# Patient Record
Sex: Female | Born: 1950
Health system: Southern US, Community
[De-identification: ages and names within clinical notes are randomized; demographics above are authoritative.]

## PROBLEM LIST (undated history)

## (undated) DIAGNOSIS — IMO0001 Reserved for inherently not codable concepts without codable children: Secondary | ICD-10-CM

## (undated) DIAGNOSIS — K5792 Diverticulitis of intestine, part unspecified, without perforation or abscess without bleeding: Secondary | ICD-10-CM

## (undated) DIAGNOSIS — M545 Low back pain, unspecified: Secondary | ICD-10-CM

## (undated) DIAGNOSIS — C9590 Leukemia, unspecified not having achieved remission: Secondary | ICD-10-CM

## (undated) DIAGNOSIS — H3530 Unspecified macular degeneration: Secondary | ICD-10-CM

## (undated) HISTORY — PX: BUNIONECTOMY: SHX129

## (undated) HISTORY — DX: Diverticulitis of intestine, part unspecified, without perforation or abscess without bleeding: K57.92

## (undated) HISTORY — DX: Unspecified macular degeneration: H35.30

## (undated) HISTORY — PX: TONSILLECTOMY: SUR1361

## (undated) HISTORY — PX: OTHER SURGICAL HISTORY: SHX169

## (undated) HISTORY — DX: Reserved for inherently not codable concepts without codable children: IMO0001

---

## 1998-02-24 DIAGNOSIS — IMO0001 Reserved for inherently not codable concepts without codable children: Secondary | ICD-10-CM

## 1998-02-24 HISTORY — DX: Reserved for inherently not codable concepts without codable children: IMO0001

## 1998-06-26 ENCOUNTER — Ambulatory Visit (HOSPITAL_COMMUNITY): Admission: RE | Admit: 1998-06-26 | Discharge: 1998-06-26 | Payer: Self-pay | Admitting: Gynecology

## 1999-03-27 ENCOUNTER — Encounter: Payer: Self-pay | Admitting: Family Medicine

## 1999-03-27 ENCOUNTER — Encounter: Admission: RE | Admit: 1999-03-27 | Discharge: 1999-03-27 | Payer: Self-pay | Admitting: Family Medicine

## 1999-04-04 ENCOUNTER — Other Ambulatory Visit: Admission: RE | Admit: 1999-04-04 | Discharge: 1999-04-04 | Payer: Self-pay | Admitting: Gynecology

## 2000-02-26 ENCOUNTER — Other Ambulatory Visit: Admission: RE | Admit: 2000-02-26 | Discharge: 2000-02-26 | Payer: Self-pay | Admitting: *Deleted

## 2000-03-30 ENCOUNTER — Encounter: Payer: Self-pay | Admitting: Gynecology

## 2000-03-30 ENCOUNTER — Encounter: Admission: RE | Admit: 2000-03-30 | Discharge: 2000-03-30 | Payer: Self-pay | Admitting: Gynecology

## 2000-04-08 ENCOUNTER — Other Ambulatory Visit: Admission: RE | Admit: 2000-04-08 | Discharge: 2000-04-08 | Payer: Self-pay | Admitting: Gynecology

## 2001-01-14 ENCOUNTER — Encounter: Payer: Self-pay | Admitting: Gynecology

## 2001-01-14 ENCOUNTER — Encounter: Admission: RE | Admit: 2001-01-14 | Discharge: 2001-01-14 | Payer: Self-pay | Admitting: Gynecology

## 2001-02-09 ENCOUNTER — Encounter: Payer: Self-pay | Admitting: Gastroenterology

## 2001-02-09 ENCOUNTER — Encounter: Admission: RE | Admit: 2001-02-09 | Discharge: 2001-02-09 | Payer: Self-pay | Admitting: Gastroenterology

## 2001-03-03 ENCOUNTER — Ambulatory Visit (HOSPITAL_COMMUNITY): Admission: RE | Admit: 2001-03-03 | Discharge: 2001-03-03 | Payer: Self-pay | Admitting: Gastroenterology

## 2001-03-03 ENCOUNTER — Encounter (INDEPENDENT_AMBULATORY_CARE_PROVIDER_SITE_OTHER): Payer: Self-pay | Admitting: Specialist

## 2001-06-01 ENCOUNTER — Encounter: Payer: Self-pay | Admitting: Family Medicine

## 2001-06-01 ENCOUNTER — Encounter: Admission: RE | Admit: 2001-06-01 | Discharge: 2001-06-01 | Payer: Self-pay | Admitting: Family Medicine

## 2002-04-18 ENCOUNTER — Other Ambulatory Visit: Admission: RE | Admit: 2002-04-18 | Discharge: 2002-04-18 | Payer: Self-pay | Admitting: Gynecology

## 2002-07-15 ENCOUNTER — Encounter: Payer: Self-pay | Admitting: Emergency Medicine

## 2002-07-15 ENCOUNTER — Encounter (INDEPENDENT_AMBULATORY_CARE_PROVIDER_SITE_OTHER): Payer: Self-pay | Admitting: Specialist

## 2002-07-15 ENCOUNTER — Inpatient Hospital Stay (HOSPITAL_COMMUNITY): Admission: EM | Admit: 2002-07-15 | Discharge: 2002-07-17 | Payer: Self-pay | Admitting: Emergency Medicine

## 2002-07-15 ENCOUNTER — Encounter (INDEPENDENT_AMBULATORY_CARE_PROVIDER_SITE_OTHER): Payer: Self-pay | Admitting: Emergency Medicine

## 2002-07-16 ENCOUNTER — Encounter: Payer: Self-pay | Admitting: Oncology

## 2003-01-12 ENCOUNTER — Encounter: Admission: RE | Admit: 2003-01-12 | Discharge: 2003-01-12 | Payer: Self-pay | Admitting: Gynecology

## 2003-07-06 ENCOUNTER — Other Ambulatory Visit: Admission: RE | Admit: 2003-07-06 | Discharge: 2003-07-06 | Payer: Self-pay | Admitting: Gynecology

## 2004-03-11 ENCOUNTER — Ambulatory Visit: Payer: Self-pay | Admitting: Oncology

## 2004-05-07 ENCOUNTER — Encounter: Admission: RE | Admit: 2004-05-07 | Discharge: 2004-05-07 | Payer: Self-pay | Admitting: Gynecology

## 2004-06-11 ENCOUNTER — Ambulatory Visit: Payer: Self-pay | Admitting: Oncology

## 2004-07-15 ENCOUNTER — Other Ambulatory Visit: Admission: RE | Admit: 2004-07-15 | Discharge: 2004-07-15 | Payer: Self-pay | Admitting: Gynecology

## 2004-09-06 ENCOUNTER — Ambulatory Visit: Payer: Self-pay | Admitting: Oncology

## 2005-04-18 ENCOUNTER — Ambulatory Visit: Payer: Self-pay | Admitting: Oncology

## 2005-05-16 ENCOUNTER — Encounter: Admission: RE | Admit: 2005-05-16 | Discharge: 2005-05-16 | Payer: Self-pay | Admitting: Oncology

## 2005-07-11 ENCOUNTER — Ambulatory Visit: Payer: Self-pay | Admitting: Oncology

## 2005-07-15 LAB — CBC WITH DIFFERENTIAL/PLATELET
Eosinophils Absolute: 0 10*3/uL (ref 0.0–0.5)
HCT: 40.2 % (ref 34.8–46.6)
LYMPH%: 87.3 % — ABNORMAL HIGH (ref 14.0–48.0)
MCV: 88.3 fL (ref 81.0–101.0)
MONO%: 3.3 % (ref 0.0–13.0)
NEUT#: 3.3 10*3/uL (ref 1.5–6.5)
NEUT%: 4.6 % — ABNORMAL LOW (ref 39.6–76.8)
Platelets: 206 10*3/uL (ref 145–400)
RBC: 4.56 10*6/uL (ref 3.70–5.32)

## 2005-07-17 ENCOUNTER — Other Ambulatory Visit: Admission: RE | Admit: 2005-07-17 | Discharge: 2005-07-17 | Payer: Self-pay | Admitting: Gynecology

## 2005-07-17 LAB — COMPREHENSIVE METABOLIC PANEL
ALT: 17 U/L (ref 0–40)
BUN: 17 mg/dL (ref 6–23)
CO2: 25 mEq/L (ref 19–32)
Calcium: 9.8 mg/dL (ref 8.4–10.5)
Chloride: 103 mEq/L (ref 96–112)
Creatinine, Ser: 0.9 mg/dL (ref 0.4–1.2)
Glucose, Bld: 136 mg/dL — ABNORMAL HIGH (ref 70–99)
Total Bilirubin: 0.4 mg/dL (ref 0.3–1.2)

## 2005-07-17 LAB — IMMUNOFIXATION ELECTROPHORESIS
IgA: 69 mg/dL (ref 68–378)
IgG (Immunoglobin G), Serum: 826 mg/dL (ref 694–1618)
IgM, Serum: 64 mg/dL (ref 60–263)
Total Protein, Serum Electrophoresis: 7.1 g/dL (ref 6.0–8.3)

## 2005-07-17 LAB — BETA 2 MICROGLOBULIN, SERUM: Beta-2 Microglobulin: 2.54 mg/L — ABNORMAL HIGH (ref 1.01–1.73)

## 2006-01-09 ENCOUNTER — Ambulatory Visit: Payer: Self-pay | Admitting: Oncology

## 2006-01-13 LAB — CBC WITH DIFFERENTIAL/PLATELET
Basophils Absolute: 0.6 10*3/uL — ABNORMAL HIGH (ref 0.0–0.1)
Eosinophils Absolute: 0.4 10*3/uL (ref 0.0–0.5)
HCT: 40 % (ref 34.8–46.6)
HGB: 12.9 g/dL (ref 11.6–15.9)
LYMPH%: 88.1 % — ABNORMAL HIGH (ref 14.0–48.0)
MCHC: 32.3 g/dL (ref 32.0–36.0)
MONO#: 4.1 10*3/uL — ABNORMAL HIGH (ref 0.1–0.9)
NEUT%: 6.8 % — ABNORMAL LOW (ref 39.6–76.8)
Platelets: 220 10*3/uL (ref 145–400)
WBC: 100.5 10*3/uL (ref 3.9–10.0)

## 2006-01-15 LAB — COMPREHENSIVE METABOLIC PANEL
Albumin: 4.7 g/dL (ref 3.5–5.2)
BUN: 14 mg/dL (ref 6–23)
CO2: 29 mEq/L (ref 19–32)
Calcium: 10.1 mg/dL (ref 8.4–10.5)
Chloride: 100 mEq/L (ref 96–112)
Creatinine, Ser: 0.74 mg/dL (ref 0.40–1.20)
Glucose, Bld: 89 mg/dL (ref 70–99)

## 2006-01-15 LAB — BETA 2 MICROGLOBULIN, SERUM: Beta-2 Microglobulin: 2.14 mg/L — ABNORMAL HIGH (ref 1.01–1.73)

## 2006-01-15 LAB — IMMUNOFIXATION ELECTROPHORESIS
IgA: 61 mg/dL — ABNORMAL LOW (ref 68–378)
IgG (Immunoglobin G), Serum: 887 mg/dL (ref 694–1618)

## 2006-04-16 ENCOUNTER — Ambulatory Visit: Payer: Self-pay | Admitting: Oncology

## 2006-04-21 LAB — MANUAL DIFFERENTIAL
Band Neutrophils: 0 % (ref 0–10)
Basophil: 0 % (ref 0–2)
EOS: 0 % (ref 0–7)
LYMPH: 95 % — ABNORMAL HIGH (ref 14–49)
MONO: 2 % (ref 0–14)
PLT EST: ADEQUATE
RBC Comments: NORMAL
nRBC: 0 % (ref 0–0)

## 2006-04-21 LAB — CHCC SMEAR

## 2006-04-21 LAB — CBC WITH DIFFERENTIAL/PLATELET
HCT: 37.4 % (ref 34.8–46.6)
MCH: 29.1 pg (ref 26.0–34.0)
MCHC: 32.9 g/dL (ref 32.0–36.0)
Platelets: 178 10*3/uL (ref 145–400)

## 2006-06-02 ENCOUNTER — Encounter: Admission: RE | Admit: 2006-06-02 | Discharge: 2006-06-02 | Payer: Self-pay | Admitting: Gynecology

## 2006-07-06 ENCOUNTER — Ambulatory Visit: Payer: Self-pay | Admitting: Oncology

## 2006-07-06 LAB — CBC WITH DIFFERENTIAL/PLATELET
BASO%: 0.8 % (ref 0.0–2.0)
EOS%: 0 % (ref 0.0–7.0)
MCH: 28.8 pg (ref 26.0–34.0)
MCHC: 32.8 g/dL (ref 32.0–36.0)
MONO%: 2.2 % (ref 0.0–13.0)
RDW: 12.1 % (ref 11.3–14.5)
lymph#: 97.1 10*3/uL — ABNORMAL HIGH (ref 0.9–3.3)

## 2006-07-06 LAB — MORPHOLOGY

## 2006-07-07 LAB — COMPREHENSIVE METABOLIC PANEL
ALT: 22 U/L (ref 0–35)
Alkaline Phosphatase: 89 U/L (ref 39–117)
Sodium: 139 mEq/L (ref 135–145)
Total Bilirubin: 0.3 mg/dL (ref 0.3–1.2)
Total Protein: 6.4 g/dL (ref 6.0–8.3)

## 2006-09-14 ENCOUNTER — Ambulatory Visit: Payer: Self-pay | Admitting: Oncology

## 2006-09-14 LAB — CBC WITH DIFFERENTIAL/PLATELET
Basophils Absolute: 4.7 10*3/uL — ABNORMAL HIGH (ref 0.0–0.1)
EOS%: 0.2 % (ref 0.0–7.0)
Eosinophils Absolute: 0.3 10*3/uL (ref 0.0–0.5)
HCT: 36.1 % (ref 34.8–46.6)
HGB: 12 g/dL (ref 11.6–15.9)
MCH: 28 pg (ref 26.0–34.0)
MCV: 84.5 fL (ref 81.0–101.0)
NEUT#: 9.1 10*3/uL — ABNORMAL HIGH (ref 1.5–6.5)
NEUT%: 6.2 % — ABNORMAL LOW (ref 39.6–76.8)
lymph#: 132 10*3/uL — ABNORMAL HIGH (ref 0.9–3.3)

## 2006-10-22 LAB — MANUAL DIFFERENTIAL
ALC: 140.4 10*3/uL — ABNORMAL HIGH (ref 0.9–3.3)
ANC (CHCC manual diff): 7.9 10*3/uL — ABNORMAL HIGH (ref 1.5–6.5)
Basophil: 0 % (ref 0–2)
Blasts: 0 % (ref 0–0)
LYMPH: 89 % — ABNORMAL HIGH (ref 14–49)
Metamyelocytes: 0 % (ref 0–0)
Myelocytes: 0 % (ref 0–0)
PROMYELO: 0 % (ref 0–0)
Variant Lymph: 0 % (ref 0–0)

## 2006-10-22 LAB — CBC WITH DIFFERENTIAL/PLATELET
HCT: 37.7 % (ref 34.8–46.6)
MCH: 28.6 pg (ref 26.0–34.0)
MCHC: 32.2 g/dL (ref 32.0–36.0)
MCV: 88.7 fL (ref 81.0–101.0)

## 2006-10-27 LAB — COMPREHENSIVE METABOLIC PANEL
Albumin: 4.6 g/dL (ref 3.5–5.2)
Alkaline Phosphatase: 95 U/L (ref 39–117)
BUN: 12 mg/dL (ref 6–23)
CO2: 26 mEq/L (ref 19–32)
Calcium: 9.2 mg/dL (ref 8.4–10.5)
Chloride: 105 mEq/L (ref 96–112)
Glucose, Bld: 77 mg/dL (ref 70–99)
Potassium: 4.2 mEq/L (ref 3.5–5.3)
Sodium: 141 mEq/L (ref 135–145)
Total Protein: 6.8 g/dL (ref 6.0–8.3)

## 2006-10-27 LAB — IMMUNOFIXATION ELECTROPHORESIS
IgA: 73 mg/dL (ref 68–378)
IgG (Immunoglobin G), Serum: 793 mg/dL (ref 694–1618)
Total Protein, Serum Electrophoresis: 6.8 g/dL (ref 6.0–8.3)

## 2006-10-27 LAB — BETA 2 MICROGLOBULIN, SERUM: Beta-2 Microglobulin: 1.8 mg/L — ABNORMAL HIGH (ref 1.01–1.73)

## 2006-10-28 ENCOUNTER — Other Ambulatory Visit: Admission: RE | Admit: 2006-10-28 | Discharge: 2006-10-28 | Payer: Self-pay | Admitting: Gynecology

## 2006-11-02 ENCOUNTER — Ambulatory Visit: Payer: Self-pay | Admitting: Oncology

## 2006-11-02 LAB — MANUAL DIFFERENTIAL
ALC: 141.1 10*3/uL — ABNORMAL HIGH (ref 0.9–3.3)
EOS: 0 % (ref 0–7)
LYMPH: 96 % — ABNORMAL HIGH (ref 14–49)
MONO: 2 % (ref 0–14)
Metamyelocytes: 0 % (ref 0–0)
Other Cell: 0 % (ref 0–0)
PLT EST: ADEQUATE
Variant Lymph: 0 % (ref 0–0)

## 2006-11-02 LAB — CBC WITH DIFFERENTIAL/PLATELET
HCT: 37.4 % (ref 34.8–46.6)
MCHC: 32.7 g/dL (ref 32.0–36.0)
MCV: 83.3 fL (ref 81.0–101.0)
Platelets: 193 10*3/uL (ref 145–400)

## 2006-11-06 LAB — MANUAL DIFFERENTIAL
ALC: 58.7 10*3/uL — ABNORMAL HIGH (ref 0.9–3.3)
ANC (CHCC manual diff): 3.9 10*3/uL (ref 1.5–6.5)
Band Neutrophils: 0 % (ref 0–10)
Blasts: 0 % (ref 0–0)
LYMPH: 90 % — ABNORMAL HIGH (ref 14–49)
Metamyelocytes: 0 % (ref 0–0)
PROMYELO: 0 % (ref 0–0)
RBC Comments: NORMAL
Variant Lymph: 0 % (ref 0–0)

## 2006-11-06 LAB — CBC WITH DIFFERENTIAL/PLATELET
HCT: 38.6 % (ref 34.8–46.6)
HGB: 13.1 g/dL (ref 11.6–15.9)
MCHC: 33.8 g/dL (ref 32.0–36.0)

## 2006-11-16 LAB — CBC WITH DIFFERENTIAL/PLATELET
Basophils Absolute: 0.4 10*3/uL — ABNORMAL HIGH (ref 0.0–0.1)
Eosinophils Absolute: 0.2 10*3/uL (ref 0.0–0.5)
HCT: 38.2 % (ref 34.8–46.6)
LYMPH%: 74 % — ABNORMAL HIGH (ref 14.0–48.0)
MCV: 84.1 fL (ref 81.0–101.0)
MONO%: 2.8 % (ref 0.0–13.0)
NEUT#: 5 10*3/uL (ref 1.5–6.5)
NEUT%: 20.6 % — ABNORMAL LOW (ref 39.6–76.8)
Platelets: 198 10*3/uL (ref 145–400)
RBC: 4.54 10*6/uL (ref 3.70–5.32)

## 2006-11-23 LAB — CBC WITH DIFFERENTIAL/PLATELET
BASO%: 2.3 % — ABNORMAL HIGH (ref 0.0–2.0)
EOS%: 1.6 % (ref 0.0–7.0)
LYMPH%: 69.2 % — ABNORMAL HIGH (ref 14.0–48.0)
MCH: 29.5 pg (ref 26.0–34.0)
MCHC: 34.4 g/dL (ref 32.0–36.0)
MCV: 85.7 fL (ref 81.0–101.0)
MONO%: 3.7 % (ref 0.0–13.0)
NEUT%: 23.2 % — ABNORMAL LOW (ref 39.6–76.8)
Platelets: 174 10*3/uL (ref 145–400)
RBC: 4.23 10*6/uL (ref 3.70–5.32)
WBC: 11.7 10*3/uL — ABNORMAL HIGH (ref 3.9–10.0)

## 2006-11-23 LAB — TECHNOLOGIST REVIEW

## 2006-11-30 LAB — CBC WITH DIFFERENTIAL/PLATELET
BASO%: 1.7 % (ref 0.0–2.0)
LYMPH%: 63.4 % — ABNORMAL HIGH (ref 14.0–48.0)
MCHC: 34.2 g/dL (ref 32.0–36.0)
MONO#: 0.6 10*3/uL (ref 0.1–0.9)
Platelets: 171 10*3/uL (ref 145–400)
RBC: 4.64 10*6/uL (ref 3.70–5.32)
WBC: 11.6 10*3/uL — ABNORMAL HIGH (ref 3.9–10.0)
lymph#: 7.4 10*3/uL — ABNORMAL HIGH (ref 0.9–3.3)

## 2006-12-07 LAB — CBC WITH DIFFERENTIAL/PLATELET
BASO%: 2.1 % — ABNORMAL HIGH (ref 0.0–2.0)
HCT: 40.8 % (ref 34.8–46.6)
MCHC: 34.8 g/dL (ref 32.0–36.0)
MONO#: 0.8 10*3/uL (ref 0.1–0.9)
NEUT%: 39.2 % — ABNORMAL LOW (ref 39.6–76.8)
WBC: 8.6 10*3/uL (ref 3.9–10.0)
lymph#: 4.1 10*3/uL — ABNORMAL HIGH (ref 0.9–3.3)

## 2007-03-29 ENCOUNTER — Ambulatory Visit: Payer: Self-pay | Admitting: Oncology

## 2007-03-31 LAB — CBC WITH DIFFERENTIAL/PLATELET
BASO%: 0.6 % (ref 0.0–2.0)
Eosinophils Absolute: 0.1 10*3/uL (ref 0.0–0.5)
MONO#: 0.5 10*3/uL (ref 0.1–0.9)
NEUT#: 4.8 10*3/uL (ref 1.5–6.5)
RBC: 4.92 10*6/uL (ref 3.70–5.32)
RDW: 11.9 % (ref 11.3–14.5)
WBC: 9.9 10*3/uL (ref 3.9–10.0)
lymph#: 4.3 10*3/uL — ABNORMAL HIGH (ref 0.9–3.3)

## 2007-04-01 LAB — COMPREHENSIVE METABOLIC PANEL
Alkaline Phosphatase: 66 U/L (ref 39–117)
Glucose, Bld: 92 mg/dL (ref 70–99)
Sodium: 140 mEq/L (ref 135–145)
Total Bilirubin: 0.5 mg/dL (ref 0.3–1.2)
Total Protein: 6.6 g/dL (ref 6.0–8.3)

## 2007-07-01 ENCOUNTER — Ambulatory Visit: Payer: Self-pay | Admitting: Oncology

## 2007-07-05 LAB — CBC WITH DIFFERENTIAL/PLATELET
Basophils Absolute: 0 10*3/uL (ref 0.0–0.1)
EOS%: 1.8 % (ref 0.0–7.0)
HCT: 38.9 % (ref 34.8–46.6)
HGB: 13.3 g/dL (ref 11.6–15.9)
LYMPH%: 50 % — ABNORMAL HIGH (ref 14.0–48.0)
MCH: 28.2 pg (ref 26.0–34.0)
NEUT%: 47.1 % (ref 39.6–76.8)
Platelets: 216 10*3/uL (ref 145–400)
lymph#: 5 10*3/uL — ABNORMAL HIGH (ref 0.9–3.3)

## 2007-07-05 LAB — MORPHOLOGY: PLT EST: ADEQUATE

## 2007-07-06 LAB — LACTATE DEHYDROGENASE: LDH: 134 U/L (ref 94–250)

## 2007-07-06 LAB — COMPREHENSIVE METABOLIC PANEL
AST: 17 U/L (ref 0–37)
BUN: 11 mg/dL (ref 6–23)
CO2: 26 mEq/L (ref 19–32)
Calcium: 9 mg/dL (ref 8.4–10.5)
Chloride: 104 mEq/L (ref 96–112)
Creatinine, Ser: 0.89 mg/dL (ref 0.40–1.20)
Glucose, Bld: 67 mg/dL — ABNORMAL LOW (ref 70–99)

## 2007-07-06 LAB — BETA 2 MICROGLOBULIN, SERUM: Beta-2 Microglobulin: 1.56 mg/L (ref 1.01–1.73)

## 2007-07-07 ENCOUNTER — Encounter: Admission: RE | Admit: 2007-07-07 | Discharge: 2007-07-07 | Payer: Self-pay | Admitting: Gynecology

## 2007-08-19 ENCOUNTER — Ambulatory Visit: Payer: Self-pay | Admitting: Oncology

## 2007-08-23 LAB — CBC WITH DIFFERENTIAL/PLATELET
BASO%: 0.6 % (ref 0.0–2.0)
Basophils Absolute: 0.1 10*3/uL (ref 0.0–0.1)
Eosinophils Absolute: 0.1 10*3/uL (ref 0.0–0.5)
HCT: 37.4 % (ref 34.8–46.6)
HGB: 13 g/dL (ref 11.6–15.9)
LYMPH%: 46.5 % (ref 14.0–48.0)
MONO#: 0.4 10*3/uL (ref 0.1–0.9)
NEUT#: 4.7 10*3/uL (ref 1.5–6.5)
NEUT%: 47.6 % (ref 39.6–76.8)
Platelets: 223 10*3/uL (ref 145–400)
WBC: 9.9 10*3/uL (ref 3.9–10.0)
lymph#: 4.6 10*3/uL — ABNORMAL HIGH (ref 0.9–3.3)

## 2007-09-28 LAB — CBC WITH DIFFERENTIAL/PLATELET
BASO%: 1 % (ref 0.0–2.0)
EOS%: 0.5 % (ref 0.0–7.0)
HCT: 39 % (ref 34.8–46.6)
LYMPH%: 42.8 % (ref 14.0–48.0)
MCH: 28.4 pg (ref 26.0–34.0)
MCHC: 34.1 g/dL (ref 32.0–36.0)
NEUT%: 52.4 % (ref 39.6–76.8)
lymph#: 4.8 10*3/uL — ABNORMAL HIGH (ref 0.9–3.3)

## 2007-09-29 LAB — COMPREHENSIVE METABOLIC PANEL
ALT: 19 U/L (ref 0–35)
AST: 20 U/L (ref 0–37)
Creatinine, Ser: 0.77 mg/dL (ref 0.40–1.20)
Total Bilirubin: 0.6 mg/dL (ref 0.3–1.2)

## 2007-09-29 LAB — URIC ACID: Uric Acid, Serum: 5.3 mg/dL (ref 2.4–7.0)

## 2007-10-05 ENCOUNTER — Ambulatory Visit: Payer: Self-pay | Admitting: Oncology

## 2008-01-03 ENCOUNTER — Ambulatory Visit: Payer: Self-pay | Admitting: Oncology

## 2008-01-05 LAB — CBC WITH DIFFERENTIAL/PLATELET
BASO%: 0.5 % (ref 0.0–2.0)
HCT: 41.1 % (ref 34.8–46.6)
MCHC: 33.4 g/dL (ref 32.0–36.0)
MONO#: 0.8 10*3/uL (ref 0.1–0.9)
NEUT%: 30 % — ABNORMAL LOW (ref 39.6–76.8)
RBC: 4.91 10*6/uL (ref 3.70–5.32)
WBC: 16.5 10*3/uL — ABNORMAL HIGH (ref 3.9–10.0)
lymph#: 10.6 10*3/uL — ABNORMAL HIGH (ref 0.9–3.3)

## 2008-01-05 LAB — MORPHOLOGY: PLT EST: ADEQUATE

## 2008-01-06 LAB — COMPREHENSIVE METABOLIC PANEL
Albumin: 4.5 g/dL (ref 3.5–5.2)
Alkaline Phosphatase: 68 U/L (ref 39–117)
CO2: 26 mEq/L (ref 19–32)
Chloride: 103 mEq/L (ref 96–112)
Glucose, Bld: 86 mg/dL (ref 70–99)
Potassium: 4.3 mEq/L (ref 3.5–5.3)
Sodium: 139 mEq/L (ref 135–145)
Total Protein: 6.7 g/dL (ref 6.0–8.3)

## 2008-01-06 LAB — BETA 2 MICROGLOBULIN, SERUM: Beta-2 Microglobulin: 1.59 mg/L (ref 1.01–1.73)

## 2008-03-28 ENCOUNTER — Ambulatory Visit: Payer: Self-pay | Admitting: Oncology

## 2008-03-30 LAB — CBC WITH DIFFERENTIAL/PLATELET
Basophils Absolute: 0.2 10*3/uL — ABNORMAL HIGH (ref 0.0–0.1)
EOS%: 1.2 % (ref 0.0–7.0)
Eosinophils Absolute: 0.3 10*3/uL (ref 0.0–0.5)
HGB: 13.2 g/dL (ref 11.6–15.9)
LYMPH%: 73.6 % — ABNORMAL HIGH (ref 14.0–48.0)
MCH: 28.7 pg (ref 26.0–34.0)
MCV: 84.3 fL (ref 81.0–101.0)
MONO%: 3.5 % (ref 0.0–13.0)
NEUT#: 4.4 10*3/uL (ref 1.5–6.5)
Platelets: 187 10*3/uL (ref 145–400)
RDW: 12.2 % (ref 11.3–14.5)

## 2008-03-30 LAB — COMPREHENSIVE METABOLIC PANEL
Alkaline Phosphatase: 71 U/L (ref 39–117)
BUN: 13 mg/dL (ref 6–23)
CO2: 29 mEq/L (ref 19–32)
Creatinine, Ser: 0.77 mg/dL (ref 0.40–1.20)
Glucose, Bld: 69 mg/dL — ABNORMAL LOW (ref 70–99)
Total Bilirubin: 0.4 mg/dL (ref 0.3–1.2)

## 2008-03-30 LAB — LACTATE DEHYDROGENASE: LDH: 135 U/L (ref 94–250)

## 2008-09-08 ENCOUNTER — Ambulatory Visit: Payer: Self-pay | Admitting: Oncology

## 2008-09-12 LAB — CBC WITH DIFFERENTIAL/PLATELET
Basophils Absolute: 0.3 10*3/uL — ABNORMAL HIGH (ref 0.0–0.1)
Eosinophils Absolute: 0.2 10*3/uL (ref 0.0–0.5)
HCT: 41.8 % (ref 34.8–46.6)
LYMPH%: 82.7 % — ABNORMAL HIGH (ref 14.0–49.7)
MCHC: 33.7 g/dL (ref 31.5–36.0)
MONO#: 0.9 10*3/uL (ref 0.1–0.9)
NEUT#: 5 10*3/uL (ref 1.5–6.5)
NEUT%: 13.8 % — ABNORMAL LOW (ref 38.4–76.8)
Platelets: 227 10*3/uL (ref 145–400)
WBC: 36.7 10*3/uL — ABNORMAL HIGH (ref 3.9–10.3)

## 2008-09-12 LAB — COMPREHENSIVE METABOLIC PANEL
BUN: 8 mg/dL (ref 6–23)
CO2: 30 mEq/L (ref 19–32)
Creatinine, Ser: 0.89 mg/dL (ref 0.40–1.20)
Glucose, Bld: 99 mg/dL (ref 70–99)
Total Bilirubin: 0.7 mg/dL (ref 0.3–1.2)

## 2008-09-12 LAB — LACTATE DEHYDROGENASE: LDH: 165 U/L (ref 94–250)

## 2008-11-13 ENCOUNTER — Emergency Department (HOSPITAL_COMMUNITY): Admission: EM | Admit: 2008-11-13 | Discharge: 2008-11-14 | Payer: Self-pay | Admitting: Emergency Medicine

## 2008-12-18 ENCOUNTER — Ambulatory Visit: Payer: Self-pay | Admitting: Oncology

## 2008-12-20 LAB — CBC WITH DIFFERENTIAL/PLATELET
Basophils Absolute: 0.2 10*3/uL — ABNORMAL HIGH (ref 0.0–0.1)
EOS%: 0.4 % (ref 0.0–7.0)
HGB: 13.3 g/dL (ref 11.6–15.9)
MCH: 28.9 pg (ref 25.1–34.0)
MCV: 88.7 fL (ref 79.5–101.0)
MONO%: 3.2 % (ref 0.0–14.0)
RBC: 4.59 10*6/uL (ref 3.70–5.45)
RDW: 12.5 % (ref 11.2–14.5)

## 2008-12-20 LAB — MORPHOLOGY

## 2009-03-22 ENCOUNTER — Ambulatory Visit: Payer: Self-pay | Admitting: Oncology

## 2009-03-22 LAB — CBC WITH DIFFERENTIAL/PLATELET
BASO%: 2 % (ref 0.0–2.0)
EOS%: 0.5 % (ref 0.0–7.0)
HGB: 13 g/dL (ref 11.6–15.9)
LYMPH%: 91 % — ABNORMAL HIGH (ref 14.0–49.7)
MCH: 29.5 pg (ref 25.1–34.0)
MCV: 89.4 fL (ref 79.5–101.0)
MONO#: 0.2 10*3/uL (ref 0.1–0.9)
NEUT#: 4.3 10*3/uL (ref 1.5–6.5)
WBC: 67.9 10*3/uL (ref 3.9–10.3)

## 2009-03-22 LAB — MORPHOLOGY
PLT EST: ADEQUATE
RBC Comments: NORMAL

## 2009-03-23 LAB — COMPREHENSIVE METABOLIC PANEL
ALT: 20 U/L (ref 0–35)
AST: 24 U/L (ref 0–37)
Albumin: 4.4 g/dL (ref 3.5–5.2)
Alkaline Phosphatase: 89 U/L (ref 39–117)
CO2: 28 mEq/L (ref 19–32)
Chloride: 103 mEq/L (ref 96–112)
Total Bilirubin: 0.3 mg/dL (ref 0.3–1.2)
Total Protein: 6.5 g/dL (ref 6.0–8.3)

## 2009-03-23 LAB — BETA 2 MICROGLOBULIN, SERUM: Beta-2 Microglobulin: 2.81 mg/L — ABNORMAL HIGH (ref 1.01–1.73)

## 2009-06-25 ENCOUNTER — Ambulatory Visit: Payer: Self-pay | Admitting: Oncology

## 2009-06-26 LAB — MANUAL DIFFERENTIAL
ANC (CHCC manual diff): 11.7 10*3/uL — ABNORMAL HIGH (ref 1.5–6.5)
EOS: 2 % (ref 0–7)
MONO: 2 % (ref 0–14)
PLT EST: ADEQUATE
PROMYELO: 0 % (ref 0–0)
Variant Lymph: 0 % (ref 0–0)
nRBC: 0 % (ref 0–0)

## 2009-06-26 LAB — CHCC SMEAR

## 2009-06-26 LAB — CBC WITH DIFFERENTIAL/PLATELET
HCT: 38.4 % (ref 34.8–46.6)
HGB: 12.5 g/dL (ref 11.6–15.9)
MCH: 28.8 pg (ref 25.1–34.0)
MCHC: 32.5 g/dL (ref 31.5–36.0)
MCV: 88.8 fL (ref 79.5–101.0)
RBC: 4.33 10*6/uL (ref 3.70–5.45)

## 2009-09-28 ENCOUNTER — Ambulatory Visit: Payer: Self-pay | Admitting: Oncology

## 2009-10-02 LAB — CBC WITH DIFFERENTIAL/PLATELET
EOS%: 0.4 % (ref 0.0–7.0)
Eosinophils Absolute: 0.4 10*3/uL (ref 0.0–0.5)
MCH: 28.9 pg (ref 25.1–34.0)
MCV: 89.6 fL (ref 79.5–101.0)
MONO#: 2.2 10*3/uL — ABNORMAL HIGH (ref 0.1–0.9)
NEUT#: 8.7 10*3/uL — ABNORMAL HIGH (ref 1.5–6.5)
NEUT%: 7 % — ABNORMAL LOW (ref 38.4–76.8)
Platelets: 254 10*3/uL (ref 145–400)
RDW: 12.8 % (ref 11.2–14.5)
lymph#: 111.9 10*3/uL — ABNORMAL HIGH (ref 0.9–3.3)

## 2009-10-02 LAB — MORPHOLOGY

## 2009-10-04 LAB — COMPREHENSIVE METABOLIC PANEL
AST: 20 U/L (ref 0–37)
Alkaline Phosphatase: 99 U/L (ref 39–117)
BUN: 18 mg/dL (ref 6–23)
CO2: 19 mEq/L (ref 19–32)
Calcium: 10.1 mg/dL (ref 8.4–10.5)
Creatinine, Ser: 0.91 mg/dL (ref 0.40–1.20)
Glucose, Bld: 89 mg/dL (ref 70–99)

## 2009-10-04 LAB — LACTATE DEHYDROGENASE: LDH: 217 U/L (ref 94–250)

## 2009-10-04 LAB — IMMUNOFIXATION ELECTROPHORESIS: IgA: 69 mg/dL (ref 68–378)

## 2009-11-20 ENCOUNTER — Ambulatory Visit: Payer: Self-pay | Admitting: Oncology

## 2009-11-20 LAB — CBC WITH DIFFERENTIAL/PLATELET
Eosinophils Absolute: 0.5 10*3/uL (ref 0.0–0.5)
HCT: 39.2 % (ref 34.8–46.6)
MONO#: 2.1 10*3/uL — ABNORMAL HIGH (ref 0.1–0.9)
MONO%: 1.8 % (ref 0.0–14.0)
NEUT#: 6.5 10*3/uL (ref 1.5–6.5)
Platelets: 197 10*3/uL (ref 145–400)
RBC: 4.4 10*6/uL (ref 3.70–5.45)
RDW: 12.3 % (ref 11.2–14.5)
lymph#: 105.9 10*3/uL — ABNORMAL HIGH (ref 0.9–3.3)

## 2009-11-20 LAB — MORPHOLOGY

## 2009-11-20 LAB — CHCC SMEAR

## 2010-01-04 ENCOUNTER — Ambulatory Visit: Payer: Self-pay | Admitting: Oncology

## 2010-01-08 LAB — CBC WITH DIFFERENTIAL/PLATELET
HCT: 37.8 % (ref 34.8–46.6)
LYMPH%: 87.6 % — ABNORMAL HIGH (ref 14.0–49.7)
MCV: 89 fL (ref 79.5–101.0)
MONO#: 0.3 10*3/uL (ref 0.1–0.9)
MONO%: 0.3 % (ref 0.0–14.0)
NEUT#: 13.1 10*3/uL — ABNORMAL HIGH (ref 1.5–6.5)
NEUT%: 11.2 % — ABNORMAL LOW (ref 38.4–76.8)
RBC: 4.24 10*6/uL (ref 3.70–5.45)
RDW: 12.4 % (ref 11.2–14.5)

## 2010-01-08 LAB — TECHNOLOGIST REVIEW

## 2010-02-11 ENCOUNTER — Ambulatory Visit: Payer: Self-pay | Admitting: Oncology

## 2010-02-12 LAB — CBC WITH DIFFERENTIAL/PLATELET
BASO%: 0.4 % (ref 0.0–2.0)
Basophils Absolute: 0.6 10*3/uL — ABNORMAL HIGH (ref 0.0–0.1)
EOS%: 0 % (ref 0.0–7.0)
Eosinophils Absolute: 0 10*3/uL (ref 0.0–0.5)
LYMPH%: 89.5 % — ABNORMAL HIGH (ref 14.0–49.7)
MCHC: 32.5 g/dL (ref 31.5–36.0)
MCV: 90.2 fL (ref 79.5–101.0)
MONO#: 5.2 10*3/uL — ABNORMAL HIGH (ref 0.1–0.9)
NEUT#: 8 10*3/uL — ABNORMAL HIGH (ref 1.5–6.5)
lymph#: 116.3 10*3/uL — ABNORMAL HIGH (ref 0.9–3.3)

## 2010-02-12 LAB — TECHNOLOGIST REVIEW

## 2010-02-26 ENCOUNTER — Ambulatory Visit (HOSPITAL_BASED_OUTPATIENT_CLINIC_OR_DEPARTMENT_OTHER): Admitting: Oncology

## 2010-02-26 LAB — CHCC SMEAR

## 2010-02-26 LAB — MANUAL DIFFERENTIAL
ALC: 120.2 10*3/uL — ABNORMAL HIGH (ref 0.9–3.3)
SEG: 6 % — ABNORMAL LOW (ref 38–77)

## 2010-02-26 LAB — CBC WITH DIFFERENTIAL/PLATELET
HCT: 38.4 % (ref 34.8–46.6)
Platelets: 201 10*3/uL (ref 145–400)

## 2010-02-27 LAB — COMPREHENSIVE METABOLIC PANEL
ALT: 13 U/L (ref 0–35)
AST: 21 U/L (ref 0–37)
Albumin: 4.3 g/dL (ref 3.5–5.2)
Alkaline Phosphatase: 85 U/L (ref 39–117)
BUN: 13 mg/dL (ref 6–23)
CO2: 24 mEq/L (ref 19–32)
Calcium: 10 mg/dL (ref 8.4–10.5)
Chloride: 104 mEq/L (ref 96–112)
Creatinine, Ser: 0.91 mg/dL (ref 0.40–1.20)
Glucose, Bld: 97 mg/dL (ref 70–99)
Potassium: 3.9 mEq/L (ref 3.5–5.3)
Sodium: 140 mEq/L (ref 135–145)
Total Bilirubin: 0.4 mg/dL (ref 0.3–1.2)
Total Protein: 6.4 g/dL (ref 6.0–8.3)

## 2010-02-27 LAB — BETA 2 MICROGLOBULIN, SERUM: Beta-2 Microglobulin: 2.87 mg/L — ABNORMAL HIGH (ref 1.01–1.73)

## 2010-02-27 LAB — LACTATE DEHYDROGENASE: LDH: 164 U/L (ref 94–250)

## 2010-03-06 LAB — MANUAL DIFFERENTIAL
ALC: 37.8 10*3/uL — ABNORMAL HIGH (ref 0.9–3.3)
ANC (CHCC manual diff): 4.4 10*3/uL (ref 1.5–6.5)
Band Neutrophils: 0 % (ref 0–10)
Basophil: 1 % (ref 0–2)
Blasts: 0 % (ref 0–0)
EOS: 2 % (ref 0–7)
LYMPH: 87 % — ABNORMAL HIGH (ref 14–49)
MONO: 0 % (ref 0–14)
Metamyelocytes: 0 % (ref 0–0)
Myelocytes: 0 % (ref 0–0)
Other Cell: 0 % (ref 0–0)
PLT EST: ADEQUATE
PROMYELO: 0 % (ref 0–0)
SEG: 10 % — ABNORMAL LOW (ref 38–77)
Variant Lymph: 0 % (ref 0–0)
nRBC: 0 % (ref 0–0)

## 2010-03-06 LAB — CBC WITH DIFFERENTIAL/PLATELET
HCT: 37.8 % (ref 34.8–46.6)
HGB: 12.5 g/dL (ref 11.6–15.9)
MCH: 28.6 pg (ref 25.1–34.0)
MCHC: 33 g/dL (ref 31.5–36.0)
MCV: 86.5 fL (ref 79.5–101.0)
Platelets: 156 10*3/uL (ref 145–400)
RBC: 4.38 10*6/uL (ref 3.70–5.45)
RDW: 12.9 % (ref 11.2–14.5)
WBC: 43.5 10*3/uL — ABNORMAL HIGH (ref 3.9–10.3)

## 2010-03-13 LAB — CBC WITH DIFFERENTIAL/PLATELET
BASO%: 0.6 % (ref 0.0–2.0)
Basophils Absolute: 0.1 10*3/uL (ref 0.0–0.1)
EOS%: 1.4 % (ref 0.0–7.0)
Eosinophils Absolute: 0.3 10*3/uL (ref 0.0–0.5)
HCT: 38.2 % (ref 34.8–46.6)
HGB: 12.8 g/dL (ref 11.6–15.9)
LYMPH%: 72.7 % — ABNORMAL HIGH (ref 14.0–49.7)
MCH: 29 pg (ref 25.1–34.0)
MCHC: 33.5 g/dL (ref 31.5–36.0)
MCV: 86.7 fL (ref 79.5–101.0)
MONO#: 0.6 10*3/uL (ref 0.1–0.9)
MONO%: 2.9 % (ref 0.0–14.0)
NEUT#: 4.6 10*3/uL (ref 1.5–6.5)
NEUT%: 22.4 % — ABNORMAL LOW (ref 38.4–76.8)
Platelets: 178 10*3/uL (ref 145–400)
RBC: 4.41 10*6/uL (ref 3.70–5.45)
RDW: 12.9 % (ref 11.2–14.5)
WBC: 20.7 10*3/uL — ABNORMAL HIGH (ref 3.9–10.3)
lymph#: 15 10*3/uL — ABNORMAL HIGH (ref 0.9–3.3)

## 2010-03-13 LAB — TECHNOLOGIST REVIEW

## 2010-03-17 ENCOUNTER — Encounter: Payer: Self-pay | Admitting: Oncology

## 2010-03-20 LAB — CBC WITH DIFFERENTIAL/PLATELET
BASO%: 0.4 % (ref 0.0–2.0)
Eosinophils Absolute: 0.2 10*3/uL (ref 0.0–0.5)
HCT: 39.4 % (ref 34.8–46.6)
HGB: 12.8 g/dL (ref 11.6–15.9)
LYMPH%: 60.9 % — ABNORMAL HIGH (ref 14.0–49.7)
MCH: 28.4 pg (ref 25.1–34.0)
MCHC: 32.5 g/dL (ref 31.5–36.0)
MONO#: 0.9 10*3/uL (ref 0.1–0.9)
MONO%: 8.3 % (ref 0.0–14.0)
NEUT#: 3 10*3/uL (ref 1.5–6.5)
NEUT%: 28.8 % — ABNORMAL LOW (ref 38.4–76.8)
Platelets: 164 10*3/uL (ref 145–400)
WBC: 10.3 10*3/uL (ref 3.9–10.3)
lymph#: 6.3 10*3/uL — ABNORMAL HIGH (ref 0.9–3.3)
nRBC: 0 % (ref 0–0)

## 2010-03-27 ENCOUNTER — Ambulatory Visit (HOSPITAL_BASED_OUTPATIENT_CLINIC_OR_DEPARTMENT_OTHER): Admitting: Oncology

## 2010-03-27 DIAGNOSIS — Z5112 Encounter for antineoplastic immunotherapy: Secondary | ICD-10-CM

## 2010-03-27 DIAGNOSIS — C9110 Chronic lymphocytic leukemia of B-cell type not having achieved remission: Secondary | ICD-10-CM

## 2010-03-27 DIAGNOSIS — C8599 Non-Hodgkin lymphoma, unspecified, extranodal and solid organ sites: Secondary | ICD-10-CM

## 2010-03-27 LAB — CBC WITH DIFFERENTIAL/PLATELET
Eosinophils Absolute: 0.1 10*3/uL (ref 0.0–0.5)
LYMPH%: 44.1 % (ref 14.0–49.7)
MCV: 86.6 fL (ref 79.5–101.0)
Platelets: 157 10*3/uL (ref 145–400)
RBC: 4.47 10*6/uL (ref 3.70–5.45)
RDW: 13 % (ref 11.2–14.5)
WBC: 7.3 10*3/uL (ref 3.9–10.3)
nRBC: 0 % (ref 0–0)

## 2010-04-03 ENCOUNTER — Encounter (HOSPITAL_BASED_OUTPATIENT_CLINIC_OR_DEPARTMENT_OTHER): Admitting: Oncology

## 2010-04-03 DIAGNOSIS — C9110 Chronic lymphocytic leukemia of B-cell type not having achieved remission: Secondary | ICD-10-CM

## 2010-04-03 DIAGNOSIS — C8599 Non-Hodgkin lymphoma, unspecified, extranodal and solid organ sites: Secondary | ICD-10-CM

## 2010-04-03 DIAGNOSIS — Z5112 Encounter for antineoplastic immunotherapy: Secondary | ICD-10-CM

## 2010-04-10 ENCOUNTER — Other Ambulatory Visit: Payer: Self-pay | Admitting: Oncology

## 2010-04-10 ENCOUNTER — Encounter (HOSPITAL_BASED_OUTPATIENT_CLINIC_OR_DEPARTMENT_OTHER): Admitting: Oncology

## 2010-04-10 DIAGNOSIS — C9110 Chronic lymphocytic leukemia of B-cell type not having achieved remission: Secondary | ICD-10-CM

## 2010-04-10 DIAGNOSIS — C8599 Non-Hodgkin lymphoma, unspecified, extranodal and solid organ sites: Secondary | ICD-10-CM

## 2010-04-10 DIAGNOSIS — Z5112 Encounter for antineoplastic immunotherapy: Secondary | ICD-10-CM

## 2010-04-10 LAB — CBC WITH DIFFERENTIAL/PLATELET
BASO%: 0.5 % (ref 0.0–2.0)
EOS%: 1.1 % (ref 0.0–7.0)
Eosinophils Absolute: 0.1 10*3/uL (ref 0.0–0.5)
HCT: 38.5 % (ref 34.8–46.6)
MCH: 28.8 pg (ref 25.1–34.0)
MCHC: 33.4 g/dL (ref 31.5–36.0)
MCV: 86.2 fL (ref 79.5–101.0)
MONO#: 0.6 10*3/uL (ref 0.1–0.9)
NEUT#: 4.2 10*3/uL (ref 1.5–6.5)
NEUT%: 47.9 % (ref 38.4–76.8)
WBC: 8.8 10*3/uL (ref 3.9–10.3)

## 2010-04-17 ENCOUNTER — Encounter (HOSPITAL_BASED_OUTPATIENT_CLINIC_OR_DEPARTMENT_OTHER): Admitting: Oncology

## 2010-04-17 ENCOUNTER — Other Ambulatory Visit: Payer: Self-pay | Admitting: Oncology

## 2010-04-17 DIAGNOSIS — C9110 Chronic lymphocytic leukemia of B-cell type not having achieved remission: Secondary | ICD-10-CM

## 2010-04-17 DIAGNOSIS — Z5112 Encounter for antineoplastic immunotherapy: Secondary | ICD-10-CM

## 2010-04-17 DIAGNOSIS — C8599 Non-Hodgkin lymphoma, unspecified, extranodal and solid organ sites: Secondary | ICD-10-CM

## 2010-04-17 LAB — CBC WITH DIFFERENTIAL/PLATELET
BASO%: 1 % (ref 0.0–2.0)
Basophils Absolute: 0.1 10*3/uL (ref 0.0–0.1)
Eosinophils Absolute: 0.1 10*3/uL (ref 0.0–0.5)
HGB: 13.1 g/dL (ref 11.6–15.9)
LYMPH%: 50 % — ABNORMAL HIGH (ref 14.0–49.7)
MONO%: 9.4 % (ref 0.0–14.0)
RDW: 12.7 % (ref 11.2–14.5)
nRBC: 0 % (ref 0–0)

## 2010-05-31 LAB — DIFFERENTIAL
Basophils Absolute: 0 10*3/uL (ref 0.0–0.1)
Basophils Relative: 0 % (ref 0–1)
Lymphocytes Relative: 82 % — ABNORMAL HIGH (ref 12–46)
Lymphs Abs: 34.6 10*3/uL — ABNORMAL HIGH (ref 0.7–4.0)
Monocytes Absolute: 1.7 10*3/uL — ABNORMAL HIGH (ref 0.1–1.0)
Neutro Abs: 5.5 10*3/uL (ref 1.7–7.7)

## 2010-05-31 LAB — BASIC METABOLIC PANEL
BUN: 9 mg/dL (ref 6–23)
CO2: 29 mEq/L (ref 19–32)
Chloride: 104 mEq/L (ref 96–112)
GFR calc Af Amer: >60 mL/min (ref >60)
GFR calc non Af Amer: >60 mL/min (ref >60)
Glucose, Bld: 83 mg/dL (ref 70–99)
Potassium: 3.8 mEq/L (ref 3.5–5.1)
Sodium: 139 mEq/L (ref 135–145)

## 2010-05-31 LAB — CBC
HCT: 40.9 % (ref 36.0–46.0)
Hemoglobin: 13.4 g/dL (ref 12.0–15.0)
WBC: 42.2 10*3/uL — ABNORMAL HIGH (ref 4.0–10.5)

## 2010-05-31 LAB — RAPID URINE DRUG SCREEN, HOSP PERFORMED
Amphetamines: NOT DETECTED
Barbiturates: NOT DETECTED
Cocaine: NOT DETECTED
Tetrahydrocannabinol: NOT DETECTED

## 2010-05-31 LAB — ETHANOL: Alcohol, Ethyl (B): 43 mg/dL — ABNORMAL HIGH (ref 0–10)

## 2010-06-17 ENCOUNTER — Encounter (HOSPITAL_BASED_OUTPATIENT_CLINIC_OR_DEPARTMENT_OTHER): Admitting: Oncology

## 2010-06-17 ENCOUNTER — Other Ambulatory Visit: Payer: Self-pay | Admitting: Oncology

## 2010-06-17 DIAGNOSIS — Z5112 Encounter for antineoplastic immunotherapy: Secondary | ICD-10-CM

## 2010-06-17 DIAGNOSIS — C9110 Chronic lymphocytic leukemia of B-cell type not having achieved remission: Secondary | ICD-10-CM

## 2010-06-17 DIAGNOSIS — C8599 Non-Hodgkin lymphoma, unspecified, extranodal and solid organ sites: Secondary | ICD-10-CM

## 2010-06-17 LAB — MORPHOLOGY
PLT EST: ADEQUATE
RBC Comments: NORMAL

## 2010-06-17 LAB — CBC WITH DIFFERENTIAL/PLATELET
Basophils Absolute: 0.1 10*3/uL (ref 0.0–0.1)
Eosinophils Absolute: 0.1 10*3/uL (ref 0.0–0.5)
HGB: 14.1 g/dL (ref 11.6–15.9)
LYMPH%: 50.1 % — ABNORMAL HIGH (ref 14.0–49.7)
MCV: 85.5 fL (ref 79.5–101.0)
MONO#: 0.5 10*3/uL (ref 0.1–0.9)
NEUT#: 4.3 10*3/uL (ref 1.5–6.5)
Platelets: 205 10*3/uL (ref 145–400)
RBC: 4.96 10*6/uL (ref 3.70–5.45)
WBC: 9.9 10*3/uL (ref 3.9–10.3)

## 2010-06-17 LAB — CHCC SMEAR

## 2010-07-12 NOTE — H&P (Signed)
NAMECHANEKA, TREFZ                       ACCOUNT NO.:  [id number]   MEDICAL RECORD NO.:  [id number]                   PATIENT TYPE:  EMS   LOCATION:  ED                                   FACILITY:  [facility]   PHYSICIAN:  Leighton Roach. Truett Perna, M.D.              DATE OF BIRTH:  May 14, 1950   DATE OF ADMISSION:  07/15/2002  DATE OF DISCHARGE:                                HISTORY & PHYSICAL   PATIENT IDENTIFICATION:  Ms. 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 She is now admitted with a headache, skin lesions, and  a CSF leukocytosis.   HISTORY OF PRESENT ILLNESS:  Ms. Mayfield states that beginning on May 19  she noted the onset of chills, bone/muscle aches, and a bite over the  upper right buttock. She also developed a consistent headache.   The symptoms persisted today and she saw Dr. Darnelle Catalan in the office. He felt  that the lesion at the buttock was likely related to a spider bite. She  was referred to the emergency room for a lumbar puncture.   She states that she feels better today than she did yesterday. In addition  to the headache, she noted neck pain yesterday. She denies fever.   She is not aware of a specific insect exposure. She has been exposed to a  grandchild who was recently diagnosed with roseola.   PAST MEDICAL HISTORY:  1. Degenerative arthritis of the low spine.  2. G3, P3.   PAST SURGICAL HISTORY:  Status post uterine ablation procedure several years  ago.   ALLERGIES:  PENICILLIN.   CURRENT MEDICATIONS:  1. Vitamin E daily.  2. Calcium daily.  3. Estrogen patch.  4. Centrum daily.  5. Allegra D.   FAMILY HISTORY:  Noncontributory.   SOCIAL HISTORY:  She lives with her husband in [place]. She is a  homemaker and keeps her grandchildren.   REVIEW OF SYMPTOMS:  CONSTITUTIONAL:  No fever or night sweats.  RESPIRATORY:  She has developed a cough over the past few days. She denies  dyspnea. GU:  Negative.  GI:  Negative.   SKIN:  As per HPI.  She has noted  several bites over the face today. NEUROLOGIC:  Negative aside from the  headache.   PHYSICAL EXAMINATION:  VITAL SIGNS:  Temperature 98.9, pressure 102/48,  pulse 72, respirations 20. Oxygen saturation is 98% on room air.  HEENT:  Sclera anicteric. Oropharynx without thrush or ulcerations.  NECK:  Without palpable masses. The neck is supple.  LUNGS:  Clear.  CARDIAC:  Regular rate and rhythm. No gallops.  ABDOMEN:  Soft. Nontender. I could not feel the liver or spleen.  LYMPH NODES:  There is a 2 cm right posterior cervical node, less than 1 cm  left posterior cervical nodes, and a 1-2 cm right inguinal node.  EXTREMITIES:  No edema.  SKIN:  There is an erythematous indurated area  measuring approximately 2-3  cm in transverse dimension at the right upper buttock with central 1/2 cm  purpuric/black eschar. There are 2-3 raised erythematous 2 cm lesions over  the face.   LABORATORY DATA:  Hemoglobin 12.8, platelets 169,000, white count 84.7, ANC  7.6, potassium 3.8, BUN 7, 0.8, total protein 6.7, albumin 4, bilirubin 0.6,  CSF tube #1, 20 red cells, 1500 white cells, 96% lymphocytes. Tube #4, 2 red  cells, 1150 white cells, 99% lymphocytes.   IMPRESSION/PLAN:  Ms. Masters is a 60 year old with chronic lymphocytic  leukemia.   She presents today with a headache, a right upper buttock skin lesion, and  there is a CSF lymphocytosis.   She is immunocompromised secondary to the CLL. It is not clear whether the  skin lesions and CSF lymphocytosis are related.   The differential diagnosis includes skin lesions secondary to an insect  bite, or an infectious process.   She will be admitted for broad spectrum intravenous antibiotics with  ceftriaxone. We will obtain a CSF culture for bacteria, virus, and we will  obtain a CSF cryptococcal antigen. We will send the CSF for cytology.   We will consider an infectious disease consult and skin biopsy  pending her  status over the next few days.                                               Leighton Roach Truett Perna, M.D.    GBS/MEDQ  D:  07/15/2002  T:  07/15/2002  Job:  [id number]   cc:   Valentino Hue. Magrinat, M.D.  [address] [facility]  [place]  [place] [address]  Fax: [phone]   Brett Canales A. Cleta Alberts, M.D.  [address]  [place]  [place] [address]  Fax: [phone]

## 2010-07-12 NOTE — Procedures (Signed)
[facility]  Patient:    Rachel [name], Rachel [name] Visit Number: [id number] MRN: [id number]          Service Type: END Location: ENDO Attending Physician:  Rich Brave Dictated by:   Florencia Reasons, M.D. Proc. Date: 03/03/01 Admit Date:  03/03/2001   CC:         Valentino Hue. Magrinat, M.D.  Chales Salmon. Abigail Miyamoto, M.D.   Procedure Report  PROCEDURE:  Upper endoscopy with biopsies.  INDICATIONS:  60 year old female with intermittent diarrhea and periodic epigastric pain.  FINDINGS:  Normal exam.  PROCEDURE:  The nature, purpose, and risks of the procedure had been discussed with the patient and provided written consent.  SEDATION:  Fentanyl 37.5 mcg and Versed 5 mg IV without arrhythmias or desaturation.  PROCEDURE:  The Olympus video endoscope was passed under direct vision.  The vocal cords looked normal.  The esophagus was readily entered and had entirely normal mucosa without evidence of reflux esophagitis, Barretts esophagus, varices, infection, or neoplasia.  No free reflux, hiatal hernia, ring, or stricture were appreciated.  The stomach was entered.  It contained no significant residual and had unremarkable mucosa with perhaps just a little bit of gastric mucosal atrophy in the fundus of the stomach.  No gastritis, erosions, ulcers, polyps, masses, or masses were observed.  Retroflexed viewing showed a normal cardia without evidence of hiatal hernia.  The pylorus, duodenal bulb, and second duodenum looked normal.  Duodenal biopsies were obtained prior to removal of the scope because of the history of intermittent loose stools.  The patient tolerated the procedure well, and there were no apparent complications.  IMPRESSION:  Normal upper endoscopy.  PLAN:  Await pathology on biopsies.  Proceed to colonoscopic evaluation. Dictated by:   Florencia Reasons, M.D. Attending Physician:  Rich Brave DD:  03/03/01 TD:   03/03/01 Job: [id number]

## 2010-07-12 NOTE — Op Note (Signed)
[facility]  Patient:    Rachel [name], Rachel [name] Visit Number: [id number] MRN: [id number]          Service Type: END Location: ENDO Attending Physician:  Rich Brave Dictated by:   Florencia Reasons, M.D. Proc. Date: 03/03/01 Admit Date:  03/03/2001   CC:         Valentino Hue. Magrinat, M.D.  Chales Salmon. Abigail Miyamoto, M.D.   Operative Report  PROCEDURE:  Colonoscopy with biopsies.  INDICATION:  This is a 59 year old for colon cancer screening who also has a history of intermittent loose stools. She has a history of CLL.  FINDINGS:  Normal exam except some right side diverticulosis.  DESCRIPTION OF PROCEDURE:  The nature, purpose, and risks of the procedure have been discussed with the patient who provided written consent. Sedation for this procedure and the upper endoscopy which preceded it, a total of Fentanyl 50 mcg and Versed 6 mg IV without arrhythmias or desaturation. The Olympus Pediatric adjustable tension video colonoscope was advanced to the terminal ileum without significant difficulty and pullback was then performed. The terminal ileum had been entered for about a distance of perhaps 15 cm. Several biopsies were obtained prior of it prior to pullback.  The quality of the prep in the colon was very good and it is felt that essentially all areas were well seen.  There was a little bit of right-sided colonic diverticulosis but this was otherwise a normal examination, without evidence of polyps, cancer, colitis, or vascular malformations. Retroflexion in the rectum was normal except for perhaps some small hypertrophied anal papillae.  Random mucosal biopsies of the colon were obtained during pullback.  The patient tolerated the procedure well and there were no apparent complications.  IMPRESSION:  Normal colonoscopy except some right side diverticulosis.  PLAN:  Await pathology on biopsies. Dictated by:   Florencia Reasons,  M.D. Attending Physician:  Rich Brave DD:  03/03/01 TD:  03/03/01 Job: [id number]

## 2010-07-12 NOTE — Discharge Summary (Signed)
Rachel [name], Rachel [name]                       ACCOUNT NO.:  [id number]   MEDICAL RECORD NO.:  [id number]                   PATIENT TYPE:  INP   LOCATION:  0284                                 FACILITY:  [facility]   PHYSICIAN:  Valentino Hue. Magrinat, M.D.            DATE OF BIRTH:  1950/11/13   DATE OF ADMISSION:  07/15/2002  DATE OF DISCHARGE:  07/17/2002                                 DISCHARGE SUMMARY   DISCHARGE DIAGNOSES:  1. Meningitis (?).  2. Chronic lymphoid leukemia.  3. Spider bite.   PROCEDURE:  1. Lumbar punctures.  2. MRI of the brain.  3. CT scan of the brain.   HOSPITAL COURSE:  The patient initially saw Dr. Earl Lites who felt she  might be having shingles in the setting of her CLL and sent her to our  office for further evaluation. We noted, what most likely is a spider bite  in the sacral area, but in addition the patient was having mild headaches  and mild neck stiffness, which could be consistent with meningitis.  Accordingly, she was sent to the emergency room where a lumbar puncture was  performed. This had a glucose of 40 with the lower normal being 43 and a  protein of 200 with the upper normal being 45. There were 20 red cells and  1500 white cells, 96% of which were lymphocytes, 4% were monocytes. There  were no organisms seen and culture remains negative at the time of  discharge.   The patient was admitted and started on Ceftriaxone, Diflucan and Acyclovir.  A CT of the brain was obtained which was unremarkable. MRI of the brain  showed subtle changes, possibly consistent with meningitis. The patient  never had a fever and her vital signs remained in the normal range. By the  time of discharge, she is ambulatory. Her headache is well controlled on  Tylenol. There is minimal photophobia, minimal to no neck stiffness and the  neurologic examination is nonfocal.   LABORATORY DATA:  Other lab work this admission includes a white cell count  of 84.7,  hemoglobin 12.8 and platelets 169,000, this being essentially  stable in this patient with known CLL. Her CMET was completely normal  including a creatinine of 0.7, normal liver function tests and normal  electrolytes.   A literature search found that lymphomatous meningitis secondary to CLL is  exceedingly rare. In patient's with early CLL, as in Rachel [name]'s case, I  could only find one case report. In addition, the differential between  infectious meningitis and CLL meningitis by cytology, is complex and at  least one article suggests that the only two cases suggestive of CLL  involvement were false positives.   At this point the, it is difficult for [place] to know whether what we are seeing  in terms of the lymphocytosis and the CSF is:  a.  Contamination at the time of the lumbar puncture from  a small amount of  blood.  b.  Pleocytosis secondary to viral meningitis, which is what I think it is.  c.  Pleocytosis secondary to her spider bite, which is quite close to her  spine at the midline sacral area.  d.  A true lymphomatous meningitis, which as noted above would be  exceedingly rare and indeed, a reportable case.   DISPOSITION:  With the patient improved, the plan is to send her home with  an additional five days of antibiotics and then to try to resolve this  issue, we will bring her peripheral count down with Rituxan and then repeat  a lumbar puncture. The patient is very agreeable to this but she is planning  to go to [place] between June 4th and June 11th as so, we will start her  treatment on June 11th.  Meanwhile, cytology remains pending at this point,  from the lumbar puncture.   DISCHARGE MEDICATIONS:  She will be on Diflucan 100 mg daily for five days  and Cipro 500 mg twice a day for five days and then any other medicines she  was on (multivitamins, etc) at her discretion. For pain, she will take  Tylenol or Motrin.   ACTIVITY:  Not restricted.   DIET:  Not  restricted.   WOUND CARE:  Not applicable.   SPECIAL INSTRUCTIONS:  She will call the office for fever, worsening  headache, or any other problems.   FOLLOW UP:  She will call the office for an appointment on August 05, 2002 and  she will take Rituxan the same day.                                               Valentino Hue. Magrinat, M.D.    Ronna Polio  D:  07/17/2002  T:  07/17/2002  Job:  [id number]   cc:   Brett Canales A. Cleta Alberts, M.D.  [address]  [place]  [place] [address]  Fax: [phone]

## 2010-09-02 ENCOUNTER — Encounter: Admitting: Oncology

## 2010-10-09 ENCOUNTER — Encounter (HOSPITAL_BASED_OUTPATIENT_CLINIC_OR_DEPARTMENT_OTHER): Admitting: Oncology

## 2010-10-09 ENCOUNTER — Other Ambulatory Visit: Payer: Self-pay | Admitting: Oncology

## 2010-10-09 DIAGNOSIS — C8599 Non-Hodgkin lymphoma, unspecified, extranodal and solid organ sites: Secondary | ICD-10-CM

## 2010-10-09 DIAGNOSIS — C9110 Chronic lymphocytic leukemia of B-cell type not having achieved remission: Secondary | ICD-10-CM

## 2010-10-09 DIAGNOSIS — Z5112 Encounter for antineoplastic immunotherapy: Secondary | ICD-10-CM

## 2010-10-09 LAB — CBC WITH DIFFERENTIAL/PLATELET
BASO%: 0.6 % (ref 0.0–2.0)
EOS%: 1.4 % (ref 0.0–7.0)
MCH: 29.9 pg (ref 25.1–34.0)
MCHC: 34.9 g/dL (ref 31.5–36.0)
MONO#: 0.3 10*3/uL (ref 0.1–0.9)
RDW: 12.4 % (ref 11.2–14.5)
WBC: 13.8 10*3/uL — ABNORMAL HIGH (ref 3.9–10.3)
lymph#: 8.7 10*3/uL — ABNORMAL HIGH (ref 0.9–3.3)

## 2010-10-09 LAB — MORPHOLOGY: RBC Comments: NORMAL

## 2010-10-09 LAB — CHCC SMEAR

## 2010-12-17 ENCOUNTER — Other Ambulatory Visit: Payer: Self-pay | Admitting: Oncology

## 2010-12-17 ENCOUNTER — Encounter (HOSPITAL_BASED_OUTPATIENT_CLINIC_OR_DEPARTMENT_OTHER): Admitting: Oncology

## 2010-12-17 DIAGNOSIS — C9110 Chronic lymphocytic leukemia of B-cell type not having achieved remission: Secondary | ICD-10-CM

## 2010-12-17 DIAGNOSIS — C8599 Non-Hodgkin lymphoma, unspecified, extranodal and solid organ sites: Secondary | ICD-10-CM

## 2010-12-17 DIAGNOSIS — Z5112 Encounter for antineoplastic immunotherapy: Secondary | ICD-10-CM

## 2010-12-17 LAB — CBC WITH DIFFERENTIAL/PLATELET
Basophils Absolute: 0.1 10*3/uL (ref 0.0–0.1)
Eosinophils Absolute: 0.2 10*3/uL (ref 0.0–0.5)
HCT: 40.6 % (ref 34.8–46.6)
HGB: 13.7 g/dL (ref 11.6–15.9)
MONO#: 1.2 10*3/uL — ABNORMAL HIGH (ref 0.1–0.9)
NEUT#: 4.9 10*3/uL (ref 1.5–6.5)
NEUT%: 27.3 % — ABNORMAL LOW (ref 38.4–76.8)
WBC: 17.8 10*3/uL — ABNORMAL HIGH (ref 3.9–10.3)
lymph#: 11.5 10*3/uL — ABNORMAL HIGH (ref 0.9–3.3)

## 2010-12-17 LAB — COMPREHENSIVE METABOLIC PANEL WITH GFR
ALT: 16 U/L (ref 0–35)
AST: 18 U/L (ref 0–37)
Albumin: 4.5 g/dL (ref 3.5–5.2)
Alkaline Phosphatase: 69 U/L (ref 39–117)
BUN: 14 mg/dL (ref 6–23)
CO2: 26 meq/L (ref 19–32)
Calcium: 9.3 mg/dL (ref 8.4–10.5)
Chloride: 103 meq/L (ref 96–112)
Creatinine, Ser: 0.94 mg/dL (ref 0.50–1.10)
Glucose, Bld: 100 mg/dL — ABNORMAL HIGH (ref 70–99)
Potassium: 4.1 meq/L (ref 3.5–5.3)
Sodium: 139 meq/L (ref 135–145)
Total Bilirubin: 0.4 mg/dL (ref 0.3–1.2)
Total Protein: 6.3 g/dL (ref 6.0–8.3)

## 2010-12-17 LAB — URIC ACID: Uric Acid, Serum: 5.1 mg/dL (ref 2.4–7.0)

## 2010-12-17 LAB — MORPHOLOGY
PLT EST: ADEQUATE
RBC Comments: NORMAL

## 2010-12-17 LAB — CHCC SMEAR

## 2011-02-06 ENCOUNTER — Other Ambulatory Visit: Payer: Self-pay

## 2011-02-06 ENCOUNTER — Telehealth: Payer: Self-pay

## 2011-02-06 DIAGNOSIS — C9110 Chronic lymphocytic leukemia of B-cell type not having achieved remission: Secondary | ICD-10-CM

## 2011-02-06 NOTE — Telephone Encounter (Signed)
Received call from pt requesting lab appt in January.   Per Mosaiq, pt should be scheduled for q54month labs.   Note sent to scheduling.  dph

## 2011-02-06 NOTE — Telephone Encounter (Signed)
Note to scheduling. dph

## 2011-02-10 ENCOUNTER — Telehealth: Payer: Self-pay | Admitting: Oncology

## 2011-02-10 NOTE — Telephone Encounter (Signed)
Talked to pt gave her appt for 03/10/11

## 2011-02-11 ENCOUNTER — Other Ambulatory Visit: Payer: Self-pay | Admitting: *Deleted

## 2011-02-11 DIAGNOSIS — C9110 Chronic lymphocytic leukemia of B-cell type not having achieved remission: Secondary | ICD-10-CM

## 2011-02-23 ENCOUNTER — Telehealth: Payer: Self-pay | Admitting: Oncology

## 2011-02-23 NOTE — Telephone Encounter (Signed)
S/w the pt and she is aware of her April 2013 appts

## 2011-03-10 ENCOUNTER — Other Ambulatory Visit (HOSPITAL_BASED_OUTPATIENT_CLINIC_OR_DEPARTMENT_OTHER): Admitting: Lab

## 2011-03-10 DIAGNOSIS — C9110 Chronic lymphocytic leukemia of B-cell type not having achieved remission: Secondary | ICD-10-CM

## 2011-03-10 LAB — CBC WITH DIFFERENTIAL/PLATELET
Basophils Absolute: 0.1 10*3/uL (ref 0.0–0.1)
EOS%: 0.9 % (ref 0.0–7.0)
HGB: 13.1 g/dL (ref 11.6–15.9)
MCH: 29.3 pg (ref 25.1–34.0)
MCV: 86.9 fL (ref 79.5–101.0)
MONO%: 1.1 % (ref 0.0–14.0)
RDW: 11.8 % (ref 11.2–14.5)

## 2011-03-10 LAB — MORPHOLOGY: PLT EST: ADEQUATE

## 2011-04-25 ENCOUNTER — Telehealth: Payer: Self-pay | Admitting: *Deleted

## 2011-04-25 NOTE — Telephone Encounter (Signed)
PT. IS IN [place] FOR A MONTH. WHAT SHOULD SHE DO? NOTIFIED DR.MAGRINAT'S NURSE, VAL DODD,RN. SHE WILL CALL PT.

## 2011-06-16 ENCOUNTER — Telehealth: Payer: Self-pay | Admitting: Oncology

## 2011-06-16 NOTE — Telephone Encounter (Signed)
Pt called in requesting to cancel her April appts due to dr Darnelle Catalan is not in the network. Per pt she has spoke with val regarding a referral. Per pt she will cb to r/s

## 2011-06-17 ENCOUNTER — Other Ambulatory Visit: Payer: Self-pay | Admitting: Lab

## 2011-06-17 ENCOUNTER — Ambulatory Visit: Payer: Self-pay | Admitting: Oncology

## 2011-07-03 ENCOUNTER — Ambulatory Visit: Admitting: Oncology

## 2011-07-03 ENCOUNTER — Other Ambulatory Visit: Admitting: Lab

## 2011-07-17 ENCOUNTER — Other Ambulatory Visit (HOSPITAL_BASED_OUTPATIENT_CLINIC_OR_DEPARTMENT_OTHER): Admitting: Lab

## 2011-07-17 ENCOUNTER — Ambulatory Visit (HOSPITAL_BASED_OUTPATIENT_CLINIC_OR_DEPARTMENT_OTHER): Admitting: Oncology

## 2011-07-17 ENCOUNTER — Telehealth: Payer: Self-pay | Admitting: Oncology

## 2011-07-17 VITALS — BP 106/70 | HR 80 | Temp 98.2°F | Ht 58.5 in | Wt 122.5 lb

## 2011-07-17 DIAGNOSIS — C9110 Chronic lymphocytic leukemia of B-cell type not having achieved remission: Secondary | ICD-10-CM

## 2011-07-17 DIAGNOSIS — C8599 Non-Hodgkin lymphoma, unspecified, extranodal and solid organ sites: Secondary | ICD-10-CM

## 2011-07-17 LAB — CBC WITH DIFFERENTIAL/PLATELET
BASO%: 0.8 % (ref 0.0–2.0)
EOS%: 0.6 % (ref 0.0–7.0)
HCT: 40 % (ref 34.8–46.6)
LYMPH%: 87.4 % — ABNORMAL HIGH (ref 14.0–49.7)
MCH: 28.9 pg (ref 25.1–34.0)
MCHC: 32.5 g/dL (ref 31.5–36.0)
MONO%: 0.8 % (ref 0.0–14.0)
NEUT%: 10.4 % — ABNORMAL LOW (ref 38.4–76.8)
Platelets: 217 10*3/uL (ref 145–400)
lymph#: 51.4 10*3/uL — ABNORMAL HIGH (ref 0.9–3.3)

## 2011-07-17 LAB — CHCC SMEAR

## 2011-07-17 NOTE — Telephone Encounter (Signed)
gve the pt her July 2013 appt calendar °

## 2011-07-17 NOTE — Progress Notes (Signed)
ID: Rachel [name]   DOB: July 27, 1950  MR#: [id number]  QIO#:[id number]  HISTORY OF PRESENT ILLNESS:  INTERVAL HISTORY: Rachel [name] returns today for followup of her chronic lymphoid leukemia. The family issues have come to a head and she and her husband are continuing to live in the same house but more as friends then as a married couple. She is very much a piece with this decision. She is still going to the gym regularly  REVIEW OF SYSTEMS: She is having problems with hot flashes him a little bit of a running nose, and once every month or so she has a day when she has a few loose bowel movements. She doesn't take anything to treat that, it resolves on its own. She does have some urine leakage symptoms which are not new, some anxiety and depression but, which is actually improved, and some easy bruising. Otherwise a detailed review of systems today was negative and in particular she gives me know "B." symptoms.  PAST MEDICAL HISTORY: No past medical history on file.  PAST SURGICAL HISTORY: No past surgical history on file.  FAMILY HISTORY No family history on file.  GYNECOLOGIC HISTORY:  SOCIAL HISTORY: Her husband Rachel [name] is retired from [employer]. Her son Rachel [name] lives in [place] and has 3 sons he works for a Education officer, environmental. Son 53 well lives in [place] has one child and another one on the way. Son Rachel [name] lives in [place] and has a daughter, as well as 2 sons by marriage.   ADVANCED DIRECTIVES:  HEALTH MAINTENANCE: History  Substance Use Topics  . Smoking status: Not on file  . Smokeless tobacco: Not on file  . Alcohol Use: Not on file     Colonoscopy: due  PAP: December 2012/ Rachel [name]  Bone density: Due  Lipid panel: per Dr Rachel [name]  Allergies not on file  No current outpatient prescriptions on file.    OBJECTIVE: Middle-aged white woman in no acute distress Filed Vitals:   07/17/11 1457  BP: 106/70  Pulse: 80  Temp: 98.2 F (36.8 C)     Body mass index is 25.17  kg/(m^2).    ECOG FS: 0  Sclerae unicteric Oropharynx clear She has minimal right cervical and left axillary adenopathy. Lungs no rales or rhonchi Heart regular rate and rhythm Abd benign, no organomegaly MSK no focal spinal tenderness, no peripheral edema Neuro: nonfocal   LAB RESULTS: Lab Results  Component Value Date   WBC 58.8* 07/17/2011   NEUTROABS 6.1 07/17/2011   HGB 13.0 07/17/2011   HCT 40.0 07/17/2011   MCV 89.0 07/17/2011   PLT 217 07/17/2011      Chemistry      Component Value Date/Time   NA 139 12/17/2010 1359   K 4.1 12/17/2010 1359   CL 103 12/17/2010 1359   CO2 26 12/17/2010 1359   BUN 14 12/17/2010 1359   CREATININE 0.94 12/17/2010 1359      Component Value Date/Time   CALCIUM 9.3 12/17/2010 1359   ALKPHOS 69 12/17/2010 1359   AST 18 12/17/2010 1359   ALT 16 12/17/2010 1359   BILITOT 0.4 12/17/2010 1359       No results found for this basename: LABCA2    No components found with this basename: LABCA125    No results found for this basename: INR:1;PROTIME:1 in the last 168 hours  Urinalysis No results found for this basename: colorurine, appearanceur, labspec, phurine, glucoseu, hgbur, bilirubinur, ketonesur, proteinur, urobilinogen, nitrite, leukocytesur    STUDIES:  Mammography 06/30/2011 showed some microcalcifications which are probably benign. They will be repeating a mammogram in 6 months.  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   PLAN: Her lymphocyte count has bumped up significantly in the last few months. We are going to follow in a little bit more closely, namely we will repeat lab work in 6 weeks and then she will see me again in 12 weeks. She has no anemia from the cytopenia or other symptoms that  might push [place] towards treatment at present, but I suspect she will may well require therapy before the end of the year.   Shaneese Tait C    07/17/2011

## 2011-07-29 ENCOUNTER — Telehealth: Payer: Self-pay | Admitting: *Deleted

## 2011-09-09 ENCOUNTER — Other Ambulatory Visit (HOSPITAL_BASED_OUTPATIENT_CLINIC_OR_DEPARTMENT_OTHER): Admitting: Lab

## 2011-09-09 ENCOUNTER — Ambulatory Visit (HOSPITAL_BASED_OUTPATIENT_CLINIC_OR_DEPARTMENT_OTHER): Admitting: Oncology

## 2011-09-09 VITALS — BP 98/63 | HR 59 | Temp 98.4°F | Ht 58.5 in | Wt 121.1 lb

## 2011-09-09 DIAGNOSIS — R51 Headache: Secondary | ICD-10-CM

## 2011-09-09 DIAGNOSIS — R599 Enlarged lymph nodes, unspecified: Secondary | ICD-10-CM

## 2011-09-09 DIAGNOSIS — C8599 Non-Hodgkin lymphoma, unspecified, extranodal and solid organ sites: Secondary | ICD-10-CM

## 2011-09-09 DIAGNOSIS — C9110 Chronic lymphocytic leukemia of B-cell type not having achieved remission: Secondary | ICD-10-CM

## 2011-09-09 LAB — CBC WITH DIFFERENTIAL/PLATELET
Basophils Absolute: 0.5 10*3/uL — ABNORMAL HIGH (ref 0.0–0.1)
EOS%: 0.5 % (ref 0.0–7.0)
Eosinophils Absolute: 0.3 10*3/uL (ref 0.0–0.5)
HGB: 12.7 g/dL (ref 11.6–15.9)
MCH: 28.2 pg (ref 25.1–34.0)
NEUT#: 6 10*3/uL (ref 1.5–6.5)
RBC: 4.5 10*6/uL (ref 3.70–5.45)
RDW: 12.5 % (ref 11.2–14.5)
lymph#: 67.8 10*3/uL — ABNORMAL HIGH (ref 0.9–3.3)

## 2011-09-09 MED ORDER — SUMATRIPTAN SUCCINATE 50 MG PO TABS: 50.0000 mg | ORAL_TABLET | ORAL | Status: DC | PRN

## 2011-09-09 NOTE — Progress Notes (Signed)
ID: Rachel [name]   DOB: [birth year]  MR#: [id number]  GMW#:[id number]  HISTORY OF PRESENT ILLNESS:  INTERVAL HISTORY: Rachel [name] returns today with her husband Rachel [name] for followup of her chronic lymphoid leukemia.   REVIEW OF SYSTEMS: She has been having fairly constant headaches. This started in the back of her neck and can go to the crown of her head. They're not accompanied by any visual changes, nausea or vomiting. Separately she is having more problems with hot flashes. She keeps an upset stomach. There has been no vomiting however. She describes herself as fatigue, but is able to get to the gym perhaps 4 times a week. She keeps a little bit of a runny nose, but she takes antihistamines daily of, which helps a little. She has diarrhea once or twice a week. She has some arthritic pain here and there but she describes herself is anxious and depressed. There have been no fevers, rash, or bleeding. A detailed review of systems was otherwise noncontributory.  PAST MEDICAL HISTORY: No past medical history on file.  PAST SURGICAL HISTORY: No past surgical history on file.  FAMILY HISTORY No family history on file.  GYNECOLOGIC HISTORY:  SOCIAL HISTORY: Her husband Rachel [name] is retired from [employer]. Her son Rachel [name] lives in [place] and has 3 sons; he works for a Education officer, environmental. A second son lives in [place] has two children. Son Rachel [name] lives in [place] and has a daughter, as well as 2 sons by marriage.   ADVANCED DIRECTIVES:  HEALTH MAINTENANCE: History  Substance Use Topics  . Smoking status: Not on file  . Smokeless tobacco: Not on file  . Alcohol Use: Not on file     Colonoscopy: due  PAP: December 2012/ [name]  Bone density: Due  Lipid panel: per Dr Zachery [name]  No Known Allergies  No current outpatient prescriptions on file.    OBJECTIVE: Middle-aged white woman who complains of a severe headache Filed Vitals:   09/09/11 1511  BP: 98/63  Pulse: 59  Temp: 98.4 F  (36.9 [name])     Body mass index is 24.88 kg/(m^2).    ECOG FS: 0  Sclerae unicteric Oropharynx clear She has mild right cervical and left axillary adenopathy. Lungs no rales or rhonchi Heart regular rate and rhythm Abd benign, no organomegaly MSK no focal spinal tenderness, no peripheral edema Neuro: nonfocal   LAB RESULTS: Lab Results  Component Value Date   WBC 75.2* 09/09/2011   NEUTROABS 6.0 09/09/2011   HGB 12.7 09/09/2011   HCT 40.0 09/09/2011   MCV 88.9 09/09/2011   PLT 192 09/09/2011      Chemistry      Component Value Date/Time   NA 139 12/17/2010 1359   K 4.1 12/17/2010 1359   CL 103 12/17/2010 1359   CO2 26 12/17/2010 1359   BUN 14 12/17/2010 1359   CREATININE 0.94 12/17/2010 1359      Component Value Date/Time   CALCIUM 9.3 12/17/2010 1359   ALKPHOS 69 12/17/2010 1359   AST 18 12/17/2010 1359   ALT 16 12/17/2010 1359   BILITOT 0.4 12/17/2010 1359       No results found for this basename: LABCA2    No components found with this basename: LABCA125    No results found for this basename: INR:1;PROTIME:1 in the last 168 hours  Urinalysis No results found for this basename: colorurine,  appearanceur,  labspec,  phurine,  glucoseu,  hgbur,  bilirubinur,  ketonesur,  proteinur,  urobilinogen,  nitrite,  leukocytesur    STUDIES: Mammography 06/30/2011 showed some microcalcifications which are probably benign. They will be repeating a mammogram in 6 months.  ASSESSMENT: 61 y.o.  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   PLAN: Her lymphocyte count continues to rise, but there is still no anemia or thrombocytopenia. Her adenopathy is mild to moderate. She has some fatigue but it is difficult to know whether this is due to her incredible amount of stress  of secondary to her marital issues or out to the lymphoma itself. I certainly think the headache is going to be related to the stress and not do anything else.  Rachel [name] wanted to know what to do. I suggested that they really needed to decide if they wanted to stay together as a couple or not. If they do, they have a lot of work to do and they had previously met with a psychiatrist in [place], Rachel [name], and perhaps they could start by going back there. Rachel [name] would prefer a woman counselor. Perhaps Rachel [name] works with a women counselors that she could see as well.  I did write a prescription for Imitrex for Rachel [name] and asked her to try it next time she has a headache. I also suggested she try yoga and other relatives station and stretching type exercises. If those strategies don't work and she can't make had way on the stress at home, then of I will be glad to refer her to the headache clinic.  We will recheck her labwork in 6 weeks and she will see me again in 12 my expectation is that sometime in the next few months we will have to start the anti-CD20 antibodies.   Rachel [name]    09/09/2011

## 2011-09-10 NOTE — Telephone Encounter (Signed)
No inquiry noted 

## 2011-09-11 LAB — PROTEIN ELECTROPHORESIS, SERUM
Albumin ELP: 68 % — ABNORMAL HIGH (ref 55.8–66.1)
Beta 2: 3.4 % (ref 3.2–6.5)
Beta Globulin: 6 % (ref 4.7–7.2)
Gamma Globulin: 9.5 % — ABNORMAL LOW (ref 11.1–18.8)

## 2011-09-11 LAB — COMPREHENSIVE METABOLIC PANEL
AST: 22 U/L (ref 0–37)
Albumin: 4.5 g/dL (ref 3.5–5.2)
BUN: 14 mg/dL (ref 6–23)
Calcium: 9.1 mg/dL (ref 8.4–10.5)
Chloride: 105 mEq/L (ref 96–112)
Potassium: 3.7 mEq/L (ref 3.5–5.3)
Sodium: 139 mEq/L (ref 135–145)
Total Protein: 6.3 g/dL (ref 6.0–8.3)

## 2011-10-09 ENCOUNTER — Other Ambulatory Visit (HOSPITAL_BASED_OUTPATIENT_CLINIC_OR_DEPARTMENT_OTHER): Admitting: Lab

## 2011-10-09 DIAGNOSIS — C9110 Chronic lymphocytic leukemia of B-cell type not having achieved remission: Secondary | ICD-10-CM

## 2011-10-09 LAB — CBC WITH DIFFERENTIAL/PLATELET
BASO%: 0.8 % (ref 0.0–2.0)
EOS%: 0.4 % (ref 0.0–7.0)
LYMPH%: 90.9 % — ABNORMAL HIGH (ref 14.0–49.7)
MCH: 28.6 pg (ref 25.1–34.0)
MCHC: 31.8 g/dL (ref 31.5–36.0)
MCV: 90.2 fL (ref 79.5–101.0)
MONO%: 0.5 % (ref 0.0–14.0)
NEUT#: 6.1 10*3/uL (ref 1.5–6.5)
Platelets: 203 10*3/uL (ref 145–400)
RBC: 4.36 10*6/uL (ref 3.70–5.45)
RDW: 12.4 % (ref 11.2–14.5)

## 2011-10-09 LAB — TECHNOLOGIST REVIEW

## 2011-10-22 ENCOUNTER — Telehealth: Payer: Self-pay | Admitting: *Deleted

## 2011-10-22 ENCOUNTER — Other Ambulatory Visit: Payer: Self-pay | Admitting: *Deleted

## 2011-10-22 NOTE — Telephone Encounter (Signed)
Per called to state she would like to proceed to have an appointment at headache clinic as discussed by MD at last visit.  This RN placed [facility] at [phone].  Onc Tx Schedule sent to scheduling.

## 2011-10-23 ENCOUNTER — Telehealth: Payer: Self-pay | Admitting: Oncology

## 2011-10-23 NOTE — Telephone Encounter (Signed)
S/w tina from the headache center and she has faxed over a referral form for [place] to fill out and once she receives the documents from medical records they will send [place] the information over with the pt's appt and they will call the pt with an appt. Called [phone].

## 2011-10-25 ENCOUNTER — Telehealth: Payer: Self-pay | Admitting: Oncology

## 2011-10-25 NOTE — Telephone Encounter (Signed)
gve the referral form to the desk nurse to fill out

## 2011-12-02 ENCOUNTER — Other Ambulatory Visit: Payer: Self-pay | Admitting: *Deleted

## 2011-12-02 DIAGNOSIS — C9110 Chronic lymphocytic leukemia of B-cell type not having achieved remission: Secondary | ICD-10-CM

## 2011-12-03 ENCOUNTER — Other Ambulatory Visit (HOSPITAL_BASED_OUTPATIENT_CLINIC_OR_DEPARTMENT_OTHER): Admitting: Lab

## 2011-12-03 DIAGNOSIS — C9110 Chronic lymphocytic leukemia of B-cell type not having achieved remission: Secondary | ICD-10-CM

## 2011-12-03 LAB — MORPHOLOGY: PLT EST: ADEQUATE

## 2011-12-03 LAB — CBC WITH DIFFERENTIAL/PLATELET
Eosinophils Absolute: 0.3 10*3/uL (ref 0.0–0.5)
MONO#: 0.8 10*3/uL (ref 0.1–0.9)
NEUT#: 6 10*3/uL (ref 1.5–6.5)
RBC: 4.17 10*6/uL (ref 3.70–5.45)
RDW: 12.9 % (ref 11.2–14.5)
WBC: 123 10*3/uL (ref 3.9–10.3)

## 2011-12-04 ENCOUNTER — Other Ambulatory Visit: Payer: Self-pay | Admitting: *Deleted

## 2011-12-04 NOTE — Progress Notes (Signed)
This RN received call from pt stating " I would like to go ahead and start treatment asap due to my white count going up so high ".  Per discussion pt stated she would prefer to be treated on Tuesdays but will receive 1st treatment on Wed 10/16 post MD visit.  Discussed above with MD who requested appt for treatment for Rituxin.  Onc Tx schedule sent to scheduling.

## 2011-12-05 ENCOUNTER — Telehealth: Payer: Self-pay | Admitting: Oncology

## 2011-12-05 ENCOUNTER — Telehealth: Payer: Self-pay | Admitting: *Deleted

## 2011-12-05 NOTE — Telephone Encounter (Signed)
Per staff message and POF I have scheduled appts.  JMW  

## 2011-12-05 NOTE — Telephone Encounter (Signed)
S/w the pt's husband and he is aware of the tx appt that has been added to her appt with dr Darnelle Catalan in oct.

## 2011-12-06 ENCOUNTER — Other Ambulatory Visit: Payer: Self-pay | Admitting: Oncology

## 2011-12-06 DIAGNOSIS — C9110 Chronic lymphocytic leukemia of B-cell type not having achieved remission: Secondary | ICD-10-CM

## 2011-12-10 ENCOUNTER — Ambulatory Visit (HOSPITAL_BASED_OUTPATIENT_CLINIC_OR_DEPARTMENT_OTHER): Admitting: Oncology

## 2011-12-10 ENCOUNTER — Telehealth: Payer: Self-pay | Admitting: Oncology

## 2011-12-10 ENCOUNTER — Telehealth: Payer: Self-pay | Admitting: *Deleted

## 2011-12-10 ENCOUNTER — Ambulatory Visit (HOSPITAL_BASED_OUTPATIENT_CLINIC_OR_DEPARTMENT_OTHER)

## 2011-12-10 ENCOUNTER — Other Ambulatory Visit: Admitting: Lab

## 2011-12-10 VITALS — BP 90/55 | HR 74 | Temp 98.0°F | Resp 20 | Ht 58.5 in | Wt 122.6 lb

## 2011-12-10 VITALS — BP 97/53 | HR 63 | Temp 98.5°F | Resp 24

## 2011-12-10 DIAGNOSIS — Z5112 Encounter for antineoplastic immunotherapy: Secondary | ICD-10-CM

## 2011-12-10 DIAGNOSIS — L299 Pruritus, unspecified: Secondary | ICD-10-CM

## 2011-12-10 DIAGNOSIS — C9110 Chronic lymphocytic leukemia of B-cell type not having achieved remission: Secondary | ICD-10-CM

## 2011-12-10 DIAGNOSIS — F341 Dysthymic disorder: Secondary | ICD-10-CM

## 2011-12-10 MED ORDER — SODIUM CHLORIDE 0.9 % IV SOLN
Freq: Once | INTRAVENOUS | Status: AC
  Administered 2011-12-10: 10:00:00 via INTRAVENOUS

## 2011-12-10 MED ORDER — ACYCLOVIR 400 MG PO TABS: 400.0000 mg | ORAL_TABLET | Freq: Two times a day (BID) | ORAL | Status: DC

## 2011-12-10 MED ORDER — ALLOPURINOL 300 MG PO TABS: 300.0000 mg | ORAL_TABLET | Freq: Every day | ORAL | Status: DC

## 2011-12-10 MED ORDER — LORAZEPAM 1 MG PO TABS
0.5000 mg | ORAL_TABLET | Freq: Once | ORAL | Status: AC
  Administered 2011-12-10: 0.5 mg via ORAL

## 2011-12-10 MED ORDER — DIPHENHYDRAMINE HCL 50 MG/ML IJ SOLN
25.0000 mg | Freq: Once | INTRAMUSCULAR | Status: AC | PRN
  Administered 2011-12-10: 25 mg via INTRAVENOUS

## 2011-12-10 MED ORDER — SODIUM CHLORIDE 0.45 % IV SOLN
INTRAVENOUS | Status: AC
  Administered 2011-12-10: 10:00:00 via INTRAVENOUS
  Filled 2011-12-10: qty 1000

## 2011-12-10 MED ORDER — DIPHENHYDRAMINE HCL 25 MG PO CAPS
25.0000 mg | ORAL_CAPSULE | Freq: Once | ORAL | Status: AC
  Administered 2011-12-10: 25 mg via ORAL

## 2011-12-10 MED ORDER — SODIUM CHLORIDE 0.9 % IV SOLN
300.0000 mg | Freq: Once | INTRAVENOUS | Status: AC
  Administered 2011-12-10: 300 mg via INTRAVENOUS
  Filled 2011-12-10: qty 15

## 2011-12-10 MED ORDER — ACETAMINOPHEN 500 MG PO TABS
1000.0000 mg | ORAL_TABLET | Freq: Once | ORAL | Status: AC
  Administered 2011-12-10: 1000 mg via ORAL

## 2011-12-10 MED ORDER — SULFAMETHOXAZOLE-TRIMETHOPRIM 800-160 MG PO TABS: 1.0000 | ORAL_TABLET | ORAL | Status: DC

## 2011-12-10 MED ORDER — METHYLPREDNISOLONE SODIUM SUCC 125 MG IJ SOLR
125.0000 mg | Freq: Once | INTRAMUSCULAR | Status: AC
  Administered 2011-12-10: 125 mg via INTRAVENOUS

## 2011-12-10 NOTE — Telephone Encounter (Signed)
Per patient request, I have moved all appt to Tuesday. JWM

## 2011-12-10 NOTE — Progress Notes (Signed)
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 She was alarmed by the bump in her total white cells, and of course there has also been a drop in her hemoglobin. She is here today to start her ofatumumab treatment.  REVIEW OF SYSTEMS: Family problems continue as before. As a result she is somewhat depressed, sleeps poorly, and feels more tired. Just a little bit of blurred vision, a sore throat, some sinus problems which she frequently gets this time of year, heartburn, occasional problems with loose bowel movements, easy bruising, rare headaches, and some forgetfulness and anxiety. Hot flashes are mild. She has not had drenching sweats, fevers, or rash. There has not been significant weight loss. A detailed review of systems is otherwise stable.  PAST MEDICAL HISTORY: No past medical history on file.  PAST SURGICAL HISTORY: No past surgical history on file.  FAMILY HISTORY No family history on file.  GYNECOLOGIC HISTORY:  SOCIAL HISTORY: Her husband [name] is retired from [employer]. Her son [name] lives in [place] and has 3 sons; he works for a Education officer, environmental. A second son lives in [place] has two children. Son [name] lives in [place] and has a daughter, as well as 2 sons by marriage.   ADVANCED DIRECTIVES:  HEALTH MAINTENANCE: History  Substance Use Topics  . Smoking status: Not on file  . Smokeless tobacco: Not on file  . Alcohol Use: Not on file     Colonoscopy: due  PAP: December 2012/ [name]  Bone density: Due  Lipid panel: per Dr Zachery Dauer  Allergies  Allergen Reactions  . Penicillins     "drops my heartbeat" "I just pass out"    Current Outpatient Prescriptions  Medication Sig Dispense Refill  . aspirin 81 MG tablet Take 81 mg by mouth daily.      . Calcium Citrate (CITRACAL PO) Take 4 tablets by mouth.      .  Cholecalciferol (VITAMIN D-3 PO) Take 4,000 Int'l Units by mouth.      . Estradiol (ELESTRIN) 0.52 MG/0.87 GM (0.06%) GEL Apply topically at bedtime.      . Progesterone Micronized (PROMETRIUM PO) Take 200 mg by mouth. 1st - 12th day of every other month      . traZODone (DESYREL) 50 MG tablet Take 50 mg by mouth at bedtime.      Marland Kitchen acyclovir (ZOVIRAX) 400 MG tablet Take 1 tablet (400 mg total) by mouth 2 (two) times daily.  60 tablet  12  . allopurinol (ZYLOPRIM) 300 MG tablet Take 1 tablet (300 mg total) by mouth daily.  30 tablet  4  . sulfamethoxazole-trimethoprim (BACTRIM DS,SEPTRA DS) 800-160 MG per tablet Take 1 tablet by mouth 3 (three) times a week.  30 tablet  4  . SUMAtriptan (IMITREX) 50 MG tablet Take 1 tablet (50 mg total) by mouth every 3 (three) hours as needed for migraine.  5 tablet  0    OBJECTIVE: Middle-aged white woman in no acute distress Filed Vitals:   12/10/11 0857  BP: 90/55  Pulse: 74  Temp: 98 F (36.7 C)  Resp: 20     Body mass index is 25.19 kg/(m^2).    ECOG FS: 0  Sclerae unicteric Oropharynx clear She has mild right cervical and left axillary adenopathy. Lungs no rales or rhonchi Heart regular rate and rhythm Abd benign, no organomegaly MSK  no focal spinal tenderness, no peripheral edema Neuro: nonfocal   LAB RESULTS: Lab Results  Component Value Date   WBC 123.0* 12/03/2011   NEUTROABS 6.0 12/03/2011   HGB 11.8 12/03/2011   HCT 38.8 12/03/2011   MCV 93.1 12/03/2011   PLT 176 12/03/2011      Chemistry      Component Value Date/Time   NA 139 09/09/2011 1404   K 3.7 09/09/2011 1404   CL 105 09/09/2011 1404   CO2 29 09/09/2011 1404   BUN 14 09/09/2011 1404   CREATININE 0.79 09/09/2011 1404      Component Value Date/Time   CALCIUM 9.1 09/09/2011 1404   ALKPHOS 72 09/09/2011 1404   AST 22 09/09/2011 1404   ALT 11 09/09/2011 1404   BILITOT 0.3 09/09/2011 1404       No results found for this basename: LABCA2    No components found with this  basename: LABCA125    No results found for this basename: INR:1;PROTIME:1 in the last 168 hours  Urinalysis No results found for this basename: colorurine,  appearanceur,  labspec,  phurine,  glucoseu,  hgbur,  bilirubinur,  ketonesur,  proteinur,  urobilinogen,  nitrite,  leukocytesur    STUDIES: Mammography 06/30/2011 showed some microcalcifications which are probably benign. They will be repeating a mammogram in 6 months.  ASSESSMENT: 61 y.o.  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   PLAN: We are ready to start treatment with an CD 20 antibody and she is well aware of the possible toxicities, side effects, and complications. I have written her a prescription for allopurinol to take daily, acyclovir to take twice daily, and Septra DS to take Mondays, Wednesdays, and Fridays. The plan is for 8 doses of ofatumumab. We will follow her CBC on a weekly basis. She knows to call for any problems that may develop before the next visit.  Rebekka Lobello C    12/10/2011

## 2011-12-10 NOTE — Progress Notes (Signed)
Arzerra restarted at 1750.  VS BP 97/53, HR 63, temp 98.5.  TKF  Per Dr. Darnelle Catalan, ok to stop infusion at 730 pm if no other patients in infusion at that time.  Patient agreed.    Pt discharged to home with husband.  Instructions given to patient and husband: mild reaction at home - take Benadryl according to mfg instructions.  If that does not clear the rx OR reaction is critical in nature (eg - shortness of breath) go to ED immediately.  Pt and husband verbalized understanding.

## 2011-12-10 NOTE — Telephone Encounter (Signed)
x

## 2011-12-10 NOTE — Patient Instructions (Addendum)
[facility] Discharge Instructions for Patients Receiving Chemotherapy  Today you received the following chemotherapy agents ofatumumab  To help prevent nausea and vomiting after your treatment, we encourage you to take your nausea medication if needed Begin taking it at 6pm and take it as often as prescribed for the next 48 hours if needed.   If you develop nausea and vomiting that is not controlled by your nausea medication, call the clinic. If it is after clinic hours your family physician or the after hours number for the clinic or go to the Emergency Department.   BELOW ARE SYMPTOMS THAT SHOULD BE REPORTED IMMEDIATELY:  *FEVER GREATER THAN 100.5 F  *CHILLS WITH OR WITHOUT FEVER  NAUSEA AND VOMITING THAT IS NOT CONTROLLED WITH YOUR NAUSEA MEDICATION  *UNUSUAL SHORTNESS OF BREATH  *UNUSUAL BRUISING OR BLEEDING  TENDERNESS IN MOUTH AND THROAT WITH OR WITHOUT PRESENCE OF ULCERS  *URINARY PROBLEMS  *BOWEL PROBLEMS  UNUSUAL RASH Items with * indicate a potential emergency and should be followed up as soon as possible.  One of the nurses will contact you 24 hours after your treatment. Please let the nurse know about any problems that you may have experienced. Feel free to call the clinic you have any questions or concerns. The clinic phone number is [phone].   I have been informed and understand all the instructions given to me. I know to contact the clinic, my physician, or go to the Emergency Department if any problems should occur. I do not have any questions at this time, but understand that I may call the clinic during office hours   should I have any questions or need assistance in obtaining follow up care.    __________________________________________  _____________  __________ Signature of Patient or Authorized Representative            Date                   Time    __________________________________________ Nurse's Signature

## 2011-12-10 NOTE — Progress Notes (Signed)
1350 c/o nausea, temp 99.1 stopped arezerra and spoke with amy berry. Gave ativan 0.5 sl. Restarted arzerra at 1420. 1530 c/o itching around L bra area, rash noted. Stopped arzerra and loosened bra. Rash disipated and arzerra restarted at 1545 1650 c/o itching in front of R ear, macular rash present. arzerra stopped and NS hung. Dr Darnelle Catalan informed. 25 Mg benadryl given ivp.

## 2011-12-10 NOTE — Progress Notes (Signed)
At around 17:10hr, pt. developed hives to rt. side of face and itching scalp.  Pt. denied any respiratory distress.  Azerra stopped.  Dr. Darnelle Catalan notified and okay to give another dose of Benadryl 25mg  IV and wait until symptoms improved then rechallenged.  Goal is to complete infusion today.  Per Dr. Darnelle Catalan, we may discontinue early if patient becomes the last patient left in infusion.  Will discuss scheduling with Dr. Darnelle Catalan for next tmt.

## 2011-12-11 ENCOUNTER — Telehealth: Payer: Self-pay | Admitting: *Deleted

## 2011-12-11 NOTE — Telephone Encounter (Signed)
Called patient for chemo follow up after reaction to first time Azerra. Patient states she has done fine since leaving yesterday, only awakened with a headache, did not take any meds and this has resolved. Noted patient appts, did check with Dr Darnelle Catalan, keep the rest of scheduled appts as planned, no need to see before treatment.  Informed patient we would call her back if this changed. Instructed patient to call for any problems.

## 2011-12-11 NOTE — Telephone Encounter (Signed)
Patient called and left a message to call her back regarding her appts. Per patient she neds to be in treatment room by 745. I have called her back and spoke with her husband. Patietn will have to have lab done day prior to treatment. Husband will have her call me back.  JMW

## 2011-12-12 ENCOUNTER — Telehealth: Payer: Self-pay | Admitting: *Deleted

## 2011-12-12 NOTE — Telephone Encounter (Signed)
Message left my pt stating she is concerned due to " been having problems that must be related to the chemo I had ". Marjory stated occurrences of waking up at least 5 times during the night with noted visual changes -"I was seeing like a light , and then like a circle and when I got up to go to the bathroom it was like lines that a child draws ".  This RN returned call to pt and was informed pt not at home and was presently at the gym in her yoga class. Pt will return home at 1pm.

## 2011-12-15 ENCOUNTER — Telehealth: Payer: Self-pay | Admitting: *Deleted

## 2011-12-15 ENCOUNTER — Other Ambulatory Visit: Payer: Self-pay | Admitting: Oncology

## 2011-12-15 ENCOUNTER — Other Ambulatory Visit (HOSPITAL_BASED_OUTPATIENT_CLINIC_OR_DEPARTMENT_OTHER): Admitting: Lab

## 2011-12-15 DIAGNOSIS — C9110 Chronic lymphocytic leukemia of B-cell type not having achieved remission: Secondary | ICD-10-CM

## 2011-12-15 LAB — COMPREHENSIVE METABOLIC PANEL (CC13)
ALT: 21 U/L (ref 0–55)
Alkaline Phosphatase: 104 U/L (ref 40–150)
CO2: 26 mEq/L (ref 22–29)
Creatinine: 1.1 mg/dL (ref 0.6–1.1)
Glucose: 83 mg/dl (ref 70–99)
Sodium: 138 mEq/L (ref 136–145)
Total Bilirubin: 0.3 mg/dL (ref 0.20–1.20)
Total Protein: 6.5 g/dL (ref 6.4–8.3)

## 2011-12-15 LAB — CBC WITH DIFFERENTIAL/PLATELET
BASO%: 0.6 % (ref 0.0–2.0)
HCT: 39 % (ref 34.8–46.6)
LYMPH%: 93.9 % — ABNORMAL HIGH (ref 14.0–49.7)
MCHC: 32.1 g/dL (ref 31.5–36.0)
MCV: 91.5 fL (ref 79.5–101.0)
MONO%: 0.4 % (ref 0.0–14.0)
NEUT%: 4.7 % — ABNORMAL LOW (ref 38.4–76.8)
Platelets: 184 10*3/uL (ref 145–400)
RBC: 4.26 10*6/uL (ref 3.70–5.45)

## 2011-12-15 LAB — IGG, IGA, IGM
IgA: 69 mg/dL (ref 69–380)
IgG (Immunoglobin G), Serum: 586 mg/dL — ABNORMAL LOW (ref 690–1700)

## 2011-12-15 LAB — TECHNOLOGIST REVIEW

## 2011-12-15 NOTE — Telephone Encounter (Signed)
Per patient voicemail I have moved her appt from 730 to 745 tomorrow. JMW

## 2011-12-16 ENCOUNTER — Ambulatory Visit (HOSPITAL_BASED_OUTPATIENT_CLINIC_OR_DEPARTMENT_OTHER)

## 2011-12-16 ENCOUNTER — Other Ambulatory Visit: Admitting: Lab

## 2011-12-16 ENCOUNTER — Ambulatory Visit

## 2011-12-16 VITALS — BP 88/56 | HR 61 | Temp 97.8°F | Resp 18

## 2011-12-16 DIAGNOSIS — C9110 Chronic lymphocytic leukemia of B-cell type not having achieved remission: Secondary | ICD-10-CM

## 2011-12-16 DIAGNOSIS — Z5112 Encounter for antineoplastic immunotherapy: Secondary | ICD-10-CM

## 2011-12-16 MED ORDER — DIPHENHYDRAMINE HCL 25 MG PO CAPS
25.0000 mg | ORAL_CAPSULE | Freq: Once | ORAL | Status: AC
  Administered 2011-12-16: 25 mg via ORAL

## 2011-12-16 MED ORDER — SODIUM CHLORIDE 0.45 % IV SOLN
INTRAVENOUS | Status: AC
  Administered 2011-12-16: 08:00:00 via INTRAVENOUS
  Filled 2011-12-16: qty 1000

## 2011-12-16 MED ORDER — METHYLPREDNISOLONE SODIUM SUCC 125 MG IJ SOLR
125.0000 mg | Freq: Once | INTRAMUSCULAR | Status: AC
Start: 2011-12-16 — End: 2011-12-16
  Administered 2011-12-16: 125 mg via INTRAVENOUS

## 2011-12-16 MED ORDER — ACETAMINOPHEN 500 MG PO TABS
1000.0000 mg | ORAL_TABLET | Freq: Once | ORAL | Status: AC
  Administered 2011-12-16: 1000 mg via ORAL

## 2011-12-16 MED ORDER — SODIUM CHLORIDE 0.9 % IV SOLN
2000.0000 mg | Freq: Once | INTRAVENOUS | Status: AC
  Administered 2011-12-16: 2000 mg via INTRAVENOUS
  Filled 2011-12-16: qty 100

## 2011-12-16 MED ORDER — FAMOTIDINE IN NACL 20-0.9 MG/50ML-% IV SOLN
20.0000 mg | Freq: Two times a day (BID) | INTRAVENOUS | Status: DC
  Administered 2011-12-16: 20 mg via INTRAVENOUS

## 2011-12-16 NOTE — Progress Notes (Signed)
Azerra started @ 0850 @ 12 mL/hr X 6 mL. Rate increased @ 0920 25 mL/hr X 12.5 mL.  Vital signs stable, no c/o from patient.  Son at bedside. Rate increased @ 0950: 50 mL/hr X 25 mL.  Vital signs stable, no c/o.   Rate increased @ 1020: 100 mL/hr X 50 mL.  Vital signs stable, no c/o.  Son remains at bedside.

## 2011-12-17 ENCOUNTER — Ambulatory Visit: Admitting: Oncology

## 2011-12-17 ENCOUNTER — Encounter (HOSPITAL_COMMUNITY): Payer: Self-pay

## 2011-12-17 ENCOUNTER — Ambulatory Visit

## 2011-12-17 ENCOUNTER — Other Ambulatory Visit: Payer: Self-pay

## 2011-12-17 ENCOUNTER — Emergency Department (HOSPITAL_COMMUNITY)
Admission: EM | Admit: 2011-12-17 | Discharge: 2011-12-17 | Disposition: A | Discharge status: Home / Self Care | Attending: Emergency Medicine | Admitting: Emergency Medicine

## 2011-12-17 ENCOUNTER — Telehealth: Payer: Self-pay | Admitting: *Deleted

## 2011-12-17 DIAGNOSIS — R209 Unspecified disturbances of skin sensation: Secondary | ICD-10-CM | POA: Insufficient documentation

## 2011-12-17 DIAGNOSIS — R42 Dizziness and giddiness: Secondary | ICD-10-CM | POA: Insufficient documentation

## 2011-12-17 DIAGNOSIS — C9110 Chronic lymphocytic leukemia of B-cell type not having achieved remission: Secondary | ICD-10-CM | POA: Insufficient documentation

## 2011-12-17 DIAGNOSIS — M79602 Pain in left arm: Secondary | ICD-10-CM

## 2011-12-17 DIAGNOSIS — Z79899 Other long term (current) drug therapy: Secondary | ICD-10-CM | POA: Insufficient documentation

## 2011-12-17 DIAGNOSIS — R5381 Other malaise: Secondary | ICD-10-CM | POA: Insufficient documentation

## 2011-12-17 DIAGNOSIS — Z7982 Long term (current) use of aspirin: Secondary | ICD-10-CM | POA: Insufficient documentation

## 2011-12-17 DIAGNOSIS — M79609 Pain in unspecified limb: Secondary | ICD-10-CM | POA: Insufficient documentation

## 2011-12-17 HISTORY — DX: Leukemia, unspecified not having achieved remission: C95.90

## 2011-12-17 LAB — CBC WITH DIFFERENTIAL/PLATELET
Basophils Relative: 0 % (ref 0–1)
Eosinophils Relative: 1 % (ref 0–5)
HCT: 33.9 % — ABNORMAL LOW (ref 36.0–46.0)
Hemoglobin: 10.8 g/dL — ABNORMAL LOW (ref 12.0–15.0)
Lymphs Abs: 64.7 10*3/uL — ABNORMAL HIGH (ref 0.7–4.0)
MCH: 28.6 pg (ref 26.0–34.0)
MCV: 89.9 fL (ref 78.0–100.0)
Monocytes Relative: 2 % — ABNORMAL LOW (ref 3–12)
Neutro Abs: 12.8 10*3/uL — ABNORMAL HIGH (ref 1.7–7.7)
RBC: 3.77 MIL/uL — ABNORMAL LOW (ref 3.87–5.11)
WBC: 79.9 10*3/uL (ref 4.0–10.5)

## 2011-12-17 LAB — COMPREHENSIVE METABOLIC PANEL
AST: 72 U/L — ABNORMAL HIGH (ref 0–37)
CO2: 22 mEq/L (ref 19–32)
Calcium: 8.5 mg/dL (ref 8.4–10.5)
Chloride: 106 mEq/L (ref 96–112)
Creatinine, Ser: 0.75 mg/dL (ref 0.50–1.10)
GFR calc Af Amer: >90 mL/min (ref >90)
GFR calc non Af Amer: 89 mL/min — ABNORMAL LOW (ref >90)
Glucose, Bld: 94 mg/dL (ref 70–99)
Total Bilirubin: 0.2 mg/dL — ABNORMAL LOW (ref 0.3–1.2)

## 2011-12-17 MED ORDER — SODIUM CHLORIDE 0.9 % IV BOLUS (SEPSIS): 1000.0000 mL | Freq: Once | INTRAVENOUS | Status: DC

## 2011-12-17 NOTE — ED Notes (Signed)
Pt reports chemo tx yesterday, had an IV access in her L lateral wrist.  Pt reports today her L arm began to bother her, also reports numbness and pain.  No swelling or redness noted where the IV site was.  Pt denies CP but reports having to take a deep breath at times.  Pt in NAD.

## 2011-12-17 NOTE — ED Notes (Signed)
Pt presents with no acute distress.  Pt reports rec'd chemo tx yesterday- left arm pain began yesterday evening- Pt denies chest pain and shortness of breath.  Pt c/o of fatigue, nausea and dizziness since treatment.  Pt reports she felt well after chemo treatment.

## 2011-12-17 NOTE — ED Notes (Signed)
Pt complains of nausea but no vomiting, headache, side neck pain. Pt states that her arm began to feel numb while she was driving today and the pain radiates up and down arm.

## 2011-12-17 NOTE — ED Provider Notes (Signed)
History     CSN: [id number]  Arrival date & time 12/17/11  1338   First MD Initiated Contact with Patient 12/17/11 1528      Chief Complaint  Patient presents with  . Arm Pain  . Fatigue  . Chemotherapy    yesterday at [facility]  . Dizziness    (Consider location/radiation/quality/duration/timing/severity/associated sxs/prior treatment) The history is provided by the patient.  Rachel [name] is a 62 y.o. female hx of CLL currently on chemo here with L arm paresthesias. She started chemo last week and had a treatment yesterday. She felt well after the treatment. This AM, she had L arm numbness around the chemo site but denies swelling. The numbness lasted around 20 minutes and extend from her wrist to her deltoids but do not involve the shoulder or hand. Denies SOB or CP. She also feels generalized fatigue and nausea and dizziness. Denies abdominal pain, dysuria, vomiting.    Past Medical History  Diagnosis Date  . Cancer   . Leukemia     Past Surgical History  Procedure Date  . Tonsillectomy   . Other surgical history     removal of uterine lining    No family history on file.  History  Substance Use Topics  . Smoking status: Never Smoker   . Smokeless tobacco: Not on file  . Alcohol Use: Yes    OB History    Grav Para Term Preterm Abortions TAB SAB Ect Mult Living                  Review of Systems  Gastrointestinal: Positive for nausea.  Neurological: Positive for weakness and numbness.  All other systems reviewed and are negative.    Allergies  Penicillins  Home Medications   Current Outpatient Rx  Name Route Sig Dispense Refill  . ACETAMINOPHEN 500 MG PO TABS Oral Take 1,000 mg by mouth every 6 (six) hours as needed. Headache    . ACYCLOVIR 400 MG PO TABS Oral Take 400 mg by mouth 2 (two) times daily.    . ALLOPURINOL 300 MG PO TABS Oral Take 1 tablet (300 mg total) by mouth daily. 30 tablet 4  . ASPIRIN 81 MG PO TABS Oral Take 81 mg by  mouth daily.    Marland Kitchen CITRACAL PO Oral Take 4 tablets by mouth daily.     Marland Kitchen ESTRADIOL 0.52 MG/0.87 GM (0.06%) TD GEL Topical Apply 1 application topically at bedtime.     Marland Kitchen PROMETRIUM PO Oral Take 200 mg by mouth. 1st - 12th day of every other month    . SULFAMETHOXAZOLE-TRIMETHOPRIM 800-160 MG PO TABS Oral Take 1 tablet by mouth 3 (three) times a week. Take Monday, Wednesday and Friday    . TRAZODONE HCL 50 MG PO TABS Oral Take 50 mg by mouth at bedtime.    Marland Kitchen VITAMIN D-3 PO Oral Take 4,000 Int'l Units by mouth.      BP 103/63  Pulse 52  Temp 97.5 F (36.4 C) (Oral)  Resp 16  SpO2 98%  Physical Exam  Nursing note and vitals reviewed. Constitutional: She is oriented to person, place, and time. She appears well-developed and well-nourished.       Tired, slightly anxious   HENT:  Head: Normocephalic.  Mouth/Throat: Oropharynx is clear and moist.  Eyes: Conjunctivae normal and EOM are normal. Pupils are equal, round, and reactive to light.  Neck: Normal range of motion. Neck supple.  Cardiovascular: Normal rate, regular rhythm and normal  heart sounds.   Pulmonary/Chest: Effort normal and breath sounds normal. No respiratory distress. She has no wheezes. She has no rales.  Abdominal: Soft. Bowel sounds are normal. She exhibits no distension. There is no tenderness. There is no rebound.  Musculoskeletal: Normal range of motion.       The catheter site around L wrist is not swollen and no evidence of cellulitis.   Neurological: She is alert and oriented to person, place, and time.       Sensation intact in L arm, nl motor. 2+ pulses. No swollen lymph nodes.   Skin: Skin is warm and dry.  Psychiatric: She has a normal mood and affect. Her behavior is normal. Judgment and thought content normal.    ED Course  Procedures (including critical care time)  Labs Reviewed  CBC WITH DIFFERENTIAL - Abnormal; Notable for the following:    WBC 79.9 (*)     RBC 3.77 (*)     Hemoglobin 10.8 (*)      HCT 33.9 (*)     Neutrophils Relative 16 (*)     Lymphocytes Relative 81 (*)     Monocytes Relative 2 (*)     Neutro Abs 12.8 (*)     Lymphs Abs 64.7 (*)     Monocytes Absolute 1.6 (*)     Eosinophils Absolute 0.8 (*)     All other components within normal limits  COMPREHENSIVE METABOLIC PANEL - Abnormal; Notable for the following:    Total Protein 5.9 (*)     AST 72 (*)     ALT 86 (*)     Total Bilirubin 0.2 (*)     GFR calc non Af Amer 89 (*)     All other components within normal limits  PATHOLOGIST SMEAR REVIEW   No results found.   No diagnosis found.   Date: 12/17/2011  Rate: 54  Rhythm: normal sinus rhythm  QRS Axis: normal  Intervals: normal  ST/T Wave abnormalities: normal  Conduction Disutrbances:none  Narrative Interpretation:   Old EKG Reviewed: none available    MDM  Rachel [name] is a 61 y.o. female hx CLL s/p chemo yesterday here with L arm paresthesias and weakness. Will get repeat CBC, CMP to look for anemia or electrolyte imbalance. Will give IVF. Unlikely to be acs given atypical symptoms and no cardiac risk factors and nl EKG.   5:45 PM WBC decreased to 80 from 100 several days ago. I discussed with Dr. Willadean Carol, who is covering her oncologist. She said that this is to be expected after chemotherapy. No concern for tumor lysis syndrome. Paresthesias resolved. Return precautions given.          Richardean Canal, MD 12/17/11 [phone]

## 2011-12-17 NOTE — Telephone Encounter (Signed)
Patient reports she had some numbness in her left arm yesterday that resolved. Today the numbness is extending up her elbow into her shoulder. Also tells nurse she is nauseated, feels a little sweaty. Tried to work out at gym today, but was not able to. Asking if this is a side effect of the chemo. Told her this is not an expected side effect and she needs to go to closest emergency room to have a cardiac event ruled out. Her husband will take her now. Told her to call us after all this is over and let us know what they determined was wrong.

## 2011-12-18 ENCOUNTER — Telehealth: Payer: Self-pay | Admitting: *Deleted

## 2011-12-18 LAB — PATHOLOGIST SMEAR REVIEW

## 2011-12-18 NOTE — Telephone Encounter (Signed)
Checking with desk RN regarding appts. JMW

## 2011-12-18 NOTE — Telephone Encounter (Signed)
Per patient phone call I have given her the appts for next week.  Plus moved all chemo from 730 to 745.  JMW

## 2011-12-22 ENCOUNTER — Other Ambulatory Visit (HOSPITAL_BASED_OUTPATIENT_CLINIC_OR_DEPARTMENT_OTHER): Admitting: Lab

## 2011-12-22 DIAGNOSIS — C9110 Chronic lymphocytic leukemia of B-cell type not having achieved remission: Secondary | ICD-10-CM

## 2011-12-22 LAB — MANUAL DIFFERENTIAL
ALC: 74.6 10*3/uL — ABNORMAL HIGH (ref 0.9–3.3)
ANC (CHCC manual diff): 6.6 10*3/uL — ABNORMAL HIGH (ref 1.5–6.5)
Band Neutrophils: 0 % (ref 0–10)
Basophil: 0 % (ref 0–2)
Blasts: 0 % (ref 0–0)
PLT EST: ADEQUATE
PROMYELO: 0 % (ref 0–0)
SEG: 8 % — ABNORMAL LOW (ref 38–77)
Variant Lymph: 0 % (ref 0–0)
nRBC: 0 % (ref 0–0)

## 2011-12-22 LAB — CBC WITH DIFFERENTIAL/PLATELET
HGB: 12.2 g/dL (ref 11.6–15.9)
MCH: 28.8 pg (ref 25.1–34.0)
RDW: 13.7 % (ref 11.2–14.5)
WBC: 82.9 10*3/uL (ref 3.9–10.3)

## 2011-12-23 ENCOUNTER — Other Ambulatory Visit: Admitting: Lab

## 2011-12-23 ENCOUNTER — Ambulatory Visit (HOSPITAL_BASED_OUTPATIENT_CLINIC_OR_DEPARTMENT_OTHER)

## 2011-12-23 VITALS — BP 101/64 | HR 70 | Temp 97.9°F | Resp 20

## 2011-12-23 DIAGNOSIS — Z5112 Encounter for antineoplastic immunotherapy: Secondary | ICD-10-CM

## 2011-12-23 DIAGNOSIS — C9110 Chronic lymphocytic leukemia of B-cell type not having achieved remission: Secondary | ICD-10-CM

## 2011-12-23 MED ORDER — SODIUM CHLORIDE 0.45 % IV SOLN
INTRAVENOUS | Status: AC
  Administered 2011-12-23: 11:00:00 via INTRAVENOUS
  Filled 2011-12-23: qty 1000

## 2011-12-23 MED ORDER — METHYLPREDNISOLONE SODIUM SUCC 125 MG IJ SOLR
125.0000 mg | Freq: Once | INTRAMUSCULAR | Status: AC
  Administered 2011-12-23: 125 mg via INTRAVENOUS

## 2011-12-23 MED ORDER — OFATUMUMAB CHEMO INJECTION 100 MG/5ML
2000.0000 mg | Freq: Once | INTRAVENOUS | Status: AC
  Administered 2011-12-23: 2000 mg via INTRAVENOUS
  Filled 2011-12-23: qty 100

## 2011-12-23 MED ORDER — ACETAMINOPHEN 500 MG PO TABS
1000.0000 mg | ORAL_TABLET | Freq: Once | ORAL | Status: AC
  Administered 2011-12-23: 1000 mg via ORAL

## 2011-12-23 MED ORDER — DIPHENHYDRAMINE HCL 25 MG PO CAPS
25.0000 mg | ORAL_CAPSULE | Freq: Once | ORAL | Status: AC
  Administered 2011-12-23: 25 mg via ORAL

## 2011-12-23 MED ORDER — FAMOTIDINE IN NACL 20-0.9 MG/50ML-% IV SOLN
20.0000 mg | Freq: Once | INTRAVENOUS | Status: AC
  Administered 2011-12-23: 20 mg via INTRAVENOUS

## 2011-12-23 NOTE — Patient Instructions (Addendum)
[facility] Discharge Instructions for Patients Receiving Chemotherapy  Today you received the following chemotherapy agents Azerra.  To help prevent any fever or side effects, start taking Tylenol 650mg  and Bendaryl 25mg  every 6 hours x 24 hours. We will call and check your status tomorrow.   If you develop nausea and vomiting that is not controlled by your nausea medication, call the clinic. If it is after clinic hours your family physician or the after hours number for the clinic or go to the Emergency Department.   BELOW ARE SYMPTOMS THAT SHOULD BE REPORTED IMMEDIATELY:  *FEVER GREATER THAN 100.5 F  *CHILLS WITH OR WITHOUT FEVER  NAUSEA AND VOMITING THAT IS NOT CONTROLLED WITH YOUR NAUSEA MEDICATION  *UNUSUAL SHORTNESS OF BREATH  *UNUSUAL BRUISING OR BLEEDING  TENDERNESS IN MOUTH AND THROAT WITH OR WITHOUT PRESENCE OF ULCERS  *URINARY PROBLEMS  *BOWEL PROBLEMS  UNUSUAL RASH Items with * indicate a potential emergency and should be followed up as soon as possible.  One of the nurses will contact you 24 hours after your treatment. Please let the nurse know about any problems that you may have experienced. Feel free to call the clinic you have any questions or concerns. The clinic phone number is [phone].   I have been informed and understand all the instructions given to me. I know to contact the clinic, my physician, or go to the Emergency Department if any problems should occur. I do not have any questions at this time, but understand that I may call the clinic during office hours   should I have any questions or need assistance in obtaining follow up care.    __________________________________________  _____________  __________ Signature of Patient or Authorized Representative            Date                   Time    __________________________________________ Nurse's Signature

## 2011-12-24 ENCOUNTER — Ambulatory Visit

## 2011-12-24 ENCOUNTER — Telehealth: Payer: Self-pay | Admitting: *Deleted

## 2011-12-24 NOTE — Telephone Encounter (Signed)
Called patient at home after 3rd dose of Azerra yesterday. Patient has had side effects from first 2 cycles. Yesterday instructions were to continue Tylenol/Benadryl x 24hours after treatment. She states she has done this, she takes a nap afterwards and wakes up feeling fine and fully functional. Encouraged patient to continue through dose at 1800 and then monitor. Continue to eat and hydrate well and call us if any problems. Patient verbalized understanding.

## 2011-12-29 ENCOUNTER — Other Ambulatory Visit (HOSPITAL_BASED_OUTPATIENT_CLINIC_OR_DEPARTMENT_OTHER): Admitting: Lab

## 2011-12-29 DIAGNOSIS — C9110 Chronic lymphocytic leukemia of B-cell type not having achieved remission: Secondary | ICD-10-CM

## 2011-12-29 LAB — MANUAL DIFFERENTIAL
ALC: 61.5 10*3/uL — ABNORMAL HIGH (ref 0.9–3.3)
LYMPH: 92 % — ABNORMAL HIGH (ref 14–49)
MONO: 0 % (ref 0–14)
Other Cell: 0 % (ref 0–0)
SEG: 8 % — ABNORMAL LOW (ref 38–77)
Variant Lymph: 0 % (ref 0–0)

## 2011-12-29 LAB — CBC WITH DIFFERENTIAL/PLATELET
HCT: 39.4 % (ref 34.8–46.6)
MCHC: 32 g/dL (ref 31.5–36.0)
MCV: 90.6 fL (ref 79.5–101.0)
Platelets: 176 10*3/uL (ref 145–400)

## 2011-12-30 ENCOUNTER — Ambulatory Visit (HOSPITAL_BASED_OUTPATIENT_CLINIC_OR_DEPARTMENT_OTHER)

## 2011-12-30 ENCOUNTER — Other Ambulatory Visit: Admitting: Lab

## 2011-12-30 VITALS — BP 96/60 | HR 77 | Temp 98.6°F | Resp 18

## 2011-12-30 DIAGNOSIS — C9110 Chronic lymphocytic leukemia of B-cell type not having achieved remission: Secondary | ICD-10-CM

## 2011-12-30 DIAGNOSIS — Z5112 Encounter for antineoplastic immunotherapy: Secondary | ICD-10-CM

## 2011-12-30 MED ORDER — METHYLPREDNISOLONE SODIUM SUCC 125 MG IJ SOLR
125.0000 mg | Freq: Once | INTRAMUSCULAR | Status: AC
  Administered 2011-12-30: 125 mg via INTRAVENOUS

## 2011-12-30 MED ORDER — ACETAMINOPHEN 500 MG PO TABS
1000.0000 mg | ORAL_TABLET | Freq: Once | ORAL | Status: AC
  Administered 2011-12-30: 1000 mg via ORAL

## 2011-12-30 MED ORDER — SODIUM CHLORIDE 0.9 % IV SOLN
2000.0000 mg | Freq: Once | INTRAVENOUS | Status: AC
  Administered 2011-12-30: 2000 mg via INTRAVENOUS
  Filled 2011-12-30: qty 100

## 2011-12-30 MED ORDER — SODIUM CHLORIDE 0.45 % IV SOLN
INTRAVENOUS | Status: AC
  Administered 2011-12-30: 250 mL via INTRAVENOUS
  Filled 2011-12-30: qty 1000

## 2011-12-30 MED ORDER — FAMOTIDINE IN NACL 20-0.9 MG/50ML-% IV SOLN: 20.0000 mg | Freq: Two times a day (BID) | INTRAVENOUS | Status: DC

## 2011-12-30 MED ORDER — DIPHENHYDRAMINE HCL 25 MG PO CAPS
25.0000 mg | ORAL_CAPSULE | Freq: Once | ORAL | Status: AC
  Administered 2011-12-30: 25 mg via ORAL

## 2011-12-30 MED ORDER — SODIUM CHLORIDE 0.9 % IV SOLN
Freq: Once | INTRAVENOUS | Status: AC
  Administered 2011-12-30: 10:00:00 via INTRAVENOUS

## 2011-12-30 NOTE — Patient Instructions (Addendum)
[facility] Discharge Instructions for Patients Receiving Chemotherapy  Today you received the following chemotherapy agents Azerra  To help prevent nausea and vomiting after your treatment, we encourage you to take your nausea medication as prescribed.   If you develop nausea and vomiting that is not controlled by your nausea medication, call the clinic. If it is after clinic hours your family physician or the after hours number for the clinic or go to the Emergency Department.   BELOW ARE SYMPTOMS THAT SHOULD BE REPORTED IMMEDIATELY:  *FEVER GREATER THAN 100.5 F  *CHILLS WITH OR WITHOUT FEVER  NAUSEA AND VOMITING THAT IS NOT CONTROLLED WITH YOUR NAUSEA MEDICATION  *UNUSUAL SHORTNESS OF BREATH  *UNUSUAL BRUISING OR BLEEDING  TENDERNESS IN MOUTH AND THROAT WITH OR WITHOUT PRESENCE OF ULCERS  *URINARY PROBLEMS  *BOWEL PROBLEMS  UNUSUAL RASH Items with * indicate a potential emergency and should be followed up as soon as possible.   Feel free to call the clinic you have any questions or concerns. The clinic phone number is [phone].   I have been informed and understand all the instructions given to me. I know to contact the clinic, my physician, or go to the Emergency Department if any problems should occur. I do not have any questions at this time, but understand that I may call the clinic during office hours   should I have any questions or need assistance in obtaining follow up care.

## 2011-12-30 NOTE — Progress Notes (Signed)
Rate increased to 400 ml's/hr for 200 ml's. Pt stable

## 2011-12-30 NOTE — Progress Notes (Signed)
Rate increased to 100 ml's/hr for 50 ml's. Pt stable

## 2011-12-30 NOTE — Progress Notes (Signed)
Arzerra started at a rate of 25 ml/hr for 12.5 mls.

## 2011-12-31 ENCOUNTER — Ambulatory Visit

## 2012-01-05 ENCOUNTER — Other Ambulatory Visit (HOSPITAL_BASED_OUTPATIENT_CLINIC_OR_DEPARTMENT_OTHER): Admitting: Lab

## 2012-01-05 DIAGNOSIS — C9110 Chronic lymphocytic leukemia of B-cell type not having achieved remission: Secondary | ICD-10-CM

## 2012-01-05 LAB — MANUAL DIFFERENTIAL
ANC (CHCC manual diff): 5.9 10*3/uL (ref 1.5–6.5)
Basophil: 0 % (ref 0–2)
Blasts: 0 % (ref 0–0)
Metamyelocytes: 0 % (ref 0–0)
PLT EST: ADEQUATE
PROMYELO: 0 % (ref 0–0)
RBC Comments: NORMAL

## 2012-01-05 LAB — CBC WITH DIFFERENTIAL/PLATELET
MCH: 29.4 pg (ref 25.1–34.0)
MCHC: 32.9 g/dL (ref 31.5–36.0)
RBC: 4.42 10*6/uL (ref 3.70–5.45)
RDW: 14 % (ref 11.2–14.5)

## 2012-01-06 ENCOUNTER — Other Ambulatory Visit: Admitting: Lab

## 2012-01-06 ENCOUNTER — Ambulatory Visit (HOSPITAL_BASED_OUTPATIENT_CLINIC_OR_DEPARTMENT_OTHER)

## 2012-01-06 VITALS — BP 105/60 | HR 90 | Temp 98.6°F | Resp 20

## 2012-01-06 DIAGNOSIS — Z5112 Encounter for antineoplastic immunotherapy: Secondary | ICD-10-CM

## 2012-01-06 DIAGNOSIS — C9110 Chronic lymphocytic leukemia of B-cell type not having achieved remission: Secondary | ICD-10-CM

## 2012-01-06 MED ORDER — SODIUM CHLORIDE 0.45 % IV SOLN
INTRAVENOUS | Status: AC
  Administered 2012-01-06: 08:00:00 via INTRAVENOUS
  Filled 2012-01-06: qty 1000

## 2012-01-06 MED ORDER — METHYLPREDNISOLONE SODIUM SUCC 125 MG IJ SOLR
125.0000 mg | Freq: Once | INTRAMUSCULAR | Status: AC
Start: 2012-01-06 — End: 2012-01-06
  Administered 2012-01-06: 125 mg via INTRAVENOUS

## 2012-01-06 MED ORDER — SODIUM CHLORIDE 0.9 % IV SOLN
Freq: Once | INTRAVENOUS | Status: AC
  Administered 2012-01-06: 08:00:00 via INTRAVENOUS

## 2012-01-06 MED ORDER — ACETAMINOPHEN 500 MG PO TABS
1000.0000 mg | ORAL_TABLET | Freq: Once | ORAL | Status: AC
  Administered 2012-01-06: 1000 mg via ORAL

## 2012-01-06 MED ORDER — SODIUM CHLORIDE 0.9 % IV SOLN
2000.0000 mg | Freq: Once | INTRAVENOUS | Status: AC
  Administered 2012-01-06: 2000 mg via INTRAVENOUS
  Filled 2012-01-06: qty 100

## 2012-01-06 MED ORDER — DIPHENHYDRAMINE HCL 25 MG PO CAPS
25.0000 mg | ORAL_CAPSULE | Freq: Once | ORAL | Status: AC
  Administered 2012-01-06: 25 mg via ORAL

## 2012-01-06 NOTE — Progress Notes (Signed)
Rate increased to 100 ml's/hr for 50 ml's. Pt stable 

## 2012-01-06 NOTE — Progress Notes (Signed)
arzerra rate increased to 50 ml's/hr for 25 ml's. Pt stable.

## 2012-01-06 NOTE — Progress Notes (Signed)
Rate increased to 400 ml's/hr for 200 ml's. Pt stable 

## 2012-01-06 NOTE — Patient Instructions (Addendum)
[facility] Discharge Instructions for Patients Receiving Chemotherapy  Today you received the following chemotherapy agents Azerra  To help prevent nausea and vomiting after your treatment, we encourage you to take your nausea medication as prescribed.   If you develop nausea and vomiting that is not controlled by your nausea medication, call the clinic. If it is after clinic hours your family physician or the after hours number for the clinic or go to the Emergency Department.   BELOW ARE SYMPTOMS THAT SHOULD BE REPORTED IMMEDIATELY:  *FEVER GREATER THAN 100.5 F  *CHILLS WITH OR WITHOUT FEVER  NAUSEA AND VOMITING THAT IS NOT CONTROLLED WITH YOUR NAUSEA MEDICATION  *UNUSUAL SHORTNESS OF BREATH  *UNUSUAL BRUISING OR BLEEDING  TENDERNESS IN MOUTH AND THROAT WITH OR WITHOUT PRESENCE OF ULCERS  *URINARY PROBLEMS  *BOWEL PROBLEMS  UNUSUAL RASH Items with * indicate a potential emergency and should be followed up as soon as possible.   Feel free to call the clinic you have any questions or concerns. The clinic phone number is [phone].   I have been informed and understand all the instructions given to me. I know to contact the clinic, my physician, or go to the Emergency Department if any problems should occur. I do not have any questions at this time, but understand that I may call the clinic during office hours   should I have any questions or need assistance in obtaining follow up care.    

## 2012-01-07 ENCOUNTER — Ambulatory Visit

## 2012-01-09 ENCOUNTER — Telehealth: Payer: Self-pay | Admitting: *Deleted

## 2012-01-13 ENCOUNTER — Other Ambulatory Visit: Admitting: Lab

## 2012-01-14 ENCOUNTER — Telehealth: Payer: Self-pay | Admitting: Oncology

## 2012-01-14 ENCOUNTER — Encounter (HOSPITAL_COMMUNITY): Payer: Self-pay | Admitting: *Deleted

## 2012-01-14 ENCOUNTER — Emergency Department (HOSPITAL_COMMUNITY)

## 2012-01-14 ENCOUNTER — Ambulatory Visit (HOSPITAL_BASED_OUTPATIENT_CLINIC_OR_DEPARTMENT_OTHER)

## 2012-01-14 ENCOUNTER — Other Ambulatory Visit: Payer: Self-pay

## 2012-01-14 ENCOUNTER — Telehealth: Payer: Self-pay | Admitting: *Deleted

## 2012-01-14 ENCOUNTER — Emergency Department (HOSPITAL_COMMUNITY)
Admission: EM | Admit: 2012-01-14 | Discharge: 2012-01-15 | Disposition: A | Discharge status: Home / Self Care | Attending: Emergency Medicine | Admitting: Emergency Medicine

## 2012-01-14 ENCOUNTER — Other Ambulatory Visit (HOSPITAL_BASED_OUTPATIENT_CLINIC_OR_DEPARTMENT_OTHER): Admitting: Lab

## 2012-01-14 ENCOUNTER — Other Ambulatory Visit: Payer: Self-pay | Admitting: Emergency Medicine

## 2012-01-14 ENCOUNTER — Ambulatory Visit (HOSPITAL_BASED_OUTPATIENT_CLINIC_OR_DEPARTMENT_OTHER): Admitting: Oncology

## 2012-01-14 VITALS — BP 99/64 | HR 65 | Temp 97.5°F | Resp 20 | Ht 58.5 in | Wt 123.5 lb

## 2012-01-14 VITALS — BP 89/54 | HR 67 | Temp 97.3°F | Resp 18

## 2012-01-14 DIAGNOSIS — Z5112 Encounter for antineoplastic immunotherapy: Secondary | ICD-10-CM

## 2012-01-14 DIAGNOSIS — R079 Chest pain, unspecified: Secondary | ICD-10-CM | POA: Insufficient documentation

## 2012-01-14 DIAGNOSIS — C9110 Chronic lymphocytic leukemia of B-cell type not having achieved remission: Secondary | ICD-10-CM | POA: Insufficient documentation

## 2012-01-14 DIAGNOSIS — F411 Generalized anxiety disorder: Secondary | ICD-10-CM

## 2012-01-14 DIAGNOSIS — Z859 Personal history of malignant neoplasm, unspecified: Secondary | ICD-10-CM | POA: Insufficient documentation

## 2012-01-14 DIAGNOSIS — Z79899 Other long term (current) drug therapy: Secondary | ICD-10-CM | POA: Insufficient documentation

## 2012-01-14 HISTORY — DX: Low back pain, unspecified: M54.50

## 2012-01-14 HISTORY — DX: Low back pain: M54.5

## 2012-01-14 LAB — MANUAL DIFFERENTIAL
ALC: 19.3 10*3/uL — ABNORMAL HIGH (ref 0.9–3.3)
Blasts: 0 % (ref 0–0)
MONO: 3 % (ref 0–14)
Myelocytes: 0 % (ref 0–0)
Other Cell: 0 % (ref 0–0)
SEG: 18 % — ABNORMAL LOW (ref 38–77)
Variant Lymph: 0 % (ref 0–0)

## 2012-01-14 LAB — CBC
MCHC: 32.4 g/dL (ref 30.0–36.0)
Platelets: 197 10*3/uL (ref 150–400)
RDW: 14.1 % (ref 11.5–15.5)
WBC: 28 10*3/uL — ABNORMAL HIGH (ref 4.0–10.5)

## 2012-01-14 LAB — CBC WITH DIFFERENTIAL/PLATELET
HCT: 37.9 % (ref 34.8–46.6)
Platelets: 160 10*3/uL (ref 145–400)
RBC: 4.21 10*6/uL (ref 3.70–5.45)
WBC: 24.8 10*3/uL — ABNORMAL HIGH (ref 3.9–10.3)

## 2012-01-14 LAB — COMPREHENSIVE METABOLIC PANEL
AST: 54 U/L — ABNORMAL HIGH (ref 0–37)
Albumin: 3.6 g/dL (ref 3.5–5.2)
Alkaline Phosphatase: 78 U/L (ref 39–117)
BUN: 14 mg/dL (ref 6–23)
Chloride: 104 mEq/L (ref 96–112)
Creatinine, Ser: 0.8 mg/dL (ref 0.50–1.10)
Potassium: 4 mEq/L (ref 3.5–5.1)
Total Bilirubin: 0.2 mg/dL — ABNORMAL LOW (ref 0.3–1.2)
Total Protein: 6.3 g/dL (ref 6.0–8.3)

## 2012-01-14 LAB — LIPASE, BLOOD: Lipase: 47 U/L (ref 11–59)

## 2012-01-14 MED ORDER — METHYLPREDNISOLONE SODIUM SUCC 125 MG IJ SOLR
125.0000 mg | Freq: Once | INTRAMUSCULAR | Status: AC
  Administered 2012-01-14: 125 mg via INTRAVENOUS

## 2012-01-14 MED ORDER — SODIUM CHLORIDE 0.9 % IV SOLN
Freq: Once | INTRAVENOUS | Status: AC
  Administered 2012-01-14: 10:00:00 via INTRAVENOUS

## 2012-01-14 MED ORDER — IOHEXOL 350 MG/ML SOLN
100.0000 mL | Freq: Once | INTRAVENOUS | Status: AC | PRN
  Administered 2012-01-14: 100 mL via INTRAVENOUS

## 2012-01-14 MED ORDER — ACETAMINOPHEN 500 MG PO TABS
1000.0000 mg | ORAL_TABLET | Freq: Once | ORAL | Status: AC
  Administered 2012-01-14: 1000 mg via ORAL

## 2012-01-14 MED ORDER — SODIUM CHLORIDE 0.45 % IV SOLN
INTRAVENOUS | Status: AC
  Administered 2012-01-14: 250 mL via INTRAVENOUS
  Filled 2012-01-14: qty 1000

## 2012-01-14 MED ORDER — ACYCLOVIR 400 MG PO TABS: 400.0000 mg | ORAL_TABLET | Freq: Two times a day (BID) | ORAL | Status: DC

## 2012-01-14 MED ORDER — ALLOPURINOL 300 MG PO TABS: 300.0000 mg | ORAL_TABLET | Freq: Every day | ORAL | Status: DC

## 2012-01-14 MED ORDER — DIPHENHYDRAMINE HCL 25 MG PO CAPS
25.0000 mg | ORAL_CAPSULE | Freq: Once | ORAL | Status: AC
  Administered 2012-01-14: 25 mg via ORAL

## 2012-01-14 MED ORDER — SODIUM CHLORIDE 0.9 % IV SOLN
2000.0000 mg | Freq: Once | INTRAVENOUS | Status: AC
  Administered 2012-01-14: 2000 mg via INTRAVENOUS
  Filled 2012-01-14: qty 100

## 2012-01-14 NOTE — Telephone Encounter (Signed)
azterra 11/26, 12/2 and 12/9; lab the day BEFORE each treatment; see me mid Feb, PM appt, labs day before   [facility] email to set up patient appointment

## 2012-01-14 NOTE — Progress Notes (Signed)
arzerra increased to 50 ml's/hr for 25 ml's.  Pt stable.

## 2012-01-14 NOTE — ED Notes (Signed)
Pt reports having a stiff neck.  Pt reports not feeling "normal" today.  Pt describes tightness all over her body but especially over her chest.

## 2012-01-14 NOTE — Progress Notes (Signed)
Arzerra rate increased to 100 ml's/hr for 50 ml's.  Pt stable.

## 2012-01-14 NOTE — Telephone Encounter (Signed)
On call: patient called now due to sticking, sharp pain beneath right breast going to shoulder, continuous since last pm and more uncomfortable now. 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 Recommended she go to ED for evaluation. Ila Mcgill, MD

## 2012-01-14 NOTE — Telephone Encounter (Signed)
On call:  Patient called due to sharp, stabbing pain beneath right breast continuous since last pm and increasing. She did not mention this when she was at [facility] for ofatumumab treatment for CLL today. I recommended that she go to ED for evaluation. Ila Mcgill, MD

## 2012-01-14 NOTE — Patient Instructions (Addendum)
[facility] Discharge Instructions for Patients Receiving Chemotherapy  Today you received the following chemotherapy agents Azerra  To help prevent nausea and vomiting after your treatment, we encourage you to take your nausea medication as perscribed Begin taking it as directed and take it as often as prescribed for the next 72 hours.   If you develop nausea and vomiting that is not controlled by your nausea medication, call the clinic. If it is after clinic hours your family physician or the after hours number for the clinic or go to the Emergency Department.   BELOW ARE SYMPTOMS THAT SHOULD BE REPORTED IMMEDIATELY:  *FEVER GREATER THAN 100.5 F  *CHILLS WITH OR WITHOUT FEVER  NAUSEA AND VOMITING THAT IS NOT CONTROLLED WITH YOUR NAUSEA MEDICATION  *UNUSUAL SHORTNESS OF BREATH  *UNUSUAL BRUISING OR BLEEDING  TENDERNESS IN MOUTH AND THROAT WITH OR WITHOUT PRESENCE OF ULCERS  *URINARY PROBLEMS  *BOWEL PROBLEMS  UNUSUAL RASH Items with * indicate a potential emergency and should be followed up as soon as possible.  One of the nurses will contact you 24 hours after your treatment. Please let the nurse know about any problems that you may have experienced. Feel free to call the clinic you have any questions or concerns. The clinic phone number is [phone].   I have been informed and understand all the instructions given to me. I know to contact the clinic, my physician, or go to the Emergency Department if any problems should occur. I do not have any questions at this time, but understand that I may call the clinic during office hours   should I have any questions or need assistance in obtaining follow up care.    __________________________________________  _____________  __________ Signature of Patient or Authorized Representative            Date                   Time    __________________________________________ Nurse's Signature

## 2012-01-14 NOTE — Progress Notes (Signed)
Pre arzerra.  Rate started at 18ml's/hr for 12.5 ml's.

## 2012-01-14 NOTE — Progress Notes (Signed)
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 She has had 4 full dose cycles of ofatumumab and has tolerated the last 2 cycles without difficulty. She has gone back to the gym, and is doing resumed or water aerobics about 4 times per week.  REVIEW OF SYSTEMS: She continues to be anxious and certainly there are many family stresses as previously discussed. She sleeps poorly. She is mildly fatigued as a result. She has some floaters in her left thigh which bother her. Occasionally she has loose bowel movements which she associates with food intolerances. She has chronic mild urinary leakage symptoms. She bruises easily. She has mild to moderate chronic back pain which is not disabling. She is having some hot flashes but more chills and hot flashes. Otherwise a detailed review of systems today was noncontributory.  PAST MEDICAL HISTORY: Past Medical History  Diagnosis Date  . Cancer   . Leukemia     PAST SURGICAL HISTORY: Past Surgical History  Procedure Date  . Tonsillectomy   . Other surgical history     removal of uterine lining    FAMILY HISTORY No family history on file.  GYNECOLOGIC HISTORY:  SOCIAL HISTORY: Her husband [name] is retired from [employer]. Her son [name] lives in [place] and has 3 sons; he works for a Education officer, environmental. A second son lives in [place] has two children. Son [name] lives in [place] and has a daughter, as well as 2 sons by marriage.   ADVANCED DIRECTIVES:  HEALTH MAINTENANCE: History  Substance Use Topics  . Smoking status: Never Smoker   . Smokeless tobacco: Not on file  . Alcohol Use: Yes     Colonoscopy: due  PAP: December 2012/ [name]  Bone density: Due  Lipid panel: per Dr Zachery Dauer  Allergies  Allergen Reactions  . Penicillins     "drops my heartbeat" "I just pass  out"    Current Outpatient Prescriptions  Medication Sig Dispense Refill  . acetaminophen (TYLENOL) 500 MG tablet Take 1,000 mg by mouth every 6 (six) hours as needed. Headache      . aspirin 81 MG tablet Take 81 mg by mouth daily.      . Calcium Citrate (CITRACAL PO) Take 4 tablets by mouth daily.       . Cholecalciferol (VITAMIN D-3 PO) Take 4,000 Int'l Units by mouth.      . Estradiol (ELESTRIN) 0.52 MG/0.87 GM (0.06%) GEL Apply 1 application topically at bedtime.       . Progesterone Micronized (PROMETRIUM PO) Take 200 mg by mouth. 1st - 12th day of every other month      . sulfamethoxazole-trimethoprim (BACTRIM DS,SEPTRA DS) 800-160 MG per tablet Take 1 tablet by mouth 3 (three) times a week. Take Monday, Wednesday and Friday      . traZODone (DESYREL) 50 MG tablet Take 50 mg by mouth at bedtime.      Marland Kitchen acyclovir (ZOVIRAX) 400 MG tablet Take 400 mg by mouth 2 (two) times daily.      Marland Kitchen allopurinol (ZYLOPRIM) 300 MG tablet Take 1 tablet (300 mg total) by mouth daily.  30 tablet  4    OBJECTIVE: Middle-aged white woman in no acute distress Filed Vitals:   01/14/12 0841  BP: 99/64  Pulse: 65  Temp: 97.5 F (36.4 C)  Resp: 20  Body mass index is 25.37 kg/(m^2).    ECOG FS: 0  Sclerae unicteric Oropharynx clear No cervical or axillary adenopathy palpated Lungs no rales or rhonchi Heart regular rate and rhythm Abd benign, no organomegaly MSK no focal spinal tenderness, no peripheral edema Neuro: nonfocal   LAB RESULTS: Lab Results  Component Value Date   WBC 24.8* 01/14/2012   NEUTROABS 12.8* 12/17/2011   HGB 12.7 01/14/2012   HCT 37.9 01/14/2012   MCV 90.0 01/14/2012   PLT 160 01/14/2012      Chemistry      Component Value Date/Time   NA 136 12/17/2011 1605   NA 138 12/15/2011 1322   K 5.0 12/17/2011 1605   K 3.7 12/15/2011 1322   CL 106 12/17/2011 1605   CL 101 12/15/2011 1322   CO2 22 12/17/2011 1605   CO2 26 12/15/2011 1322   BUN 9 12/17/2011 1605    BUN 14.0 12/15/2011 1322   CREATININE 0.75 12/17/2011 1605   CREATININE 1.1 12/15/2011 1322      Component Value Date/Time   CALCIUM 8.5 12/17/2011 1605   CALCIUM 9.9 12/15/2011 1322   ALKPHOS 89 12/17/2011 1605   ALKPHOS 104 12/15/2011 1322   AST 72* 12/17/2011 1605   AST 22 12/15/2011 1322   ALT 86* 12/17/2011 1605   ALT 21 12/15/2011 1322   BILITOT 0.2* 12/17/2011 1605   BILITOT 0.30 12/15/2011 1322       No results found for this basename: LABCA2    No components found with this basename: LABCA125    No results found for this basename: INR:1;PROTIME:1 in the last 168 hours  Urinalysis No results found for this basename: colorurine,  appearanceur,  labspec,  phurine,  glucoseu,  hgbur,  bilirubinur,  ketonesur,  proteinur,  urobilinogen,  nitrite,  leukocytesur    STUDIES: Mammography 06/30/2011 showed some microcalcifications which are probably benign. They will be repeating a mammogram in 6 months.  ASSESSMENT: 61 y.o.   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  PLAN: Her counts are improving steadily, and I think after 2 more cycles I went WILL be in the normal range. We will then do another 2 cycles for consolidation and stop, with the last treatment given December 10. She will then return to see me in February. We will do physical exam a lab work before that visit. Of course the question is how long her remission will last. If it is brief we will have to add likely fludarabine and Cytoxan to her next  of treatment plan.  MAGRINAT,GUSTAV C    01/14/2012

## 2012-01-14 NOTE — Telephone Encounter (Signed)
Per staff message and POF I have scheduled appts.  JMW  

## 2012-01-14 NOTE — Progress Notes (Signed)
1223-vss azerra increased to 400 ml/hr at 1225

## 2012-01-14 NOTE — ED Notes (Signed)
Pt c/o chest tightness, left neck stiffness and HA. States "I had a mild form of chemo today" reports pain started prior to treatment and had 2 episodes while getting chemo. Reports "Now I've had the pain 4 times in a row in 2 hours."

## 2012-01-14 NOTE — ED Provider Notes (Signed)
History     CSN: [id number]  Arrival date & time 01/14/12  2044   First MD Initiated Contact with Patient 01/14/12 2125      Chief Complaint  Patient presents with  . Chest Pain    (Consider location/radiation/quality/duration/timing/severity/associated sxs/prior treatment) HPI Pt with hx of CLL currently undergoing chemotherapy presenting with c/o stabbing pains underneath right breast.  Pt states symptoms began this morning approx 3am.  Pains are sharp and intermittent.  No dificulty breathing,  Pain is not related to palpation or exertion/movement.  No fever or cough.  No leg swelling.  She had chemo this morning and tolerated well.  Per chart review her WBC is improving from 100s down to 20s.  There are no other associated systemic symptoms, there are no other alleviating or modifying factors.   Past Medical History  Diagnosis Date  . Cancer   . Leukemia   . Lower back pain     Past Surgical History  Procedure Date  . Tonsillectomy   . Other surgical history     removal of uterine lining    History reviewed. No pertinent family history.  History  Substance Use Topics  . Smoking status: Never Smoker   . Smokeless tobacco: Not on file  . Alcohol Use: Yes    OB History    Grav Para Term Preterm Abortions TAB SAB Ect Mult Living                  Review of Systems ROS reviewed and all otherwise negative except for mentioned in HPI  Allergies  Penicillins  Home Medications   Current Outpatient Rx  Name  Route  Sig  Dispense  Refill  . ACYCLOVIR 400 MG PO TABS   Oral   Take 400 mg by mouth 2 (two) times daily.         . ALLOPURINOL 300 MG PO TABS   Oral   Take 300 mg by mouth daily.         . ASPIRIN 81 MG PO TABS   Oral   Take 81 mg by mouth daily.         Marland Kitchen CITRACAL PO   Oral   Take 4 tablets by mouth daily.          Marland Kitchen VITAMIN D-3 PO   Oral   Take 4,000 Int'l Units by mouth.         . ESTRADIOL 0.52 MG/0.87 GM (0.06%) TD GEL  Topical   Apply 1 application topically at bedtime.          Marland Kitchen PROMETRIUM PO   Oral   Take 200 mg by mouth. 1st - 12th day of every other month         . SULFAMETHOXAZOLE-TRIMETHOPRIM 800-160 MG PO TABS   Oral   Take 1 tablet by mouth 3 (three) times a week. Take Monday, Wednesday and Friday         . TRAZODONE HCL 50 MG PO TABS   Oral   Take 50 mg by mouth at bedtime.           BP 109/67  Pulse 56  Temp 98 F (36.7 C) (Oral)  Resp 20  SpO2 98% Vitals reviewed Physical Exam Physical Examination: General appearance - alert, well appearing, and in no distress Mental status - alert, oriented to person, place, and time Eyes - no scleral icterus, no conjunctival injection Mouth - mucous membranes moist, pharynx normal without lesions Chest - clear  to auscultation, no wheezes, rales or rhonchi, symmetric air entry, no tenderness to palpation Heart - normal rate, regular rhythm, normal S1, S2, no murmurs, rubs, clicks or gallops Abdomen - soft, nontender, nondistended, no masses or organomegaly, nabs Extremities - peripheral pulses normal, no pedal edema, no clubbing or cyanosis Skin - normal coloration and turgor, no rashes  ED Course  Procedures (including critical care time)   Date: 01/14/2012  Rate: 71  Rhythm: normal sinus rhythm  QRS Axis: normal  Intervals: normal  ST/T Wave abnormalities: normal  Conduction Disutrbances: none  Narrative Interpretation: unremarkable, no significant changes compared to prior ekg of 12/17/11     Labs Reviewed  CBC - Abnormal; Notable for the following:    WBC 28.0 (*)     Hemoglobin 11.6 (*)     HCT 35.8 (*)     All other components within normal limits  COMPREHENSIVE METABOLIC PANEL - Abnormal; Notable for the following:    Glucose, Bld 173 (*)     AST 54 (*)     ALT 43 (*)     Total Bilirubin 0.2 (*)     GFR calc non Af Amer 77 (*)     GFR calc Af Amer 89 (*)     All other components within normal limits    LIPASE, BLOOD  LAB REPORT - SCANNED   Dg Chest 2 View  01/14/2012  *RADIOLOGY REPORT*  Clinical Data: Chest pain  CHEST - 2 VIEW  Comparison: None  Findings: Upper-normal size of cardiac silhouette. Mediastinal contours and pulmonary vascularity normal. Lungs hyperinflated but clear. No pleural effusion or pneumothorax. No acute osseous findings.  IMPRESSION: Hyperinflated lungs without acute infiltrate.   Original Report Authenticated By: Ulyses Southward, M.D.    Ct Angio Chest Pe W/cm &/or Wo Cm  01/14/2012  *RADIOLOGY REPORT*  Clinical Data: Chest pain, CLL.  CT ANGIOGRAPHY CHEST  Technique:  Multidetector CT imaging of the chest using the standard protocol during bolus administration of intravenous contrast. Multiplanar reconstructed images including MIPs were obtained and reviewed to evaluate the vascular anatomy.  Contrast: OMNIPAQUE IOHEXOL 350 MG/ML SOLN  Comparison: 01/14/2012 chest radiograph  Findings: The contrast bolus is suboptimal however felt to be of diagnostic quality. No pulmonary arterial filling defect is identified.  A small focus of gas within the main pulmonary artery is likely iatrogenic.  Normal caliber aorta and branch vessels.  Heart size upper normal.  Coronary artery calcification not excluded due to motion.  No enlarged mediastinal or hilar lymph nodes.  There is an enlarged right axillary lymph node, measuring 1.3 cm short axis.  Limited images through the upper abdomen show no acute finding.  Central airways are patent.  Mild dependent subsegmental atelectasis.  Lungs otherwise clear.  No pneumothorax.  No acute osseous finding.  IMPRESSION: No pulmonary embolism or acute intrathoracic process identified.  Nonspecific enlarged right axillary lymph node. May relate to underlying diagnosis of CLL.   Original Report Authenticated By: Jearld Lesch, M.D.      1. Chest pain       MDM  Pt presents with c/o intermittent sharp pains under right breast.  She has CLL  and is currently on chemotherapy.  EKG reassuring and I have very low suspicion for ACS in this patient.  CXR reassuring- no signs of pneumonia or acute infection.  CT angio ordered to r/o d-dimer in this oncology patient.  She has declined pain medications in the ED.  Awaiting CT angio  to determine disposition.          Ethelda Chick, MD 01/15/12 [phone]

## 2012-01-15 ENCOUNTER — Telehealth: Payer: Self-pay | Admitting: *Deleted

## 2012-01-17 ENCOUNTER — Other Ambulatory Visit: Payer: Self-pay | Admitting: Oncology

## 2012-01-19 ENCOUNTER — Other Ambulatory Visit: Admitting: Lab

## 2012-01-19 ENCOUNTER — Other Ambulatory Visit

## 2012-01-20 ENCOUNTER — Ambulatory Visit (HOSPITAL_BASED_OUTPATIENT_CLINIC_OR_DEPARTMENT_OTHER)

## 2012-01-20 ENCOUNTER — Other Ambulatory Visit: Admitting: Lab

## 2012-01-20 ENCOUNTER — Other Ambulatory Visit (HOSPITAL_BASED_OUTPATIENT_CLINIC_OR_DEPARTMENT_OTHER): Admitting: Lab

## 2012-01-20 VITALS — BP 80/49 | HR 70 | Temp 97.8°F

## 2012-01-20 DIAGNOSIS — Z5112 Encounter for antineoplastic immunotherapy: Secondary | ICD-10-CM

## 2012-01-20 DIAGNOSIS — C9110 Chronic lymphocytic leukemia of B-cell type not having achieved remission: Secondary | ICD-10-CM

## 2012-01-20 LAB — MANUAL DIFFERENTIAL
ALC: 13.5 10*3/uL — ABNORMAL HIGH (ref 0.9–3.3)
Band Neutrophils: 0 % (ref 0–10)
EOS: 1 % (ref 0–7)
LYMPH: 75 % — ABNORMAL HIGH (ref 14–49)
MONO: 2 % (ref 0–14)
Myelocytes: 0 % (ref 0–0)
Other Cell: 0 % (ref 0–0)
PLT EST: ADEQUATE
SEG: 22 % — ABNORMAL LOW (ref 38–77)
Variant Lymph: 0 % (ref 0–0)
nRBC: 0 % (ref 0–0)

## 2012-01-20 LAB — CBC WITH DIFFERENTIAL/PLATELET
HCT: 39 % (ref 34.8–46.6)
MCV: 90.9 fL (ref 79.5–101.0)
Platelets: 171 10*3/uL (ref 145–400)
RBC: 4.29 10*6/uL (ref 3.70–5.45)
WBC: 18.1 10*3/uL — ABNORMAL HIGH (ref 3.9–10.3)

## 2012-01-20 MED ORDER — DIPHENHYDRAMINE HCL 25 MG PO CAPS
25.0000 mg | ORAL_CAPSULE | Freq: Once | ORAL | Status: AC
  Administered 2012-01-20: 25 mg via ORAL

## 2012-01-20 MED ORDER — SODIUM CHLORIDE 0.9 % IV SOLN
2000.0000 mg | Freq: Once | INTRAVENOUS | Status: AC
  Administered 2012-01-20: 2000 mg via INTRAVENOUS
  Filled 2012-01-20: qty 100

## 2012-01-20 MED ORDER — METHYLPREDNISOLONE SODIUM SUCC 125 MG IJ SOLR
125.0000 mg | Freq: Once | INTRAMUSCULAR | Status: AC
  Administered 2012-01-20: 125 mg via INTRAVENOUS

## 2012-01-20 MED ORDER — ACETAMINOPHEN 500 MG PO TABS
1000.0000 mg | ORAL_TABLET | Freq: Once | ORAL | Status: AC
  Administered 2012-01-20: 1000 mg via ORAL

## 2012-01-20 MED ORDER — SODIUM CHLORIDE 0.9 % IV SOLN
Freq: Once | INTRAVENOUS | Status: AC
  Administered 2012-01-20: 09:00:00 via INTRAVENOUS

## 2012-01-20 MED ORDER — SODIUM CHLORIDE 0.45 % IV SOLN
INTRAVENOUS | Status: AC
  Administered 2012-01-20: 09:00:00 via INTRAVENOUS
  Filled 2012-01-20: qty 1000

## 2012-01-20 NOTE — Progress Notes (Signed)
Pt refused famotidine.  TKF

## 2012-01-20 NOTE — Patient Instructions (Addendum)
[facility] Discharge Instructions for Patients Receiving Chemotherapy  Today you received the following chemotherapy agents: azerra   To help prevent nausea and vomiting after your treatment, we encourage you to take your nausea medication.  Take it as often as prescribed.     If you develop nausea and vomiting that is not controlled by your nausea medication, call the clinic. If it is after clinic hours your family physician or the after hours number for the clinic or go to the Emergency Department.   BELOW ARE SYMPTOMS THAT SHOULD BE REPORTED IMMEDIATELY:  *FEVER GREATER THAN 100.5 F  *CHILLS WITH OR WITHOUT FEVER  NAUSEA AND VOMITING THAT IS NOT CONTROLLED WITH YOUR NAUSEA MEDICATION  *UNUSUAL SHORTNESS OF BREATH  *UNUSUAL BRUISING OR BLEEDING  TENDERNESS IN MOUTH AND THROAT WITH OR WITHOUT PRESENCE OF ULCERS  *URINARY PROBLEMS  *BOWEL PROBLEMS  UNUSUAL RASH Items with * indicate a potential emergency and should be followed up as soon as possible.  Feel free to call the clinic you have any questions or concerns. The clinic phone number is [phone].   I have been informed and understand all the instructions given to me. I know to contact the clinic, my physician, or go to the Emergency Department if any problems should occur. I do not have any questions at this time, but understand that I may call the clinic during office hours   should I have any questions or need assistance in obtaining follow up care.    __________________________________________  _____________  __________ Signature of Patient or Authorized Representative            Date                   Time    __________________________________________ Nurse's Signature

## 2012-01-26 ENCOUNTER — Encounter: Payer: Self-pay | Admitting: Pharmacist

## 2012-01-26 ENCOUNTER — Other Ambulatory Visit

## 2012-01-26 ENCOUNTER — Other Ambulatory Visit: Admitting: Lab

## 2012-01-27 ENCOUNTER — Ambulatory Visit (HOSPITAL_BASED_OUTPATIENT_CLINIC_OR_DEPARTMENT_OTHER)

## 2012-01-27 ENCOUNTER — Other Ambulatory Visit (HOSPITAL_BASED_OUTPATIENT_CLINIC_OR_DEPARTMENT_OTHER): Admitting: Lab

## 2012-01-27 ENCOUNTER — Other Ambulatory Visit: Admitting: Lab

## 2012-01-27 VITALS — BP 109/62 | HR 81 | Temp 97.9°F | Resp 18

## 2012-01-27 DIAGNOSIS — Z5112 Encounter for antineoplastic immunotherapy: Secondary | ICD-10-CM

## 2012-01-27 DIAGNOSIS — C9110 Chronic lymphocytic leukemia of B-cell type not having achieved remission: Secondary | ICD-10-CM

## 2012-01-27 LAB — CBC WITH DIFFERENTIAL/PLATELET
BASO%: 0.4 % (ref 0.0–2.0)
Basophils Absolute: 0.1 10*3/uL (ref 0.0–0.1)
EOS%: 1 % (ref 0.0–7.0)
Eosinophils Absolute: 0.2 10*3/uL (ref 0.0–0.5)
HCT: 38.6 % (ref 34.8–46.6)
HGB: 12.5 g/dL (ref 11.6–15.9)
LYMPH%: 69.5 % — ABNORMAL HIGH (ref 14.0–49.7)
MCH: 29.7 pg (ref 25.1–34.0)
MCHC: 32.4 g/dL (ref 31.5–36.0)
MCV: 91.7 fL (ref 79.5–101.0)
MONO#: 1.8 10*3/uL — ABNORMAL HIGH (ref 0.1–0.9)
MONO%: 10.2 % (ref 0.0–14.0)
NEUT#: 3.4 10*3/uL (ref 1.5–6.5)
NEUT%: 18.9 % — ABNORMAL LOW (ref 38.4–76.8)
Platelets: 162 10*3/uL (ref 145–400)
RBC: 4.21 10*6/uL (ref 3.70–5.45)
RDW: 14.2 % (ref 11.2–14.5)
WBC: 17.7 10*3/uL — ABNORMAL HIGH (ref 3.9–10.3)
lymph#: 12.3 10*3/uL — ABNORMAL HIGH (ref 0.9–3.3)
nRBC: 0 % (ref 0–0)

## 2012-01-27 MED ORDER — FAMOTIDINE IN NACL 20-0.9 MG/50ML-% IV SOLN: 20.0000 mg | Freq: Two times a day (BID) | INTRAVENOUS | Status: DC

## 2012-01-27 MED ORDER — DIPHENHYDRAMINE HCL 25 MG PO CAPS
25.0000 mg | ORAL_CAPSULE | Freq: Once | ORAL | Status: AC
  Administered 2012-01-27: 25 mg via ORAL

## 2012-01-27 MED ORDER — ACETAMINOPHEN 500 MG PO TABS
1000.0000 mg | ORAL_TABLET | Freq: Once | ORAL | Status: AC
  Administered 2012-01-27: 1000 mg via ORAL

## 2012-01-27 MED ORDER — SODIUM CHLORIDE 0.45 % IV SOLN
INTRAVENOUS | Status: AC
  Administered 2012-01-27: 10:00:00 via INTRAVENOUS
  Filled 2012-01-27: qty 1000

## 2012-01-27 MED ORDER — SODIUM CHLORIDE 0.9 % IV SOLN: Freq: Once | INTRAVENOUS | Status: DC

## 2012-01-27 MED ORDER — METHYLPREDNISOLONE SODIUM SUCC 125 MG IJ SOLR
125.0000 mg | Freq: Once | INTRAMUSCULAR | Status: AC
Start: 2012-01-27 — End: 2012-01-27
  Administered 2012-01-27: 125 mg via INTRAVENOUS

## 2012-01-27 MED ORDER — SODIUM CHLORIDE 0.9 % IV SOLN
2000.0000 mg | Freq: Once | INTRAVENOUS | Status: AC
  Administered 2012-01-27: 2000 mg via INTRAVENOUS
  Filled 2012-01-27: qty 100

## 2012-01-27 NOTE — Patient Instructions (Signed)
[facility] Discharge Instructions for Patients Receiving Chemotherapy  Today you received the following chemotherapy agents Azerra To help prevent nausea and vomiting after your treatment, we encourage you to take your nausea medication as prescribed.  If you develop nausea and vomiting that is not controlled by your nausea medication, call the clinic. If it is after clinic hours your family physician or the after hours number for the clinic or go to the Emergency Department.   BELOW ARE SYMPTOMS THAT SHOULD BE REPORTED IMMEDIATELY:  *FEVER GREATER THAN 100.5 F  *CHILLS WITH OR WITHOUT FEVER  NAUSEA AND VOMITING THAT IS NOT CONTROLLED WITH YOUR NAUSEA MEDICATION  *UNUSUAL SHORTNESS OF BREATH  *UNUSUAL BRUISING OR BLEEDING  TENDERNESS IN MOUTH AND THROAT WITH OR WITHOUT PRESENCE OF ULCERS  *URINARY PROBLEMS  *BOWEL PROBLEMS  UNUSUAL RASH Items with * indicate a potential emergency and should be followed up as soon as possible.  One of the nurses will contact you 24 hours after your treatment. Please let the nurse know about any problems that you may have experienced. Feel free to call the clinic you have any questions or concerns. The clinic phone number is [phone].   I have been informed and understand all the instructions given to me. I know to contact the clinic, my physician, or go to the Emergency Department if any problems should occur. I do not have any questions at this time, but understand that I may call the clinic during office hours   should I have any questions or need assistance in obtaining follow up care.    __________________________________________  _____________  __________ Signature of Patient or Authorized Representative            Date                   Time    __________________________________________ Nurse's Signature

## 2012-01-27 NOTE — Progress Notes (Signed)
1345 Patient states her legs and left arm felt heavy. Vital signs stable, patient able to move her arms, fingers, toes, and feet upon command. Patient able to smile symmetrically.   1350 After repositioning the patient, patient states she feels better, the heaviness in her arms in legs has improved, patient is crying stating she feels "overloaded". Patient offered to speak with the Chaplain and denies. Patient states she will be ok.

## 2012-02-02 ENCOUNTER — Other Ambulatory Visit: Payer: Self-pay | Admitting: Oncology

## 2012-02-02 ENCOUNTER — Other Ambulatory Visit: Admitting: Lab

## 2012-02-03 ENCOUNTER — Other Ambulatory Visit (HOSPITAL_BASED_OUTPATIENT_CLINIC_OR_DEPARTMENT_OTHER): Admitting: Lab

## 2012-02-03 ENCOUNTER — Ambulatory Visit

## 2012-02-03 ENCOUNTER — Telehealth: Payer: Self-pay | Admitting: *Deleted

## 2012-02-03 ENCOUNTER — Other Ambulatory Visit: Payer: Self-pay | Admitting: Emergency Medicine

## 2012-02-03 ENCOUNTER — Other Ambulatory Visit: Payer: Self-pay | Admitting: Oncology

## 2012-02-03 DIAGNOSIS — C9590 Leukemia, unspecified not having achieved remission: Secondary | ICD-10-CM

## 2012-02-03 DIAGNOSIS — C9110 Chronic lymphocytic leukemia of B-cell type not having achieved remission: Secondary | ICD-10-CM

## 2012-02-03 LAB — CBC WITH DIFFERENTIAL/PLATELET
BASO%: 0.4 % (ref 0.0–2.0)
LYMPH%: 67.2 % — ABNORMAL HIGH (ref 14.0–49.7)
MCHC: 32.9 g/dL (ref 31.5–36.0)
MONO#: 1.9 10*3/uL — ABNORMAL HIGH (ref 0.1–0.9)
MONO%: 10.5 % (ref 0.0–14.0)
NEUT#: 3.7 10*3/uL (ref 1.5–6.5)
Platelets: 163 10*3/uL (ref 145–400)
RBC: 4.23 10*6/uL (ref 3.70–5.45)
RDW: 14.1 % (ref 11.2–14.5)
WBC: 17.6 10*3/uL — ABNORMAL HIGH (ref 3.9–10.3)
nRBC: 0 % (ref 0–0)

## 2012-02-03 LAB — TECHNOLOGIST REVIEW

## 2012-02-03 NOTE — Progress Notes (Signed)
Treatment cancelled.

## 2012-02-03 NOTE — Progress Notes (Signed)
Rachel [name] was seen today briefly at to clarify her treatment plan. As she is still not in complete remission despite receiving the initial treatment with ofatumumab. That is moving to monthly, but we are going to start her on weekly cladribine of as soon as practicable.  There are access problems and therefore she will have a Port-A-Cath placed December 16. The question as to when to start her treatment depends on the fact that her daughter is about to give birth and she really doesn't want to make any commitment until that takes place. She'll get back to [place] on that. Tentatively she has an appointment with me December 17 and we could likely start her treatments late December or early January.

## 2012-02-03 NOTE — Telephone Encounter (Signed)
Per staff message and POF I have scheduled appts.  JMW  

## 2012-02-04 ENCOUNTER — Telehealth: Payer: Self-pay | Admitting: Emergency Medicine

## 2012-02-04 NOTE — Telephone Encounter (Signed)
Patient calling with many concerns about proceeding with another 8 weeks of weekly chemo.  This nurse informed patient that Dr Darnelle Catalan recommends this therapy and feels that by only giving Azerra monthly it will only maintain her blood counts.  Patient states she would like to try some natural remedies instead and is unsure if she is willing to go through 8 more weeks of treatment.  These concerns have been relayed to Dr Darnelle Catalan and patient informed that we will get back to her for further information.

## 2012-02-06 ENCOUNTER — Telehealth: Payer: Self-pay | Admitting: *Deleted

## 2012-02-06 NOTE — Telephone Encounter (Signed)
This RN spoke with pt per concerns with proceeding with further chemotherapy.  Per discussion pt was able to understand goal of therapy was to get abnormal cells as normal as possible before proceeding to maintanance therapy.including use of weekly chemo.  Pt's questions answered including need for IV access with pt stating " I do want a port ".  Note per discussion pt would prefer to wait until post delivery of grandchild due any day with induction scheduled for 12/17.  Per review with MD treatment is not compromised to start post holidays. Pt informed and plan with her decision on dates is for port to be placed 12/30 with chemo to begin on the following Tuesday of 03/02/2012.

## 2012-02-09 ENCOUNTER — Other Ambulatory Visit (HOSPITAL_COMMUNITY)

## 2012-02-10 ENCOUNTER — Ambulatory Visit

## 2012-02-16 ENCOUNTER — Other Ambulatory Visit: Payer: Self-pay | Admitting: Oncology

## 2012-02-16 DIAGNOSIS — C8590 Non-Hodgkin lymphoma, unspecified, unspecified site: Secondary | ICD-10-CM

## 2012-02-16 MED ORDER — METHYLPREDNISOLONE 4 MG PO KIT: PACK | ORAL | Status: DC

## 2012-02-16 NOTE — Progress Notes (Signed)
Rachel [name] came today for an unscheduled visit. For the last 3 days she has had swelling of her upper lip. She thought this was going to be due to a virus, but I really think it is an allergic reaction to something she ate or possibly to make up. I am writing her a Medrol Dosepak, which I think will take care of the problem. She will call back if it does not.

## 2012-02-20 ENCOUNTER — Other Ambulatory Visit: Payer: Self-pay | Admitting: *Deleted

## 2012-02-20 ENCOUNTER — Other Ambulatory Visit: Payer: Self-pay | Admitting: Physician Assistant

## 2012-02-20 ENCOUNTER — Other Ambulatory Visit: Payer: Self-pay | Admitting: Radiology

## 2012-02-23 ENCOUNTER — Encounter (HOSPITAL_COMMUNITY): Payer: Self-pay

## 2012-02-23 ENCOUNTER — Other Ambulatory Visit: Payer: Self-pay | Admitting: Oncology

## 2012-02-23 ENCOUNTER — Ambulatory Visit (HOSPITAL_COMMUNITY)
Admission: RE | Admit: 2012-02-23 | Discharge: 2012-02-23 | Disposition: A | Discharge status: Home / Self Care | Source: Ambulatory Visit | Attending: Oncology | Admitting: Oncology

## 2012-02-23 DIAGNOSIS — C9590 Leukemia, unspecified not having achieved remission: Secondary | ICD-10-CM

## 2012-02-23 LAB — BASIC METABOLIC PANEL
BUN: 18 mg/dL (ref 6–23)
Calcium: 8.9 mg/dL (ref 8.4–10.5)
Creatinine, Ser: 0.71 mg/dL (ref 0.50–1.10)
GFR calc non Af Amer: 90 mL/min — ABNORMAL LOW (ref >90)
Glucose, Bld: 90 mg/dL (ref 70–99)

## 2012-02-23 LAB — PROTIME-INR: Prothrombin Time: 12.9 seconds (ref 11.6–15.2)

## 2012-02-23 LAB — CBC
MCH: 30.1 pg (ref 26.0–34.0)
MCHC: 33.2 g/dL (ref 30.0–36.0)
Platelets: 206 10*3/uL (ref 150–400)
RDW: 12.9 % (ref 11.5–15.5)

## 2012-02-23 MED ORDER — HEPARIN SOD (PORK) LOCK FLUSH 100 UNIT/ML IV SOLN
INTRAVENOUS | Status: AC | PRN
  Administered 2012-02-23: 500 [IU] via INTRAVENOUS

## 2012-02-23 MED ORDER — VANCOMYCIN HCL IN DEXTROSE 1-5 GM/200ML-% IV SOLN
1000.0000 mg | Freq: Once | INTRAVENOUS | Status: AC
  Administered 2012-02-23: 1000 mg via INTRAVENOUS
  Filled 2012-02-23: qty 200

## 2012-02-23 MED ORDER — LIDOCAINE HCL 1 % IJ SOLN
INTRAMUSCULAR | Status: AC
  Filled 2012-02-23: qty 20

## 2012-02-23 MED ORDER — MIDAZOLAM HCL 2 MG/2ML IJ SOLN
INTRAMUSCULAR | Status: AC
  Filled 2012-02-23: qty 6

## 2012-02-23 MED ORDER — FENTANYL CITRATE 0.05 MG/ML IJ SOLN
INTRAMUSCULAR | Status: AC
  Filled 2012-02-23: qty 6

## 2012-02-23 MED ORDER — SODIUM CHLORIDE 0.9 % IV SOLN
Freq: Once | INTRAVENOUS | Status: AC
  Administered 2012-02-23: 14:00:00 via INTRAVENOUS

## 2012-02-23 MED ORDER — FENTANYL CITRATE 0.05 MG/ML IJ SOLN
INTRAMUSCULAR | Status: AC | PRN
  Administered 2012-02-23 (×2): 25 ug via INTRAVENOUS
  Administered 2012-02-23: 50 ug via INTRAVENOUS

## 2012-02-23 MED ORDER — MIDAZOLAM HCL 2 MG/2ML IJ SOLN
INTRAMUSCULAR | Status: AC | PRN
  Administered 2012-02-23: 0.5 mg via INTRAVENOUS
  Administered 2012-02-23: 1 mg via INTRAVENOUS
  Administered 2012-02-23: 0.5 mg via INTRAVENOUS

## 2012-02-23 NOTE — H&P (Signed)
Chief Complaint: "I'm here for a port" Referring Physician:Magrinat HPI: Rachel [name] is an 61 y.o. female with leukemia that is not in complete remission and she will need more chemotherapy. She has had trouble with IV access and is therefore referred for port placement. PMHx and meds reviewed. She states she did not take any recent Medrol dose pak as noted on her med list. No recent fevers, illness.  Past Medical History:  Past Medical History  Diagnosis Date  . Cancer   . Leukemia   . Lower back pain     Past Surgical History:  Past Surgical History  Procedure Date  . Tonsillectomy   . Other surgical history     removal of uterine lining    Family History: No family history on file.  Social History:  reports that she has never smoked. She does not have any smokeless tobacco history on file. She reports that she drinks alcohol. She reports that she does not use illicit drugs.  Allergies:  Allergies  Allergen Reactions  . Penicillins     "drops my heartbeat" "I just pass out"    Medications: Zovirax, Zyloprim, Citrical, Vit D, Estradiol, Progesterone, Desyrel  Please HPI for pertinent positives, otherwise complete 10 system ROS negative.  Physical Exam: BP 105/52  Pulse 54  Temp 97.9 F (36.6 C)  Resp 18  SpO2 98%   General Appearance:  Alert, cooperative, no distress, appears stated age  Head:  Normocephalic, without obvious abnormality, atraumatic  ENT: Unremarkable  Neck: Supple, symmetrical, trachea midline, no adenopathy, thyroid: not enlarged, symmetric, no tenderness/mass/nodules  Lungs:   Clear to auscultation bilaterally, no w/r/r, respirations unlabored without use of accessory muscles.  Chest Wall:  No tenderness or deformity  Heart:  Regular rate and rhythm, S1, S2 normal, no murmur, rub or gallop. Carotids 2+ without bruit.  Neurologic: Normal affect, no gross deficits.   Results for orders placed during the hospital encounter of 02/23/12  (from the past 48 hour(s))  CBC     Status: Abnormal   Collection Time   02/23/12 12:49 PM      Component Value Range Comment   WBC 24.9 (*) 4.0 - 10.5 K/uL    RBC 4.28  3.87 - 5.11 MIL/uL    Hemoglobin 12.9  12.0 - 15.0 g/dL    HCT 16.1  09.6 - 04.5 %    MCV 90.7  78.0 - 100.0 fL    MCH 30.1  26.0 - 34.0 pg    MCHC 33.2  30.0 - 36.0 g/dL    RDW 40.9  81.1 - 91.4 %    Platelets 206  150 - 400 K/uL    No results found.  Assessment/Plan Leukemia requiring further chemotherapy For port placement. Discussed procedure and risks with pt. Labs pending Consent signed in chart  Brayton El PA-C 02/23/2012, 1:10 PM

## 2012-02-23 NOTE — H&P (Signed)
Agree 

## 2012-02-23 NOTE — Procedures (Signed)
Procedure:  Right IJ porta-cath Findings:  Single lumen port via right IJ vein.  Tip at cavoatrial junction.  No PTX.  OK to use.

## 2012-02-26 ENCOUNTER — Other Ambulatory Visit: Payer: Self-pay | Admitting: *Deleted

## 2012-02-26 ENCOUNTER — Telehealth: Payer: Self-pay | Admitting: *Deleted

## 2012-02-26 MED ORDER — LIDOCAINE-PRILOCAINE 2.5-2.5 % EX CREA: TOPICAL_CREAM | CUTANEOUS | Status: DC | PRN

## 2012-02-26 NOTE — Telephone Encounter (Signed)
NEEDS MD APPT PRE TREATMENT- SCHEDULE ON 1/13 AT 330PM WITH GM- AND ON 1/28 AT 9AM AND PUSH TREATMENT TIME BACK- LAB BEFORE EACH APPT- THANKS  Sent michelle email to adjust treatment on 03-23-2012

## 2012-02-26 NOTE — Telephone Encounter (Signed)
Per staff message and POF I have scheduled appts.  JMW  

## 2012-03-02 ENCOUNTER — Ambulatory Visit (HOSPITAL_BASED_OUTPATIENT_CLINIC_OR_DEPARTMENT_OTHER)

## 2012-03-02 ENCOUNTER — Other Ambulatory Visit: Payer: Self-pay | Admitting: Oncology

## 2012-03-02 VITALS — BP 92/54 | HR 76 | Temp 98.9°F

## 2012-03-02 DIAGNOSIS — Z5112 Encounter for antineoplastic immunotherapy: Secondary | ICD-10-CM

## 2012-03-02 DIAGNOSIS — C9110 Chronic lymphocytic leukemia of B-cell type not having achieved remission: Secondary | ICD-10-CM

## 2012-03-02 MED ORDER — SODIUM CHLORIDE 0.9 % IJ SOLN
10.0000 mL | INTRAMUSCULAR | Status: DC | PRN
  Administered 2012-03-02: 10 mL
  Filled 2012-03-02: qty 10

## 2012-03-02 MED ORDER — SODIUM CHLORIDE 0.9 % IV SOLN
Freq: Once | INTRAVENOUS | Status: AC
  Administered 2012-03-02: 09:00:00 via INTRAVENOUS

## 2012-03-02 MED ORDER — HEPARIN SOD (PORK) LOCK FLUSH 100 UNIT/ML IV SOLN
500.0000 [IU] | Freq: Once | INTRAVENOUS | Status: AC | PRN
  Administered 2012-03-02: 500 [IU]
  Filled 2012-03-02: qty 5

## 2012-03-02 MED ORDER — PROCHLORPERAZINE EDISYLATE 5 MG/ML IJ SOLN
10.0000 mg | Freq: Once | INTRAMUSCULAR | Status: AC
  Administered 2012-03-02: 10 mg via INTRAVENOUS

## 2012-03-02 MED ORDER — SODIUM CHLORIDE 0.9 % IV SOLN
0.1500 mg/kg | Freq: Once | INTRAVENOUS | Status: AC
  Administered 2012-03-02: 8 mg via INTRAVENOUS
  Filled 2012-03-02: qty 8

## 2012-03-02 NOTE — Patient Instructions (Addendum)
[facility] Discharge Instructions for Patients Receiving Chemotherapy  Today you received the following chemotherapy agents Cladribine.  To help prevent nausea and vomiting after your treatment, we encourage you to take your nausea medication as prescribed.   If you develop nausea and vomiting that is not controlled by your nausea medication, call the clinic. If it is after clinic hours your family physician or the after hours number for the clinic or go to the Emergency Department.   BELOW ARE SYMPTOMS THAT SHOULD BE REPORTED IMMEDIATELY:  *FEVER GREATER THAN 100.5 F  *CHILLS WITH OR WITHOUT FEVER  NAUSEA AND VOMITING THAT IS NOT CONTROLLED WITH YOUR NAUSEA MEDICATION  *UNUSUAL SHORTNESS OF BREATH  *UNUSUAL BRUISING OR BLEEDING  TENDERNESS IN MOUTH AND THROAT WITH OR WITHOUT PRESENCE OF ULCERS  *URINARY PROBLEMS  *BOWEL PROBLEMS  UNUSUAL RASH Items with * indicate a potential emergency and should be followed up as soon as possible.  One of the nurses will contact you 24 hours after your treatment. Please let the nurse know about any problems that you may have experienced. Feel free to call the clinic you have any questions or concerns. The clinic phone number is [phone].   I have been informed and understand all the instructions given to me. I know to contact the clinic, my physician, or go to the Emergency Department if any problems should occur. I do not have any questions at this time, but understand that I may call the clinic during office hours   should I have any questions or need assistance in obtaining follow up care.    __________________________________________  _____________  __________ Signature of Patient or Authorized Representative            Date                   Time    __________________________________________ Nurse's Signature

## 2012-03-03 ENCOUNTER — Telehealth: Payer: Self-pay | Admitting: *Deleted

## 2012-03-03 NOTE — Telephone Encounter (Signed)
Left message with husband for patient to call if any problems.  vebalized understanding.

## 2012-03-08 ENCOUNTER — Other Ambulatory Visit (HOSPITAL_BASED_OUTPATIENT_CLINIC_OR_DEPARTMENT_OTHER)

## 2012-03-08 ENCOUNTER — Ambulatory Visit (HOSPITAL_BASED_OUTPATIENT_CLINIC_OR_DEPARTMENT_OTHER): Admitting: Oncology

## 2012-03-08 VITALS — BP 104/69 | HR 73 | Temp 97.5°F | Resp 20 | Ht 58.5 in | Wt 121.4 lb

## 2012-03-08 DIAGNOSIS — C9110 Chronic lymphocytic leukemia of B-cell type not having achieved remission: Secondary | ICD-10-CM

## 2012-03-08 LAB — CBC WITH DIFFERENTIAL/PLATELET
Basophils Absolute: 0.1 10*3/uL (ref 0.0–0.1)
EOS%: 2 % (ref 0.0–7.0)
HGB: 12.7 g/dL (ref 11.6–15.9)
MCH: 30.2 pg (ref 25.1–34.0)
MCV: 89.5 fL (ref 79.5–101.0)
MONO%: 9.7 % (ref 0.0–14.0)
RBC: 4.2 10*6/uL (ref 3.70–5.45)
RDW: 12.7 % (ref 11.2–14.5)

## 2012-03-08 LAB — TECHNOLOGIST REVIEW

## 2012-03-08 NOTE — Progress Notes (Signed)
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 Since the last visit here she had her first dose of cladribine. She tolerated it without any side effects that she was aware of. More importantly, her granddaughter Tora Perches was born. She is now about a month old. Rachel [name] spends as much time with her as she can.  REVIEW OF SYSTEMS: When she receives steroids for the ofatumumab she feels agitated into the next morning. She has problems sleeping anyway, she says. She tolerated the port placement well, but still "protects it" some. Her right neck sometimes is a little uncomfortable. She has floaters in the eyes, sinus problems, heartburn, occasionally loose stools (perhaps once a week), urinary incontinence, easy bruising, forgetfulness, and some depression related to the complicated marital situation. She has some hot flashes. Otherwise a detailed review of systems today was noncontributory.  PAST MEDICAL HISTORY: Past Medical History  Diagnosis Date  . Cancer   . Leukemia   . Lower back pain     PAST SURGICAL HISTORY: Past Surgical History  Procedure Date  . Tonsillectomy   . Other surgical history     removal of uterine lining    FAMILY HISTORY No family history on file.  GYNECOLOGIC HISTORY:  SOCIAL HISTORY: Her husband [name] is retired from [employer]. Her son [name] lives in [place] and has 3 sons; he works for a Education officer, environmental. A second son lives in [place] and has two children. Son [name] lives in [place] and has a daughter, as well as 2 sons by marriage.   ADVANCED DIRECTIVES:  HEALTH MAINTENANCE: History  Substance Use Topics  . Smoking status: Never Smoker   . Smokeless tobacco: Not on file  . Alcohol Use: Yes     Colonoscopy: due  PAP: December 2012/ [name]  Bone density: Due  Lipid panel: per Dr Zachery Dauer  Allergies    Allergen Reactions  . Penicillins     "drops my heartbeat" "I just pass out"    Current Outpatient Prescriptions  Medication Sig Dispense Refill  . acyclovir (ZOVIRAX) 400 MG tablet Take 400 mg by mouth 2 (two) times daily.      Marland Kitchen allopurinol (ZYLOPRIM) 300 MG tablet Take 300 mg by mouth daily.      Marland Kitchen aspirin 81 MG tablet Take 81 mg by mouth daily.      . Calcium Citrate (CITRACAL PO) Take 4 tablets by mouth daily.       . Cholecalciferol (VITAMIN D-3 PO) Take 4,000 Int'l Units by mouth.      . Estradiol (ELESTRIN) 0.52 MG/0.87 GM (0.06%) GEL Apply 1 application topically at bedtime.       . lidocaine-prilocaine (EMLA) cream Apply topically as needed. APPLY NICKEL SIZE AMOUNT TO SKIN ON TOP OF PORT- COVER WITH SARAN WRAP AT LEAST 1 HOUR BEFORE ACCESS  30 g  1  . methylPREDNISolone (MEDROL DOSEPAK) 4 MG tablet follow package directions  21 tablet  0  . Progesterone Micronized (PROMETRIUM PO) Take 200 mg by mouth. 1st - 12th day of every other month      . sulfamethoxazole-trimethoprim (BACTRIM DS,SEPTRA DS) 800-160 MG per tablet Take 1 tablet by mouth 3 (three) times a week. Take Monday, Wednesday and Friday      . traZODone (DESYREL) 50 MG tablet Take 50 mg by mouth at bedtime.  OBJECTIVE: Middle-aged white woman in no acute distress Filed Vitals:   03/08/12 1547  BP: 104/69  Pulse: 73  Temp: 97.5 F (36.4 C)  Resp: 20     Body mass index is 24.94 kg/(m^2).    ECOG FS: 1  Sclerae unicteric Oropharynx clear No cervical or supraclavicular adenopathy; there is bilateral axillary adenopathy deep in the axillae, measuring approximately 2 cm  Lungs no rales or rhonchi Heart regular rate and rhythm Abd benign, no splenomegaly, no inguinal adenopathy MSK no focal spinal tenderness, no peripheral edema Neuro: nonfocal; well oriented; depressed affect   LAB RESULTS: Lab Results  Component Value Date   WBC 11.1* 03/08/2012   NEUTROABS 3.6 03/08/2012   HGB 12.7 03/08/2012    HCT 37.6 03/08/2012   MCV 89.5 03/08/2012   PLT 173 03/08/2012      Chemistry      Component Value Date/Time   NA 139 02/23/2012 1249   NA 138 12/15/2011 1322   K 4.2 02/23/2012 1249   K 3.7 12/15/2011 1322   CL 104 02/23/2012 1249   CL 101 12/15/2011 1322   CO2 27 02/23/2012 1249   CO2 26 12/15/2011 1322   BUN 18 02/23/2012 1249   BUN 14.0 12/15/2011 1322   CREATININE 0.71 02/23/2012 1249   CREATININE 1.1 12/15/2011 1322      Component Value Date/Time   CALCIUM 8.9 02/23/2012 1249   CALCIUM 9.9 12/15/2011 1322   ALKPHOS 78 01/14/2012 2153   ALKPHOS 104 12/15/2011 1322   AST 54* 01/14/2012 2153   AST 22 12/15/2011 1322   ALT 43* 01/14/2012 2153   ALT 21 12/15/2011 1322   BILITOT 0.2* 01/14/2012 2153   BILITOT 0.30 12/15/2011 1322       No results found for this basename: LABCA2    No components found with this basename: LABCA125    No results found for this basename: INR:1;PROTIME:1 in the last 168 hours  Urinalysis No results found for this basename: colorurine,  appearanceur,  labspec,  phurine,  glucoseu,  hgbur,  bilirubinur,  ketonesur,  proteinur,  urobilinogen,  nitrite,  leukocytesur    STUDIES: Ir Fluoro Guide Cv Line Right  02/23/2012  *RADIOLOGY REPORT*  Clinical Data: History of leukemia.  Port-A-Cath required for further chemotherapy needs.  IMPLANTED PORT A CATH PLACEMENT WITH ULTRASOUND AND FLUOROSCOPIC GUIDANCE  Sedation:  2.0 mg IV Versed; 100 mcg IV Fentanyl.  Total Moderate Sedation Time:  40 minutes.  Additional Medications:  1 gram IV vancomycin.  Vancomycin was given within two hours of incision.  Vancomycin was given due to an antibiotic allergy.  Fluoroscopy Time:  0.3 minutes.  Procedure:  The procedure, risks, benefits, and alternatives were explained to the patient.  Questions regarding the procedure were encouraged and answered.  The patient understands and consents to the procedure.  The right neck and chest were prepped with chlorhexidine  in a sterile fashion, and a sterile drape was applied covering the operative field.  Maximum barrier sterile technique with sterile gowns and gloves were used for the procedure.  Local anesthesia was provided with 1% lidocaine and lidocaine with epinephrine.  After creating a small venotomy incision, a 21 gauge needle was advanced into the right internal jugular vein under direct, real- time ultrasound guidance.  Ultrasound image documentation was performed.  After securing guidewire access, an 8 Fr dilator was placed.  A J-wire was kinked to measure appropriate catheter length.  A subcutaneous port pocket was then created along the  upper chest wall utilizing sharp and blunt dissection.  Portable cautery was utilized.  The pocket was irrigated with sterile saline.  A single lumen power injectable port was chosen for placement.  The 8 Fr catheter was tunneled from the port pocket site to the venotomy incision.  The port was placed in the pocket and secured with two Ethilon tacking sutures.  External catheter was trimmed to appropriate length based on guidewire measurement.  At the venotomy, an 8 Fr peel-away sheath was placed over a guidewire.  The catheter was then placed through the sheath and the sheath removed.  Final catheter positioning was confirmed and documented with a fluoroscopic spot image.  The port was accessed with a needle and aspirated and flushed with heparinized saline. The needle was removed.  The venotomy and port pocket incisions were closed with subcutaneous 3-0 Monocryl and subcuticular 4-0 Vicryl.  Dermabond was applied to both incisions.  Complications: None.  No pneumothorax.  Findings:  After catheter placement, the tip lies at the cavoatrial junction.  The catheter aspirates normally and is ready for immediate use.  IMPRESSION:  Placement of single lumen port a cath via right internal jugular vein.  The catheter tip lies at the cavoatrial junction.  A power injectable port a cath was  placed and is ready for immediate use.   Original Report Authenticated By: Irish Lack, M.D.    Ir US Guide Vasc Access Right  02/23/2012  *RADIOLOGY REPORT*  Clinical Data: History of leukemia.  Port-A-Cath required for further chemotherapy needs.  IMPLANTED PORT A CATH PLACEMENT WITH ULTRASOUND AND FLUOROSCOPIC GUIDANCE  Sedation:  2.0 mg IV Versed; 100 mcg IV Fentanyl.  Total Moderate Sedation Time:  40 minutes.  Additional Medications:  1 gram IV vancomycin.  Vancomycin was given within two hours of incision.  Vancomycin was given due to an antibiotic allergy.  Fluoroscopy Time:  0.3 minutes.  Procedure:  The procedure, risks, benefits, and alternatives were explained to the patient.  Questions regarding the procedure were encouraged and answered.  The patient understands and consents to the procedure.  The right neck and chest were prepped with chlorhexidine in a sterile fashion, and a sterile drape was applied covering the operative field.  Maximum barrier sterile technique with sterile gowns and gloves were used for the procedure.  Local anesthesia was provided with 1% lidocaine and lidocaine with epinephrine.  After creating a small venotomy incision, a 21 gauge needle was advanced into the right internal jugular vein under direct, real- time ultrasound guidance.  Ultrasound image documentation was performed.  After securing guidewire access, an 8 Fr dilator was placed.  A J-wire was kinked to measure appropriate catheter length.  A subcutaneous port pocket was then created along the upper chest wall utilizing sharp and blunt dissection.  Portable cautery was utilized.  The pocket was irrigated with sterile saline.  A single lumen power injectable port was chosen for placement.  The 8 Fr catheter was tunneled from the port pocket site to the venotomy incision.  The port was placed in the pocket and secured with two Ethilon tacking sutures.  External catheter was trimmed to appropriate length based on  guidewire measurement.  At the venotomy, an 8 Fr peel-away sheath was placed over a guidewire.  The catheter was then placed through the sheath and the sheath removed.  Final catheter positioning was confirmed and documented with a fluoroscopic spot image.  The port was accessed with a needle and aspirated and flushed with  heparinized saline. The needle was removed.  The venotomy and port pocket incisions were closed with subcutaneous 3-0 Monocryl and subcuticular 4-0 Vicryl.  Dermabond was applied to both incisions.  Complications: None.  No pneumothorax.  Findings:  After catheter placement, the tip lies at the cavoatrial junction.  The catheter aspirates normally and is ready for immediate use.  IMPRESSION:  Placement of single lumen port a cath via right internal jugular vein.  The catheter tip lies at the cavoatrial junction.  A power injectable port a cath was placed and is ready for immediate use.   Original Report Authenticated By: Irish Lack, M.D.     ASSESSMENT: 62 y.o.  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 This is very promising. The plan is to continue the cladribine to a total of 6 weekly treatments, and the offered to him him up for 4 monthly treatments.  There does not seem to be a resolution to the marital issues. On the plus side, she is really enjoying spending time with her granddaughter. She knows to call for any problems that may develop before the next visit. Isha Seefeld C    03/08/2012

## 2012-03-09 ENCOUNTER — Other Ambulatory Visit: Payer: Self-pay | Admitting: *Deleted

## 2012-03-09 ENCOUNTER — Ambulatory Visit (HOSPITAL_BASED_OUTPATIENT_CLINIC_OR_DEPARTMENT_OTHER)

## 2012-03-09 ENCOUNTER — Telehealth: Payer: Self-pay | Admitting: *Deleted

## 2012-03-09 VITALS — BP 98/62 | HR 83 | Temp 97.8°F | Resp 18

## 2012-03-09 DIAGNOSIS — Z5112 Encounter for antineoplastic immunotherapy: Secondary | ICD-10-CM

## 2012-03-09 DIAGNOSIS — C9110 Chronic lymphocytic leukemia of B-cell type not having achieved remission: Secondary | ICD-10-CM

## 2012-03-09 MED ORDER — SODIUM CHLORIDE 0.9 % IJ SOLN
10.0000 mL | INTRAMUSCULAR | Status: DC | PRN
  Administered 2012-03-09: 10 mL
  Filled 2012-03-09: qty 10

## 2012-03-09 MED ORDER — SODIUM CHLORIDE 0.9 % IV SOLN
0.1500 mg/kg | Freq: Once | INTRAVENOUS | Status: AC
  Administered 2012-03-09: 8 mg via INTRAVENOUS
  Filled 2012-03-09: qty 8

## 2012-03-09 MED ORDER — HEPARIN SOD (PORK) LOCK FLUSH 100 UNIT/ML IV SOLN
500.0000 [IU] | Freq: Once | INTRAVENOUS | Status: AC | PRN
  Administered 2012-03-09: 500 [IU]
  Filled 2012-03-09: qty 5

## 2012-03-09 MED ORDER — PROCHLORPERAZINE EDISYLATE 5 MG/ML IJ SOLN
10.0000 mg | Freq: Once | INTRAMUSCULAR | Status: AC
  Administered 2012-03-09: 10 mg via INTRAVENOUS

## 2012-03-09 MED ORDER — DIPHENHYDRAMINE HCL 25 MG PO CAPS
50.0000 mg | ORAL_CAPSULE | Freq: Once | ORAL | Status: AC
  Administered 2012-03-09: 50 mg via ORAL

## 2012-03-09 MED ORDER — SODIUM CHLORIDE 0.9 % IV SOLN
Freq: Once | INTRAVENOUS | Status: AC
  Administered 2012-03-09: 09:00:00 via INTRAVENOUS

## 2012-03-09 MED ORDER — SODIUM CHLORIDE 0.9 % IV SOLN
2000.0000 mg | Freq: Once | INTRAVENOUS | Status: AC
  Administered 2012-03-09: 2000 mg via INTRAVENOUS
  Filled 2012-03-09: qty 100

## 2012-03-09 MED ORDER — ONDANSETRON HCL 8 MG PO TABS: 8.0000 mg | ORAL_TABLET | Freq: Two times a day (BID) | ORAL | Status: DC | PRN

## 2012-03-09 MED ORDER — METHYLPREDNISOLONE SODIUM SUCC 125 MG IJ SOLR
125.0000 mg | Freq: Once | INTRAMUSCULAR | Status: AC
  Administered 2012-03-09: 125 mg via INTRAVENOUS

## 2012-03-09 MED ORDER — ACETAMINOPHEN 500 MG PO TABS
1000.0000 mg | ORAL_TABLET | Freq: Once | ORAL | Status: AC
  Administered 2012-03-09: 1000 mg via ORAL

## 2012-03-09 NOTE — Patient Instructions (Signed)
[facility] Discharge Instructions for Patients Receiving Chemotherapy  Today you received the following chemotherapy agents: Arzerra and Cladribine.  To help prevent nausea and vomiting after your treatment, we encourage you to take your nausea medication as needed.   If you develop nausea and vomiting that is not controlled by your nausea medication, call the clinic. If it is after clinic hours your family physician or the after hours number for the clinic or go to the Emergency Department.   BELOW ARE SYMPTOMS THAT SHOULD BE REPORTED IMMEDIATELY:  *FEVER GREATER THAN 100.5 F  *CHILLS WITH OR WITHOUT FEVER  NAUSEA AND VOMITING THAT IS NOT CONTROLLED WITH YOUR NAUSEA MEDICATION  *UNUSUAL SHORTNESS OF BREATH  *UNUSUAL BRUISING OR BLEEDING  TENDERNESS IN MOUTH AND THROAT WITH OR WITHOUT PRESENCE OF ULCERS  *URINARY PROBLEMS  *BOWEL PROBLEMS  UNUSUAL RASH Items with * indicate a potential emergency and should be followed up as soon as possible.  Feel free to call the clinic you have any questions or concerns. The clinic phone number is [phone].   I have been informed and understand all the instructions given to me. I know to contact the clinic, my physician, or go to the Emergency Department if any problems should occur. I do not have any questions at this time, but understand that I may call the clinic during office hours   should I have any questions or need assistance in obtaining follow up care.

## 2012-03-09 NOTE — Progress Notes (Signed)
Pt hypotensive prior to administration of Arzerra. Reviewed with Misty Stanley, PharmD and Dr. Darnelle Catalan. OK to run IV fluids simultaneously for BP support. Normal saline infusing at 100 mL/ hr.

## 2012-03-09 NOTE — Telephone Encounter (Signed)
Per patient request I have moved lab appt to later. JMW

## 2012-03-10 ENCOUNTER — Telehealth: Payer: Self-pay | Admitting: *Deleted

## 2012-03-10 NOTE — Telephone Encounter (Signed)
Patient called and left message regarding her appts for 1/28. Patient not happy with her schedule, and request change. Patient wants less time in between appts, or lab/MD appt moved to day before treatment. I have called the patient back and spoke with her. Dr. Darnelle Catalan has no appt on 1/27. Patient asked if can be seen on 1/24, MD off that day. Patient than requested to have MD visit on 1/29 day after treatment, I have explained to the patient that she needs to be seen before treatment. Patient stated that "I don't want to wait more than 30 minutes." I explained that her lab is at 830, then will see Dr. Darnelle Catalan at 900 then treatment at 10. Patient will keep appts. Desk RN notified. JMW

## 2012-03-16 ENCOUNTER — Other Ambulatory Visit (HOSPITAL_BASED_OUTPATIENT_CLINIC_OR_DEPARTMENT_OTHER): Admitting: Lab

## 2012-03-16 ENCOUNTER — Ambulatory Visit (HOSPITAL_BASED_OUTPATIENT_CLINIC_OR_DEPARTMENT_OTHER)

## 2012-03-16 VITALS — BP 93/59 | HR 76 | Temp 98.3°F | Resp 20

## 2012-03-16 DIAGNOSIS — F419 Anxiety disorder, unspecified: Secondary | ICD-10-CM

## 2012-03-16 DIAGNOSIS — C9110 Chronic lymphocytic leukemia of B-cell type not having achieved remission: Secondary | ICD-10-CM

## 2012-03-16 DIAGNOSIS — Z5111 Encounter for antineoplastic chemotherapy: Secondary | ICD-10-CM

## 2012-03-16 LAB — CBC WITH DIFFERENTIAL/PLATELET
Basophils Absolute: 0.1 10*3/uL (ref 0.0–0.1)
EOS%: 2.3 % (ref 0.0–7.0)
HCT: 41.1 % (ref 34.8–46.6)
HGB: 13.6 g/dL (ref 11.6–15.9)
MONO#: 0.9 10*3/uL (ref 0.1–0.9)
MONO%: 9.3 % (ref 0.0–14.0)
NEUT#: 1.6 10*3/uL (ref 1.5–6.5)
Platelets: 189 10*3/uL (ref 145–400)
WBC: 9.9 10*3/uL (ref 3.9–10.3)
lymph#: 7.1 10*3/uL — ABNORMAL HIGH (ref 0.9–3.3)
nRBC: 0 % (ref 0–0)

## 2012-03-16 LAB — TECHNOLOGIST REVIEW

## 2012-03-16 MED ORDER — SODIUM CHLORIDE 0.9 % IJ SOLN
10.0000 mL | INTRAMUSCULAR | Status: DC | PRN
  Administered 2012-03-16: 10 mL
  Filled 2012-03-16: qty 10

## 2012-03-16 MED ORDER — PROCHLORPERAZINE EDISYLATE 5 MG/ML IJ SOLN
10.0000 mg | Freq: Once | INTRAMUSCULAR | Status: AC
  Administered 2012-03-16: 10 mg via INTRAVENOUS

## 2012-03-16 MED ORDER — HEPARIN SOD (PORK) LOCK FLUSH 100 UNIT/ML IV SOLN
500.0000 [IU] | Freq: Once | INTRAVENOUS | Status: AC | PRN
  Administered 2012-03-16: 500 [IU]
  Filled 2012-03-16: qty 5

## 2012-03-16 MED ORDER — LORAZEPAM 2 MG/ML IJ SOLN: 0.5000 mg | Freq: Once | INTRAMUSCULAR | Status: DC

## 2012-03-16 MED ORDER — SODIUM CHLORIDE 0.9 % IV SOLN
0.1500 mg/kg | Freq: Once | INTRAVENOUS | Status: AC
  Administered 2012-03-16: 8 mg via INTRAVENOUS
  Filled 2012-03-16: qty 8

## 2012-03-16 MED ORDER — SODIUM CHLORIDE 0.9 % IV SOLN
Freq: Once | INTRAVENOUS | Status: AC
  Administered 2012-03-16: 09:00:00 via INTRAVENOUS

## 2012-03-16 NOTE — Patient Instructions (Addendum)
[facility] Discharge Instructions for Patients Receiving Chemotherapy  Today you received the following chemotherapy agents leustatin  To help prevent nausea and vomiting after your treatment, we encourage you to take your nausea medication  and take it as often as prescribed   If you develop nausea and vomiting that is not controlled by your nausea medication, call the clinic. If it is after clinic hours your family physician or the after hours number for the clinic or go to the Emergency Department.   BELOW ARE SYMPTOMS THAT SHOULD BE REPORTED IMMEDIATELY:  *FEVER GREATER THAN 100.5 F  *CHILLS WITH OR WITHOUT FEVER  NAUSEA AND VOMITING THAT IS NOT CONTROLLED WITH YOUR NAUSEA MEDICATION  *UNUSUAL SHORTNESS OF BREATH  *UNUSUAL BRUISING OR BLEEDING  TENDERNESS IN MOUTH AND THROAT WITH OR WITHOUT PRESENCE OF ULCERS  *URINARY PROBLEMS  *BOWEL PROBLEMS  UNUSUAL RASH Items with * indicate a potential emergency and should be followed up as soon as possible.  One of the nurses will contact you 24 hours after your treatment. Please let the nurse know about any problems that you may have experienced. Feel free to call the clinic you have any questions or concerns. The clinic phone number is [phone].   I have been informed and understand all the instructions given to me. I know to contact the clinic, my physician, or go to the Emergency Department if any problems should occur. I do not have any questions at this time, but understand that I may call the clinic during office hours   should I have any questions or need assistance in obtaining follow up care.    __________________________________________  _____________  __________ Signature of Patient or Authorized Representative            Date                   Time    __________________________________________ Nurse's Signature

## 2012-03-16 NOTE — Progress Notes (Signed)
0900 pt states she is feeling very anxious, and request medication.  Dr Darnelle Catalan paged.ordered ativan 0.5mg .  Pt then refused medication.  dmr

## 2012-03-18 ENCOUNTER — Other Ambulatory Visit: Payer: Self-pay | Admitting: *Deleted

## 2012-03-19 ENCOUNTER — Telehealth: Payer: Self-pay | Admitting: *Deleted

## 2012-03-19 ENCOUNTER — Telehealth: Payer: Self-pay | Admitting: Oncology

## 2012-03-19 NOTE — Telephone Encounter (Signed)
Pt will get new schedule at nxt visit....emailed michelle to add tx

## 2012-03-19 NOTE — Telephone Encounter (Signed)
Per staff message and POF I have scheduled appt for 2/11. Patient also to have appt for 2/4, but with both leustatin and azerra appt will be to long. I have emailed the Network engineer. Waiting back to hear.  JMW

## 2012-03-22 ENCOUNTER — Telehealth: Payer: Self-pay | Admitting: Oncology

## 2012-03-22 ENCOUNTER — Telehealth: Payer: Self-pay | Admitting: *Deleted

## 2012-03-22 NOTE — Telephone Encounter (Signed)
I have schedueld appts. JMW

## 2012-03-22 NOTE — Telephone Encounter (Signed)
lvm for pt regarding to 1.28.14 appt...Marland KitchenMarland Kitchen

## 2012-03-23 ENCOUNTER — Ambulatory Visit (HOSPITAL_BASED_OUTPATIENT_CLINIC_OR_DEPARTMENT_OTHER): Admitting: Oncology

## 2012-03-23 ENCOUNTER — Other Ambulatory Visit (HOSPITAL_BASED_OUTPATIENT_CLINIC_OR_DEPARTMENT_OTHER): Admitting: Lab

## 2012-03-23 ENCOUNTER — Ambulatory Visit (HOSPITAL_BASED_OUTPATIENT_CLINIC_OR_DEPARTMENT_OTHER)

## 2012-03-23 VITALS — BP 97/65 | HR 74 | Temp 97.8°F | Resp 20 | Ht 58.5 in | Wt 121.0 lb

## 2012-03-23 DIAGNOSIS — C9110 Chronic lymphocytic leukemia of B-cell type not having achieved remission: Secondary | ICD-10-CM

## 2012-03-23 DIAGNOSIS — Z5111 Encounter for antineoplastic chemotherapy: Secondary | ICD-10-CM

## 2012-03-23 LAB — CBC WITH DIFFERENTIAL/PLATELET
Basophils Absolute: 0.1 10*3/uL (ref 0.0–0.1)
Eosinophils Absolute: 0.2 10*3/uL (ref 0.0–0.5)
HGB: 13.3 g/dL (ref 11.6–15.9)
LYMPH%: 61.5 % — ABNORMAL HIGH (ref 14.0–49.7)
MCV: 90.5 fL (ref 79.5–101.0)
MONO#: 1 10*3/uL — ABNORMAL HIGH (ref 0.1–0.9)
MONO%: 10.7 % (ref 0.0–14.0)
NEUT#: 2.2 10*3/uL (ref 1.5–6.5)
Platelets: 207 10*3/uL (ref 145–400)
RBC: 4.41 10*6/uL (ref 3.70–5.45)
RDW: 12.8 % (ref 11.2–14.5)
WBC: 8.9 10*3/uL (ref 3.9–10.3)
nRBC: 0 % (ref 0–0)

## 2012-03-23 LAB — TECHNOLOGIST REVIEW

## 2012-03-23 MED ORDER — SODIUM CHLORIDE 0.9 % IV SOLN
0.1500 mg/kg | Freq: Once | INTRAVENOUS | Status: AC
  Administered 2012-03-23: 8 mg via INTRAVENOUS
  Filled 2012-03-23: qty 8

## 2012-03-23 MED ORDER — SODIUM CHLORIDE 0.9 % IV SOLN
Freq: Once | INTRAVENOUS | Status: AC
  Administered 2012-03-23: 10:00:00 via INTRAVENOUS

## 2012-03-23 MED ORDER — PROCHLORPERAZINE EDISYLATE 5 MG/ML IJ SOLN
10.0000 mg | Freq: Once | INTRAMUSCULAR | Status: AC
  Administered 2012-03-23: 10 mg via INTRAVENOUS

## 2012-03-23 MED ORDER — HEPARIN SOD (PORK) LOCK FLUSH 100 UNIT/ML IV SOLN
500.0000 [IU] | Freq: Once | INTRAVENOUS | Status: AC | PRN
  Administered 2012-03-23: 500 [IU]
  Filled 2012-03-23: qty 5

## 2012-03-23 MED ORDER — SODIUM CHLORIDE 0.9 % IJ SOLN
10.0000 mL | INTRAMUSCULAR | Status: DC | PRN
  Administered 2012-03-23: 10 mL
  Filled 2012-03-23: qty 10

## 2012-03-23 NOTE — Progress Notes (Signed)
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 She is tolerating the cladribine well. She still has some tenderness over her port site and particularly notices that when she does water aerobics. She is planning a trip to [place] in August. At this point she is considering keeping the port long-term.  REVIEW OF SYSTEMS: She is not unusually fatigued, is not having fevers, rash, bleeding, or other systemic symptoms. She has not lost her hair. In fact her functional status is entirely normal at this point. A detailed review of systems was significant for anxiety regarding persistent adenopathy in the right neck and left axilla.  PAST MEDICAL HISTORY: Past Medical History  Diagnosis Date  . Cancer   . Leukemia   . Lower back pain     PAST SURGICAL HISTORY: Past Surgical History  Procedure Date  . Tonsillectomy   . Other surgical history     removal of uterine lining    FAMILY HISTORY No family history on file.  GYNECOLOGIC HISTORY:  SOCIAL HISTORY: Her husband [name] is retired from [employer]. Her son [name] lives in [place] and has 3 sons; he works for a [employer], environmental. A second son lives in [place] and has two children. Son [name] lives in [place] and has a daughter, as well as 2 sons by marriage.   ADVANCED DIRECTIVES:  HEALTH MAINTENANCE: History  Substance Use Topics  . Smoking status: Never Smoker   . Smokeless tobacco: Not on file  . Alcohol Use: Yes     Colonoscopy: due  PAP: December 2012/ [name]  Bone density: Due  Lipid panel: per Dr Zachery Dauer  Allergies  Allergen Reactions  . Penicillins     "drops my heartbeat" "I just pass out"    Current Outpatient Prescriptions  Medication Sig Dispense Refill  . acyclovir (ZOVIRAX) 400 MG tablet Take 400 mg by mouth 2 (two) times daily.      Marland Kitchen  allopurinol (ZYLOPRIM) 300 MG tablet Take 300 mg by mouth daily.      Marland Kitchen aspirin 81 MG tablet Take 81 mg by mouth daily.      . Calcium Citrate (CITRACAL PO) Take 4 tablets by mouth daily.       . Cholecalciferol (VITAMIN D-3 PO) Take 4,000 Int'l Units by mouth.      . Estradiol (ELESTRIN) 0.52 MG/0.87 GM (0.06%) GEL Apply 1 application topically at bedtime.       . lidocaine-prilocaine (EMLA) cream Apply topically as needed. APPLY NICKEL SIZE AMOUNT TO SKIN ON TOP OF PORT- COVER WITH SARAN WRAP AT LEAST 1 HOUR BEFORE ACCESS  30 g  1  . methylPREDNISolone (MEDROL DOSEPAK) 4 MG tablet follow package directions  21 tablet  0  . ondansetron (ZOFRAN) 8 MG tablet Take 1 tablet (8 mg total) by mouth every 12 (twelve) hours as needed for nausea.  20 tablet  1  . Progesterone Micronized (PROMETRIUM PO) Take 200 mg by mouth. 1st - 12th day of every other month      . sulfamethoxazole-trimethoprim (BACTRIM DS,SEPTRA DS) 800-160 MG per tablet Take 1 tablet by mouth 3 (three) times a week. Take Monday, Wednesday and Friday      . traZODone (DESYREL) 50 MG tablet Take 50 mg by mouth at bedtime.        OBJECTIVE: Middle-aged white woman in no  acute distress Filed Vitals:   03/23/12 0858  BP: 97/65  Pulse: 74  Temp: 97.8 F (36.6 C)  Resp: 20     Body mass index is 24.86 kg/(m^2).    ECOG FS: 1  Sclerae unicteric Oropharynx clear There is a 1 cm flat movable lymph node in the right supraclavicular area. I do not palpate any other significant cervical adenopathy. In the left axilla there is palpable adenopathy measuring maximally 2 cm. It is movable and nontender. Lungs no rales or rhonchi Heart regular rate and rhythm Abd benign, no splenomegaly, no inguinal adenopathy MSK no focal spinal tenderness, no peripheral edema Neuro: nonfocal; well oriented; positive affect Breast exam: deferred   LAB RESULTS: Lab Results  Component Value Date   WBC 8.9 03/23/2012   NEUTROABS 2.2 03/23/2012   HGB  13.3 03/23/2012   HCT 39.9 03/23/2012   MCV 90.5 03/23/2012   PLT 207 03/23/2012      Chemistry      Component Value Date/Time   NA 139 02/23/2012 1249   NA 138 12/15/2011 1322   K 4.2 02/23/2012 1249   K 3.7 12/15/2011 1322   CL 104 02/23/2012 1249   CL 101 12/15/2011 1322   CO2 27 02/23/2012 1249   CO2 26 12/15/2011 1322   BUN 18 02/23/2012 1249   BUN 14.0 12/15/2011 1322   CREATININE 0.71 02/23/2012 1249   CREATININE 1.1 12/15/2011 1322      Component Value Date/Time   CALCIUM 8.9 02/23/2012 1249   CALCIUM 9.9 12/15/2011 1322   ALKPHOS 78 01/14/2012 2153   ALKPHOS 104 12/15/2011 1322   AST 54* 01/14/2012 2153   AST 22 12/15/2011 1322   ALT 43* 01/14/2012 2153   ALT 21 12/15/2011 1322   BILITOT 0.2* 01/14/2012 2153   BILITOT 0.30 12/15/2011 1322       No results found for this basename: LABCA2    No components found with this basename: LABCA125    No results found for this basename: INR:1;PROTIME:1 in the last 168 hours  Urinalysis No results found for this basename: colorurine,  appearanceur,  labspec,  phurine,  glucoseu,  hgbur,  bilirubinur,  ketonesur,  proteinur,  urobilinogen,  nitrite,  leukocytesur    STUDIES: Ir Fluoro Guide Cv Line Right  02/23/2012  *RADIOLOGY REPORT*  Clinical Data: History of leukemia.  Port-A-Cath required for further chemotherapy needs.  IMPLANTED PORT A CATH PLACEMENT WITH ULTRASOUND AND FLUOROSCOPIC GUIDANCE  Sedation:  2.0 mg IV Versed; 100 mcg IV Fentanyl.  Total Moderate Sedation Time:  40 minutes.  Additional Medications:  1 gram IV vancomycin.  Vancomycin was given within two hours of incision.  Vancomycin was given due to an antibiotic allergy.  Fluoroscopy Time:  0.3 minutes.  Procedure:  The procedure, risks, benefits, and alternatives were explained to the patient.  Questions regarding the procedure were encouraged and answered.  The patient understands and consents to the procedure.  The right neck and chest were prepped  with chlorhexidine in a sterile fashion, and a sterile drape was applied covering the operative field.  Maximum barrier sterile technique with sterile gowns and gloves were used for the procedure.  Local anesthesia was provided with 1% lidocaine and lidocaine with epinephrine.  After creating a small venotomy incision, a 21 gauge needle was advanced into the right internal jugular vein under direct, real- time ultrasound guidance.  Ultrasound image documentation was performed.  After securing guidewire access, an 8 Fr dilator was placed.  A  J-wire was kinked to measure appropriate catheter length.  A subcutaneous port pocket was then created along the upper chest wall utilizing sharp and blunt dissection.  Portable cautery was utilized.  The pocket was irrigated with sterile saline.  A single lumen power injectable port was chosen for placement.  The 8 Fr catheter was tunneled from the port pocket site to the venotomy incision.  The port was placed in the pocket and secured with two Ethilon tacking sutures.  External catheter was trimmed to appropriate length based on guidewire measurement.  At the venotomy, an 8 Fr peel-away sheath was placed over a guidewire.  The catheter was then placed through the sheath and the sheath removed.  Final catheter positioning was confirmed and documented with a fluoroscopic spot image.  The port was accessed with a needle and aspirated and flushed with heparinized saline. The needle was removed.  The venotomy and port pocket incisions were closed with subcutaneous 3-0 Monocryl and subcuticular 4-0 Vicryl.  Dermabond was applied to both incisions.  Complications: None.  No pneumothorax.  Findings:  After catheter placement, the tip lies at the cavoatrial junction.  The catheter aspirates normally and is ready for immediate use.  IMPRESSION:  Placement of single lumen port a cath via right internal jugular vein.  The catheter tip lies at the cavoatrial junction.  A power injectable  port a cath was placed and is ready for immediate use.   Original Report Authenticated By: Irish Lack, M.D.    Ir US Guide Vasc Access Right  02/23/2012  *RADIOLOGY REPORT*  Clinical Data: History of leukemia.  Port-A-Cath required for further chemotherapy needs.  IMPLANTED PORT A CATH PLACEMENT WITH ULTRASOUND AND FLUOROSCOPIC GUIDANCE  Sedation:  2.0 mg IV Versed; 100 mcg IV Fentanyl.  Total Moderate Sedation Time:  40 minutes.  Additional Medications:  1 gram IV vancomycin.  Vancomycin was given within two hours of incision.  Vancomycin was given due to an antibiotic allergy.  Fluoroscopy Time:  0.3 minutes.  Procedure:  The procedure, risks, benefits, and alternatives were explained to the patient.  Questions regarding the procedure were encouraged and answered.  The patient understands and consents to the procedure.  The right neck and chest were prepped with chlorhexidine in a sterile fashion, and a sterile drape was applied covering the operative field.  Maximum barrier sterile technique with sterile gowns and gloves were used for the procedure.  Local anesthesia was provided with 1% lidocaine and lidocaine with epinephrine.  After creating a small venotomy incision, a 21 gauge needle was advanced into the right internal jugular vein under direct, real- time ultrasound guidance.  Ultrasound image documentation was performed.  After securing guidewire access, an 8 Fr dilator was placed.  A J-wire was kinked to measure appropriate catheter length.  A subcutaneous port pocket was then created along the upper chest wall utilizing sharp and blunt dissection.  Portable cautery was utilized.  The pocket was irrigated with sterile saline.  A single lumen power injectable port was chosen for placement.  The 8 Fr catheter was tunneled from the port pocket site to the venotomy incision.  The port was placed in the pocket and secured with two Ethilon tacking sutures.  External catheter was trimmed to appropriate  length based on guidewire measurement.  At the venotomy, an 8 Fr peel-away sheath was placed over a guidewire.  The catheter was then placed through the sheath and the sheath removed.  Final catheter positioning was confirmed and documented  with a fluoroscopic spot image.  The port was accessed with a needle and aspirated and flushed with heparinized saline. The needle was removed.  The venotomy and port pocket incisions were closed with subcutaneous 3-0 Monocryl and subcuticular 4-0 Vicryl.  Dermabond was applied to both incisions.  Complications: None.  No pneumothorax.  Findings:  After catheter placement, the tip lies at the cavoatrial junction.  The catheter aspirates normally and is ready for immediate use.  IMPRESSION:  Placement of single lumen port a cath via right internal jugular vein.  The catheter tip lies at the cavoatrial junction.  A power injectable port a cath was placed and is ready for immediate use.   Original Report Authenticated By: Irish Lack, M.D.     ASSESSMENT: 62 y.o.  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 She will continue the ofatumumab monthly. I am pleased with her response to date, although we are not in complete remission. I do not think we should intensify therapy in the absence of systemic symptoms, with a normal white cell count, hemoglobin, and platelet count. She knows to call for any problems that may develop before the next visit. MAGRINAT,GUSTAV C    03/23/2012

## 2012-03-23 NOTE — Patient Instructions (Addendum)
[facility] Discharge Instructions for Patients Receiving Chemotherapy  Today you received the following chemotherapy agents Cladribine  To help prevent nausea and vomiting after your treatment, we encourage you to take your nausea medication as directed.   If you develop nausea and vomiting that is not controlled by your nausea medication, call the clinic. If it is after clinic hours your family physician or the after hours number for the clinic or go to the Emergency Department.   BELOW ARE SYMPTOMS THAT SHOULD BE REPORTED IMMEDIATELY:  *FEVER GREATER THAN 100.5 F  *CHILLS WITH OR WITHOUT FEVER  NAUSEA AND VOMITING THAT IS NOT CONTROLLED WITH YOUR NAUSEA MEDICATION  *UNUSUAL SHORTNESS OF BREATH  *UNUSUAL BRUISING OR BLEEDING  TENDERNESS IN MOUTH AND THROAT WITH OR WITHOUT PRESENCE OF ULCERS  *URINARY PROBLEMS  *BOWEL PROBLEMS  UNUSUAL RASH Items with * indicate a potential emergency and should be followed up as soon as possible.  One of the nurses will contact you 24 hours after your treatment. Please let the nurse know about any problems that you may have experienced. Feel free to call the clinic you have any questions or concerns. The clinic phone number is [phone].   I have been informed and understand all the instructions given to me. I know to contact the clinic, my physician, or go to the Emergency Department if any problems should occur. I do not have any questions at this time, but understand that I may call the clinic during office hours   should I have any questions or need assistance in obtaining follow up care.    __________________________________________  _____________  __________ Signature of Patient or Authorized Representative            Date                   Time    __________________________________________ Nurse's Signature    

## 2012-03-25 ENCOUNTER — Other Ambulatory Visit: Payer: Self-pay | Admitting: *Deleted

## 2012-03-29 ENCOUNTER — Telehealth: Payer: Self-pay | Admitting: *Deleted

## 2012-03-29 NOTE — Telephone Encounter (Signed)
Per patient request I have combined her treatments from 2/11 and 2/12 to the same day.   JMW

## 2012-03-30 ENCOUNTER — Other Ambulatory Visit (HOSPITAL_BASED_OUTPATIENT_CLINIC_OR_DEPARTMENT_OTHER): Admitting: Lab

## 2012-03-30 ENCOUNTER — Ambulatory Visit (HOSPITAL_BASED_OUTPATIENT_CLINIC_OR_DEPARTMENT_OTHER)

## 2012-03-30 VITALS — BP 95/50 | HR 73 | Temp 97.7°F | Resp 20

## 2012-03-30 DIAGNOSIS — C9110 Chronic lymphocytic leukemia of B-cell type not having achieved remission: Secondary | ICD-10-CM

## 2012-03-30 DIAGNOSIS — Z5111 Encounter for antineoplastic chemotherapy: Secondary | ICD-10-CM

## 2012-03-30 LAB — CBC WITH DIFFERENTIAL/PLATELET
Basophils Absolute: 0.1 10*3/uL (ref 0.0–0.1)
Eosinophils Absolute: 0.2 10*3/uL (ref 0.0–0.5)
HCT: 42.5 % (ref 34.8–46.6)
HGB: 14 g/dL (ref 11.6–15.9)
MCH: 30.3 pg (ref 25.1–34.0)
NEUT#: 1.7 10*3/uL (ref 1.5–6.5)
NEUT%: 18.8 % — ABNORMAL LOW (ref 38.4–76.8)
RDW: 12.7 % (ref 11.2–14.5)
lymph#: 6.2 10*3/uL — ABNORMAL HIGH (ref 0.9–3.3)

## 2012-03-30 MED ORDER — SODIUM CHLORIDE 0.9 % IV SOLN
0.1500 mg/kg | Freq: Once | INTRAVENOUS | Status: AC
  Administered 2012-03-30: 8 mg via INTRAVENOUS
  Filled 2012-03-30: qty 8

## 2012-03-30 MED ORDER — SODIUM CHLORIDE 0.9 % IJ SOLN
10.0000 mL | INTRAMUSCULAR | Status: DC | PRN
  Administered 2012-03-30: 10 mL
  Filled 2012-03-30: qty 10

## 2012-03-30 MED ORDER — LORAZEPAM 2 MG/ML IJ SOLN
0.5000 mg | Freq: Once | INTRAMUSCULAR | Status: AC
  Administered 2012-03-30: 0.5 mg via INTRAVENOUS

## 2012-03-30 MED ORDER — SODIUM CHLORIDE 0.9 % IV SOLN
Freq: Once | INTRAVENOUS | Status: AC
  Administered 2012-03-30: 09:00:00 via INTRAVENOUS

## 2012-03-30 MED ORDER — PROCHLORPERAZINE EDISYLATE 5 MG/ML IJ SOLN
10.0000 mg | Freq: Once | INTRAMUSCULAR | Status: AC
  Administered 2012-03-30: 10 mg via INTRAVENOUS

## 2012-03-30 MED ORDER — HEPARIN SOD (PORK) LOCK FLUSH 100 UNIT/ML IV SOLN
500.0000 [IU] | Freq: Once | INTRAVENOUS | Status: AC | PRN
  Administered 2012-03-30: 500 [IU]
  Filled 2012-03-30: qty 5

## 2012-03-30 NOTE — Patient Instructions (Addendum)
[facility] Discharge Instructions for Patients Receiving Chemotherapy  Today you received the following chemotherapy agents: Cladribine. To help prevent nausea and vomiting after your treatment, we encourage you to take your nausea medication.    If you develop nausea and vomiting that is not controlled by your nausea medication, call the clinic. If it is after clinic hours your family physician or the after hours number for the clinic or go to the Emergency Department.   BELOW ARE SYMPTOMS THAT SHOULD BE REPORTED IMMEDIATELY:  *FEVER GREATER THAN 100.5 F  *CHILLS WITH OR WITHOUT FEVER  NAUSEA AND VOMITING THAT IS NOT CONTROLLED WITH YOUR NAUSEA MEDICATION  *UNUSUAL SHORTNESS OF BREATH  *UNUSUAL BRUISING OR BLEEDING  TENDERNESS IN MOUTH AND THROAT WITH OR WITHOUT PRESENCE OF ULCERS  *URINARY PROBLEMS  *BOWEL PROBLEMS  UNUSUAL RASH Items with * indicate a potential emergency and should be followed up as soon as possible.  Anxiety and Panic Attacks Anxiety is your body's way of reacting to real danger or something you think is a danger. It may be fear or worry over a situation like losing your job. Sometimes the cause is not known. A panic attack is made up of physical signs like sweating, shaking, or chest pain. Anxiety and panic attacks may start suddenly. They may be strong. They may come at any time of day, even while sleeping. They may come at any time of life. Panic attacks are scary, but they do not harm you physically.  HOME CARE  Avoid any known causes of your anxiety.  Try to relax. Yoga may help. Tell yourself everything will be okay.  Exercise often.  Get expert advice and help (therapy) to stop anxiety or attacks from happening.  Avoid caffeine, alcohol, and drugs.  Only take medicine as told by your doctor. GET HELP RIGHT AWAY IF:  Your attacks seem different than normal attacks.  Your problems are getting worse or concern you. MAKE SURE  YOU:  Understand these instructions.  Will watch your condition.  Will get help right away if you are not doing well or get worse. Document Released: 03/15/2010 Document Revised: 05/05/2011 Document Reviewed: 03/15/2010 [facility] Patient Information 2013 [place], [place].

## 2012-03-30 NOTE — Progress Notes (Signed)
Pt requests medication to help her relax.  Dr. Darnelle Catalan notified. Orders received.

## 2012-04-06 ENCOUNTER — Other Ambulatory Visit: Payer: Self-pay | Admitting: *Deleted

## 2012-04-06 ENCOUNTER — Other Ambulatory Visit: Admitting: Lab

## 2012-04-06 ENCOUNTER — Ambulatory Visit (HOSPITAL_BASED_OUTPATIENT_CLINIC_OR_DEPARTMENT_OTHER)

## 2012-04-06 ENCOUNTER — Other Ambulatory Visit (HOSPITAL_BASED_OUTPATIENT_CLINIC_OR_DEPARTMENT_OTHER): Admitting: Lab

## 2012-04-06 VITALS — BP 105/52 | HR 73 | Temp 98.5°F

## 2012-04-06 DIAGNOSIS — Z5112 Encounter for antineoplastic immunotherapy: Secondary | ICD-10-CM

## 2012-04-06 DIAGNOSIS — C9110 Chronic lymphocytic leukemia of B-cell type not having achieved remission: Secondary | ICD-10-CM

## 2012-04-06 LAB — CBC WITH DIFFERENTIAL/PLATELET
Basophils Absolute: 0.1 10*3/uL (ref 0.0–0.1)
Eosinophils Absolute: 0.1 10*3/uL (ref 0.0–0.5)
HGB: 13.9 g/dL (ref 11.6–15.9)
MCV: 90.9 fL (ref 79.5–101.0)
MONO#: 0.8 10*3/uL (ref 0.1–0.9)
MONO%: 10.7 % (ref 0.0–14.0)
NEUT#: 1.8 10*3/uL (ref 1.5–6.5)
RDW: 12.7 % (ref 11.2–14.5)

## 2012-04-06 LAB — TECHNOLOGIST REVIEW

## 2012-04-06 MED ORDER — SODIUM CHLORIDE 0.9 % IV SOLN
2000.0000 mg | Freq: Once | INTRAVENOUS | Status: AC
  Administered 2012-04-06: 2000 mg via INTRAVENOUS
  Filled 2012-04-06: qty 100

## 2012-04-06 MED ORDER — SODIUM CHLORIDE 0.9 % IV SOLN: Freq: Once | INTRAVENOUS | Status: DC

## 2012-04-06 MED ORDER — SODIUM CHLORIDE 0.9 % IV SOLN
0.1500 mg/kg | Freq: Once | INTRAVENOUS | Status: AC
  Administered 2012-04-06: 8 mg via INTRAVENOUS
  Filled 2012-04-06: qty 8

## 2012-04-06 MED ORDER — METHYLPREDNISOLONE SODIUM SUCC 125 MG IJ SOLR
125.0000 mg | Freq: Once | INTRAMUSCULAR | Status: AC
  Administered 2012-04-06: 125 mg via INTRAVENOUS

## 2012-04-06 MED ORDER — PROCHLORPERAZINE EDISYLATE 5 MG/ML IJ SOLN
10.0000 mg | Freq: Once | INTRAMUSCULAR | Status: AC
  Administered 2012-04-06: 10 mg via INTRAVENOUS

## 2012-04-06 MED ORDER — SODIUM CHLORIDE 0.9 % IV SOLN
Freq: Once | INTRAVENOUS | Status: AC
  Administered 2012-04-06: 09:00:00 via INTRAVENOUS

## 2012-04-06 MED ORDER — DIPHENHYDRAMINE HCL 25 MG PO CAPS
50.0000 mg | ORAL_CAPSULE | Freq: Once | ORAL | Status: AC
  Administered 2012-04-06: 50 mg via ORAL

## 2012-04-06 MED ORDER — HEPARIN SOD (PORK) LOCK FLUSH 100 UNIT/ML IV SOLN
500.0000 [IU] | Freq: Once | INTRAVENOUS | Status: AC | PRN
  Filled 2012-04-06: qty 5

## 2012-04-06 MED ORDER — ACETAMINOPHEN 500 MG PO TABS
1000.0000 mg | ORAL_TABLET | Freq: Once | ORAL | Status: AC
  Administered 2012-04-06: 1000 mg via ORAL

## 2012-04-06 MED ORDER — SODIUM CHLORIDE 0.9 % IJ SOLN
10.0000 mL | INTRAMUSCULAR | Status: DC | PRN
  Filled 2012-04-06: qty 10

## 2012-04-06 NOTE — Progress Notes (Signed)
Arzerra rate remains at 400 ml's/hr for 400 ml's.  Pt without complaints.

## 2012-04-06 NOTE — Progress Notes (Signed)
Arzerra started at 25 ml/hr for 12.5 ml's.

## 2012-04-06 NOTE — Progress Notes (Signed)
Arzerra increased to 400 ml's/hr for 200 ml's.  Pt without complaints

## 2012-04-06 NOTE — Progress Notes (Signed)
Arzerra rate increased to 277ml's/hr for 100 ml's/  Pt without complaints.

## 2012-04-06 NOTE — Progress Notes (Signed)
Arzerra rate increased to 100 ml's/hr for 50 ml's.  Pt without complaints.

## 2012-04-06 NOTE — Patient Instructions (Addendum)
[facility] Discharge Instructions for Patients Receiving Chemotherapy  Today you received the following chemotherapy agents Arzerra and Leustatin To help prevent nausea and vomiting after your treatment, we encourage you to take your nausea medication as prescribed  Begin taking it as directed and take it as often as prescribed for the next 48-72 hours.   If you develop nausea and vomiting that is not controlled by your nausea medication, call the clinic. If it is after clinic hours your family physician or the after hours number for the clinic or go to the Emergency Department.   BELOW ARE SYMPTOMS THAT SHOULD BE REPORTED IMMEDIATELY:  *FEVER GREATER THAN 100.5 F  *CHILLS WITH OR WITHOUT FEVER  NAUSEA AND VOMITING THAT IS NOT CONTROLLED WITH YOUR NAUSEA MEDICATION  *UNUSUAL SHORTNESS OF BREATH  *UNUSUAL BRUISING OR BLEEDING  TENDERNESS IN MOUTH AND THROAT WITH OR WITHOUT PRESENCE OF ULCERS  *URINARY PROBLEMS  *BOWEL PROBLEMS  UNUSUAL RASH Items with * indicate a potential emergency and should be followed up as soon as possible.  One of the nurses will contact you 24 hours after your treatment. Please let the nurse know about any problems that you may have experienced. Feel free to call the clinic you have any questions or concerns. The clinic phone number is [phone].   I have been informed and understand all the instructions given to me. I know to contact the clinic, my physician, or go to the Emergency Department if any problems should occur. I do not have any questions at this time, but understand that I may call the clinic during office hours   should I have any questions or need assistance in obtaining follow up care.    __________________________________________  _____________  __________ Signature of Patient or Authorized Representative            Date                   Time    __________________________________________ Nurse's  Signature

## 2012-04-06 NOTE — Progress Notes (Signed)
Arzerra rate increased to 50 ml/hr for 25 ml's.  Pt without complaints.

## 2012-04-07 ENCOUNTER — Ambulatory Visit

## 2012-04-07 ENCOUNTER — Other Ambulatory Visit: Payer: Self-pay | Admitting: Certified Registered Nurse Anesthetist

## 2012-04-12 ENCOUNTER — Other Ambulatory Visit

## 2012-04-13 ENCOUNTER — Ambulatory Visit (HOSPITAL_BASED_OUTPATIENT_CLINIC_OR_DEPARTMENT_OTHER): Admitting: Oncology

## 2012-04-13 ENCOUNTER — Telehealth: Payer: Self-pay | Admitting: Oncology

## 2012-04-13 ENCOUNTER — Other Ambulatory Visit (HOSPITAL_BASED_OUTPATIENT_CLINIC_OR_DEPARTMENT_OTHER): Admitting: Lab

## 2012-04-13 VITALS — BP 114/77 | HR 81 | Temp 98.3°F | Resp 20 | Ht 58.5 in | Wt 117.9 lb

## 2012-04-13 DIAGNOSIS — F341 Dysthymic disorder: Secondary | ICD-10-CM

## 2012-04-13 DIAGNOSIS — C9110 Chronic lymphocytic leukemia of B-cell type not having achieved remission: Secondary | ICD-10-CM

## 2012-04-13 LAB — CBC WITH DIFFERENTIAL/PLATELET
BASO%: 0.4 % (ref 0.0–2.0)
Basophils Absolute: 0 10*3/uL (ref 0.0–0.1)
EOS%: 1.7 % (ref 0.0–7.0)
HCT: 42.4 % (ref 34.8–46.6)
HGB: 14.2 g/dL (ref 11.6–15.9)
MCH: 30.2 pg (ref 25.1–34.0)
MCHC: 33.5 g/dL (ref 31.5–36.0)
MONO#: 0.7 10*3/uL (ref 0.1–0.9)
NEUT%: 41.8 % (ref 38.4–76.8)
RDW: 12.4 % (ref 11.2–14.5)
WBC: 9 10*3/uL (ref 3.9–10.3)
lymph#: 4.4 10*3/uL — ABNORMAL HIGH (ref 0.9–3.3)

## 2012-04-13 NOTE — Progress Notes (Signed)
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 She has completed 6 cycles of cladribine and of course continues on monthly ofatumumab. The family situation is as before.  REVIEW OF SYSTEMS: She has lost a little more weight but she is not exercising regularly. She's not sleeping well. She has some problems with blurred vision and sinus issues, and today her head feels stuffy. She has started herself on Claritin. She feels anxious and depressed. She is tolerating the chemotherapy well however with no unusual side effects. A detailed review of systems today was otherwise noncontributory  PAST MEDICAL HISTORY: Past Medical History  Diagnosis Date  . Cancer   . Leukemia   . Lower back pain     PAST SURGICAL HISTORY: Past Surgical History  Procedure Laterality Date  . Tonsillectomy    . Other surgical history      removal of uterine lining    FAMILY HISTORY No family history on file.  GYNECOLOGIC HISTORY:  SOCIAL HISTORY: Her husband [name] is retired from [employer]. Her son [name] lives in [place] and has 3 sons; he works for a [employer], environmental. A second son lives in [place] and has two children. Son [name] lives in [place] and has a daughter, as well as 2 sons by marriage.   ADVANCED DIRECTIVES:  HEALTH MAINTENANCE: History  Substance Use Topics  . Smoking status: Never Smoker   . Smokeless tobacco: Not on file  . Alcohol Use: Yes     Colonoscopy: due  PAP: December 2012/ [name]  Bone density: Due  Lipid panel: per Dr Zachery Dauer  Allergies  Allergen Reactions  . Penicillins     "drops my heartbeat" "I just pass out"    Current Outpatient Prescriptions  Medication Sig Dispense Refill  . acyclovir (ZOVIRAX) 400 MG tablet Take 400 mg by mouth 2 (two) times daily.      Marland Kitchen allopurinol (ZYLOPRIM) 300 MG tablet Take 300 mg by  mouth daily.      Marland Kitchen aspirin 81 MG tablet Take 81 mg by mouth daily.      . Calcium Citrate (CITRACAL PO) Take 4 tablets by mouth daily.       . Cholecalciferol (VITAMIN D-3 PO) Take 4,000 Int'l Units by mouth.      . Estradiol (ELESTRIN) 0.52 MG/0.87 GM (0.06%) GEL Apply 1 application topically at bedtime.       . lidocaine-prilocaine (EMLA) cream Apply topically as needed. APPLY NICKEL SIZE AMOUNT TO SKIN ON TOP OF PORT- COVER WITH SARAN WRAP AT LEAST 1 HOUR BEFORE ACCESS  30 g  1  . methylPREDNISolone (MEDROL DOSEPAK) 4 MG tablet follow package directions  21 tablet  0  . ondansetron (ZOFRAN) 8 MG tablet Take 1 tablet (8 mg total) by mouth every 12 (twelve) hours as needed for nausea.  20 tablet  1  . Progesterone Micronized (PROMETRIUM PO) Take 200 mg by mouth. 1st - 12th day of every other month      . sulfamethoxazole-trimethoprim (BACTRIM DS,SEPTRA DS) 800-160 MG per tablet Take 1 tablet by mouth 3 (three) times a week. Take Monday, Wednesday and Friday      . traZODone (DESYREL) 50 MG tablet Take 50 mg by mouth at bedtime.       No current facility-administered medications for this visit.   Facility-Administered Medications Ordered in Other Visits  Medication  Dose Route Frequency Provider Last Rate Last Dose  . 0.9 %  sodium chloride infusion   Intravenous Once Lowella Dell, MD      . sodium chloride 0.9 % injection 10 mL  10 mL Intracatheter PRN Lowella Dell, MD   10 mL at 03/23/12 1234  . sodium chloride 0.9 % injection 10 mL  10 mL Intracatheter PRN Lowella Dell, MD        OBJECTIVE: Middle-aged white woman in no acute distress Filed Vitals:   04/13/12 1537  BP: 114/77  Pulse: 81  Temp: 98.3 F (36.8 C)  Resp: 20     Body mass index is 24.22 kg/(m^2).    ECOG FS: 1  Sclerae unicteric Oropharynx clear Lungs no rales or rhonchi Heart regular rate and rhythm Abd benign, no splenomegaly MSK no focal spinal tenderness, no peripheral edema Neuro: nonfocal;  well oriented; positive affect Breast exam: deferred   LAB RESULTS: Lab Results  Component Value Date   WBC 9.0 04/13/2012   NEUTROABS 3.8 04/13/2012   HGB 14.2 04/13/2012   HCT 42.4 04/13/2012   MCV 90.2 04/13/2012   PLT 140* 04/13/2012      Chemistry      Component Value Date/Time   NA 139 02/23/2012 1249   NA 138 12/15/2011 1322   K 4.2 02/23/2012 1249   K 3.7 12/15/2011 1322   CL 104 02/23/2012 1249   CL 101 12/15/2011 1322   CO2 27 02/23/2012 1249   CO2 26 12/15/2011 1322   BUN 18 02/23/2012 1249   BUN 14.0 12/15/2011 1322   CREATININE 0.71 02/23/2012 1249   CREATININE 1.1 12/15/2011 1322      Component Value Date/Time   CALCIUM 8.9 02/23/2012 1249   CALCIUM 9.9 12/15/2011 1322   ALKPHOS 78 01/14/2012 2153   ALKPHOS 104 12/15/2011 1322   AST 54* 01/14/2012 2153   AST 22 12/15/2011 1322   ALT 43* 01/14/2012 2153   ALT 21 12/15/2011 1322   BILITOT 0.2* 01/14/2012 2153   BILITOT 0.30 12/15/2011 1322       No results found for this basename: LABCA2    No components found with this basename: LABCA125    No results found for this basename: INR,  in the last 168 hours  Urinalysis No results found for this basename: colorurine,  appearanceur,  labspec,  phurine,  glucoseu,  hgbur,  bilirubinur,  ketonesur,  proteinur,  urobilinogen,  nitrite,  leukocytesur    STUDIES: Ir Fluoro Guide Cv Line Right  02/23/2012  *RADIOLOGY REPORT*  Clinical Data: History of leukemia.  Port-A-Cath required for further chemotherapy needs.  IMPLANTED PORT A CATH PLACEMENT WITH ULTRASOUND AND FLUOROSCOPIC GUIDANCE  Sedation:  2.0 mg IV Versed; 100 mcg IV Fentanyl.  Total Moderate Sedation Time:  40 minutes.  Additional Medications:  1 gram IV vancomycin.  Vancomycin was given within two hours of incision.  Vancomycin was given due to an antibiotic allergy.  Fluoroscopy Time:  0.3 minutes.  Procedure:  The procedure, risks, benefits, and alternatives were explained to the patient.   Questions regarding the procedure were encouraged and answered.  The patient understands and consents to the procedure.  The right neck and chest were prepped with chlorhexidine in a sterile fashion, and a sterile drape was applied covering the operative field.  Maximum barrier sterile technique with sterile gowns and gloves were used for the procedure.  Local anesthesia was provided with 1% lidocaine and lidocaine with epinephrine.  After  creating a small venotomy incision, a 21 gauge needle was advanced into the right internal jugular vein under direct, real- time ultrasound guidance.  Ultrasound image documentation was performed.  After securing guidewire access, an 8 Fr dilator was placed.  A J-wire was kinked to measure appropriate catheter length.  A subcutaneous port pocket was then created along the upper chest wall utilizing sharp and blunt dissection.  Portable cautery was utilized.  The pocket was irrigated with sterile saline.  A single lumen power injectable port was chosen for placement.  The 8 Fr catheter was tunneled from the port pocket site to the venotomy incision.  The port was placed in the pocket and secured with two Ethilon tacking sutures.  External catheter was trimmed to appropriate length based on guidewire measurement.  At the venotomy, an 8 Fr peel-away sheath was placed over a guidewire.  The catheter was then placed through the sheath and the sheath removed.  Final catheter positioning was confirmed and documented with a fluoroscopic spot image.  The port was accessed with a needle and aspirated and flushed with heparinized saline. The needle was removed.  The venotomy and port pocket incisions were closed with subcutaneous 3-0 Monocryl and subcuticular 4-0 Vicryl.  Dermabond was applied to both incisions.  Complications: None.  No pneumothorax.  Findings:  After catheter placement, the tip lies at the cavoatrial junction.  The catheter aspirates normally and is ready for immediate  use.  IMPRESSION:  Placement of single lumen port a cath via right internal jugular vein.  The catheter tip lies at the cavoatrial junction.  A power injectable port a cath was placed and is ready for immediate use.   Original Report Authenticated By: Irish Lack, M.D.    Ir US Guide Vasc Access Right  02/23/2012  *RADIOLOGY REPORT*  Clinical Data: History of leukemia.  Port-A-Cath required for further chemotherapy needs.  IMPLANTED PORT A CATH PLACEMENT WITH ULTRASOUND AND FLUOROSCOPIC GUIDANCE  Sedation:  2.0 mg IV Versed; 100 mcg IV Fentanyl.  Total Moderate Sedation Time:  40 minutes.  Additional Medications:  1 gram IV vancomycin.  Vancomycin was given within two hours of incision.  Vancomycin was given due to an antibiotic allergy.  Fluoroscopy Time:  0.3 minutes.  Procedure:  The procedure, risks, benefits, and alternatives were explained to the patient.  Questions regarding the procedure were encouraged and answered.  The patient understands and consents to the procedure.  The right neck and chest were prepped with chlorhexidine in a sterile fashion, and a sterile drape was applied covering the operative field.  Maximum barrier sterile technique with sterile gowns and gloves were used for the procedure.  Local anesthesia was provided with 1% lidocaine and lidocaine with epinephrine.  After creating a small venotomy incision, a 21 gauge needle was advanced into the right internal jugular vein under direct, real- time ultrasound guidance.  Ultrasound image documentation was performed.  After securing guidewire access, an 8 Fr dilator was placed.  A J-wire was kinked to measure appropriate catheter length.  A subcutaneous port pocket was then created along the upper chest wall utilizing sharp and blunt dissection.  Portable cautery was utilized.  The pocket was irrigated with sterile saline.  A single lumen power injectable port was chosen for placement.  The 8 Fr catheter was tunneled from the port  pocket site to the venotomy incision.  The port was placed in the pocket and secured with two Ethilon tacking sutures.  External catheter was trimmed  to appropriate length based on guidewire measurement.  At the venotomy, an 8 Fr peel-away sheath was placed over a guidewire.  The catheter was then placed through the sheath and the sheath removed.  Final catheter positioning was confirmed and documented with a fluoroscopic spot image.  The port was accessed with a needle and aspirated and flushed with heparinized saline. The needle was removed.  The venotomy and port pocket incisions were closed with subcutaneous 3-0 Monocryl and subcuticular 4-0 Vicryl.  Dermabond was applied to both incisions.  Complications: None.  No pneumothorax.  Findings:  After catheter placement, the tip lies at the cavoatrial junction.  The catheter aspirates normally and is ready for immediate use.  IMPRESSION:  Placement of single lumen port a cath via right internal jugular vein.  The catheter tip lies at the cavoatrial junction.  A power injectable port a cath was placed and is ready for immediate use.   Original Report Authenticated By: Irish Lack, M.D.     ASSESSMENT: 62 y.o.  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 This 1 Rose Morse deeply and did not decrease down to normal levels as before. At this point though we are stopping the cladribine. She is going to continue the ofatumumab for an additional 3 cycles. She's got to see me again in may, with the final  cycle, and at that point we will likely initiate followup. She will need to have her port flushed every 6 weeks as she intends to keep it. Note that she is planning to travel the early March and so her next treatment we'll not be until March 18. She knows to call for any problems that may develop before that visit.  :Lowella Dell    04/13/2012

## 2012-04-13 NOTE — Telephone Encounter (Signed)
gv pt appt schedule for March thru May. °

## 2012-04-20 ENCOUNTER — Encounter: Payer: Self-pay | Admitting: *Deleted

## 2012-05-10 ENCOUNTER — Other Ambulatory Visit: Payer: Self-pay | Admitting: *Deleted

## 2012-05-11 ENCOUNTER — Other Ambulatory Visit (HOSPITAL_BASED_OUTPATIENT_CLINIC_OR_DEPARTMENT_OTHER): Admitting: Lab

## 2012-05-11 ENCOUNTER — Ambulatory Visit (HOSPITAL_BASED_OUTPATIENT_CLINIC_OR_DEPARTMENT_OTHER)

## 2012-05-11 VITALS — BP 107/68 | HR 90 | Temp 97.0°F | Resp 18

## 2012-05-11 DIAGNOSIS — Z5112 Encounter for antineoplastic immunotherapy: Secondary | ICD-10-CM

## 2012-05-11 DIAGNOSIS — C9110 Chronic lymphocytic leukemia of B-cell type not having achieved remission: Secondary | ICD-10-CM

## 2012-05-11 LAB — CBC WITH DIFFERENTIAL/PLATELET
Eosinophils Absolute: 0.3 10*3/uL (ref 0.0–0.5)
MCV: 89.5 fL (ref 79.5–101.0)
MONO#: 0.6 10*3/uL (ref 0.1–0.9)
MONO%: 7.4 % (ref 0.0–14.0)
NEUT#: 2.9 10*3/uL (ref 1.5–6.5)
RBC: 4.68 10*6/uL (ref 3.70–5.45)
RDW: 12.1 % (ref 11.2–14.5)
WBC: 7.7 10*3/uL (ref 3.9–10.3)
nRBC: 0 % (ref 0–0)

## 2012-05-11 MED ORDER — SODIUM CHLORIDE 0.9 % IJ SOLN
10.0000 mL | INTRAMUSCULAR | Status: DC | PRN
  Administered 2012-05-11: 10 mL
  Filled 2012-05-11: qty 10

## 2012-05-11 MED ORDER — METHYLPREDNISOLONE SODIUM SUCC 125 MG IJ SOLR
125.0000 mg | Freq: Once | INTRAMUSCULAR | Status: AC
  Administered 2012-05-11: 125 mg via INTRAVENOUS

## 2012-05-11 MED ORDER — ACETAMINOPHEN 500 MG PO TABS
1000.0000 mg | ORAL_TABLET | Freq: Once | ORAL | Status: AC
  Administered 2012-05-11: 1000 mg via ORAL

## 2012-05-11 MED ORDER — DIPHENHYDRAMINE HCL 25 MG PO CAPS
50.0000 mg | ORAL_CAPSULE | Freq: Once | ORAL | Status: AC
  Administered 2012-05-11: 50 mg via ORAL

## 2012-05-11 MED ORDER — SODIUM CHLORIDE 0.9 % IV SOLN
Freq: Once | INTRAVENOUS | Status: AC
  Administered 2012-05-11: 09:00:00 via INTRAVENOUS

## 2012-05-11 MED ORDER — SODIUM CHLORIDE 0.9 % IV SOLN
2000.0000 mg | Freq: Once | INTRAVENOUS | Status: AC
  Administered 2012-05-11: 2000 mg via INTRAVENOUS
  Filled 2012-05-11: qty 100

## 2012-05-11 MED ORDER — HEPARIN SOD (PORK) LOCK FLUSH 100 UNIT/ML IV SOLN
500.0000 [IU] | Freq: Once | INTRAVENOUS | Status: AC | PRN
  Administered 2012-05-11: 500 [IU]
  Filled 2012-05-11: qty 5

## 2012-05-11 NOTE — Progress Notes (Signed)
Patient states her left eye is hurting. Left eyelid is noted to have swelling and slight redness. Patient states she talked to Mena Pauls, RN yesterday in regards to this issue as it has been bothering her since Saturday. Per the patient Mena Pauls, RN instructed her to use warm compresses and follow up with an opthamologist. Patient states she has an appointment with Dr. Lavona Mound on Thursday, but wanted to know if Dr. Darrall Dears RN could get her a sooner appointment. Spoke to [employer], RN stated she would try to get the patient seen sooner. Patient has now told this RN her left eye pain is no longer at the outer canthus but below the eye. Swelling is noted below the left eye. Per Mena Pauls, RN if the patient is unable to see her opthamologist today then Dr. Darnelle Catalan will look at it. Patient has appointment with Dr. Lavona Mound today when she leaves here. Mena Pauls, RN notified of patient's appointment with Dr. Lavona Mound.  Patient states her husband is picking her up today.

## 2012-05-11 NOTE — Patient Instructions (Signed)
[facility] Discharge Instructions for Patients Receiving Chemotherapy  Today you received the following chemotherapy agents Azerra To help prevent nausea and vomiting after your treatment, we encourage you to take your nausea medication as prescribed.  If you develop nausea and vomiting that is not controlled by your nausea medication, call the clinic. If it is after clinic hours your family physician or the after hours number for the clinic or go to the Emergency Department.   BELOW ARE SYMPTOMS THAT SHOULD BE REPORTED IMMEDIATELY:  *FEVER GREATER THAN 100.5 F  *CHILLS WITH OR WITHOUT FEVER  NAUSEA AND VOMITING THAT IS NOT CONTROLLED WITH YOUR NAUSEA MEDICATION  *UNUSUAL SHORTNESS OF BREATH  *UNUSUAL BRUISING OR BLEEDING  TENDERNESS IN MOUTH AND THROAT WITH OR WITHOUT PRESENCE OF ULCERS  *URINARY PROBLEMS  *BOWEL PROBLEMS  UNUSUAL RASH Items with * indicate a potential emergency and should be followed up as soon as possible.  One of the nurses will contact you 24 hours after your treatment. Please let the nurse know about any problems that you may have experienced. Feel free to call the clinic you have any questions or concerns. The clinic phone number is [phone].   I have been informed and understand all the instructions given to me. I know to contact the clinic, my physician, or go to the Emergency Department if any problems should occur. I do not have any questions at this time, but understand that I may call the clinic during office hours   should I have any questions or need assistance in obtaining follow up care.    __________________________________________  _____________  __________ Signature of Patient or Authorized Representative            Date                   Time    __________________________________________ Nurse's Signature    

## 2012-05-13 ENCOUNTER — Other Ambulatory Visit: Payer: Self-pay | Admitting: *Deleted

## 2012-05-13 DIAGNOSIS — C9110 Chronic lymphocytic leukemia of B-cell type not having achieved remission: Secondary | ICD-10-CM

## 2012-05-13 MED ORDER — PROCHLORPERAZINE MALEATE 10 MG PO TABS: 10.0000 mg | ORAL_TABLET | Freq: Four times a day (QID) | ORAL | Status: DC | PRN

## 2012-05-13 NOTE — Telephone Encounter (Signed)
Received call from patient stating she has had one episode of vomiting today and is feeling fatigued.  Patient states she feels fine now but would like to have something in case this happens again.  Per Zollie Scale, PA call in Compazine for her.  Patient aware.  Informed patient to call if any more problems. She verbalized understanding.

## 2012-06-08 ENCOUNTER — Other Ambulatory Visit (HOSPITAL_BASED_OUTPATIENT_CLINIC_OR_DEPARTMENT_OTHER): Admitting: Lab

## 2012-06-08 ENCOUNTER — Ambulatory Visit (HOSPITAL_BASED_OUTPATIENT_CLINIC_OR_DEPARTMENT_OTHER)

## 2012-06-08 VITALS — BP 92/70 | HR 68 | Temp 97.0°F | Resp 18

## 2012-06-08 DIAGNOSIS — C9110 Chronic lymphocytic leukemia of B-cell type not having achieved remission: Secondary | ICD-10-CM

## 2012-06-08 DIAGNOSIS — Z5112 Encounter for antineoplastic immunotherapy: Secondary | ICD-10-CM

## 2012-06-08 LAB — CBC WITH DIFFERENTIAL/PLATELET
BASO%: 1.4 % (ref 0.0–2.0)
HCT: 40.9 % (ref 34.8–46.6)
MCHC: 33.5 g/dL (ref 31.5–36.0)
MONO#: 0.7 10*3/uL (ref 0.1–0.9)
NEUT%: 34.6 % — ABNORMAL LOW (ref 38.4–76.8)
RDW: 12.3 % (ref 11.2–14.5)
WBC: 6.3 10*3/uL (ref 3.9–10.3)
lymph#: 3.2 10*3/uL (ref 0.9–3.3)
nRBC: 0 % (ref 0–0)

## 2012-06-08 MED ORDER — SODIUM CHLORIDE 0.9 % IV SOLN
Freq: Once | INTRAVENOUS | Status: AC
  Administered 2012-06-08: 09:00:00 via INTRAVENOUS

## 2012-06-08 MED ORDER — SODIUM CHLORIDE 0.9 % IJ SOLN
10.0000 mL | INTRAMUSCULAR | Status: DC | PRN
  Administered 2012-06-08: 10 mL
  Filled 2012-06-08: qty 10

## 2012-06-08 MED ORDER — SODIUM CHLORIDE 0.9 % IV SOLN
2000.0000 mg | Freq: Once | INTRAVENOUS | Status: AC
  Administered 2012-06-08: 2000 mg via INTRAVENOUS
  Filled 2012-06-08: qty 100

## 2012-06-08 MED ORDER — HEPARIN SOD (PORK) LOCK FLUSH 100 UNIT/ML IV SOLN
500.0000 [IU] | Freq: Once | INTRAVENOUS | Status: AC | PRN
  Administered 2012-06-08: 500 [IU]
  Filled 2012-06-08: qty 5

## 2012-06-08 MED ORDER — METHYLPREDNISOLONE SODIUM SUCC 125 MG IJ SOLR
125.0000 mg | Freq: Once | INTRAMUSCULAR | Status: AC
  Administered 2012-06-08: 125 mg via INTRAVENOUS

## 2012-06-08 MED ORDER — ACETAMINOPHEN 500 MG PO TABS
1000.0000 mg | ORAL_TABLET | Freq: Once | ORAL | Status: AC
  Administered 2012-06-08: 1000 mg via ORAL

## 2012-06-08 MED ORDER — DIPHENHYDRAMINE HCL 25 MG PO CAPS
50.0000 mg | ORAL_CAPSULE | Freq: Once | ORAL | Status: AC
  Administered 2012-06-08: 50 mg via ORAL

## 2012-06-08 NOTE — Patient Instructions (Signed)
[facility] Discharge Instructions for Patients Receiving Chemotherapy  Today you received the following chemotherapy agents Azerra To help prevent nausea and vomiting after your treatment, we encourage you to take your nausea medication as prescribed.  If you develop nausea and vomiting that is not controlled by your nausea medication, call the clinic. If it is after clinic hours your family physician or the after hours number for the clinic or go to the Emergency Department.   BELOW ARE SYMPTOMS THAT SHOULD BE REPORTED IMMEDIATELY:  *FEVER GREATER THAN 100.5 F  *CHILLS WITH OR WITHOUT FEVER  NAUSEA AND VOMITING THAT IS NOT CONTROLLED WITH YOUR NAUSEA MEDICATION  *UNUSUAL SHORTNESS OF BREATH  *UNUSUAL BRUISING OR BLEEDING  TENDERNESS IN MOUTH AND THROAT WITH OR WITHOUT PRESENCE OF ULCERS  *URINARY PROBLEMS  *BOWEL PROBLEMS  UNUSUAL RASH Items with * indicate a potential emergency and should be followed up as soon as possible.  One of the nurses will contact you 24 hours after your treatment. Please let the nurse know about any problems that you may have experienced. Feel free to call the clinic you have any questions or concerns. The clinic phone number is [phone].   I have been informed and understand all the instructions given to me. I know to contact the clinic, my physician, or go to the Emergency Department if any problems should occur. I do not have any questions at this time, but understand that I may call the clinic during office hours   should I have any questions or need assistance in obtaining follow up care.    __________________________________________  _____________  __________ Signature of Patient or Authorized Representative            Date                   Time    __________________________________________ Nurse's Signature

## 2012-06-18 NOTE — Telephone Encounter (Signed)
No entry 

## 2012-06-18 NOTE — Telephone Encounter (Signed)
duplicate

## 2012-06-23 ENCOUNTER — Telehealth: Payer: Self-pay | Admitting: Emergency Medicine

## 2012-06-23 NOTE — Telephone Encounter (Signed)
Patient called inquiring if she could move her treatment to 5/8 or 5/9 or 6/2 or 6/3. Patient states she is planning a trip to [place] to leave 5/10 and return 6/1.  Will review with Dr Darnelle Catalan and notify patient of further instructions.

## 2012-06-24 ENCOUNTER — Other Ambulatory Visit: Payer: Self-pay | Admitting: *Deleted

## 2012-06-24 NOTE — Progress Notes (Signed)
This RN sent a e-pof to scheduling per pt's request to reschedule appt 5/13 due to "planning a trip to [place]".

## 2012-07-02 ENCOUNTER — Other Ambulatory Visit: Payer: Self-pay | Admitting: *Deleted

## 2012-07-05 ENCOUNTER — Telehealth: Payer: Self-pay | Admitting: *Deleted

## 2012-07-05 NOTE — Telephone Encounter (Signed)
Lm informing the pt that the appts scheduled for 5/13 has been moved to 07/26/12. i also made the pt aware that i will mail a letter/cal as well...td

## 2012-07-06 ENCOUNTER — Ambulatory Visit

## 2012-07-06 ENCOUNTER — Ambulatory Visit: Admitting: Physician Assistant

## 2012-07-06 ENCOUNTER — Other Ambulatory Visit: Admitting: Lab

## 2012-07-14 ENCOUNTER — Other Ambulatory Visit: Payer: Self-pay | Admitting: *Deleted

## 2012-07-14 DIAGNOSIS — C9110 Chronic lymphocytic leukemia of B-cell type not having achieved remission: Secondary | ICD-10-CM

## 2012-07-14 MED ORDER — ACYCLOVIR 400 MG PO TABS: 400.0000 mg | ORAL_TABLET | Freq: Two times a day (BID) | ORAL | Status: DC

## 2012-07-26 ENCOUNTER — Telehealth: Payer: Self-pay | Admitting: Oncology

## 2012-07-26 ENCOUNTER — Other Ambulatory Visit (HOSPITAL_BASED_OUTPATIENT_CLINIC_OR_DEPARTMENT_OTHER): Admitting: Lab

## 2012-07-26 ENCOUNTER — Encounter: Payer: Self-pay | Admitting: Physician Assistant

## 2012-07-26 ENCOUNTER — Ambulatory Visit (HOSPITAL_BASED_OUTPATIENT_CLINIC_OR_DEPARTMENT_OTHER): Admitting: Physician Assistant

## 2012-07-26 VITALS — BP 105/66 | HR 71 | Temp 98.4°F | Resp 20 | Ht 58.5 in | Wt 120.0 lb

## 2012-07-26 DIAGNOSIS — F411 Generalized anxiety disorder: Secondary | ICD-10-CM

## 2012-07-26 DIAGNOSIS — C9110 Chronic lymphocytic leukemia of B-cell type not having achieved remission: Secondary | ICD-10-CM

## 2012-07-26 LAB — CBC WITH DIFFERENTIAL/PLATELET
BASO%: 1.9 % (ref 0.0–2.0)
HCT: 41.1 % (ref 34.8–46.6)
HGB: 13.7 g/dL (ref 11.6–15.9)
MCHC: 33.3 g/dL (ref 31.5–36.0)
MONO#: 0.5 10*3/uL (ref 0.1–0.9)
NEUT#: 2.1 10*3/uL (ref 1.5–6.5)
NEUT%: 28.7 % — ABNORMAL LOW (ref 38.4–76.8)
WBC: 7.4 10*3/uL (ref 3.9–10.3)
lymph#: 4.6 10*3/uL — ABNORMAL HIGH (ref 0.9–3.3)

## 2012-07-26 NOTE — Progress Notes (Signed)
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 She has been treated with Rituxan, ofatumumab, and cladribine, with details noted below.   INTERVAL HISTORY: Rachel [name] returns today for followup of her chronic lymphoid leukemia. She completed 6 cycles of cladribine and has been receiving monthly ofatumumab. She is due for her last monthly dose today.   She just returned from a trip to [place] which she enjoyed, but she's had a lot of stress in her life recently, and has some difficult family situations she is dealing with. She feels tired and fatigued. She does continue to exercise on a regular basis but just doesn't feel "motivated" to do anything else. She admits to being depressed, but denies any suicidal ideation. We discussed the possibility of antidepressants which she is not interested in.  REVIEW OF SYSTEMS: Rachel [name] has had no fevers or chills. She does have some hot flashes and night sweats which are not new. She takes trazodone to sleep at night.  She denies any skin changes and has had no abnormal bruising or bleeding. She's eating fairly well and denies any nausea or change in bowel habits. She denies any cough, increased shortness of breath, or chest pain. She's had no dizziness or change in vision, but has had some "stress headaches". She denies any pain elsewhere, specifically no unusual myalgias, arthralgias, or bony pain. She's had no peripheral swelling.   A detailed review of systems is otherwise stable and noncontributory.   PAST MEDICAL HISTORY: Past Medical History  Diagnosis Date  . Cancer   . Leukemia   . Lower back pain     PAST SURGICAL HISTORY: Past Surgical History  Procedure Laterality Date  . Tonsillectomy    . Other surgical history      removal of uterine lining    FAMILY  HISTORY No family history on file.  GYNECOLOGIC HISTORY:  SOCIAL HISTORY: Her husband Rachel [name] is retired from [employer]. Her son Rachel [name] lives in [place] and has 3 sons; he works for a Education officer, environmental. A second son lives in [place] and has two children. Son Rachel [name] lives in [place] and has a daughter, as well as 2 sons by marriage.   ADVANCED DIRECTIVES:  HEALTH MAINTENANCE: History  Substance Use Topics  . Smoking status: Never Smoker   . Smokeless tobacco: Never Used  . Alcohol Use: Yes     Colonoscopy: due  PAP: December 2012/ [name]  Bone density: Due  Lipid panel: per Dr Rachel [name]  Allergies  Allergen Reactions  . Penicillins     "drops my heartbeat" "I just pass out"    Current Outpatient Prescriptions  Medication Sig Dispense Refill  . acyclovir (ZOVIRAX) 400 MG tablet Take 1 tablet (400 mg total) by mouth 2 (two) times daily.  60 tablet  3  . aspirin 81 MG tablet Take 81 mg by mouth daily.      . Calcium Citrate (CITRACAL PO) Take 4 tablets by mouth daily.       . Cholecalciferol (VITAMIN D-3 PO) Take 4,000 Int'l Units by mouth.      . Estradiol (ELESTRIN) 0.52 MG/0.87 GM (0.06%) GEL Apply 1 application topically at bedtime.       . lidocaine-prilocaine (EMLA) cream Apply topically as needed. APPLY NICKEL SIZE AMOUNT TO SKIN ON TOP OF PORT- COVER WITH SARAN WRAP AT LEAST 1 HOUR BEFORE  ACCESS  30 g  1  . ondansetron (ZOFRAN) 8 MG tablet Take 1 tablet (8 mg total) by mouth every 12 (twelve) hours as needed for nausea.  20 tablet  1  . prochlorperazine (COMPAZINE) 10 MG tablet Take 1 tablet (10 mg total) by mouth every 6 (six) hours as needed.  30 tablet  0  . Progesterone Micronized (PROMETRIUM PO) Take 200 mg by mouth. 1st - 12th day of every other month      . sulfamethoxazole-trimethoprim (BACTRIM DS,SEPTRA DS) 800-160 MG per tablet Take 1 tablet by mouth 3 (three) times a week. Take Monday, Wednesday and Friday      . traZODone (DESYREL) 50 MG tablet Take 50 mg  by mouth at bedtime.      Marland Kitchen allopurinol (ZYLOPRIM) 300 MG tablet Take 300 mg by mouth daily.      . methylPREDNISolone (MEDROL DOSEPAK) 4 MG tablet follow package directions  21 tablet  0   No current facility-administered medications for this visit.   Facility-Administered Medications Ordered in Other Visits  Medication Dose Route Frequency Provider Last Rate Last Dose  . 0.9 %  sodium chloride infusion   Intravenous Once Rachel Dell, MD      . sodium chloride 0.9 % injection 10 mL  10 mL Intracatheter PRN Rachel Dell, MD   10 mL at 03/23/12 1234  . sodium chloride 0.9 % injection 10 mL  10 mL Intracatheter PRN Rachel Dell, MD        OBJECTIVE: Middle-aged white woman in no acute distress Filed Vitals:   07/26/12 1034  BP: 105/66  Pulse: 71  Temp: 98.4 F (36.9 C)  Resp: 20   Filed Weights   07/26/12 1034  Weight: 120 lb (54.432 kg)   Body mass index is 24.65 kg/(m^2).    ECOG FS: 1  Sclerae unicteric Oropharynx clear, no ulcerations or thrush No cervical, supraclavicular, or axillary adenopathy palpated. Lungs clear to auscultation, no rales or rhonchi Heart regular rate and rhythm, no murmur Abd benign, no splenomegaly, soft, nontender to palpation, positive bowel sounds MSK no focal spinal tenderness, no peripheral edema Neuro: nonfocal; well oriented; positive affect Breast exam: deferred Port is intact in the right upper chest wall with no edema, erythema, or evidence of infection.    LAB RESULTS: Lab Results  Component Value Date   WBC 7.4 07/26/2012   NEUTROABS 2.1 07/26/2012   HGB 13.7 07/26/2012   HCT 41.1 07/26/2012   MCV 87.8 07/26/2012   PLT 147 07/26/2012      Chemistry      Component Value Date/Time   NA 139 02/23/2012 1249   NA 138 12/15/2011 1322   K 4.2 02/23/2012 1249   K 3.7 12/15/2011 1322   CL 104 02/23/2012 1249   CL 101 12/15/2011 1322   CO2 27 02/23/2012 1249   CO2 26 12/15/2011 1322   BUN 18 02/23/2012 1249   BUN 14.0  12/15/2011 1322   CREATININE 0.71 02/23/2012 1249   CREATININE 1.1 12/15/2011 1322      Component Value Date/Time   CALCIUM 8.9 02/23/2012 1249   CALCIUM 9.9 12/15/2011 1322   ALKPHOS 78 01/14/2012 2153   ALKPHOS 104 12/15/2011 1322   AST 54* 01/14/2012 2153   AST 22 12/15/2011 1322   ALT 43* 01/14/2012 2153   ALT 21 12/15/2011 1322   BILITOT 0.2* 01/14/2012 2153   BILITOT 0.30 12/15/2011 1322       STUDIES:  No results  found.      ASSESSMENT: 62 y.o.  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  She will need to have labs drawn and her port flushed every 6 weeks as she intends to keep it. She does have some long trips coming up, and some of these may need to be done every 8 weeks rather than every 6 weeks which should not be a problem.  She will return to see Dr. Darnelle Catalan in 3 months, but knows to call us prior to that time with any changes or problems.  Of note we did discuss the possibility of starting an antidepressant and she "doesn't want any more pills". She was also made aware of the availability of counseling through our office. At this time she declines.   Zollie Scale    07/26/2012

## 2012-07-27 ENCOUNTER — Ambulatory Visit

## 2012-07-27 ENCOUNTER — Other Ambulatory Visit (HOSPITAL_COMMUNITY): Payer: Self-pay | Admitting: Oncology

## 2012-07-28 ENCOUNTER — Ambulatory Visit (HOSPITAL_BASED_OUTPATIENT_CLINIC_OR_DEPARTMENT_OTHER)

## 2012-07-28 VITALS — BP 104/73 | HR 89 | Temp 97.2°F | Resp 20

## 2012-07-28 DIAGNOSIS — C9110 Chronic lymphocytic leukemia of B-cell type not having achieved remission: Secondary | ICD-10-CM

## 2012-07-28 DIAGNOSIS — Z452 Encounter for adjustment and management of vascular access device: Secondary | ICD-10-CM

## 2012-07-28 MED ORDER — SODIUM CHLORIDE 0.9 % IJ SOLN
10.0000 mL | INTRAMUSCULAR | Status: DC | PRN
  Administered 2012-07-28: 10 mL via INTRAVENOUS
  Filled 2012-07-28: qty 10

## 2012-07-28 MED ORDER — HEPARIN SOD (PORK) LOCK FLUSH 100 UNIT/ML IV SOLN
500.0000 [IU] | Freq: Once | INTRAVENOUS | Status: AC
  Administered 2012-07-28: 500 [IU] via INTRAVENOUS
  Filled 2012-07-28: qty 5

## 2012-07-28 NOTE — Patient Instructions (Signed)
Call MD for problems or concerns 

## 2012-08-07 ENCOUNTER — Other Ambulatory Visit: Payer: Self-pay | Admitting: Obstetrics & Gynecology

## 2012-08-25 ENCOUNTER — Telehealth: Payer: Self-pay | Admitting: *Deleted

## 2012-08-25 NOTE — Telephone Encounter (Signed)
Patient calling to ask if ok with Dr Darnelle Catalan if she uses Imiquimod 5% cream on a viral plantar wart, to be prescribed by dermatologist. OK per Dr Darnelle Catalan to use as directed by MD. Jeanene Erb and left message on voicemail.

## 2012-08-30 ENCOUNTER — Other Ambulatory Visit: Payer: Self-pay | Admitting: Obstetrics & Gynecology

## 2012-08-31 ENCOUNTER — Other Ambulatory Visit: Payer: Self-pay | Admitting: *Deleted

## 2012-08-31 NOTE — Progress Notes (Signed)
Message left by pt stating she will be traveling to [place] 9/2/201 to 12/09/2012 and will need to reschedule appointments for 10/26/2012.  Rachel [name] is requesting lab with flush before she leaves and then lab and MD appointment upon her return.  This RN sent Onc TX request per above to scheduling.

## 2012-09-01 ENCOUNTER — Telehealth: Payer: Self-pay | Admitting: Oncology

## 2012-09-01 NOTE — Telephone Encounter (Signed)
, °

## 2012-09-13 ENCOUNTER — Other Ambulatory Visit (HOSPITAL_BASED_OUTPATIENT_CLINIC_OR_DEPARTMENT_OTHER): Admitting: Lab

## 2012-09-13 ENCOUNTER — Ambulatory Visit (HOSPITAL_BASED_OUTPATIENT_CLINIC_OR_DEPARTMENT_OTHER)

## 2012-09-13 VITALS — BP 104/57 | HR 64 | Temp 97.8°F | Resp 18

## 2012-09-13 DIAGNOSIS — C9110 Chronic lymphocytic leukemia of B-cell type not having achieved remission: Secondary | ICD-10-CM

## 2012-09-13 LAB — CBC WITH DIFFERENTIAL/PLATELET
BASO%: 0.9 % (ref 0.0–2.0)
EOS%: 1.2 % (ref 0.0–7.0)
HGB: 13.6 g/dL (ref 11.6–15.9)
LYMPH%: 61.7 % — ABNORMAL HIGH (ref 14.0–49.7)
MCV: 86 fL (ref 79.5–101.0)
MONO%: 6.3 % (ref 0.0–14.0)
NEUT#: 4.2 10*3/uL (ref 1.5–6.5)
NEUT%: 29.9 % — ABNORMAL LOW (ref 38.4–76.8)
RDW: 11.5 % (ref 11.2–14.5)
lymph#: 8.7 10*3/uL — ABNORMAL HIGH (ref 0.9–3.3)

## 2012-09-13 LAB — TECHNOLOGIST REVIEW

## 2012-09-13 MED ORDER — SODIUM CHLORIDE 0.9 % IJ SOLN
10.0000 mL | INTRAMUSCULAR | Status: DC | PRN
  Administered 2012-09-13: 10 mL via INTRAVENOUS
  Filled 2012-09-13: qty 10

## 2012-09-13 MED ORDER — HEPARIN SOD (PORK) LOCK FLUSH 100 UNIT/ML IV SOLN
500.0000 [IU] | Freq: Once | INTRAVENOUS | Status: AC
  Administered 2012-09-13: 500 [IU] via INTRAVENOUS
  Filled 2012-09-13: qty 5

## 2012-10-04 ENCOUNTER — Other Ambulatory Visit: Payer: Self-pay | Admitting: *Deleted

## 2012-10-04 DIAGNOSIS — N951 Menopausal and female climacteric states: Secondary | ICD-10-CM

## 2012-10-05 ENCOUNTER — Other Ambulatory Visit

## 2012-10-05 DIAGNOSIS — N951 Menopausal and female climacteric states: Secondary | ICD-10-CM

## 2012-10-11 ENCOUNTER — Telehealth: Payer: Self-pay | Admitting: Oncology

## 2012-10-11 NOTE — Telephone Encounter (Signed)
Faxed pt medical records to [facility]. ° °

## 2012-10-18 ENCOUNTER — Other Ambulatory Visit (HOSPITAL_BASED_OUTPATIENT_CLINIC_OR_DEPARTMENT_OTHER): Admitting: Lab

## 2012-10-18 ENCOUNTER — Ambulatory Visit (HOSPITAL_BASED_OUTPATIENT_CLINIC_OR_DEPARTMENT_OTHER)

## 2012-10-18 VITALS — BP 116/49 | HR 79 | Temp 98.3°F

## 2012-10-18 DIAGNOSIS — Z452 Encounter for adjustment and management of vascular access device: Secondary | ICD-10-CM

## 2012-10-18 DIAGNOSIS — C9110 Chronic lymphocytic leukemia of B-cell type not having achieved remission: Secondary | ICD-10-CM

## 2012-10-18 LAB — CBC WITH DIFFERENTIAL/PLATELET
BASO%: 1.3 % (ref 0.0–2.0)
EOS%: 1.4 % (ref 0.0–7.0)
LYMPH%: 68.9 % — ABNORMAL HIGH (ref 14.0–49.7)
MCHC: 33.5 g/dL (ref 31.5–36.0)
MCV: 86.5 fL (ref 79.5–101.0)
MONO#: 1.1 10*3/uL — ABNORMAL HIGH (ref 0.1–0.9)
MONO%: 5.8 % (ref 0.0–14.0)
Platelets: 171 10*3/uL (ref 145–400)
RBC: 4.62 10*6/uL (ref 3.70–5.45)
WBC: 19.1 10*3/uL — ABNORMAL HIGH (ref 3.9–10.3)

## 2012-10-18 LAB — TECHNOLOGIST REVIEW

## 2012-10-18 MED ORDER — SODIUM CHLORIDE 0.9 % IJ SOLN
10.0000 mL | INTRAMUSCULAR | Status: DC | PRN
  Administered 2012-10-18: 10 mL via INTRAVENOUS
  Filled 2012-10-18: qty 10

## 2012-10-18 MED ORDER — HEPARIN SOD (PORK) LOCK FLUSH 100 UNIT/ML IV SOLN
500.0000 [IU] | Freq: Once | INTRAVENOUS | Status: AC
  Administered 2012-10-18: 500 [IU] via INTRAVENOUS
  Filled 2012-10-18: qty 5

## 2012-10-18 NOTE — Patient Instructions (Addendum)
Implanted Port Instructions  An implanted port is a central line that has a round shape and is placed under the skin. It is used for long-term IV (intravenous) access for:  · Medicine.  · Fluids.  · Liquid nutrition, such as TPN (total parenteral nutrition).  · Blood samples.  Ports can be placed:  · In the chest area just below the collarbone (this is the most common place.)  · In the arms.  · In the belly (abdomen) area.  · In the legs.  PARTS OF THE PORT  A port has 2 main parts:  · The reservoir. The reservoir is round, disc-shaped, and will be a small, raised area under your skin.  · The reservoir is the part where a needle is inserted (accessed) to either give medicines or to draw blood.  · The catheter. The catheter is a long, slender tube that extends from the reservoir. The catheter is placed into a large vein.  · Medicine that is inserted into the reservoir goes into the catheter and then into the vein.  INSERTION OF THE PORT  · The port is surgically placed in either an operating room or in a procedural area (interventional radiology).  · Medicine may be given to help you relax during the procedure.  · The skin where the port will be inserted is numbed (local anesthetic).  · 1 or 2 small cuts (incisions) will be made in the skin to insert the port.  · The port can be used after it has been inserted.  INCISION SITE CARE  · The incision site may have small adhesive strips on it. This helps keep the incision site closed. Sometimes, no adhesive strips are placed. Instead of adhesive strips, a special kind of surgical glue is used to keep the incision closed.  · If adhesive strips were placed on the incision sites, do not take them off. They will fall off on their own.  · The incision site may be sore for 1 to 2 days. Pain medicine can help.  · Do not get the incision site wet. Bathe or shower as directed by your caregiver.  · The incision site should heal in 5 to 7 days. A small scar may form after the  incision has healed.  ACCESSING THE PORT  Special steps must be taken to access the port:  · Before the port is accessed, a numbing cream can be placed on the skin. This helps numb the skin over the port site.  · A sterile technique is used to access the port.  · The port is accessed with a needle. Only "non-coring" port needles should be used to access the port. Once the port is accessed, a blood return should be checked. This helps ensure the port is in the vein and is not clogged (clotted).  · If your caregiver believes your port should remain accessed, a clear (transparent) bandage will be placed over the needle site. The bandage and needle will need to be changed every week or as directed by your caregiver.  · Keep the bandage covering the needle clean and dry. Do not get it wet. Follow your caregiver's instructions on how to take a shower or bath when the port is accessed.  · If your port does not need to stay accessed, no bandage is needed over the port.  FLUSHING THE PORT  Flushing the port keeps it from getting clogged. How often the port is flushed depends on:  · If a   constant infusion is running. If a constant infusion is running, the port may not need to be flushed.  · If intermittent medicines are given.  · If the port is not being used.  For intermittent medicines:  · The port will need to be flushed:  · After medicines have been given.  · After blood has been drawn.  · As part of routine maintenance.  · A port is normally flushed with:  · Normal saline.  · Heparin.  · Follow your caregiver's advice on how often, how much, and the type of flush to use on your port.  IMPORTANT PORT INFORMATION  · Tell your caregiver if you are allergic to heparin.  · After your port is placed, you will get a manufacturer's information card. The card has information about your port. Keep this card with you at all times.  · There are many types of ports available. Know what kind of port you have.  · In case of an  emergency, it may be helpful to wear a medical alert bracelet. This can help alert health care workers that you have a port.  · The port can stay in for as long as your caregiver believes it is necessary.  · When it is time for the port to come out, surgery will be done to remove it. The surgery will be similar to how the port was put in.  · If you are in the hospital or clinic:  · Your port will be taken care of and flushed by a nurse.  · If you are at home:  · A home health care nurse may give medicines and take care of the port.  · You or a family member can get special training and directions for giving medicine and taking care of the port at home.  SEEK IMMEDIATE MEDICAL CARE IF:   · Your port does not flush or you are unable to get a blood return.  · New drainage or pus is coming from the incision.  · A bad smell is coming from the incision site.  · You develop swelling or increased redness at the incision site.  · You develop increased swelling or pain at the port site.  · You develop swelling or pain in the surrounding skin near the port.  · You have an oral temperature above 102° F (38.9° C), not controlled by medicine.  MAKE SURE YOU:   · Understand these instructions.  · Will watch your condition.  · Will get help right away if you are not doing well or get worse.  Document Released: 02/10/2005 Document Revised: 05/05/2011 Document Reviewed: 05/04/2008  Exit[employer]® Patient Information ©2014 [employer].

## 2012-10-26 ENCOUNTER — Ambulatory Visit: Admitting: Oncology

## 2012-10-26 ENCOUNTER — Other Ambulatory Visit: Admitting: Lab

## 2012-10-26 ENCOUNTER — Encounter

## 2012-12-12 ENCOUNTER — Other Ambulatory Visit: Payer: Self-pay | Admitting: Obstetrics & Gynecology

## 2012-12-20 ENCOUNTER — Other Ambulatory Visit: Payer: Self-pay | Admitting: Physician Assistant

## 2012-12-20 DIAGNOSIS — C9110 Chronic lymphocytic leukemia of B-cell type not having achieved remission: Secondary | ICD-10-CM

## 2012-12-21 ENCOUNTER — Ambulatory Visit (HOSPITAL_BASED_OUTPATIENT_CLINIC_OR_DEPARTMENT_OTHER): Admitting: Oncology

## 2012-12-21 ENCOUNTER — Telehealth: Payer: Self-pay | Admitting: Oncology

## 2012-12-21 ENCOUNTER — Ambulatory Visit

## 2012-12-21 ENCOUNTER — Other Ambulatory Visit (HOSPITAL_BASED_OUTPATIENT_CLINIC_OR_DEPARTMENT_OTHER): Admitting: Lab

## 2012-12-21 VITALS — BP 106/70 | HR 80 | Temp 98.2°F | Resp 18 | Ht 58.5 in | Wt 121.4 lb

## 2012-12-21 DIAGNOSIS — C9110 Chronic lymphocytic leukemia of B-cell type not having achieved remission: Secondary | ICD-10-CM

## 2012-12-21 DIAGNOSIS — M549 Dorsalgia, unspecified: Secondary | ICD-10-CM

## 2012-12-21 LAB — CBC WITH DIFFERENTIAL/PLATELET
BASO%: 1 % (ref 0.0–2.0)
EOS%: 0.7 % (ref 0.0–7.0)
MCH: 29 pg (ref 25.1–34.0)
MCHC: 33.2 g/dL (ref 31.5–36.0)
RBC: 4.48 10*6/uL (ref 3.70–5.45)
RDW: 12.2 % (ref 11.2–14.5)
lymph#: 22.1 10*3/uL — ABNORMAL HIGH (ref 0.9–3.3)

## 2012-12-21 LAB — MORPHOLOGY: PLT EST: ADEQUATE

## 2012-12-21 MED ORDER — SODIUM CHLORIDE 0.9 % IJ SOLN
10.0000 mL | INTRAMUSCULAR | Status: DC | PRN
  Administered 2012-12-21: 10 mL via INTRAVENOUS
  Filled 2012-12-21: qty 10

## 2012-12-21 MED ORDER — HEPARIN SOD (PORK) LOCK FLUSH 100 UNIT/ML IV SOLN
500.0000 [IU] | Freq: Once | INTRAVENOUS | Status: AC
  Administered 2012-12-21: 500 [IU] via INTRAVENOUS
  Filled 2012-12-21: qty 5

## 2012-12-21 NOTE — Progress Notes (Signed)
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 She has been treated with Rituxan, ofatumumab, and cladribine, with details noted below.   INTERVAL HISTORY: Rachel [name] returns today for followup of her chronic lymphoid leukemia. Since her last visit here she ahd a long trip to [place] which she greatly enjoyed. As soon as she returned the old problems at home returned and she started having stress headaches.   REVIEW OF SYSTEMS: Aside from the stress issue, she had some sinus problems which were treated with antibiotics and decongestants. She is having significant hot flashes, as before. She sleeps poorly. She has some heaertburn and low back pain, but neither of these problems is more intense or persistent than prior. A detailed review of systems is otherwise negative and in particular she does not give me any "B" symptoms  PAST MEDICAL HISTORY: Past Medical History  Diagnosis Date  . Cancer   . Leukemia   . Lower back pain     PAST SURGICAL HISTORY: Past Surgical History  Procedure Laterality Date  . Tonsillectomy    . Other surgical history      removal of uterine lining    FAMILY HISTORY No family history on file.  GYNECOLOGIC HISTORY:  SOCIAL HISTORY: Her husband Rachel [name] is retired from [employer]. Her son Rachel [name] lives in [place] and has 3 sons; he works for a Education officer, environmental. A second son lives in [place] and has two children. Son Rachel [name] lives in [place] and has a daughter, as well as 2 sons by marriage.   ADVANCED DIRECTIVES:  HEALTH MAINTENANCE: History  Substance Use Topics  . Smoking status: Never Smoker   . Smokeless tobacco: Never Used  . Alcohol Use: Yes     Colonoscopy: due  PAP: December 2012/ Rachel [name]  Bone density:  Due  Lipid panel: per Dr Rachel [name]  Allergies  Allergen Reactions  . Penicillins     "drops my heartbeat" "I just pass out"    Current Outpatient Prescriptions  Medication Sig Dispense Refill  . acyclovir (ZOVIRAX) 400 MG tablet Take 1 tablet (400 mg total) by mouth 2 (two) times daily.  60 tablet  3  . allopurinol (ZYLOPRIM) 300 MG tablet Take 300 mg by mouth daily.      Marland Kitchen aspirin 81 MG tablet Take 81 mg by mouth daily.      . Calcium Citrate (CITRACAL PO) Take 4 tablets by mouth daily.       . Cholecalciferol (VITAMIN D-3 PO) Take 4,000 Int'l Units by mouth.      . ELESTRIN 0.52 MG/0.87 GM (0.06%) GEL APPLY 2 PUMPS THINLY TO UPPER ARM DAILY AT BEDTIME  4 g  2  . lidocaine-prilocaine (EMLA) cream Apply topically as needed. APPLY NICKEL SIZE AMOUNT TO SKIN ON TOP OF PORT- COVER WITH SARAN WRAP AT LEAST 1 HOUR BEFORE ACCESS  30 g  1  . methylPREDNISolone (MEDROL DOSEPAK) 4 MG tablet follow package directions  21 tablet  0  . ondansetron (ZOFRAN) 8 MG tablet Take 1 tablet (8 mg total) by mouth every 12 (twelve) hours as needed for nausea.  20 tablet  1  . prochlorperazine (COMPAZINE) 10 MG tablet Take 1 tablet (10 mg total) by mouth every 6 (six) hours as needed.  30 tablet  0  .  progesterone (PROMETRIUM) 200 MG capsule TAKE 1 CAPSULE DAYS 1 THROUGH 12 EVERY OTHER MONTH  24 capsule  0  . Progesterone Micronized (PROMETRIUM PO) Take 200 mg by mouth. 1st - 12th day of every other month      . sulfamethoxazole-trimethoprim (BACTRIM DS,SEPTRA DS) 800-160 MG per tablet Take 1 tablet by mouth 3 (three) times a week. Take Monday, Wednesday and Friday      . traZODone (DESYREL) 50 MG tablet Take 50 mg by mouth at bedtime.       No current facility-administered medications for this visit.   Facility-Administered Medications Ordered in Other Visits  Medication Dose Route Frequency Provider Last Rate Last Dose  . 0.9 %  sodium chloride infusion   Intravenous Once Rachel Dell, MD      . sodium  chloride 0.9 % injection 10 mL  10 mL Intracatheter PRN Rachel Dell, MD   10 mL at 03/23/12 1234  . sodium chloride 0.9 % injection 10 mL  10 mL Intracatheter PRN Rachel Dell, MD        OBJECTIVE: Middle-aged white woman who appears stated age  62 Vitals:   12/21/12 1400  BP: 106/70  Pulse: 80  Temp: 98.2 F (36.8 C)  Resp: 18   Filed Weights   12/21/12 1400  Weight: 121 lb 6.4 oz (55.067 kg)   Body mass index is 24.94 kg/(m^2).    ECOG FS: 1  Sclerae unicteric Oropharynx clear No cervical, supraclavicular, or axillary adenopathy palpated. Lungs clear to auscultation, no rales or rhonchi Heart regular rate and rhythm, no murmur Abd benign, no splenomegaly, soft, nontender, , positive bowel sounds MSK no focal spinal tenderness, no peripheral edema Neuro: nonfocal; well oriented; stressed affect Breast exam: deferred Port is intact in the right upper chest wall   LAB RESULTS:  Results for Rachel, [name] (MRN [id number]) as of 12/21/2012 14:02  Ref. Range 06/08/2012 08:23 07/26/2012 10:24 09/13/2012 10:18 10/18/2012 11:36 12/21/2012 13:26  lymph# Latest Range: 0.9-3.3 10e3/uL 3.2 4.6 (H) 8.7 (H) 13.2 (H) 22.1 (H)    Lab Results  Component Value Date   WBC 27.8* 12/21/2012   NEUTROABS 4.0 12/21/2012   HGB 13.0 12/21/2012   HCT 39.1 12/21/2012   MCV 87.3 12/21/2012   PLT 160 12/21/2012      Chemistry      Component Value Date/Time   NA 139 02/23/2012 1249   NA 138 12/15/2011 1322   K 4.2 02/23/2012 1249   K 3.7 12/15/2011 1322   CL 104 02/23/2012 1249   CL 101 12/15/2011 1322   CO2 27 02/23/2012 1249   CO2 26 12/15/2011 1322   BUN 18 02/23/2012 1249   BUN 14.0 12/15/2011 1322   CREATININE 0.71 02/23/2012 1249   CREATININE 1.1 12/15/2011 1322      Component Value Date/Time   CALCIUM 8.9 02/23/2012 1249   CALCIUM 9.9 12/15/2011 1322   ALKPHOS 78 01/14/2012 2153   ALKPHOS 104 12/15/2011 1322   AST 54* 01/14/2012 2153   AST 22 12/15/2011 1322    ALT 43* 01/14/2012 2153   ALT 21 12/15/2011 1322   BILITOT 0.2* 01/14/2012 2153   BILITOT 0.30 12/15/2011 1322       STUDIES: No results found.  ASSESSMENT: 62 y.o.  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 She continues to ex[perience significant family stress. She is learning to deal with that. From a treatment point of view there is no indication at present for intervention s far as the CLL is concerned. She will have a port flush every 6 weeks, with labs every time, and she will return to see me in three months. She knows tocall for any problems that may develop before then  :Orvie Caradine C    12/21/2012

## 2012-12-21 NOTE — Patient Instructions (Signed)
Implanted Port Instructions  An implanted port is a central line that has a round shape and is placed under the skin. It is used for long-term IV (intravenous) access for:  · Medicine.  · Fluids.  · Liquid nutrition, such as TPN (total parenteral nutrition).  · Blood samples.  Ports can be placed:  · In the chest area just below the collarbone (this is the most common place.)  · In the arms.  · In the belly (abdomen) area.  · In the legs.  PARTS OF THE PORT  A port has 2 main parts:  · The reservoir. The reservoir is round, disc-shaped, and will be a small, raised area under your skin.  · The reservoir is the part where a needle is inserted (accessed) to either give medicines or to draw blood.  · The catheter. The catheter is a long, slender tube that extends from the reservoir. The catheter is placed into a large vein.  · Medicine that is inserted into the reservoir goes into the catheter and then into the vein.  INSERTION OF THE PORT  · The port is surgically placed in either an operating room or in a procedural area (interventional radiology).  · Medicine may be given to help you relax during the procedure.  · The skin where the port will be inserted is numbed (local anesthetic).  · 1 or 2 small cuts (incisions) will be made in the skin to insert the port.  · The port can be used after it has been inserted.  INCISION SITE CARE  · The incision site may have small adhesive strips on it. This helps keep the incision site closed. Sometimes, no adhesive strips are placed. Instead of adhesive strips, a special kind of surgical glue is used to keep the incision closed.  · If adhesive strips were placed on the incision sites, do not take them off. They will fall off on their own.  · The incision site may be sore for 1 to 2 days. Pain medicine can help.  · Do not get the incision site wet. Bathe or shower as directed by your caregiver.  · The incision site should heal in 5 to 7 days. A small scar may form after the  incision has healed.  ACCESSING THE PORT  Special steps must be taken to access the port:  · Before the port is accessed, a numbing cream can be placed on the skin. This helps numb the skin over the port site.  · A sterile technique is used to access the port.  · The port is accessed with a needle. Only "non-coring" port needles should be used to access the port. Once the port is accessed, a blood return should be checked. This helps ensure the port is in the vein and is not clogged (clotted).  · If your caregiver believes your port should remain accessed, a clear (transparent) bandage will be placed over the needle site. The bandage and needle will need to be changed every week or as directed by your caregiver.  · Keep the bandage covering the needle clean and dry. Do not get it wet. Follow your caregiver's instructions on how to take a shower or bath when the port is accessed.  · If your port does not need to stay accessed, no bandage is needed over the port.  FLUSHING THE PORT  Flushing the port keeps it from getting clogged. How often the port is flushed depends on:  · If a   constant infusion is running. If a constant infusion is running, the port may not need to be flushed.  · If intermittent medicines are given.  · If the port is not being used.  For intermittent medicines:  · The port will need to be flushed:  · After medicines have been given.  · After blood has been drawn.  · As part of routine maintenance.  · A port is normally flushed with:  · Normal saline.  · Heparin.  · Follow your caregiver's advice on how often, how much, and the type of flush to use on your port.  IMPORTANT PORT INFORMATION  · Tell your caregiver if you are allergic to heparin.  · After your port is placed, you will get a manufacturer's information card. The card has information about your port. Keep this card with you at all times.  · There are many types of ports available. Know what kind of port you have.  · In case of an  emergency, it may be helpful to wear a medical alert bracelet. This can help alert health care workers that you have a port.  · The port can stay in for as long as your caregiver believes it is necessary.  · When it is time for the port to come out, surgery will be done to remove it. The surgery will be similar to how the port was put in.  · If you are in the hospital or clinic:  · Your port will be taken care of and flushed by a nurse.  · If you are at home:  · A home health care nurse may give medicines and take care of the port.  · You or a family member can get special training and directions for giving medicine and taking care of the port at home.  SEEK IMMEDIATE MEDICAL CARE IF:   · Your port does not flush or you are unable to get a blood return.  · New drainage or pus is coming from the incision.  · A bad smell is coming from the incision site.  · You develop swelling or increased redness at the incision site.  · You develop increased swelling or pain at the port site.  · You develop swelling or pain in the surrounding skin near the port.  · You have an oral temperature above 102° F (38.9° C), not controlled by medicine.  MAKE SURE YOU:   · Understand these instructions.  · Will watch your condition.  · Will get help right away if you are not doing well or get worse.  Document Released: 02/10/2005 Document Revised: 05/05/2011 Document Reviewed: 05/04/2008  ExitCare® Patient Information ©2014 [employer].

## 2012-12-21 NOTE — Telephone Encounter (Signed)
gv and printed appt sched and avs for pt for Jan...per pt she is going to be out of town starting the end of NOV till the middle of Jan...Dr. Darnelle Catalan aware

## 2013-01-13 ENCOUNTER — Other Ambulatory Visit: Payer: Self-pay | Admitting: Radiology

## 2013-01-18 ENCOUNTER — Encounter: Payer: Self-pay | Admitting: Obstetrics

## 2013-02-24 HISTORY — PX: BREAST SURGERY: SHX581

## 2013-02-27 ENCOUNTER — Other Ambulatory Visit: Payer: Self-pay | Admitting: Obstetrics & Gynecology

## 2013-03-15 ENCOUNTER — Telehealth: Payer: Self-pay | Admitting: Oncology

## 2013-03-15 ENCOUNTER — Other Ambulatory Visit (HOSPITAL_BASED_OUTPATIENT_CLINIC_OR_DEPARTMENT_OTHER)

## 2013-03-15 ENCOUNTER — Other Ambulatory Visit: Payer: Self-pay | Admitting: *Deleted

## 2013-03-15 ENCOUNTER — Ambulatory Visit (HOSPITAL_BASED_OUTPATIENT_CLINIC_OR_DEPARTMENT_OTHER): Admitting: Oncology

## 2013-03-15 ENCOUNTER — Ambulatory Visit

## 2013-03-15 VITALS — BP 112/48 | HR 68 | Temp 96.9°F | Resp 17

## 2013-03-15 VITALS — BP 106/71 | HR 64 | Temp 97.6°F | Resp 18 | Ht 58.5 in | Wt 120.8 lb

## 2013-03-15 DIAGNOSIS — Z95828 Presence of other vascular implants and grafts: Secondary | ICD-10-CM

## 2013-03-15 DIAGNOSIS — C9110 Chronic lymphocytic leukemia of B-cell type not having achieved remission: Secondary | ICD-10-CM

## 2013-03-15 LAB — CBC WITH DIFFERENTIAL/PLATELET
BASO%: 0.5 % (ref 0.0–2.0)
Basophils Absolute: 0.3 10*3/uL — ABNORMAL HIGH (ref 0.0–0.1)
EOS%: 0.5 % (ref 0.0–7.0)
Eosinophils Absolute: 0.3 10*3/uL (ref 0.0–0.5)
HCT: 40.2 % (ref 34.8–46.6)
HGB: 12.9 g/dL (ref 11.6–15.9)
LYMPH%: 87.7 % — ABNORMAL HIGH (ref 14.0–49.7)
MCH: 29 pg (ref 25.1–34.0)
MCHC: 32.1 g/dL (ref 31.5–36.0)
MCV: 90.3 fL (ref 79.5–101.0)
MONO#: 1.3 10*3/uL — AB (ref 0.1–0.9)
MONO%: 2.1 % (ref 0.0–14.0)
NEUT%: 9.2 % — ABNORMAL LOW (ref 38.4–76.8)
NEUTROS ABS: 5.6 10*3/uL (ref 1.5–6.5)
Platelets: 152 10*3/uL (ref 145–400)
RBC: 4.45 10*6/uL (ref 3.70–5.45)
RDW: 12.4 % (ref 11.2–14.5)
WBC: 61.2 10*3/uL — AB (ref 3.9–10.3)
lymph#: 53.7 10*3/uL — ABNORMAL HIGH (ref 0.9–3.3)

## 2013-03-15 LAB — TECHNOLOGIST REVIEW

## 2013-03-15 MED ORDER — HEPARIN SOD (PORK) LOCK FLUSH 100 UNIT/ML IV SOLN
500.0000 [IU] | Freq: Once | INTRAVENOUS | Status: AC
  Administered 2013-03-15: 500 [IU] via INTRAVENOUS
  Filled 2013-03-15: qty 5

## 2013-03-15 MED ORDER — SODIUM CHLORIDE 0.9 % IJ SOLN
10.0000 mL | INTRAMUSCULAR | Status: DC | PRN
  Administered 2013-03-15: 10 mL via INTRAVENOUS
  Filled 2013-03-15: qty 10

## 2013-03-15 NOTE — Progress Notes (Signed)
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 She has been treated with Rituxan, ofatumumab, and cladribine, with details noted below.   INTERVAL HISTORY: Rachel [name] returns today for followup of her chronic lymphoid leukemia. The interval history is generally unremarkable. While there continue to be tensions with [name], they remain together, and they do a lot of traveling together as well. She is enjoying her grandchildren.   REVIEW OF SYSTEMS: She sleeps poorly. This is not a new problem. She has seasonal allergies, but no cough or shortness of breath. She has heartburn and occasional he a loose bowel movement. [place] stress urinary incontinence. She has chronic back and joint pain but this is not more persistent or intense than before. She feels she bruises easily. She has occasional headaches controlled with Tylenol by mouth chiefly. She feels forgetful and depressed at times. Sometimes she drops things particularly with her left hand. Overall the she feels well and she is going to the gym at least twice a week. A detailed review of systems today was otherwise noncontributory  PAST MEDICAL HISTORY: Past Medical History  Diagnosis Date  . Cancer   . Leukemia   . Lower back pain     PAST SURGICAL HISTORY: Past Surgical History  Procedure Laterality Date  . Tonsillectomy    . Other surgical history      removal of uterine lining    FAMILY HISTORY No family history on file.  GYNECOLOGIC HISTORY:  SOCIAL HISTORY: [name] is retired from [employer]. Her son [name] lives in [place] and has 3 sons; he works for a Pharmacist, community. A second son lives in [place] and has two children. Son [name] lives in [place] and has a daughter,  as well as 2 sons by marriage.   ADVANCED DIRECTIVES:  HEALTH MAINTENANCE: History  Substance Use Topics  . Smoking status: Never Smoker   . Smokeless tobacco: Never Used  . Alcohol Use: Yes     Colonoscopy: due  PAP: December 2012/ [name]  Bone density: Due  Lipid panel: per Dr Drema Dallas  Allergies  Allergen Reactions  . Penicillins     "drops my heartbeat" "I just pass out"    Current Outpatient Prescriptions  Medication Sig Dispense Refill  . allopurinol (ZYLOPRIM) 300 MG tablet Take 300 mg by mouth daily.      Marland Kitchen aspirin 81 MG tablet Take 81 mg by mouth daily.      . Calcium Citrate (CITRACAL PO) Take 4 tablets by mouth daily.       . Cholecalciferol (VITAMIN D-3 PO) Take 4,000 Int'l Units by mouth.      . ELESTRIN 0.52 MG/0.87 GM (0.06%) GEL APPLY 2 PUMPS THINLY TO UPPER ARM DAILY AT BEDTIME  4 g  2  . lidocaine-prilocaine (EMLA) cream Apply topically as needed. APPLY NICKEL SIZE AMOUNT TO SKIN ON TOP OF PORT- COVER WITH SARAN WRAP AT LEAST 1 HOUR BEFORE ACCESS  30 g  1  . methylPREDNISolone (MEDROL DOSEPAK) 4 MG tablet follow package directions  21 tablet  0  . progesterone (PROMETRIUM) 200 MG capsule TAKE 1 CAPSULE DAYS 1 THROUGH 12 EVERY OTHER MONTH  24 capsule  3  . Progesterone Micronized (PROMETRIUM PO) Take 200 mg by mouth. 1st - 12th day of  every other month      . traZODone (DESYREL) 50 MG tablet Take 50 mg by mouth at bedtime.       No current facility-administered medications for this visit.   Facility-Administered Medications Ordered in Other Visits  Medication Dose Route Frequency Provider Last Rate Last Dose  . sodium chloride 0.9 % injection 10 mL  10 mL Intracatheter PRN Chauncey Cruel, MD   10 mL at 03/23/12 1234  . sodium chloride 0.9 % injection 10 mL  10 mL Intracatheter PRN Chauncey Cruel, MD        OBJECTIVE: Middle-aged white woman in no acute distress Filed Vitals:   03/15/13 1354  BP: 106/71  Pulse: 64  Temp: 97.6 F (36.4 C)  Resp: 18    Filed Weights   03/15/13 1354  Weight: 120 lb 12.8 oz (54.795 kg)   Body mass index is 24.81 kg/(m^2).    ECOG FS: 1  Sclerae unicteric, pupils equal and round Oropharynx no thrush or other lesions No cervical, supraclavicular, or axillary adenopathy  Lungs clear to auscultation, no rales or rhonchi Heart regular rate and rhythm, no murmur appreciated Abd benign, no splenomegaly, soft, nontender, , positive bowel sounds MSK no focal spinal tenderness, no peripheral edema Neuro: nonfocal; well oriented; pleasant affect Breast exam: deferred Port is intact in the right upper chest wall   LAB RESULTS:  Lab Results  Component Value Date   WBC 61.2* 03/15/2013   NEUTROABS 5.6 03/15/2013   HGB 12.9 03/15/2013   HCT 40.2 03/15/2013   MCV 90.3 03/15/2013   PLT 152 03/15/2013      Chemistry      Component Value Date/Time   NA 139 02/23/2012 1249   NA 138 12/15/2011 1322   K 4.2 02/23/2012 1249   K 3.7 12/15/2011 1322   CL 104 02/23/2012 1249   CL 101 12/15/2011 1322   CO2 27 02/23/2012 1249   CO2 26 12/15/2011 1322   BUN 18 02/23/2012 1249   BUN 14.0 12/15/2011 1322   CREATININE 0.71 02/23/2012 1249   CREATININE 1.1 12/15/2011 1322      Component Value Date/Time   CALCIUM 8.9 02/23/2012 1249   CALCIUM 9.9 12/15/2011 1322   ALKPHOS 78 01/14/2012 2153   ALKPHOS 104 12/15/2011 1322   AST 54* 01/14/2012 2153   AST 22 12/15/2011 1322   ALT 43* 01/14/2012 2153   ALT 21 12/15/2011 1322   BILITOT 0.2* 01/14/2012 2153   BILITOT 0.30 12/15/2011 1322       STUDIES: No results found.   ASSESSMENT: 63 y.o.  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 We're going to do port flushes every 6 weeks, with lab work, and return visit here in 6 months. She knows what "B." symptoms are and she will call if any develop.  Chauncey Cruel    03/15/2013

## 2013-03-15 NOTE — Patient Instructions (Signed)

## 2013-03-15 NOTE — Telephone Encounter (Signed)
, °

## 2013-03-21 ENCOUNTER — Telehealth: Payer: Self-pay | Admitting: Oncology

## 2013-03-21 NOTE — Telephone Encounter (Signed)
per 1/24 pof cxd 4/14 f/u only - lb/flush for 4/14 and all other appts remain the same. lmonvm for pt w/change and each appt d/t. schedule mailed for march thru july

## 2013-04-06 ENCOUNTER — Telehealth: Payer: Self-pay | Admitting: *Deleted

## 2013-04-06 NOTE — Telephone Encounter (Signed)
This RN spoke with Rachel [name] per call stating concern due to onset of " feeling dizzy yesterday then last night just felt very cold like on the inside like my bones were cold and I just couldn't get warm and felt shivery "  Today Rachel [name] feels increased fatigue " but went to [place] anyway but it was a struggle to get through it ".  Presently Rachel [name] feels fatigue, denies fever or active chills, she does not have a cough or other congestion. Note Rachel [name] states she has been around her grandchild who is 1 who has had some upper viral symptoms.  Per discussion this RN recommended Rachel [name] to drink lots of liquids including warm liquids as well as if she feels chilled in her bones to take a warm shower and immediately get into a warm bed or under a heating blanket.  Rachel [name] understands per phone conversation to call if fever, chills, or cough occurs.  No other needs at this time.

## 2013-04-12 ENCOUNTER — Ambulatory Visit: Admitting: Obstetrics

## 2013-04-14 ENCOUNTER — Ambulatory Visit: Admitting: Obstetrics

## 2013-04-19 ENCOUNTER — Ambulatory Visit: Admitting: Obstetrics

## 2013-04-26 ENCOUNTER — Ambulatory Visit (HOSPITAL_BASED_OUTPATIENT_CLINIC_OR_DEPARTMENT_OTHER)

## 2013-04-26 ENCOUNTER — Other Ambulatory Visit (HOSPITAL_BASED_OUTPATIENT_CLINIC_OR_DEPARTMENT_OTHER)

## 2013-04-26 VITALS — BP 95/57 | HR 73 | Temp 98.4°F

## 2013-04-26 DIAGNOSIS — C9110 Chronic lymphocytic leukemia of B-cell type not having achieved remission: Secondary | ICD-10-CM

## 2013-04-26 DIAGNOSIS — Z95828 Presence of other vascular implants and grafts: Secondary | ICD-10-CM

## 2013-04-26 LAB — CBC WITH DIFFERENTIAL/PLATELET
BASO%: 0.6 % (ref 0.0–2.0)
BASOS ABS: 0.4 10*3/uL — AB (ref 0.0–0.1)
EOS ABS: 0.3 10*3/uL (ref 0.0–0.5)
EOS%: 0.5 % (ref 0.0–7.0)
HEMATOCRIT: 39.4 % (ref 34.8–46.6)
HEMOGLOBIN: 12.4 g/dL (ref 11.6–15.9)
LYMPH%: 89.5 % — AB (ref 14.0–49.7)
MCH: 28.8 pg (ref 25.1–34.0)
MCHC: 31.5 g/dL (ref 31.5–36.0)
MCV: 91.5 fL (ref 79.5–101.0)
MONO#: 2 10*3/uL — ABNORMAL HIGH (ref 0.1–0.9)
MONO%: 2.8 % (ref 0.0–14.0)
NEUT%: 6.6 % — AB (ref 38.4–76.8)
NEUTROS ABS: 4.7 10*3/uL (ref 1.5–6.5)
PLATELETS: 169 10*3/uL (ref 145–400)
RBC: 4.3 10*6/uL (ref 3.70–5.45)
RDW: 12.5 % (ref 11.2–14.5)
WBC: 71.2 10*3/uL (ref 3.9–10.3)
lymph#: 63.8 10*3/uL — ABNORMAL HIGH (ref 0.9–3.3)

## 2013-04-26 LAB — TECHNOLOGIST REVIEW

## 2013-04-26 MED ORDER — HEPARIN SOD (PORK) LOCK FLUSH 100 UNIT/ML IV SOLN
500.0000 [IU] | Freq: Once | INTRAVENOUS | Status: AC
  Administered 2013-04-26: 500 [IU] via INTRAVENOUS
  Filled 2013-04-26: qty 5

## 2013-04-26 MED ORDER — SODIUM CHLORIDE 0.9 % IJ SOLN
10.0000 mL | INTRAMUSCULAR | Status: DC | PRN
  Administered 2013-04-26: 10 mL via INTRAVENOUS
  Filled 2013-04-26: qty 10

## 2013-04-28 ENCOUNTER — Ambulatory Visit (INDEPENDENT_AMBULATORY_CARE_PROVIDER_SITE_OTHER): Admitting: Obstetrics

## 2013-04-28 ENCOUNTER — Encounter: Payer: Self-pay | Admitting: Obstetrics

## 2013-04-28 VITALS — BP 97/67 | HR 76 | Wt 119.0 lb

## 2013-04-28 DIAGNOSIS — Z124 Encounter for screening for malignant neoplasm of cervix: Secondary | ICD-10-CM

## 2013-04-28 DIAGNOSIS — Z113 Encounter for screening for infections with a predominantly sexual mode of transmission: Secondary | ICD-10-CM

## 2013-04-28 DIAGNOSIS — Z0000 Encounter for general adult medical examination without abnormal findings: Secondary | ICD-10-CM

## 2013-04-28 DIAGNOSIS — Z01419 Encounter for gynecological examination (general) (routine) without abnormal findings: Secondary | ICD-10-CM

## 2013-04-28 LAB — POCT URINALYSIS DIPSTICK
BILIRUBIN UA: NEGATIVE
Glucose, UA: NEGATIVE
Ketones, UA: NEGATIVE
Nitrite, UA: NEGATIVE
PH UA: 8
SPEC GRAV UA: 1.015
Urobilinogen, UA: NEGATIVE

## 2013-04-28 NOTE — Progress Notes (Signed)
Subjective:     Rachel [name] is a 62 y.o. female here for a routine exam.  Current complaints: pt would lab work with today's annual exam.  Pt would like to know how her hormone levels are doing along with general health labs. Pt states that she is having some urinary incontinence.  Pt states that she has a bump like area on her vaginal area.  Personal health questionnaire reviewed: yes.   Gynecologic History No LMP recorded. Patient is not currently having periods (Reason: Other). Contraception: none Last Pap: unsure. Results were: normal Last mammogram: 2014. Results were: normal  Obstetric History OB History  No data available     The following portions of the patient's history were reviewed and updated as appropriate: allergies, current medications, past family history, past medical history, past social history, past surgical history and problem list.  Review of Systems Pertinent items are noted in HPI.    Objective:    General appearance: alert and no distress Breasts: normal appearance, no masses or tenderness Abdomen: normal findings: soft, non-tender Pelvic: cervix normal in appearance, external genitalia normal, no adnexal masses or tenderness, no cervical motion tenderness, rectovaginal septum normal, uterus normal size, shape, and consistency and vagina normal without discharge    Assessment:    Healthy female exam.    Plan:    Follow up in: 1 year.

## 2013-04-29 LAB — PAP IG W/ RFLX HPV ASCU

## 2013-04-29 LAB — WET PREP BY MOLECULAR PROBE
CANDIDA SPECIES: NEGATIVE
Gardnerella vaginalis: NEGATIVE
Trichomonas vaginosis: NEGATIVE

## 2013-06-04 ENCOUNTER — Emergency Department (HOSPITAL_COMMUNITY)

## 2013-06-04 ENCOUNTER — Emergency Department (HOSPITAL_COMMUNITY)
Admission: EM | Admit: 2013-06-04 | Discharge: 2013-06-04 | Disposition: A | Discharge status: Home / Self Care | Attending: Emergency Medicine | Admitting: Emergency Medicine

## 2013-06-04 ENCOUNTER — Encounter (HOSPITAL_COMMUNITY): Payer: Self-pay | Admitting: Emergency Medicine

## 2013-06-04 DIAGNOSIS — Z7982 Long term (current) use of aspirin: Secondary | ICD-10-CM | POA: Insufficient documentation

## 2013-06-04 DIAGNOSIS — K5792 Diverticulitis of intestine, part unspecified, without perforation or abscess without bleeding: Secondary | ICD-10-CM

## 2013-06-04 DIAGNOSIS — Z79899 Other long term (current) drug therapy: Secondary | ICD-10-CM | POA: Insufficient documentation

## 2013-06-04 DIAGNOSIS — K5732 Diverticulitis of large intestine without perforation or abscess without bleeding: Secondary | ICD-10-CM | POA: Insufficient documentation

## 2013-06-04 DIAGNOSIS — Z856 Personal history of leukemia: Secondary | ICD-10-CM | POA: Insufficient documentation

## 2013-06-04 LAB — URINALYSIS, ROUTINE W REFLEX MICROSCOPIC
BILIRUBIN URINE: NEGATIVE
Glucose, UA: NEGATIVE mg/dL
Ketones, ur: 15 mg/dL — AB
LEUKOCYTES UA: NEGATIVE
NITRITE: NEGATIVE
PH: 5.5 (ref 5.0–8.0)
Protein, ur: NEGATIVE mg/dL
Urobilinogen, UA: 0.2 mg/dL (ref 0.0–1.0)

## 2013-06-04 LAB — CBC WITH DIFFERENTIAL/PLATELET
BASOS ABS: 0 10*3/uL (ref 0.0–0.1)
Basophils Relative: 0 % (ref 0–1)
EOS ABS: 0 10*3/uL (ref 0.0–0.7)
Eosinophils Relative: 0 % (ref 0–5)
HCT: 36.2 % (ref 36.0–46.0)
Hemoglobin: 11.7 g/dL — ABNORMAL LOW (ref 12.0–15.0)
LYMPHS PCT: 84 % — AB (ref 12–46)
Lymphs Abs: 52.8 10*3/uL — ABNORMAL HIGH (ref 0.7–4.0)
MCH: 29.2 pg (ref 26.0–34.0)
MCHC: 32.3 g/dL (ref 30.0–36.0)
MCV: 90.3 fL (ref 78.0–100.0)
MONOS PCT: 6 % (ref 3–12)
Monocytes Absolute: 3.8 10*3/uL — ABNORMAL HIGH (ref 0.1–1.0)
NEUTROS ABS: 6.3 10*3/uL (ref 1.7–7.7)
NEUTROS PCT: 10 % — AB (ref 43–77)
PLATELETS: 148 10*3/uL — AB (ref 150–400)
RBC: 4.01 MIL/uL (ref 3.87–5.11)
RDW: 12.4 % (ref 11.5–15.5)
WBC: 62.9 10*3/uL — AB (ref 4.0–10.5)

## 2013-06-04 LAB — COMPREHENSIVE METABOLIC PANEL
ALBUMIN: 3.5 g/dL (ref 3.5–5.2)
ALK PHOS: 112 U/L (ref 39–117)
ALT: 22 U/L (ref 0–35)
AST: 19 U/L (ref 0–37)
BUN: 9 mg/dL (ref 6–23)
CO2: 24 mEq/L (ref 19–32)
Calcium: 9 mg/dL (ref 8.4–10.5)
Chloride: 98 mEq/L (ref 96–112)
Creatinine, Ser: 0.61 mg/dL (ref 0.50–1.10)
GFR calc Af Amer: >90 mL/min (ref >90)
GFR calc non Af Amer: >90 mL/min (ref >90)
Glucose, Bld: 84 mg/dL (ref 70–99)
POTASSIUM: 4.1 meq/L (ref 3.7–5.3)
Sodium: 135 mEq/L — ABNORMAL LOW (ref 137–147)
Total Bilirubin: 0.5 mg/dL (ref 0.3–1.2)
Total Protein: 6.6 g/dL (ref 6.0–8.3)

## 2013-06-04 LAB — URINE MICROSCOPIC-ADD ON

## 2013-06-04 LAB — LIPASE, BLOOD: Lipase: 21 U/L (ref 11–59)

## 2013-06-04 MED ORDER — CIPROFLOXACIN HCL 500 MG PO TABS: 500.0000 mg | ORAL_TABLET | Freq: Two times a day (BID) | ORAL | Status: DC

## 2013-06-04 MED ORDER — OXYCODONE-ACETAMINOPHEN 5-325 MG PO TABS: 1.0000 | ORAL_TABLET | ORAL | Status: DC | PRN

## 2013-06-04 MED ORDER — MORPHINE SULFATE 4 MG/ML IJ SOLN
4.0000 mg | Freq: Once | INTRAMUSCULAR | Status: AC
  Administered 2013-06-04: 4 mg via INTRAVENOUS
  Filled 2013-06-04: qty 1

## 2013-06-04 MED ORDER — IOHEXOL 300 MG/ML  SOLN
50.0000 mL | Freq: Once | INTRAMUSCULAR | Status: AC | PRN
  Administered 2013-06-04: 50 mL via ORAL

## 2013-06-04 MED ORDER — ONDANSETRON HCL 4 MG/2ML IJ SOLN
4.0000 mg | Freq: Once | INTRAMUSCULAR | Status: AC
  Administered 2013-06-04: 4 mg via INTRAVENOUS
  Filled 2013-06-04: qty 2

## 2013-06-04 MED ORDER — ONDANSETRON HCL 4 MG PO TABS: 4.0000 mg | ORAL_TABLET | Freq: Four times a day (QID) | ORAL | Status: DC

## 2013-06-04 MED ORDER — SODIUM CHLORIDE 0.9 % IV BOLUS (SEPSIS)
1000.0000 mL | Freq: Once | INTRAVENOUS | Status: AC
  Administered 2013-06-04: 1000 mL via INTRAVENOUS

## 2013-06-04 MED ORDER — IOHEXOL 300 MG/ML  SOLN
100.0000 mL | Freq: Once | INTRAMUSCULAR | Status: AC | PRN
  Administered 2013-06-04: 100 mL via INTRAVENOUS

## 2013-06-04 MED ORDER — CIPROFLOXACIN HCL 500 MG PO TABS
500.0000 mg | ORAL_TABLET | Freq: Once | ORAL | Status: AC
  Administered 2013-06-04: 500 mg via ORAL
  Filled 2013-06-04: qty 1

## 2013-06-04 MED ORDER — HEPARIN SOD (PORK) LOCK FLUSH 100 UNIT/ML IV SOLN
500.0000 [IU] | Freq: Once | INTRAVENOUS | Status: AC
  Administered 2013-06-04: 500 [IU]
  Filled 2013-06-04: qty 5

## 2013-06-04 MED ORDER — METRONIDAZOLE 500 MG PO TABS: 500.0000 mg | ORAL_TABLET | Freq: Two times a day (BID) | ORAL | Status: DC

## 2013-06-04 MED ORDER — METRONIDAZOLE 500 MG PO TABS
500.0000 mg | ORAL_TABLET | Freq: Once | ORAL | Status: AC
  Administered 2013-06-04: 500 mg via ORAL
  Filled 2013-06-04: qty 1

## 2013-06-04 NOTE — ED Notes (Signed)
Off floor for testing 

## 2013-06-04 NOTE — Discharge Instructions (Signed)
Diverticulitis °A diverticulum is a small pouch or sac on the colon. Diverticulosis is the presence of these diverticula on the colon. Diverticulitis is the irritation (inflammation) or infection of diverticula. °CAUSES  °The colon and its diverticula contain bacteria. If food particles block the tiny opening to a diverticulum, the bacteria inside can grow and cause an increase in pressure. This leads to infection and inflammation and is called diverticulitis. °SYMPTOMS  °· Abdominal pain and tenderness. Usually, the pain is located on the left side of your abdomen. However, it could be located elsewhere. °· Fever. °· Bloating. °· Feeling sick to your stomach (nausea). °· Throwing up (vomiting). °· Abnormal stools. °DIAGNOSIS  °Your caregiver will take a history and perform a physical exam. Since many things can cause abdominal pain, other tests may be necessary. Tests may include: °· Blood tests. °· Urine tests. °· X-ray of the abdomen. °· CT scan of the abdomen. °Sometimes, surgery is needed to determine if diverticulitis or other conditions are causing your symptoms. °TREATMENT  °Most of the time, you can be treated without surgery. Treatment includes: °· Resting the bowels by only having liquids for a few days. As you improve, you will need to eat a low-fiber diet. °· Intravenous (IV) fluids if you are losing body fluids (dehydrated). °· Antibiotic medicines that treat infections may be given. °· Pain and nausea medicine, if needed. °· Surgery if the inflamed diverticulum has burst. °HOME CARE INSTRUCTIONS  °· Try a clear liquid diet (broth, tea, or water for as long as directed by your caregiver). You may then gradually begin a low-fiber diet as tolerated.  °A low-fiber diet is a diet with less than 10 grams of fiber. Choose the foods below to reduce fiber in the diet: °· White breads, cereals, rice, and pasta. °· Cooked fruits and vegetables or soft fresh fruits and vegetables without the skin. °· Ground or  well-cooked tender beef, ham, veal, lamb, pork, or poultry. °· Eggs and seafood. °· After your diverticulitis symptoms have improved, your caregiver may put you on a high-fiber diet. A high-fiber diet includes 14 grams of fiber for every 1000 calories consumed. For a standard 2000 calorie diet, you would need 28 grams of fiber. Follow these diet guidelines to help you increase the fiber in your diet. It is important to slowly increase the amount fiber in your diet to avoid gas, constipation, and bloating. °· Choose whole-grain breads, cereals, pasta, and brown rice. °· Choose fresh fruits and vegetables with the skin on. Do not overcook vegetables because the more vegetables are cooked, the more fiber is lost. °· Choose more nuts, seeds, legumes, dried peas, beans, and lentils. °· Look for food products that have greater than 3 grams of fiber per serving on the Nutrition Facts label. °· Take all medicine as directed by your caregiver. °· If your caregiver has given you a follow-up appointment, it is very important that you go. Not going could result in lasting (chronic) or permanent injury, pain, and disability. If there is any problem keeping the appointment, call to reschedule. °SEEK MEDICAL CARE IF:  °· Your pain does not improve. °· You have a hard time advancing your diet beyond clear liquids. °· Your bowel movements do not return to normal. °SEEK IMMEDIATE MEDICAL CARE IF:  °· Your pain becomes worse. °· You have an oral temperature above 102° F (38.9° C), not controlled by medicine. °· You have repeated vomiting. °· You have bloody or black, tarry stools. °·   Symptoms that brought you to your caregiver become worse or are not getting better. °MAKE SURE YOU:  °· Understand these instructions. °· Will watch your condition. °· Will get help right away if you are not doing well or get worse. °Document Released: 11/20/2004 Document Revised: 05/05/2011 Document Reviewed: 03/18/2010 °ExitCare® Patient Information  ©2014 [employer]. ° °

## 2013-06-04 NOTE — ED Provider Notes (Signed)
TIME SEEN: 10:22 AM  CHIEF COMPLAINT: Nausea, vomiting, diarrhea, lower abdominal pain  HPI: Patient is a 63 y.o. F with history of CLL currently not on chemotherapy who presents emergency department with 2 days of nausea, vomiting and diarrhea and lower abdominal cramping. She states she's had chills and sweats. No known fever. No recent sick contacts, travel, and oblique use or hospitalization. She states she did similar symptoms a month ago and her symptoms lasted approximately one week but she was never seen by a physician. She denies bloody stools or melena. She has had some dysuria but no hematuria, frequency or urgency. No vaginal bleeding or discharge. Her last colonoscopy was in 2011 she states it was normal. She denies a history of diverticulosis or diverticulitis but states that her brother and sister have had diverticulitis before.  ROS: See HPI Constitutional: no fever  Eyes: no drainage  ENT: no runny nose   Cardiovascular:  no chest pain  Resp: no SOB  GI: vomiting GU:  dysuria Integumentary: no rash  Allergy: no hives  Musculoskeletal: no leg swelling  Neurological: no slurred speech ROS otherwise negative  PAST MEDICAL HISTORY/PAST SURGICAL HISTORY:  Past Medical History  Diagnosis Date  . Cancer   . Leukemia   . Lower back pain     MEDICATIONS:  Prior to Admission medications   Medication Sig Start Date End Date Taking? Authorizing Provider  aspirin 81 MG tablet Take 81 mg by mouth daily.    Historical Provider, MD  Calcium Citrate (CITRACAL PO) Take 4 tablets by mouth daily.     Historical Provider, MD  Cholecalciferol (VITAMIN D-3 PO) Take 4,000 Int'l Units by mouth.    Historical Provider, MD  lidocaine-prilocaine (EMLA) cream Apply topically as needed. APPLY NICKEL SIZE AMOUNT TO SKIN ON TOP OF PORT- COVER WITH SARAN WRAP AT LEAST 1 HOUR BEFORE ACCESS 02/26/12   Chauncey Cruel, MD  Progesterone Micronized (PROMETRIUM PO) Take 200 mg by mouth. 1st - 12th day  of every other month    Historical Provider, MD  traZODone (DESYREL) 50 MG tablet Take 50 mg by mouth at bedtime.    Historical Provider, MD    ALLERGIES:  Allergies  Allergen Reactions  . Penicillins     "drops my heartbeat" "I just pass out"    SOCIAL HISTORY:  History  Substance Use Topics  . Smoking status: Never Smoker   . Smokeless tobacco: Never Used  . Alcohol Use: No    FAMILY HISTORY: Family History  Problem Relation Age of Onset  . Depression Son     EXAM: BP 102/86  Pulse 75  Temp(Src) 98.1 F (36.7 C) (Oral)  Resp 16  SpO2 100% CONSTITUTIONAL: Alert and oriented and responds appropriately to questions. Well-appearing; well-nourished HEAD: Normocephalic EYES: Conjunctivae clear, PERRL ENT: normal nose; no rhinorrhea; moist mucous membranes; pharynx without lesions noted NECK: Supple, no meningismus, no LAD  CARD: RRR; S1 and S2 appreciated; no murmurs, no clicks, no rubs, no gallops RESP: Normal chest excursion without splinting or tachypnea; breath sounds clear and equal bilaterally; no wheezes, no rhonchi, no rales,  ABD/GI: Normal bowel sounds; non-distended; soft, diffusely tender to palpation across the lower abdomen but is mostly tender in the left lower quadrant with mild voluntary guarding, no rebound, no peritoneal signs, no tenderness at McBurney's point, negative Murphy sign, no peritoneal signs BACK:  The back appears normal and is non-tender to palpation, there is no CVA tenderness EXT: Normal ROM in all joints;  non-tender to palpation; no edema; normal capillary refill; no cyanosis    SKIN: Normal color for age and race; warm NEURO: Moves all extremities equally PSYCH: The patient's mood and manner are appropriate. Grooming and personal hygiene are appropriate.  MEDICAL DECISION MAKING: Patient here with possible diverticulitis versus colitis versus UTI versus gastroenteritis. Will obtain abdominal labs, urine, CT of her abdomen and pelvis.  We'll give IV fluids, Zofran and morphine.  ED PROGRESS: Patient's CT scan shows diverticulitis without signs of perforation or abscess. She also has adenopathy which may be suggestive of recurrence/reactivation of her CLL. Her white blood cell count is 62,000 which is near her baseline. Have discussed with patient admission versus discharge and she states she would like to be discharged home. She is tolerating by mouth without difficulty. Not having bloody stools or melena. Her abdominal exam is nonsurgical. We'll discharge with Percocet, Zofran, Cipro and Flagyl. Discussed return precautions. Patient will need to follow up with her PCP. Patient verbalizes understanding is comfortable with this plan.     [place], DO 06/04/13 1434

## 2013-06-04 NOTE — ED Notes (Signed)
Per pt, states she started having severe abdominal pain since Thursday-states she vomited once on Thurs and a small amount of diarrhea-states chills and sweats at night

## 2013-06-07 ENCOUNTER — Ambulatory Visit (HOSPITAL_BASED_OUTPATIENT_CLINIC_OR_DEPARTMENT_OTHER)

## 2013-06-07 ENCOUNTER — Other Ambulatory Visit (HOSPITAL_BASED_OUTPATIENT_CLINIC_OR_DEPARTMENT_OTHER)

## 2013-06-07 ENCOUNTER — Ambulatory Visit: Admitting: Oncology

## 2013-06-07 VITALS — BP 105/42 | HR 74 | Resp 14

## 2013-06-07 DIAGNOSIS — C9110 Chronic lymphocytic leukemia of B-cell type not having achieved remission: Secondary | ICD-10-CM

## 2013-06-07 DIAGNOSIS — Z95828 Presence of other vascular implants and grafts: Secondary | ICD-10-CM

## 2013-06-07 LAB — CBC WITH DIFFERENTIAL/PLATELET
BASO%: 0.3 % (ref 0.0–2.0)
Basophils Absolute: 0.2 10*3/uL — ABNORMAL HIGH (ref 0.0–0.1)
EOS%: 0.4 % (ref 0.0–7.0)
Eosinophils Absolute: 0.3 10*3/uL (ref 0.0–0.5)
HCT: 36.8 % (ref 34.8–46.6)
HGB: 11.7 g/dL (ref 11.6–15.9)
LYMPH#: 65.5 10*3/uL — AB (ref 0.9–3.3)
LYMPH%: 92 % — ABNORMAL HIGH (ref 14.0–49.7)
MCH: 28.8 pg (ref 25.1–34.0)
MCHC: 31.6 g/dL (ref 31.5–36.0)
MCV: 91 fL (ref 79.5–101.0)
MONO#: 0.2 10*3/uL (ref 0.1–0.9)
MONO%: 0.3 % (ref 0.0–14.0)
NEUT#: 5 10*3/uL (ref 1.5–6.5)
NEUT%: 7 % — ABNORMAL LOW (ref 38.4–76.8)
Platelets: 194 10*3/uL (ref 145–400)
RBC: 4.05 10*6/uL (ref 3.70–5.45)
RDW: 12.1 % (ref 11.2–14.5)
WBC: 71.2 10*3/uL — AB (ref 3.9–10.3)

## 2013-06-07 LAB — TECHNOLOGIST REVIEW

## 2013-06-07 MED ORDER — HEPARIN SOD (PORK) LOCK FLUSH 100 UNIT/ML IV SOLN
500.0000 [IU] | Freq: Once | INTRAVENOUS | Status: AC
  Administered 2013-06-07: 500 [IU] via INTRAVENOUS
  Filled 2013-06-07: qty 5

## 2013-06-07 MED ORDER — SODIUM CHLORIDE 0.9 % IJ SOLN
10.0000 mL | INTRAMUSCULAR | Status: DC | PRN
  Administered 2013-06-07: 10 mL via INTRAVENOUS
  Filled 2013-06-07: qty 10

## 2013-06-07 NOTE — Patient Instructions (Signed)

## 2013-06-15 ENCOUNTER — Encounter: Admitting: Obstetrics

## 2013-06-15 ENCOUNTER — Encounter: Payer: Self-pay | Admitting: Obstetrics

## 2013-06-27 ENCOUNTER — Encounter: Payer: Self-pay | Admitting: Obstetrics

## 2013-06-27 ENCOUNTER — Encounter: Admitting: Obstetrics

## 2013-06-28 ENCOUNTER — Encounter: Admitting: Obstetrics

## 2013-07-19 ENCOUNTER — Ambulatory Visit (HOSPITAL_BASED_OUTPATIENT_CLINIC_OR_DEPARTMENT_OTHER)

## 2013-07-19 ENCOUNTER — Other Ambulatory Visit (HOSPITAL_BASED_OUTPATIENT_CLINIC_OR_DEPARTMENT_OTHER)

## 2013-07-19 VITALS — BP 112/52 | HR 80 | Temp 98.1°F | Resp 16

## 2013-07-19 DIAGNOSIS — Z452 Encounter for adjustment and management of vascular access device: Secondary | ICD-10-CM

## 2013-07-19 DIAGNOSIS — Z95828 Presence of other vascular implants and grafts: Secondary | ICD-10-CM

## 2013-07-19 DIAGNOSIS — C9110 Chronic lymphocytic leukemia of B-cell type not having achieved remission: Secondary | ICD-10-CM

## 2013-07-19 LAB — CBC WITH DIFFERENTIAL/PLATELET
BASO%: 0 % (ref 0.0–2.0)
Basophils Absolute: 0 10*3/uL (ref 0.0–0.1)
EOS ABS: 0.2 10*3/uL (ref 0.0–0.5)
EOS%: 0.1 % (ref 0.0–7.0)
HCT: 39.4 % (ref 34.8–46.6)
HGB: 12 g/dL (ref 11.6–15.9)
LYMPH#: 116.4 10*3/uL — AB (ref 0.9–3.3)
LYMPH%: 92.8 % — ABNORMAL HIGH (ref 14.0–49.7)
MCH: 28.2 pg (ref 25.1–34.0)
MCHC: 30.5 g/dL — ABNORMAL LOW (ref 31.5–36.0)
MCV: 92.5 fL (ref 79.5–101.0)
MONO#: 1.1 10*3/uL — ABNORMAL HIGH (ref 0.1–0.9)
MONO%: 0.9 % (ref 0.0–14.0)
NEUT%: 6.2 % — ABNORMAL LOW (ref 38.4–76.8)
NEUTROS ABS: 7.8 10*3/uL — AB (ref 1.5–6.5)
Platelets: 166 10*3/uL (ref 145–400)
RBC: 4.26 10*6/uL (ref 3.70–5.45)
RDW: 12.7 % (ref 11.2–14.5)
WBC: 125.4 10*3/uL — AB (ref 3.9–10.3)

## 2013-07-19 MED ORDER — SODIUM CHLORIDE 0.9 % IJ SOLN
10.0000 mL | INTRAMUSCULAR | Status: DC | PRN
  Administered 2013-07-19: 10 mL via INTRAVENOUS
  Filled 2013-07-19: qty 10

## 2013-07-19 MED ORDER — HEPARIN SOD (PORK) LOCK FLUSH 100 UNIT/ML IV SOLN
500.0000 [IU] | Freq: Once | INTRAVENOUS | Status: AC
  Administered 2013-07-19: 500 [IU] via INTRAVENOUS
  Filled 2013-07-19: qty 5

## 2013-07-19 NOTE — Patient Instructions (Signed)

## 2013-07-24 ENCOUNTER — Other Ambulatory Visit: Payer: Self-pay | Admitting: Oncology

## 2013-07-24 NOTE — Progress Notes (Unsigned)
Jo's lymphocyte ct is now >100K, however there is no anemia or thrombocytopenia; se sees me 7/7=-- we will discuss cladribine/ ofatumumab vs bendamustine rituximab vs ibrutinib at that visit

## 2013-07-26 ENCOUNTER — Other Ambulatory Visit: Payer: Self-pay | Admitting: *Deleted

## 2013-07-26 MED ORDER — MAGIC MOUTHWASH: 5.0000 mL | Freq: Four times a day (QID) | ORAL | Status: DC | PRN

## 2013-07-26 NOTE — Telephone Encounter (Signed)
Rachel [name] called to this RN to state she was seen at the dentist office last week for routine dental care and now has developed mouth sores.  " I showed the dentist and he said they were ulcers and recommended for me to use listerine twice a day and prescribed me flucinozide ointment "  Per inquiry by this RN- Rachel [name] stated ulcers were not cold sores nor thrush.  " the listerine burns terribly "  This RN informed pt to use MMW per MD - and to continue with ointment.  Of note - Rachel [name] states she had mammogram " and they saw an enlarged lymphnode and I now also have one sticking out of the side of my neck "  " I also want Rachel [name] to know I have been having headaches that start in the back of my neck and go up into my head and they hurt severely that I have to take a pain pill".  This note will be given to MD for review due to recent labs and next scheduled office visit is July 7.

## 2013-08-30 ENCOUNTER — Ambulatory Visit (HOSPITAL_BASED_OUTPATIENT_CLINIC_OR_DEPARTMENT_OTHER)

## 2013-08-30 ENCOUNTER — Ambulatory Visit (HOSPITAL_BASED_OUTPATIENT_CLINIC_OR_DEPARTMENT_OTHER): Admitting: Oncology

## 2013-08-30 ENCOUNTER — Telehealth: Payer: Self-pay | Admitting: Oncology

## 2013-08-30 ENCOUNTER — Other Ambulatory Visit (HOSPITAL_BASED_OUTPATIENT_CLINIC_OR_DEPARTMENT_OTHER)

## 2013-08-30 VITALS — BP 95/66 | HR 73 | Temp 97.6°F | Resp 18 | Ht 58.5 in | Wt 117.2 lb

## 2013-08-30 DIAGNOSIS — C9110 Chronic lymphocytic leukemia of B-cell type not having achieved remission: Secondary | ICD-10-CM

## 2013-08-30 DIAGNOSIS — Z95828 Presence of other vascular implants and grafts: Secondary | ICD-10-CM

## 2013-08-30 DIAGNOSIS — D649 Anemia, unspecified: Secondary | ICD-10-CM

## 2013-08-30 LAB — CBC WITH DIFFERENTIAL/PLATELET
BASO%: 0.3 % (ref 0.0–2.0)
BASOS ABS: 0.5 10*3/uL — AB (ref 0.0–0.1)
EOS ABS: 0.5 10*3/uL (ref 0.0–0.5)
EOS%: 0.3 % (ref 0.0–7.0)
HCT: 34 % — ABNORMAL LOW (ref 34.8–46.6)
HEMOGLOBIN: 11.2 g/dL — AB (ref 11.6–15.9)
LYMPH%: 93.2 % — ABNORMAL HIGH (ref 14.0–49.7)
MCH: 29.8 pg (ref 25.1–34.0)
MCHC: 32.9 g/dL (ref 31.5–36.0)
MCV: 90.5 fL (ref 79.5–101.0)
MONO#: 1.9 10*3/uL — ABNORMAL HIGH (ref 0.1–0.9)
MONO%: 1.2 % (ref 0.0–14.0)
NEUT%: 5 % — ABNORMAL LOW (ref 38.4–76.8)
NEUTROS ABS: 7.7 10*3/uL — AB (ref 1.5–6.5)
PLATELETS: 157 10*3/uL (ref 145–400)
RBC: 3.76 10*6/uL (ref 3.70–5.45)
RDW: 13.3 % (ref 11.2–14.5)
WBC: 155.8 10*3/uL (ref 3.9–10.3)
lymph#: 145.1 10*3/uL — ABNORMAL HIGH (ref 0.9–3.3)

## 2013-08-30 LAB — TECHNOLOGIST REVIEW

## 2013-08-30 MED ORDER — SODIUM CHLORIDE 0.9 % IJ SOLN
10.0000 mL | INTRAMUSCULAR | Status: DC | PRN
  Administered 2013-08-30: 10 mL via INTRAVENOUS
  Filled 2013-08-30: qty 10

## 2013-08-30 MED ORDER — HEPARIN SOD (PORK) LOCK FLUSH 100 UNIT/ML IV SOLN
500.0000 [IU] | Freq: Once | INTRAVENOUS | Status: AC
  Administered 2013-08-30: 500 [IU] via INTRAVENOUS
  Filled 2013-08-30: qty 5

## 2013-08-30 NOTE — Telephone Encounter (Signed)
per pof to sch appt-gave pt copy of sch °

## 2013-08-30 NOTE — Progress Notes (Signed)
ID: Rachel [name]   DOB: [birth year]  MR#: [id number]  IFO#:[id number]  PCP: Osie Bond Md GYN: SU:  OTHER MD: [name] M.D.   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 She has been treated with Rituxan, ofatumumab, and cladribine, with details noted below.  INTERVAL HISTORY: Rachel [name] returns today for followup of her chronic lymphoid leukemia. As far as the chronic lymphocytic leukemia, the interval history is unremarkable. Rachel [name] is having no drenching sweats, unexplained weight loss or unexplained fatigue, adenopathy, or other symptoms that disease extended activity.  REVIEW OF SYSTEMS: Her biggest problem is warts in her hands. She has been treating this one way or another for a body year, with no resolution. She has seen 2 different dermatologists again without a conclusion. Aside from the warts, she has some heartburn, rare problems with diarrhea, feels a little forgetful and anxious, but rarely depressed. Hot flashes are mild. A detailed review of systems was otherwise noncontributory  PAST MEDICAL HISTORY: Past Medical History  Diagnosis Date  . Cancer   . Leukemia   . Lower back pain     PAST SURGICAL HISTORY: Past Surgical History  Procedure Laterality Date  . Tonsillectomy    . Other surgical history      removal of uterine lining    FAMILY HISTORY Family History  Problem Relation Age of Onset  . Depression Son     GYNECOLOGIC HISTORY:  SOCIAL HISTORY: [name] is retired from [employer]. Her son [name] lives in [place] and has 3 sons; he works for a Pharmacist, community. A second son lives in [place] and has two children. Son [name] lives in [place] and has a daughter, as well as 2 sons by marriage.   ADVANCED DIRECTIVES:  HEALTH MAINTENANCE: History  Substance Use Topics  . Smoking status: Never Smoker   . Smokeless tobacco: Never  Used  . Alcohol Use: No     Colonoscopy: due  PAP: December 2012/ [name]  Bone density: Due  Lipid panel: per Dr Drema Dallas  Allergies  Allergen Reactions  . Penicillins     "drops my heartbeat" "I just pass out"    Current Outpatient Prescriptions  Medication Sig Dispense Refill  . acetaminophen (TYLENOL) 500 MG tablet Take 500 mg by mouth every 6 (six) hours as needed for headache.      . Alum & Mag Hydroxide-Simeth (MAGIC MOUTHWASH) SOLN Take 5 mLs by mouth 4 (four) times daily as needed for mouth pain (swish -spit or swallow).  240 mL  1  . aspirin 81 MG tablet Take 81 mg by mouth daily.      . Calcium Citrate (CITRACAL PO) Take 4 tablets by mouth daily.       . cholecalciferol (VITAMIN D) 1000 UNITS tablet Take 4,000 Units by mouth daily.      . ciprofloxacin (CIPRO) 500 MG tablet Take 1 tablet (500 mg total) by mouth 2 (two) times daily.  20 tablet  0  . Estradiol (ELESTRIN) 0.52 MG/0.87 GM (0.06%) GEL Apply 2 application topically See admin instructions. 2 pumps thinly to upper arm daily at bedtime      . lidocaine-prilocaine (EMLA) cream Apply 1 application topically as needed (for port). Apply nickel size amount to skin on top of port. Cover with saran  Wrap at least one hour prior to access.      . metroNIDAZOLE (FLAGYL) 500 MG tablet Take 1 tablet (  500 mg total) by mouth 2 (two) times daily. Do not drink alcohol with this medication.  20 tablet  0  . ondansetron (ZOFRAN) 4 MG tablet Take 1 tablet (4 mg total) by mouth every 6 (six) hours.  15 tablet  0  . oxyCODONE-acetaminophen (PERCOCET/ROXICET) 5-325 MG per tablet Take 1 tablet by mouth every 4 (four) hours as needed.  25 tablet  0  . progesterone (PROMETRIUM) 200 MG capsule Take 200 mg by mouth See admin instructions. on days 1-12 every other month      . traZODone (DESYREL) 50 MG tablet Take 50 mg by mouth at bedtime.       No current facility-administered medications for this visit.    OBJECTIVE: Middle-aged white woman  who appears stated age 63 Vitals:   08/30/13 1430  BP: 95/66  Pulse: 73  Temp: 97.6 F (36.4 C)  Resp: 18   Filed Weights   08/30/13 1430  Weight: 117 lb 3.2 oz (53.162 kg)   Body mass index is 24.07 kg/(m^2).    ECOG FS: 1  Sclerae unicteric, pupils equal and round Oropharynx no thrush or other lesions No cervical, supraclavicular, or axillary adenopathy; no inguinal adenopathy Lungs clear to auscultation bilaterally Heart regular rate and rhythm Abd benign, no splenomegaly, soft, nontender, positive bowel sounds MSK no focal spinal tenderness, no joint edema Neuro: nonfocal; well oriented; pleasant affect Breast exam: deferred Port is intact in the right upper chest wall   LAB RESULTS:  Lab Results  Component Value Date   WBC 155.8* 08/30/2013   NEUTROABS 7.7* 08/30/2013   HGB 11.2* 08/30/2013   HCT 34.0* 08/30/2013   MCV 90.5 08/30/2013   PLT 157 08/30/2013      Chemistry      Component Value Date/Time   NA 135* 06/04/2013 1130   NA 138 12/15/2011 1322   K 4.1 06/04/2013 1130   K 3.7 12/15/2011 1322   CL 98 06/04/2013 1130   CL 101 12/15/2011 1322   CO2 24 06/04/2013 1130   CO2 26 12/15/2011 1322   BUN 9 06/04/2013 1130   BUN 14.0 12/15/2011 1322   CREATININE 0.61 06/04/2013 1130   CREATININE 1.1 12/15/2011 1322      Component Value Date/Time   CALCIUM 9.0 06/04/2013 1130   CALCIUM 9.9 12/15/2011 1322   ALKPHOS 112 06/04/2013 1130   ALKPHOS 104 12/15/2011 1322   AST 19 06/04/2013 1130   AST 22 12/15/2011 1322   ALT 22 06/04/2013 1130   ALT 21 12/15/2011 1322   BILITOT 0.5 06/04/2013 1130   BILITOT 0.30 12/15/2011 1322     Results for ANALEI, WHINERY A (MRN [id number]) as of 08/30/2013 20:28  Ref. Range 03/15/2013 13:10 04/26/2013 13:41 06/07/2013 14:18 07/19/2013 13:55 08/30/2013 13:56  lymph# Latest Range: 0.9-3.3 10e3/uL 53.7 (H) 63.8 (H) 65.5 (H) 116.4 (H) 145.1 (H)    STUDIES: No results found.  ASSESSMENT: 63 y.o.  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 I am ready to start treatment if she is agreeable, but she is planning to be gone to  [facility] and other areas between August 13 and October 12. She is completely asymptomatic as far as her leukemia is concerned. Accordingly the plan is going to be for her to see me shortly after she returns in October and likely start treatment at that time with bendamustine and right toxin (we did talk about ibrutinib today but she tells me "I can't take a pill regularly").  We just flushed report, and I have arranged for her to have it flushed again just before she leaves on the August trip. Otherwise she knows to call for any fevers, rash, bleeding, or significant adenopathy, or other "B." symptoms that may develop.  Incidentally she returned many of her skin medications today in case we can use them. I am passing the month of pharmacy. Chauncey Cruel    08/30/2013

## 2013-08-30 NOTE — Patient Instructions (Signed)

## 2013-08-31 NOTE — Addendum Note (Signed)
Addended by: Laureen Abrahams on: 08/31/2013 04:46 PM   Modules accepted: Orders

## 2013-10-04 ENCOUNTER — Other Ambulatory Visit: Payer: Self-pay | Admitting: *Deleted

## 2013-10-04 ENCOUNTER — Encounter: Payer: Self-pay | Admitting: *Deleted

## 2013-10-04 ENCOUNTER — Encounter: Payer: Self-pay | Admitting: Oncology

## 2013-10-04 MED ORDER — SCOPOLAMINE 1 MG/3DAYS TD PT72: 1.0000 | MEDICATED_PATCH | TRANSDERMAL | Status: DC

## 2013-10-04 NOTE — Progress Notes (Signed)
RECEIVED A FAX FROM [facility] CONCERNING A PRIOR AUTHORIZATION FOR TRANSDERM-SCOP. THIS REQUEST WAS PLACED IN THE MANAGED CARE BIN.

## 2013-10-04 NOTE — Telephone Encounter (Signed)
Pt called to this RN to state she will be going [place] on [place] " and I was hoping to get something for sea sickness "  This RN discussed above with pt and prescription for scopolamine called to pharmacy as well as this RN discussed use of dramamine.

## 2013-10-04 NOTE — Progress Notes (Signed)
Faxed pa form for [facility]

## 2013-10-05 ENCOUNTER — Ambulatory Visit (HOSPITAL_BASED_OUTPATIENT_CLINIC_OR_DEPARTMENT_OTHER)

## 2013-10-05 VITALS — BP 103/40 | HR 68 | Temp 98.4°F

## 2013-10-05 DIAGNOSIS — Z95828 Presence of other vascular implants and grafts: Secondary | ICD-10-CM

## 2013-10-05 DIAGNOSIS — C9110 Chronic lymphocytic leukemia of B-cell type not having achieved remission: Secondary | ICD-10-CM

## 2013-10-05 DIAGNOSIS — Z452 Encounter for adjustment and management of vascular access device: Secondary | ICD-10-CM

## 2013-10-05 MED ORDER — SODIUM CHLORIDE 0.9 % IJ SOLN
10.0000 mL | INTRAMUSCULAR | Status: DC | PRN
  Administered 2013-10-05: 10 mL via INTRAVENOUS
  Filled 2013-10-05: qty 10

## 2013-10-05 MED ORDER — HEPARIN SOD (PORK) LOCK FLUSH 100 UNIT/ML IV SOLN
500.0000 [IU] | Freq: Once | INTRAVENOUS | Status: AC
  Administered 2013-10-05: 500 [IU] via INTRAVENOUS
  Filled 2013-10-05: qty 5

## 2013-10-05 NOTE — Patient Instructions (Signed)

## 2013-12-05 ENCOUNTER — Ambulatory Visit (HOSPITAL_BASED_OUTPATIENT_CLINIC_OR_DEPARTMENT_OTHER): Payer: Medicare Other | Admitting: Oncology

## 2013-12-05 ENCOUNTER — Ambulatory Visit (HOSPITAL_BASED_OUTPATIENT_CLINIC_OR_DEPARTMENT_OTHER): Payer: Medicare Other

## 2013-12-05 ENCOUNTER — Telehealth: Payer: Self-pay | Admitting: Oncology

## 2013-12-05 ENCOUNTER — Telehealth: Payer: Self-pay | Admitting: *Deleted

## 2013-12-05 ENCOUNTER — Other Ambulatory Visit (HOSPITAL_BASED_OUTPATIENT_CLINIC_OR_DEPARTMENT_OTHER): Payer: Medicare Other

## 2013-12-05 ENCOUNTER — Encounter

## 2013-12-05 VITALS — BP 112/50 | HR 67 | Temp 98.4°F | Resp 20 | Ht 58.5 in | Wt 118.3 lb

## 2013-12-05 DIAGNOSIS — C9110 Chronic lymphocytic leukemia of B-cell type not having achieved remission: Secondary | ICD-10-CM

## 2013-12-05 DIAGNOSIS — Z95828 Presence of other vascular implants and grafts: Secondary | ICD-10-CM

## 2013-12-05 LAB — CBC WITH DIFFERENTIAL/PLATELET
BASO%: 0.2 % (ref 0.0–2.0)
Basophils Absolute: 0.6 10*3/uL — ABNORMAL HIGH (ref 0.0–0.1)
EOS%: 0.3 % (ref 0.0–7.0)
Eosinophils Absolute: 0.8 10*3/uL — ABNORMAL HIGH (ref 0.0–0.5)
HCT: 33.8 % — ABNORMAL LOW (ref 34.8–46.6)
HGB: 10.8 g/dL — ABNORMAL LOW (ref 11.6–15.9)
LYMPH%: 95.2 % — AB (ref 14.0–49.7)
MCH: 29.1 pg (ref 25.1–34.0)
MCHC: 31.8 g/dL (ref 31.5–36.0)
MCV: 91.4 fL (ref 79.5–101.0)
MONO#: 2.7 10*3/uL — ABNORMAL HIGH (ref 0.1–0.9)
MONO%: 1.1 % (ref 0.0–14.0)
NEUT#: 8.2 10*3/uL — ABNORMAL HIGH (ref 1.5–6.5)
NEUT%: 3.2 % — ABNORMAL LOW (ref 38.4–76.8)
PLATELETS: 187 10*3/uL (ref 145–400)
RBC: 3.7 10*6/uL (ref 3.70–5.45)
RDW: 13.9 % (ref 11.2–14.5)
WBC: 256 10*3/uL (ref 3.9–10.3)
lymph#: 243.8 10*3/uL — ABNORMAL HIGH (ref 0.9–3.3)

## 2013-12-05 LAB — TECHNOLOGIST REVIEW

## 2013-12-05 MED ORDER — ONDANSETRON HCL 8 MG PO TABS
8.0000 mg | ORAL_TABLET | Freq: Two times a day (BID) | ORAL | Status: DC
Start: 2013-12-05 — End: 2013-12-11

## 2013-12-05 MED ORDER — ALLOPURINOL 300 MG PO TABS: 300.0000 mg | ORAL_TABLET | Freq: Every day | ORAL | Status: DC

## 2013-12-05 MED ORDER — ACYCLOVIR 400 MG PO TABS: 400.0000 mg | ORAL_TABLET | Freq: Every day | ORAL | Status: DC

## 2013-12-05 MED ORDER — LORAZEPAM 0.5 MG PO TABS: 0.5000 mg | ORAL_TABLET | Freq: Every evening | ORAL | Status: DC | PRN

## 2013-12-05 MED ORDER — DEXAMETHASONE 4 MG PO TABS: 8.0000 mg | ORAL_TABLET | Freq: Two times a day (BID) | ORAL | Status: DC

## 2013-12-05 MED ORDER — HEPARIN SOD (PORK) LOCK FLUSH 100 UNIT/ML IV SOLN
500.0000 [IU] | Freq: Once | INTRAVENOUS | Status: AC
  Administered 2013-12-05: 500 [IU] via INTRAVENOUS
  Filled 2013-12-05: qty 5

## 2013-12-05 MED ORDER — PROCHLORPERAZINE MALEATE 10 MG PO TABS
10.0000 mg | ORAL_TABLET | Freq: Four times a day (QID) | ORAL | Status: DC | PRN
Start: 2013-12-05 — End: 2013-12-11

## 2013-12-05 MED ORDER — SODIUM CHLORIDE 0.9 % IJ SOLN
10.0000 mL | INTRAMUSCULAR | Status: DC | PRN
  Administered 2013-12-05: 10 mL via INTRAVENOUS
  Filled 2013-12-05: qty 10

## 2013-12-05 NOTE — Telephone Encounter (Signed)
per pof to sch pt appt-sent MW email to sch trmt-pt req to mail copy of sch

## 2013-12-05 NOTE — Patient Instructions (Signed)

## 2013-12-05 NOTE — Progress Notes (Signed)
ID: Jamaia Brum Morreale   DOB: [birth year]  MR#: [id number]  RCB#:[id number]  PCP: Osie Bond Md GYN: SU:  OTHER MD: [name] M.D.   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 She has been treated with Rituxan, ofatumumab, and cladribine, with details noted below.  INTERVAL HISTORY: [name] returns today for followup of her chronic lymphoid leukemia. After her last visit here she went to [place] with family, to [place] with friends, and the [place]. She was accompanied by her husband and all these trips. Nevertheless she tells me that he has offered her a separation and that she has accepted. However nothing has been utilized as yet and they continue to live in the same house.  REVIEW OF SYSTEMS: She is having no fevers, drenching sweats, unexplained fatigue or unexplained weight loss. She has not noticed any adenopathy. There have been no unusual infections. She does have some mild sinus issues, some heartburn, perhaps once a month she has loose bowel movements which should correct with Imodium, she has stress urinary incontinence, she bruises easily, she feels forgetful and anxious as well as depressed, and she has some hot flashes especially at night. She continues to have some tingling in her fingers. None of these symptoms are new and none of them are more persistent or intense than prior  PAST MEDICAL HISTORY: Past Medical History  Diagnosis Date  . Cancer   . Leukemia   . Lower back pain     PAST SURGICAL HISTORY: Past Surgical History  Procedure Laterality Date  . Tonsillectomy    . Other surgical history      removal of uterine lining    FAMILY HISTORY Family History  Problem Relation Age of Onset  . Depression Son     GYNECOLOGIC HISTORY:  SOCIAL HISTORY: [name] is retired from [employer]. Her son [name] lives in [place] and has  3 sons; he works for a Pharmacist, community. A second son lives in [place] and has two children. Son [name] lives in [place] and has a daughter, as well as 2 sons by marriage.   ADVANCED DIRECTIVES:  HEALTH MAINTENANCE: History  Substance Use Topics  . Smoking status: Never Smoker   . Smokeless tobacco: Never Used  . Alcohol Use: No     Colonoscopy: due  PAP: December 2012/ [name]  Bone density: Due  Lipid panel: per Dr Drema Dallas  Allergies  Allergen Reactions  . Penicillins     "drops my heartbeat" "I just pass out"    Current Outpatient Prescriptions  Medication Sig Dispense Refill  . acetaminophen (TYLENOL) 500 MG tablet Take 500 mg by mouth every 6 (six) hours as needed for headache.      Marland Kitchen aspirin 81 MG tablet Take 81 mg by mouth daily.      . Calcium Citrate (CITRACAL PO) Take 4 tablets by mouth daily.       . cholecalciferol (VITAMIN D) 1000 UNITS tablet Take 4,000 Units by mouth daily.      . Estradiol (ELESTRIN) 0.52 MG/0.87 GM (0.06%) GEL Apply 2 application topically See admin instructions. 2 pumps thinly to upper arm daily at bedtime      . lidocaine-prilocaine (EMLA) cream Apply 1 application topically as needed (for port). Apply nickel size amount to skin on top of port. Cover with saran  Wrap at least one hour prior to access.      Marland Kitchen  progesterone (PROMETRIUM) 200 MG capsule Take 200 mg by mouth See admin instructions. on days 1-12 every other month      . scopolamine (TRANSDERM-SCOP) 1 MG/3DAYS Place 1 patch (1.5 mg total) onto the skin every 3 (three) days.  4 patch  0  . traZODone (DESYREL) 50 MG tablet Take 50 mg by mouth at bedtime.       No current facility-administered medications for this visit.    OBJECTIVE: Middle-aged white woman in no acute distress Filed Vitals:   12/05/13 1148  BP: 112/50  Pulse: 67  Temp: 98.4 F (36.9 C)  Resp: 20   Filed Weights   12/05/13 1148  Weight: 118 lb 4.8 oz (53.661 kg)   Body mass index is 24.3 kg/(m^2).    ECOG FS:  1  Sclerae unicteric, EOMs intact Oropharynx clear, teeth in good repair s No cervical, supraclavicular, or axillary adenopathy Lungs clear to auscultation bilaterally Heart regular rate and rhythm Abd benign, soft, nontender, positive bowel sounds MSK no focal spinal tenderness, no joint edema Neuro: nonfocal; well oriented; pleasant affect Breast exam: deferred Port is intact in the right upper chest wall   LAB RESULTS:  Lab Results  Component Value Date   WBC 256.0* 12/05/2013   NEUTROABS 8.2* 12/05/2013   HGB 10.8* 12/05/2013   HCT 33.8* 12/05/2013   MCV 91.4 12/05/2013   PLT 187 12/05/2013      Chemistry      Component Value Date/Time   NA 135* 06/04/2013 1130   NA 138 12/15/2011 1322   K 4.1 06/04/2013 1130   K 3.7 12/15/2011 1322   CL 98 06/04/2013 1130   CL 101 12/15/2011 1322   CO2 24 06/04/2013 1130   CO2 26 12/15/2011 1322   BUN 9 06/04/2013 1130   BUN 14.0 12/15/2011 1322   CREATININE 0.61 06/04/2013 1130   CREATININE 1.1 12/15/2011 1322      Component Value Date/Time   CALCIUM 9.0 06/04/2013 1130   CALCIUM 9.9 12/15/2011 1322   ALKPHOS 112 06/04/2013 1130   ALKPHOS 104 12/15/2011 1322   AST 19 06/04/2013 1130   AST 22 12/15/2011 1322   ALT 22 06/04/2013 1130   ALT 21 12/15/2011 1322   BILITOT 0.5 06/04/2013 1130   BILITOT 0.30 12/15/2011 1322       STUDIES: No results found.  ASSESSMENT: 63 y.o.  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 There is a clear indication for treatment in that she has moderate anemia due to her chronic lymphoid leukemia. The longer we weighed the more "in the hole" she is going to go. Accordingly now that she is alert trips behind her we are ready to get started.  She would prefer to get treated on a Monday and Tuesday to minimize the effect on her training, which I think is a good concern. Day 1 will be October 19. We went over the possible toxicities, side effects and complications of bendamustine, which she has not taken previously. The  rituximab of course she is more familiar with. We also reviewed its possible complications and side effects.  I wrote her the prescription for her nausea medicines and also discussed how to take them.  The plan will be to do at least 4 cycles of bendamustine/rituximab and then consider whether to go for 2 additional cycles depending on initial response.  Blaise has a good understanding of the overall plan. If she agrees with it. She knows the goal of treatment in her case is control. She will call with any problems that may develop before her next visit here. :Chauncey Cruel    12/05/2013

## 2013-12-05 NOTE — Telephone Encounter (Signed)
Per desk RN I have adjusted 10/15 appt. Desk RN to call the patient

## 2013-12-06 DIAGNOSIS — Z780 Asymptomatic menopausal state: Secondary | ICD-10-CM | POA: Diagnosis not present

## 2013-12-06 DIAGNOSIS — C9110 Chronic lymphocytic leukemia of B-cell type not having achieved remission: Secondary | ICD-10-CM | POA: Diagnosis not present

## 2013-12-06 DIAGNOSIS — Z0000 Encounter for general adult medical examination without abnormal findings: Secondary | ICD-10-CM | POA: Diagnosis not present

## 2013-12-06 DIAGNOSIS — Z136 Encounter for screening for cardiovascular disorders: Secondary | ICD-10-CM | POA: Diagnosis not present

## 2013-12-06 DIAGNOSIS — D630 Anemia in neoplastic disease: Secondary | ICD-10-CM | POA: Diagnosis not present

## 2013-12-06 DIAGNOSIS — N951 Menopausal and female climacteric states: Secondary | ICD-10-CM | POA: Diagnosis not present

## 2013-12-06 DIAGNOSIS — J309 Allergic rhinitis, unspecified: Secondary | ICD-10-CM | POA: Diagnosis not present

## 2013-12-06 DIAGNOSIS — Z1389 Encounter for screening for other disorder: Secondary | ICD-10-CM | POA: Diagnosis not present

## 2013-12-06 DIAGNOSIS — F3341 Major depressive disorder, recurrent, in partial remission: Secondary | ICD-10-CM | POA: Diagnosis not present

## 2013-12-07 NOTE — Addendum Note (Signed)
Addended by: Jaci Carrel A on: 12/07/2013 10:41 AM   Modules accepted: Orders

## 2013-12-08 ENCOUNTER — Other Ambulatory Visit: Payer: Self-pay | Admitting: *Deleted

## 2013-12-11 ENCOUNTER — Other Ambulatory Visit: Payer: Self-pay | Admitting: Oncology

## 2013-12-12 ENCOUNTER — Ambulatory Visit (HOSPITAL_BASED_OUTPATIENT_CLINIC_OR_DEPARTMENT_OTHER): Payer: Medicare Other

## 2013-12-12 VITALS — BP 90/43 | HR 81 | Temp 98.6°F | Resp 18

## 2013-12-12 DIAGNOSIS — Z5112 Encounter for antineoplastic immunotherapy: Secondary | ICD-10-CM

## 2013-12-12 DIAGNOSIS — Z5111 Encounter for antineoplastic chemotherapy: Secondary | ICD-10-CM | POA: Diagnosis not present

## 2013-12-12 DIAGNOSIS — C9110 Chronic lymphocytic leukemia of B-cell type not having achieved remission: Secondary | ICD-10-CM | POA: Diagnosis not present

## 2013-12-12 MED ORDER — SODIUM CHLORIDE 0.9 % IV SOLN
Freq: Once | INTRAVENOUS | Status: AC
  Administered 2013-12-12: 09:00:00 via INTRAVENOUS

## 2013-12-12 MED ORDER — SODIUM CHLORIDE 0.9 % IV SOLN
90.0000 mg/m2 | Freq: Once | INTRAVENOUS | Status: AC
  Administered 2013-12-12: 135 mg via INTRAVENOUS
  Filled 2013-12-12: qty 1.5

## 2013-12-12 MED ORDER — ONDANSETRON 8 MG/NS 50 ML IVPB
INTRAVENOUS | Status: AC
  Filled 2013-12-12: qty 8

## 2013-12-12 MED ORDER — SODIUM CHLORIDE 0.9 % IJ SOLN
10.0000 mL | INTRAMUSCULAR | Status: DC | PRN
  Administered 2013-12-12: 10 mL
  Filled 2013-12-12: qty 10

## 2013-12-12 MED ORDER — DEXAMETHASONE SODIUM PHOSPHATE 10 MG/ML IJ SOLN
INTRAMUSCULAR | Status: AC
  Filled 2013-12-12: qty 1

## 2013-12-12 MED ORDER — DIPHENHYDRAMINE HCL 25 MG PO CAPS
25.0000 mg | ORAL_CAPSULE | Freq: Once | ORAL | Status: AC
  Administered 2013-12-12: 25 mg via ORAL

## 2013-12-12 MED ORDER — ACETAMINOPHEN 325 MG PO TABS
ORAL_TABLET | ORAL | Status: AC
  Filled 2013-12-12: qty 2

## 2013-12-12 MED ORDER — HEPARIN SOD (PORK) LOCK FLUSH 100 UNIT/ML IV SOLN
500.0000 [IU] | Freq: Once | INTRAVENOUS | Status: AC | PRN
  Administered 2013-12-12: 500 [IU]
  Filled 2013-12-12: qty 5

## 2013-12-12 MED ORDER — DIPHENHYDRAMINE HCL 25 MG PO CAPS
ORAL_CAPSULE | ORAL | Status: AC
  Filled 2013-12-12: qty 1

## 2013-12-12 MED ORDER — DEXAMETHASONE SODIUM PHOSPHATE 10 MG/ML IJ SOLN
10.0000 mg | Freq: Once | INTRAMUSCULAR | Status: AC
  Administered 2013-12-12: 10 mg via INTRAVENOUS

## 2013-12-12 MED ORDER — ACETAMINOPHEN 325 MG PO TABS
650.0000 mg | ORAL_TABLET | Freq: Once | ORAL | Status: AC
  Administered 2013-12-12: 650 mg via ORAL

## 2013-12-12 MED ORDER — SODIUM CHLORIDE 0.9 % IV SOLN
375.0000 mg/m2 | Freq: Once | INTRAVENOUS | Status: AC
  Administered 2013-12-12: 600 mg via INTRAVENOUS
  Filled 2013-12-12: qty 60

## 2013-12-12 MED ORDER — ONDANSETRON 8 MG/50ML IVPB (CHCC)
8.0000 mg | Freq: Once | INTRAVENOUS | Status: AC
  Administered 2013-12-12: 8 mg via INTRAVENOUS

## 2013-12-12 NOTE — Progress Notes (Signed)
Per Dr. Jana Hakim okay to treat with CBC from 10/12, no need for more labs today.  0915: Pt states she has received IV fluids in the past prior to her treatments. Dr. Jana Hakim notified and order received for 500 ml of NS.   1015: Dr. Jana Hakim notified of pt's BP of 88/51. Okay to proceed with Rituxan treatment per MD.  Will continue to monitor patient closely.

## 2013-12-12 NOTE — Patient Instructions (Signed)
[facility] Discharge Instructions for Patients Receiving Chemotherapy  Today you received the following chemotherapy agents Rituxan and Treanda. To help prevent nausea and vomiting after your treatment, we encourage you to take your nausea medication as prescribed.   If you develop nausea and vomiting that is not controlled by your nausea medication, call the clinic.   BELOW ARE SYMPTOMS THAT SHOULD BE REPORTED IMMEDIATELY:  *FEVER GREATER THAN 100.5 F  *CHILLS WITH OR WITHOUT FEVER  NAUSEA AND VOMITING THAT IS NOT CONTROLLED WITH YOUR NAUSEA MEDICATION  *UNUSUAL SHORTNESS OF BREATH  *UNUSUAL BRUISING OR BLEEDING  TENDERNESS IN MOUTH AND THROAT WITH OR WITHOUT PRESENCE OF ULCERS  *URINARY PROBLEMS  *BOWEL PROBLEMS  UNUSUAL RASH Items with * indicate a potential emergency and should be followed up as soon as possible.  Feel free to call the clinic you have any questions or concerns. The clinic phone number is [phone].    

## 2013-12-13 ENCOUNTER — Ambulatory Visit (HOSPITAL_BASED_OUTPATIENT_CLINIC_OR_DEPARTMENT_OTHER): Payer: Medicare Other

## 2013-12-13 VITALS — BP 140/99 | HR 65 | Temp 98.2°F | Resp 18

## 2013-12-13 DIAGNOSIS — Z5111 Encounter for antineoplastic chemotherapy: Secondary | ICD-10-CM

## 2013-12-13 DIAGNOSIS — C9110 Chronic lymphocytic leukemia of B-cell type not having achieved remission: Secondary | ICD-10-CM | POA: Diagnosis not present

## 2013-12-13 MED ORDER — DEXAMETHASONE SODIUM PHOSPHATE 10 MG/ML IJ SOLN
INTRAMUSCULAR | Status: AC
  Filled 2013-12-13: qty 1

## 2013-12-13 MED ORDER — SODIUM CHLORIDE 0.9 % IV SOLN
90.0000 mg/m2 | Freq: Once | INTRAVENOUS | Status: AC
  Administered 2013-12-13: 135 mg via INTRAVENOUS
  Filled 2013-12-13: qty 1.5

## 2013-12-13 MED ORDER — ONDANSETRON 8 MG/50ML IVPB (CHCC)
8.0000 mg | Freq: Once | INTRAVENOUS | Status: AC
  Administered 2013-12-13: 8 mg via INTRAVENOUS

## 2013-12-13 MED ORDER — ONDANSETRON 8 MG/NS 50 ML IVPB
INTRAVENOUS | Status: AC
  Filled 2013-12-13: qty 8

## 2013-12-13 MED ORDER — HEPARIN SOD (PORK) LOCK FLUSH 100 UNIT/ML IV SOLN
500.0000 [IU] | Freq: Once | INTRAVENOUS | Status: AC | PRN
  Administered 2013-12-13: 500 [IU]
  Filled 2013-12-13: qty 5

## 2013-12-13 MED ORDER — DEXAMETHASONE SODIUM PHOSPHATE 10 MG/ML IJ SOLN
10.0000 mg | Freq: Once | INTRAMUSCULAR | Status: AC
  Administered 2013-12-13: 10 mg via INTRAVENOUS

## 2013-12-13 MED ORDER — SODIUM CHLORIDE 0.9 % IV SOLN
Freq: Once | INTRAVENOUS | Status: AC
  Administered 2013-12-13: 12:00:00 via INTRAVENOUS

## 2013-12-13 MED ORDER — SODIUM CHLORIDE 0.9 % IJ SOLN
10.0000 mL | INTRAMUSCULAR | Status: DC | PRN
  Administered 2013-12-13: 10 mL
  Filled 2013-12-13: qty 10

## 2013-12-13 NOTE — Patient Instructions (Signed)
[facility] Discharge Instructions for Patients Receiving Chemotherapy  Today you received the following chemotherapy agents: Treanda.  To help prevent nausea and vomiting after your treatment, we encourage you to take your nausea medication as prescribed.   If you develop nausea and vomiting that is not controlled by your nausea medication, call the clinic.   BELOW ARE SYMPTOMS THAT SHOULD BE REPORTED IMMEDIATELY:  *FEVER GREATER THAN 100.5 F  *CHILLS WITH OR WITHOUT FEVER  NAUSEA AND VOMITING THAT IS NOT CONTROLLED WITH YOUR NAUSEA MEDICATION  *UNUSUAL SHORTNESS OF BREATH  *UNUSUAL BRUISING OR BLEEDING  TENDERNESS IN MOUTH AND THROAT WITH OR WITHOUT PRESENCE OF ULCERS  *URINARY PROBLEMS  *BOWEL PROBLEMS  UNUSUAL RASH Items with * indicate a potential emergency and should be followed up as soon as possible.  Feel free to call the clinic you have any questions or concerns. The clinic phone number is [phone].    

## 2013-12-14 ENCOUNTER — Telehealth: Payer: Self-pay | Admitting: *Deleted

## 2013-12-14 NOTE — Telephone Encounter (Signed)
Called Rachel [name] for chemotherapy F/U.  Patient is doing fairly well.  Reports "Problem with my stomach.  I took an anti-emetic.  Denies n/v.  My bowels are not moving like they usually do.  Before starting chemotherapy I have five to six bm's per day.  One day I had twelve BM's."  My bowels moved only once today at 1000 am and I feel the urge to go.  Abdomen is slightly swollen today, both ankles with swelling.  My socks leave indentations."  Denies any other side effects or symptoms.  Bladder is functioning well.  "Eating small amounts because I am not hungry."  Denies drinking well.  Instructed to drink 64 oz minimum daily or at least the day before, of and after treatment.  Went to [facility] today and did not do as well as normal.  Everyone said my cheeks/face looks swollen.    Discussed importance of elevating feet and legs, drinking water and signs and symptoms to call to report.  Denies questions at this time and encouraged to call if needed.  Reviewed how to call after hours in the case of an emergency.  Next scheduled f/u is 12-26-2013 with APP.

## 2013-12-14 NOTE — Telephone Encounter (Signed)
Message copied by Cherylynn Ridges on Wed Dec 14, 2013  1:40 PM ------      Message from: Oliver Hum      Created: Mon Dec 12, 2013 12:02 PM      Regarding: Chemo Follow up Call       First time Rituxan/Treanda.  Had previously had Rituxan over two years ago. Dr. Jana Hakim. ------

## 2013-12-21 DIAGNOSIS — Z780 Asymptomatic menopausal state: Secondary | ICD-10-CM | POA: Diagnosis not present

## 2013-12-23 ENCOUNTER — Other Ambulatory Visit: Payer: Self-pay | Admitting: *Deleted

## 2013-12-23 DIAGNOSIS — C9110 Chronic lymphocytic leukemia of B-cell type not having achieved remission: Secondary | ICD-10-CM

## 2013-12-26 ENCOUNTER — Ambulatory Visit: Payer: Medicare Other | Admitting: Nurse Practitioner

## 2013-12-26 ENCOUNTER — Other Ambulatory Visit: Payer: Medicare Other

## 2013-12-26 ENCOUNTER — Other Ambulatory Visit (HOSPITAL_BASED_OUTPATIENT_CLINIC_OR_DEPARTMENT_OTHER): Payer: Medicare Other

## 2013-12-26 ENCOUNTER — Encounter: Payer: Self-pay | Admitting: Nurse Practitioner

## 2013-12-26 ENCOUNTER — Ambulatory Visit (HOSPITAL_BASED_OUTPATIENT_CLINIC_OR_DEPARTMENT_OTHER): Payer: Medicare Other | Admitting: Nurse Practitioner

## 2013-12-26 VITALS — BP 121/57 | HR 63 | Temp 98.1°F | Resp 18 | Ht 58.5 in | Wt 119.0 lb

## 2013-12-26 DIAGNOSIS — C9111 Chronic lymphocytic leukemia of B-cell type in remission: Secondary | ICD-10-CM

## 2013-12-26 DIAGNOSIS — C9110 Chronic lymphocytic leukemia of B-cell type not having achieved remission: Secondary | ICD-10-CM

## 2013-12-26 DIAGNOSIS — D649 Anemia, unspecified: Secondary | ICD-10-CM

## 2013-12-26 LAB — CBC WITH DIFFERENTIAL/PLATELET
BASO%: 0.5 % (ref 0.0–2.0)
Basophils Absolute: 0.8 10*3/uL — ABNORMAL HIGH (ref 0.0–0.1)
EOS%: 0.6 % (ref 0.0–7.0)
Eosinophils Absolute: 1 10*3/uL — ABNORMAL HIGH (ref 0.0–0.5)
HEMATOCRIT: 35.6 % (ref 34.8–46.6)
HGB: 11.4 g/dL — ABNORMAL LOW (ref 11.6–15.9)
LYMPH#: 145.6 10*3/uL — AB (ref 0.9–3.3)
LYMPH%: 93.7 % — AB (ref 14.0–49.7)
MCH: 29.4 pg (ref 25.1–34.0)
MCHC: 32 g/dL (ref 31.5–36.0)
MCV: 92.1 fL (ref 79.5–101.0)
MONO#: 1.8 10*3/uL — ABNORMAL HIGH (ref 0.1–0.9)
MONO%: 1.2 % (ref 0.0–14.0)
NEUT#: 6.2 10*3/uL (ref 1.5–6.5)
NEUT%: 4 % — ABNORMAL LOW (ref 38.4–76.8)
Platelets: 187 10*3/uL (ref 145–400)
RBC: 3.87 10*6/uL (ref 3.70–5.45)
RDW: 13.8 % (ref 11.2–14.5)
WBC: 155.4 10*3/uL (ref 3.9–10.3)

## 2013-12-26 LAB — COMPREHENSIVE METABOLIC PANEL (CC13)
ALT: 22 U/L (ref 0–55)
AST: 24 U/L (ref 5–34)
Albumin: 3.9 g/dL (ref 3.5–5.0)
Alkaline Phosphatase: 124 U/L (ref 40–150)
Anion Gap: 6 mEq/L (ref 3–11)
BILIRUBIN TOTAL: 0.33 mg/dL (ref 0.20–1.20)
BUN: 10.3 mg/dL (ref 7.0–26.0)
CHLORIDE: 106 meq/L (ref 98–109)
CO2: 27 mEq/L (ref 22–29)
CREATININE: 0.9 mg/dL (ref 0.6–1.1)
Calcium: 9.3 mg/dL (ref 8.4–10.4)
Glucose: 86 mg/dl (ref 70–140)
Potassium: 4.1 mEq/L (ref 3.5–5.1)
Sodium: 140 mEq/L (ref 136–145)
Total Protein: 6.4 g/dL (ref 6.4–8.3)

## 2013-12-26 LAB — TECHNOLOGIST REVIEW

## 2013-12-26 NOTE — Progress Notes (Signed)
ID: [name]   DOB: [birth year]  MR#: [id number]  WUJ#:[id number]  PCP: Osie Bond Md GYN: SU:  OTHER MD: [name] M.D.   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 She has been treated with Rituxan, ofatumumab, and cladribine, with details noted below.  INTERVAL HISTORY: Rachel [name] returns today for follow up of her chronic lymphoid leukemia. She started rituxamib and bendamustine 2 weeks ago and tolerated it well. Some of her symptoms included: constipation x 5 days which is rare for the patient to experience given her history of diarrhea, right ankle swelling and 8lb weight gain that has since resolved, and some mild nausea managed well with her PRN antiemetics.   REVIEW OF SYSTEMS: Rachel [name] denies fevers, chills, night sweats, adenopathy, or fatigue. She had some sinus issues including a post nasal drip and is treating this with allegra and mucinex. She continues to have stress urinary incontinence. She has some increased anxiety and depression, and she takes lorazepam PRN anxiety/sleep QHS. A detailed review of systems is otherwise noncontributory.   PAST MEDICAL HISTORY: Past Medical History  Diagnosis Date  . Cancer   . Leukemia   . Lower back pain     PAST SURGICAL HISTORY: Past Surgical History  Procedure Laterality Date  . Tonsillectomy    . Other surgical history      removal of uterine lining    FAMILY HISTORY Family History  Problem Relation Age of Onset  . Depression Son     GYNECOLOGIC HISTORY:  SOCIAL HISTORY: Her husband Rachel [name] is retired from [employer]. Her son Rachel [name] lives in [place] and has 3 sons; he works for a Pharmacist, community. A second son lives in [place] and has two children. Son Rachel [name] lives in [place] and has a daughter, as well as 2 sons by marriage.   ADVANCED DIRECTIVES:  HEALTH MAINTENANCE: History  Substance Use Topics   . Smoking status: Never Smoker   . Smokeless tobacco: Never Used  . Alcohol Use: No     Colonoscopy: due  PAP: December 2012/ [name]  Bone density: Due  Lipid panel: per Dr Drema Dallas  Allergies  Allergen Reactions  . Penicillins     "drops my heartbeat" "I just pass out"    Current Outpatient Prescriptions  Medication Sig Dispense Refill  . acyclovir (ZOVIRAX) 400 MG tablet Take 400 mg by mouth daily.    Marland Kitchen allopurinol (ZYLOPRIM) 300 MG tablet Take 300 mg by mouth daily.    Marland Kitchen aspirin 81 MG tablet Take 81 mg by mouth daily.    . Calcium Citrate (CITRACAL PO) Take 4 tablets by mouth daily.     . cholecalciferol (VITAMIN D) 1000 UNITS tablet Take 4,000 Units by mouth daily.    Marland Kitchen dexamethasone (DECADRON) 4 MG tablet Take 4 mg by mouth 2 (two) times daily. After chemo    . Estradiol (ELESTRIN) 0.52 MG/0.87 GM (0.06%) GEL Apply 2 application topically See admin instructions. 2 pumps thinly to upper arm daily at bedtime    . LORazepam (ATIVAN) 0.5 MG tablet Take 0.5 mg by mouth at bedtime as needed for anxiety.    . ondansetron (ZOFRAN) 4 MG tablet Take 4 mg by mouth 2 (two) times daily as needed for nausea or vomiting.    . progesterone (PROMETRIUM) 200 MG capsule Take 200 mg by mouth See admin instructions. on days 1-12 every other month    . traZODone (DESYREL) 50  MG tablet Take 50 mg by mouth at bedtime.    Marland Kitchen acetaminophen (TYLENOL) 500 MG tablet Take 500 mg by mouth every 6 (six) hours as needed for headache.    . lidocaine-prilocaine (EMLA) cream Apply 1 application topically as needed (for port). Apply nickel size amount to skin on top of port. Cover with saran  Wrap at least one hour prior to access.     No current facility-administered medications for this visit.    OBJECTIVE: Middle-aged white woman in no acute distress Filed Vitals:   12/26/13 1329  BP: 121/57  Pulse: 63  Temp: 98.1 F (36.7 C)  Resp: 18   Filed Weights   12/26/13 1329  Weight: 119 lb (53.978 kg)    Body mass index is 24.44 kg/(m^2).    ECOG FS: 1  Skin: warm, dry  HEENT: sclerae anicteric, conjunctivae pink, oropharynx clear. No thrush or mucositis.  Lymph Nodes: No cervical or supraclavicular lymphadenopathy  Lungs: clear to auscultation bilaterally, no rales, wheezes, or rhonci  Heart: regular rate and rhythm  Abdomen: round, soft, non tender, positive bowel sounds  Musculoskeletal: No focal spinal tenderness, no peripheral edema  Neuro: non focal, well oriented, positive affect  Breasts: deferred  LAB RESULTS:  Lab Results  Component Value Date   WBC 155.4* 12/26/2013   NEUTROABS 6.2 12/26/2013   HGB 11.4* 12/26/2013   HCT 35.6 12/26/2013   MCV 92.1 12/26/2013   PLT 187 12/26/2013      Chemistry      Component Value Date/Time   NA 140 12/26/2013 1313   NA 135* 06/04/2013 1130   K 4.1 12/26/2013 1313   K 4.1 06/04/2013 1130   CL 98 06/04/2013 1130   CL 101 12/15/2011 1322   CO2 27 12/26/2013 1313   CO2 24 06/04/2013 1130   BUN 10.3 12/26/2013 1313   BUN 9 06/04/2013 1130   CREATININE 0.9 12/26/2013 1313   CREATININE 0.61 06/04/2013 1130      Component Value Date/Time   CALCIUM 9.3 12/26/2013 1313   CALCIUM 9.0 06/04/2013 1130   ALKPHOS 124 12/26/2013 1313   ALKPHOS 112 06/04/2013 1130   AST 24 12/26/2013 1313   AST 19 06/04/2013 1130   ALT 22 12/26/2013 1313   ALT 22 06/04/2013 1130   BILITOT 0.33 12/26/2013 1313   BILITOT 0.5 06/04/2013 1130       STUDIES: No results found.  ASSESSMENT: 63 y.o.  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 The labs were reviewed in detail and her WBC count is trending down. Her anemia is much milder with a hbg up to 11.4 today. She has been tolerating the bendamusine/rituxan well and we will continue this regimen, next due on 11/17. She will also meet with Dr. Jana Hakim on this day. The plan is to continue at least 4 cycles then possibly an additional 2 cycles, depending on the response.  She will continue with labs draws every 2 weeks.   We discussed her constipation, and she is wary of starting stool softeners  for fear her diarrhea will return. I encouraged her to remain well hydrated regardless.   Rachel [name] understands and agrees with this plan. She knows the goal of treatment in her case is control. She has been encouraged to call with any issues that might arise before her next visit here.  Rachel [name]    12/26/2013

## 2013-12-27 ENCOUNTER — Telehealth: Payer: Self-pay | Admitting: *Deleted

## 2013-12-27 NOTE — Telephone Encounter (Signed)
Per staff message and POF I have scheduled appts. Advised scheduler of appts, advised scheduler appt on 12/10 to late for treatment. JMW

## 2013-12-28 ENCOUNTER — Telehealth: Payer: Self-pay | Admitting: *Deleted

## 2013-12-28 ENCOUNTER — Telehealth: Payer: Self-pay | Admitting: Oncology

## 2013-12-28 NOTE — Telephone Encounter (Signed)
Per staff message and POF I have scheduled appts. Advised scheduler of appts. JMW  

## 2013-12-28 NOTE — Telephone Encounter (Signed)
Lm w/ husband to for pt get a update sch at next appt d/t for Dec.

## 2013-12-30 ENCOUNTER — Ambulatory Visit (HOSPITAL_BASED_OUTPATIENT_CLINIC_OR_DEPARTMENT_OTHER): Payer: Medicare Other

## 2013-12-30 ENCOUNTER — Encounter: Payer: Self-pay | Admitting: Adult Health

## 2013-12-30 ENCOUNTER — Telehealth: Payer: Self-pay | Admitting: Adult Health

## 2013-12-30 ENCOUNTER — Other Ambulatory Visit: Payer: Self-pay | Admitting: *Deleted

## 2013-12-30 ENCOUNTER — Ambulatory Visit (HOSPITAL_BASED_OUTPATIENT_CLINIC_OR_DEPARTMENT_OTHER): Payer: Medicare Other | Admitting: Adult Health

## 2013-12-30 VITALS — BP 90/60 | HR 79 | Temp 98.4°F | Resp 18 | Ht 58.5 in | Wt 119.0 lb

## 2013-12-30 VITALS — BP 97/49 | HR 71 | Temp 98.1°F | Resp 18

## 2013-12-30 DIAGNOSIS — I959 Hypotension, unspecified: Secondary | ICD-10-CM | POA: Diagnosis not present

## 2013-12-30 DIAGNOSIS — C9110 Chronic lymphocytic leukemia of B-cell type not having achieved remission: Secondary | ICD-10-CM

## 2013-12-30 DIAGNOSIS — G4452 New daily persistent headache (NDPH): Secondary | ICD-10-CM

## 2013-12-30 DIAGNOSIS — R51 Headache: Secondary | ICD-10-CM

## 2013-12-30 DIAGNOSIS — Z95828 Presence of other vascular implants and grafts: Secondary | ICD-10-CM

## 2013-12-30 LAB — CBC WITH DIFFERENTIAL/PLATELET
BASO%: 0.8 % (ref 0.0–2.0)
Basophils Absolute: 0.5 10*3/uL — ABNORMAL HIGH (ref 0.0–0.1)
EOS%: 0.7 % (ref 0.0–7.0)
Eosinophils Absolute: 0.5 10*3/uL (ref 0.0–0.5)
HEMATOCRIT: 32.9 % — AB (ref 34.8–46.6)
HGB: 10.1 g/dL — ABNORMAL LOW (ref 11.6–15.9)
LYMPH#: 58.2 10*3/uL — AB (ref 0.9–3.3)
LYMPH%: 90.4 % — ABNORMAL HIGH (ref 14.0–49.7)
MCH: 28.5 pg (ref 25.1–34.0)
MCHC: 30.8 g/dL — ABNORMAL LOW (ref 31.5–36.0)
MCV: 92.7 fL (ref 79.5–101.0)
MONO#: 1.4 10*3/uL — AB (ref 0.1–0.9)
MONO%: 2.2 % (ref 0.0–14.0)
NEUT%: 5.9 % — AB (ref 38.4–76.8)
NEUTROS ABS: 3.8 10*3/uL (ref 1.5–6.5)
Platelets: 136 10*3/uL — ABNORMAL LOW (ref 145–400)
RBC: 3.55 10*6/uL — ABNORMAL LOW (ref 3.70–5.45)
RDW: 13.9 % (ref 11.2–14.5)
WBC: 64.4 10*3/uL — AB (ref 3.9–10.3)

## 2013-12-30 LAB — COMPREHENSIVE METABOLIC PANEL (CC13)
ALT: 19 U/L (ref 0–55)
ANION GAP: 9 meq/L (ref 3–11)
AST: 20 U/L (ref 5–34)
Albumin: 3.5 g/dL (ref 3.5–5.0)
Alkaline Phosphatase: 104 U/L (ref 40–150)
BUN: 9.7 mg/dL (ref 7.0–26.0)
CALCIUM: 8.4 mg/dL (ref 8.4–10.4)
CHLORIDE: 105 meq/L (ref 98–109)
CO2: 25 meq/L (ref 22–29)
CREATININE: 0.8 mg/dL (ref 0.6–1.1)
Glucose: 153 mg/dl — ABNORMAL HIGH (ref 70–140)
Potassium: 3.6 mEq/L (ref 3.5–5.1)
SODIUM: 139 meq/L (ref 136–145)
TOTAL PROTEIN: 5.7 g/dL — AB (ref 6.4–8.3)
Total Bilirubin: 0.32 mg/dL (ref 0.20–1.20)

## 2013-12-30 LAB — TECHNOLOGIST REVIEW

## 2013-12-30 MED ORDER — PROCHLORPERAZINE EDISYLATE 5 MG/ML IJ SOLN
5.0000 mg | Freq: Once | INTRAMUSCULAR | Status: AC
  Administered 2013-12-30: 5 mg via INTRAVENOUS

## 2013-12-30 MED ORDER — PROCHLORPERAZINE EDISYLATE 5 MG/ML IJ SOLN
INTRAMUSCULAR | Status: AC
  Filled 2013-12-30: qty 2

## 2013-12-30 MED ORDER — SODIUM CHLORIDE 0.9 % IV SOLN: INTRAVENOUS | Status: DC

## 2013-12-30 MED ORDER — HYDROCODONE-ACETAMINOPHEN 5-325 MG PO TABS: 1.0000 | ORAL_TABLET | Freq: Four times a day (QID) | ORAL | Status: DC | PRN

## 2013-12-30 MED ORDER — HEPARIN SOD (PORK) LOCK FLUSH 100 UNIT/ML IV SOLN
500.0000 [IU] | Freq: Once | INTRAVENOUS | Status: AC
  Administered 2013-12-30: 500 [IU] via INTRAVENOUS
  Filled 2013-12-30: qty 5

## 2013-12-30 MED ORDER — SODIUM CHLORIDE 0.9 % IV SOLN
1000.0000 mL | Freq: Once | INTRAVENOUS | Status: AC
  Administered 2013-12-30: 1000 mL via INTRAVENOUS

## 2013-12-30 MED ORDER — SODIUM CHLORIDE 0.9 % IJ SOLN
10.0000 mL | INTRAMUSCULAR | Status: DC | PRN
Start: 2013-12-30 — End: 2013-12-30
  Administered 2013-12-30: 10 mL via INTRAVENOUS
  Filled 2013-12-30: qty 10

## 2013-12-30 NOTE — Progress Notes (Signed)
Mendel Ryder, NP notified of pt's low BP, pt denies dizziness.

## 2013-12-30 NOTE — Patient Instructions (Signed)
Dehydration, Adult Dehydration is when you lose more fluids from the body than you take in. Vital organs like the kidneys, brain, and heart cannot function without a proper amount of fluids and salt. Any loss of fluids from the body can cause dehydration.  CAUSES   Vomiting.  Diarrhea.  Excessive sweating.  Excessive urine output.  Fever. SYMPTOMS  Mild dehydration  Thirst.  Dry lips.  Slightly dry mouth. Moderate dehydration  Very dry mouth.  Sunken eyes.  Skin does not bounce back quickly when lightly pinched and released.  Dark urine and decreased urine production.  Decreased tear production.  Headache. Severe dehydration  Very dry mouth.  Extreme thirst.  Rapid, weak pulse (more than 100 beats per minute at rest).  Cold hands and feet.  Not able to sweat in spite of heat and temperature.  Rapid breathing.  Blue lips.  Confusion and lethargy.  Difficulty being awakened.  Minimal urine production.  No tears. DIAGNOSIS  Your caregiver will diagnose dehydration based on your symptoms and your exam. Blood and urine tests will help confirm the diagnosis. The diagnostic evaluation should also identify the cause of dehydration. TREATMENT  Treatment of mild or moderate dehydration can often be done at home by increasing the amount of fluids that you drink. It is best to drink small amounts of fluid more often. Drinking too much at one time can make vomiting worse. Refer to the home care instructions below. Severe dehydration needs to be treated at the hospital where you will probably be given intravenous (IV) fluids that contain water and electrolytes. HOME CARE INSTRUCTIONS   Ask your caregiver about specific rehydration instructions.  Drink enough fluids to keep your urine clear or pale yellow.  Drink small amounts frequently if you have nausea and vomiting.  Eat as you normally do.  Avoid:  Foods or drinks high in sugar.  Carbonated  drinks.  Juice.  Extremely hot or cold fluids.  Drinks with caffeine.  Fatty, greasy foods.  Alcohol.  Tobacco.  Overeating.  Gelatin desserts.  Wash your hands well to avoid spreading bacteria and viruses.  Only take over-the-counter or prescription medicines for pain, discomfort, or fever as directed by your caregiver.  Ask your caregiver if you should continue all prescribed and over-the-counter medicines.  Keep all follow-up appointments with your caregiver. SEEK MEDICAL CARE IF:  You have abdominal pain and it increases or stays in one area (localizes).  You have a rash, stiff neck, or severe headache.  You are irritable, sleepy, or difficult to awaken.  You are weak, dizzy, or extremely thirsty. SEEK IMMEDIATE MEDICAL CARE IF:   You are unable to keep fluids down or you get worse despite treatment.  You have frequent episodes of vomiting or diarrhea.  You have blood or green matter (bile) in your vomit.  You have blood in your stool or your stool looks black and tarry.  You have not urinated in 6 to 8 hours, or you have only urinated a small amount of very dark urine.  You have a fever.  You faint. MAKE SURE YOU:   Understand these instructions.  Will watch your condition.  Will get help right away if you are not doing well or get worse. Document Released: 02/10/2005 Document Revised: 05/05/2011 Document Reviewed: 09/30/2010 ExitCare Patient Information 2015 [employer]. This information is not intended to replace advice given to you by your health care provider. Make sure you discuss any questions you have with your health care   provider.  

## 2013-12-30 NOTE — Progress Notes (Addendum)
ID: Rachel [name]   DOB: 04-Aug-1950  MR#: [id number]  HEN#:[id number]  PCP: Osie Bond Md GYN: SU:  OTHER MD: [name] M.D.   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 She has been treated with Rituxan, ofatumumab, and cladribine, with details noted below.  INTERVAL HISTORY: Rachel [name] returns today for follow up of her chronic lymphoid leukemia. She is currently receiving treatment with Rituximab-Bendamustine started on 12/12/13.  She is not feeling well today.  She is here for an urgent appointment due to a headache in her parietal area of her head bilaterally. She has taken tylenol without relief.  She is mildly hypotensive today and does report being dizzy.  She is also getting nauseated.    She denies fevers, chills, photophobia, weakness, numbness, vision changes, vomiting or any other problems.    REVIEW OF SYSTEMS: A 10 point review of systems was conducted and is otherwise negative except for what is noted above.     PAST MEDICAL HISTORY: Past Medical History  Diagnosis Date  . Cancer   . Leukemia   . Lower back pain     PAST SURGICAL HISTORY: Past Surgical History  Procedure Laterality Date  . Tonsillectomy    . Other surgical history      removal of uterine lining    FAMILY HISTORY Family History  Problem Relation Age of Onset  . Depression Son     GYNECOLOGIC HISTORY:  SOCIAL HISTORY: Her husband Rachel [name] is retired from [employer]. Her son Rachel [name] lives in [place] and has 3 sons; he works for a Pharmacist, community. A second son lives in [place] and has two children. Son Rachel [name] lives in [place] and has a daughter, as well as 2 sons by marriage.   ADVANCED DIRECTIVES:  HEALTH MAINTENANCE: History  Substance Use Topics  . Smoking status: Never Smoker   . Smokeless tobacco: Never Used  . Alcohol Use: No     Colonoscopy: due  PAP: December  2012/ [name]  Bone density: Due  Lipid panel: per Dr Drema Dallas  Allergies  Allergen Reactions  . Penicillins     "drops my heartbeat" "I just pass out"    Current Outpatient Prescriptions  Medication Sig Dispense Refill  . acyclovir (ZOVIRAX) 400 MG tablet Take 400 mg by mouth daily.    Marland Kitchen allopurinol (ZYLOPRIM) 300 MG tablet Take 300 mg by mouth daily.    Marland Kitchen aspirin 81 MG tablet Take 81 mg by mouth daily.    . Calcium Citrate (CITRACAL PO) Take 4 tablets by mouth daily.     . cholecalciferol (VITAMIN D) 1000 UNITS tablet Take 4,000 Units by mouth daily.    . Estradiol (ELESTRIN) 0.52 MG/0.87 GM (0.06%) GEL Apply 2 application topically See admin instructions. 2 pumps thinly to upper arm daily at bedtime    . LORazepam (ATIVAN) 0.5 MG tablet Take 0.5 mg by mouth at bedtime as needed for anxiety.    . progesterone (PROMETRIUM) 200 MG capsule Take 200 mg by mouth See admin instructions. on days 1-12 every other month    . traZODone (DESYREL) 50 MG tablet Take 50 mg by mouth at bedtime.    Marland Kitchen acetaminophen (TYLENOL) 500 MG tablet Take 500 mg by mouth every 6 (six) hours as needed for headache.    . dexamethasone (DECADRON) 4 MG tablet Take 4 mg by mouth 2 (two) times daily. After chemo    . lidocaine-prilocaine (EMLA)  cream Apply 1 application topically as needed (for port). Apply nickel size amount to skin on top of port. Cover with saran  Wrap at least one hour prior to access.    . ondansetron (ZOFRAN) 4 MG tablet Take 4 mg by mouth 2 (two) times daily as needed for nausea or vomiting.     Current Facility-Administered Medications  Medication Dose Route Frequency Provider Last Rate Last Dose  . 0.9 %  sodium chloride infusion  1,000 mL Intravenous Once Minette Headland, NP      . prochlorperazine (COMPAZINE) injection 5 mg  5 mg Intravenous Once Minette Headland, NP        OBJECTIVE: Middle-aged white woman in no acute distress Filed Vitals:   12/30/13 1208  BP: 90/60  Pulse: 79   Temp: 98.4 F (36.9 C)  Resp: 18   Filed Weights   12/30/13 1208  Weight: 119 lb (53.978 kg)   Body mass index is 24.44 kg/(m^2).    ECOG FS: 1  GENERAL: Patient is a well appearing female in no acute distress HEENT:  Sclerae anicteric.  Oropharynx clear and moist. No ulcerations or evidence of oropharyngeal candidiasis. Neck is supple. PERRLA NODES:  No cervical, supraclavicular, or axillary lymphadenopathy palpated.  BREAST EXAM:  Deferred. LUNGS:  Clear to auscultation bilaterally.  No wheezes or rhonchi. HEART:  Regular rate and rhythm. No murmur appreciated. ABDOMEN:  Soft, nontender.  Positive, normoactive bowel sounds. No organomegaly palpated. MSK:  No focal spinal tenderness to palpation. Full range of motion bilaterally in the upper extremities. EXTREMITIES:  No peripheral edema.  Strength 5/5 x 4 SKIN:  Clear with no obvious rashes or skin changes. No nail dyscrasia. NEURO:  Nonfocal. Well oriented.  Appropriate affect.    LAB RESULTS:  Lab Results  Component Value Date   WBC 155.4* 12/26/2013   NEUTROABS 6.2 12/26/2013   HGB 11.4* 12/26/2013   HCT 35.6 12/26/2013   MCV 92.1 12/26/2013   PLT 187 12/26/2013      Chemistry      Component Value Date/Time   NA 140 12/26/2013 1313   NA 135* 06/04/2013 1130   K 4.1 12/26/2013 1313   K 4.1 06/04/2013 1130   CL 98 06/04/2013 1130   CL 101 12/15/2011 1322   CO2 27 12/26/2013 1313   CO2 24 06/04/2013 1130   BUN 10.3 12/26/2013 1313   BUN 9 06/04/2013 1130   CREATININE 0.9 12/26/2013 1313   CREATININE 0.61 06/04/2013 1130      Component Value Date/Time   CALCIUM 9.3 12/26/2013 1313   CALCIUM 9.0 06/04/2013 1130   ALKPHOS 124 12/26/2013 1313   ALKPHOS 112 06/04/2013 1130   AST 24 12/26/2013 1313   AST 19 06/04/2013 1130   ALT 22 12/26/2013 1313   ALT 22 06/04/2013 1130   BILITOT 0.33 12/26/2013 1313   BILITOT 0.5 06/04/2013 1130       STUDIES: No results found.  ASSESSMENT: 63 y.o.  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  She  did receive Rituximab/Bendamustine on 12/12/13 and her WBC is now down to 64K.  She is hypotensive.  She is symptomatic.  She received 1L of IV fluids in the treatment room and her bp worsened to 88/55.  I reviewed the patient with Dr. Jana Hakim.  He would like for her to get another liter of IV fluids and will go and see her in the treatment room.  He did not recommend an MRI.  He did request the patient be added on for IV fluids tomorrow.  I have sent this request to the scheduler.    I spent 40 minutes counseling the patient face to face.  The total time spent in the appointment was 50 minutes.   Minette Headland, [place] [phone] 12/30/2013    ADDENDUM: Even though Rachel [name] tells me she drinks "a lot of water" every day, nonetheless I think she most of been dehydrated. She is feeling considerably better just receiving fluids. I offered to give her some more fluids tomorrow, but she does not think she will need them. We are going ahead and scheduling them just in case she becomes symptomatic again.  Otherwise I reassured her that her drop in white cells is impressive. It is not uncommon for patients to have persistent adenopathy while and this treatment, at least initially and it does not indicate that the treatment is not working.  She already has an appointment here for next week. She will  keep that appointment. She knows to call for any other issues that may develop before then.  I personally saw this patient and performed a substantive portion of this encounter with the listed APP documented above.   Chauncey Cruel, MD

## 2013-12-31 ENCOUNTER — Ambulatory Visit: Payer: Medicare Other

## 2013-12-31 ENCOUNTER — Encounter: Payer: Self-pay | Admitting: Emergency Medicine

## 2013-12-31 NOTE — Progress Notes (Signed)
Patient called to cancel IV fluids today. States she is feeling better. Instructed patient to call if symptoms worsen and to keep all scheduled appointments. Patient verbalized understanding.

## 2014-01-02 ENCOUNTER — Telehealth: Payer: Self-pay | Admitting: Emergency Medicine

## 2014-01-02 ENCOUNTER — Other Ambulatory Visit: Payer: Self-pay | Admitting: Emergency Medicine

## 2014-01-02 DIAGNOSIS — C9110 Chronic lymphocytic leukemia of B-cell type not having achieved remission: Secondary | ICD-10-CM

## 2014-01-02 NOTE — Telephone Encounter (Signed)
Patient calls with complaints of headaches; states she is drinking 60-70 ozs of water a day and is taking the hydrocodone as needed for severe pain.   Patient would like to cancel appointments on 11/13 and see Dr Jana Hakim on 11/16 as scheduled.

## 2014-01-03 ENCOUNTER — Ambulatory Visit (HOSPITAL_BASED_OUTPATIENT_CLINIC_OR_DEPARTMENT_OTHER): Payer: Medicare Other | Admitting: Nurse Practitioner

## 2014-01-03 ENCOUNTER — Telehealth: Payer: Self-pay | Admitting: Oncology

## 2014-01-03 VITALS — BP 98/64 | HR 84 | Temp 98.1°F | Resp 18 | Ht 58.5 in | Wt 120.7 lb

## 2014-01-03 DIAGNOSIS — R590 Localized enlarged lymph nodes: Secondary | ICD-10-CM | POA: Diagnosis not present

## 2014-01-03 DIAGNOSIS — C9110 Chronic lymphocytic leukemia of B-cell type not having achieved remission: Secondary | ICD-10-CM

## 2014-01-03 DIAGNOSIS — R51 Headache: Secondary | ICD-10-CM

## 2014-01-03 DIAGNOSIS — R519 Headache, unspecified: Secondary | ICD-10-CM

## 2014-01-03 NOTE — Telephone Encounter (Signed)
, °

## 2014-01-04 ENCOUNTER — Ambulatory Visit (HOSPITAL_BASED_OUTPATIENT_CLINIC_OR_DEPARTMENT_OTHER)
Admission: RE | Admit: 2014-01-04 | Discharge: 2014-01-04 | Disposition: A | Discharge status: Home / Self Care | Payer: Medicare Other | Source: Ambulatory Visit | Attending: Oncology | Admitting: Oncology

## 2014-01-04 DIAGNOSIS — I889 Nonspecific lymphadenitis, unspecified: Secondary | ICD-10-CM | POA: Insufficient documentation

## 2014-01-04 DIAGNOSIS — R51 Headache: Secondary | ICD-10-CM | POA: Diagnosis not present

## 2014-01-04 DIAGNOSIS — C9110 Chronic lymphocytic leukemia of B-cell type not having achieved remission: Secondary | ICD-10-CM | POA: Insufficient documentation

## 2014-01-04 MED ORDER — GADOBENATE DIMEGLUMINE 529 MG/ML IV SOLN: 15.0000 mL | Freq: Once | INTRAVENOUS | Status: AC | PRN

## 2014-01-05 ENCOUNTER — Encounter: Payer: Self-pay | Admitting: Nurse Practitioner

## 2014-01-05 DIAGNOSIS — R51 Headache: Secondary | ICD-10-CM

## 2014-01-05 DIAGNOSIS — R519 Headache, unspecified: Secondary | ICD-10-CM | POA: Insufficient documentation

## 2014-01-05 NOTE — Assessment & Plan Note (Addendum)
Patient is complaining of chronic headaches.  Patient appears neurologically intact.  However, patient does have multiple apparent enlarged lymph nodes to her bilateral cervical neck region; as well as to the posterior base of her neck.  These nodes are tender with palpation.  Patient has plans for a brain MRI this coming Wednesday, 11/11/ 2015.  Patient was encouraged to take her previously prescribed pain medication as directed.  She was also advised to return to the [facility] or go directly to the emergency department she develops any new or worsening symptoms whatsoever.

## 2014-01-05 NOTE — Progress Notes (Signed)
SYMPTOM MANAGEMENT CLINIC   HPI: Rachel [name] 63 y.o. female diagnosed with chronic leukocytic leukemia.  Currently undergoing rituximab/bendamustine  chemotherapy therapy regimen.   Patient called the cancer Center today requesting urgent care visit.  Patient is complaining of chronic headache.  She denies any neurological changes whatsoever.  She denies any vision changes.  She is complaining of chronic/worsening enlarged lymph nodes to her right and left cervical neck region; as well as to the base of her posterior neck.  She denies any recent fevers or chills.  HPI  CURRENT THERAPY: Upcoming Treatment Dates - NON-HODGKINS LYMPHOMA Rituximab D1 / Bendamustine D1,2 q28d Days with orders from any treatment category:  01/09/2014      SCHEDULING COMMUNICATION      ondansetron (ZOFRAN) IVPB 8 mg      dexamethasone (DECADRON) injection 10 mg      acetaminophen (TYLENOL) tablet 650 mg      diphenhydrAMINE (BENADRYL) capsule 25 mg      riTUXimab (RITUXAN) 600 mg in sodium chloride 0.9 % 250 mL (1.9355 mg/mL) chemo infusion      bendamustine (TREANDA) 135 mg in sodium chloride 0.9 % 500 mL chemo infusion      sodium chloride 0.9 % injection 10 mL      heparin lock flush 100 unit/mL      heparin lock flush 100 unit/mL      alteplase (CATHFLO ACTIVASE) injection 2 mg      sodium chloride 0.9 % injection 3 mL      diphenhydrAMINE (BENADRYL) injection 25 mg      famotidine (PEPCID) IVPB 20 mg      0.9 %  sodium chloride infusion      methylPREDNISolone sodium succinate (SOLU-MEDROL) 125 mg/2 mL injection 125 mg      EPINEPHrine (ADRENALIN) 0.1 MG/ML injection 0.25 mg      EPINEPHrine (ADRENALIN) 0.1 MG/ML injection 0.25 mg      EPINEPHrine (ADRENALIN) injection 0.5 mg      EPINEPHrine (ADRENALIN) injection 0.5 mg      diphenhydrAMINE (BENADRYL) injection 50 mg      albuterol (PROVENTIL) (2.5 MG/3ML) 0.083% nebulizer solution 2.5 mg      0.9 %  sodium chloride infusion  TREATMENT CONDITIONS 01/10/2014      SCHEDULING COMMUNICATION      ondansetron (ZOFRAN) IVPB 8 mg      dexamethasone (DECADRON) injection 10 mg      bendamustine (TREANDA) 135 mg in sodium chloride 0.9 % 500 mL chemo infusion      sodium chloride 0.9 % injection 10 mL      heparin lock flush 100 unit/mL      heparin lock flush 100 unit/mL      alteplase (CATHFLO ACTIVASE) injection 2 mg      sodium chloride 0.9 % injection 3 mL      0.9 %  sodium chloride infusion 02/06/2014      SCHEDULING COMMUNICATION      ondansetron (ZOFRAN) IVPB 8 mg      dexamethasone (DECADRON) injection 10 mg      acetaminophen (TYLENOL) tablet 650 mg      diphenhydrAMINE (BENADRYL) capsule 25 mg      riTUXimab (RITUXAN) 600 mg in sodium chloride 0.9 % 250 mL (1.9355 mg/mL) chemo infusion      bendamustine (TREANDA) 135 mg in sodium chloride 0.9 % 500 mL chemo infusion      sodium chloride 0.9 % injection 10 mL  heparin lock flush 100 unit/mL      heparin lock flush 100 unit/mL      alteplase (CATHFLO ACTIVASE) injection 2 mg      sodium chloride 0.9 % injection 3 mL      diphenhydrAMINE (BENADRYL) injection 25 mg      famotidine (PEPCID) IVPB 20 mg      0.9 %  sodium chloride infusion      methylPREDNISolone sodium succinate (SOLU-MEDROL) 125 mg/2 mL injection 125 mg      EPINEPHrine (ADRENALIN) 0.1 MG/ML injection 0.25 mg      EPINEPHrine (ADRENALIN) 0.1 MG/ML injection 0.25 mg      EPINEPHrine (ADRENALIN) injection 0.5 mg      EPINEPHrine (ADRENALIN) injection 0.5 mg      diphenhydrAMINE (BENADRYL) injection 50 mg      albuterol (PROVENTIL) (2.5 MG/3ML) 0.083% nebulizer solution 2.5 mg      0.9 %  sodium chloride infusion      TREATMENT CONDITIONS    ROS  Past Medical History  Diagnosis Date  . Cancer   . Leukemia   . Lower back pain     Past Surgical History  Procedure Laterality Date  . Tonsillectomy    . Other surgical history      removal of uterine lining    has CLL (chronic  lymphocytic leukemia) and Headache on her problem list.     is allergic to penicillins.    Medication List       This list is accurate as of: 01/03/14 11:59 PM.  Always use your most recent med list.               acetaminophen 500 MG tablet  Commonly known as:  TYLENOL  Take 500 mg by mouth every 6 (six) hours as needed for headache.     acyclovir 400 MG tablet  Commonly known as:  ZOVIRAX  Take 400 mg by mouth daily.     allopurinol 300 MG tablet  Commonly known as:  ZYLOPRIM  Take 300 mg by mouth daily.     aspirin 81 MG tablet  Take 81 mg by mouth daily.     cholecalciferol 1000 UNITS tablet  Commonly known as:  VITAMIN D  Take 4,000 Units by mouth daily.     CITRACAL PO  Take 4 tablets by mouth daily.     dexamethasone 4 MG tablet  Commonly known as:  DECADRON  Take 4 mg by mouth 2 (two) times daily. After chemo     ELESTRIN 0.52 MG/0.87 GM (0.06%) Gel  Generic drug:  Estradiol  Apply 2 application topically See admin instructions. 2 pumps thinly to upper arm daily at bedtime     HYDROcodone-acetaminophen 5-325 MG per tablet  Commonly known as:  NORCO/VICODIN  Take 1 tablet by mouth every 6 (six) hours as needed for moderate pain.     lidocaine-prilocaine cream  Commonly known as:  EMLA  Apply 1 application topically as needed (for port). Apply nickel size amount to skin on top of port. Cover with saran  Wrap at least one hour prior to access.     LORazepam 0.5 MG tablet  Commonly known as:  ATIVAN  Take 0.5 mg by mouth at bedtime as needed for anxiety.     ondansetron 4 MG tablet  Commonly known as:  ZOFRAN  Take 4 mg by mouth 2 (two) times daily as needed for nausea or vomiting.     progesterone 200 MG capsule  Commonly known as:  PROMETRIUM  Take 200 mg by mouth See admin instructions. on days 1-12 every other month     traZODone 50 MG tablet  Commonly known as:  DESYREL  Take 50 mg by mouth at bedtime.         PHYSICAL  EXAMINATION  Blood pressure 98/64, pulse 84, temperature 98.1 F (36.7 C), temperature source Oral, resp. rate 18, height 4' 10.5" (1.486 m), weight 120 lb 11.2 oz (54.749 kg).  Physical Exam  Constitutional: She is oriented to person, place, and time and well-developed, well-nourished, and in no distress.  HENT:  Head: Normocephalic and atraumatic.  Right Ear: External ear normal.  Left Ear: External ear normal.  Mouth/Throat: Oropharynx is clear and moist.  Patient with multiple enlarged bilateral cervical neck region nodes.  Also has some enlarged nodes to posterior neck as well.  All nodes do appear tender with palpation.  Eyes: Conjunctivae and EOM are normal. Pupils are equal, round, and reactive to light. Right eye exhibits no discharge. Left eye exhibits no discharge. No scleral icterus.  Neck: Normal range of motion. Neck supple. No JVD present. No tracheal deviation present. No thyromegaly present.  Cardiovascular: Normal rate, regular rhythm, normal heart sounds and intact distal pulses.   Pulmonary/Chest: Effort normal and breath sounds normal. No respiratory distress. She has no wheezes. She has no rales.  Abdominal: Soft. Bowel sounds are normal. She exhibits no distension. There is no tenderness. There is no rebound and no guarding.  Musculoskeletal: Normal range of motion. She exhibits no edema or tenderness.  Lymphadenopathy:    She has no cervical adenopathy.  Neurological: She is alert and oriented to person, place, and time. Gait normal.  Skin: Skin is warm and dry. No rash noted. No erythema.  Psychiatric: Affect normal.  Nursing note and vitals reviewed.   LABORATORY DATA:. No visits with results within 3 Day(s) from this visit. Latest known visit with results is:  Appointment on 12/30/2013  Component Date Value Ref Range Status  . WBC 12/30/2013 64.4* 3.9 - 10.3 10e3/uL Final  . NEUT# 12/30/2013 3.8  1.5 - 6.5 10e3/uL Final  . HGB 12/30/2013 10.1* 11.6 - 15.9  g/dL Final  . HCT 12/30/2013 32.9* 34.8 - 46.6 % Final  . Platelets 12/30/2013 136* 145 - 400 10e3/uL Final  . MCV 12/30/2013 92.7  79.5 - 101.0 fL Final  . MCH 12/30/2013 28.5  25.1 - 34.0 pg Final  . MCHC 12/30/2013 30.8* 31.5 - 36.0 g/dL Final  . RBC 12/30/2013 3.55* 3.70 - 5.45 10e6/uL Final  . RDW 12/30/2013 13.9  11.2 - 14.5 % Final  . lymph# 12/30/2013 58.2* 0.9 - 3.3 10e3/uL Final  . MONO# 12/30/2013 1.4* 0.1 - 0.9 10e3/uL Final  . Eosinophils Absolute 12/30/2013 0.5  0.0 - 0.5 10e3/uL Final  . Basophils Absolute 12/30/2013 0.5* 0.0 - 0.1 10e3/uL Final  . NEUT% 12/30/2013 5.9* 38.4 - 76.8 % Final  . LYMPH% 12/30/2013 90.4* 14.0 - 49.7 % Final  . MONO% 12/30/2013 2.2  0.0 - 14.0 % Final  . EOS% 12/30/2013 0.7  0.0 - 7.0 % Final  . BASO% 12/30/2013 0.8  0.0 - 2.0 % Final  . Sodium 12/30/2013 139  136 - 145 mEq/L Final  . Potassium 12/30/2013 3.6  3.5 - 5.1 mEq/L Final  . Chloride 12/30/2013 105  98 - 109 mEq/L Final  . CO2 12/30/2013 25  22 - 29 mEq/L Final  . Glucose 12/30/2013 153* 70 - 140  mg/dl Final  . BUN 12/30/2013 9.7  7.0 - 26.0 mg/dL Final  . Creatinine 12/30/2013 0.8  0.6 - 1.1 mg/dL Final  . Total Bilirubin 12/30/2013 0.32  0.20 - 1.20 mg/dL Final  . Alkaline Phosphatase 12/30/2013 104  40 - 150 U/L Final  . AST 12/30/2013 20  5 - 34 U/L Final  . ALT 12/30/2013 19  0 - 55 U/L Final  . Total Protein 12/30/2013 5.7* 6.4 - 8.3 g/dL Final  . Albumin 12/30/2013 3.5  3.5 - 5.0 g/dL Final  . Calcium 12/30/2013 8.4  8.4 - 10.4 mg/dL Final  . Anion Gap 12/30/2013 9  3 - 11 mEq/L Final  . Technologist Review 12/30/2013 Variant lymphs present, Few Smudge cells   Final     RADIOGRAPHIC STUDIES: Mr Jeri Cos Wo Contrast  01-29-14   CLINICAL DATA:  Chronic lymphocytic leukemia. Headaches focus near the vertex. Swelling in the lymph glands.  EXAM: MRI HEAD WITHOUT AND WITH CONTRAST  TECHNIQUE: Multiplanar, multiecho pulse sequences of the brain and surrounding structures were  obtained without and with intravenous contrast.  CONTRAST:  11 mL MultiHance  COMPARISON:  CT of the sinuses 04/30/2009  FINDINGS: Numerous bilateral level 2 and level 3 lymph nodes are noted bilaterally. Nodes on the right measure up to 16 mm. Left-sided nodes measure up to 20 mm. Although the nodes do not enhance, there is restricted diffusion within these nodes.  No acute intracranial abnormality is present. Scattered subcortical T2 hyperintensities are slightly greater than expected for age. There is no associated enhancement or mass lesion. No hemorrhage or acute infarct is present. The ventricles are of normal size.  Flow is present in the major intracranial arteries. The globes and orbits are intact. The paranasal sinuses and mastoid air cells are clear.  The postcontrast images demonstrate no parenchymal enhancement.  IMPRESSION: 1. Scattered subcortical T2 hyperintensities are slightly greater than expected for age. There is no associated enhancement. The finding is nonspecific but can be seen in the setting of chronic microvascular ischemia, a demyelinating process such as multiple sclerosis, vasculitis, complicated migraine headaches, or as the sequelae of a prior infectious or inflammatory process. 2. Otherwise normal MRI of the brain for age. No acute or focal mass lesion is evident to suggest leukemia within the brain. 3. Enlarged bilateral level 2 and level 3 lymph nodes are present with restricted diffusion, concerning for residual or recurrent leukemia.   Electronically Signed   By: Lawrence Santiago M.D.   On: 01-29-14 18:09    ASSESSMENT/PLAN:    Chronic lymphocytic leukemia: Patient received cycle 1, day 1 of her rituximab/bendamustine chemotherapy regimen on 12/12/2013.  She has plans to return for cycle 2 of the same regimen on 01/09/2014.   Headache: Patient is complaining of chronic headaches.  Patient appears neurologically intact.  However, patient does have multiple apparent  enlarged lymph nodes to her bilateral cervical neck region; as well as to the posterior base of her neck.  These nodes are tender with palpation.  Patient has plans for a brain MRI this coming Wednesday, 11/11/ 2015.  Patient was encouraged to take her previously prescribed pain medication as directed.  She was also advised to return to the [facility] or go directly to the emergency department she develops any new or worsening symptoms whatsoever.  Patient has plans to return 01/09/2014 to review her scan results  Patient stated understanding of all instructions; and was in agreement with this plan of care. The patient  knows to call the clinic with any problems, questions or concerns.   Review/collaboration with Dr. Jana Hakim regarding all aspects of patient's visit today.   Total time spent with patient was 25 minutes;  with greater than 75 percent of that time spent in face to face counseling regarding her symptoms,  and coordination of care and follow up.  Disclaimer: This note was dictated with voice recognition software. Similar sounding words can inadvertently be transcribed and may not be corrected upon review.   Drue Second, NP 01/05/2014

## 2014-01-05 NOTE — Assessment & Plan Note (Signed)
Patient received cycle 1, day 1 of her rituximab/bendamustine chemotherapy regimen on 12/12/2013.  She has plans to return for cycle 2 of the same regimen on 01/09/2014.

## 2014-01-06 ENCOUNTER — Other Ambulatory Visit: Payer: Medicare Other

## 2014-01-06 ENCOUNTER — Encounter: Payer: Medicare Other | Admitting: Adult Health

## 2014-01-06 NOTE — Progress Notes (Signed)
This encounter was created in error - please disregard.

## 2014-01-09 ENCOUNTER — Other Ambulatory Visit (HOSPITAL_BASED_OUTPATIENT_CLINIC_OR_DEPARTMENT_OTHER): Payer: Medicare Other

## 2014-01-09 ENCOUNTER — Ambulatory Visit (HOSPITAL_BASED_OUTPATIENT_CLINIC_OR_DEPARTMENT_OTHER): Payer: Medicare Other | Admitting: Oncology

## 2014-01-09 VITALS — BP 94/52 | HR 76 | Temp 98.4°F | Resp 18 | Ht 59.0 in | Wt 118.8 lb

## 2014-01-09 DIAGNOSIS — C9110 Chronic lymphocytic leukemia of B-cell type not having achieved remission: Secondary | ICD-10-CM | POA: Diagnosis not present

## 2014-01-09 DIAGNOSIS — R591 Generalized enlarged lymph nodes: Secondary | ICD-10-CM

## 2014-01-09 LAB — CBC WITH DIFFERENTIAL/PLATELET
BASO%: 0.1 % (ref 0.0–2.0)
Basophils Absolute: 0.1 10*3/uL (ref 0.0–0.1)
EOS%: 1.3 % (ref 0.0–7.0)
Eosinophils Absolute: 0.6 10*3/uL — ABNORMAL HIGH (ref 0.0–0.5)
HCT: 33.1 % — ABNORMAL LOW (ref 34.8–46.6)
HGB: 10.4 g/dL — ABNORMAL LOW (ref 11.6–15.9)
LYMPH#: 46.7 10*3/uL — AB (ref 0.9–3.3)
LYMPH%: 91.7 % — AB (ref 14.0–49.7)
MCH: 29.2 pg (ref 25.1–34.0)
MCHC: 31.4 g/dL — ABNORMAL LOW (ref 31.5–36.0)
MCV: 92.8 fL (ref 79.5–101.0)
MONO#: 0.3 10*3/uL (ref 0.1–0.9)
MONO%: 0.7 % (ref 0.0–14.0)
NEUT#: 3.2 10*3/uL (ref 1.5–6.5)
NEUT%: 6.2 % — ABNORMAL LOW (ref 38.4–76.8)
Platelets: 153 10*3/uL (ref 145–400)
RBC: 3.56 10*6/uL — ABNORMAL LOW (ref 3.70–5.45)
RDW: 14 % (ref 11.2–14.5)
WBC: 50.9 10*3/uL (ref 3.9–10.3)

## 2014-01-09 LAB — COMPREHENSIVE METABOLIC PANEL (CC13)
ALK PHOS: 158 U/L — AB (ref 40–150)
ALT: 149 U/L — AB (ref 0–55)
AST: 149 U/L — AB (ref 5–34)
Albumin: 3.5 g/dL (ref 3.5–5.0)
Anion Gap: 7 mEq/L (ref 3–11)
BILIRUBIN TOTAL: 0.4 mg/dL (ref 0.20–1.20)
BUN: 11.8 mg/dL (ref 7.0–26.0)
CO2: 27 mEq/L (ref 22–29)
CREATININE: 0.8 mg/dL (ref 0.6–1.1)
Calcium: 9.2 mg/dL (ref 8.4–10.4)
Chloride: 107 mEq/L (ref 98–109)
Glucose: 98 mg/dl (ref 70–140)
Potassium: 4 mEq/L (ref 3.5–5.1)
Sodium: 141 mEq/L (ref 136–145)
Total Protein: 5.9 g/dL — ABNORMAL LOW (ref 6.4–8.3)

## 2014-01-09 LAB — TECHNOLOGIST REVIEW

## 2014-01-09 NOTE — Progress Notes (Signed)
ID: Tisa Weisel Lord   DOB: 06-20-1950  MR#: [id number]  QQI#:[id number]  PCP: Osie Bond Md GYN: SU:  OTHER MD: [name] M.D.   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 She has been treated with Rituxan, ofatumumab, and cladribine, with details noted below.  INTERVAL HISTORY: [name] returns today for follow up of her chronic lymphoid leukemia accompanied by her husband and by her sister Verdis Frederickson, who will be with her for the next few days. Tomorrow is day 1 cycle 2 of 4-6 cycles of bendamustine/rituximab planned. Since the last visit here [name] had a brain MRI, which showed some changes secondary to her recent severe headaches. It also showed the easily palpable cervical and supraclavicular lymph nodes she has been concerned about. There was no evidence of meningeal carcinomatosis.  REVIEW OF SYSTEMS: She has not yet begun to lose her hair and is hoping to keep it. Her headaches resolved approximately 2 days ago. There were mostly nuchal, right greater than left. She still fatigued, and feels a little dizzy at times. She does not want to go to the gym to exercise but agrees to a walking program especially now that her sister is in town. She has had no fever. Her hot flashes are worse. Her weight is up a bit of which concerns her. The left axillary lymph nodes, which were painful, are now not painful. Her eyes are a bit dry and sometimes blurry. Her mouth is dry as well although she is drinking in Norma's amounts of water, she says. She has no shortness of breath. She has no cough other than to clear her throat. A detailed review of systems today was significant for anxiety, depression, forgetfulness, and some loose stools, but no frank diarrhea.    PAST MEDICAL HISTORY: Past Medical History  Diagnosis Date  . Cancer   . Leukemia   . Lower back pain     PAST SURGICAL HISTORY: Past  Surgical History  Procedure Laterality Date  . Tonsillectomy    . Other surgical history      removal of uterine lining    FAMILY HISTORY Family History  Problem Relation Age of Onset  . Depression Son     GYNECOLOGIC HISTORY:  SOCIAL HISTORY: [name] is retired from [employer]. Her son [name] lives in [place] and has 3 sons; he works for a Pharmacist, community. A second son lives in [place] and has two children. Son [name] lives in [place] and has a daughter, as well as 2 sons by marriage.   ADVANCED DIRECTIVES:  HEALTH MAINTENANCE: History  Substance Use Topics  . Smoking status: Never Smoker   . Smokeless tobacco: Never Used  . Alcohol Use: No     Colonoscopy: due  PAP: December 2012/ [name]  Bone density: Due  Lipid panel: per Dr Drema Dallas  Allergies  Allergen Reactions  . Penicillins     "drops my heartbeat" "I just pass out"    Current Outpatient Prescriptions  Medication Sig Dispense Refill  . acetaminophen (TYLENOL) 500 MG tablet Take 500 mg by mouth every 6 (six) hours as needed for headache.    Marland Kitchen acyclovir (ZOVIRAX) 400 MG tablet Take 400 mg by mouth daily.    Marland Kitchen allopurinol (ZYLOPRIM) 300 MG tablet Take 300 mg by mouth daily.    Marland Kitchen aspirin 81 MG tablet Take 81 mg by mouth daily.    . Calcium Citrate (CITRACAL PO)  Take 4 tablets by mouth daily.     . cholecalciferol (VITAMIN D) 1000 UNITS tablet Take 4,000 Units by mouth daily.    Marland Kitchen dexamethasone (DECADRON) 4 MG tablet Take 4 mg by mouth 2 (two) times daily. After chemo    . Estradiol (ELESTRIN) 0.52 MG/0.87 GM (0.06%) GEL Apply 2 application topically See admin instructions. 2 pumps thinly to upper arm daily at bedtime    . HYDROcodone-acetaminophen (NORCO/VICODIN) 5-325 MG per tablet Take 1 tablet by mouth every 6 (six) hours as needed for moderate pain. 60 tablet 0  . lidocaine-prilocaine (EMLA) cream Apply 1 application topically as needed (for port). Apply nickel size amount to skin on top of  port. Cover with saran  Wrap at least one hour prior to access.    Marland Kitchen LORazepam (ATIVAN) 0.5 MG tablet Take 0.5 mg by mouth at bedtime as needed for anxiety.    . ondansetron (ZOFRAN) 4 MG tablet Take 4 mg by mouth 2 (two) times daily as needed for nausea or vomiting.    . progesterone (PROMETRIUM) 200 MG capsule Take 200 mg by mouth See admin instructions. on days 1-12 every other month    . traZODone (DESYREL) 50 MG tablet Take 50 mg by mouth at bedtime.     No current facility-administered medications for this visit.    OBJECTIVE: Middle-aged white woman who appears stated age 63 Vitals:   01/09/14 0838  BP: 94/52  Pulse: 76  Temp: 98.4 F (36.9 C)  Resp: 18   Filed Weights   01/09/14 0838  Weight: 118 lb 12.8 oz (53.887 kg)   Body mass index is 23.98 kg/(m^2).    ECOG FS: 1  Sclerae unicteric, pupils round and equal Oropharynx clear and slightly dry, teeth in good repair Bilateral cervical adenopathy, right greater than left, easily palpable, measuring up to 1-1/2 cm maximally; also right submandibular adenopathy. There is bilateral axillary adenopathy, but now much less pronounced. The right axillary lymph nodes are scarcely palpable. There once on the left are considerably softer than before. Lungs no rales or rhonchi Heart regular rate and rhythm Abd soft, nontender, positive bowel sounds,  Splenic tip barely palpable MSK no focal spinal tenderness Neuro: nonfocal, well oriented, appropriate affect Breasts: deferred     LAB RESULTS:  Lab Results  Component Value Date   WBC 50.9* 01/09/2014   NEUTROABS 3.2 01/09/2014   HGB 10.4* 01/09/2014   HCT 33.1* 01/09/2014   MCV 92.8 01/09/2014   PLT 153 01/09/2014      Chemistry      Component Value Date/Time   NA 139 12/30/2013 1325   NA 135* 06/04/2013 1130   K 3.6 12/30/2013 1325   K 4.1 06/04/2013 1130   CL 98 06/04/2013 1130   CL 101 12/15/2011 1322   CO2 25 12/30/2013 1325   CO2 24 06/04/2013 1130   BUN  9.7 12/30/2013 1325   BUN 9 06/04/2013 1130   CREATININE 0.8 12/30/2013 1325   CREATININE 0.61 06/04/2013 1130      Component Value Date/Time   CALCIUM 8.4 12/30/2013 1325   CALCIUM 9.0 06/04/2013 1130   ALKPHOS 104 12/30/2013 1325   ALKPHOS 112 06/04/2013 1130   AST 20 12/30/2013 1325   AST 19 06/04/2013 1130   ALT 19 12/30/2013 1325   ALT 22 06/04/2013 1130   BILITOT 0.32 12/30/2013 1325   BILITOT 0.5 06/04/2013 1130       STUDIES: Mr Jeri Cos TD Contrast  January 12, 2014  CLINICAL DATA:  Chronic lymphocytic leukemia. Headaches focus near the vertex. Swelling in the lymph glands.  EXAM: MRI HEAD WITHOUT AND WITH CONTRAST  TECHNIQUE: Multiplanar, multiecho pulse sequences of the brain and surrounding structures were obtained without and with intravenous contrast.  CONTRAST:  11 mL MultiHance  COMPARISON:  CT of the sinuses 04/30/2009  FINDINGS: Numerous bilateral level 2 and level 3 lymph nodes are noted bilaterally. Nodes on the right measure up to 16 mm. Left-sided nodes measure up to 20 mm. Although the nodes do not enhance, there is restricted diffusion within these nodes.  No acute intracranial abnormality is present. Scattered subcortical T2 hyperintensities are slightly greater than expected for age. There is no associated enhancement or mass lesion. No hemorrhage or acute infarct is present. The ventricles are of normal size.  Flow is present in the major intracranial arteries. The globes and orbits are intact. The paranasal sinuses and mastoid air cells are clear.  The postcontrast images demonstrate no parenchymal enhancement.  IMPRESSION: 1. Scattered subcortical T2 hyperintensities are slightly greater than expected for age. There is no associated enhancement. The finding is nonspecific but can be seen in the setting of chronic microvascular ischemia, a demyelinating process such as multiple sclerosis, vasculitis, complicated migraine headaches, or as the sequelae of a prior infectious  or inflammatory process. 2. Otherwise normal MRI of the brain for age. No acute or focal mass lesion is evident to suggest leukemia within the brain. 3. Enlarged bilateral level 2 and level 3 lymph nodes are present with restricted diffusion, concerning for residual or recurrent leukemia.   Electronically Signed   By: Lawrence Santiago M.D.   On: 01/04/2014 18:09     ASSESSMENT: 63 y.o.  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 She did have severe headaches and this is reflected in her brain MRI, which is otherwise normal. Interestingly her neck and submandibular lymph nodes increased of despite the treatment, while her peripheral lymphocyte count has dropped precipitously.  I am making no changes in her treatment and her second cycle will be likely first. I am hoping that we will see a decrease in her cervical and submandibular lymph nodes by cycle 3 area did otherwise we will have to consider switching to ibrutinib after cycle 4.  We have moved her third cycle 2 a Thursday because of family schedule issues.  [name] has a good understanding of the overall plan. She agrees with it. She knows the goal of treatment in her case is control. She will call with any problems that may develop before her next visit here.    01/09/2014   Chauncey Cruel, MD

## 2014-01-10 ENCOUNTER — Telehealth: Payer: Self-pay | Admitting: Oncology

## 2014-01-10 ENCOUNTER — Other Ambulatory Visit: Payer: Self-pay | Admitting: Oncology

## 2014-01-10 ENCOUNTER — Ambulatory Visit: Payer: Medicare Other

## 2014-01-10 ENCOUNTER — Other Ambulatory Visit: Payer: Self-pay | Admitting: *Deleted

## 2014-01-10 DIAGNOSIS — C9110 Chronic lymphocytic leukemia of B-cell type not having achieved remission: Secondary | ICD-10-CM

## 2014-01-10 NOTE — Progress Notes (Signed)
Per Dr.Magrinat, hold treatment for one week due to elevated AST 149, ALT 149, Alk Phos 158. Pt/CG aware. Val to call patient with new appointment.

## 2014-01-10 NOTE — Telephone Encounter (Signed)
s.w. pt and advised on nxt week appts....pt did not want to keep them...i forwarded to val

## 2014-01-11 ENCOUNTER — Ambulatory Visit: Payer: Medicare Other

## 2014-01-11 ENCOUNTER — Telehealth: Payer: Self-pay | Admitting: Adult Health

## 2014-01-11 NOTE — Telephone Encounter (Signed)
, °

## 2014-01-12 ENCOUNTER — Telehealth: Payer: Self-pay | Admitting: Oncology

## 2014-01-12 NOTE — Telephone Encounter (Signed)
lvm for pt regarding to NOV and DEC appt.... °

## 2014-01-16 ENCOUNTER — Ambulatory Visit: Payer: Medicare Other

## 2014-01-16 ENCOUNTER — Encounter: Payer: Self-pay | Admitting: Adult Health

## 2014-01-16 ENCOUNTER — Telehealth: Payer: Self-pay | Admitting: Adult Health

## 2014-01-16 ENCOUNTER — Ambulatory Visit (HOSPITAL_BASED_OUTPATIENT_CLINIC_OR_DEPARTMENT_OTHER): Payer: Medicare Other | Admitting: Adult Health

## 2014-01-16 ENCOUNTER — Other Ambulatory Visit (HOSPITAL_BASED_OUTPATIENT_CLINIC_OR_DEPARTMENT_OTHER): Payer: Medicare Other

## 2014-01-16 VITALS — BP 96/51 | HR 77 | Temp 97.7°F | Resp 18 | Ht 59.0 in | Wt 118.6 lb

## 2014-01-16 DIAGNOSIS — C9110 Chronic lymphocytic leukemia of B-cell type not having achieved remission: Secondary | ICD-10-CM | POA: Diagnosis not present

## 2014-01-16 DIAGNOSIS — R51 Headache: Secondary | ICD-10-CM | POA: Diagnosis not present

## 2014-01-16 DIAGNOSIS — R519 Headache, unspecified: Secondary | ICD-10-CM

## 2014-01-16 LAB — CBC WITH DIFFERENTIAL/PLATELET
BASO%: 0.3 % (ref 0.0–2.0)
BASOS ABS: 0.2 10*3/uL — AB (ref 0.0–0.1)
EOS ABS: 0.9 10*3/uL — AB (ref 0.0–0.5)
EOS%: 1.4 % (ref 0.0–7.0)
HCT: 35 % (ref 34.8–46.6)
HEMOGLOBIN: 11 g/dL — AB (ref 11.6–15.9)
LYMPH%: 91.5 % — AB (ref 14.0–49.7)
MCH: 29.7 pg (ref 25.1–34.0)
MCHC: 31.4 g/dL — ABNORMAL LOW (ref 31.5–36.0)
MCV: 94.4 fL (ref 79.5–101.0)
MONO#: 0.5 10*3/uL (ref 0.1–0.9)
MONO%: 0.7 % (ref 0.0–14.0)
NEUT%: 6.1 % — ABNORMAL LOW (ref 38.4–76.8)
NEUTROS ABS: 4.1 10*3/uL (ref 1.5–6.5)
PLATELETS: 194 10*3/uL (ref 145–400)
RBC: 3.71 10*6/uL (ref 3.70–5.45)
RDW: 14.7 % — ABNORMAL HIGH (ref 11.2–14.5)
WBC: 66.8 10*3/uL (ref 3.9–10.3)
lymph#: 61.2 10*3/uL — ABNORMAL HIGH (ref 0.9–3.3)

## 2014-01-16 LAB — COMPREHENSIVE METABOLIC PANEL (CC13)
ALBUMIN: 3.6 g/dL (ref 3.5–5.0)
ALK PHOS: 143 U/L (ref 40–150)
ALT: 41 U/L (ref 0–55)
AST: 32 U/L (ref 5–34)
Anion Gap: 7 mEq/L (ref 3–11)
BUN: 12.1 mg/dL (ref 7.0–26.0)
CO2: 27 mEq/L (ref 22–29)
Calcium: 9.1 mg/dL (ref 8.4–10.4)
Chloride: 106 mEq/L (ref 98–109)
Creatinine: 0.8 mg/dL (ref 0.6–1.1)
Glucose: 91 mg/dl (ref 70–140)
POTASSIUM: 4.5 meq/L (ref 3.5–5.1)
SODIUM: 140 meq/L (ref 136–145)
TOTAL PROTEIN: 5.9 g/dL — AB (ref 6.4–8.3)
Total Bilirubin: 0.31 mg/dL (ref 0.20–1.20)

## 2014-01-16 NOTE — Telephone Encounter (Signed)
per pof to r/s pt trmt-sent MW email to r/s-will call pt with new time once reply-pt understood

## 2014-01-16 NOTE — Progress Notes (Signed)
ID: Rachel [name]   DOB: 12/09/1950  MR#: [id number]  HQP#:[id number]  PCP: Osie Bond Md GYN: SU:  OTHER MD: [name] M.D.   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 She has been treated with Rituxan, ofatumumab, and cladribine, with details noted below.  INTERVAL HISTORY: Rachel [name] returns today for follow up of her chronic lymphoid leukemia accompanied by her husband and by her sister Verdis Frederickson, who will be with her for the next few days. Tomorrow is day 1 cycle 2 of 4-6 cycles of bendamustine/rituximab planned. She did not receive treatment last week due to elevated liver enzymes.  She is doing well today.  She wants to know if she can get her teeth cleaned next week.  She does have h/o diverticulitis and is concerned that it may be acting up.  She does have some mild left lower quadrant discomfort.  She has not had any diarrhea.  She also has occasional vaginal pain.  She denies any discharge, recent sexual intercourse, dysuria or any other concerns.  She tells me that she is going to make a gynecologic appointment.  She is not having any easy bruising recently which she is happy about.  She did have some mild headaches.  She hasn't taken any hydrocodone or even tylenol.  She tells me they have been much more mild in nature and short lived.  She tells me the "bumps" (lymph nodes) in her neck and behind her head are not as inflamed and tender, but much more mild.  She denies any recent fevers, chills, nausea, vomiting, constipation, diarrhea, numbness/tingling, skin/nail change or any further concerns.   REVIEW OF SYSTEMS: A 10 point review of systems was conducted and is otherwise negative except for what is noted above.     PAST MEDICAL HISTORY: Past Medical History  Diagnosis Date  . Cancer   . Leukemia   . Lower back pain     PAST SURGICAL HISTORY: Past Surgical History   Procedure Laterality Date  . Tonsillectomy    . Other surgical history      removal of uterine lining    FAMILY HISTORY Family History  Problem Relation Age of Onset  . Depression Son     GYNECOLOGIC HISTORY:  SOCIAL HISTORY: [name] is retired from [employer]. Her son [name] lives in [place] and has 3 sons; he works for a Pharmacist, community. A second son lives in [place] and has two children. Son [name] lives in [place] and has a daughter, as well as 2 sons by marriage.   ADVANCED DIRECTIVES:  HEALTH MAINTENANCE: History  Substance Use Topics  . Smoking status: Never Smoker   . Smokeless tobacco: Never Used  . Alcohol Use: No     Colonoscopy: due  PAP: December 2012/ [name]  Bone density: Due  Lipid panel: per Dr Drema Dallas  Allergies  Allergen Reactions  . Penicillins     "drops my heartbeat" "I just pass out"    Current Outpatient Prescriptions  Medication Sig Dispense Refill  . acetaminophen (TYLENOL) 500 MG tablet Take 500 mg by mouth every 6 (six) hours as needed for headache.    Marland Kitchen acyclovir (ZOVIRAX) 400 MG tablet Take 400 mg by mouth daily.    Marland Kitchen allopurinol (ZYLOPRIM) 300 MG tablet Take 300 mg by mouth daily.    Marland Kitchen aspirin 81 MG tablet Take 81 mg by mouth daily.    . Calcium  Citrate (CITRACAL PO) Take 4 tablets by mouth daily.     . cholecalciferol (VITAMIN D) 1000 UNITS tablet Take 4,000 Units by mouth daily.    Marland Kitchen dexamethasone (DECADRON) 4 MG tablet Take 4 mg by mouth 2 (two) times daily. After chemo    . Estradiol (ELESTRIN) 0.52 MG/0.87 GM (0.06%) GEL Apply 2 application topically See admin instructions. 2 pumps thinly to upper arm daily at bedtime    . HYDROcodone-acetaminophen (NORCO/VICODIN) 5-325 MG per tablet Take 1 tablet by mouth every 6 (six) hours as needed for moderate pain. 60 tablet 0  . lidocaine-prilocaine (EMLA) cream Apply 1 application topically as needed (for port). Apply nickel size amount to skin on top of port. Cover with  saran  Wrap at least one hour prior to access.    Marland Kitchen LORazepam (ATIVAN) 0.5 MG tablet Take 0.5 mg by mouth at bedtime as needed for anxiety.    . ondansetron (ZOFRAN) 4 MG tablet Take 4 mg by mouth 2 (two) times daily as needed for nausea or vomiting.    . progesterone (PROMETRIUM) 200 MG capsule Take 200 mg by mouth See admin instructions. on days 1-12 every other month    . traZODone (DESYREL) 50 MG tablet Take 50 mg by mouth at bedtime.     No current facility-administered medications for this visit.    OBJECTIVE: Middle-aged white woman who appears stated age 2 Vitals:   01/16/14 0921  BP: 96/51  Pulse: 77  Temp: 97.7 F (36.5 C)  Resp: 18   Filed Weights   01/16/14 0921  Weight: 118 lb 9.6 oz (53.797 kg)   Body mass index is 23.94 kg/(m^2).    ECOG FS: 1  GENERAL: Patient is a well appearing female in no acute distress HEENT:  Sclerae anicteric.  Oropharynx clear and moist. No ulcerations or evidence of oropharyngeal candidiasis. Neck is supple.  NODES:  No supraclavicular, or axillary lymphadenopathy palpated. + Cervical adenopathy bilaterally, non-tender LUNGS:  Clear to auscultation bilaterally.  No wheezes or rhonchi. HEART:  Regular rate and rhythm. No murmur appreciated. ABDOMEN:  Soft, nontender.  Positive, normoactive bowel sounds. No organomegaly palpated. MSK:  No focal spinal tenderness to palpation. Full range of motion bilaterally in the upper extremities. EXTREMITIES:  No peripheral edema.   SKIN:  Clear with no obvious rashes or skin changes. No nail dyscrasia. NEURO:  Nonfocal. Well oriented.  Appropriate affect.       LAB RESULTS:  Lab Results  Component Value Date   WBC 50.9* 01/09/2014   NEUTROABS 3.2 01/09/2014   HGB 10.4* 01/09/2014   HCT 33.1* 01/09/2014   MCV 92.8 01/09/2014   PLT 153 01/09/2014      Chemistry      Component Value Date/Time   NA 141 01/09/2014 0818   NA 135* 06/04/2013 1130   K 4.0 01/09/2014 0818   K 4.1  06/04/2013 1130   CL 98 06/04/2013 1130   CL 101 12/15/2011 1322   CO2 27 01/09/2014 0818   CO2 24 06/04/2013 1130   BUN 11.8 01/09/2014 0818   BUN 9 06/04/2013 1130   CREATININE 0.8 01/09/2014 0818   CREATININE 0.61 06/04/2013 1130      Component Value Date/Time   CALCIUM 9.2 01/09/2014 0818   CALCIUM 9.0 06/04/2013 1130   ALKPHOS 158* 01/09/2014 0818   ALKPHOS 112 06/04/2013 1130   AST 149* 01/09/2014 0818   AST 19 06/04/2013 1130   ALT 149* 01/09/2014 0818   ALT 22  06/04/2013 1130   BILITOT 0.40 01/09/2014 0818   BILITOT 0.5 06/04/2013 1130       STUDIES: Mr Jeri Cos UR Contrast  Jan 13, 2014   CLINICAL DATA:  Chronic lymphocytic leukemia. Headaches focus near the vertex. Swelling in the lymph glands.  EXAM: MRI HEAD WITHOUT AND WITH CONTRAST  TECHNIQUE: Multiplanar, multiecho pulse sequences of the brain and surrounding structures were obtained without and with intravenous contrast.  CONTRAST:  11 mL MultiHance  COMPARISON:  CT of the sinuses 04/30/2009  FINDINGS: Numerous bilateral level 2 and level 3 lymph nodes are noted bilaterally. Nodes on the right measure up to 16 mm. Left-sided nodes measure up to 20 mm. Although the nodes do not enhance, there is restricted diffusion within these nodes.  No acute intracranial abnormality is present. Scattered subcortical T2 hyperintensities are slightly greater than expected for age. There is no associated enhancement or mass lesion. No hemorrhage or acute infarct is present. The ventricles are of normal size.  Flow is present in the major intracranial arteries. The globes and orbits are intact. The paranasal sinuses and mastoid air cells are clear.  The postcontrast images demonstrate no parenchymal enhancement.  IMPRESSION: 1. Scattered subcortical T2 hyperintensities are slightly greater than expected for age. There is no associated enhancement. The finding is nonspecific but can be seen in the setting of chronic microvascular ischemia, a  demyelinating process such as multiple sclerosis, vasculitis, complicated migraine headaches, or as the sequelae of a prior infectious or inflammatory process. 2. Otherwise normal MRI of the brain for age. No acute or focal mass lesion is evident to suggest leukemia within the brain. 3. Enlarged bilateral level 2 and level 3 lymph nodes are present with restricted diffusion, concerning for residual or recurrent leukemia.   Electronically Signed   By: Lawrence Santiago M.D.   On: 01/13/2014 18:09     ASSESSMENT: 63 y.o.  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  Her liver enzymes are now normal.  I think this is due to the increased amounts of tylenol and vicodin that she was taking for her headaches, since they did decrease once she stopped taking them.  She is not going to take them any longer unless she feels she absolutely needs them.    Linnet was cleared to have her teeth cleaned on 01/23/14 and I wrote a letter of clearance to her dentist.  Her [place] and platelets are normal today.   Landry will return on 01/24/14 for labs and to proceed with cycle two of R-Bendamustine.  She will then return a week later for labs and evaluation of chemo toxicities.   Per Dr. Virgie Dad last note:   "I am making no changes in her treatment and her second cycle will be likely first. I am hoping that we will see a decrease in her cervical and submandibular lymph nodes by cycle 3 area did otherwise we will  have to consider switching to ibrutinib after cycle 4."  Rachel [name] has a good understanding of the overall plan. She agrees with it. She  knows the goal of treatment in her case is control. She will call with any problems that may develop before her next visit here.  Minette Headland, [place] [phone]  01/16/2014

## 2014-01-17 ENCOUNTER — Ambulatory Visit: Payer: Medicare Other

## 2014-01-17 ENCOUNTER — Telehealth: Payer: Self-pay | Admitting: Oncology

## 2014-01-17 NOTE — Telephone Encounter (Signed)
cld pt & left message in re to changing  trmts to 12/3&12/4. Per MW those were only 2 days she has avaiable-adv pt to call if she wanted r/s dates. Per GM ok tomove to those dates. Will not proceed until pt calls back

## 2014-01-18 ENCOUNTER — Telehealth: Payer: Self-pay | Admitting: *Deleted

## 2014-01-18 ENCOUNTER — Telehealth: Payer: Self-pay | Admitting: Oncology

## 2014-01-18 NOTE — Telephone Encounter (Signed)
Per staff message and POF I have scheduled appts. Advised scheduler of appts. JMW  

## 2014-01-18 NOTE — Telephone Encounter (Signed)
cld pt & left pt a message in re to appts times & dates

## 2014-01-23 ENCOUNTER — Other Ambulatory Visit: Payer: Medicare Other

## 2014-01-24 ENCOUNTER — Other Ambulatory Visit (HOSPITAL_BASED_OUTPATIENT_CLINIC_OR_DEPARTMENT_OTHER): Payer: Medicare Other

## 2014-01-24 ENCOUNTER — Other Ambulatory Visit: Payer: Medicare Other

## 2014-01-24 DIAGNOSIS — C9110 Chronic lymphocytic leukemia of B-cell type not having achieved remission: Secondary | ICD-10-CM

## 2014-01-24 LAB — CBC WITH DIFFERENTIAL/PLATELET
BASO%: 0.8 % (ref 0.0–2.0)
Basophils Absolute: 0.6 10*3/uL — ABNORMAL HIGH (ref 0.0–0.1)
EOS ABS: 0.8 10*3/uL — AB (ref 0.0–0.5)
EOS%: 1.1 % (ref 0.0–7.0)
HCT: 35.2 % (ref 34.8–46.6)
HGB: 10.9 g/dL — ABNORMAL LOW (ref 11.6–15.9)
LYMPH%: 92.1 % — AB (ref 14.0–49.7)
MCH: 29.4 pg (ref 25.1–34.0)
MCHC: 31 g/dL — ABNORMAL LOW (ref 31.5–36.0)
MCV: 94.7 fL (ref 79.5–101.0)
MONO#: 0.7 10*3/uL (ref 0.1–0.9)
MONO%: 0.9 % (ref 0.0–14.0)
NEUT#: 3.7 10*3/uL (ref 1.5–6.5)
NEUT%: 5.1 % — AB (ref 38.4–76.8)
PLATELETS: 135 10*3/uL — AB (ref 145–400)
RBC: 3.72 10*6/uL (ref 3.70–5.45)
RDW: 14.6 % — ABNORMAL HIGH (ref 11.2–14.5)
WBC: 72.7 10*3/uL (ref 3.9–10.3)
lymph#: 67 10*3/uL — ABNORMAL HIGH (ref 0.9–3.3)

## 2014-01-24 LAB — COMPREHENSIVE METABOLIC PANEL (CC13)
ALK PHOS: 122 U/L (ref 40–150)
ALT: 18 U/L (ref 0–55)
AST: 23 U/L (ref 5–34)
Albumin: 3.8 g/dL (ref 3.5–5.0)
Anion Gap: 8 mEq/L (ref 3–11)
BILIRUBIN TOTAL: 0.35 mg/dL (ref 0.20–1.20)
BUN: 19 mg/dL (ref 7.0–26.0)
CO2: 28 mEq/L (ref 22–29)
CREATININE: 0.9 mg/dL (ref 0.6–1.1)
Calcium: 9.5 mg/dL (ref 8.4–10.4)
Chloride: 104 mEq/L (ref 98–109)
GLUCOSE: 76 mg/dL (ref 70–140)
Potassium: 4.4 mEq/L (ref 3.5–5.1)
Sodium: 140 mEq/L (ref 136–145)
Total Protein: 6.3 g/dL — ABNORMAL LOW (ref 6.4–8.3)

## 2014-01-24 LAB — TECHNOLOGIST REVIEW

## 2014-01-25 ENCOUNTER — Ambulatory Visit: Payer: Medicare Other

## 2014-01-26 ENCOUNTER — Other Ambulatory Visit: Payer: Self-pay | Admitting: Emergency Medicine

## 2014-01-26 ENCOUNTER — Ambulatory Visit: Payer: Medicare Other

## 2014-01-26 ENCOUNTER — Ambulatory Visit (HOSPITAL_BASED_OUTPATIENT_CLINIC_OR_DEPARTMENT_OTHER): Payer: Medicare Other

## 2014-01-26 ENCOUNTER — Other Ambulatory Visit: Payer: Medicare Other

## 2014-01-26 DIAGNOSIS — Z5111 Encounter for antineoplastic chemotherapy: Secondary | ICD-10-CM

## 2014-01-26 DIAGNOSIS — C9110 Chronic lymphocytic leukemia of B-cell type not having achieved remission: Secondary | ICD-10-CM | POA: Diagnosis not present

## 2014-01-26 DIAGNOSIS — Z5112 Encounter for antineoplastic immunotherapy: Secondary | ICD-10-CM | POA: Diagnosis not present

## 2014-01-26 MED ORDER — SODIUM CHLORIDE 0.9 % IJ SOLN
10.0000 mL | INTRAMUSCULAR | Status: DC | PRN
  Administered 2014-01-26: 10 mL
  Filled 2014-01-26: qty 10

## 2014-01-26 MED ORDER — DEXAMETHASONE SODIUM PHOSPHATE 10 MG/ML IJ SOLN
INTRAMUSCULAR | Status: AC
  Filled 2014-01-26: qty 1

## 2014-01-26 MED ORDER — DEXAMETHASONE SODIUM PHOSPHATE 10 MG/ML IJ SOLN
10.0000 mg | Freq: Once | INTRAMUSCULAR | Status: AC
  Administered 2014-01-26: 10 mg via INTRAVENOUS

## 2014-01-26 MED ORDER — SODIUM CHLORIDE 0.9 % IV SOLN
Freq: Once | INTRAVENOUS | Status: AC
  Administered 2014-01-26: 10:00:00 via INTRAVENOUS

## 2014-01-26 MED ORDER — SODIUM CHLORIDE 0.9 % IV SOLN
375.0000 mg/m2 | Freq: Once | INTRAVENOUS | Status: AC
  Administered 2014-01-26: 600 mg via INTRAVENOUS
  Filled 2014-01-26: qty 60

## 2014-01-26 MED ORDER — DIPHENHYDRAMINE HCL 25 MG PO CAPS
ORAL_CAPSULE | ORAL | Status: AC
  Filled 2014-01-26: qty 1

## 2014-01-26 MED ORDER — ONDANSETRON 8 MG/50ML IVPB (CHCC)
8.0000 mg | Freq: Once | INTRAVENOUS | Status: AC
  Administered 2014-01-26: 8 mg via INTRAVENOUS

## 2014-01-26 MED ORDER — DIPHENHYDRAMINE HCL 25 MG PO CAPS
25.0000 mg | ORAL_CAPSULE | Freq: Once | ORAL | Status: AC
  Administered 2014-01-26: 25 mg via ORAL

## 2014-01-26 MED ORDER — ONDANSETRON 8 MG/NS 50 ML IVPB
INTRAVENOUS | Status: AC
  Filled 2014-01-26: qty 8

## 2014-01-26 MED ORDER — ACETAMINOPHEN 325 MG PO TABS
650.0000 mg | ORAL_TABLET | Freq: Once | ORAL | Status: AC
  Administered 2014-01-26: 650 mg via ORAL

## 2014-01-26 MED ORDER — SODIUM CHLORIDE 0.9 % IV SOLN
90.0000 mg/m2 | Freq: Once | INTRAVENOUS | Status: AC
  Administered 2014-01-26: 135 mg via INTRAVENOUS
  Filled 2014-01-26: qty 1.5

## 2014-01-26 MED ORDER — ACETAMINOPHEN 325 MG PO TABS
ORAL_TABLET | ORAL | Status: AC
Start: 2014-01-26 — End: 2014-01-26
  Filled 2014-01-26: qty 2

## 2014-01-26 MED ORDER — HEPARIN SOD (PORK) LOCK FLUSH 100 UNIT/ML IV SOLN
500.0000 [IU] | Freq: Once | INTRAVENOUS | Status: AC | PRN
  Administered 2014-01-26: 500 [IU]
  Filled 2014-01-26: qty 5

## 2014-01-26 NOTE — Patient Instructions (Signed)
[facility] Discharge Instructions for Patients Receiving Chemotherapy  Today you received the following chemotherapy agents Rituxan/Treanda.   To help prevent nausea and vomiting after your treatment, we encourage you to take your nausea medication as directed.    If you develop nausea and vomiting that is not controlled by your nausea medication, call the clinic.   BELOW ARE SYMPTOMS THAT SHOULD BE REPORTED IMMEDIATELY:  *FEVER GREATER THAN 100.5 F  *CHILLS WITH OR WITHOUT FEVER  NAUSEA AND VOMITING THAT IS NOT CONTROLLED WITH YOUR NAUSEA MEDICATION  *UNUSUAL SHORTNESS OF BREATH  *UNUSUAL BRUISING OR BLEEDING  TENDERNESS IN MOUTH AND THROAT WITH OR WITHOUT PRESENCE OF ULCERS  *URINARY PROBLEMS  *BOWEL PROBLEMS  UNUSUAL RASH Items with * indicate a potential emergency and should be followed up as soon as possible.  Feel free to call the clinic you have any questions or concerns. The clinic phone number is [phone].    

## 2014-01-27 ENCOUNTER — Ambulatory Visit (HOSPITAL_BASED_OUTPATIENT_CLINIC_OR_DEPARTMENT_OTHER): Payer: Medicare Other

## 2014-01-27 ENCOUNTER — Ambulatory Visit: Payer: Medicare Other

## 2014-01-27 DIAGNOSIS — C9110 Chronic lymphocytic leukemia of B-cell type not having achieved remission: Secondary | ICD-10-CM | POA: Diagnosis not present

## 2014-01-27 DIAGNOSIS — Z5111 Encounter for antineoplastic chemotherapy: Secondary | ICD-10-CM

## 2014-01-27 MED ORDER — SODIUM CHLORIDE 0.9 % IV SOLN
90.0000 mg/m2 | Freq: Once | INTRAVENOUS | Status: AC
  Administered 2014-01-27: 135 mg via INTRAVENOUS
  Filled 2014-01-27: qty 1.5

## 2014-01-27 MED ORDER — ONDANSETRON 8 MG/50ML IVPB (CHCC)
8.0000 mg | Freq: Once | INTRAVENOUS | Status: AC
  Administered 2014-01-27: 8 mg via INTRAVENOUS

## 2014-01-27 MED ORDER — SODIUM CHLORIDE 0.9 % IV SOLN
Freq: Once | INTRAVENOUS | Status: AC
Start: 2014-01-27 — End: 2014-01-27
  Administered 2014-01-27: 14:00:00 via INTRAVENOUS

## 2014-01-27 MED ORDER — ONDANSETRON 8 MG/NS 50 ML IVPB
INTRAVENOUS | Status: AC
  Filled 2014-01-27: qty 8

## 2014-01-27 MED ORDER — HEPARIN SOD (PORK) LOCK FLUSH 100 UNIT/ML IV SOLN
500.0000 [IU] | Freq: Once | INTRAVENOUS | Status: AC | PRN
  Administered 2014-01-27: 500 [IU]
  Filled 2014-01-27: qty 5

## 2014-01-27 MED ORDER — SODIUM CHLORIDE 0.9 % IJ SOLN
10.0000 mL | INTRAMUSCULAR | Status: DC | PRN
  Administered 2014-01-27: 10 mL
  Filled 2014-01-27: qty 10

## 2014-01-27 MED ORDER — DEXAMETHASONE SODIUM PHOSPHATE 10 MG/ML IJ SOLN
INTRAMUSCULAR | Status: AC
  Filled 2014-01-27: qty 1

## 2014-01-27 MED ORDER — DEXAMETHASONE SODIUM PHOSPHATE 10 MG/ML IJ SOLN
10.0000 mg | Freq: Once | INTRAMUSCULAR | Status: AC
  Administered 2014-01-27: 10 mg via INTRAVENOUS

## 2014-01-27 NOTE — Patient Instructions (Signed)
[facility] Discharge Instructions for Patients Receiving Chemotherapy  Today you received the following chemotherapy agents: Treanda.  To help prevent nausea and vomiting after your treatment, we encourage you to take your nausea medication as prescribed.   If you develop nausea and vomiting that is not controlled by your nausea medication, call the clinic.   BELOW ARE SYMPTOMS THAT SHOULD BE REPORTED IMMEDIATELY:  *FEVER GREATER THAN 100.5 F  *CHILLS WITH OR WITHOUT FEVER  NAUSEA AND VOMITING THAT IS NOT CONTROLLED WITH YOUR NAUSEA MEDICATION  *UNUSUAL SHORTNESS OF BREATH  *UNUSUAL BRUISING OR BLEEDING  TENDERNESS IN MOUTH AND THROAT WITH OR WITHOUT PRESENCE OF ULCERS  *URINARY PROBLEMS  *BOWEL PROBLEMS  UNUSUAL RASH Items with * indicate a potential emergency and should be followed up as soon as possible.  Feel free to call the clinic you have any questions or concerns. The clinic phone number is [phone].    

## 2014-01-30 ENCOUNTER — Other Ambulatory Visit: Payer: Self-pay | Admitting: *Deleted

## 2014-01-30 DIAGNOSIS — R112 Nausea with vomiting, unspecified: Secondary | ICD-10-CM | POA: Diagnosis not present

## 2014-01-30 DIAGNOSIS — R6883 Chills (without fever): Secondary | ICD-10-CM | POA: Diagnosis not present

## 2014-01-30 DIAGNOSIS — K219 Gastro-esophageal reflux disease without esophagitis: Secondary | ICD-10-CM | POA: Diagnosis not present

## 2014-01-31 ENCOUNTER — Ambulatory Visit: Payer: Medicare Other | Admitting: Hematology and Oncology

## 2014-01-31 ENCOUNTER — Ambulatory Visit (INDEPENDENT_AMBULATORY_CARE_PROVIDER_SITE_OTHER): Payer: Medicare Other | Admitting: Obstetrics

## 2014-01-31 VITALS — BP 99/64 | HR 70 | Wt 116.0 lb

## 2014-01-31 DIAGNOSIS — N898 Other specified noninflammatory disorders of vagina: Secondary | ICD-10-CM | POA: Diagnosis not present

## 2014-01-31 DIAGNOSIS — Z124 Encounter for screening for malignant neoplasm of cervix: Secondary | ICD-10-CM | POA: Diagnosis not present

## 2014-01-31 DIAGNOSIS — R87612 Low grade squamous intraepithelial lesion on cytologic smear of cervix (LGSIL): Secondary | ICD-10-CM | POA: Diagnosis not present

## 2014-02-01 ENCOUNTER — Encounter: Payer: Self-pay | Admitting: Obstetrics

## 2014-02-01 ENCOUNTER — Encounter: Payer: Self-pay | Admitting: Nurse Practitioner

## 2014-02-01 ENCOUNTER — Ambulatory Visit (HOSPITAL_BASED_OUTPATIENT_CLINIC_OR_DEPARTMENT_OTHER): Payer: Medicare Other | Admitting: Nurse Practitioner

## 2014-02-01 ENCOUNTER — Other Ambulatory Visit (HOSPITAL_BASED_OUTPATIENT_CLINIC_OR_DEPARTMENT_OTHER): Payer: Medicare Other

## 2014-02-01 ENCOUNTER — Telehealth: Payer: Self-pay | Admitting: Nurse Practitioner

## 2014-02-01 VITALS — BP 111/68 | HR 71 | Temp 97.6°F | Resp 18 | Ht 59.0 in | Wt 116.5 lb

## 2014-02-01 DIAGNOSIS — C9110 Chronic lymphocytic leukemia of B-cell type not having achieved remission: Secondary | ICD-10-CM

## 2014-02-01 LAB — CBC WITH DIFFERENTIAL/PLATELET
BASO%: 0.2 % (ref 0.0–2.0)
Basophils Absolute: 0.1 10*3/uL (ref 0.0–0.1)
EOS%: 0.2 % (ref 0.0–7.0)
Eosinophils Absolute: 0.1 10*3/uL (ref 0.0–0.5)
HCT: 35.4 % (ref 34.8–46.6)
HGB: 11 g/dL — ABNORMAL LOW (ref 11.6–15.9)
LYMPH#: 56.3 10*3/uL — AB (ref 0.9–3.3)
LYMPH%: 96.3 % — ABNORMAL HIGH (ref 14.0–49.7)
MCH: 29.5 pg (ref 25.1–34.0)
MCHC: 31.1 g/dL — ABNORMAL LOW (ref 31.5–36.0)
MCV: 94.6 fL (ref 79.5–101.0)
MONO#: 0.6 10*3/uL (ref 0.1–0.9)
MONO%: 1 % (ref 0.0–14.0)
NEUT#: 1.3 10*3/uL — ABNORMAL LOW (ref 1.5–6.5)
NEUT%: 2.3 % — ABNORMAL LOW (ref 38.4–76.8)
Platelets: 120 10*3/uL — ABNORMAL LOW (ref 145–400)
RBC: 3.74 10*6/uL (ref 3.70–5.45)
RDW: 14.7 % — ABNORMAL HIGH (ref 11.2–14.5)
WBC: 58.5 10*3/uL — AB (ref 3.9–10.3)

## 2014-02-01 LAB — COMPREHENSIVE METABOLIC PANEL (CC13)
ALBUMIN: 3.9 g/dL (ref 3.5–5.0)
ALT: 21 U/L (ref 0–55)
ANION GAP: 12 meq/L — AB (ref 3–11)
AST: 12 U/L (ref 5–34)
Alkaline Phosphatase: 108 U/L (ref 40–150)
BUN: 14.7 mg/dL (ref 7.0–26.0)
CALCIUM: 9.5 mg/dL (ref 8.4–10.4)
CHLORIDE: 102 meq/L (ref 98–109)
CO2: 26 meq/L (ref 22–29)
CREATININE: 0.9 mg/dL (ref 0.6–1.1)
EGFR: 69 mL/min/{1.73_m2} — ABNORMAL LOW (ref >90)
Glucose: 97 mg/dl (ref 70–140)
POTASSIUM: 4.4 meq/L (ref 3.5–5.1)
Sodium: 140 mEq/L (ref 136–145)
Total Bilirubin: 0.26 mg/dL (ref 0.20–1.20)
Total Protein: 6.4 g/dL (ref 6.4–8.3)

## 2014-02-01 LAB — TECHNOLOGIST REVIEW

## 2014-02-01 NOTE — Progress Notes (Signed)
ID: Stephen Turnbaugh [place]   DOB: 1950-03-11  MR#: [id number]  FKC#:[id number]  PCP: Osie Bond Md GYN: SU:  OTHER MD: [name] M.D.   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 She has been treated with Rituxan, ofatumumab, and cladribine, with details noted below.  INTERVAL HISTORY: Rachel [name] returns today for follow up of her chronic lymphoid leukemia. Today is day 7, cycle 2 of 4-6 planned cycles of bendamustine/rituximab. She is doing better with this cycle. She denies fevers or chills. She has some constipation but she remedies this by eating fibrous fruits. She has some short lived nausea and is taking zofran. She was also started on omeprazole because some of this nausea is likely related to reflux. She is battling sinus drainage that was determined to be viral by her PCP. Every now and then it "tickles" her throat and causes her to cough. She denies shortness of breath, chest pain, palpitations, or fatigue. She has no mouth sores, rashes, or peripheral neuropathy symptoms. She continues to have mild headaches but does not need to take any tylenol or hydrocodone. The cervical lymph nodes continue to be mildly tender but are no where near as inflamed. She denies night sweats, weight changes, or dizziness.   REVIEW OF SYSTEMS: A detailed review of systems was conducted and is otherwise negative except for what is noted above.     PAST MEDICAL HISTORY: Past Medical History  Diagnosis Date  . Cancer   . Leukemia   . Lower back pain     PAST SURGICAL HISTORY: Past Surgical History  Procedure Laterality Date  . Tonsillectomy    . Other surgical history      removal of uterine lining    FAMILY HISTORY Family History  Problem Relation Age of Onset  . Depression Son     GYNECOLOGIC HISTORY:  SOCIAL HISTORY: [name] is retired from [employer]. Her son [name] lives in [place] and has 3 sons; he works for a Pharmacist, community. A second son lives in [place] and has two children. Son [name] lives in [place] and has a daughter, as well as 2 sons by marriage.   ADVANCED DIRECTIVES:  HEALTH MAINTENANCE: History  Substance Use Topics  . Smoking status: Never Smoker   . Smokeless tobacco: Never Used  . Alcohol Use: No     Colonoscopy: due  PAP: December 2012/ [name]  Bone density: Due  Lipid panel: per Dr Drema Dallas  Allergies  Allergen Reactions  . Penicillins     "drops my heartbeat" "I just pass out"    Current Outpatient Prescriptions  Medication Sig Dispense Refill  . acyclovir (ZOVIRAX) 400 MG tablet Take 400 mg by mouth daily.    Marland Kitchen allopurinol (ZYLOPRIM) 300 MG tablet Take 300 mg by mouth daily.    Marland Kitchen aspirin 81 MG tablet Take 81 mg by mouth daily.    . Calcium Citrate (CITRACAL PO) Take 4 tablets by mouth daily.     . cholecalciferol (VITAMIN D) 1000 UNITS tablet Take 4,000 Units by mouth daily.    Marland Kitchen dexamethasone (DECADRON) 4 MG tablet Take 4 mg by mouth 2 (two) times daily. After chemo    . Estradiol (ELESTRIN) 0.52 MG/0.87 GM (0.06%) GEL Apply 2 application topically See admin instructions. 2 pumps thinly to upper arm daily at bedtime    . lidocaine-prilocaine (EMLA) cream Apply 1 application topically as needed (for port). Apply nickel  size amount to skin on top of port. Cover with saran  Wrap at least one hour prior to access.    Marland Kitchen omeprazole (PRILOSEC) 20 MG capsule Take 20 mg by mouth daily.    . ondansetron (ZOFRAN) 4 MG tablet Take 4 mg by mouth 2 (two) times daily as needed for nausea or vomiting.    . progesterone (PROMETRIUM) 200 MG capsule Take 200 mg by mouth See admin instructions. on days 1-12 every other month    . traZODone (DESYREL) 50 MG tablet Take 50 mg by mouth at bedtime.    Marland Kitchen acetaminophen (TYLENOL) 500 MG tablet Take 500 mg by mouth every 6 (six) hours as needed for headache.    Marland Kitchen HYDROcodone-acetaminophen  (NORCO/VICODIN) 5-325 MG per tablet Take 1 tablet by mouth every 6 (six) hours as needed for moderate pain. (Patient not taking: Reported on 02/01/2014) 60 tablet 0  . LORazepam (ATIVAN) 0.5 MG tablet Take 0.5 mg by mouth at bedtime as needed for anxiety.     No current facility-administered medications for this visit.    OBJECTIVE: Middle-aged white woman who appears stated age 63 Vitals:   02/01/14 1404  BP: 111/68  Pulse: 71  Temp: 97.6 F (36.4 C)  Resp: 18   Filed Weights   02/01/14 1404  Weight: 116 lb 8 oz (52.844 kg)   Body mass index is 23.52 kg/(m^2).    ECOG FS: 1  Skin: warm, dry  HEENT: sclerae anicteric, conjunctivae pink, oropharynx clear. No thrush or mucositis.  Lymph Nodes: barely palpable cervical adenopathy bilaterally but tender, no supraclavicular or axillar lymphadenopathy  Lungs: clear to auscultation bilaterally, no rales, wheezes, or rhonci  Heart: regular rate and rhythm  Abdomen: round, soft, non tender, positive bowel sounds  Musculoskeletal: No focal spinal tenderness, no peripheral edema  Neuro: non focal, well oriented, positive affect   LAB RESULTS:  Lab Results  Component Value Date   WBC 58.5* 02/01/2014   NEUTROABS 1.3* 02/01/2014   HGB 11.0* 02/01/2014   HCT 35.4 02/01/2014   MCV 94.6 02/01/2014   PLT 120* 02/01/2014      Chemistry      Component Value Date/Time   NA 140 01/24/2014 0906   NA 135* 06/04/2013 1130   K 4.4 01/24/2014 0906   K 4.1 06/04/2013 1130   CL 98 06/04/2013 1130   CL 101 12/15/2011 1322   CO2 28 01/24/2014 0906   CO2 24 06/04/2013 1130   BUN 19.0 01/24/2014 0906   BUN 9 06/04/2013 1130   CREATININE 0.9 01/24/2014 0906   CREATININE 0.61 06/04/2013 1130      Component Value Date/Time   CALCIUM 9.5 01/24/2014 0906   CALCIUM 9.0 06/04/2013 1130   ALKPHOS 122 01/24/2014 0906   ALKPHOS 112 06/04/2013 1130   AST 23 01/24/2014 0906   AST 19 06/04/2013 1130   ALT 18 01/24/2014 0906   ALT 22 06/04/2013  1130   BILITOT 0.35 01/24/2014 0906   BILITOT 0.5 06/04/2013 1130       STUDIES: Mr Jeri Cos Wo Contrast  01-09-2014   CLINICAL DATA:  Chronic lymphocytic leukemia. Headaches focus near the vertex. Swelling in the lymph glands.  EXAM: MRI HEAD WITHOUT AND WITH CONTRAST  TECHNIQUE: Multiplanar, multiecho pulse sequences of the brain and surrounding structures were obtained without and with intravenous contrast.  CONTRAST:  11 mL MultiHance  COMPARISON:  CT of the sinuses 04/30/2009  FINDINGS: Numerous bilateral level 2 and level 3 lymph nodes are  noted bilaterally. Nodes on the right measure up to 16 mm. Left-sided nodes measure up to 20 mm. Although the nodes do not enhance, there is restricted diffusion within these nodes.  No acute intracranial abnormality is present. Scattered subcortical T2 hyperintensities are slightly greater than expected for age. There is no associated enhancement or mass lesion. No hemorrhage or acute infarct is present. The ventricles are of normal size.  Flow is present in the major intracranial arteries. The globes and orbits are intact. The paranasal sinuses and mastoid air cells are clear.  The postcontrast images demonstrate no parenchymal enhancement.  IMPRESSION: 1. Scattered subcortical T2 hyperintensities are slightly greater than expected for age. There is no associated enhancement. The finding is nonspecific but can be seen in the setting of chronic microvascular ischemia, a demyelinating process such as multiple sclerosis, vasculitis, complicated migraine headaches, or as the sequelae of a prior infectious or inflammatory process. 2. Otherwise normal MRI of the brain for age. No acute or focal mass lesion is evident to suggest leukemia within the brain. 3. Enlarged bilateral level 2 and level 3 lymph nodes are present with restricted diffusion, concerning for residual or recurrent leukemia.   Electronically Signed   By: Lawrence Santiago M.D.   On: 01/04/2014 18:09      ASSESSMENT: 63 y.o.  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 The labs were reviewed in detail. Her WBC count is trending down. Her hgb and platelets are relatively stable. The CMET was not yet available to review.   Rachel [name] will continue the zofran and omperazole for her dyspepsia. She is treating her sinus issues symptomatically, using cough drops as necessary.   Rachel [name] will return on 12/17 for labs, and 12/31 for a follow up visit with Dr. Jana Hakim. She prefers not to start cycle 3 of bendamustine/rituxamib until after the 1st of the year. She understands and agrees with this plan. She has been encouraged to call with any issues that might arise before her next visit here.   Marcelino Duster, [place] [phone]  02/01/2014

## 2014-02-01 NOTE — Telephone Encounter (Signed)
per pof to sch pt appt-per GM pt wanted to chge labs to 12/17-CX 14 & 28

## 2014-02-01 NOTE — Progress Notes (Signed)
Patient ID: Rachel [name], female   DOB: [birth year], 63 y.o.   MRN: [id number]  Chief Complaint  Patient presents with  . Follow-up    HPI Rachel [name] is a 63 y.o. female.  H/O LGSIL pap.  Presents for F/U.  HPI  Past Medical History  Diagnosis Date  . Cancer   . Leukemia   . Lower back pain     Past Surgical History  Procedure Laterality Date  . Tonsillectomy    . Other surgical history      removal of uterine lining    Family History  Problem Relation Age of Onset  . Depression Son     Social History History  Substance Use Topics  . Smoking status: Never Smoker   . Smokeless tobacco: Never Used  . Alcohol Use: No    Allergies  Allergen Reactions  . Penicillins     "drops my heartbeat" "I just pass out"    Current Outpatient Prescriptions  Medication Sig Dispense Refill  . acetaminophen (TYLENOL) 500 MG tablet Take 500 mg by mouth every 6 (six) hours as needed for headache.    Marland Kitchen acyclovir (ZOVIRAX) 400 MG tablet Take 400 mg by mouth daily.    Marland Kitchen allopurinol (ZYLOPRIM) 300 MG tablet Take 300 mg by mouth daily.    Marland Kitchen aspirin 81 MG tablet Take 81 mg by mouth daily.    . Calcium Citrate (CITRACAL PO) Take 4 tablets by mouth daily.     . cholecalciferol (VITAMIN D) 1000 UNITS tablet Take 4,000 Units by mouth daily.    Marland Kitchen dexamethasone (DECADRON) 4 MG tablet Take 4 mg by mouth 2 (two) times daily. After chemo    . Estradiol (ELESTRIN) 0.52 MG/0.87 GM (0.06%) GEL Apply 2 application topically See admin instructions. 2 pumps thinly to upper arm daily at bedtime    . HYDROcodone-acetaminophen (NORCO/VICODIN) 5-325 MG per tablet Take 1 tablet by mouth every 6 (six) hours as needed for moderate pain. 60 tablet 0  . lidocaine-prilocaine (EMLA) cream Apply 1 application topically as needed (for port). Apply nickel size amount to skin on top of port. Cover with saran  Wrap at least one hour prior to access.    Marland Kitchen LORazepam (ATIVAN) 0.5 MG tablet Take 0.5 mg by  mouth at bedtime as needed for anxiety.    . ondansetron (ZOFRAN) 4 MG tablet Take 4 mg by mouth 2 (two) times daily as needed for nausea or vomiting.    . progesterone (PROMETRIUM) 200 MG capsule Take 200 mg by mouth See admin instructions. on days 1-12 every other month    . traZODone (DESYREL) 50 MG tablet Take 50 mg by mouth at bedtime.     No current facility-administered medications for this visit.    Review of Systems Review of Systems Constitutional: negative for fatigue and weight loss Respiratory: negative for cough and wheezing Cardiovascular: negative for chest pain, fatigue and palpitations Gastrointestinal: negative for abdominal pain and change in bowel habits Genitourinary:negative Integument/breast: negative for nipple discharge Musculoskeletal:negative for myalgias Neurological: negative for gait problems and tremors Behavioral/Psych: negative for abusive relationship, depression Endocrine: negative for temperature intolerance     Blood pressure 99/64, pulse 70, weight 116 lb (52.617 kg).  Physical Exam Physical Exam General:   alert  Skin:   no rash or abnormalities  Lungs:   clear to auscultation bilaterally  Heart:   regular rate and rhythm, S1, S2 normal, no murmur, click, rub or gallop  Breasts:  normal without suspicious masses, skin or nipple changes or axillary nodes  Abdomen:  normal findings: no organomegaly, soft, non-tender and no hernia  Pelvis:  External genitalia: normal general appearance Urinary system: urethral meatus normal and bladder without fullness, nontender Vaginal: normal without tenderness, induration or masses Cervix: normal appearance Adnexa: normal bimanual exam Uterus: anteverted and non-tender, normal size      Data Reviewed Pap  Assessment    LGSIL.  May need Cryo if persistent.     Plan    Repeat pap 6 months. Orders Placed This Encounter  Procedures  . SureSwab, Vaginosis/Vaginitis Plus   No orders of the  defined types were placed in this encounter.      [name],CHARLES A 02/01/2014, 4:43 AM

## 2014-02-02 ENCOUNTER — Ambulatory Visit: Payer: Medicare Other | Admitting: Oncology

## 2014-02-02 ENCOUNTER — Other Ambulatory Visit: Payer: Medicare Other

## 2014-02-02 ENCOUNTER — Ambulatory Visit: Payer: Medicare Other

## 2014-02-02 ENCOUNTER — Ambulatory Visit: Payer: Medicare Other | Admitting: Adult Health

## 2014-02-02 ENCOUNTER — Telehealth: Payer: Self-pay | Admitting: Nurse Practitioner

## 2014-02-02 ENCOUNTER — Telehealth: Payer: Self-pay | Admitting: *Deleted

## 2014-02-02 DIAGNOSIS — H2513 Age-related nuclear cataract, bilateral: Secondary | ICD-10-CM | POA: Diagnosis not present

## 2014-02-02 DIAGNOSIS — H43811 Vitreous degeneration, right eye: Secondary | ICD-10-CM | POA: Diagnosis not present

## 2014-02-02 DIAGNOSIS — H04123 Dry eye syndrome of bilateral lacrimal glands: Secondary | ICD-10-CM | POA: Diagnosis not present

## 2014-02-02 LAB — PAP IG AND HPV HIGH-RISK: HPV DNA High Risk: DETECTED — AB

## 2014-02-02 NOTE — Telephone Encounter (Signed)
per pof to sch pt appt-mailed copy of sch per pt req

## 2014-02-02 NOTE — Telephone Encounter (Signed)
Per staff message and POF I have scheduled appts. Advised scheduler of appts. JMW  

## 2014-02-03 ENCOUNTER — Ambulatory Visit: Payer: Medicare Other

## 2014-02-03 LAB — SURESWAB, VAGINOSIS/VAGINITIS PLUS
Atopobium vaginae: NOT DETECTED Log (cells/mL)
C. ALBICANS, DNA: NOT DETECTED
C. GLABRATA, DNA: NOT DETECTED
C. PARAPSILOSIS, DNA: NOT DETECTED
C. trachomatis RNA, TMA: NOT DETECTED
C. tropicalis, DNA: NOT DETECTED
Gardnerella vaginalis: NOT DETECTED Log (cells/mL)
LACTOBACILLUS SPECIES: 6.9 Log (cells/mL)
MEGASPHAERA SPECIES: NOT DETECTED Log (cells/mL)
N. GONORRHOEAE RNA, TMA: NOT DETECTED
T. vaginalis RNA, QL TMA: NOT DETECTED

## 2014-02-06 ENCOUNTER — Other Ambulatory Visit: Payer: Medicare Other

## 2014-02-09 ENCOUNTER — Other Ambulatory Visit (HOSPITAL_BASED_OUTPATIENT_CLINIC_OR_DEPARTMENT_OTHER): Payer: Medicare Other

## 2014-02-09 DIAGNOSIS — C9110 Chronic lymphocytic leukemia of B-cell type not having achieved remission: Secondary | ICD-10-CM

## 2014-02-09 LAB — COMPREHENSIVE METABOLIC PANEL (CC13)
ALK PHOS: 119 U/L (ref 40–150)
ALT: 20 U/L (ref 0–55)
AST: 20 U/L (ref 5–34)
Albumin: 3.4 g/dL — ABNORMAL LOW (ref 3.5–5.0)
Anion Gap: 8 mEq/L (ref 3–11)
BUN: 13.7 mg/dL (ref 7.0–26.0)
CO2: 27 mEq/L (ref 22–29)
CREATININE: 0.8 mg/dL (ref 0.6–1.1)
Calcium: 8.9 mg/dL (ref 8.4–10.4)
Chloride: 105 mEq/L (ref 98–109)
EGFR: 76 mL/min/{1.73_m2} — ABNORMAL LOW (ref >90)
Glucose: 110 mg/dl (ref 70–140)
Potassium: 3.9 mEq/L (ref 3.5–5.1)
SODIUM: 140 meq/L (ref 136–145)
Total Protein: 5.6 g/dL — ABNORMAL LOW (ref 6.4–8.3)

## 2014-02-09 LAB — CBC WITH DIFFERENTIAL/PLATELET
BASO%: 0.4 % (ref 0.0–2.0)
Basophils Absolute: 0.2 10*3/uL — ABNORMAL HIGH (ref 0.0–0.1)
EOS%: 0.2 % (ref 0.0–7.0)
Eosinophils Absolute: 0.1 10*3/uL (ref 0.0–0.5)
HEMATOCRIT: 34.3 % — AB (ref 34.8–46.6)
HGB: 10.8 g/dL — ABNORMAL LOW (ref 11.6–15.9)
LYMPH%: 95.1 % — AB (ref 14.0–49.7)
MCH: 30.1 pg (ref 25.1–34.0)
MCHC: 31.5 g/dL (ref 31.5–36.0)
MCV: 95.4 fL (ref 79.5–101.0)
MONO#: 0.2 10*3/uL (ref 0.1–0.9)
MONO%: 0.4 % (ref 0.0–14.0)
NEUT#: 2.3 10*3/uL (ref 1.5–6.5)
NEUT%: 3.9 % — AB (ref 38.4–76.8)
Platelets: 158 10*3/uL (ref 145–400)
RBC: 3.6 10*6/uL — AB (ref 3.70–5.45)
RDW: 15.3 % — ABNORMAL HIGH (ref 11.2–14.5)
WBC: 58.3 10*3/uL — AB (ref 3.9–10.3)
lymph#: 55.4 10*3/uL — ABNORMAL HIGH (ref 0.9–3.3)

## 2014-02-09 LAB — TECHNOLOGIST REVIEW

## 2014-02-20 ENCOUNTER — Encounter: Payer: Self-pay | Admitting: *Deleted

## 2014-02-20 ENCOUNTER — Other Ambulatory Visit: Payer: Medicare Other

## 2014-02-21 DIAGNOSIS — R109 Unspecified abdominal pain: Secondary | ICD-10-CM | POA: Diagnosis not present

## 2014-02-21 DIAGNOSIS — R05 Cough: Secondary | ICD-10-CM | POA: Diagnosis not present

## 2014-02-21 DIAGNOSIS — I950 Idiopathic hypotension: Secondary | ICD-10-CM | POA: Diagnosis not present

## 2014-02-21 DIAGNOSIS — R509 Fever, unspecified: Secondary | ICD-10-CM | POA: Diagnosis not present

## 2014-02-21 DIAGNOSIS — C9110 Chronic lymphocytic leukemia of B-cell type not having achieved remission: Secondary | ICD-10-CM | POA: Diagnosis not present

## 2014-02-23 ENCOUNTER — Other Ambulatory Visit (HOSPITAL_BASED_OUTPATIENT_CLINIC_OR_DEPARTMENT_OTHER): Payer: Medicare Other

## 2014-02-23 ENCOUNTER — Ambulatory Visit (HOSPITAL_BASED_OUTPATIENT_CLINIC_OR_DEPARTMENT_OTHER): Payer: Medicare Other | Admitting: Oncology

## 2014-02-23 VITALS — BP 92/48 | HR 78 | Temp 98.2°F | Resp 18 | Ht 59.0 in | Wt 117.5 lb

## 2014-02-23 DIAGNOSIS — D649 Anemia, unspecified: Secondary | ICD-10-CM

## 2014-02-23 DIAGNOSIS — D630 Anemia in neoplastic disease: Secondary | ICD-10-CM

## 2014-02-23 DIAGNOSIS — C9110 Chronic lymphocytic leukemia of B-cell type not having achieved remission: Secondary | ICD-10-CM

## 2014-02-23 LAB — COMPREHENSIVE METABOLIC PANEL (CC13)
ALBUMIN: 3.4 g/dL — AB (ref 3.5–5.0)
ALT: 46 U/L (ref 0–55)
AST: 44 U/L — ABNORMAL HIGH (ref 5–34)
Alkaline Phosphatase: 106 U/L (ref 40–150)
Anion Gap: 8 mEq/L (ref 3–11)
BUN: 10.5 mg/dL (ref 7.0–26.0)
CALCIUM: 9.1 mg/dL (ref 8.4–10.4)
CHLORIDE: 104 meq/L (ref 98–109)
CO2: 30 mEq/L — ABNORMAL HIGH (ref 22–29)
CREATININE: 1 mg/dL (ref 0.6–1.1)
EGFR: 56 mL/min/{1.73_m2} — ABNORMAL LOW (ref >90)
Glucose: 108 mg/dl (ref 70–140)
POTASSIUM: 4.2 meq/L (ref 3.5–5.1)
Sodium: 141 mEq/L (ref 136–145)
Total Bilirubin: 0.32 mg/dL (ref 0.20–1.20)
Total Protein: 5.8 g/dL — ABNORMAL LOW (ref 6.4–8.3)

## 2014-02-23 LAB — CBC WITH DIFFERENTIAL/PLATELET
BASO%: 0.2 % (ref 0.0–2.0)
Basophils Absolute: 0.1 10*3/uL (ref 0.0–0.1)
EOS%: 2.1 % (ref 0.0–7.0)
Eosinophils Absolute: 1.1 10*3/uL — ABNORMAL HIGH (ref 0.0–0.5)
HEMATOCRIT: 34.8 % (ref 34.8–46.6)
HGB: 10.7 g/dL — ABNORMAL LOW (ref 11.6–15.9)
LYMPH%: 92.7 % — ABNORMAL HIGH (ref 14.0–49.7)
MCH: 29.7 pg (ref 25.1–34.0)
MCHC: 30.8 g/dL — ABNORMAL LOW (ref 31.5–36.0)
MCV: 96.3 fL (ref 79.5–101.0)
MONO#: 0.2 10*3/uL (ref 0.1–0.9)
MONO%: 0.4 % (ref 0.0–14.0)
NEUT#: 2.5 10*3/uL (ref 1.5–6.5)
NEUT%: 4.6 % — ABNORMAL LOW (ref 38.4–76.8)
Platelets: 126 10*3/uL — ABNORMAL LOW (ref 145–400)
RBC: 3.61 10*6/uL — ABNORMAL LOW (ref 3.70–5.45)
RDW: 15.1 % — AB (ref 11.2–14.5)
WBC: 54.5 10*3/uL — AB (ref 3.9–10.3)
lymph#: 50.6 10*3/uL — ABNORMAL HIGH (ref 0.9–3.3)

## 2014-02-23 LAB — TECHNOLOGIST REVIEW

## 2014-02-23 NOTE — Progress Notes (Signed)
ID: Rachel [name]   DOB: 07-14-1950  MR#: [id number]  GQQ#:[id number]  PCP: Rachel Bond Md GYN: SU:  OTHER MD: Rachel [name] M.D.  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 She has been treated with Rituxan, ofatumumab, and cladribine, with details summarized below.  INTERVAL HISTORY: Rachel [name] returns today for follow up of her chronic lymphoid leukemia accompanied by her husband Rachel [name].. Today is day 29, cycle 2 of 4-6 planned cycles of bendamustine/ rituximab. We are holding treatment 1 week at her request because of the holidays  REVIEW OF SYSTEMS: Rachel [name] is tolerating treatment well. She had a urinary tract infection treated by her primary care physician with antibiotics which gave her a little bit of diarrhea. All that has resolved. She has a little bit of reflux, which worsens with chemotherapy, likely because of the steroids she receives. Sometimes she has a little bit of blurred vision. She has a dry cough, and mild sinus symptoms. She has rare headaches, which are not a new problem for her. She feels forgetful, anxious, and depressed. She has moderate hot flashes. A detailed review of systems today was otherwise stable and in particular there has been no significant weight loss, hair loss, nausea, vomiting, drenching sweats, fever, rash, or bleeding.   PAST MEDICAL HISTORY: Past Medical History  Diagnosis Date  . Cancer   . Leukemia   . Lower back pain     PAST SURGICAL HISTORY: Past Surgical History  Procedure Laterality Date  . Tonsillectomy    . Other surgical history      removal of uterine lining    FAMILY HISTORY Family History  Problem Relation Age of Onset  . Depression Son     GYNECOLOGIC HISTORY:  SOCIAL HISTORY: Her husband Rachel [name] is retired from [employer]. Her son Rachel [name] lives in [place] and has 3 sons; he works for a Pharmacist, community. A second son lives in [place] and has two children. Son Rachel [name] lives in [place] and has a daughter, as well as 2 sons by marriage.   ADVANCED DIRECTIVES:  HEALTH MAINTENANCE: History  Substance Use Topics  . Smoking status: Never Smoker   . Smokeless tobacco: Never Used  . Alcohol Use: No     Colonoscopy: due  PAP: December 2012/ [name]  Bone density: Due  Lipid panel: per Dr Rachel [name]  Allergies  Allergen Reactions  . Penicillins     "drops my heartbeat" "I just pass out"    Current Outpatient Prescriptions  Medication Sig Dispense Refill  . acetaminophen (TYLENOL) 500 MG tablet Take 500 mg by mouth every 6 (six) hours as needed for headache.    Marland Kitchen acyclovir (ZOVIRAX) 400 MG tablet Take 400 mg by mouth daily.    Marland Kitchen allopurinol (ZYLOPRIM) 300 MG tablet Take 300 mg by mouth daily.    Marland Kitchen aspirin 81 MG tablet Take 81 mg by mouth daily.    . Calcium Citrate (CITRACAL PO) Take 4 tablets by mouth daily.     . cholecalciferol (VITAMIN D) 1000 UNITS tablet Take 4,000 Units by mouth daily.    Marland Kitchen dexamethasone (DECADRON) 4 MG tablet Take 4 mg by mouth 2 (two) times daily. After chemo    . Estradiol (ELESTRIN) 0.52 MG/0.87 GM (0.06%) GEL Apply 2 application topically See admin instructions. 2 pumps thinly to upper arm daily at bedtime    .  HYDROcodone-acetaminophen (NORCO/VICODIN) 5-325 MG per tablet Take 1 tablet by mouth every 6 (six) hours as needed for moderate pain. (Patient not taking: Reported on 02/01/2014) 60 tablet 0  . lidocaine-prilocaine (EMLA) cream Apply 1 application topically as needed (for port). Apply nickel size amount to skin on top of port. Cover with saran  Wrap at least one hour prior to access.    Marland Kitchen LORazepam (ATIVAN) 0.5 MG tablet Take 0.5 mg by mouth at bedtime as needed for anxiety.    Marland Kitchen omeprazole (PRILOSEC) 20 MG capsule Take 20 mg by mouth daily.    . ondansetron (ZOFRAN)  4 MG tablet Take 4 mg by mouth 2 (two) times daily as needed for nausea or vomiting.    . progesterone (PROMETRIUM) 200 MG capsule Take 200 mg by mouth See admin instructions. on days 1-12 every other month    . traZODone (DESYREL) 50 MG tablet Take 50 mg by mouth at bedtime.     No current facility-administered medications for this visit.    OBJECTIVE: Middle-aged white woman in no acute distress Filed Vitals:   02/23/14 1357  BP: 92/48  Pulse: 78  Temp: 98.2 F (36.8 C)  Resp: 18   Filed Weights   02/23/14 1357  Weight: 117 lb 8 oz (53.298 kg)   Body mass index is 23.72 kg/(m^2).    ECOG FS: 1  Sclerae unicteric, pupils equal and reactive Oropharynx clear and moist-- no thrush No cervical or supraclavicular adenopathy, no axillary or inguinal adenopathy Lungs no rales or rhonchi Heart regular rate and rhythm Abd soft, nontender, positive bowel sounds, no splenomegaly MSK no focal spinal tenderness, no joint edema Neuro: nonfocal, well oriented, appropriate affect Breasts: Deferred    LAB RESULTS:  Lab Results  Component Value Date   WBC 54.5* 02/23/2014   NEUTROABS 2.5 02/23/2014   HGB 10.7* 02/23/2014   HCT 34.8 02/23/2014   MCV 96.3 02/23/2014   PLT 126* 02/23/2014      Chemistry      Component Value Date/Time   NA 141 02/23/2014 1347   NA 135* 06/04/2013 1130   K 4.2 02/23/2014 1347   K 4.1 06/04/2013 1130   CL 98 06/04/2013 1130   CL 101 12/15/2011 1322   CO2 30* 02/23/2014 1347   CO2 24 06/04/2013 1130   BUN 10.5 02/23/2014 1347   BUN 9 06/04/2013 1130   CREATININE 1.0 02/23/2014 1347   CREATININE 0.61 06/04/2013 1130      Component Value Date/Time   CALCIUM 9.1 02/23/2014 1347   CALCIUM 9.0 06/04/2013 1130   ALKPHOS 106 02/23/2014 1347   ALKPHOS 112 06/04/2013 1130   AST 44* 02/23/2014 1347   AST 19 06/04/2013 1130   ALT 46 02/23/2014 1347   ALT 22 06/04/2013 1130   BILITOT 0.32 02/23/2014 1347   BILITOT 0.5 06/04/2013 1130     Results  for Rachel [name] (MRN [id number]) as of 02/25/2014 10:03  Ref. Range 07/19/2013 13:55 08/30/2013 13:56 12/05/2013 11:24 12/26/2013 13:13 12/30/2013 13:25 01/09/2014 08:17 01/16/2014 09:06 01/24/2014 09:05 02/01/2014 13:50 02/09/2014 14:19 02/23/2014 13:44  lymph# Latest Range: 0.9-3.3 10e3/uL 116.4 (H) 145.1 (H) 243.8 (H) 145.6 (H) 58.2 (H) 46.7 (H) 61.2 (H) 67.0 (H) 56.3 (H) 55.4 (H) 50.6 (H)    STUDIES: No results found.   ASSESSMENT: 63 y.o.  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 dating back to January of 2002 treated with Rituxan in 2004 and 2008 and January/February 2012  (1)  ofatumumab started 12/10/2011, with a complete remission not obtained after the first 8 weekly treatments; an additional 4 monthly treatments completed 06/08/2012  (2) cladribine x6 completed 04/06/2012  (3) started bendamustine/rituximab 12/12/2013, to be repeated every 28 days for 4-6 cycles  (4) anemia: This was present at the start of chemotherapy and was an indication for treatment; at this point no intervention is required   PLAN: Rachel [name] had a very strong initial response to the bendamustine but then less of a response with cycle 2. We're going to go with 2 additional cycles and then decide whether to change treatment, started observation, or go for yet another 2 cycles. She is tolerating therapy well, and I am not changing her antinausea or other medications. We are following her liver function tests closely. If this point there is no need to further postpone therapy and she is scheduled to begin cycle 3 on 02/27/2013.  She knows to call for any problems that may develop before her next visit here.  Chauncey Cruel, [place] [phone]  02/25/2014

## 2014-02-25 DIAGNOSIS — D630 Anemia in neoplastic disease: Secondary | ICD-10-CM | POA: Insufficient documentation

## 2014-02-27 ENCOUNTER — Telehealth: Payer: Self-pay | Admitting: Medical Oncology

## 2014-02-27 ENCOUNTER — Ambulatory Visit (HOSPITAL_BASED_OUTPATIENT_CLINIC_OR_DEPARTMENT_OTHER): Payer: Medicare Other | Admitting: Nutrition

## 2014-02-27 ENCOUNTER — Other Ambulatory Visit: Payer: Self-pay | Admitting: Oncology

## 2014-02-27 ENCOUNTER — Ambulatory Visit (HOSPITAL_BASED_OUTPATIENT_CLINIC_OR_DEPARTMENT_OTHER): Payer: Medicare Other

## 2014-02-27 ENCOUNTER — Encounter: Payer: Self-pay | Admitting: Nurse Practitioner

## 2014-02-27 ENCOUNTER — Other Ambulatory Visit: Payer: Self-pay | Admitting: Lab

## 2014-02-27 ENCOUNTER — Ambulatory Visit (HOSPITAL_BASED_OUTPATIENT_CLINIC_OR_DEPARTMENT_OTHER): Payer: Medicare Other | Admitting: Nurse Practitioner

## 2014-02-27 ENCOUNTER — Ambulatory Visit: Payer: Medicare Other

## 2014-02-27 ENCOUNTER — Other Ambulatory Visit: Payer: Self-pay | Admitting: *Deleted

## 2014-02-27 ENCOUNTER — Encounter (HOSPITAL_COMMUNITY): Payer: Self-pay | Admitting: *Deleted

## 2014-02-27 ENCOUNTER — Inpatient Hospital Stay (HOSPITAL_COMMUNITY)
Admission: AD | Admit: 2014-02-27 | Discharge: 2014-02-28 | DRG: 872 | Disposition: A | Discharge status: Home / Self Care | Payer: Medicare Other | Source: Ambulatory Visit | Attending: Internal Medicine | Admitting: Internal Medicine

## 2014-02-27 ENCOUNTER — Inpatient Hospital Stay (HOSPITAL_COMMUNITY): Payer: Medicare Other

## 2014-02-27 VITALS — BP 91/50 | HR 80 | Temp 98.4°F | Resp 18

## 2014-02-27 DIAGNOSIS — C9110 Chronic lymphocytic leukemia of B-cell type not having achieved remission: Secondary | ICD-10-CM

## 2014-02-27 DIAGNOSIS — A419 Sepsis, unspecified organism: Secondary | ICD-10-CM | POA: Diagnosis present

## 2014-02-27 DIAGNOSIS — I959 Hypotension, unspecified: Secondary | ICD-10-CM | POA: Diagnosis present

## 2014-02-27 DIAGNOSIS — R509 Fever, unspecified: Secondary | ICD-10-CM

## 2014-02-27 DIAGNOSIS — R87612 Low grade squamous intraepithelial lesion on cytologic smear of cervix (LGSIL): Secondary | ICD-10-CM | POA: Diagnosis present

## 2014-02-27 DIAGNOSIS — T451X5A Adverse effect of antineoplastic and immunosuppressive drugs, initial encounter: Secondary | ICD-10-CM | POA: Diagnosis present

## 2014-02-27 DIAGNOSIS — T7840XA Allergy, unspecified, initial encounter: Secondary | ICD-10-CM

## 2014-02-27 DIAGNOSIS — Z7982 Long term (current) use of aspirin: Secondary | ICD-10-CM | POA: Diagnosis not present

## 2014-02-27 DIAGNOSIS — Z880 Allergy status to penicillin: Secondary | ICD-10-CM | POA: Diagnosis not present

## 2014-02-27 DIAGNOSIS — Y9289 Other specified places as the place of occurrence of the external cause: Secondary | ICD-10-CM | POA: Diagnosis not present

## 2014-02-27 DIAGNOSIS — R5081 Fever presenting with conditions classified elsewhere: Secondary | ICD-10-CM | POA: Diagnosis not present

## 2014-02-27 DIAGNOSIS — Z5112 Encounter for antineoplastic immunotherapy: Secondary | ICD-10-CM

## 2014-02-27 DIAGNOSIS — T8090XA Unspecified complication following infusion and therapeutic injection, initial encounter: Secondary | ICD-10-CM | POA: Diagnosis present

## 2014-02-27 DIAGNOSIS — R309 Painful micturition, unspecified: Secondary | ICD-10-CM

## 2014-02-27 DIAGNOSIS — D630 Anemia in neoplastic disease: Secondary | ICD-10-CM | POA: Diagnosis not present

## 2014-02-27 DIAGNOSIS — R6889 Other general symptoms and signs: Secondary | ICD-10-CM

## 2014-02-27 DIAGNOSIS — R3911 Hesitancy of micturition: Secondary | ICD-10-CM | POA: Diagnosis present

## 2014-02-27 DIAGNOSIS — M545 Low back pain: Secondary | ICD-10-CM | POA: Diagnosis present

## 2014-02-27 DIAGNOSIS — T8090XD Unspecified complication following infusion and therapeutic injection, subsequent encounter: Secondary | ICD-10-CM | POA: Diagnosis not present

## 2014-02-27 DIAGNOSIS — R531 Weakness: Secondary | ICD-10-CM | POA: Diagnosis not present

## 2014-02-27 LAB — PHOSPHORUS: Phosphorus: 2.3 mg/dL (ref 2.3–4.6)

## 2014-02-27 LAB — CBC WITH DIFFERENTIAL/PLATELET
Basophils Absolute: 0.3 10*3/uL — ABNORMAL HIGH (ref 0.0–0.1)
Basophils Relative: 1 % (ref 0–1)
EOS PCT: 1 % (ref 0–5)
Eosinophils Absolute: 0.3 10*3/uL (ref 0.0–0.7)
HCT: 32.1 % — ABNORMAL LOW (ref 36.0–46.0)
HEMOGLOBIN: 10.2 g/dL — AB (ref 12.0–15.0)
Lymphocytes Relative: 85 % — ABNORMAL HIGH (ref 12–46)
Lymphs Abs: 26.7 10*3/uL — ABNORMAL HIGH (ref 0.7–4.0)
MCH: 31 pg (ref 26.0–34.0)
MCHC: 31.8 g/dL (ref 30.0–36.0)
MCV: 97.6 fL (ref 78.0–100.0)
MONO ABS: 0.6 10*3/uL (ref 0.1–1.0)
Monocytes Relative: 2 % — ABNORMAL LOW (ref 3–12)
Neutro Abs: 3.4 10*3/uL (ref 1.7–7.7)
Neutrophils Relative %: 11 % — ABNORMAL LOW (ref 43–77)
PLATELETS: 90 10*3/uL — AB (ref 150–400)
RBC: 3.29 MIL/uL — AB (ref 3.87–5.11)
RDW: 15 % (ref 11.5–15.5)
WBC: 31.3 10*3/uL — ABNORMAL HIGH (ref 4.0–10.5)

## 2014-02-27 LAB — COMPREHENSIVE METABOLIC PANEL
ALT: 71 U/L — ABNORMAL HIGH (ref 0–35)
ANION GAP: 9 (ref 5–15)
AST: 142 U/L — ABNORMAL HIGH (ref 0–37)
Albumin: 3.2 g/dL — ABNORMAL LOW (ref 3.5–5.2)
Alkaline Phosphatase: 121 U/L — ABNORMAL HIGH (ref 39–117)
BILIRUBIN TOTAL: 0.2 mg/dL — AB (ref 0.3–1.2)
BUN: 10 mg/dL (ref 6–23)
CO2: 24 mmol/L (ref 19–32)
CREATININE: 0.82 mg/dL (ref 0.50–1.10)
Calcium: 8.2 mg/dL — ABNORMAL LOW (ref 8.4–10.5)
Chloride: 107 mEq/L (ref 96–112)
GFR calc Af Amer: 85 mL/min — ABNORMAL LOW (ref >90)
GFR, EST NON AFRICAN AMERICAN: 73 mL/min — AB (ref >90)
GLUCOSE: 219 mg/dL — AB (ref 70–99)
POTASSIUM: 4.2 mmol/L (ref 3.5–5.1)
SODIUM: 140 mmol/L (ref 135–145)
Total Protein: 5.5 g/dL — ABNORMAL LOW (ref 6.0–8.3)

## 2014-02-27 LAB — URINALYSIS, MICROSCOPIC - CHCC
BILIRUBIN (URINE): NEGATIVE
BLOOD: NEGATIVE
Glucose: NEGATIVE mg/dL
KETONES: NEGATIVE mg/dL
LEUKOCYTE ESTERASE: NEGATIVE
Nitrite: NEGATIVE
PH: 7.5 (ref 4.6–8.0)
Protein: 30 mg/dL
SPECIFIC GRAVITY, URINE: 1.01 (ref 1.003–1.035)
Urobilinogen, UR: 0.2 mg/dL (ref 0.2–1)

## 2014-02-27 LAB — PROTIME-INR
INR: 1.1 (ref 0.00–1.49)
PROTHROMBIN TIME: 14.3 s (ref 11.6–15.2)

## 2014-02-27 LAB — URINALYSIS, ROUTINE W REFLEX MICROSCOPIC
Bilirubin Urine: NEGATIVE
Glucose, UA: 100 mg/dL — AB
Ketones, ur: NEGATIVE mg/dL
NITRITE: NEGATIVE
PROTEIN: NEGATIVE mg/dL
Specific Gravity, Urine: 1.004 — ABNORMAL LOW (ref 1.005–1.030)
Urobilinogen, UA: 0.2 mg/dL (ref 0.0–1.0)
pH: 6.5 (ref 5.0–8.0)

## 2014-02-27 LAB — LACTIC ACID, PLASMA: Lactic Acid, Venous: 2.4 mmol/L — ABNORMAL HIGH (ref 0.5–2.2)

## 2014-02-27 LAB — URINE MICROSCOPIC-ADD ON

## 2014-02-27 LAB — APTT: APTT: 25 s (ref 24–37)

## 2014-02-27 LAB — PROCALCITONIN: Procalcitonin: 3.87 ng/mL

## 2014-02-27 LAB — MAGNESIUM: MAGNESIUM: 2 mg/dL (ref 1.5–2.5)

## 2014-02-27 MED ORDER — POLYETHYLENE GLYCOL 3350 17 G PO PACK
17.0000 g | PACK | Freq: Every day | ORAL | Status: DC | PRN
  Filled 2014-02-27: qty 1

## 2014-02-27 MED ORDER — SORBITOL 70 % SOLN
30.0000 mL | Freq: Every day | Status: DC | PRN
  Filled 2014-02-27: qty 30

## 2014-02-27 MED ORDER — SODIUM CHLORIDE 0.9 % IV SOLN: Freq: Once | INTRAVENOUS | Status: DC | PRN

## 2014-02-27 MED ORDER — MEPERIDINE HCL 25 MG/ML IJ SOLN
INTRAMUSCULAR | Status: AC
Start: 2014-02-27 — End: 2014-02-27
  Filled 2014-02-27: qty 1

## 2014-02-27 MED ORDER — ACETAMINOPHEN 650 MG RE SUPP: 650.0000 mg | Freq: Four times a day (QID) | RECTAL | Status: DC | PRN

## 2014-02-27 MED ORDER — DEXAMETHASONE SODIUM PHOSPHATE 10 MG/ML IJ SOLN: 10.0000 mg | Freq: Once | INTRAMUSCULAR | Status: DC

## 2014-02-27 MED ORDER — LIDOCAINE-PRILOCAINE 2.5-2.5 % EX CREA: 1.0000 "application " | TOPICAL_CREAM | CUTANEOUS | Status: DC | PRN

## 2014-02-27 MED ORDER — VITAMIN D3 25 MCG (1000 UNIT) PO TABS
4000.0000 [IU] | ORAL_TABLET | Freq: Every day | ORAL | Status: DC
  Administered 2014-02-27 – 2014-02-28 (×2): 4000 [IU] via ORAL
  Filled 2014-02-27 (×2): qty 4

## 2014-02-27 MED ORDER — DIPHENHYDRAMINE HCL 25 MG PO CAPS
25.0000 mg | ORAL_CAPSULE | Freq: Once | ORAL | Status: AC
  Administered 2014-02-27: 25 mg via ORAL

## 2014-02-27 MED ORDER — SODIUM CHLORIDE 0.9 % IV BOLUS (SEPSIS)
1000.0000 mL | Freq: Once | INTRAVENOUS | Status: AC
  Administered 2014-02-27: 1000 mL via INTRAVENOUS

## 2014-02-27 MED ORDER — ACYCLOVIR 400 MG PO TABS
400.0000 mg | ORAL_TABLET | Freq: Every day | ORAL | Status: DC
  Administered 2014-02-27 – 2014-02-28 (×2): 400 mg via ORAL
  Filled 2014-02-27 (×2): qty 1

## 2014-02-27 MED ORDER — FAMOTIDINE IN NACL 20-0.9 MG/50ML-% IV SOLN
20.0000 mg | Freq: Two times a day (BID) | INTRAVENOUS | Status: DC
  Administered 2014-02-27 – 2014-02-28 (×2): 20 mg via INTRAVENOUS
  Filled 2014-02-27 (×3): qty 50

## 2014-02-27 MED ORDER — ENOXAPARIN SODIUM 40 MG/0.4ML ~~LOC~~ SOLN
40.0000 mg | SUBCUTANEOUS | Status: DC
  Filled 2014-02-27 (×2): qty 0.4

## 2014-02-27 MED ORDER — SODIUM CHLORIDE 0.9 % IJ SOLN
10.0000 mL | INTRAMUSCULAR | Status: DC | PRN
  Filled 2014-02-27: qty 10

## 2014-02-27 MED ORDER — HYDROCODONE-ACETAMINOPHEN 5-325 MG PO TABS: 1.0000 | ORAL_TABLET | Freq: Four times a day (QID) | ORAL | Status: DC | PRN

## 2014-02-27 MED ORDER — METHYLPREDNISOLONE SODIUM SUCC 125 MG IJ SOLR
125.0000 mg | Freq: Once | INTRAMUSCULAR | Status: AC | PRN
  Administered 2014-02-27: 125 mg via INTRAVENOUS

## 2014-02-27 MED ORDER — ACETAMINOPHEN 325 MG PO TABS: 650.0000 mg | ORAL_TABLET | Freq: Four times a day (QID) | ORAL | Status: DC | PRN

## 2014-02-27 MED ORDER — ESTRADIOL 0.52 MG/0.87 GM (0.06%) TD GEL: 2.0000 "application " | Freq: Every day | TRANSDERMAL | Status: DC

## 2014-02-27 MED ORDER — HEPARIN SOD (PORK) LOCK FLUSH 100 UNIT/ML IV SOLN
500.0000 [IU] | Freq: Once | INTRAVENOUS | Status: DC | PRN
  Filled 2014-02-27: qty 5

## 2014-02-27 MED ORDER — ASPIRIN 81 MG PO TABS: 81.0000 mg | ORAL_TABLET | Freq: Every day | ORAL | Status: DC

## 2014-02-27 MED ORDER — SODIUM CHLORIDE 0.9 % IV SOLN: INTRAVENOUS | Status: DC

## 2014-02-27 MED ORDER — ONDANSETRON 8 MG/50ML IVPB (CHCC): 8.0000 mg | Freq: Once | INTRAVENOUS | Status: DC

## 2014-02-27 MED ORDER — FAMOTIDINE IN NACL 20-0.9 MG/50ML-% IV SOLN
20.0000 mg | Freq: Once | INTRAVENOUS | Status: AC | PRN
  Administered 2014-02-27: 20 mg via INTRAVENOUS

## 2014-02-27 MED ORDER — GI COCKTAIL ~~LOC~~
30.0000 mL | Freq: Three times a day (TID) | ORAL | Status: DC | PRN
  Filled 2014-02-27: qty 30

## 2014-02-27 MED ORDER — DIPHENHYDRAMINE HCL 50 MG/ML IJ SOLN
50.0000 mg | Freq: Every day | INTRAMUSCULAR | Status: DC
  Administered 2014-02-27 – 2014-02-28 (×2): 50 mg via INTRAVENOUS
  Filled 2014-02-27 (×3): qty 1

## 2014-02-27 MED ORDER — ACETAMINOPHEN 325 MG PO TABS
ORAL_TABLET | ORAL | Status: AC
  Filled 2014-02-27: qty 2

## 2014-02-27 MED ORDER — DIPHENHYDRAMINE HCL 25 MG PO CAPS
ORAL_CAPSULE | ORAL | Status: AC
  Filled 2014-02-27: qty 1

## 2014-02-27 MED ORDER — MEPERIDINE HCL 25 MG/ML IJ SOLN
12.5000 mg | Freq: Once | INTRAMUSCULAR | Status: AC
  Administered 2014-02-27: 11:00:00 via INTRAVENOUS

## 2014-02-27 MED ORDER — SODIUM CHLORIDE 0.9 % IV SOLN
INTRAVENOUS | Status: DC
  Administered 2014-02-27 – 2014-02-28 (×2): via INTRAVENOUS

## 2014-02-27 MED ORDER — DOCUSATE SODIUM 100 MG PO CAPS
100.0000 mg | ORAL_CAPSULE | Freq: Two times a day (BID) | ORAL | Status: DC
  Administered 2014-02-27: 100 mg via ORAL
  Filled 2014-02-27 (×3): qty 1

## 2014-02-27 MED ORDER — LORAZEPAM 0.5 MG PO TABS
0.5000 mg | ORAL_TABLET | Freq: Every evening | ORAL | Status: DC | PRN
Start: 2014-02-27 — End: 2014-02-28

## 2014-02-27 MED ORDER — METHYLPREDNISOLONE SODIUM SUCC 125 MG IJ SOLR
125.0000 mg | Freq: Two times a day (BID) | INTRAMUSCULAR | Status: DC
  Administered 2014-02-27 – 2014-02-28 (×2): 125 mg via INTRAVENOUS
  Filled 2014-02-27 (×3): qty 2

## 2014-02-27 MED ORDER — LEVOFLOXACIN IN D5W 750 MG/150ML IV SOLN: 750.0000 mg | INTRAVENOUS | Status: DC

## 2014-02-27 MED ORDER — ONDANSETRON 8 MG/NS 50 ML IVPB
INTRAVENOUS | Status: AC
  Filled 2014-02-27: qty 8

## 2014-02-27 MED ORDER — TRAZODONE HCL 50 MG PO TABS
50.0000 mg | ORAL_TABLET | Freq: Every day | ORAL | Status: DC
  Administered 2014-02-27: 50 mg via ORAL
  Filled 2014-02-27 (×2): qty 1

## 2014-02-27 MED ORDER — MAGNESIUM CITRATE PO SOLN: 1.0000 | Freq: Once | ORAL | Status: AC | PRN

## 2014-02-27 MED ORDER — DIPHENHYDRAMINE HCL 50 MG/ML IJ SOLN
25.0000 mg | Freq: Once | INTRAMUSCULAR | Status: AC | PRN
  Administered 2014-02-27: 25 mg via INTRAVENOUS

## 2014-02-27 MED ORDER — ACETAMINOPHEN 500 MG PO TABS
500.0000 mg | ORAL_TABLET | Freq: Three times a day (TID) | ORAL | Status: DC
  Administered 2014-02-27: 500 mg via ORAL
  Filled 2014-02-27 (×5): qty 1

## 2014-02-27 MED ORDER — ALLOPURINOL 300 MG PO TABS
300.0000 mg | ORAL_TABLET | Freq: Every day | ORAL | Status: DC
  Administered 2014-02-27 – 2014-02-28 (×2): 300 mg via ORAL
  Filled 2014-02-27 (×2): qty 1

## 2014-02-27 MED ORDER — BENDAMUSTINE HCL CHEMO INJECTION 180 MG/2ML
90.0000 mg/m2 | Freq: Once | INTRAVENOUS | Status: DC
  Filled 2014-02-27: qty 1.5

## 2014-02-27 MED ORDER — VANCOMYCIN HCL IN DEXTROSE 1-5 GM/200ML-% IV SOLN
1000.0000 mg | Freq: Once | INTRAVENOUS | Status: AC
  Administered 2014-02-27: 1000 mg via INTRAVENOUS
  Filled 2014-02-27: qty 200

## 2014-02-27 MED ORDER — SODIUM CHLORIDE 0.9 % IJ SOLN: 3.0000 mL | Freq: Two times a day (BID) | INTRAMUSCULAR | Status: DC

## 2014-02-27 MED ORDER — DEXAMETHASONE SODIUM PHOSPHATE 10 MG/ML IJ SOLN
INTRAMUSCULAR | Status: AC
  Filled 2014-02-27: qty 1

## 2014-02-27 MED ORDER — ACETAMINOPHEN 325 MG PO TABS
650.0000 mg | ORAL_TABLET | Freq: Once | ORAL | Status: AC
  Administered 2014-02-27: 650 mg via ORAL

## 2014-02-27 MED ORDER — ONDANSETRON HCL 4 MG PO TABS: 4.0000 mg | ORAL_TABLET | Freq: Four times a day (QID) | ORAL | Status: DC | PRN

## 2014-02-27 MED ORDER — ONDANSETRON HCL 4 MG/2ML IJ SOLN: 4.0000 mg | Freq: Four times a day (QID) | INTRAMUSCULAR | Status: DC | PRN

## 2014-02-27 MED ORDER — LEVOFLOXACIN IN D5W 750 MG/150ML IV SOLN
750.0000 mg | Freq: Once | INTRAVENOUS | Status: AC
  Administered 2014-02-27: 750 mg via INTRAVENOUS
  Filled 2014-02-27: qty 150

## 2014-02-27 MED ORDER — VANCOMYCIN HCL 500 MG IV SOLR
500.0000 mg | Freq: Two times a day (BID) | INTRAVENOUS | Status: DC
  Administered 2014-02-28 (×2): 500 mg via INTRAVENOUS
  Filled 2014-02-27 (×3): qty 500

## 2014-02-27 MED ORDER — SODIUM CHLORIDE 0.9 % IV SOLN
Freq: Once | INTRAVENOUS | Status: AC
  Administered 2014-02-27: 09:00:00 via INTRAVENOUS

## 2014-02-27 MED ORDER — ALBUTEROL SULFATE (2.5 MG/3ML) 0.083% IN NEBU: 2.5000 mg | INHALATION_SOLUTION | RESPIRATORY_TRACT | Status: DC | PRN

## 2014-02-27 MED ORDER — SODIUM CHLORIDE 0.9 % IV BOLUS (SEPSIS)
500.0000 mL | Freq: Once | INTRAVENOUS | Status: AC
  Administered 2014-02-27: 500 mL via INTRAVENOUS

## 2014-02-27 MED ORDER — SODIUM CHLORIDE 0.9 % IV SOLN
375.0000 mg/m2 | Freq: Once | INTRAVENOUS | Status: AC
  Administered 2014-02-27: 600 mg via INTRAVENOUS
  Filled 2014-02-27: qty 60

## 2014-02-27 MED ORDER — ASPIRIN 81 MG PO CHEW
81.0000 mg | CHEWABLE_TABLET | Freq: Every day | ORAL | Status: DC
  Administered 2014-02-27 – 2014-02-28 (×2): 81 mg via ORAL
  Filled 2014-02-27 (×2): qty 1

## 2014-02-27 NOTE — Progress Notes (Signed)
At 1500, pt transferred via  Wheelchair  To Room 1425. Pt escorted  With RN. Report given to floor nurse Gregary Signs.

## 2014-02-27 NOTE — Progress Notes (Signed)
64 year old female diagnosed with CLL. She is a patient of Dr. Jana Hakim.  Past medical history includes lower back pain.  Medications include vitamin D, Decadron, Ativan, Prilosec, Zofran, and progesterone.  Labs include albumin 3.4 on December 17.  Height: 4 feet 11 inches. Weight: 117.5 pounds. Usual body weight: 120 pounds November 2015. BMI: 23.72.  Patient concerned she will have increased nausea and vomiting after treatment today.   She states her blood pressure is low.   Dietary recall reveals patient is consuming protein throughout the day.   Patient enjoys smoothies.   Weight within usual body weight range.  Nutrition diagnosis: Food and nutrition related knowledge deficit related to treatment for CLL as evidenced by no prior need for nutrition related information.  Intervention:  Patient educated on strategies for oral intake if she develops nausea and vomiting. Encouraged patient to take nausea medication as prescribed. Educated patient to consume smaller more frequent meals with bland, cold food choices. Questions were answered.  Teach back method used.  Monitoring, evaluation, goals: Patient will tolerate adequate calories and protein to minimize weight loss throughout treatment.  Next visit: To be scheduled as needed.

## 2014-02-27 NOTE — Progress Notes (Signed)
will   SYMPTOM MANAGEMENT CLINIC   HPI: Rachel [name] 64 y.o. female diagnosed with chronic lymphocytic leukemia.  Currently undergoing Rituxan/bendamustine chemotherapy regimen.  Patient presented to the [facility] today to receive cycle 3 of her Rituxan/bendamustine chemotherapy regimen.  She received approximately 39 ML's of the Rituxan portion of her chemotherapy; and developed tachycardia, hypertension, and significant rigors.  Patient was afebrile on initial check 1 arrived at the cancer center.  Rituxan infusion was held; and reaction symptoms managed per hypersensitivity reaction protocol.  All symptoms essentially resolved; the patient then developed a temperature to maximum 101.2.  Patient states that she has been experiencing intermittent fevers for the past 1-2 weeks.  She had some dysuria, urinary frequency, and urine dribbling the week before Christmas; and was prescribed some antibiotics.  Patient states she took the Anaprox for approximately 2 days; and was then told per primary care provider to stop taking antibiotics since urinalysis and culture revealed no significant infection.  Patient states she no longer has any dysuria; but does continue with urinary frequency and occasional dribbling of urine.  She denies any flank pain.  She does keep a chronic dry cough that she is associating with sinus drainage.  She denies any other new symptoms whatsoever.   HPI  CURRENT THERAPY: Upcoming Treatment Dates - NON-HODGKINS LYMPHOMA Rituximab D1 / Bendamustine D1,2 q28d Days with orders from any treatment category:  02/28/2014      SCHEDULING COMMUNICATION      ondansetron (ZOFRAN) IVPB 8 mg      dexamethasone (DECADRON) injection 10 mg      bendamustine (TREANDA) 135 mg in sodium chloride 0.9 % 500 mL chemo infusion      sodium chloride 0.9 % injection 10 mL      heparin lock flush 100 unit/mL      heparin lock flush 100 unit/mL      alteplase (CATHFLO ACTIVASE) injection 2  mg      sodium chloride 0.9 % injection 3 mL      0.9 %  sodium chloride infusion 03/27/2014      SCHEDULING COMMUNICATION      ondansetron (ZOFRAN) IVPB 8 mg      dexamethasone (DECADRON) injection 10 mg      acetaminophen (TYLENOL) tablet 650 mg      diphenhydrAMINE (BENADRYL) capsule 25 mg      riTUXimab (RITUXAN) 600 mg in sodium chloride 0.9 % 250 mL (1.9355 mg/mL) chemo infusion      bendamustine (TREANDA) 135 mg in sodium chloride 0.9 % 500 mL chemo infusion      sodium chloride 0.9 % injection 10 mL      heparin lock flush 100 unit/mL      heparin lock flush 100 unit/mL      alteplase (CATHFLO ACTIVASE) injection 2 mg      sodium chloride 0.9 % injection 3 mL      diphenhydrAMINE (BENADRYL) injection 25 mg      famotidine (PEPCID) IVPB 20 mg      0.9 %  sodium chloride infusion      methylPREDNISolone sodium succinate (SOLU-MEDROL) 125 mg/2 mL injection 125 mg      EPINEPHrine (ADRENALIN) 0.1 MG/ML injection 0.25 mg      EPINEPHrine (ADRENALIN) 0.1 MG/ML injection 0.25 mg      EPINEPHrine (ADRENALIN) injection 0.5 mg      EPINEPHrine (ADRENALIN) injection 0.5 mg      diphenhydrAMINE (BENADRYL) injection 50 mg  albuterol (PROVENTIL) (2.5 MG/3ML) 0.083% nebulizer solution 2.5 mg      0.9 %  sodium chloride infusion      TREATMENT CONDITIONS 03/28/2014      SCHEDULING COMMUNICATION      ondansetron (ZOFRAN) IVPB 8 mg      dexamethasone (DECADRON) injection 10 mg      bendamustine (TREANDA) 135 mg in sodium chloride 0.9 % 500 mL chemo infusion      sodium chloride 0.9 % injection 10 mL      heparin lock flush 100 unit/mL      heparin lock flush 100 unit/mL      alteplase (CATHFLO ACTIVASE) injection 2 mg      sodium chloride 0.9 % injection 3 mL      0.9 %  sodium chloride infusion    ROS  Past Medical History  Diagnosis Date  . Cancer   . Leukemia   . Lower back pain     Past Surgical History  Procedure Laterality Date  . Tonsillectomy    . Other surgical  history      removal of uterine lining    has CLL (chronic lymphocytic leukemia); Headache disorder; Vaginal discharge; Low grade squamous intraepithelial lesion (LGSIL) on Papanicolaou smear of cervix; Anemia in neoplastic disease; Pain with urination; SIRS (systemic inflammatory response syndrome); Fever; Hypersensitivity reaction; Infusion reaction; Hypotension; and Fever and chills on her problem list.     is allergic to penicillins.    Medication List       This list is accurate as of: 02/27/14  3:12 PM.  Always use your most recent med list.               acetaminophen 500 MG tablet  Commonly known as:  TYLENOL  Take 500 mg by mouth every 6 (six) hours as needed for headache.     acyclovir 400 MG tablet  Commonly known as:  ZOVIRAX  Take 400 mg by mouth daily.     allopurinol 300 MG tablet  Commonly known as:  ZYLOPRIM  Take 300 mg by mouth daily.     aspirin 81 MG tablet  Take 81 mg by mouth daily.     cholecalciferol 1000 UNITS tablet  Commonly known as:  VITAMIN D  Take 4,000 Units by mouth daily.     CITRACAL PO  Take 4 tablets by mouth daily.     dexamethasone 4 MG tablet  Commonly known as:  DECADRON  Take 4 mg by mouth 2 (two) times daily. After chemo     ELESTRIN 0.52 MG/0.87 GM (0.06%) Gel  Generic drug:  Estradiol  Apply 2 application topically See admin instructions. 2 pumps thinly to upper arm daily at bedtime     HYDROcodone-acetaminophen 5-325 MG per tablet  Commonly known as:  NORCO/VICODIN  Take 1 tablet by mouth every 6 (six) hours as needed for moderate pain.     ibuprofen 200 MG tablet  Commonly known as:  ADVIL,MOTRIN  Take 200 mg by mouth every 6 (six) hours as needed for fever or moderate pain.     lidocaine-prilocaine cream  Commonly known as:  EMLA  Apply 1 application topically as needed (for port). Apply nickel size amount to skin on top of port. Cover with saran  Wrap at least one hour prior to access.     LORazepam 0.5 MG  tablet  Commonly known as:  ATIVAN  Take 0.5 mg by mouth at bedtime as needed for anxiety.  omeprazole 20 MG capsule  Commonly known as:  PRILOSEC  Take 20 mg by mouth daily as needed (acid reflux).     ondansetron 4 MG tablet  Commonly known as:  ZOFRAN  Take 4 mg by mouth 2 (two) times daily as needed for nausea or vomiting.     PRESCRIPTION MEDICATION  Chemo [facility]     progesterone 200 MG capsule  Commonly known as:  PROMETRIUM  Take 200 mg by mouth See admin instructions. on days 1-12 every other month     traZODone 50 MG tablet  Commonly known as:  DESYREL  Take 50 mg by mouth at bedtime.         PHYSICAL EXAMINATION  Vitals: BP initially 108/58; then on recheck 140/80; and then decreased to 91/51 prior to transfer to floor as direct admit. HR 115, temp 101.2. Sat 98.  Physical Exam  Constitutional: She is oriented to person, place, and time. She appears unhealthy.  HENT:  Head: Normocephalic and atraumatic.  Mouth/Throat: Oropharynx is clear and moist.  Eyes: Conjunctivae and EOM are normal. Pupils are equal, round, and reactive to light. Right eye exhibits no discharge. Left eye exhibits no discharge. No scleral icterus.  Neck: Normal range of motion. Neck supple. No JVD present. No tracheal deviation present. No thyromegaly present.  Cardiovascular: Normal rate, regular rhythm, normal heart sounds and intact distal pulses.   Pulmonary/Chest: Effort normal and breath sounds normal. No stridor. No respiratory distress. She has no wheezes. She has no rales. She exhibits no tenderness.  Patient noted to have some occasional nonproductive cough on exam today.  Abdominal: Soft. Bowel sounds are normal. She exhibits no distension and no mass. There is no tenderness. There is no rebound and no guarding.  Musculoskeletal: Normal range of motion. She exhibits no edema or tenderness.  Lymphadenopathy:    She has no cervical adenopathy.  Neurological: She is alert and  oriented to person, place, and time. Gait normal.  Skin: Skin is warm and dry. No rash noted. No erythema. No pallor.  Psychiatric: Affect normal.  Nursing note and vitals reviewed.   LABORATORY DATA:. Orders Only on 02/27/2014  Component Date Value Ref Range Status  . Glucose 02/27/2014 Negative  Negative mg/dL Final  . Bilirubin (Urine) 02/27/2014 Negative  Negative Final  . Ketones 02/27/2014 Negative  Negative mg/dL Final  . Specific Gravity, Urine 02/27/2014 1.010  1.003 - 1.035 Final  . Blood 02/27/2014 Negative  Negative Final  . pH 02/27/2014 7.5  4.6 - 8.0 Final  . Protein 02/27/2014 30  Negative- <30 mg/dL Final  . Urobilinogen, UR 02/27/2014 0.2  0.2 - 1 mg/dL Final  . Nitrite 02/27/2014 Negative  Negative Final  . Leukocyte Esterase 02/27/2014 Negative  Negative Final  . RBC / HPF 02/27/2014 0-2  0 - 2 Final  . WBC, UA 02/27/2014 0-2  0 - 2 Final  . Bacteria, UA 02/27/2014 Trace  Negative- Trace Final  . Epithelial Cells 02/27/2014 Few  Negative- Few Final  . Amorphous, UA 02/27/2014 Moderate  Negative- Small Final     RADIOGRAPHIC STUDIES: Portable Chest 1 View  02/27/2014   CLINICAL DATA:  Fever and chills and weakness today the patient had chemotherapy for CLL today.  EXAM: PORTABLE CHEST - 1 VIEW  COMPARISON:  01/14/2012  FINDINGS: Power port in place. Heart size and pulmonary vascularity are normal. Lungs are clear. No effusions. No osseous abnormality.  IMPRESSION: No acute abnormalities.   Electronically Signed   By:  Rozetta Nunnery M.D.   On: 02/27/2014 17:05    ASSESSMENT/PLAN:    CLL (chronic lymphocytic leukemia) Patient has been undergoing Rituxan/bendamustine chemotherapy regimen.  Patient has been receiving Rituxan and bendamustine on day 1 of each cycle; and receiving bendamustine only on day 2 of each cycle.  Pt received approx 85 mls of Rituxan infusion prior to experiencing acute onset tachycardia, hypertension, and significant rigors.  Rituxan infusion  held.  Patient was given hypersensitivity protocol medications; which greatly improved all symptoms.  Patient then developed fever to maximum 101.2.  Urinalysis revealed no significant infection.  Due to questionable hypersensitivity reaction versus onset of fever of unknown origin-patient was then transported to the emergency department for further evaluation.   Patient did not complete the Rituxan infusion; and did not receive any of the bendamustine infusion today.  Will hold off further chemotherapy till next week.  Fever Patient states she has been experiencing intermittent fevers to maximum 102.1 for the past 1-2 weeks.  She states she did have a fever last night of 102.1.  She was initially afebrile when presenting to the [facility] today for her chemotherapy.  She received approximately 76 ML's of her Rituxan chemotherapy infusion when she was noted to have a questionable hypersensitivity reaction which consisted of elevated vital signs and significant rigors.  Rituxan infusion was held; and hypersensitivity reaction symptoms managed per hypersensitivity protocol medications.  Patient then developed a fever to maximum of 101.2 while at the cancer center.  Urinalysis obtained earlier this morning reveal no evidence of infection.  Urine culture results still pending.  Blood cultures 2 obtained this morning still pending as well.  Difficult to ascertain if  elevated vital signs and rigors noted this morning were actually a hypersensitivity reaction; versus the onset of fever. Pt does not appear toxic.    Patient will be direct admitted to the telemetry floor for further evaluation per the hospitalist.  Gave brief report and history to Dr. Bonnielee Haff hospitalist.  Patient was then transported via [place] to assigned telemetry floor bed.   Hypersensitivity reaction Patient had received approximately 85 ML of the Rituxan chemotherapy infusion when she developed tachycardia,  hypertension, and significant rigors.  Rituxan infusion was held; and patient was given Benadryl, Pepcid, and Solu-Medrol per hypersensitivity protocol.  Patient was also given Demerol 12.5 mg IV which resolved all rigors.    While monitoring patient closely-patient did develop a temperature to maximum of 101.2.  Patient states she has been suffering with intermittent fevers for the past 1-2 weeks.  Urinalysis revealed no obvious infection.  Awaiting both urine culture and blood cultures 2.  Difficult to determine whether patient actually experienced a hypersensitivity reaction versus onset of fever of unspecified origin.  Patient will be transported to the emergency department for further evaluation.  Patient did not receive any further Rituxan; and received none of the bendamustine.    Note: Pt appeared in no acute distress while awaiting transfer to direct admit Telemetry bed; eating sandwich while in room and walking back/forth to bathroom with no assist.   Patient stated understanding of all instructions; and was in agreement with this plan of care. The patient knows to call the clinic with any problems, questions or concerns.   Review/collaboration with Dr. Jana Hakim regarding all aspects of patient's visit today.   Total time spent with patient was 40 minutes;  with greater than 75 percent of that time spent in face to face counseling regarding her symptoms,  and coordination of care and follow up.  Disclaimer: This note was dictated with voice recognition software. Similar sounding words can inadvertently be transcribed and may not be corrected upon review.   Drue Second, NP 02/27/2014

## 2014-02-27 NOTE — Assessment & Plan Note (Addendum)
Patient has been undergoing Rituxan/bendamustine chemotherapy regimen.  Patient has been receiving Rituxan and bendamustine on day 1 of each cycle; and receiving bendamustine only on day 2 of each cycle.  Pt received approx 85 mls of Rituxan infusion prior to experiencing acute onset tachycardia, hypertension, and significant rigors.  Rituxan infusion held.  Patient was given hypersensitivity protocol medications; which greatly improved all symptoms.  Patient then developed fever to maximum 101.2.  Urinalysis revealed no significant infection.  Due to questionable hypersensitivity reaction versus onset of fever of unknown origin-patient was then transported to the emergency department for further evaluation.   Patient did not complete the Rituxan infusion; and did not receive any of the bendamustine infusion today.  Will hold off further chemotherapy till next week.

## 2014-02-27 NOTE — Progress Notes (Signed)
Pt transferred to room 1425 at [facility].  Pt left unit via [place], escorted by RN

## 2014-02-27 NOTE — Progress Notes (Signed)
Pt entered clinic today for treatments.  C/O fever all weekend with high temperature of 102.3. Dr Jana Hakim called   Permission  Given to treat patient today.   Blood cultures x 2 ordered Will collect urine culture when pt voids.

## 2014-02-27 NOTE — Patient Instructions (Signed)
Call office if you continue to run a fever. [facility] Discharge Instructions for Patients Receiving Chemotherapy  Today you received the following chemotherapy agents Rituxan/Treanda.   To help prevent nausea and vomiting after your treatment, we encourage you to take your nausea medication as directed.    If you develop nausea and vomiting that is not controlled by your nausea medication, call the clinic.   BELOW ARE SYMPTOMS THAT SHOULD BE REPORTED IMMEDIATELY:  *FEVER GREATER THAN 100.5 F  *CHILLS WITH OR WITHOUT FEVER  NAUSEA AND VOMITING THAT IS NOT CONTROLLED WITH YOUR NAUSEA MEDICATION  *UNUSUAL SHORTNESS OF BREATH  *UNUSUAL BRUISING OR BLEEDING  TENDERNESS IN MOUTH AND THROAT WITH OR WITHOUT PRESENCE OF ULCERS  *URINARY PROBLEMS  *BOWEL PROBLEMS  UNUSUAL RASH Items with * indicate a potential emergency and should be followed up as soon as possible.  Feel free to call the clinic you have any questions or concerns. The clinic phone number is [phone].

## 2014-02-27 NOTE — Assessment & Plan Note (Signed)
Patient had received approximately 31 ML of the Rituxan chemotherapy infusion when she developed tachycardia, hypertension, and significant rigors.  Rituxan infusion was held; and patient was given Benadryl, Pepcid, and Solu-Medrol per hypersensitivity protocol.  Patient was also given Demerol 12.5 mg IV which resolved all rigors.    While monitoring patient closely-patient did develop a temperature to maximum of 101.2.  Patient states she has been suffering with intermittent fevers for the past 1-2 weeks.  Urinalysis revealed no obvious infection.  Awaiting both urine culture and blood cultures 2.  Difficult to determine whether patient actually experienced a hypersensitivity reaction versus onset of fever of unspecified origin.  Patient will be transported to the emergency department for further evaluation.  Patient did not receive any further Rituxan; and received none of the bendamustine.

## 2014-02-27 NOTE — Progress Notes (Signed)
At 1049, Pt c/o chills, rigor noted..rigor noted  C/o  Pain  Back,  Rituxan stopped Pepcid 20 mg given IV. [facility]ent notified.  NS hung wide open per protocol.

## 2014-02-27 NOTE — Assessment & Plan Note (Signed)
Patient states she has been experiencing intermittent fevers to maximum 102.1 for the past 1-2 weeks.  She states she did have a fever last night of 102.1.  She was initially afebrile when presenting to the [facility] today for her chemotherapy.  She received approximately 80 ML's of her Rituxan chemotherapy infusion when she was noted to have a questionable hypersensitivity reaction which consisted of elevated vital signs and significant rigors.  Rituxan infusion was held; and hypersensitivity reaction symptoms managed per hypersensitivity protocol medications.  Patient then developed a fever to maximum of 101.2 while at the cancer center.  Urinalysis obtained earlier this morning reveal no evidence of infection.  Urine culture results still pending.  Blood cultures 2 obtained this morning still pending as well.  Difficult to ascertain if  elevated vital signs and rigors noted this morning were actually a hypersensitivity reaction; versus the onset of fever. Pt does not appear toxic.    Patient will be direct admitted to the telemetry floor for further evaluation per the hospitalist.  Gave brief report and history to Dr. Bonnielee Haff hospitalist.  Patient was then transported via [place] to assigned telemetry floor bed.

## 2014-02-27 NOTE — Telephone Encounter (Signed)
Pt assigned to room [place] room 17

## 2014-02-27 NOTE — H&P (Signed)
[facility] History and Physical  Rachel [name] KGY:[id number] DOB: 1951-01-24 DOA: 02/27/2014  Referring physician:  PCP: Wenda Low, MD   Chief Complaint: Fever, rigors, hypotension following infusion of chemotherapy.  HPI: Rachel [name] is a 64 y.o. 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 Jana Hakim who was admitted as a direct admit from the [facility] secondary to chills/rigors, fever and hypotension while undergoing infusion of Rituxan. Patient at that time was also noted to have some shortness of breath. Patient states she's been having fevers over the past 1 week with temperatures going as high as 102.3 with some associated chills nausea and did have suprapubic abdominal pain 1 week prior to admission. Prior to day of admission patient denies any chest pain, no shortness of breath, no dysuria, no diarrhea, no constipation, no weakness, no melena, no hematemesis, no hematochezia. Patient does endorse some urinary hesitancy. Patient also endorses cough with postnasal drip as well as a headache in the posterior head. Patient states she was seen at her PCPs office or one of his partners 8 days prior to admission urinalysis was done at that time and concern initially for UTI and patient was placed on antibiotics. Patient stated that 2 days later she was called to stop the antibiotic as urine cultures were negative. Blood cultures were drawn at the cancer center and urinalysis was also obtained which was unremarkable. [facility] were called to admit the patient for further evaluation and management.   Review of Systems: As per history of present illness otherwise negative. Constitutional:  No weight loss, night sweats, Fevers, chills, fatigue.  HEENT:  No headaches, Difficulty swallowing,Tooth/dental problems,Sore throat,  No sneezing,  itching, ear ache, nasal congestion, post nasal drip,  Cardio-vascular:  No chest pain, Orthopnea, PND, swelling in lower extremities, anasarca, dizziness, palpitations  GI:  No heartburn, indigestion, abdominal pain, nausea, vomiting, diarrhea, change in bowel habits, loss of appetite  Resp:  No shortness of breath with exertion or at rest. No excess mucus, no productive cough, No non-productive cough, No coughing up of blood.No change in color of mucus.No wheezing.No chest wall deformity  Skin:  no rash or lesions.  GU:  no dysuria, change in color of urine, no urgency or frequency. No flank pain.  Musculoskeletal:  No joint pain or swelling. No decreased range of motion. No back pain.  Psych:  No change in mood or affect. No depression or anxiety. No memory loss.   Past Medical History  Diagnosis Date  . Cancer   . Leukemia   . Lower back pain    Past Surgical History  Procedure Laterality Date  . Tonsillectomy    . Other surgical history      removal of uterine lining   Social History:  reports that she has never smoked. She has never used smokeless tobacco. She reports that she does not drink alcohol or use illicit drugs.  Allergies  Allergen Reactions  . Penicillins     "drops my heartbeat" "I just pass out"    Family History  Problem Relation Age of Onset  . Depression Son      Prior to Admission medications   Medication Sig Start Date End Date Taking? Authorizing Provider  acetaminophen (TYLENOL) 500 MG tablet Take 500 mg by mouth every 6 (six) hours as needed for headache.    Historical Provider, MD  acyclovir (ZOVIRAX) 400 MG tablet Take  400 mg by mouth daily.    Historical Provider, MD  allopurinol (ZYLOPRIM) 300 MG tablet Take 300 mg by mouth daily.    Historical Provider, MD  aspirin 81 MG tablet Take 81 mg by mouth daily.    Historical Provider, MD  Calcium Citrate (CITRACAL PO) Take 4 tablets by mouth daily.     Historical Provider, MD  cholecalciferol  (VITAMIN D) 1000 UNITS tablet Take 4,000 Units by mouth daily.    Historical Provider, MD  dexamethasone (DECADRON) 4 MG tablet Take 4 mg by mouth 2 (two) times daily. After chemo    Historical Provider, MD  Estradiol (ELESTRIN) 0.52 MG/0.87 GM (0.06%) GEL Apply 2 application topically See admin instructions. 2 pumps thinly to upper arm daily at bedtime    Historical Provider, MD  HYDROcodone-acetaminophen (NORCO/VICODIN) 5-325 MG per tablet Take 1 tablet by mouth every 6 (six) hours as needed for moderate pain. Patient not taking: Reported on 02/01/2014 12/30/13   Chauncey Cruel, MD  lidocaine-prilocaine (EMLA) cream Apply 1 application topically as needed (for port). Apply nickel size amount to skin on top of port. Cover with saran  Wrap at least one hour prior to access.    Historical Provider, MD  LORazepam (ATIVAN) 0.5 MG tablet Take 0.5 mg by mouth at bedtime as needed for anxiety.    Historical Provider, MD  omeprazole (PRILOSEC) 20 MG capsule Take 20 mg by mouth daily.    Historical Provider, MD  ondansetron (ZOFRAN) 4 MG tablet Take 4 mg by mouth 2 (two) times daily as needed for nausea or vomiting.    Historical Provider, MD  progesterone (PROMETRIUM) 200 MG capsule Take 200 mg by mouth See admin instructions. on days 1-12 every other month    Historical Provider, MD  traZODone (DESYREL) 50 MG tablet Take 50 mg by mouth at bedtime.    Historical Provider, MD   Physical Exam: Filed Vitals:   02/27/14 1556  BP: 84/49  Pulse: 76  Temp: 98.1 F (36.7 C)  TempSrc: Oral  Resp: 20  SpO2: 99%    Wt Readings from Last 3 Encounters:  02/23/14 53.298 kg (117 lb 8 oz)  02/01/14 52.844 kg (116 lb 8 oz)  01/31/14 52.617 kg (116 lb)    General:  Pleasant well-developed well-nourished female in no acute cardiopulmonary distress. Speaking in full sentences.  Eyes: PERRLA, EOMI, normal lids, irises & conjunctiva ENT: grossly normal hearing, lips & tongue Neck: no LAD, masses or  thyromegaly Cardiovascular: RRR, no m/r/g. No LE edema. Port-A-Cath in right upper chest wall with no erythema, no warmth, no tenderness to palpation. Respiratory: CTA bilaterally, no w/r/r. No wheezes, no crackles, no rhonchi. Normal respiratory effort. Abdomen: soft, ntnd, positive bowel sounds, no rebound, no guarding. Skin: no rash or induration seen on limited exam Musculoskeletal: grossly normal tone BUE/BLE Psychiatric: grossly normal mood and affect, speech fluent and appropriate Neurologic: Alert and oriented 3. CN 2 through 12 are grossly intact. Visual fields intact. Sensation is intact. Visual fields are intact. Gait not tested secondary to safety. grossly non-focal.          Labs on Admission:  Basic Metabolic Panel:  Recent Labs Lab 02/23/14 1347  NA 141  K 4.2  CO2 30*  GLUCOSE 108  BUN 10.5  CREATININE 1.0  CALCIUM 9.1   Liver Function Tests:  Recent Labs Lab 02/23/14 1347  AST 44*  ALT 46  ALKPHOS 106  BILITOT 0.32  PROT 5.8*  ALBUMIN 3.4*  No results for input(s): LIPASE, AMYLASE in the last 168 hours. No results for input(s): AMMONIA in the last 168 hours. CBC:  Recent Labs Lab 02/23/14 1344  WBC 54.5*  NEUTROABS 2.5  HGB 10.7*  HCT 34.8  MCV 96.3  PLT 126*   Cardiac Enzymes: No results for input(s): CKTOTAL, CKMB, CKMBINDEX, TROPONINI in the last 168 hours.  BNP (last 3 results) No results for input(s): PROBNP in the last 8760 hours. CBG: No results for input(s): GLUCAP in the last 168 hours.  Radiological Exams on Admission: No results found.  EKG: None  Assessment/Plan Principal Problem:   SIRS (systemic inflammatory response syndrome) Active Problems:   Infusion reaction   CLL (chronic lymphocytic leukemia)   Low grade squamous intraepithelial lesion (LGSIL) on Papanicolaou smear of cervix   Anemia in neoplastic disease   Hypotension   Fever and chills  #1 SIRS versus infusion reaction Patient noted to have fever,  chills, rigors and some shortness of breath, while receiving infusion of Rituxan. Patient also states has had fevers over the past week and was initially felt to have a urinary tract infection and was partially treated with antibiotics but some associated suprapubic tenderness. Admit the patient to telemetry. Patient is currently speaking in full sentences and protecting her airway. Will check comprehensive metabolic profile. Check a CBC with differential. Check a UA with cultures and sensitivities. Check a chest x-ray. Blood cultures have already been drawn in the cancer center. Check a lactic acid level. Check a pro calcitonin level. Will place patient on Pepcid 20 mg IV every 12 hours, Benadryl 50 mg IV daily, Solu-Medrol 125 mg IV every 12 hours. Tylenol 500 mg 3 times daily. Placed empirically on IV vancomycin and IV cefepime.  #2 fever and chills Questionable etiology. Patient has been having fever over the past week. Initially one week prior to admission those concern for urinary tract infection with patient was partially treated for as urine cultures came back negative and antibiotics were discontinued. Blood cultures have been obtained. Check a chest x-ray. Check a lactic acid level. Check a procalcitonin level. Placed empirically on IV vancomycin IV cefepime. Follow.  #3 hypotension Likely secondary to problem #1. Patient with no overt bleeding. Patient has been pancultured. Check a chest x-ray. Hydrate with IV fluids. Follow. Patient does run on the low side of blood pressures in the mid to high 90s. Follow.  #4 anemia of neoplastic disease Check a CBC. Follow H&H.  #5 CLL Check a CBC, check a comprehensive metabolic profile. Will inform oncology via Epic of patient's admission.  #6 prophylaxis Pepcid for GI prophylaxis. Lovenox for DVT prophylaxis.   Code Status: Full DVT Prophylaxis: Lovenox Family Communication: Updated patient and husband. Disposition Plan: Admit to  telemetry  Time spent: 62 minutes  [facility] M.D. [facility] Pager [phone]

## 2014-02-27 NOTE — Progress Notes (Signed)
ANTIBIOTIC CONSULT NOTE - INITIAL  Pharmacy Consult for vancomycin/levofloxacin Indication: rule out sepsis  Allergies  Allergen Reactions  . Penicillins     "drops my heartbeat" "I just pass out"    Patient Measurements:   Adjusted Body Weight:   Vital Signs: Temp: 98.1 F (36.7 C) (01/04 1556) Temp Source: Oral (01/04 1556) BP: 84/49 mmHg (01/04 1556) Pulse Rate: 76 (01/04 1556) Intake/Output from previous day:   Intake/Output from this shift:    Labs: No results for input(s): WBC, HGB, PLT, LABCREA, CREATININE in the last 72 hours. Estimated Creatinine Clearance: 41.8 mL/min (by C-G formula based on Cr of 1). No results for input(s): VANCOTROUGH, VANCOPEAK, VANCORANDOM, GENTTROUGH, GENTPEAK, GENTRANDOM, TOBRATROUGH, TOBRAPEAK, TOBRARND, AMIKACINPEAK, AMIKACINTROU, AMIKACIN in the last 72 hours.   Microbiology: Recent Results (from the past 720 hour(s))  SureSwab, Vaginosis/Vaginitis Plus     Status: None   Collection Time: 01/31/14  4:27 PM  Result Value Ref Range Status   C. trachomatis RNA, TMA Not Detected Not Detected Final   N. gonorrhoeae RNA, TMA Not Detected Not Detected Final    Comment: This test was performed using the APTIMA COMBO2(R) Assay (GEN-PROBE(R)). The analytical performance characteristics of this assay, when used to test SurePath(R) specimens have been determined by [employer].    BV CATEGORY REPORT  Final    Comment: BV Category                NOT SUPPORTIVE                      Reference range:  NOT SUPPORTIVE    LACTOBACILLUS SPECIES 6.9 Log (cells/mL) Final   Atopobium vaginae Not Detected Log (cells/mL) Final   MEGASPHAERA SPECIES Not Detected Log (cells/mL) Final   Gardnerella vaginalis Not Detected Log (cells/mL) Final    Comment: NOT SUPPORTIVE OF BV: The pattern of results is not supportive of a diagnosis of BV: 1)Presence of Lactobacillus spp., G. vaginalis levels less than 6.0 log cells/mL, and absence of A. vaginae and  Megasphaera spp; or 2)Absence of all targeted organisms; or 3)Absence of Lactobacillus spp.  plus G. vaginalis detected at levels less than 6.0 log cells/mL and absence of A. vaginae and Megasphaera spp. EQUIVOCAL FOR BV: The pattern of results is neither supportive nor not supportive of a diagnosis of BV. The patient may be in transition into or out of BV: Presence of Lactobacillus spp. plus G. vaginalis (greater or equal to 6.0 log cells/mL) and/or one of the other BV-associated pathogens. SUPPORTIVE OF BV: The pattern of results is supportive of a diagnosis of BV: Absence of Lactobacillus spp. and presence of G. vaginalis greater than or equal to 6.0 log cells/mL and/or one or both of the other BV-associated pathogens. Concentration for Lactobacilli (L. acidophilus/ cri spatus, L. jensenii) are collectively reported under the term "Lactobacillus spp.", as these species are among the peroxide producing Lactobacilli thought to be protective against bacterial vaginosis. Atopobium vaginae, Megasphaera spp., and Gardnerella (greater than 6.0 log cells/mL) have been associated with vaginosis when present in the absence of peroxide producing Lactobacilli. This test was developed and its analytical performance characteristics have been determined by [employer], [place], [place]. It has not been cleared or approved by the FDA. This assay has been validated pursuant to the CLIA regulations and is used for clinical purposes.    T. vaginalis RNA, QL TMA Not Detected Not Detected Final    Comment: This test was performed using  the APTIMA Trichomonas vaginalis Assay (Gen-Probe). For more information on this test, go to http://education.questdiagnostics.com/faq/ Trichomonastma    C. albicans, DNA Not Detected Not Detected Final   C. glabrata, DNA Not Detected Not Detected Final   C. tropicalis, DNA Not Detected Not Detected Final   C. parapsilosis, DNA Not  Detected Not Detected Final    Comment: This test was developed and its analytical performance characteristics have been determined by [employer], [place], [place]. It has not been cleared or approved by the FDA. This assay has been validated pursuant to the CLIA regulations and is used for clinical purposes.   TECHNOLOGIST REVIEW     Status: None   Collection Time: 02/01/14  1:50 PM  Result Value Ref Range Status   Technologist Review   Final    Variant lymphs present- some with  nucleoli, many smudge cells   TECHNOLOGIST REVIEW     Status: None   Collection Time: 02/09/14  2:19 PM  Result Value Ref Range Status   Technologist Review   Final    Variant lymphs, some with nucleoli, and many smudge cells present  TECHNOLOGIST REVIEW     Status: None   Collection Time: 02/23/14  1:44 PM  Result Value Ref Range Status   Technologist Review   Final    Variant lymphs present some with nucleoli, moderate smudge cells    Medical History: Past Medical History  Diagnosis Date  . Cancer   . Leukemia   . Lower back pain     Assessment: 54 YOF  With CLL who was directly admitted to hospital following chills/rigors, hypotension and fever undergoing Rituxan infusion.  Per patient, she has been having fevers for last week (tmax 102.3 at home) w/ chills an nausea.  Placed on cipro 12/29 ~8 days ago for ? UTI but instructed to stop as urine culture returned no growth.    Orders to start Vancomycin and Cefepime (no doses given) to levofloxacin d/t anaphylaxis d/t PCN.     1/4 >> vancomycin  >> 1/4 >> Cefepime  >>    Tmax: 101 WBCs: on 12/31 -elevated with CLL Renal: on 12/31 - SCr WNL with normalized CrCl = 22ml/min  1/4 blood: 1/4 urine:   Goal of Therapy:  Vancomycin trough level 15-20 mcg/ml  Plan:   Vancomycin 1gm IV x 1 then 500mg  IV q12h  Check steady state level  Follow renal function  Levofloxacin 750mg  q48h d/t PCN allergy  Doreene Eland,  PharmD, BCPS.   Pager: [phone]  02/27/2014,4:53 PM

## 2014-02-28 ENCOUNTER — Telehealth: Payer: Self-pay | Admitting: Nurse Practitioner

## 2014-02-28 ENCOUNTER — Other Ambulatory Visit: Payer: Self-pay | Admitting: Oncology

## 2014-02-28 ENCOUNTER — Telehealth: Payer: Self-pay | Admitting: Oncology

## 2014-02-28 ENCOUNTER — Ambulatory Visit: Payer: Medicare Other

## 2014-02-28 DIAGNOSIS — T8090XD Unspecified complication following infusion and therapeutic injection, subsequent encounter: Secondary | ICD-10-CM

## 2014-02-28 LAB — CBC
HEMATOCRIT: 26.5 % — AB (ref 36.0–46.0)
HEMOGLOBIN: 8.8 g/dL — AB (ref 12.0–15.0)
MCH: 31.7 pg (ref 26.0–34.0)
MCHC: 33.2 g/dL (ref 30.0–36.0)
MCV: 95.3 fL (ref 78.0–100.0)
Platelets: 68 10*3/uL — ABNORMAL LOW (ref 150–400)
RBC: 2.78 MIL/uL — ABNORMAL LOW (ref 3.87–5.11)
RDW: 15.1 % (ref 11.5–15.5)
WBC: 28.5 10*3/uL — ABNORMAL HIGH (ref 4.0–10.5)

## 2014-02-28 LAB — BASIC METABOLIC PANEL
ANION GAP: 1 — AB (ref 5–15)
BUN: 10 mg/dL (ref 6–23)
CO2: 22 mmol/L (ref 19–32)
Calcium: 7.2 mg/dL — ABNORMAL LOW (ref 8.4–10.5)
Chloride: 115 mEq/L — ABNORMAL HIGH (ref 96–112)
Creatinine, Ser: 0.61 mg/dL (ref 0.50–1.10)
GFR calc non Af Amer: >90 mL/min (ref >90)
Glucose, Bld: 152 mg/dL — ABNORMAL HIGH (ref 70–99)
Potassium: 3.6 mmol/L (ref 3.5–5.1)
Sodium: 139 mmol/L (ref 135–145)

## 2014-02-28 LAB — VITAMIN B12: VITAMIN B 12: 619 pg/mL (ref 211–911)

## 2014-02-28 LAB — IRON AND TIBC
IRON: 52 ug/dL (ref 42–145)
Saturation Ratios: 18 % — ABNORMAL LOW (ref 20–55)
TIBC: 284 ug/dL (ref 250–470)
UIBC: 232 ug/dL (ref 125–400)

## 2014-02-28 LAB — FOLATE: Folate: 12.9 ng/mL

## 2014-02-28 LAB — GLUCOSE, CAPILLARY: GLUCOSE-CAPILLARY: 127 mg/dL — AB (ref 70–99)

## 2014-02-28 LAB — FERRITIN: Ferritin: 48 ng/mL (ref 10–291)

## 2014-02-28 LAB — PATHOLOGIST SMEAR REVIEW

## 2014-02-28 LAB — RETICULOCYTES
RBC.: 2.75 MIL/uL — ABNORMAL LOW (ref 3.87–5.11)
RETIC COUNT ABSOLUTE: 41.3 10*3/uL (ref 19.0–186.0)
RETIC CT PCT: 1.5 % (ref 0.4–3.1)

## 2014-02-28 MED ORDER — FAMOTIDINE 20 MG PO TABS
20.0000 mg | ORAL_TABLET | Freq: Every day | ORAL | Status: DC
  Administered 2014-02-28: 20 mg via ORAL
  Filled 2014-02-28: qty 1

## 2014-02-28 MED ORDER — FAMOTIDINE 20 MG PO TABS: 20.0000 mg | ORAL_TABLET | Freq: Every day | ORAL | Status: DC

## 2014-02-28 MED ORDER — SODIUM CHLORIDE 0.45 % IV SOLN
INTRAVENOUS | Status: DC
  Administered 2014-02-28: 11:00:00 via INTRAVENOUS
  Filled 2014-02-28 (×2): qty 1000

## 2014-02-28 MED ORDER — PREDNISONE 50 MG PO TABS
60.0000 mg | ORAL_TABLET | Freq: Every day | ORAL | Status: DC
  Filled 2014-02-28: qty 1

## 2014-02-28 MED ORDER — HEPARIN SOD (PORK) LOCK FLUSH 100 UNIT/ML IV SOLN
500.0000 [IU] | INTRAVENOUS | Status: AC | PRN
  Administered 2014-02-28: 500 [IU]

## 2014-02-28 MED ORDER — LEVOFLOXACIN IN D5W 750 MG/150ML IV SOLN
750.0000 mg | INTRAVENOUS | Status: DC
  Administered 2014-02-28: 750 mg via INTRAVENOUS
  Filled 2014-02-28: qty 150

## 2014-02-28 MED ORDER — SODIUM CHLORIDE 0.9 % IJ SOLN
10.0000 mL | INTRAMUSCULAR | Status: DC | PRN
  Administered 2014-02-28: 10 mL

## 2014-02-28 MED ORDER — POTASSIUM CHLORIDE CRYS ER 20 MEQ PO TBCR
40.0000 meq | EXTENDED_RELEASE_TABLET | Freq: Once | ORAL | Status: AC
  Administered 2014-02-28: 40 meq via ORAL
  Filled 2014-02-28: qty 2

## 2014-02-28 MED ORDER — LEVOFLOXACIN 500 MG PO TABS: 500.0000 mg | ORAL_TABLET | Freq: Every day | ORAL | Status: DC

## 2014-02-28 MED ORDER — METHYLPREDNISOLONE SODIUM SUCC 125 MG IJ SOLR: 125.0000 mg | INTRAMUSCULAR | Status: DC

## 2014-02-28 MED ORDER — PREDNISONE 20 MG PO TABS: 60.0000 mg | ORAL_TABLET | Freq: Every day | ORAL | Status: DC

## 2014-02-28 NOTE — Telephone Encounter (Signed)
per GM DO NOT sch anything now. He will wait for pt to be discharged and THEN r/s appts and trmts-adv GM of pof-states no r/s at this time

## 2014-02-28 NOTE — Progress Notes (Signed)
ANTIBIOTIC CONSULT NOTE - INITIAL  Pharmacy Consult for vancomycin/levofloxacin Indication: rule out sepsis; SIRS  Allergies  Allergen Reactions  . Penicillins Anaphylaxis    "stopped breathing" "drops my heartbeat" "I just pass out"    Patient Measurements: Height: 4' 10.5" (148.6 cm) Weight: 125 lb 3.5 oz (56.8 kg) IBW/kg (Calculated) : 42.05  Vital Signs: Temp: 97.6 F (36.4 C) (01/05 0504) Temp Source: Oral (01/05 0504) BP: 96/45 mmHg (01/05 0504) Pulse Rate: 51 (01/05 0504) Intake/Output from previous day:   Intake/Output from this shift:    Labs:  Recent Labs  02/27/14 1705 02/28/14 0525  WBC 31.3* 28.5*  HGB 10.2* 8.8*  PLT 90* 68*  CREATININE 0.82 0.61   Estimated Creatinine Clearance: 53.1 mL/min (by C-G formula based on Cr of 0.61). No results for input(s): VANCOTROUGH, VANCOPEAK, VANCORANDOM, GENTTROUGH, GENTPEAK, GENTRANDOM, TOBRATROUGH, TOBRAPEAK, TOBRARND, AMIKACINPEAK, AMIKACINTROU, AMIKACIN in the last 72 hours.   Microbiology: Recent Results (from the past 720 hour(s))  SureSwab, Vaginosis/Vaginitis Plus     Status: None   Collection Time: 01/31/14  4:27 PM  Result Value Ref Range Status   C. trachomatis RNA, TMA Not Detected Not Detected Final   N. gonorrhoeae RNA, TMA Not Detected Not Detected Final    Comment: This test was performed using the APTIMA COMBO2(R) Assay (GEN-PROBE(R)). The analytical performance characteristics of this assay, when used to test SurePath(R) specimens have been determined by [employer].    BV CATEGORY REPORT  Final    Comment: BV Category                NOT SUPPORTIVE                      Reference range:  NOT SUPPORTIVE    LACTOBACILLUS SPECIES 6.9 Log (cells/mL) Final   Atopobium vaginae Not Detected Log (cells/mL) Final   MEGASPHAERA SPECIES Not Detected Log (cells/mL) Final   Gardnerella vaginalis Not Detected Log (cells/mL) Final    Comment: NOT SUPPORTIVE OF BV: The pattern of results is  not supportive of a diagnosis of BV: 1)Presence of Lactobacillus spp., G. vaginalis levels less than 6.0 log cells/mL, and absence of A. vaginae and Megasphaera spp; or 2)Absence of all targeted organisms; or 3)Absence of Lactobacillus spp.  plus G. vaginalis detected at levels less than 6.0 log cells/mL and absence of A. vaginae and Megasphaera spp. EQUIVOCAL FOR BV: The pattern of results is neither supportive nor not supportive of a diagnosis of BV. The patient may be in transition into or out of BV: Presence of Lactobacillus spp. plus G. vaginalis (greater or equal to 6.0 log cells/mL) and/or one of the other BV-associated pathogens. SUPPORTIVE OF BV: The pattern of results is supportive of a diagnosis of BV: Absence of Lactobacillus spp. and presence of G. vaginalis greater than or equal to 6.0 log cells/mL and/or one or both of the other BV-associated pathogens. Concentration for Lactobacilli (L. acidophilus/ cri spatus, L. jensenii) are collectively reported under the term "Lactobacillus spp.", as these species are among the peroxide producing Lactobacilli thought to be protective against bacterial vaginosis. Atopobium vaginae, Megasphaera spp., and Gardnerella (greater than 6.0 log cells/mL) have been associated with vaginosis when present in the absence of peroxide producing Lactobacilli. This test was developed and its analytical performance characteristics have been determined by [employer], [place], [place]. It has not been cleared or approved by the FDA. This assay has been validated pursuant to the CLIA regulations and is used  for clinical purposes.    T. vaginalis RNA, QL TMA Not Detected Not Detected Final    Comment: This test was performed using the APTIMA Trichomonas vaginalis Assay (Gen-Probe). For more information on this test, go to http://education.questdiagnostics.com/faq/ Trichomonastma    C. albicans, DNA Not Detected Not  Detected Final   C. glabrata, DNA Not Detected Not Detected Final   C. tropicalis, DNA Not Detected Not Detected Final   C. parapsilosis, DNA Not Detected Not Detected Final    Comment: This test was developed and its analytical performance characteristics have been determined by [employer], [place], [place]. It has not been cleared or approved by the FDA. This assay has been validated pursuant to the CLIA regulations and is used for clinical purposes.   TECHNOLOGIST REVIEW     Status: None   Collection Time: 02/01/14  1:50 PM  Result Value Ref Range Status   Technologist Review   Final    Variant lymphs present- some with  nucleoli, many smudge cells   TECHNOLOGIST REVIEW     Status: None   Collection Time: 02/09/14  2:19 PM  Result Value Ref Range Status   Technologist Review   Final    Variant lymphs, some with nucleoli, and many smudge cells present  TECHNOLOGIST REVIEW     Status: None   Collection Time: 02/23/14  1:44 PM  Result Value Ref Range Status   Technologist Review   Final    Variant lymphs present some with nucleoli, moderate smudge cells    Medical History: Past Medical History  Diagnosis Date  . Cancer   . Leukemia   . Lower back pain     Assessment: 64 y/o F with CLL who was directly admitted to hospital following chills/rigors, hypotension and fever undergoing Rituxan infusion.  Per patient, she has been having fevers for last week (Tmax 102.3 at home) w/chills and nausea.  Placed on cipro 12/29 ~8 days ago for ? UTI but instructed to stop as urine culture returned no growth. Orders to start Vancomycin and Levofloxacin d/t anaphylaxis with PCN.     1/4 >> Vancomycin >> 1/4 >> Levofloxacin >>    Tmax: afebrile WBCs: elevated with CLL Renal: SCr 0.61, CrCl ~ 53 mL/min CG PCT: 3.87 (on 1/4) Lactic acid: 2.4 (on 1/4)  1/4 blood: not collected 1/4 urine: sent  Goal of Therapy:  Vancomycin trough level 15-20 mcg/ml  Appropriate  antibiotic dosing for renal function and indication Eradication of infection  Plan:   Continue Vancomycin 500mg  IV q12h  Plan for Vancomycin trough level at steady state.  Change Levofloxacin to 750 mg IV q24h with CrCl > 50 mL/min.  Monitor QT interval closely, as patient also on concurrent Trazodone therapy..  Monitor renal function, cultures, clinical course.   Lindell Spar, PharmD, BCPS Pager: [phone] 02/28/2014 3:07 PM

## 2014-02-28 NOTE — Evaluation (Signed)
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 Jana Hakim who was admitted as a direct admit from the [place] secondary to chills/rigors, fever and hypotension while undergoing infusion of Rituxan. Patient at that time was also noted to have some shortness of breath.  Clinical Impression  Pt presents with no balance deficits and performed all ADL bathing and dressing at mod I level with UE support and no overt LOB.  Pt will not require any follow up services and PT to sign off at this time.      Follow Up Recommendations No PT follow up    Equipment Recommendations  None recommended by PT    Recommendations for Other Services       Precautions / Restrictions Precautions Precautions: None Restrictions Weight Bearing Restrictions: No      Mobility  Bed Mobility Overal bed mobility: Modified Independent                Transfers Overall transfer level: Modified independent                  Ambulation/Gait Ambulation/Gait assistance: Modified independent (Device/Increase time)         Gait velocity interpretation: at or above normal speed for age/gender General Gait Details: Pt with good gait speed and no LOB during gait.   Stairs            Wheelchair Mobility    Modified Rankin (Stroke Patients Only)       Balance Overall balance assessment: Modified Independent (uses UE for support)                                           Pertinent Vitals/Pain Pain Assessment: No/denies pain    Home Living Family/patient expects to be discharged to:: Private residence Living Arrangements: Spouse/significant other   Type of Home: House Home Access: Stairs to  enter Entrance Stairs-Rails: None Technical brewer of Steps: 4 Home Layout: Two level        Prior Function Level of Independence: Independent               Hand Dominance        Extremity/Trunk Assessment   Upper Extremity Assessment: Overall WFL for tasks assessed           Lower Extremity Assessment: Overall WFL for tasks assessed      Cervical / Trunk Assessment: Normal  Communication   Communication: No difficulties  Cognition Arousal/Alertness: Awake/alert Behavior During Therapy: WFL for tasks assessed/performed Overall Cognitive Status: Within Functional Limits for tasks assessed (note some memory issues)                      General Comments      Exercises        Assessment/Plan    PT Assessment Patent does not need any further PT services  PT Diagnosis Generalized weakness   PT Problem List    PT Treatment Interventions     PT Goals (Current goals can be found in the Care Plan section) Acute Rehab PT Goals PT Goal Formulation: All assessment and education complete, DC therapy    Frequency  Barriers to discharge        Co-evaluation               End of Session   Activity Tolerance: Patient tolerated treatment well Patient left: in chair;with call bell/phone within reach           Time: 1111-1139 PT Time Calculation (min) (ACUTE ONLY): 28 min   Charges:   PT Evaluation $Initial PT Evaluation Tier I: 1 Procedure PT Treatments $Gait Training: 8-22 mins $Therapeutic Activity: 8-22 mins   PT G Codes:        Denice Bors 02/28/2014, 12:01 PM

## 2014-02-28 NOTE — Telephone Encounter (Signed)
per pof to sch pt appt-cld & spoke to pt spouse [name] and gave pt time & date of appt-spouse understood

## 2014-02-28 NOTE — Progress Notes (Signed)
OT Cancellation Note  Patient Details Name: Rachel [name] MRN: [id number] DOB: Oct 07, 1950   Cancelled Treatment:    Reason Eval/Treat Not Completed: PT screened, no needs identified, will sign off.  PT called to tell me that pt completed whole ADL at mod I level  during PT's session.  No OT needs.  Will sign off.   Elcie Pelster 02/28/2014, 11:59 AM  Lesle Chris, OTR/L [phone] 02/28/2014

## 2014-02-28 NOTE — Discharge Summary (Signed)
Physician Discharge Summary  Rachel [name] PRF:[id number] DOB: 09/17/1950 DOA: 02/27/2014  PCP: Wenda Low, MD  Admit date: 02/27/2014 Discharge date: 02/28/2014  Time spent: 65 minutes  Recommendations for Outpatient Follow-up:  1. Follow-up with Dr. Jana Hakim, hematology oncology on Friday, 03/03/2014. On follow-up blood cultures which were obtained will need to be followed up upon as well as urine cultures. Patient will need a CBC with differential as well as a comprehensive metabolic profile done on follow-up.  Discharge Diagnoses:  Principal Problem:   Sepsis Active Problems:   Infusion reaction   CLL (chronic lymphocytic leukemia)   Low grade squamous intraepithelial lesion (LGSIL) on Papanicolaou smear of cervix   Anemia in neoplastic disease   Hypotension   Fever and chills   Discharge Condition: Stable and improved  Diet recommendation: Regular  Filed Weights   02/27/14 1708 02/28/14 0500  Weight: 56.1 kg (123 lb 10.9 oz) 56.8 kg (125 lb 3.5 oz)    History of present illness:  Rachel [name] is a 64 y.o. 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 Jana Hakim who was admitted as a direct admit from the [place] secondary to chills/rigors, fever and hypotension while undergoing infusion of Rituxan. Patient at that time was also noted to have some shortness of breath. Patient stated she's been having fevers over the past 1 week with temperatures going as high as 102.3 with some associated chills nausea and did have suprapubic abdominal pain 1 week prior to admission. Prior to day of admission patient denies any chest pain, no shortness of breath, no dysuria, no diarrhea, no constipation, no weakness, no melena, no hematemesis, no hematochezia. Patient endorsed some urinary hesitancy. Patient also endorsed cough with postnasal drip as well as  a headache in the posterior head. Patient stated she was seen at her PCPs office or one of his partners 8 days prior to admission urinalysis was done at that time and concern initially for UTI and patient was placed on antibiotics. Patient stated that 2 days later she was called to stop the antibiotic as urine cultures were negative. Blood cultures were drawn at the cancer center and urinalysis was also obtained which was unremarkable. [facility] were called to admit the patient for further evaluation and management.  Hospital Course:  #1 Sepsis versus infusion reaction Patient noted to have fever, chills, rigors and some shortness of breath, while receiving infusion of Rituxan. Patient also stated she had fevers over the past week and was initially felt to have a urinary tract infection and was partially treated with antibiotics but some associated suprapubic tenderness. Patient was admitted to telemetry. Patient was protecting her airway. Chest x-ray was obtained which was negative for any acute infiltrate. Blood cultures were already drawn at the cancer center and were pending at the time of discharge. Repeat urinalysis which was done was negative. Lactic acid level was slightly elevated at 2.4 and a pro calcitonin 3.87. Patient was placed on Pepcid 20 mg IV every 12 hours, Benadryl 50 mg IV daily, Solu-Medrol 125 mg IV every 12 hours. Tylenol 500 mg 3 times daily. Tylenol scheduled was subsequently discontinued. Patient remained afebrile throughout the hospitalization. Patient's white count improved and the with 28.5 on day of discharge. IV Solu-Medrol was transitioned to oral prednisone and patient be discharged on a prednisone taper. Benadryl was discontinued. And patient will also be discharged on Pepcid taper. Patient was maintained  in part on IV vancomycin and IV Levaquin throughout the hospitalization. Patient will be discharged on 4 more days of oral Levaquin to complete a five-day course  of antibiotic therapy. Patient will follow-up with her oncologist as outpatient.   #2 fever and chills Questionable etiology. Patient has been having fever over the past week. Initially one week prior to admission those concern for urinary tract infection with patient was partially treated for as urine cultures came back negative and antibiotics were discontinued. Blood cultures were obtained at the cancer center prior to admission and were pending at time of discharge. These will need to be followed up upon. Chest x-ray which was done was negative for any acute infiltrate. Lactic acid level was slightly elevated at 2.4. Pro calcitonin was 3.87. Patient remained afebrile throughout the rest of the hospitalization. Patient was placed empirically on IV vancomycin and IV Levaquin. Patient felt well by day of discharge. Patient be discharged home on 4 days of oral Levaquin to complete a five-day course of antibiotic therapy. Patient will follow-up with her oncologist as outpatient.   #3 hypotension Likely secondary to problem #1. Patient with no overt bleeding. Patient was been pancultured. Chest x-ray done was negative for any acute infiltrate. Repeat urinalysis which was done was negative. Blood cultures were drawn at the cancer center prior to admission were pending at time of discharge. Patient was hydrated with IV fluids with improvement in her blood pressure, to her baseline. Patient does run on the low side of blood pressures in the mid to high 90s. Outpatient follow-up.  #4 anemia of neoplastic disease CBC was checked patient's hemoglobin was 10.2 on admission and 8.8 on discharge. Patient did not have any overt bleeding and slight drop in patient's hemoglobin was likely a dilutional effect. Outpatient follow-up.  #5 CLL Remained stable during the hospitalization. Lab work obtained during the hospitalization CBC noted to have a white count of 28.5 hemoglobin of 8.8 and platelet count of 68. Patient  did not have any bleeding episodes during the hospitalization. Patient will follow-up with oncology as outpatient.   Procedures:  Chest x-ray 02/27/2014  Consultations:  None  Discharge Exam: Filed Vitals:   02/28/14 1536  BP: 99/42  Pulse: 53  Temp: 97.8 F (36.6 C)  Resp: 18    General: NAD Cardiovascular: RRR Respiratory: CTAB  Discharge Instructions   Discharge Instructions    Diet general    Complete by:  As directed      Discharge instructions    Complete by:  As directed   Follow up with Dr Jana Hakim on Friday 03/03/13. Office will call with appointment time.     Increase activity slowly    Complete by:  As directed           Current Discharge Medication List    START taking these medications   Details  famotidine (PEPCID) 20 MG tablet Take 1 tablet (20 mg total) by mouth daily. Take 1 tablet daily x 2 days then stop.    levofloxacin (LEVAQUIN) 500 MG tablet Take 1 tablet (500 mg total) by mouth daily. Take fore 4 days then stop. Qty: 4 tablet, Refills: 0    predniSONE (DELTASONE) 20 MG tablet Take 3 tablets (60 mg total) by mouth daily with breakfast. Take 3 tablets(60mg ) daily x 2 days then 2 tablets (40mg ) daily x 2 days, then 1 tablet (20mg ) daily x 2 days then stop. Qty: 12 tablet, Refills: 0      CONTINUE these medications  which have NOT CHANGED   Details  acetaminophen (TYLENOL) 500 MG tablet Take 500 mg by mouth every 6 (six) hours as needed for headache.    acyclovir (ZOVIRAX) 400 MG tablet Take 400 mg by mouth daily.    allopurinol (ZYLOPRIM) 300 MG tablet Take 300 mg by mouth daily.    aspirin 81 MG tablet Take 81 mg by mouth daily.    Calcium Citrate (CITRACAL PO) Take 4 tablets by mouth daily.     cholecalciferol (VITAMIN D) 1000 UNITS tablet Take 4,000 Units by mouth daily.    dexamethasone (DECADRON) 4 MG tablet Take 4 mg by mouth 2 (two) times daily. After chemo    Estradiol (ELESTRIN) 0.52 MG/0.87 GM (0.06%) GEL Apply 2  application topically See admin instructions. 2 pumps thinly to upper arm daily at bedtime    fluocinonide ointment (LIDEX) 1.61 % Apply 1 application topically 4 (four) times daily as needed (fever blisters).    HYDROcodone-acetaminophen (NORCO/VICODIN) 5-325 MG per tablet Take 1 tablet by mouth every 6 (six) hours as needed for moderate pain. Qty: 60 tablet, Refills: 0    lidocaine-prilocaine (EMLA) cream Apply 1 application topically as needed (for port). Apply nickel size amount to skin on top of port. Cover with saran  Wrap at least one hour prior to access.    LORazepam (ATIVAN) 0.5 MG tablet Take 0.5 mg by mouth at bedtime as needed for anxiety.    omeprazole (PRILOSEC) 20 MG capsule Take 20 mg by mouth daily as needed (acid reflux).     ondansetron (ZOFRAN) 4 MG tablet Take 4 mg by mouth 2 (two) times daily as needed for nausea or vomiting.    oxyCODONE-acetaminophen (PERCOCET/ROXICET) 5-325 MG per tablet Take 1-2 tablets by mouth every 4 (four) hours as needed for severe pain.    PRESCRIPTION MEDICATION Chemo [facility]    prochlorperazine (COMPAZINE) 10 MG tablet Take 10 mg by mouth every 6 (six) hours as needed for nausea or vomiting.    progesterone (PROMETRIUM) 200 MG capsule Take 200 mg by mouth See admin instructions. on days 1-12 every other month    traZODone (DESYREL) 50 MG tablet Take 50 mg by mouth at bedtime.      STOP taking these medications     ibuprofen (ADVIL,MOTRIN) 200 MG tablet      ciprofloxacin (CIPRO) 500 MG tablet        Allergies  Allergen Reactions  . Penicillins Anaphylaxis    "stopped breathing" "drops my heartbeat" "I just pass out"   Follow-up Information    Follow up with Chauncey Cruel, MD On 03/03/2014.   Specialty:  Oncology   Why:  office will call with appointment time.   Contact information:   [address] [place] [address] [phone]        The results of significant diagnostics from this hospitalization  (including imaging, microbiology, ancillary and laboratory) are listed below for reference.    Significant Diagnostic Studies: Portable Chest 1 View  02/27/2014   CLINICAL DATA:  Fever and chills and weakness today the patient had chemotherapy for CLL today.  EXAM: PORTABLE CHEST - 1 VIEW  COMPARISON:  01/14/2012  FINDINGS: Power port in place. Heart size and pulmonary vascularity are normal. Lungs are clear. No effusions. No osseous abnormality.  IMPRESSION: No acute abnormalities.   Electronically Signed   By: Rozetta Nunnery M.D.   On: 02/27/2014 17:05    Microbiology: Recent Results (from the past 240 hour(s))  TECHNOLOGIST REVIEW  Status: None   Collection Time: 02/23/14  1:44 PM  Result Value Ref Range Status   Technologist Review   Final    Variant lymphs present some with nucleoli, moderate smudge cells     Labs: Basic Metabolic Panel:  Recent Labs Lab 02/23/14 1347 02/27/14 1705 02/28/14 0525  NA 141 140 139  K 4.2 4.2 3.6  CL  --  107 115*  CO2 30* 24 22  GLUCOSE 108 219* 152*  BUN 10.5 10 10   CREATININE 1.0 0.82 0.61  CALCIUM 9.1 8.2* 7.2*  MG  --  2.0  --   PHOS  --  2.3  --    Liver Function Tests:  Recent Labs Lab 02/23/14 1347 02/27/14 1705  AST 44* 142*  ALT 46 71*  ALKPHOS 106 121*  BILITOT 0.32 0.2*  PROT 5.8* 5.5*  ALBUMIN 3.4* 3.2*   No results for input(s): LIPASE, AMYLASE in the last 168 hours. No results for input(s): AMMONIA in the last 168 hours. CBC:  Recent Labs Lab 02/23/14 1344 02/27/14 1705 02/28/14 0525  WBC 54.5* 31.3* 28.5*  NEUTROABS 2.5 3.4  --   HGB 10.7* 10.2* 8.8*  HCT 34.8 32.1* 26.5*  MCV 96.3 97.6 95.3  PLT 126* 90* 68*   Cardiac Enzymes: No results for input(s): CKTOTAL, CKMB, CKMBINDEX, TROPONINI in the last 168 hours. BNP: BNP (last 3 results) No results for input(s): PROBNP in the last 8760 hours. CBG:  Recent Labs Lab 02/28/14 0802  GLUCAP 127*       Signed:  THOMPSON,DANIEL MD [facility] 02/28/2014, 5:36 PM

## 2014-03-01 ENCOUNTER — Other Ambulatory Visit: Payer: Self-pay | Admitting: Oncology

## 2014-03-01 DIAGNOSIS — C9110 Chronic lymphocytic leukemia of B-cell type not having achieved remission: Secondary | ICD-10-CM

## 2014-03-01 LAB — URINE CULTURE: Colony Count: 5000

## 2014-03-03 ENCOUNTER — Ambulatory Visit (HOSPITAL_BASED_OUTPATIENT_CLINIC_OR_DEPARTMENT_OTHER): Payer: Medicare Other | Admitting: Oncology

## 2014-03-03 ENCOUNTER — Other Ambulatory Visit (HOSPITAL_COMMUNITY)
Admission: RE | Admit: 2014-03-03 | Discharge: 2014-03-03 | Disposition: A | Discharge status: Home / Self Care | Payer: Medicare Other | Source: Ambulatory Visit | Attending: Oncology | Admitting: Oncology

## 2014-03-03 ENCOUNTER — Other Ambulatory Visit (HOSPITAL_BASED_OUTPATIENT_CLINIC_OR_DEPARTMENT_OTHER): Payer: Medicare Other

## 2014-03-03 VITALS — BP 108/65 | HR 67 | Temp 97.9°F | Resp 18 | Ht 58.5 in | Wt 123.9 lb

## 2014-03-03 DIAGNOSIS — R509 Fever, unspecified: Secondary | ICD-10-CM

## 2014-03-03 DIAGNOSIS — C9110 Chronic lymphocytic leukemia of B-cell type not having achieved remission: Secondary | ICD-10-CM | POA: Insufficient documentation

## 2014-03-03 DIAGNOSIS — D630 Anemia in neoplastic disease: Secondary | ICD-10-CM

## 2014-03-03 DIAGNOSIS — C9192 Lymphoid leukemia, unspecified, in relapse: Secondary | ICD-10-CM

## 2014-03-03 DIAGNOSIS — C9112 Chronic lymphocytic leukemia of B-cell type in relapse: Secondary | ICD-10-CM

## 2014-03-03 DIAGNOSIS — R87612 Low grade squamous intraepithelial lesion on cytologic smear of cervix (LGSIL): Secondary | ICD-10-CM

## 2014-03-03 LAB — CBC WITH DIFFERENTIAL/PLATELET
BASO%: 0.3 % (ref 0.0–2.0)
Basophils Absolute: 0.4 10*3/uL — ABNORMAL HIGH (ref 0.0–0.1)
EOS ABS: 0.9 10*3/uL — AB (ref 0.0–0.5)
EOS%: 0.7 % (ref 0.0–7.0)
HEMATOCRIT: 34.3 % — AB (ref 34.8–46.6)
HGB: 10.3 g/dL — ABNORMAL LOW (ref 11.6–15.9)
LYMPH#: 103.7 10*3/uL — AB (ref 0.9–3.3)
LYMPH%: 90.8 % — AB (ref 14.0–49.7)
MCH: 29.4 pg (ref 25.1–34.0)
MCHC: 30 g/dL — ABNORMAL LOW (ref 31.5–36.0)
MCV: 97.8 fL (ref 79.5–101.0)
MONO#: 2.2 10*3/uL — ABNORMAL HIGH (ref 0.1–0.9)
MONO%: 1.9 % (ref 0.0–14.0)
NEUT#: 102.6 10*3/uL — ABNORMAL HIGH (ref 1.5–6.5)
NEUT%: 6.3 % — ABNORMAL LOW (ref 38.4–76.8)
Platelets: 162 10*3/uL (ref 145–400)
RBC: 3.51 10*6/uL — ABNORMAL LOW (ref 3.70–5.45)
RDW: 15.5 % — ABNORMAL HIGH (ref 11.2–14.5)
WBC: 115 10*3/uL (ref 3.9–10.3)

## 2014-03-03 LAB — COMPREHENSIVE METABOLIC PANEL (CC13)
ALK PHOS: 126 U/L (ref 40–150)
ALT: 38 U/L (ref 0–55)
ANION GAP: 9 meq/L (ref 3–11)
AST: 24 U/L (ref 5–34)
Albumin: 3.4 g/dL — ABNORMAL LOW (ref 3.5–5.0)
BUN: 17 mg/dL (ref 7.0–26.0)
CALCIUM: 8.8 mg/dL (ref 8.4–10.4)
CO2: 30 mEq/L — ABNORMAL HIGH (ref 22–29)
Chloride: 102 mEq/L (ref 98–109)
Creatinine: 1.1 mg/dL (ref 0.6–1.1)
EGFR: 56 mL/min/{1.73_m2} — AB (ref >90)
GLUCOSE: 138 mg/dL (ref 70–140)
POTASSIUM: 3.8 meq/L (ref 3.5–5.1)
Sodium: 141 mEq/L (ref 136–145)
TOTAL PROTEIN: 5.7 g/dL — AB (ref 6.4–8.3)
Total Bilirubin: 0.24 mg/dL (ref 0.20–1.20)

## 2014-03-03 LAB — TECHNOLOGIST REVIEW

## 2014-03-03 NOTE — Progress Notes (Signed)
ID: Rachel [name]   DOB: [birth year]  MR#: [id number]  TFT#:[id number]  PCP: [name] Md GYN: SU:  OTHER MD: [name] M.D.  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 She has been treated with Rituxan, ofatumumab, and cladribine, with details summarized below.  INTERVAL HISTORY: Rachel [name] returns today for follow up of her chronic lymphoid leukemia accompanied by [name].. Today is day 5, cycle 3 of 4-6 planned cycles of bendamustine/ rituximab planned. However, after starting the rituximab day 1 cycle 3 (02/27/2013) she developed chills and rigors. She had been having temperatures very intermittently for the past 10 days or so, sometimes up to 102 degrees. She was treated according to the hypersensitivity protocol and pancultured. CXR showed NED. Because of hypotension and possible sepsis she was admitted 1/4 and started on IV vanco and levaqui, but discharged the next day and she was stable on re-evaluation. She was discharged on po levaquin and a prednisone taper. -- Over the past 3 days the patient has felt better and has gone to the gym daily (doing zumba for one hour, etc). She is here today to reassess her treatment plan in light of possible reaction to rituximab  REVIEW OF SYSTEMS: Rachel [name] feels "swollen" from "all the fluids they gave me" in the hospital. Her weight increase from 118 [02/23/2014] to 125 [02/28/2014] down to 124 today. She denies SOB, cough, CP/P. Has hot flashes and night sweats as before but no fever or rigors and no drenching sweats. Her knees hurt. She has some sinus symptoms. Her appetite is poor. She has a mild headache at times (back of her neck) but no N/V, visual changes, or gait imbalance. She admits to being forgetful, anxious and depressed. A  detailed ROS today was otherwise stable   PAST MEDICAL HISTORY: Past Medical History  Diagnosis Date  . Cancer   . Leukemia   . Lower back pain     PAST SURGICAL HISTORY: Past Surgical History  Procedure Laterality Date  . Tonsillectomy    . Other surgical history      removal of uterine lining  . Lining of uterus removed    . Bunionectomy      right foot  . Breast surgery  2015    left breast bx    FAMILY HISTORY Family History  Problem Relation Age of Onset  . Depression Son     GYNECOLOGIC HISTORY:  SOCIAL HISTORY: [name] is retired from [employer]. Her son [name] lives in [place] and has 3 sons; he works for a Pharmacist, community. A second son lives in [place] and has two children. Son [name] lives in [place] and has a daughter, as well as 2 sons by marriage.   ADVANCED DIRECTIVES:  HEALTH MAINTENANCE: History  Substance Use Topics  . Smoking status: Never Smoker   . Smokeless tobacco: Never Used  . Alcohol Use: Yes     Comment: socially     Colonoscopy: due  PAP: December 2012/ [name]  Bone density: Due  Lipid panel: per Dr Drema Dallas  Allergies  Allergen Reactions  . Penicillins Anaphylaxis    "stopped breathing" "drops my heartbeat" "I just pass out"    Current Outpatient Prescriptions  Medication Sig Dispense Refill  . acetaminophen (TYLENOL) 500 MG tablet Take 500 mg by mouth every 6 (six) hours as needed  for headache.    Marland Kitchen acyclovir (ZOVIRAX) 400 MG tablet Take 400 mg by mouth daily.    Marland Kitchen allopurinol (ZYLOPRIM) 300 MG tablet Take 300 mg by mouth daily.    Marland Kitchen aspirin 81 MG tablet Take 81 mg by mouth daily.    . Calcium Citrate (CITRACAL PO) Take 4 tablets by mouth daily.     . cholecalciferol (VITAMIN D) 1000 UNITS tablet Take 4,000 Units by mouth daily.    Marland Kitchen dexamethasone (DECADRON) 4 MG tablet Take 4 mg by mouth 2 (two) times daily. After chemo    . Estradiol (ELESTRIN) 0.52 MG/0.87 GM (0.06%) GEL Apply 2 application topically  See admin instructions. 2 pumps thinly to upper arm daily at bedtime    . famotidine (PEPCID) 20 MG tablet Take 1 tablet (20 mg total) by mouth daily. Take 1 tablet daily x 2 days then stop.    . fluocinonide ointment (LIDEX) 5.78 % Apply 1 application topically 4 (four) times daily as needed (fever blisters).    Marland Kitchen HYDROcodone-acetaminophen (NORCO/VICODIN) 5-325 MG per tablet Take 1 tablet by mouth every 6 (six) hours as needed for moderate pain. (Patient taking differently: Take 1 tablet by mouth every 6 (six) hours as needed for moderate pain. headaches) 60 tablet 0  . levofloxacin (LEVAQUIN) 500 MG tablet Take 1 tablet (500 mg total) by mouth daily. Take fore 4 days then stop. 4 tablet 0  . lidocaine-prilocaine (EMLA) cream Apply a nickel size amount to skin on top of port- cover with saran wrap at least 1 hour before access 30 g 0  . LORazepam (ATIVAN) 0.5 MG tablet Take 0.5 mg by mouth at bedtime as needed for anxiety.    Marland Kitchen omeprazole (PRILOSEC) 20 MG capsule Take 20 mg by mouth daily as needed (acid reflux).     . ondansetron (ZOFRAN) 4 MG tablet Take 4 mg by mouth 2 (two) times daily as needed for nausea or vomiting.    Marland Kitchen oxyCODONE-acetaminophen (PERCOCET/ROXICET) 5-325 MG per tablet Take 1-2 tablets by mouth every 4 (four) hours as needed for severe pain.    . predniSONE (DELTASONE) 20 MG tablet Take 3 tablets (60 mg total) by mouth daily with breakfast. Take 3 tablets(60mg ) daily x 2 days then 2 tablets (40mg ) daily x 2 days, then 1 tablet (20mg ) daily x 2 days then stop. 12 tablet 0  . PRESCRIPTION MEDICATION Chemo [facility]    . prochlorperazine (COMPAZINE) 10 MG tablet Take 10 mg by mouth every 6 (six) hours as needed for nausea or vomiting.    . progesterone (PROMETRIUM) 200 MG capsule Take 200 mg by mouth See admin instructions. on days 1-12 every other month    . traZODone (DESYREL) 50 MG tablet Take 50 mg by mouth at bedtime.     No current facility-administered medications for this visit.     OBJECTIVE: Middle-aged white woman who appears stated age  64 Vitals:   03/03/14 1613  BP: 108/65  Pulse: 67  Temp: 97.9 F (36.6 C)  Resp: 18   Filed Weights   03/03/14 1613  Weight: 123 lb 14.4 oz (56.201 kg)   Body mass index is 25.45 kg/(m^2).    ECOG FS: 1  Sclerae unicteric, pupils equal and reactive, EOMs intact Oropharynx clear, teeth in good condition No cervical or supraclavicular adenopathy, no axillary or inguinal adenopathy Lungs no rales or rhonchi Heart regular rate and rhythm Abd soft, nontender, positive bowel sounds, no splenomegaly or palpable masses MSK no focal spinal  tenderness, no jknee swelling, minimal bilateral ankle swelling Neuro: nonfocal, well oriented, appropriate affect Breasts: Deferred    LAB RESULTS:  Lab Results  Component Value Date   WBC 115.0* 03/03/2014   NEUTROABS 102.6* 03/03/2014   HGB 10.3* 03/03/2014   HCT 34.3* 03/03/2014   MCV 97.8 03/03/2014   PLT 162 03/03/2014      Chemistry      Component Value Date/Time   NA 141 03/03/2014 1556   NA 139 02/28/2014 0525   K 3.8 03/03/2014 1556   K 3.6 02/28/2014 0525   CL 115* 02/28/2014 0525   CL 101 12/15/2011 1322   CO2 30* 03/03/2014 1556   CO2 22 02/28/2014 0525   BUN 17.0 03/03/2014 1556   BUN 10 02/28/2014 0525   CREATININE 1.1 03/03/2014 1556   CREATININE 0.61 02/28/2014 0525      Component Value Date/Time   CALCIUM 8.8 03/03/2014 1556   CALCIUM 7.2* 02/28/2014 0525   ALKPHOS 126 03/03/2014 1556   ALKPHOS 121* 02/27/2014 1705   AST 24 03/03/2014 1556   AST 142* 02/27/2014 1705   ALT 38 03/03/2014 1556   ALT 71* 02/27/2014 1705   BILITOT 0.24 03/03/2014 1556   BILITOT 0.2* 02/27/2014 1705     Results for AMATULLAH, CHRISTY (MRN [id number]) as of 03/04/2014 08:31  Ref. Range 01/24/2014 09:05 02/01/2014 13:50 02/09/2014 14:19 02/23/2014 13:44 03/03/2014 15:56  lymph# Latest Range: 0.9-3.3 10e3/uL 67.0 (H) 56.3 (H) 55.4 (H) 50.6 (H) 103.7 (H)   BLOOD  FILM: confirms lymphocytosis; the lymphocytes do not appear transformed  STUDIES: Portable Chest 1 View  02/27/2014   CLINICAL DATA:  Fever and chills and weakness today the patient had chemotherapy for CLL today.  EXAM: PORTABLE CHEST - 1 VIEW  COMPARISON:  01/14/2012  FINDINGS: Power port in place. Heart size and pulmonary vascularity are normal. Lungs are clear. No effusions. No osseous abnormality.  IMPRESSION: No acute abnormalities.   Electronically Signed   By: Rozetta Nunnery M.D.   On: 02/27/2014 17:05     ASSESSMENT: 64 y.o.   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 rigors 02/27/2014: received IV vancomycin and levaquin briefly, then levaquin po (pcn allergy)  PLAN: Jo's WBC dropped from [place] 2015 to 28.5K JAN 2016 after two doses of bendamustine/ rituximab. The lymphocyte count has rebounded quickly and there is a question whether she had a reaction to the rituximab. I think it more likely she had an episode of rigors, though her blood cultures so far are negative. I do not believe we are dealing with a Darron Doom transformation based on a review of her blood film. I am sending a sample for flow cytometry to make sure there has been no phenotypic change.  We could change treatment (fludara/ cytoxan/ rituxan?  Ibrutinib? campath/decadron?) but I would like to give the  bendamustine/rituxan another try. If she tolerates 4-6 cycles we could try to maintain response with rituximab  Q 2 months or ibrutinib. Otherwise we will restage (PET, BMBX, cytogenetics) and consider fludarabased therapy.  Rachel [name] has a good understanding of the overall plan. She agrees with it. She knows the goal of treatment in her case is control. She will call with any problems that may develop before her next visit.  Chauncey Cruel, [place] [phone]  03/03/2014

## 2014-03-05 LAB — CULTURE, BLOOD (SINGLE)

## 2014-03-06 ENCOUNTER — Ambulatory Visit (HOSPITAL_BASED_OUTPATIENT_CLINIC_OR_DEPARTMENT_OTHER): Payer: Medicare Other

## 2014-03-06 ENCOUNTER — Other Ambulatory Visit: Payer: Self-pay | Admitting: Emergency Medicine

## 2014-03-06 ENCOUNTER — Telehealth: Payer: Self-pay | Admitting: Oncology

## 2014-03-06 VITALS — BP 123/80 | HR 78 | Temp 98.0°F

## 2014-03-06 DIAGNOSIS — C9192 Lymphoid leukemia, unspecified, in relapse: Secondary | ICD-10-CM

## 2014-03-06 DIAGNOSIS — R87612 Low grade squamous intraepithelial lesion on cytologic smear of cervix (LGSIL): Secondary | ICD-10-CM | POA: Diagnosis not present

## 2014-03-06 DIAGNOSIS — C9110 Chronic lymphocytic leukemia of B-cell type not having achieved remission: Secondary | ICD-10-CM

## 2014-03-06 DIAGNOSIS — R509 Fever, unspecified: Secondary | ICD-10-CM

## 2014-03-06 DIAGNOSIS — C9112 Chronic lymphocytic leukemia of B-cell type in relapse: Secondary | ICD-10-CM

## 2014-03-06 DIAGNOSIS — Z95828 Presence of other vascular implants and grafts: Secondary | ICD-10-CM

## 2014-03-06 DIAGNOSIS — Z452 Encounter for adjustment and management of vascular access device: Secondary | ICD-10-CM | POA: Diagnosis not present

## 2014-03-06 LAB — MANUAL DIFFERENTIAL
ALC: 191.9 10*3/uL — ABNORMAL HIGH (ref 0.9–3.3)
ANC (CHCC manual diff): 10.1 10*3/uL — ABNORMAL HIGH (ref 1.5–6.5)
BAND NEUTROPHILS: 0 % (ref 0–10)
BASOPHIL: 0 % (ref 0–2)
Blasts: 0 % (ref 0–0)
EOS: 0 % (ref 0–7)
LYMPH: 95 % — ABNORMAL HIGH (ref 14–49)
METAMYELOCYTES PCT: 0 % (ref 0–0)
MONO: 0 % (ref 0–14)
MYELOCYTES: 0 % (ref 0–0)
OTHER CELL: 0 % (ref 0–0)
PLT EST: ADEQUATE
PROMYELO: 0 % (ref 0–0)
SEG: 5 % — AB (ref 38–77)
Variant Lymph: 0 % (ref 0–0)
nRBC: 0 % (ref 0–0)

## 2014-03-06 LAB — CBC WITH DIFFERENTIAL/PLATELET
HCT: 34.6 % — ABNORMAL LOW (ref 34.8–46.6)
HEMOGLOBIN: 10.9 g/dL — AB (ref 11.6–15.9)
MCH: 30 pg (ref 25.1–34.0)
MCHC: 31.5 g/dL (ref 31.5–36.0)
MCV: 95.2 fL (ref 79.5–101.0)
Platelets: 200 10*3/uL (ref 145–400)
RBC: 3.63 10*6/uL — ABNORMAL LOW (ref 3.70–5.45)
RDW: 16 % — ABNORMAL HIGH (ref 11.2–14.5)
WBC: 202 10*3/uL (ref 3.9–10.3)

## 2014-03-06 LAB — LACTATE DEHYDROGENASE (CC13): LDH: 318 U/L — ABNORMAL HIGH (ref 125–245)

## 2014-03-06 MED ORDER — SODIUM CHLORIDE 0.9 % IJ SOLN
10.0000 mL | INTRAMUSCULAR | Status: DC | PRN
  Administered 2014-03-06: 10 mL via INTRAVENOUS
  Filled 2014-03-06: qty 10

## 2014-03-06 MED ORDER — HEPARIN SOD (PORK) LOCK FLUSH 100 UNIT/ML IV SOLN
500.0000 [IU] | Freq: Once | INTRAVENOUS | Status: AC
  Administered 2014-03-06: 500 [IU] via INTRAVENOUS
  Filled 2014-03-06: qty 5

## 2014-03-06 NOTE — Telephone Encounter (Signed)
, °

## 2014-03-06 NOTE — Addendum Note (Signed)
Addended by: Jaci Carrel A on: 03/06/2014 10:33 AM   Modules accepted: Medications

## 2014-03-07 ENCOUNTER — Telehealth: Payer: Self-pay | Admitting: Oncology

## 2014-03-07 LAB — HEPATITIS B CORE ANTIBODY, IGM: HEP B C IGM: NONREACTIVE

## 2014-03-07 LAB — HEPATITIS C ANTIBODY: HCV AB: NEGATIVE

## 2014-03-07 LAB — HEPATITIS B SURFACE ANTIGEN: Hepatitis B Surface Ag: NEGATIVE

## 2014-03-07 LAB — FLOW CYTOMETRY

## 2014-03-07 NOTE — Telephone Encounter (Signed)
lmonvm for pt re appts for 1/14 and 1/15. dates per staff message from desk nurse (tx). pt to get new schedule when she comes in 1/14.

## 2014-03-08 ENCOUNTER — Other Ambulatory Visit: Payer: Medicare Other

## 2014-03-08 LAB — BETA 2 MICROGLOBULIN, SERUM: Beta-2 Microglobulin: 3.58 mg/L — ABNORMAL HIGH (ref <=2.51)

## 2014-03-09 ENCOUNTER — Other Ambulatory Visit: Payer: Medicare Other

## 2014-03-09 ENCOUNTER — Other Ambulatory Visit: Payer: Self-pay | Admitting: Oncology

## 2014-03-09 ENCOUNTER — Ambulatory Visit (HOSPITAL_BASED_OUTPATIENT_CLINIC_OR_DEPARTMENT_OTHER): Payer: Medicare Other

## 2014-03-09 DIAGNOSIS — Z5111 Encounter for antineoplastic chemotherapy: Secondary | ICD-10-CM | POA: Diagnosis not present

## 2014-03-09 DIAGNOSIS — C9110 Chronic lymphocytic leukemia of B-cell type not having achieved remission: Secondary | ICD-10-CM | POA: Diagnosis not present

## 2014-03-09 DIAGNOSIS — Z5112 Encounter for antineoplastic immunotherapy: Secondary | ICD-10-CM

## 2014-03-09 MED ORDER — DEXAMETHASONE SODIUM PHOSPHATE 10 MG/ML IJ SOLN
10.0000 mg | Freq: Once | INTRAMUSCULAR | Status: AC
  Administered 2014-03-09: 10 mg via INTRAVENOUS

## 2014-03-09 MED ORDER — ONDANSETRON 8 MG/50ML IVPB (CHCC)
8.0000 mg | Freq: Once | INTRAVENOUS | Status: AC
  Administered 2014-03-09: 8 mg via INTRAVENOUS

## 2014-03-09 MED ORDER — SODIUM CHLORIDE 0.9 % IV SOLN
90.0000 mg/m2 | Freq: Once | INTRAVENOUS | Status: AC
  Administered 2014-03-09: 135 mg via INTRAVENOUS
  Filled 2014-03-09: qty 1.5

## 2014-03-09 MED ORDER — ACETAMINOPHEN 325 MG PO TABS
ORAL_TABLET | ORAL | Status: AC
Start: 2014-03-09 — End: 2014-03-09
  Filled 2014-03-09: qty 2

## 2014-03-09 MED ORDER — ONDANSETRON 8 MG/NS 50 ML IVPB
INTRAVENOUS | Status: AC
  Filled 2014-03-09: qty 8

## 2014-03-09 MED ORDER — ACETAMINOPHEN 325 MG PO TABS
650.0000 mg | ORAL_TABLET | Freq: Once | ORAL | Status: AC
  Administered 2014-03-09: 650 mg via ORAL

## 2014-03-09 MED ORDER — DIPHENHYDRAMINE HCL 50 MG/ML IJ SOLN
INTRAMUSCULAR | Status: AC
  Filled 2014-03-09: qty 1

## 2014-03-09 MED ORDER — DIPHENHYDRAMINE HCL 25 MG PO CAPS
ORAL_CAPSULE | ORAL | Status: AC
  Filled 2014-03-09: qty 1

## 2014-03-09 MED ORDER — SODIUM CHLORIDE 0.9 % IV SOLN
Freq: Once | INTRAVENOUS | Status: AC
  Administered 2014-03-09: 10:00:00 via INTRAVENOUS

## 2014-03-09 MED ORDER — DEXAMETHASONE SODIUM PHOSPHATE 10 MG/ML IJ SOLN
INTRAMUSCULAR | Status: AC
  Filled 2014-03-09: qty 1

## 2014-03-09 MED ORDER — SODIUM CHLORIDE 0.9 % IJ SOLN
10.0000 mL | INTRAMUSCULAR | Status: DC | PRN
  Administered 2014-03-09: 10 mL
  Filled 2014-03-09: qty 10

## 2014-03-09 MED ORDER — DIPHENHYDRAMINE HCL 25 MG PO CAPS
25.0000 mg | ORAL_CAPSULE | Freq: Once | ORAL | Status: AC
  Administered 2014-03-09: 25 mg via ORAL

## 2014-03-09 MED ORDER — HEPARIN SOD (PORK) LOCK FLUSH 100 UNIT/ML IV SOLN
500.0000 [IU] | Freq: Once | INTRAVENOUS | Status: AC | PRN
  Administered 2014-03-09: 500 [IU]
  Filled 2014-03-09: qty 5

## 2014-03-09 MED ORDER — SODIUM CHLORIDE 0.9 % IV SOLN
100.0000 mg | Freq: Once | INTRAVENOUS | Status: AC
  Administered 2014-03-09: 100 mg via INTRAVENOUS
  Filled 2014-03-09: qty 10

## 2014-03-09 NOTE — Patient Instructions (Signed)
Rachel [name] Discharge Instructions for Patients Receiving Chemotherapy  Today you received the following chemotherapy agents; Rituxan and Treanda.   To help prevent nausea and vomiting after your treatment, we encourage you to take your nausea medication as directed.    If you develop nausea and vomiting that is not controlled by your nausea medication, call the clinic.   BELOW ARE SYMPTOMS THAT SHOULD BE REPORTED IMMEDIATELY:  *FEVER GREATER THAN 100.5 F  *CHILLS WITH OR WITHOUT FEVER  NAUSEA AND VOMITING THAT IS NOT CONTROLLED WITH YOUR NAUSEA MEDICATION  *UNUSUAL SHORTNESS OF BREATH  *UNUSUAL BRUISING OR BLEEDING  TENDERNESS IN MOUTH AND THROAT WITH OR WITHOUT PRESENCE OF ULCERS  *URINARY PROBLEMS  *BOWEL PROBLEMS  UNUSUAL RASH Items with * indicate a potential emergency and should be followed up as soon as possible.  Feel free to call the clinic you have any questions or concerns. The clinic phone number is [phone].

## 2014-03-10 ENCOUNTER — Telehealth: Payer: Self-pay | Admitting: *Deleted

## 2014-03-10 ENCOUNTER — Telehealth: Payer: Self-pay | Admitting: Oncology

## 2014-03-10 ENCOUNTER — Other Ambulatory Visit: Payer: Self-pay | Admitting: *Deleted

## 2014-03-10 ENCOUNTER — Ambulatory Visit (HOSPITAL_BASED_OUTPATIENT_CLINIC_OR_DEPARTMENT_OTHER): Payer: Medicare Other

## 2014-03-10 DIAGNOSIS — Z5112 Encounter for antineoplastic immunotherapy: Secondary | ICD-10-CM

## 2014-03-10 DIAGNOSIS — Z5111 Encounter for antineoplastic chemotherapy: Secondary | ICD-10-CM

## 2014-03-10 DIAGNOSIS — C9110 Chronic lymphocytic leukemia of B-cell type not having achieved remission: Secondary | ICD-10-CM

## 2014-03-10 MED ORDER — ACETAMINOPHEN 325 MG PO TABS
650.0000 mg | ORAL_TABLET | Freq: Once | ORAL | Status: AC
  Administered 2014-03-10: 650 mg via ORAL

## 2014-03-10 MED ORDER — DIPHENHYDRAMINE HCL 25 MG PO CAPS
ORAL_CAPSULE | ORAL | Status: AC
  Filled 2014-03-10: qty 1

## 2014-03-10 MED ORDER — DEXAMETHASONE SODIUM PHOSPHATE 10 MG/ML IJ SOLN
10.0000 mg | Freq: Once | INTRAMUSCULAR | Status: AC
  Administered 2014-03-10: 10 mg via INTRAVENOUS

## 2014-03-10 MED ORDER — ONDANSETRON 8 MG/NS 50 ML IVPB
INTRAVENOUS | Status: AC
  Filled 2014-03-10: qty 8

## 2014-03-10 MED ORDER — ONDANSETRON 8 MG/50ML IVPB (CHCC)
8.0000 mg | Freq: Once | INTRAVENOUS | Status: AC
  Administered 2014-03-10: 8 mg via INTRAVENOUS

## 2014-03-10 MED ORDER — DEXAMETHASONE SODIUM PHOSPHATE 10 MG/ML IJ SOLN
INTRAMUSCULAR | Status: AC
  Filled 2014-03-10: qty 1

## 2014-03-10 MED ORDER — HEPARIN SOD (PORK) LOCK FLUSH 100 UNIT/ML IV SOLN
500.0000 [IU] | Freq: Once | INTRAVENOUS | Status: AC | PRN
  Administered 2014-03-10: 500 [IU]
  Filled 2014-03-10: qty 5

## 2014-03-10 MED ORDER — SODIUM CHLORIDE 0.9 % IV SOLN
500.0000 mg | Freq: Once | INTRAVENOUS | Status: AC
  Administered 2014-03-10: 500 mg via INTRAVENOUS
  Filled 2014-03-10: qty 50

## 2014-03-10 MED ORDER — SODIUM CHLORIDE 0.9 % IV SOLN
Freq: Once | INTRAVENOUS | Status: AC
  Administered 2014-03-10: 08:00:00 via INTRAVENOUS

## 2014-03-10 MED ORDER — DIPHENHYDRAMINE HCL 25 MG PO CAPS
25.0000 mg | ORAL_CAPSULE | Freq: Once | ORAL | Status: AC
  Administered 2014-03-10: 25 mg via ORAL

## 2014-03-10 MED ORDER — SODIUM CHLORIDE 0.9 % IV SOLN
90.0000 mg/m2 | Freq: Once | INTRAVENOUS | Status: AC
  Administered 2014-03-10: 135 mg via INTRAVENOUS
  Filled 2014-03-10: qty 1.5

## 2014-03-10 MED ORDER — ACETAMINOPHEN 325 MG PO TABS
ORAL_TABLET | ORAL | Status: AC
  Filled 2014-03-10: qty 2

## 2014-03-10 MED ORDER — SODIUM CHLORIDE 0.9 % IJ SOLN
10.0000 mL | INTRAMUSCULAR | Status: DC | PRN
  Administered 2014-03-10: 10 mL
  Filled 2014-03-10: qty 10

## 2014-03-10 NOTE — Patient Instructions (Signed)
[facility] Discharge Instructions for Patients Receiving Chemotherapy  Today you received the following chemotherapy agents Rituxan/Treanda.   To help prevent nausea and vomiting after your treatment, we encourage you to take your nausea medication as directed.    If you develop nausea and vomiting that is not controlled by your nausea medication, call the clinic.   BELOW ARE SYMPTOMS THAT SHOULD BE REPORTED IMMEDIATELY:  *FEVER GREATER THAN 100.5 F  *CHILLS WITH OR WITHOUT FEVER  NAUSEA AND VOMITING THAT IS NOT CONTROLLED WITH YOUR NAUSEA MEDICATION  *UNUSUAL SHORTNESS OF BREATH  *UNUSUAL BRUISING OR BLEEDING  TENDERNESS IN MOUTH AND THROAT WITH OR WITHOUT PRESENCE OF ULCERS  *URINARY PROBLEMS  *BOWEL PROBLEMS  UNUSUAL RASH Items with * indicate a potential emergency and should be followed up as soon as possible.  Feel free to call the clinic you have any questions or concerns. The clinic phone number is [phone].

## 2014-03-10 NOTE — Telephone Encounter (Signed)
, °

## 2014-03-10 NOTE — Telephone Encounter (Signed)
This RN per discussion with pt canceled appointments for 03/14/2014 for lab and visit with HF.  Pt states she is doing very well and above appointment " is too soon after my treatment yesterday and today to see any results ".  Pt is scheduled for lab, MD and treatment week of 04/03/2014.  This note will be given to MD for any other appointment changes.

## 2014-03-14 ENCOUNTER — Other Ambulatory Visit: Payer: Medicare Other

## 2014-03-14 ENCOUNTER — Ambulatory Visit: Payer: Medicare Other | Admitting: Nurse Practitioner

## 2014-03-15 ENCOUNTER — Telehealth: Payer: Self-pay | Admitting: *Deleted

## 2014-03-15 NOTE — Telephone Encounter (Signed)
Appointment request sent to scheduling.

## 2014-03-16 ENCOUNTER — Telehealth: Payer: Self-pay | Admitting: Oncology

## 2014-03-16 NOTE — Telephone Encounter (Signed)
s.w. pt and advised on Jan appt.....pt ok and aware °

## 2014-03-24 ENCOUNTER — Other Ambulatory Visit (HOSPITAL_BASED_OUTPATIENT_CLINIC_OR_DEPARTMENT_OTHER): Payer: Medicare Other

## 2014-03-24 ENCOUNTER — Ambulatory Visit (HOSPITAL_BASED_OUTPATIENT_CLINIC_OR_DEPARTMENT_OTHER): Payer: Medicare Other

## 2014-03-24 ENCOUNTER — Ambulatory Visit (HOSPITAL_BASED_OUTPATIENT_CLINIC_OR_DEPARTMENT_OTHER): Payer: Medicare Other | Admitting: Nurse Practitioner

## 2014-03-24 ENCOUNTER — Telehealth: Payer: Self-pay | Admitting: Nurse Practitioner

## 2014-03-24 ENCOUNTER — Encounter: Payer: Self-pay | Admitting: Nurse Practitioner

## 2014-03-24 VITALS — BP 114/52 | HR 73 | Temp 98.6°F | Resp 18 | Ht 58.5 in | Wt 115.0 lb

## 2014-03-24 DIAGNOSIS — Z452 Encounter for adjustment and management of vascular access device: Secondary | ICD-10-CM

## 2014-03-24 DIAGNOSIS — C9192 Lymphoid leukemia, unspecified, in relapse: Secondary | ICD-10-CM

## 2014-03-24 DIAGNOSIS — C9110 Chronic lymphocytic leukemia of B-cell type not having achieved remission: Secondary | ICD-10-CM

## 2014-03-24 DIAGNOSIS — R87612 Low grade squamous intraepithelial lesion on cytologic smear of cervix (LGSIL): Secondary | ICD-10-CM

## 2014-03-24 DIAGNOSIS — R509 Fever, unspecified: Secondary | ICD-10-CM

## 2014-03-24 DIAGNOSIS — D649 Anemia, unspecified: Secondary | ICD-10-CM | POA: Diagnosis not present

## 2014-03-24 DIAGNOSIS — D630 Anemia in neoplastic disease: Secondary | ICD-10-CM

## 2014-03-24 DIAGNOSIS — Z95828 Presence of other vascular implants and grafts: Secondary | ICD-10-CM

## 2014-03-24 DIAGNOSIS — C9112 Chronic lymphocytic leukemia of B-cell type in relapse: Secondary | ICD-10-CM

## 2014-03-24 LAB — MANUAL DIFFERENTIAL
ALC: 17.7 10e3/uL — ABNORMAL HIGH (ref 0.9–3.3)
ANC (CHCC manual diff): 3.5 10e3/uL (ref 1.5–6.5)
Band Neutrophils: 0 % (ref 0–10)
Basophil: 0 % (ref 0–2)
Blasts: 0 % (ref 0–0)
EOS: 3 % (ref 0–7)
LYMPH: 75 % — ABNORMAL HIGH (ref 14–49)
MONO: 7 % (ref 0–14)
Metamyelocytes: 0 % (ref 0–0)
Myelocytes: 0 % (ref 0–0)
Other Cell: 0 % (ref 0–0)
PLT EST: DECREASED
PROMYELO: 0 % (ref 0–0)
RBC Comments: NORMAL
SEG: 15 % — ABNORMAL LOW (ref 38–77)
Variant Lymph: 0 % (ref 0–0)
nRBC: 0 % (ref 0–0)

## 2014-03-24 LAB — COMPREHENSIVE METABOLIC PANEL (CC13)
ALK PHOS: 97 U/L (ref 40–150)
ALT: 28 U/L (ref 0–55)
ANION GAP: 9 meq/L (ref 3–11)
AST: 27 U/L (ref 5–34)
Albumin: 3.5 g/dL (ref 3.5–5.0)
BILIRUBIN TOTAL: 0.38 mg/dL (ref 0.20–1.20)
BUN: 9.6 mg/dL (ref 7.0–26.0)
CO2: 25 mEq/L (ref 22–29)
Calcium: 8.9 mg/dL (ref 8.4–10.4)
Chloride: 108 mEq/L (ref 98–109)
Creatinine: 0.8 mg/dL (ref 0.6–1.1)
EGFR: 80 mL/min/{1.73_m2} — ABNORMAL LOW (ref >90)
GLUCOSE: 102 mg/dL (ref 70–140)
Potassium: 4 mEq/L (ref 3.5–5.1)
Sodium: 142 mEq/L (ref 136–145)
Total Protein: 5.9 g/dL — ABNORMAL LOW (ref 6.4–8.3)

## 2014-03-24 LAB — CBC WITH DIFFERENTIAL/PLATELET
BASO%: 0.6 % (ref 0.0–2.0)
Basophils Absolute: 0.2 10*3/uL — ABNORMAL HIGH (ref 0.0–0.1)
EOS ABS: 0.7 10*3/uL — AB (ref 0.0–0.5)
EOS%: 2.4 % (ref 0.0–7.0)
HCT: 33.2 % — ABNORMAL LOW (ref 34.8–46.6)
HEMATOCRIT: 36.7 % (ref 34.8–46.6)
HGB: 11.9 g/dL (ref 11.6–15.9)
HGB: 10.9 g/dL — ABNORMAL LOW (ref 11.6–15.9)
LYMPH%: 82.4 % — AB (ref 14.0–49.7)
MCH: 30.7 pg (ref 25.1–34.0)
MCH: 31 pg (ref 25.1–34.0)
MCHC: 32.4 g/dL (ref 31.5–36.0)
MCHC: 32.8 g/dL (ref 31.5–36.0)
MCV: 94.6 fL (ref 79.5–101.0)
MCV: 94.3 fL (ref 79.5–101.0)
MONO#: 2.9 10*3/uL — ABNORMAL HIGH (ref 0.1–0.9)
MONO%: 10 % (ref 0.0–14.0)
NEUT%: 4.6 % — ABNORMAL LOW (ref 38.4–76.8)
NEUTROS ABS: 1.3 10*3/uL — AB (ref 1.5–6.5)
PLATELETS: 124 10*3/uL — AB (ref 145–400)
Platelets: 134 10e3/uL — ABNORMAL LOW (ref 145–400)
RBC: 3.88 10*6/uL (ref 3.70–5.45)
RBC: 3.52 10e6/uL — ABNORMAL LOW (ref 3.70–5.45)
RDW: 14.8 % — ABNORMAL HIGH (ref 11.2–14.5)
RDW: 14.8 % — ABNORMAL HIGH (ref 11.2–14.5)
WBC: 29.1 10*3/uL — ABNORMAL HIGH (ref 3.9–10.3)
WBC: 23.6 10e3/uL — ABNORMAL HIGH (ref 3.9–10.3)
lymph#: 23.9 10*3/uL — ABNORMAL HIGH (ref 0.9–3.3)

## 2014-03-24 LAB — LACTATE DEHYDROGENASE (CC13): LDH: 225 U/L (ref 125–245)

## 2014-03-24 LAB — TECHNOLOGIST REVIEW

## 2014-03-24 MED ORDER — SODIUM CHLORIDE 0.9 % IJ SOLN
10.0000 mL | INTRAMUSCULAR | Status: DC | PRN
  Administered 2014-03-24: 10 mL via INTRAVENOUS
  Filled 2014-03-24: qty 10

## 2014-03-24 MED ORDER — HEPARIN SOD (PORK) LOCK FLUSH 100 UNIT/ML IV SOLN
500.0000 [IU] | Freq: Once | INTRAVENOUS | Status: AC
  Administered 2014-03-24: 500 [IU] via INTRAVENOUS
  Filled 2014-03-24: qty 5

## 2014-03-24 NOTE — Telephone Encounter (Signed)
, °

## 2014-03-24 NOTE — Progress Notes (Signed)
ID: [name]   DOB: May 24, 1950  MR#: [id number]  DQQ#:[id number]  PCP: [name] Md GYN: SU:  OTHER MD: [name] M.D.  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 She has been treated with Rituxan, ofatumumab, and cladribine, with details summarized below.  INTERVAL HISTORY: Rachel [name] returns today for follow up of her chronic lymphoid leukemia accompanied by [name]. Today is day 15, cycle 4 of 4-6 planned cycles of bendamustine/ rituximab planned.   REVIEW OF SYSTEMS: Rachel [name] feels better today. She feels like she has more energy than she should. She continues Zumba daily and can now complete the turns. Previously, they made her dizzy. She has lost the water weight she gained while hospitalized. She is down to 115lb today. She denies fevers, chills, nausea, or vomiting. She had constipation for 2 days after treatment, but it resolved on its own. Her appetite is decreased but she is supplementing with protein smoothies. She has rare mild headaches that resolve on their own. She is not sleeping well but this is not new for her. She has blurred vision firs thing in the morning secondary to dry eyes. She denies drenching night sweats, rigors, or adenopathy. She endorses anxiety and depression. A detailed review of systems is otherwise negative.  PAST MEDICAL HISTORY: Past Medical History  Diagnosis Date  . Cancer   . Leukemia   . Lower back pain     PAST SURGICAL HISTORY: Past Surgical History  Procedure Laterality Date  . Tonsillectomy    . Other surgical history      removal of uterine lining  . Lining of uterus removed    . Bunionectomy      right foot  . Breast surgery  2015    left breast bx    FAMILY HISTORY Family History  Problem Relation Age of Onset   . Depression Son     GYNECOLOGIC HISTORY:  SOCIAL HISTORY: [name] is retired from [employer]. Her son [name] lives in [place] and has 3 sons; he works for a Pharmacist, community. A second son lives in [place] and has two children. Son [name] lives in [place] and has a daughter, as well as 2 sons by marriage.   ADVANCED DIRECTIVES:  HEALTH MAINTENANCE: History  Substance Use Topics  . Smoking status: Never Smoker   . Smokeless tobacco: Never Used  . Alcohol Use: Yes     Comment: socially     Colonoscopy: due  PAP: December 2012/ [name]  Bone density: Due  Lipid panel: per Dr Drema Dallas  Allergies  Allergen Reactions  . Penicillins Anaphylaxis    "stopped breathing" "drops my heartbeat" "I just pass out"    Current Outpatient Prescriptions  Medication Sig Dispense Refill  . acyclovir (ZOVIRAX) 400 MG tablet Take 400 mg by mouth daily.    Marland Kitchen allopurinol (ZYLOPRIM) 300 MG tablet Take 300 mg by mouth daily.    Marland Kitchen aspirin 81 MG tablet Take 81 mg by mouth daily.    . Calcium Citrate (CITRACAL PO) Take 4 tablets by mouth daily.     . cholecalciferol (VITAMIN D) 1000 UNITS tablet Take 4,000 Units by mouth daily.    Marland Kitchen dexamethasone (DECADRON) 4 MG tablet Take 4 mg by mouth 2 (two) times daily. After chemo    . Estradiol (ELESTRIN) 0.52 MG/0.87 GM (0.06%) GEL Apply  2 application topically See admin instructions. 2 pumps thinly to upper arm daily at bedtime    . lidocaine-prilocaine (EMLA) cream Apply a nickel size amount to skin on top of port- cover with saran wrap at least 1 hour before access 30 g 0  . omeprazole (PRILOSEC) 20 MG capsule Take 20 mg by mouth daily as needed (acid reflux).     . traZODone (DESYREL) 50 MG tablet Take 50 mg by mouth at bedtime.    Marland Kitchen acetaminophen (TYLENOL) 500 MG tablet Take 500 mg by mouth every 6 (six) hours as needed for headache.    . famotidine (PEPCID) 20 MG tablet Take 1 tablet (20 mg total) by mouth daily. Take 1 tablet daily x 2  days then stop. (Patient not taking: Reported on 03/24/2014)    . fluocinonide ointment (LIDEX) 0.17 % Apply 1 application topically 4 (four) times daily as needed (fever blisters).    Marland Kitchen HYDROcodone-acetaminophen (NORCO/VICODIN) 5-325 MG per tablet Take 1 tablet by mouth every 6 (six) hours as needed for moderate pain. (Patient not taking: Reported on 03/24/2014) 60 tablet 0  . LORazepam (ATIVAN) 0.5 MG tablet Take 0.5 mg by mouth at bedtime as needed for anxiety.    . ondansetron (ZOFRAN) 4 MG tablet Take 4 mg by mouth 2 (two) times daily as needed for nausea or vomiting.    Marland Kitchen oxyCODONE-acetaminophen (PERCOCET/ROXICET) 5-325 MG per tablet Take 1-2 tablets by mouth every 4 (four) hours as needed for severe pain.    Marland Kitchen PRESCRIPTION MEDICATION Chemo [facility]    . prochlorperazine (COMPAZINE) 10 MG tablet Take 10 mg by mouth every 6 (six) hours as needed for nausea or vomiting.    . progesterone (PROMETRIUM) 200 MG capsule Take 200 mg by mouth See admin instructions. on days 1-12 every other month     No current facility-administered medications for this visit.    OBJECTIVE: Middle-aged white woman who appears stated age  64 Vitals:   03/24/14 0908  BP: 114/52  Pulse: 73  Temp: 98.6 F (37 C)  Resp: 18   Filed Weights   03/24/14 0908  Weight: 115 lb (52.164 kg)   Body mass index is 23.62 kg/(m^2).    ECOG FS: 1  Skin: warm, dry  HEENT: sclerae anicteric, conjunctivae pink, oropharynx clear. No thrush or mucositis.  Lymph Nodes: No cervical or supraclavicular lymphadenopathy  Lungs: clear to auscultation bilaterally, no rales, wheezes, or rhonci  Heart: regular rate and rhythm  Abdomen: round, soft, non tender, positive bowel sounds  Musculoskeletal: No focal spinal tenderness, no peripheral edema  Neuro: non focal, well oriented, positive affect  Breasts: deferred, bilateral axillae benign.  LAB RESULTS:  Lab Results  Component Value Date   WBC 29.1* 03/24/2014   NEUTROABS 1.3*  03/24/2014   HGB 11.9 03/24/2014   HCT 36.7 03/24/2014   MCV 94.6 03/24/2014   PLT 124* 03/24/2014      Chemistry      Component Value Date/Time   NA 142 03/24/2014 0838   NA 139 02/28/2014 0525   K 4.0 03/24/2014 0838   K 3.6 02/28/2014 0525   CL 115* 02/28/2014 0525   CL 101 12/15/2011 1322   CO2 25 03/24/2014 0838   CO2 22 02/28/2014 0525   BUN 9.6 03/24/2014 0838   BUN 10 02/28/2014 0525   CREATININE 0.8 03/24/2014 0838   CREATININE 0.61 02/28/2014 0525      Component Value Date/Time   CALCIUM 8.9 03/24/2014 0838   CALCIUM  7.2* 02/28/2014 0525   ALKPHOS 97 03/24/2014 0838   ALKPHOS 121* 02/27/2014 1705   AST 27 03/24/2014 0838   AST 142* 02/27/2014 1705   ALT 28 03/24/2014 0838   ALT 71* 02/27/2014 1705   BILITOT 0.38 03/24/2014 0838   BILITOT 0.2* 02/27/2014 1705     Results for DANALEE, FLATH (MRN [id number]) as of 03/24/2014 10:01  Ref. Range 02/01/2014 13:50 02/09/2014 14:19 02/23/2014 13:44 03/03/2014 15:56 03/24/2014 08:38  lymph# Latest Range: 0.9-3.3 10e3/uL 56.3 (H) 55.4 (H) 50.6 (H) 103.7 (H) 23.9 (H)    BLOOD FILM: confirms lymphocytosis; the lymphocytes do not appear transformed  STUDIES: Portable Chest 1 View  02/27/2014   CLINICAL DATA:  Fever and chills and weakness today the patient had chemotherapy for CLL today.  EXAM: PORTABLE CHEST - 1 VIEW  COMPARISON:  01/14/2012  FINDINGS: Power port in place. Heart size and pulmonary vascularity are normal. Lungs are clear. No effusions. No osseous abnormality.  IMPRESSION: No acute abnormalities.   Electronically Signed   By: Rozetta Nunnery M.D.   On: 02/27/2014 17:05     ASSESSMENT: 64 y.o.  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 rigors 02/27/2014: received IV vancomycin and levaquin briefly, then levaquin po (pcn allergy)  PLAN: I reviewed the labs in detail with Lilymae and her husband. Her WBC has dropped to 29.1, lymph count 23.9. While this is a favorable trend, Vernell does not trust the results. She generally has her labs drawn through her port, and today they were drawn out of her arm. While I explained that this should not make a difference, as the ports are flushed well before drawback occurs, she insists that they be redone today through her port. She is used to seeing her counts improve immediately after treatment, this is bigger swing that she is accustomed to.   I will send Giovanna back to the lab to have the results redrawn as she requests. She will return in 2 weeks for more labs and a follow up visit with Dr. Jana Hakim. She understands and agrees with this plan. She knows the goal of treatment in her case is cure. She has been encouraged to call with any issues that might arise before her next visit here.  Marcelino Duster, [place] [phone] 03/24/2014

## 2014-03-24 NOTE — Patient Instructions (Signed)

## 2014-03-30 DIAGNOSIS — Z1231 Encounter for screening mammogram for malignant neoplasm of breast: Secondary | ICD-10-CM | POA: Diagnosis not present

## 2014-04-03 ENCOUNTER — Other Ambulatory Visit (HOSPITAL_BASED_OUTPATIENT_CLINIC_OR_DEPARTMENT_OTHER): Payer: Medicare Other

## 2014-04-03 ENCOUNTER — Ambulatory Visit (HOSPITAL_BASED_OUTPATIENT_CLINIC_OR_DEPARTMENT_OTHER): Payer: Medicare Other | Admitting: Oncology

## 2014-04-03 ENCOUNTER — Telehealth: Payer: Self-pay | Admitting: Oncology

## 2014-04-03 VITALS — BP 108/52 | HR 79 | Temp 98.2°F | Resp 18 | Ht <= 58 in | Wt 117.1 lb

## 2014-04-03 DIAGNOSIS — R87612 Low grade squamous intraepithelial lesion on cytologic smear of cervix (LGSIL): Secondary | ICD-10-CM

## 2014-04-03 DIAGNOSIS — D630 Anemia in neoplastic disease: Secondary | ICD-10-CM

## 2014-04-03 DIAGNOSIS — D649 Anemia, unspecified: Secondary | ICD-10-CM | POA: Diagnosis not present

## 2014-04-03 DIAGNOSIS — C9110 Chronic lymphocytic leukemia of B-cell type not having achieved remission: Secondary | ICD-10-CM

## 2014-04-03 DIAGNOSIS — R509 Fever, unspecified: Secondary | ICD-10-CM

## 2014-04-03 DIAGNOSIS — C9192 Lymphoid leukemia, unspecified, in relapse: Secondary | ICD-10-CM

## 2014-04-03 DIAGNOSIS — C9112 Chronic lymphocytic leukemia of B-cell type in relapse: Secondary | ICD-10-CM

## 2014-04-03 LAB — COMPREHENSIVE METABOLIC PANEL (CC13)
ALBUMIN: 3.6 g/dL (ref 3.5–5.0)
ALT: 34 U/L (ref 0–55)
AST: 36 U/L — ABNORMAL HIGH (ref 5–34)
Alkaline Phosphatase: 99 U/L (ref 40–150)
Anion Gap: 9 mEq/L (ref 3–11)
BUN: 9.9 mg/dL (ref 7.0–26.0)
CALCIUM: 8.9 mg/dL (ref 8.4–10.4)
CHLORIDE: 105 meq/L (ref 98–109)
CO2: 27 meq/L (ref 22–29)
Creatinine: 0.8 mg/dL (ref 0.6–1.1)
EGFR: 80 mL/min/{1.73_m2} — ABNORMAL LOW (ref >90)
Glucose: 83 mg/dl (ref 70–140)
POTASSIUM: 4.1 meq/L (ref 3.5–5.1)
Sodium: 141 mEq/L (ref 136–145)
TOTAL PROTEIN: 6 g/dL — AB (ref 6.4–8.3)
Total Bilirubin: 0.41 mg/dL (ref 0.20–1.20)

## 2014-04-03 LAB — CBC WITH DIFFERENTIAL/PLATELET
BASO%: 0.4 % (ref 0.0–2.0)
BASOS ABS: 0.1 10*3/uL (ref 0.0–0.1)
EOS ABS: 0.5 10*3/uL (ref 0.0–0.5)
EOS%: 1.4 % (ref 0.0–7.0)
HEMATOCRIT: 37.2 % (ref 34.8–46.6)
HGB: 11.7 g/dL (ref 11.6–15.9)
LYMPH%: 91.6 % — ABNORMAL HIGH (ref 14.0–49.7)
MCH: 30 pg (ref 25.1–34.0)
MCHC: 31.5 g/dL (ref 31.5–36.0)
MCV: 95.1 fL (ref 79.5–101.0)
MONO#: 0.2 10*3/uL (ref 0.1–0.9)
MONO%: 0.4 % (ref 0.0–14.0)
NEUT#: 2.2 10*3/uL (ref 1.5–6.5)
NEUT%: 6.2 % — AB (ref 38.4–76.8)
PLATELETS: 154 10*3/uL (ref 145–400)
RBC: 3.91 10*6/uL (ref 3.70–5.45)
RDW: 14.5 % (ref 11.2–14.5)
WBC: 35.5 10*3/uL — ABNORMAL HIGH (ref 3.9–10.3)
lymph#: 32.5 10*3/uL — ABNORMAL HIGH (ref 0.9–3.3)

## 2014-04-03 LAB — TECHNOLOGIST REVIEW

## 2014-04-03 LAB — LACTATE DEHYDROGENASE (CC13): LDH: 259 U/L — AB (ref 125–245)

## 2014-04-03 NOTE — Progress Notes (Signed)
ID: [name]   DOB: 1950/04/26  MR#: [id number]  RXV#:[id number]  PCP: [name] Md GYN: SU:  OTHER MD: [name] M.D.  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 She has been treated with Rituxan, ofatumumab, and cladribine, with details summarized below.  INTERVAL HISTORY: Rachel [name] returns today for follow up of her chronic lymphoid leukemia accompanied by her husband Rachel [name]. Today is day 27, cycle 4 of 6 planned cycles of bendamustine/ rituximab given every 28 days as tolerated  REVIEW OF SYSTEMS: Rachel [name] is tolerating treatment remarkably well. She has kept her here. After her treatments she will go to the gym and do Zumba. They are actually going to video her doing this because it is so inspirational. She does feel anxious and a little bit down at times, mostly though because of family issues which are chronic and have been previously discussed. She is planning a trip to [place] in May and this pain in June or July. She does have occasional night sweats. She denies fever, rash, or bleeding. She has insomnia. She has some sinus issues. She has heartburn. She has stress urinary incontinence. Known of these are new problems. She did get a little bit of constipation after her last treatment, but she feels this is more diet related than anything else. A detailed review of systems today was otherwise stable.   PAST MEDICAL HISTORY: Past Medical History  Diagnosis Date  . Cancer   . Leukemia   . Lower back pain     PAST SURGICAL HISTORY: Past Surgical History  Procedure Laterality Date  . Tonsillectomy    . Other surgical history      removal of uterine lining  . Lining of uterus removed    . Bunionectomy      right foot  . Breast surgery  2015    left breast bx    FAMILY  HISTORY Family History  Problem Relation Age of Onset  . Depression Son     GYNECOLOGIC HISTORY:  SOCIAL HISTORY: Her husband Rachel [name] is retired from [employer]. Her son Rachel [name] lives in [place] and has 3 sons; he works for a Pharmacist, community. A second son lives in [place] and has two children. Son Rachel [name] lives in [place] and has a daughter, as well as 2 sons by marriage.   ADVANCED DIRECTIVES:   HEALTH MAINTENANCE: History  Substance Use Topics  . Smoking status: Never Smoker   . Smokeless tobacco: Never Used  . Alcohol Use: Yes     Comment: socially     Colonoscopy: due  PAP: December 2012/ [name]  Bone density: Due  Lipid panel: per Dr Rachel [name]  Allergies  Allergen Reactions  . Penicillins Anaphylaxis    "stopped breathing" "drops my heartbeat" "I just pass out"    Current Outpatient Prescriptions  Medication Sig Dispense Refill  . acetaminophen (TYLENOL) 500 MG tablet Take 500 mg by mouth every 6 (six) hours as needed for headache.    Marland Kitchen acyclovir (ZOVIRAX) 400 MG tablet Take 400 mg by mouth daily.    Marland Kitchen allopurinol (ZYLOPRIM) 300 MG tablet Take 300 mg by mouth daily.    Marland Kitchen aspirin 81 MG tablet Take 81 mg by mouth daily.    . Calcium Citrate (CITRACAL PO) Take 4 tablets by mouth daily.     . cholecalciferol (VITAMIN D) 1000 UNITS  tablet Take 4,000 Units by mouth daily.    Marland Kitchen dexamethasone (DECADRON) 4 MG tablet Take 4 mg by mouth 2 (two) times daily. After chemo    . Estradiol (ELESTRIN) 0.52 MG/0.87 GM (0.06%) GEL Apply 2 application topically See admin instructions. 2 pumps thinly to upper arm daily at bedtime    . famotidine (PEPCID) 20 MG tablet Take 1 tablet (20 mg total) by mouth daily. Take 1 tablet daily x 2 days then stop. (Patient not taking: Reported on 03/24/2014)    . fluocinonide ointment (LIDEX) 7.82 % Apply 1 application topically 4 (four) times daily as needed (fever blisters).    Marland Kitchen HYDROcodone-acetaminophen (NORCO/VICODIN) 5-325 MG per tablet Take 1  tablet by mouth every 6 (six) hours as needed for moderate pain. (Patient not taking: Reported on 03/24/2014) 60 tablet 0  . lidocaine-prilocaine (EMLA) cream Apply a nickel size amount to skin on top of port- cover with saran wrap at least 1 hour before access 30 g 0  . LORazepam (ATIVAN) 0.5 MG tablet Take 0.5 mg by mouth at bedtime as needed for anxiety.    Marland Kitchen omeprazole (PRILOSEC) 20 MG capsule Take 20 mg by mouth daily as needed (acid reflux).     . ondansetron (ZOFRAN) 4 MG tablet Take 4 mg by mouth 2 (two) times daily as needed for nausea or vomiting.    Marland Kitchen oxyCODONE-acetaminophen (PERCOCET/ROXICET) 5-325 MG per tablet Take 1-2 tablets by mouth every 4 (four) hours as needed for severe pain.    Marland Kitchen PRESCRIPTION MEDICATION Chemo [facility]    . prochlorperazine (COMPAZINE) 10 MG tablet Take 10 mg by mouth every 6 (six) hours as needed for nausea or vomiting.    . progesterone (PROMETRIUM) 200 MG capsule Take 200 mg by mouth See admin instructions. on days 1-12 every other month    . traZODone (DESYREL) 50 MG tablet Take 50 mg by mouth at bedtime.     No current facility-administered medications for this visit.    OBJECTIVE: Middle-aged white woman in no acute distress Filed Vitals:   04/03/14 0929  BP: 108/52  Pulse: 79  Temp: 98.2 F (36.8 C)  Resp: 18   Filed Weights   04/03/14 0929  Weight: 117 lb 1.6 oz (53.116 kg)   Body mass index is 24.48 kg/(m^2).    ECOG FS: 1  Sclerae unicteric, pupils equal and reactive Oropharynx clear and moist-- no thrush or other lesions  No cervical or supraclavicular adenopathy, no axillary or inguinal adenopathy Lungs no rales or rhonchi Heart regular rate and rhythm Abd soft, nontender, positive bowel sounds MSK no focal spinal tenderness, no upper extremity lymphedema Neuro: nonfocal, well oriented, appropriate affect Breasts: Deferred   LAB RESULTS:  Lab Results  Component Value Date   WBC 35.5* 04/03/2014   NEUTROABS 2.2 04/03/2014   HGB  11.7 04/03/2014   HCT 37.2 04/03/2014   MCV 95.1 04/03/2014   PLT 154 04/03/2014      Chemistry      Component Value Date/Time   NA 141 04/03/2014 0834   NA 139 02/28/2014 0525   K 4.1 04/03/2014 0834   K 3.6 02/28/2014 0525   CL 115* 02/28/2014 0525   CL 101 12/15/2011 1322   CO2 27 04/03/2014 0834   CO2 22 02/28/2014 0525   BUN 9.9 04/03/2014 0834   BUN 10 02/28/2014 0525   CREATININE 0.8 04/03/2014 0834   CREATININE 0.61 02/28/2014 0525      Component Value Date/Time   CALCIUM  8.9 04/03/2014 0834   CALCIUM 7.2* 02/28/2014 0525   ALKPHOS 99 04/03/2014 0834   ALKPHOS 121* 02/27/2014 1705   AST 36* 04/03/2014 0834   AST 142* 02/27/2014 1705   ALT 34 04/03/2014 0834   ALT 71* 02/27/2014 1705   BILITOT 0.41 04/03/2014 0834   BILITOT 0.2* 02/27/2014 1705     Results for AMYIA, LODWICK (MRN [id number]) as of 04/03/2014 09:55  Ref. Range 03/03/2014 15:56 03/06/2014 12:54 03/24/2014 08:38 03/24/2014 13:31 04/03/2014 08:34  lymph# Latest Range: 0.9-3.3 10e3/uL 103.7 (H)  23.9 (H)  32.5 (H)   STUDIES: No results found.   ASSESSMENT: 64 y.o.  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 rigors 02/27/2014: received IV vancomycin and levaquin briefly, then levaquin po (pcn allergy)  PLAN: Rachel [name] is tolerating her treatments remarkably well. She has had a  significant drop in her counts. The problem though is that the drop is very temporary and her counts continue to rise rather quickly after treatment.  We are going to due to more cycles of bendamustine/rituximab, after which we will consider alternatives. In particular we may want to try ibrutinib, but there are many other options that have recently been approved for the treatment of chronic lymphoid leukemia and I will review these with her on her next visit which will be March 9.  In the meantime she will receive her fifth cycle of chemotherapy 2 days from now. She has a good understanding of the overall plan and agrees with it. She knows the goal of treatment in her case is long-term control. She will call with any problems that may develop before her next visit here.   Chauncey Cruel, [place] [phone] 04/03/2014

## 2014-04-03 NOTE — Addendum Note (Signed)
Addended by: Prentiss Bells on: 04/03/2014 10:39 AM   Modules accepted: Orders, Medications

## 2014-04-03 NOTE — Telephone Encounter (Signed)
per pof to sch pt appt-sent Anne/Melissa email to override appt-will send MW mail to sch pt trmt after appt set

## 2014-04-04 ENCOUNTER — Telehealth: Payer: Self-pay | Admitting: *Deleted

## 2014-04-04 NOTE — Telephone Encounter (Signed)
Per staff message from desk RN I have added time to the treatment on 2/11

## 2014-04-05 ENCOUNTER — Other Ambulatory Visit: Payer: Self-pay | Admitting: Oncology

## 2014-04-05 ENCOUNTER — Telehealth: Payer: Self-pay | Admitting: *Deleted

## 2014-04-05 ENCOUNTER — Ambulatory Visit (HOSPITAL_BASED_OUTPATIENT_CLINIC_OR_DEPARTMENT_OTHER): Payer: Medicare Other

## 2014-04-05 VITALS — BP 90/48 | HR 82 | Temp 98.3°F | Resp 20

## 2014-04-05 DIAGNOSIS — Z5111 Encounter for antineoplastic chemotherapy: Secondary | ICD-10-CM

## 2014-04-05 DIAGNOSIS — Z5112 Encounter for antineoplastic immunotherapy: Secondary | ICD-10-CM | POA: Diagnosis not present

## 2014-04-05 DIAGNOSIS — C9110 Chronic lymphocytic leukemia of B-cell type not having achieved remission: Secondary | ICD-10-CM | POA: Diagnosis not present

## 2014-04-05 MED ORDER — ACETAMINOPHEN 325 MG PO TABS
650.0000 mg | ORAL_TABLET | Freq: Once | ORAL | Status: AC
  Administered 2014-04-05: 650 mg via ORAL

## 2014-04-05 MED ORDER — DEXAMETHASONE SODIUM PHOSPHATE 10 MG/ML IJ SOLN
INTRAMUSCULAR | Status: AC
Start: 2014-04-05 — End: 2014-04-05
  Filled 2014-04-05: qty 1

## 2014-04-05 MED ORDER — DIPHENHYDRAMINE HCL 25 MG PO CAPS
ORAL_CAPSULE | ORAL | Status: AC
Start: 2014-04-05 — End: 2014-04-05
  Filled 2014-04-05: qty 1

## 2014-04-05 MED ORDER — ONDANSETRON 8 MG/NS 50 ML IVPB
INTRAVENOUS | Status: AC
  Filled 2014-04-05: qty 8

## 2014-04-05 MED ORDER — SODIUM CHLORIDE 0.9 % IJ SOLN
10.0000 mL | INTRAMUSCULAR | Status: DC | PRN
  Administered 2014-04-05: 10 mL
  Filled 2014-04-05: qty 10

## 2014-04-05 MED ORDER — SODIUM CHLORIDE 0.9 % IV SOLN
375.0000 mg/m2 | Freq: Once | INTRAVENOUS | Status: AC
  Administered 2014-04-05: 600 mg via INTRAVENOUS
  Filled 2014-04-05: qty 60

## 2014-04-05 MED ORDER — DIPHENHYDRAMINE HCL 25 MG PO CAPS
25.0000 mg | ORAL_CAPSULE | Freq: Once | ORAL | Status: AC
  Administered 2014-04-05: 25 mg via ORAL

## 2014-04-05 MED ORDER — HEPARIN SOD (PORK) LOCK FLUSH 100 UNIT/ML IV SOLN
500.0000 [IU] | Freq: Once | INTRAVENOUS | Status: AC | PRN
  Administered 2014-04-05: 500 [IU]
  Filled 2014-04-05: qty 5

## 2014-04-05 MED ORDER — ACETAMINOPHEN 325 MG PO TABS
ORAL_TABLET | ORAL | Status: AC
  Filled 2014-04-05: qty 2

## 2014-04-05 MED ORDER — DEXAMETHASONE SODIUM PHOSPHATE 10 MG/ML IJ SOLN
10.0000 mg | Freq: Once | INTRAMUSCULAR | Status: AC
  Administered 2014-04-05: 10 mg via INTRAVENOUS

## 2014-04-05 MED ORDER — BENDAMUSTINE HCL CHEMO INJECTION 180 MG/2ML
90.0000 mg/m2 | Freq: Once | INTRAVENOUS | Status: AC
  Administered 2014-04-05: 135 mg via INTRAVENOUS
  Filled 2014-04-05: qty 1.5

## 2014-04-05 MED ORDER — SODIUM CHLORIDE 0.9 % IV SOLN
Freq: Once | INTRAVENOUS | Status: AC
  Administered 2014-04-05: 10:00:00 via INTRAVENOUS

## 2014-04-05 MED ORDER — ONDANSETRON 8 MG/50ML IVPB (CHCC)
8.0000 mg | Freq: Once | INTRAVENOUS | Status: AC
  Administered 2014-04-05: 8 mg via INTRAVENOUS

## 2014-04-05 NOTE — Telephone Encounter (Signed)
Per staff message and POF I have scheduled appts. Advised scheduler of appts. JMW  

## 2014-04-05 NOTE — Patient Instructions (Signed)
[facility] Discharge Instructions for Patients Receiving Chemotherapy  Today you received the following chemotherapy agents Rituxan/Treanda.   To help prevent nausea and vomiting after your treatment, we encourage you to take your nausea medication as directed.    If you develop nausea and vomiting that is not controlled by your nausea medication, call the clinic.   BELOW ARE SYMPTOMS THAT SHOULD BE REPORTED IMMEDIATELY:  *FEVER GREATER THAN 100.5 F  *CHILLS WITH OR WITHOUT FEVER  NAUSEA AND VOMITING THAT IS NOT CONTROLLED WITH YOUR NAUSEA MEDICATION  *UNUSUAL SHORTNESS OF BREATH  *UNUSUAL BRUISING OR BLEEDING  TENDERNESS IN MOUTH AND THROAT WITH OR WITHOUT PRESENCE OF ULCERS  *URINARY PROBLEMS  *BOWEL PROBLEMS  UNUSUAL RASH Items with * indicate a potential emergency and should be followed up as soon as possible.  Feel free to call the clinic you have any questions or concerns. The clinic phone number is [phone].

## 2014-04-06 ENCOUNTER — Ambulatory Visit (HOSPITAL_BASED_OUTPATIENT_CLINIC_OR_DEPARTMENT_OTHER): Payer: Medicare Other

## 2014-04-06 DIAGNOSIS — C9110 Chronic lymphocytic leukemia of B-cell type not having achieved remission: Secondary | ICD-10-CM

## 2014-04-06 DIAGNOSIS — Z5111 Encounter for antineoplastic chemotherapy: Secondary | ICD-10-CM

## 2014-04-06 MED ORDER — ONDANSETRON 8 MG/50ML IVPB (CHCC)
8.0000 mg | Freq: Once | INTRAVENOUS | Status: AC
  Administered 2014-04-06: 8 mg via INTRAVENOUS

## 2014-04-06 MED ORDER — SODIUM CHLORIDE 0.9 % IJ SOLN
10.0000 mL | INTRAMUSCULAR | Status: DC | PRN
  Administered 2014-04-06: 10 mL
  Filled 2014-04-06: qty 10

## 2014-04-06 MED ORDER — HEPARIN SOD (PORK) LOCK FLUSH 100 UNIT/ML IV SOLN
500.0000 [IU] | Freq: Once | INTRAVENOUS | Status: AC | PRN
  Administered 2014-04-06: 500 [IU]
  Filled 2014-04-06: qty 5

## 2014-04-06 MED ORDER — SODIUM CHLORIDE 0.9 % IV SOLN
Freq: Once | INTRAVENOUS | Status: AC
  Administered 2014-04-06: 11:00:00 via INTRAVENOUS

## 2014-04-06 MED ORDER — SODIUM CHLORIDE 0.9 % IV SOLN
90.0000 mg/m2 | Freq: Once | INTRAVENOUS | Status: AC
  Administered 2014-04-06: 135 mg via INTRAVENOUS
  Filled 2014-04-06: qty 1.5

## 2014-04-06 MED ORDER — DEXAMETHASONE SODIUM PHOSPHATE 10 MG/ML IJ SOLN
10.0000 mg | Freq: Once | INTRAMUSCULAR | Status: AC
  Administered 2014-04-06: 10 mg via INTRAVENOUS

## 2014-04-06 MED ORDER — ONDANSETRON 8 MG/NS 50 ML IVPB
INTRAVENOUS | Status: AC
  Filled 2014-04-06: qty 8

## 2014-04-06 MED ORDER — DEXAMETHASONE SODIUM PHOSPHATE 10 MG/ML IJ SOLN
INTRAMUSCULAR | Status: AC
  Filled 2014-04-06: qty 1

## 2014-04-06 NOTE — Patient Instructions (Signed)
[facility] Discharge Instructions for Patients Receiving Chemotherapy  Today you received the following chemotherapy agent: Treanda   To help prevent nausea and vomiting after your treatment, we encourage you to take your nausea medication as prescribed.    If you develop nausea and vomiting that is not controlled by your nausea medication, call the clinic.   BELOW ARE SYMPTOMS THAT SHOULD BE REPORTED IMMEDIATELY:  *FEVER GREATER THAN 100.5 F  *CHILLS WITH OR WITHOUT FEVER  NAUSEA AND VOMITING THAT IS NOT CONTROLLED WITH YOUR NAUSEA MEDICATION  *UNUSUAL SHORTNESS OF BREATH  *UNUSUAL BRUISING OR BLEEDING  TENDERNESS IN MOUTH AND THROAT WITH OR WITHOUT PRESENCE OF ULCERS  *URINARY PROBLEMS  *BOWEL PROBLEMS  UNUSUAL RASH Items with * indicate a potential emergency and should be followed up as soon as possible.  Feel free to call the clinic you have any questions or concerns. The clinic phone number is [phone].    

## 2014-04-20 ENCOUNTER — Encounter: Payer: Self-pay | Admitting: Nurse Practitioner

## 2014-04-20 ENCOUNTER — Other Ambulatory Visit (HOSPITAL_BASED_OUTPATIENT_CLINIC_OR_DEPARTMENT_OTHER): Payer: Medicare Other

## 2014-04-20 ENCOUNTER — Ambulatory Visit (HOSPITAL_BASED_OUTPATIENT_CLINIC_OR_DEPARTMENT_OTHER): Payer: Medicare Other | Admitting: Nurse Practitioner

## 2014-04-20 VITALS — BP 102/64 | HR 73 | Temp 98.5°F | Resp 18 | Ht <= 58 in | Wt 117.4 lb

## 2014-04-20 DIAGNOSIS — D649 Anemia, unspecified: Secondary | ICD-10-CM

## 2014-04-20 DIAGNOSIS — R87612 Low grade squamous intraepithelial lesion on cytologic smear of cervix (LGSIL): Secondary | ICD-10-CM

## 2014-04-20 DIAGNOSIS — C9110 Chronic lymphocytic leukemia of B-cell type not having achieved remission: Secondary | ICD-10-CM

## 2014-04-20 DIAGNOSIS — D630 Anemia in neoplastic disease: Secondary | ICD-10-CM

## 2014-04-20 DIAGNOSIS — C9192 Lymphoid leukemia, unspecified, in relapse: Secondary | ICD-10-CM

## 2014-04-20 DIAGNOSIS — R509 Fever, unspecified: Secondary | ICD-10-CM

## 2014-04-20 DIAGNOSIS — C9112 Chronic lymphocytic leukemia of B-cell type in relapse: Secondary | ICD-10-CM

## 2014-04-20 LAB — COMPREHENSIVE METABOLIC PANEL (CC13)
ALBUMIN: 3.6 g/dL (ref 3.5–5.0)
ALT: 27 U/L (ref 0–55)
AST: 33 U/L (ref 5–34)
Alkaline Phosphatase: 99 U/L (ref 40–150)
Anion Gap: 10 mEq/L (ref 3–11)
BILIRUBIN TOTAL: 0.34 mg/dL (ref 0.20–1.20)
BUN: 10.4 mg/dL (ref 7.0–26.0)
CALCIUM: 9 mg/dL (ref 8.4–10.4)
CO2: 25 meq/L (ref 22–29)
CREATININE: 0.8 mg/dL (ref 0.6–1.1)
Chloride: 105 mEq/L (ref 98–109)
EGFR: 78 mL/min/{1.73_m2} — ABNORMAL LOW (ref >90)
GLUCOSE: 95 mg/dL (ref 70–140)
Potassium: 4.1 mEq/L (ref 3.5–5.1)
Sodium: 141 mEq/L (ref 136–145)
Total Protein: 6.2 g/dL — ABNORMAL LOW (ref 6.4–8.3)

## 2014-04-20 LAB — CBC WITH DIFFERENTIAL/PLATELET
BASO%: 0.5 % (ref 0.0–2.0)
BASOS ABS: 0.1 10*3/uL (ref 0.0–0.1)
EOS%: 1.4 % (ref 0.0–7.0)
Eosinophils Absolute: 0.4 10*3/uL (ref 0.0–0.5)
HCT: 38.8 % (ref 34.8–46.6)
HEMOGLOBIN: 12.1 g/dL (ref 11.6–15.9)
LYMPH%: 90.6 % — ABNORMAL HIGH (ref 14.0–49.7)
MCH: 29.7 pg (ref 25.1–34.0)
MCHC: 31.1 g/dL — ABNORMAL LOW (ref 31.5–36.0)
MCV: 95.5 fL (ref 79.5–101.0)
MONO#: 0.1 10*3/uL (ref 0.1–0.9)
MONO%: 0.5 % (ref 0.0–14.0)
NEUT%: 7 % — ABNORMAL LOW (ref 38.4–76.8)
NEUTROS ABS: 2.2 10*3/uL (ref 1.5–6.5)
Platelets: 146 10*3/uL (ref 145–400)
RBC: 4.06 10*6/uL (ref 3.70–5.45)
RDW: 13.6 % (ref 11.2–14.5)
WBC: 30.6 10*3/uL — ABNORMAL HIGH (ref 3.9–10.3)
lymph#: 27.8 10*3/uL — ABNORMAL HIGH (ref 0.9–3.3)

## 2014-04-20 LAB — TECHNOLOGIST REVIEW

## 2014-04-20 LAB — LACTATE DEHYDROGENASE (CC13): LDH: 249 U/L — ABNORMAL HIGH (ref 125–245)

## 2014-04-20 NOTE — Progress Notes (Signed)
ID: Rachel [name]   DOB: [birth year]  MR#: [id number]  XVQ#:[id number]  PCP: [name] Md GYN: SU:  OTHER MD: [name] M.D.  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 She has been treated with Rituxan, ofatumumab, and cladribine, with details summarized below.  INTERVAL HISTORY: Rachel [name] returns today for follow up of her chronic lymphoid leukemia.. Today is day 16, cycle 5 of 6 planned cycles of bendamustine/ rituximab given every 28 days as tolerated  REVIEW OF SYSTEMS: This cycle Jo complained of headaches and neck discomfort. She says at one point she could palpate the lymph nodes along her neck. She was constipated and had lower abdominal discomfort but this has resolved. She wonders if she triggered her diverticulitis after not chewing some carrots well. Her appetite has decreased. She was nauseous and had vomited once despite compazine use. She had an "extreme case" of heartburn one night. She has night sweats, but these are unchanged. She has difficulty sleeping at night. She had no fevers or rashes. She continues to participate in [place] several times weekly. A detailed review of systems is otherwise stable.   PAST MEDICAL HISTORY: Past Medical History  Diagnosis Date  . Cancer   . Leukemia   . Lower back pain     PAST SURGICAL HISTORY: Past Surgical History  Procedure Laterality Date  . Tonsillectomy    . Other surgical history      removal of uterine lining  . Lining of uterus removed    . Bunionectomy      right foot  . Breast surgery  2015    left breast bx    FAMILY HISTORY Family History  Problem Relation Age of Onset  . Depression Son     GYNECOLOGIC HISTORY:  SOCIAL HISTORY: [name] is retired from [employer]. Her son [name]  lives in [place] and has 3 sons; he works for a Pharmacist, community. A second son lives in [place] and has two children. Son [name] lives in [place] and has a daughter, as well as 2 sons by marriage.   ADVANCED DIRECTIVES:   HEALTH MAINTENANCE: History  Substance Use Topics  . Smoking status: Never Smoker   . Smokeless tobacco: Never Used  . Alcohol Use: Yes     Comment: socially     Colonoscopy: due  PAP: December 2012/ [name]  Bone density: Due  Lipid panel: per Dr Drema Dallas  Allergies  Allergen Reactions  . Penicillins Anaphylaxis    "stopped breathing" "drops my heartbeat" "I just pass out"    Current Outpatient Prescriptions  Medication Sig Dispense Refill  . acetaminophen (TYLENOL) 500 MG tablet Take 500 mg by mouth every 6 (six) hours as needed for headache.    Marland Kitchen acyclovir (ZOVIRAX) 400 MG tablet Take 400 mg by mouth daily.    Marland Kitchen allopurinol (ZYLOPRIM) 300 MG tablet Take 300 mg by mouth daily.    Marland Kitchen aspirin 81 MG tablet Take 81 mg by mouth daily.    . Calcium Citrate (CITRACAL PO) Take 4 tablets by mouth daily.     . cholecalciferol (VITAMIN D) 1000 UNITS tablet Take 4,000 Units by mouth daily.    . Estradiol (ELESTRIN) 0.52 MG/0.87 GM (0.06%) GEL Apply 2 application topically See admin instructions. 2 pumps thinly to upper arm daily at bedtime    . lidocaine-prilocaine (EMLA) cream  Apply a nickel size amount to skin on top of port- cover with saran wrap at least 1 hour before access 30 g 0  . prochlorperazine (COMPAZINE) 10 MG tablet Take 10 mg by mouth every 6 (six) hours as needed for nausea or vomiting.    . progesterone (PROMETRIUM) 200 MG capsule Take 200 mg by mouth See admin instructions. on days 1-12 every other month    . traZODone (DESYREL) 50 MG tablet Take 50 mg by mouth at bedtime.     No current facility-administered medications for this visit.    OBJECTIVE: Middle-aged white woman in no acute distress Filed Vitals:   04/20/14 1307  BP: 102/64  Pulse: 73   Temp: 98.5 F (36.9 C)  Resp: 18   Filed Weights   04/20/14 1307  Weight: 117 lb 6.4 oz (53.252 kg)   Body mass index is 24.54 kg/(m^2).    ECOG FS: 1  Skin: warm, dry  HEENT: sclerae anicteric, conjunctivae pink, oropharynx clear. No thrush or mucositis.  Lymph Nodes: No cervical or supraclavicular lymphadenopathy, cervical tenderness however Lungs: clear to auscultation bilaterally, no rales, wheezes, or rhonci  Heart: regular rate and rhythm  Abdomen: round, soft, non tender, positive bowel sounds  Musculoskeletal: No focal spinal tenderness, no peripheral edema  Neuro: non focal, well oriented, positive affect  Breast: deferred   LAB RESULTS:  Lab Results  Component Value Date   WBC 30.6* 04/20/2014   NEUTROABS 2.2 04/20/2014   HGB 12.1 04/20/2014   HCT 38.8 04/20/2014   MCV 95.5 04/20/2014   PLT 146 04/20/2014      Chemistry      Component Value Date/Time   NA 141 04/03/2014 0834   NA 139 02/28/2014 0525   K 4.1 04/03/2014 0834   K 3.6 02/28/2014 0525   CL 115* 02/28/2014 0525   CL 101 12/15/2011 1322   CO2 27 04/03/2014 0834   CO2 22 02/28/2014 0525   BUN 9.9 04/03/2014 0834   BUN 10 02/28/2014 0525   CREATININE 0.8 04/03/2014 0834   CREATININE 0.61 02/28/2014 0525      Component Value Date/Time   CALCIUM 8.9 04/03/2014 0834   CALCIUM 7.2* 02/28/2014 0525   ALKPHOS 99 04/03/2014 0834   ALKPHOS 121* 02/27/2014 1705   AST 36* 04/03/2014 0834   AST 142* 02/27/2014 1705   ALT 34 04/03/2014 0834   ALT 71* 02/27/2014 1705   BILITOT 0.41 04/03/2014 0834   BILITOT 0.2* 02/27/2014 1705     Results for RAYME, BUI (MRN [id number]) as of 04/03/2014 09:55  Ref. Range 03/03/2014 15:56 03/06/2014 12:54 03/24/2014 08:38 03/24/2014 13:31 04/03/2014 08:34  lymph# Latest Range: 0.9-3.3 10e3/uL 103.7 (H)  23.9 (H)  32.5 (H)   STUDIES: No results found.   ASSESSMENT: 65 y.o.  Rachel [name] woman with a history of chronic lymphoid leukemia/well-differentiated  lymphocytic lymphoma dating back to January of 2002 treated with Rituxan in 2004 and 2008 and January/February 2012  (1) ofatumumab started 12/10/2011, with a complete remission not obtained after the first 8 weekly treatments; an additional 4 monthly treatments completed 06/08/2012  (2) cladribine x6 completed 04/06/2012  (3) started bendamustine/ rituximab 12/12/2013, to be repeated every 28 days for 4-6 cycles  (a) allopurinol and acyclovir started OCT 2015   (4) anemia: B-12 and folate WNL 02/28/2014; ferritin 48; absolute retic count 41.3, nl MCV  (5) reaction to rituximab vs. rigors 02/27/2014: received IV vancomycin and levaquin briefly, then levaquin po (pcn allergy)  PLAN: Rachel [name]  is disappointed in the lab results. Her WBC is still in the 30s. She states that she feels like she doesn't know her body anymore, and typically the rituximab produces better results for her. She is willing to finish out the 6 cycles that was agreed upon when starting the bendamustine and rituximab, but moving forward she would like a new treatment plan.  Rachel [name] will return in 2 weeks for the start of cycle 6. She understands and agrees with this plan. She knows the goal of treatment in her case is control. She has been encouraged to call with any issues that might arise before her next visit here.    Marcelino Duster, [place] [phone] 04/20/2014

## 2014-05-01 ENCOUNTER — Other Ambulatory Visit (HOSPITAL_BASED_OUTPATIENT_CLINIC_OR_DEPARTMENT_OTHER): Payer: Medicare Other

## 2014-05-01 DIAGNOSIS — C9110 Chronic lymphocytic leukemia of B-cell type not having achieved remission: Secondary | ICD-10-CM | POA: Diagnosis not present

## 2014-05-01 DIAGNOSIS — R509 Fever, unspecified: Secondary | ICD-10-CM

## 2014-05-01 DIAGNOSIS — C9112 Chronic lymphocytic leukemia of B-cell type in relapse: Secondary | ICD-10-CM

## 2014-05-01 DIAGNOSIS — D630 Anemia in neoplastic disease: Secondary | ICD-10-CM

## 2014-05-01 DIAGNOSIS — C9192 Lymphoid leukemia, unspecified, in relapse: Secondary | ICD-10-CM

## 2014-05-01 DIAGNOSIS — R87612 Low grade squamous intraepithelial lesion on cytologic smear of cervix (LGSIL): Secondary | ICD-10-CM

## 2014-05-01 LAB — LACTATE DEHYDROGENASE (CC13): LDH: 238 U/L (ref 125–245)

## 2014-05-01 LAB — COMPREHENSIVE METABOLIC PANEL (CC13)
ALK PHOS: 100 U/L (ref 40–150)
ALT: 27 U/L (ref 0–55)
AST: 35 U/L — AB (ref 5–34)
Albumin: 3.5 g/dL (ref 3.5–5.0)
Anion Gap: 6 mEq/L (ref 3–11)
BUN: 8.8 mg/dL (ref 7.0–26.0)
CO2: 26 mEq/L (ref 22–29)
Calcium: 8.9 mg/dL (ref 8.4–10.4)
Chloride: 106 mEq/L (ref 98–109)
Creatinine: 0.8 mg/dL (ref 0.6–1.1)
EGFR: 78 mL/min/{1.73_m2} — ABNORMAL LOW (ref >90)
Glucose: 106 mg/dl (ref 70–140)
Potassium: 3.9 mEq/L (ref 3.5–5.1)
SODIUM: 138 meq/L (ref 136–145)
TOTAL PROTEIN: 6 g/dL — AB (ref 6.4–8.3)
Total Bilirubin: 0.49 mg/dL (ref 0.20–1.20)

## 2014-05-01 LAB — CBC WITH DIFFERENTIAL/PLATELET
BASO%: 0.7 % (ref 0.0–2.0)
Basophils Absolute: 0.2 10*3/uL — ABNORMAL HIGH (ref 0.0–0.1)
EOS ABS: 0.6 10*3/uL — AB (ref 0.0–0.5)
EOS%: 1.7 % (ref 0.0–7.0)
HCT: 37.9 % (ref 34.8–46.6)
HGB: 12 g/dL (ref 11.6–15.9)
LYMPH%: 80.7 % — ABNORMAL HIGH (ref 14.0–49.7)
MCH: 30.1 pg (ref 25.1–34.0)
MCHC: 31.8 g/dL (ref 31.5–36.0)
MCV: 94.8 fL (ref 79.5–101.0)
MONO#: 1.3 10*3/uL — ABNORMAL HIGH (ref 0.1–0.9)
MONO%: 4.1 % (ref 0.0–14.0)
NEUT%: 12.8 % — ABNORMAL LOW (ref 38.4–76.8)
NEUTROS ABS: 4.3 10*3/uL (ref 1.5–6.5)
Platelets: 136 10*3/uL — ABNORMAL LOW (ref 145–400)
RBC: 4 10*6/uL (ref 3.70–5.45)
RDW: 13.3 % (ref 11.2–14.5)
WBC: 33.2 10*3/uL — ABNORMAL HIGH (ref 3.9–10.3)
lymph#: 26.8 10*3/uL — ABNORMAL HIGH (ref 0.9–3.3)

## 2014-05-01 LAB — TECHNOLOGIST REVIEW

## 2014-05-03 ENCOUNTER — Telehealth: Payer: Self-pay | Admitting: Oncology

## 2014-05-03 ENCOUNTER — Ambulatory Visit (HOSPITAL_BASED_OUTPATIENT_CLINIC_OR_DEPARTMENT_OTHER): Payer: Medicare Other | Admitting: Oncology

## 2014-05-03 ENCOUNTER — Ambulatory Visit (HOSPITAL_BASED_OUTPATIENT_CLINIC_OR_DEPARTMENT_OTHER): Payer: Medicare Other

## 2014-05-03 VITALS — BP 112/54 | HR 70 | Temp 98.5°F | Resp 18 | Ht <= 58 in | Wt 116.2 lb

## 2014-05-03 DIAGNOSIS — Z5111 Encounter for antineoplastic chemotherapy: Secondary | ICD-10-CM

## 2014-05-03 DIAGNOSIS — R87612 Low grade squamous intraepithelial lesion on cytologic smear of cervix (LGSIL): Secondary | ICD-10-CM

## 2014-05-03 DIAGNOSIS — C9110 Chronic lymphocytic leukemia of B-cell type not having achieved remission: Secondary | ICD-10-CM

## 2014-05-03 DIAGNOSIS — Z5112 Encounter for antineoplastic immunotherapy: Secondary | ICD-10-CM

## 2014-05-03 DIAGNOSIS — D649 Anemia, unspecified: Secondary | ICD-10-CM | POA: Diagnosis not present

## 2014-05-03 DIAGNOSIS — D630 Anemia in neoplastic disease: Secondary | ICD-10-CM

## 2014-05-03 MED ORDER — IBRUTINIB 140 MG PO CAPS: 420.0000 mg | ORAL_CAPSULE | Freq: Every day | ORAL | Status: DC

## 2014-05-03 MED ORDER — SODIUM CHLORIDE 0.9 % IV SOLN
Freq: Once | INTRAVENOUS | Status: AC
  Administered 2014-05-03: 15:00:00 via INTRAVENOUS
  Filled 2014-05-03: qty 4

## 2014-05-03 MED ORDER — SODIUM CHLORIDE 0.9 % IV SOLN
90.0000 mg/m2 | Freq: Once | INTRAVENOUS | Status: AC
  Administered 2014-05-03: 135 mg via INTRAVENOUS
  Filled 2014-05-03: qty 1.5

## 2014-05-03 MED ORDER — ACETAMINOPHEN 325 MG PO TABS
650.0000 mg | ORAL_TABLET | Freq: Once | ORAL | Status: AC
  Administered 2014-05-03: 650 mg via ORAL

## 2014-05-03 MED ORDER — DIPHENHYDRAMINE HCL 25 MG PO CAPS
25.0000 mg | ORAL_CAPSULE | Freq: Once | ORAL | Status: AC
  Administered 2014-05-03: 25 mg via ORAL

## 2014-05-03 MED ORDER — IBRUTINIB 140 MG PO CAPS
420.0000 mg | ORAL_CAPSULE | Freq: Every day | ORAL | Status: DC
Start: 2014-05-03 — End: 2014-07-25

## 2014-05-03 MED ORDER — ACETAMINOPHEN 325 MG PO TABS
ORAL_TABLET | ORAL | Status: AC
  Filled 2014-05-03: qty 2

## 2014-05-03 MED ORDER — SODIUM CHLORIDE 0.9 % IV SOLN
Freq: Once | INTRAVENOUS | Status: AC
Start: 2014-05-03 — End: 2014-05-03
  Administered 2014-05-03: 14:00:00 via INTRAVENOUS

## 2014-05-03 MED ORDER — SODIUM CHLORIDE 0.9 % IV SOLN
Freq: Once | INTRAVENOUS | Status: AC
  Administered 2014-05-03: 09:00:00 via INTRAVENOUS

## 2014-05-03 MED ORDER — SODIUM CHLORIDE 0.9 % IV SOLN
375.0000 mg/m2 | Freq: Once | INTRAVENOUS | Status: AC
  Administered 2014-05-03: 600 mg via INTRAVENOUS
  Filled 2014-05-03: qty 60

## 2014-05-03 MED ORDER — HEPARIN SOD (PORK) LOCK FLUSH 100 UNIT/ML IV SOLN
500.0000 [IU] | Freq: Once | INTRAVENOUS | Status: AC | PRN
  Administered 2014-05-03: 500 [IU]
  Filled 2014-05-03: qty 5

## 2014-05-03 MED ORDER — SODIUM CHLORIDE 0.9 % IJ SOLN
10.0000 mL | INTRAMUSCULAR | Status: DC | PRN
  Administered 2014-05-03: 10 mL
  Filled 2014-05-03: qty 10

## 2014-05-03 MED ORDER — DIPHENHYDRAMINE HCL 25 MG PO CAPS
ORAL_CAPSULE | ORAL | Status: AC
  Filled 2014-05-03: qty 1

## 2014-05-03 MED ORDER — SODIUM CHLORIDE 0.9 % IV SOLN
Freq: Once | INTRAVENOUS | Status: AC
  Administered 2014-05-03: 11:00:00 via INTRAVENOUS

## 2014-05-03 NOTE — Progress Notes (Signed)
1115-BP 86/46, pt states that she feels "restless" at this time. Rituxan running at 103 ml/hr.-200 mg/hr.  Rituxan stopped and Dr. Jana Hakim notified of pt.'s condition.  Order received to stop Rituxan and to administer 500 ml of NS over 30 minutes.  At completion of NS, recheck BP and if BP is within pt.'s normal limits, may resume Rituxan at prior rate of 103 ml/hr.  1155-BP now 85/41, HR-66.  Pt states that restlessness has resolved.  Dr. Jana Hakim notified and order received to give pt additional 237ml NS over 30 minutes.  Pt  Instructed of MD orders to administer fluids, and pt is refusing additional fluids and requests to proceed with Rituxan.  Dr. Jana Hakim notified of pt request and order received to proceed with Rituxan at lower rate of 100 mg/hr-52 ml/hr and to increase rate as pt tolerates.  1205-Rituxan restarted at 52 ml/hr.  1340-BP now 88/50 and pt complaining of dizziness.  NS hung at 500 ml/hr for 250 ml.    1415-BP-100/34.  Rituxan restarted at 103 ml/hr-52 ml-pt without complaints at this time

## 2014-05-03 NOTE — Addendum Note (Signed)
Addended by: Lurline Del C on: 05/03/2014 11:06 AM   Modules accepted: Orders

## 2014-05-03 NOTE — Telephone Encounter (Signed)
per pof to sch pt appt-cld & leftpt -message of time/date of appt °

## 2014-05-03 NOTE — Patient Instructions (Signed)
[facility] Discharge Instructions for Patients Receiving Chemotherapy  Today you received the following chemotherapy agents:  Rituxan and Treanda  To help prevent nausea and vomiting after your treatment, we encourage you to take your nausea medication as ordered per MD.   If you develop nausea and vomiting that is not controlled by your nausea medication, call the clinic.   BELOW ARE SYMPTOMS THAT SHOULD BE REPORTED IMMEDIATELY:  *FEVER GREATER THAN 100.5 F  *CHILLS WITH OR WITHOUT FEVER  NAUSEA AND VOMITING THAT IS NOT CONTROLLED WITH YOUR NAUSEA MEDICATION  *UNUSUAL SHORTNESS OF BREATH  *UNUSUAL BRUISING OR BLEEDING  TENDERNESS IN MOUTH AND THROAT WITH OR WITHOUT PRESENCE OF ULCERS  *URINARY PROBLEMS  *BOWEL PROBLEMS  UNUSUAL RASH Items with * indicate a potential emergency and should be followed up as soon as possible.  Feel free to call the clinic you have any questions or concerns. The clinic phone number is [phone].

## 2014-05-03 NOTE — Addendum Note (Signed)
Addended by: Chauncey Cruel on: 05/03/2014 09:10 AM   Modules accepted: Orders

## 2014-05-03 NOTE — Progress Notes (Signed)
1340 VS obtained, BP 88/50, patient denies lightheadedness or dizziness. Since BP remains low, rate not increased and remains at 300 mg/hr (155 ml/hr) for duration of infusion. [place] called into room, patient's family reporting now she feels dizzy. Rituxan stopped, 250 cc NS infusing over 30 minutes per earlier order from MD that patient had refused.  1355 patient eating lunch in bed. IVF infusing. Will recheck BP. Patient states "i only feel dizziness when I turn quick." will monitor.

## 2014-05-03 NOTE — Progress Notes (Signed)
ID: Rachel [name]   DOB: 02-Aug-1950  MR#: [id number]  TSV#:[id number]  PCP: [name] Md GYN: SU:  OTHER MD: [name] M.D.  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 She has been treated with Rituxan, ofatumumab, and cladribine, with details summarized below.  INTERVAL HISTORY: Rachel [name] returns today for follow up of her chronic lymphoid leukemia.. Today is day 1, cycle 6 of 6 planned cycles of bendamustine/ rituximab given every 28 days. She has generally done very well with the treatments, with no fever, rash, or bleeding. She has had some skin pre-cancers that became a little inflamed initially then settled down. She has had minimal to no nausea. She had no hair loss. She has an excellent energy and goes to the gym every day. Her only concern is that after 5 cycles her total white cell count and absolute lymphocyte count have not normalized. The lymph nodes she was palpating previously are no longer palpable.  REVIEW OF SYSTEMS: She had a mild nuchal headache for a day or 2 after the last treatment. She has insomnia problems. Occasionally she complains of blurred vision, but this is very intermittent. She has a little bit of a runny nose. She has chronic heartburn. Her appetite is poor and her sense of taste is altered. She has lost her habits 4 pounds. She has stress urinary incontinence which is not new. She has low back pain which is not more intense or persistent than before. She feels anxious and depressed. She doesn't have so much hot flashes as chills. A detailed review of systems today was otherwise noncontributory  PAST MEDICAL HISTORY: Past Medical History  Diagnosis Date  . Cancer   . Leukemia   . Lower back pain     PAST SURGICAL HISTORY: Past Surgical History   Procedure Laterality Date  . Tonsillectomy    . Other surgical history      removal of uterine lining  . Lining of uterus removed    . Bunionectomy      right foot  . Breast surgery  2015    left breast bx    FAMILY HISTORY Family History  Problem Relation Age of Onset  . Depression Son     GYNECOLOGIC HISTORY:  SOCIAL HISTORY: Her husband Rachel [name] is retired from [employer]. Her son Rachel [name] lives in [place] and has 3 sons; he works for a Pharmacist, community. A second son lives in [place] and has two children. Son Rachel [name] lives in [place] and has a daughter, as well as 2 sons by marriage.   ADVANCED DIRECTIVES:   HEALTH MAINTENANCE: History  Substance Use Topics  . Smoking status: Never Smoker   . Smokeless tobacco: Never Used  . Alcohol Use: Yes     Comment: socially     Colonoscopy: due  PAP: December 2012/ [name]  Bone density: Due  Lipid panel: per Dr Drema Dallas  Allergies  Allergen Reactions  . Penicillins Anaphylaxis    "stopped breathing" "drops my heartbeat" "I just pass out"    Current Outpatient Prescriptions  Medication Sig Dispense Refill  . acetaminophen (TYLENOL) 500 MG tablet Take 500 mg by mouth every 6 (six) hours as needed for headache.    Marland Kitchen acyclovir (ZOVIRAX) 400 MG tablet Take 400 mg by mouth daily.    Marland Kitchen allopurinol (ZYLOPRIM) 300 MG tablet Take 300 mg by  mouth daily.    Marland Kitchen aspirin 81 MG tablet Take 81 mg by mouth daily.    . Calcium Citrate (CITRACAL PO) Take 4 tablets by mouth daily.     . cholecalciferol (VITAMIN D) 1000 UNITS tablet Take 4,000 Units by mouth daily.    . Estradiol (ELESTRIN) 0.52 MG/0.87 GM (0.06%) GEL Apply 2 application topically See admin instructions. 2 pumps thinly to upper arm daily at bedtime    . lidocaine-prilocaine (EMLA) cream Apply a nickel size amount to skin on top of port- cover with saran wrap at least 1 hour before access 30 g 0  . prochlorperazine (COMPAZINE) 10 MG tablet Take 10 mg by mouth every 6 (six)  hours as needed for nausea or vomiting.    . progesterone (PROMETRIUM) 200 MG capsule Take 200 mg by mouth See admin instructions. on days 1-12 every other month    . traZODone (DESYREL) 50 MG tablet Take 50 mg by mouth at bedtime.     No current facility-administered medications for this visit.    OBJECTIVE: Middle-aged white woman who appears well Filed Vitals:   05/03/14 0800  BP: 112/54  Pulse: 70  Temp: 98.5 F (36.9 C)  Resp: 18   Filed Weights   05/03/14 0800  Weight: 116 lb 3.2 oz (52.708 kg)   Body mass index is 24.29 kg/(m^2).    ECOG FS: 1  Sclerae unicteric, pupils equal and reactive Oropharynx clear, good dentition No cervical or supraclavicular adenopathy, no axillary or inguinal adenopathy Lungs no rales or rhonchi Heart regular rate and rhythm Abd soft, nontender, positive bowel sounds MSK no focal spinal tenderness, no upper extremity lymphedema Neuro: nonfocal, well oriented, appropriate affect Breasts: Deferred    LAB RESULTS:  Lab Results  Component Value Date   WBC 33.2* 05/01/2014   NEUTROABS 4.3 05/01/2014   HGB 12.0 05/01/2014   HCT 37.9 05/01/2014   MCV 94.8 05/01/2014   PLT 136* 05/01/2014      Chemistry      Component Value Date/Time   NA 138 05/01/2014 1029   NA 139 02/28/2014 0525   K 3.9 05/01/2014 1029   K 3.6 02/28/2014 0525   CL 115* 02/28/2014 0525   CL 101 12/15/2011 1322   CO2 26 05/01/2014 1029   CO2 22 02/28/2014 0525   BUN 8.8 05/01/2014 1029   BUN 10 02/28/2014 0525   CREATININE 0.8 05/01/2014 1029   CREATININE 0.61 02/28/2014 0525      Component Value Date/Time   CALCIUM 8.9 05/01/2014 1029   CALCIUM 7.2* 02/28/2014 0525   ALKPHOS 100 05/01/2014 1029   ALKPHOS 121* 02/27/2014 1705   AST 35* 05/01/2014 1029   AST 142* 02/27/2014 1705   ALT 27 05/01/2014 1029   ALT 71* 02/27/2014 1705   BILITOT 0.49 05/01/2014 1029   BILITOT 0.2* 02/27/2014 1705    Results for Rachel [name] (MRN [id number]) as of  05/03/2014 08:37  Ref. Range 12/05/2013 11:24 12/26/2013 13:13 12/30/2013 13:25 01/09/2014 08:17 01/16/2014 09:06 01/24/2014 09:05 02/01/2014 13:50 02/09/2014 14:19 02/23/2014 13:44 03/03/2014 15:56 03/24/2014 08:38 04/03/2014 08:34 04/20/2014 12:43 05/01/2014 10:28  lymph# Latest Range: 0.9-3.3 10e3/uL 243.8 (H) 145.6 (H) 58.2 (H) 46.7 (H) 61.2 (H) 67.0 (H) 56.3 (H) 55.4 (H) 50.6 (H) 103.7 (H) 23.9 (H) 32.5 (H) 27.8 (H) 26.8 (H)    STUDIES: No results found.  ASSESSMENT: 64 y.o.  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 rigors 02/27/2014: received IV vancomycin and levaquin briefly, then levaquin po (pcn allergy)  PLAN: Rachel [name] completes her sixth cycle of bendamustine/rituximab today. Generally she has tolerated this well. The treatment has not completely normalized her absolute lymphocyte count and at least after the initial cycles there was a very rapid rise in the white cell count between treatments. For this reason I do not think we are going to have a long response to the treatment she has received, although certainly we have achieved a measure of control.  I think a very reasonable move at this point with be to start ibrutinib. I discussed the possible toxicities, side effects and complications of this agent with her today. I'm going to try to obtain this for her by the  time she sees me next which will be the first week in April. She can then started and tried out for a few weeks before her next trip which will be to [place] in May or late April. She has another trip incidentally just pain in July. If she tolerates it well and the plan would be to try that for at least several months and then assess where to go from there.  Wille Glaser is a good understanding of this plan. She agrees with it. She knows to call for any problems that may develop before her next visit here.  Chauncey Cruel, [place] [phone] 05/03/2014

## 2014-05-04 ENCOUNTER — Ambulatory Visit (HOSPITAL_BASED_OUTPATIENT_CLINIC_OR_DEPARTMENT_OTHER): Payer: Medicare Other

## 2014-05-04 DIAGNOSIS — Z5111 Encounter for antineoplastic chemotherapy: Secondary | ICD-10-CM | POA: Diagnosis not present

## 2014-05-04 DIAGNOSIS — C9110 Chronic lymphocytic leukemia of B-cell type not having achieved remission: Secondary | ICD-10-CM | POA: Diagnosis not present

## 2014-05-04 MED ORDER — HEPARIN SOD (PORK) LOCK FLUSH 100 UNIT/ML IV SOLN
500.0000 [IU] | Freq: Once | INTRAVENOUS | Status: AC | PRN
  Administered 2014-05-04: 500 [IU]
  Filled 2014-05-04: qty 5

## 2014-05-04 MED ORDER — SODIUM CHLORIDE 0.9 % IV SOLN
Freq: Once | INTRAVENOUS | Status: AC
  Administered 2014-05-04: 09:00:00 via INTRAVENOUS
  Filled 2014-05-04: qty 4

## 2014-05-04 MED ORDER — SODIUM CHLORIDE 0.9 % IV SOLN
Freq: Once | INTRAVENOUS | Status: AC
  Administered 2014-05-04: 09:00:00 via INTRAVENOUS

## 2014-05-04 MED ORDER — SODIUM CHLORIDE 0.9 % IV SOLN
90.0000 mg/m2 | Freq: Once | INTRAVENOUS | Status: AC
  Administered 2014-05-04: 135 mg via INTRAVENOUS
  Filled 2014-05-04: qty 1.5

## 2014-05-04 MED ORDER — SODIUM CHLORIDE 0.9 % IJ SOLN
10.0000 mL | INTRAMUSCULAR | Status: DC | PRN
  Administered 2014-05-04: 10 mL
  Filled 2014-05-04: qty 10

## 2014-05-04 NOTE — Patient Instructions (Signed)
[facility] Discharge Instructions for Patients Receiving Chemotherapy  Today you received the following chemotherapy agents: Treanda.  To help prevent nausea and vomiting after your treatment, we encourage you to take your nausea medication as prescribed.   If you develop nausea and vomiting that is not controlled by your nausea medication, call the clinic.   BELOW ARE SYMPTOMS THAT SHOULD BE REPORTED IMMEDIATELY:  *FEVER GREATER THAN 100.5 F  *CHILLS WITH OR WITHOUT FEVER  NAUSEA AND VOMITING THAT IS NOT CONTROLLED WITH YOUR NAUSEA MEDICATION  *UNUSUAL SHORTNESS OF BREATH  *UNUSUAL BRUISING OR BLEEDING  TENDERNESS IN MOUTH AND THROAT WITH OR WITHOUT PRESENCE OF ULCERS  *URINARY PROBLEMS  *BOWEL PROBLEMS  UNUSUAL RASH Items with * indicate a potential emergency and should be followed up as soon as possible.  Feel free to call the clinic you have any questions or concerns. The clinic phone number is [phone].    

## 2014-05-05 ENCOUNTER — Telehealth: Payer: Self-pay | Admitting: *Deleted

## 2014-05-05 NOTE — Telephone Encounter (Signed)
Aryelle called to state she has been approved for $25 copay ( per month ) for the ibrutinib " and this medication is $200 a pill ".  " I want Dr Jana Hakim to known and tell me to go ahead and buy it - because you cannot return it once I pick it up"  Alex understands MD is out of office today and above will be reviewed upon his return on Monday.  Pt can be reached at her home number of [phone].

## 2014-05-12 ENCOUNTER — Telehealth: Payer: Self-pay | Admitting: *Deleted

## 2014-05-12 NOTE — Telephone Encounter (Signed)
Patient reports heartburn and nausea.  Wants to know what to take for those.  She has compazine on hand.  Advised her to take the Compazine and the medication that she has for GERD.  She did not remember the name of it, but is on her way home to get it.  Advised her to call back if need be.

## 2014-05-19 ENCOUNTER — Other Ambulatory Visit: Payer: Self-pay | Admitting: Obstetrics

## 2014-05-26 ENCOUNTER — Other Ambulatory Visit (HOSPITAL_BASED_OUTPATIENT_CLINIC_OR_DEPARTMENT_OTHER): Payer: Medicare Other

## 2014-05-26 DIAGNOSIS — R509 Fever, unspecified: Secondary | ICD-10-CM

## 2014-05-26 DIAGNOSIS — C9192 Lymphoid leukemia, unspecified, in relapse: Secondary | ICD-10-CM

## 2014-05-26 DIAGNOSIS — C9112 Chronic lymphocytic leukemia of B-cell type in relapse: Secondary | ICD-10-CM

## 2014-05-26 DIAGNOSIS — R87612 Low grade squamous intraepithelial lesion on cytologic smear of cervix (LGSIL): Secondary | ICD-10-CM | POA: Diagnosis not present

## 2014-05-26 DIAGNOSIS — D630 Anemia in neoplastic disease: Secondary | ICD-10-CM | POA: Diagnosis not present

## 2014-05-26 DIAGNOSIS — C9110 Chronic lymphocytic leukemia of B-cell type not having achieved remission: Secondary | ICD-10-CM

## 2014-05-26 LAB — COMPREHENSIVE METABOLIC PANEL (CC13)
ALT: 31 U/L (ref 0–55)
AST: 33 U/L (ref 5–34)
Albumin: 3.7 g/dL (ref 3.5–5.0)
Alkaline Phosphatase: 83 U/L (ref 40–150)
Anion Gap: 10 mEq/L (ref 3–11)
BILIRUBIN TOTAL: 0.46 mg/dL (ref 0.20–1.20)
BUN: 9.6 mg/dL (ref 7.0–26.0)
CO2: 27 mEq/L (ref 22–29)
Calcium: 9.1 mg/dL (ref 8.4–10.4)
Chloride: 107 mEq/L (ref 98–109)
Creatinine: 0.8 mg/dL (ref 0.6–1.1)
EGFR: 82 mL/min/{1.73_m2} — ABNORMAL LOW (ref >90)
Glucose: 85 mg/dl (ref 70–140)
Potassium: 4.2 mEq/L (ref 3.5–5.1)
Sodium: 143 mEq/L (ref 136–145)
Total Protein: 6.1 g/dL — ABNORMAL LOW (ref 6.4–8.3)

## 2014-05-26 LAB — CBC WITH DIFFERENTIAL/PLATELET
BASO%: 0.5 % (ref 0.0–2.0)
Basophils Absolute: 0.1 10*3/uL (ref 0.0–0.1)
EOS%: 1.9 % (ref 0.0–7.0)
Eosinophils Absolute: 0.4 10*3/uL (ref 0.0–0.5)
HCT: 37.4 % (ref 34.8–46.6)
HGB: 11.7 g/dL (ref 11.6–15.9)
LYMPH%: 87 % — AB (ref 14.0–49.7)
MCH: 29.6 pg (ref 25.1–34.0)
MCHC: 31.4 g/dL — AB (ref 31.5–36.0)
MCV: 94.2 fL (ref 79.5–101.0)
MONO#: 1 10*3/uL — ABNORMAL HIGH (ref 0.1–0.9)
MONO%: 4.5 % (ref 0.0–14.0)
NEUT#: 1.4 10*3/uL — ABNORMAL LOW (ref 1.5–6.5)
NEUT%: 6.1 % — ABNORMAL LOW (ref 38.4–76.8)
PLATELETS: 124 10*3/uL — AB (ref 145–400)
RBC: 3.97 10*6/uL (ref 3.70–5.45)
RDW: 13.1 % (ref 11.2–14.5)
WBC: 23.1 10*3/uL — ABNORMAL HIGH (ref 3.9–10.3)
lymph#: 20.1 10*3/uL — ABNORMAL HIGH (ref 0.9–3.3)

## 2014-05-26 LAB — LACTATE DEHYDROGENASE (CC13): LDH: 207 U/L (ref 125–245)

## 2014-05-26 LAB — TECHNOLOGIST REVIEW

## 2014-05-29 ENCOUNTER — Telehealth: Payer: Self-pay | Admitting: Hematology and Oncology

## 2014-05-29 ENCOUNTER — Ambulatory Visit (HOSPITAL_BASED_OUTPATIENT_CLINIC_OR_DEPARTMENT_OTHER): Payer: Medicare Other | Admitting: Oncology

## 2014-05-29 VITALS — BP 101/82 | HR 74 | Temp 98.0°F | Resp 18 | Ht <= 58 in | Wt 116.3 lb

## 2014-05-29 DIAGNOSIS — C9110 Chronic lymphocytic leukemia of B-cell type not having achieved remission: Secondary | ICD-10-CM | POA: Diagnosis not present

## 2014-05-29 DIAGNOSIS — D630 Anemia in neoplastic disease: Secondary | ICD-10-CM

## 2014-05-29 MED ORDER — ACYCLOVIR 400 MG PO TABS: 400.0000 mg | ORAL_TABLET | Freq: Two times a day (BID) | ORAL | Status: DC

## 2014-05-29 NOTE — Progress Notes (Signed)
ID: [name]   DOB: 1950/06/24  MR#: [id number]  PPI#:[id number]  PCP: [name] Md GYN: SU:  OTHER MD: [name] M.D.  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 She has been treated with Rituxan, ofatumumab, cladribine, bendamustine and currently with ibrutinib, with details summarized below.  INTERVAL HISTORY: Rachel [name] returns today for follow up of her chronic lymphoid leukemia accompanied by her husband Rachel [name]. She has been on ibrutinib now approximately 4 weeks. She is tolerating it with no side effects whatsoever that she is aware of. She is obtaining at 4 $24, three-month supply  REVIEW OF SYSTEMS: She has problems with warts, and she understands that because her immune system is not working well at this really will need dermatologic follow-up. In other words towards may be harder to control than usual. She feels chilly. This could be possibly related to her medication. She has night sweats and hot flashes which are no different from before. She complains of insomnia, blurred vision, and significant sinus problems which she usually feels around this time of year secondary to the pollen. She also has food allergies, which causes her some mucus and this occasionally causes her to vomit, perhaps once a month or so. She continues to have heartburn. She has some stress urinary incontinence. She feels forgetful, anxious, and depressed. She has multiple bowel movements a day or this is her normal pattern. Overall a detailed review of systems today was entirely stable  PAST MEDICAL HISTORY: Past Medical History  Diagnosis Date  . Cancer   . Leukemia   . Lower back pain     PAST SURGICAL HISTORY: Past Surgical History  Procedure Laterality Date  . Tonsillectomy    . Other surgical history       removal of uterine lining  . Lining of uterus removed    . Bunionectomy      right foot  . Breast surgery  2015    left breast bx    FAMILY HISTORY Family History  Problem Relation Age of Onset  . Depression Son     GYNECOLOGIC HISTORY: Menarche age 68, first live birth age 22, she is Lamar P3. She stopped having periods in 1999 after endometrial stripping. She continues on hormone replacement.  SOCIAL HISTORY: Her husband Rachel [name] is retired from [employer]. Her son Rachel [name] lives in [place] and has 3 sons; he works for a Pharmacist, community. A second son lives in [place] and has two children. Son Rachel [name] lives in [place] and has a daughter, as well as 2 sons by marriage.   ADVANCED DIRECTIVES: In place  HEALTH MAINTENANCE: History  Substance Use Topics  . Smoking status: Never Smoker   . Smokeless tobacco: Never Used  . Alcohol Use: Yes     Comment: socially     Colonoscopy: due  PAP: December 2012/ [name]  Bone density: Due  Lipid panel: per Dr Drema Dallas  Allergies  Allergen Reactions  . Penicillins Anaphylaxis    "stopped breathing" "drops my heartbeat" "I just pass out"    Current Outpatient Prescriptions  Medication Sig Dispense Refill  . acetaminophen (TYLENOL) 500 MG tablet Take 500 mg by mouth every 6 (six) hours as needed for headache.    Marland Kitchen acyclovir (ZOVIRAX) 400 MG tablet Take 400 mg by mouth daily.    Marland Kitchen allopurinol (ZYLOPRIM) 300 MG tablet Take 300  mg by mouth daily.    Marland Kitchen aspirin 81 MG tablet Take 81 mg by mouth daily.    . Calcium Citrate (CITRACAL PO) Take 4 tablets by mouth daily.     . cholecalciferol (VITAMIN D) 1000 UNITS tablet Take 4,000 Units by mouth daily.    . Estradiol (ELESTRIN) 0.52 MG/0.87 GM (0.06%) GEL Apply 2 application topically See admin instructions. 2 pumps thinly to upper arm daily at bedtime    . ibrutinib (IMBRUVICA) 140 MG capsul Take 3 capsules (420 mg total) by mouth daily. 90 capsule 6  . lidocaine-prilocaine (EMLA) cream  Apply a nickel size amount to skin on top of port- cover with saran wrap at least 1 hour before access 30 g 0  . prochlorperazine (COMPAZINE) 10 MG tablet Take 10 mg by mouth every 6 (six) hours as needed for nausea or vomiting.    . progesterone (PROMETRIUM) 200 MG capsule Take 200 mg by mouth See admin instructions. on days 1-12 every other month    . progesterone (PROMETRIUM) 200 MG capsule TAKE 1 CAPSULE ON DAYS 1 THROUGH 12 EVERY OTHER MONTH 24 capsule 2  . traZODone (DESYREL) 50 MG tablet Take 50 mg by mouth at bedtime.     No current facility-administered medications for this visit.    OBJECTIVE: Middle-aged white woman  Filed Vitals:   05/29/14 1221  BP: 101/82  Pulse: 74  Temp: 98 F (36.7 C)  Resp: 18   Filed Weights   05/29/14 1221  Weight: 116 lb 4.8 oz (52.753 kg)   Body mass index is 24.31 kg/(m^2).    ECOG FS: 1  Sclerae unicteric, pupils round and equal Oropharynx clear, teeth in good repair No cervical or supraclavicular adenopathy, no axillary or inguinal adenopathy Lungs no rales or rhonchi Heart regular rate and rhythm Abd soft, nontender, positive bowel sounds, no masses palpated MSK no focal spinal tenderness, no upper extremity lymphedema Neuro: nonfocal, well oriented, appropriate affect Breasts: Deferred    LAB RESULTS:  Lab Results  Component Value Date   WBC 23.1* 05/26/2014   NEUTROABS 1.4* 05/26/2014   HGB 11.7 05/26/2014   HCT 37.4 05/26/2014   MCV 94.2 05/26/2014   PLT 124* 05/26/2014      Chemistry      Component Value Date/Time   NA 143 05/26/2014 1125   NA 139 02/28/2014 0525   K 4.2 05/26/2014 1125   K 3.6 02/28/2014 0525   CL 115* 02/28/2014 0525   CL 101 12/15/2011 1322   CO2 27 05/26/2014 1125   CO2 22 02/28/2014 0525   BUN 9.6 05/26/2014 1125   BUN 10 02/28/2014 0525   CREATININE 0.8 05/26/2014 1125   CREATININE 0.61 02/28/2014 0525      Component Value Date/Time   CALCIUM 9.1 05/26/2014 1125   CALCIUM 7.2*  02/28/2014 0525   ALKPHOS 83 05/26/2014 1125   ALKPHOS 121* 02/27/2014 1705   AST 33 05/26/2014 1125   AST 142* 02/27/2014 1705   ALT 31 05/26/2014 1125   ALT 71* 02/27/2014 1705   BILITOT 0.46 05/26/2014 1125   BILITOT 0.2* 02/27/2014 1705    Results for ANHAR, MCDERMOTT A (MRN [id number]) as of 05/29/2014 13:12  Ref. Range 03/24/2014 08:38 04/03/2014 08:34 04/20/2014 12:43 05/01/2014 10:28 05/26/2014 11:21  lymph# Latest Range: 0.9-3.3 10e3/uL 23.9 (H) 32.5 (H) 27.8 (H) 26.8 (H) 20.1 (H)   STUDIES: No results found.   ASSESSMENT: 64 y.o.  [place] woman with a history of chronic lymphoid leukemia/well-differentiated lymphocytic  lymphoma dating back to January of 2002 treated with Rituxan in 2004 and 2008 and January/February 2012  (1) ofatumumab started 12/10/2011, with a complete remission not obtained after the first 8 weekly treatments; an additional 4 monthly treatments completed 06/08/2012  (2) cladribine x6 completed 04/06/2012  (3) started bendamustine/ rituximab 12/12/2013,  repeated every 28 days for 4-6 cycles, completed 05/04/2014  (a) allopurinol and acyclovir started OCT 2015   (4) anemia: B-12 and folate WNL 02/28/2014; ferritin 48; absolute retic count 41.3, nl MCV  (5) reaction to rituximab vs. rigors 02/27/2014: received IV vancomycin and levaquin briefly, then levaquin po (pcn allergy)  (6) bendamustine/ rituximab for 6 cycles, completed 05/04/2014, with improvement but not normalization of the absolute lymphocyte count  (7) ibrutinib started 05/08/2014  PLAN: Rachel [name] is tolerating the ibrutinib with no side effects that she is aware of. She is obtaining it at a very good price. Her LDH has normalized and her absolute lymphocyte count has continued to drop.  She understands this is a medication that is generally continued indefinitely, or until there is evidence of disease progression. In fact there are some reports that stopping the medication may lead to a "rebound" in  the lymphocyte count. For that reason it is sometimes suggested that a new treatment be started before ibrutinib is discontinued  At any rate our plan is simply to continue ibrutinib indefinitely so long as she is tolerating it well and it continues to work.  She is going to keep her port. It needs to be flushed April 21. She will be in early June but she will be in [facility] at that time. Accordingly I wrote her a prescription requesting a CBC to be drawn from the port with port flush and then the results faxed to me. The next flush after that we'll be the first week in July, and she will see me on that date, just before they leave for another trip to [place].  Rachel [name] is a very good understanding of this plan. She agrees with it. She knows the goal of treatment in her case is control. She will call with any problems that may develop before her next visit here.   Chauncey Cruel, [place] [phone] 05/29/2014

## 2014-05-29 NOTE — Telephone Encounter (Signed)
per pof to sch pt appt-gave pt copy of sch °

## 2014-06-02 ENCOUNTER — Telehealth: Payer: Self-pay | Admitting: *Deleted

## 2014-06-02 DIAGNOSIS — J309 Allergic rhinitis, unspecified: Secondary | ICD-10-CM | POA: Diagnosis not present

## 2014-06-02 NOTE — Telephone Encounter (Signed)
TC from [place] regarding upcoming trip to [place], [place]: 1. Will be getting labs/PAC flush while there-has prescription for this but cancer center there is treating her like new pt and want her medical records, referral for them. 2. Also needs note from MD to excuse her from her exercise gym as they will charge her fees even if not going at this time.

## 2014-06-02 NOTE — Telephone Encounter (Signed)
[facility] [place] for information of [place] in [place], [place]. Please fax medical records to : [facility]; Attention: Buena Irish # [phone] Routed to Medical Records, Dr. Jana Hakim and nurse. Referral can go to same fax #

## 2014-06-05 ENCOUNTER — Other Ambulatory Visit: Payer: Self-pay | Admitting: *Deleted

## 2014-06-05 ENCOUNTER — Encounter: Payer: Self-pay | Admitting: *Deleted

## 2014-06-05 ENCOUNTER — Telehealth: Payer: Self-pay | Admitting: *Deleted

## 2014-06-05 NOTE — Telephone Encounter (Signed)
This RN faxed records to [facility].  Per contact with pt letter for hold on gym membership faxed to [phone].

## 2014-06-08 ENCOUNTER — Telehealth: Payer: Self-pay | Admitting: *Deleted

## 2014-06-08 NOTE — Telephone Encounter (Signed)
"  Imbruvica started mid March and I've started having side effects I want Mateo Flow to know about.  I've read the [facility] drug fact sheet.  Monday I started having pain.  And today I started having eye problems.  I did Zumba exercise today, I do YOGA three times a week.  My body hurts all over.  Everywhere I touch hurts. It's a weird feeling.  Feels like a pain is going down left side and it hurts quite a lot.  Port-a-cath is on the right side.  I have dry eyes and use eye drops but today I feel like there's debris in my eyes.  Peripheral vision is blurry.  My eyes are tearing all this at the same time."  Denies seasonal allergies yet reports "Saw doctor and started allegra a week ago because of vertigo for no reason.  I have fluid in my ears.  I have post nasal nasal drip."  Instructed to avoid salt but says she tries to eat more salt because B/P runs low all the time."  Takes Imbruvica daily at lunch time.  Denies eating grapefruit.  Will notify Provider of this call.  Return number [phone].  Next F/U is 08-31-2014.              This nure advised her to modify her exercise as fatigue and pain are side effects.

## 2014-06-12 NOTE — Telephone Encounter (Signed)
This RN spoke with pt today per her call regarding side effects of Imbruvica.  Rachel [name] states some of the symptoms discussed last week have lessened " but now I have dandruff and I have not had that for like 20 years.  " my right jaw feels sore especially when I chew "  This RN discussed side effects and management to continue the Imbruvica.  Discussed use of clariten vs allegra- may work better.  Rachel [name] will follow up with dentist per jaw- and possible TMJ concerns.  Post above discussion Rachel [name] feels comfortable continuing on Imbruvica and will call if additional concerns arise.

## 2014-06-15 ENCOUNTER — Other Ambulatory Visit (HOSPITAL_BASED_OUTPATIENT_CLINIC_OR_DEPARTMENT_OTHER): Payer: Medicare Other

## 2014-06-15 ENCOUNTER — Ambulatory Visit (HOSPITAL_BASED_OUTPATIENT_CLINIC_OR_DEPARTMENT_OTHER): Payer: Medicare Other

## 2014-06-15 VITALS — BP 96/61 | HR 74 | Temp 98.5°F

## 2014-06-15 DIAGNOSIS — C9192 Lymphoid leukemia, unspecified, in relapse: Secondary | ICD-10-CM

## 2014-06-15 DIAGNOSIS — D630 Anemia in neoplastic disease: Secondary | ICD-10-CM

## 2014-06-15 DIAGNOSIS — Z95828 Presence of other vascular implants and grafts: Secondary | ICD-10-CM

## 2014-06-15 DIAGNOSIS — Z452 Encounter for adjustment and management of vascular access device: Secondary | ICD-10-CM

## 2014-06-15 DIAGNOSIS — C9112 Chronic lymphocytic leukemia of B-cell type in relapse: Secondary | ICD-10-CM

## 2014-06-15 DIAGNOSIS — R509 Fever, unspecified: Secondary | ICD-10-CM

## 2014-06-15 DIAGNOSIS — R87612 Low grade squamous intraepithelial lesion on cytologic smear of cervix (LGSIL): Secondary | ICD-10-CM

## 2014-06-15 DIAGNOSIS — C9110 Chronic lymphocytic leukemia of B-cell type not having achieved remission: Secondary | ICD-10-CM

## 2014-06-15 LAB — COMPREHENSIVE METABOLIC PANEL (CC13)
ALBUMIN: 3.4 g/dL — AB (ref 3.5–5.0)
ALK PHOS: 82 U/L (ref 40–150)
ALT: 37 U/L (ref 0–55)
AST: 35 U/L — AB (ref 5–34)
Anion Gap: 12 mEq/L — ABNORMAL HIGH (ref 3–11)
BILIRUBIN TOTAL: 0.23 mg/dL (ref 0.20–1.20)
BUN: 10.2 mg/dL (ref 7.0–26.0)
CO2: 22 meq/L (ref 22–29)
CREATININE: 0.7 mg/dL (ref 0.6–1.1)
Calcium: 8.8 mg/dL (ref 8.4–10.4)
Chloride: 106 mEq/L (ref 98–109)
EGFR: 88 mL/min/{1.73_m2} — AB (ref >90)
Glucose: 110 mg/dl (ref 70–140)
Potassium: 4 mEq/L (ref 3.5–5.1)
SODIUM: 140 meq/L (ref 136–145)
Total Protein: 5.6 g/dL — ABNORMAL LOW (ref 6.4–8.3)

## 2014-06-15 LAB — CBC WITH DIFFERENTIAL/PLATELET
BASO%: 0.4 % (ref 0.0–2.0)
BASOS ABS: 0.1 10*3/uL (ref 0.0–0.1)
EOS ABS: 0.3 10*3/uL (ref 0.0–0.5)
EOS%: 1.9 % (ref 0.0–7.0)
HEMATOCRIT: 35.3 % (ref 34.8–46.6)
HEMOGLOBIN: 11.6 g/dL (ref 11.6–15.9)
LYMPH#: 12.3 10*3/uL — AB (ref 0.9–3.3)
LYMPH%: 78.6 % — ABNORMAL HIGH (ref 14.0–49.7)
MCH: 30.8 pg (ref 25.1–34.0)
MCHC: 32.7 g/dL (ref 31.5–36.0)
MCV: 94.1 fL (ref 79.5–101.0)
MONO#: 0.8 10*3/uL (ref 0.1–0.9)
MONO%: 5 % (ref 0.0–14.0)
NEUT%: 14.1 % — AB (ref 38.4–76.8)
NEUTROS ABS: 2.2 10*3/uL (ref 1.5–6.5)
Platelets: 127 10*3/uL — ABNORMAL LOW (ref 145–400)
RBC: 3.75 10*6/uL (ref 3.70–5.45)
RDW: 13.1 % (ref 11.2–14.5)
WBC: 15.6 10*3/uL — ABNORMAL HIGH (ref 3.9–10.3)

## 2014-06-15 LAB — TECHNOLOGIST REVIEW

## 2014-06-15 LAB — LACTATE DEHYDROGENASE (CC13): LDH: 202 U/L (ref 125–245)

## 2014-06-15 MED ORDER — SODIUM CHLORIDE 0.9 % IJ SOLN
10.0000 mL | INTRAMUSCULAR | Status: DC | PRN
  Administered 2014-06-15: 10 mL via INTRAVENOUS
  Filled 2014-06-15: qty 10

## 2014-06-15 MED ORDER — HEPARIN SOD (PORK) LOCK FLUSH 100 UNIT/ML IV SOLN
500.0000 [IU] | Freq: Once | INTRAVENOUS | Status: AC
  Administered 2014-06-15: 500 [IU] via INTRAVENOUS
  Filled 2014-06-15: qty 5

## 2014-06-15 NOTE — Patient Instructions (Signed)

## 2014-06-25 DIAGNOSIS — R531 Weakness: Secondary | ICD-10-CM | POA: Diagnosis not present

## 2014-06-25 DIAGNOSIS — R5383 Other fatigue: Secondary | ICD-10-CM | POA: Diagnosis not present

## 2014-06-25 DIAGNOSIS — D649 Anemia, unspecified: Secondary | ICD-10-CM | POA: Diagnosis not present

## 2014-06-25 DIAGNOSIS — D696 Thrombocytopenia, unspecified: Secondary | ICD-10-CM | POA: Diagnosis not present

## 2014-06-25 DIAGNOSIS — M7989 Other specified soft tissue disorders: Secondary | ICD-10-CM | POA: Diagnosis not present

## 2014-06-25 DIAGNOSIS — C9110 Chronic lymphocytic leukemia of B-cell type not having achieved remission: Secondary | ICD-10-CM | POA: Diagnosis not present

## 2014-06-26 ENCOUNTER — Telehealth: Payer: Self-pay

## 2014-06-26 DIAGNOSIS — M7989 Other specified soft tissue disorders: Secondary | ICD-10-CM | POA: Diagnosis not present

## 2014-06-26 NOTE — Telephone Encounter (Signed)
Call report rcvd from [facility].  Pt out of town, experiencing h/a, chills, joint pain, bruises.  LMOVM - pt to return call to clinic.

## 2014-06-27 ENCOUNTER — Telehealth: Payer: Self-pay

## 2014-06-27 NOTE — Telephone Encounter (Signed)
Patient reports she went to ED over weekend.  She is borderline anemic and concerned about the bruising.  Her WBC 22.1 - she is concerned about this number.  She said she was going to email the lab results for Dr. Jana Hakim.  Have not received as of 5/3 1730.    Routed to Dr Jana Hakim and Marlon Pel

## 2014-06-28 NOTE — Telephone Encounter (Signed)
Charting error.

## 2014-07-06 ENCOUNTER — Telehealth: Payer: Self-pay | Admitting: *Deleted

## 2014-07-06 ENCOUNTER — Telehealth: Payer: Self-pay | Admitting: Oncology

## 2014-07-06 NOTE — Telephone Encounter (Signed)
Thank you :)

## 2014-07-06 NOTE — Telephone Encounter (Signed)
Faxed pt medical records to [facility]

## 2014-07-06 NOTE — Telephone Encounter (Signed)
Patient called requesting status of records being sent to [facility] in [place].  I've called and they do not have records so the availability for labs on July 27, 2014 is no longer.  They need at least a years worth of records.  These results effect what I do in [place] in July.  I plan to be out of the country September 01, 2014 through September 29, 2014."  This nurse confirmed fax number for [facility] [phone] with [facility].  Spoke with Charlie Pitter in [facility] who will send records today.

## 2014-07-24 DIAGNOSIS — H66001 Acute suppurative otitis media without spontaneous rupture of ear drum, right ear: Secondary | ICD-10-CM | POA: Diagnosis not present

## 2014-07-25 ENCOUNTER — Other Ambulatory Visit: Payer: Self-pay | Admitting: *Deleted

## 2014-07-25 DIAGNOSIS — C9110 Chronic lymphocytic leukemia of B-cell type not having achieved remission: Secondary | ICD-10-CM

## 2014-07-25 MED ORDER — IBRUTINIB 140 MG PO CAPS: 420.0000 mg | ORAL_CAPSULE | Freq: Every day | ORAL | Status: DC

## 2014-07-25 NOTE — Telephone Encounter (Signed)
Voicemail from patient asking for Imbruvica to be sent to [facility].  "I am in [place].  [facility] can't ship it here so I need order sent to [facility].  My address here is [address] [place], [place].  Mobile number [phone].  I have about 12 to 14 pills left and will be here until the end of June."  Order sent to [facility] per patient's request.

## 2014-07-26 ENCOUNTER — Other Ambulatory Visit: Payer: Self-pay | Admitting: Obstetrics

## 2014-07-28 ENCOUNTER — Telehealth: Payer: Self-pay | Admitting: *Deleted

## 2014-07-28 NOTE — Telephone Encounter (Signed)
PT. STATES SHE TAKES ONE CAPSULE DAILY. 07/25/14 PRESCRIPTION WRITTEN FOR THREE CAPSULES DAILY.

## 2014-07-28 NOTE — Telephone Encounter (Signed)
Call patient re: message below.  Pt states her understanding, and hse has been taking as such, is one pill once a day.  I clarified to patient that the prescription is for 3 pills once a day.  Pt is concerned about increasing the dose - states she is already having side effects from one pill.  Advised the patient that in order for the medication to have therapeutic effect, she needs to take the dose as prescribed.  She wants to discuss with Dr. Jana Hakim.  Pt is concerned about going out of country on a higher dose.  Pt reports she is leaving 7/7 - let pt know that would give her a month on the full dose to assess its effect on her before leaving the [place].      States she does not want to take any more chemotherapy, that she is tired and her "body is tired".  States she is considering going to [place] to see a cancer doctor.  Pt wonders what other options are available for her.  Pt reports she is seeking a 2nd opinion at [facility].    I suggested patient start full dose today so she can assess what effects she may have over the weekend and then discuss with Dr. Jana Hakim next week.  Pt agreed to this.  I let patient know I would let Dr. Jana Hakim know her concerns and her request to speak with him.  Pt voiced understanding.

## 2014-08-01 ENCOUNTER — Other Ambulatory Visit: Payer: Self-pay | Admitting: Oncology

## 2014-08-01 NOTE — Progress Notes (Unsigned)
I called Rachel [name] to make sure she was taking the ibrutinib correctly. She now understands the correct dose. She is waiting for the new shipment to restart. She has had a stye in her eye and some allergy problems in [facility]. She went to urgent care and the burn a biotics which has helped. She is not cleared up yet however.  She is planning to seek a second opinion in [facility] but they have not "accepted her" yet, she says. We have already sent them all information we have.  She will see me early July. She knows to call for any problems that may develop before then.

## 2014-08-02 ENCOUNTER — Ambulatory Visit: Payer: Medicare Other | Admitting: Obstetrics

## 2014-08-03 ENCOUNTER — Telehealth: Payer: Self-pay | Admitting: *Deleted

## 2014-08-03 NOTE — Telephone Encounter (Signed)
PT. STATES THE LETTER SAID [facility] WILL NOT BE ABLE TO FILL PT.'S IMBRUVICA PRESCRIPTION. SHE WANTED THE PRESCRIPTION CALLED TO [facility] IN [place].EXPLAINED TO PT. THAT SHE MAY HAVE TO PAY OUT OF POCKET FOR THE VERY EXPENSIVE IMBRUVICA. ENCOURAGED PT. TO CONTACT [facility] AND ASK WHY HER MEDICATION COULD NOT BE FILLED BY [facility].

## 2014-08-04 DIAGNOSIS — Z79899 Other long term (current) drug therapy: Secondary | ICD-10-CM | POA: Diagnosis not present

## 2014-08-04 DIAGNOSIS — C9110 Chronic lymphocytic leukemia of B-cell type not having achieved remission: Secondary | ICD-10-CM | POA: Diagnosis not present

## 2014-08-04 DIAGNOSIS — R5383 Other fatigue: Secondary | ICD-10-CM | POA: Diagnosis not present

## 2014-08-04 DIAGNOSIS — Z7189 Other specified counseling: Secondary | ICD-10-CM | POA: Diagnosis not present

## 2014-08-05 DIAGNOSIS — R42 Dizziness and giddiness: Secondary | ICD-10-CM | POA: Diagnosis not present

## 2014-08-05 DIAGNOSIS — Z743 Need for continuous supervision: Secondary | ICD-10-CM | POA: Diagnosis not present

## 2014-08-05 DIAGNOSIS — R531 Weakness: Secondary | ICD-10-CM | POA: Diagnosis not present

## 2014-08-05 DIAGNOSIS — F419 Anxiety disorder, unspecified: Secondary | ICD-10-CM | POA: Diagnosis not present

## 2014-08-05 DIAGNOSIS — I959 Hypotension, unspecified: Secondary | ICD-10-CM | POA: Diagnosis not present

## 2014-08-05 DIAGNOSIS — R079 Chest pain, unspecified: Secondary | ICD-10-CM | POA: Diagnosis not present

## 2014-08-07 ENCOUNTER — Telehealth: Payer: Self-pay | Admitting: *Deleted

## 2014-08-07 NOTE — Telephone Encounter (Signed)
Pt called from [facility]. States she has been dizzy and weak. Went to ED on Saturday, they said she was dehydrated. Pt states she is out of her Rachel [name], paid for overnight delivery on Friday, but still has not received it. Delivery expected on Tuesday. She has spoken with her oncologist in [facility] and they are trying to get her some Imbruvica. State she is feeling dizzy again, but has been drinking fluids as instructed. Pt encouraged to call her oncologist in [facility] again to let them know how she is feeling. Pt states she will call them, does not want to go back to the ED.

## 2014-08-14 ENCOUNTER — Telehealth: Payer: Self-pay | Admitting: *Deleted

## 2014-08-14 MED ORDER — ALLOPURINOL 300 MG PO TABS: 300.0000 mg | ORAL_TABLET | Freq: Every day | ORAL | Status: DC

## 2014-08-15 ENCOUNTER — Other Ambulatory Visit: Payer: Self-pay | Admitting: *Deleted

## 2014-08-15 ENCOUNTER — Telehealth: Payer: Self-pay | Admitting: *Deleted

## 2014-08-15 ENCOUNTER — Other Ambulatory Visit: Payer: Self-pay | Admitting: Oncology

## 2014-08-15 DIAGNOSIS — C9110 Chronic lymphocytic leukemia of B-cell type not having achieved remission: Secondary | ICD-10-CM

## 2014-08-15 MED ORDER — IBRUTINIB 140 MG PO CAPS: 420.0000 mg | ORAL_CAPSULE | Freq: Every day | ORAL | Status: DC

## 2014-08-15 NOTE — Telephone Encounter (Signed)
VM message from patient requesting a refill on her IBrance from [place]. See note from 07/28/14 in addition.

## 2014-08-15 NOTE — Telephone Encounter (Signed)
Ms Yeakle is on ibrutinib, not Ibrance  Please call in a refill for her ibrutinib (reorder current script)  Thanks!  GM

## 2014-08-15 NOTE — Telephone Encounter (Signed)
REFILL COMPLETED. 

## 2014-08-15 NOTE — Telephone Encounter (Signed)
Done

## 2014-08-15 NOTE — Telephone Encounter (Signed)
Refill for Ibrutinib escribed to [facility]

## 2014-08-21 ENCOUNTER — Telehealth: Payer: Self-pay | Admitting: *Deleted

## 2014-08-21 NOTE — Telephone Encounter (Signed)
-----   Message from Jarvis Morgan, RN sent at 08/21/2014  9:53 AM EDT ----- Regarding: Wanting to talk to desk nurse only Contact: [phone] Rachel [name], Rachel [name] called and left message in triage requesting to talk to desk nurse and/or Dr. Jana Hakim only. Please call pt at phone listed above. Thu.

## 2014-08-21 NOTE — Telephone Encounter (Signed)
This RN spoke with pt per return call post discussion with MD and her concerns taking 3 tablets a day of the [place].  Note Rachel [name] states she " has a lot of bruising, feels dizzy, my mouth is very dry and just do not feel good "  Pt states she " thought I was only supposed to take one tablet a day and my sister says she has a picture of the note where Dr Jana Hakim wrote one tablet a day "  This RN discussed above with pt including 3 tablets a day is the suggested theraputic dose - MD note may be reflecting dose not actual tablets. This RN also informed pt that MD is ok with her going to one tablet a day until her scheduled appt with MD next week.

## 2014-08-29 ENCOUNTER — Ambulatory Visit (INDEPENDENT_AMBULATORY_CARE_PROVIDER_SITE_OTHER): Payer: Medicare Other | Admitting: Obstetrics

## 2014-08-29 VITALS — BP 130/78 | Wt 118.0 lb

## 2014-08-29 DIAGNOSIS — R87610 Atypical squamous cells of undetermined significance on cytologic smear of cervix (ASC-US): Secondary | ICD-10-CM | POA: Diagnosis not present

## 2014-08-29 DIAGNOSIS — R87612 Low grade squamous intraepithelial lesion on cytologic smear of cervix (LGSIL): Secondary | ICD-10-CM | POA: Diagnosis not present

## 2014-08-30 ENCOUNTER — Encounter: Payer: Self-pay | Admitting: Obstetrics

## 2014-08-30 NOTE — Progress Notes (Signed)
Patient ID: Rachel [name], female   DOB: November 16, 1950, 64 y.o.   MRN: [id number]  Chief Complaint  Patient presents with  . Follow-up    repeat pap    HPI Rachel [name] is a 64 y.o. female.  Presents for F/U pap smear for LGSIL.  HPI  Past Medical History  Diagnosis Date  . Cancer   . Leukemia   . Lower back pain     Past Surgical History  Procedure Laterality Date  . Tonsillectomy    . Other surgical history      removal of uterine lining  . Lining of uterus removed    . Bunionectomy      right foot  . Breast surgery  2015    left breast bx    Family History  Problem Relation Age of Onset  . Depression Son     Social History History  Substance Use Topics  . Smoking status: Never Smoker   . Smokeless tobacco: Never Used  . Alcohol Use: Yes     Comment: socially    Allergies  Allergen Reactions  . Penicillins Anaphylaxis    "stopped breathing" "drops my heartbeat" "I just pass out"    Current Outpatient Prescriptions  Medication Sig Dispense Refill  . acyclovir (ZOVIRAX) 400 MG tablet Take 1 tablet (400 mg total) by mouth 2 (two) times daily. 180 tablet 4  . allopurinol (ZYLOPRIM) 300 MG tablet Take 1 tablet (300 mg total) by mouth daily. 90 tablet 0  . aspirin 81 MG tablet Take 81 mg by mouth daily.    . Calcium Citrate (CITRACAL PO) Take 4 tablets by mouth daily.     . cholecalciferol (VITAMIN D) 1000 UNITS tablet Take 4,000 Units by mouth daily.    Marland Kitchen ELESTRIN 0.52 MG/0.87 GM (0.06%) GEL APPLY 2 PUMPS THINLY TO UPPER ARM DAILY AT BEDTIME 156 g 3  . ibrutinib (IMBRUVICA) 140 MG capsul Take 3 capsules (420 mg total) by mouth daily. 90 capsule 6  . progesterone (PROMETRIUM) 200 MG capsule Take 200 mg by mouth See admin instructions. on days 1-12 every other month    . traZODone (DESYREL) 50 MG tablet Take 50 mg by mouth at bedtime.    Marland Kitchen acetaminophen (TYLENOL) 500 MG tablet Take 500 mg by mouth every 6 (six) hours as needed for headache.    .  lidocaine-prilocaine (EMLA) cream Apply a nickel size amount to skin on top of port- cover with saran wrap at least 1 hour before access 30 g 0  . prochlorperazine (COMPAZINE) 10 MG tablet Take 10 mg by mouth every 6 (six) hours as needed for nausea or vomiting.    . progesterone (PROMETRIUM) 200 MG capsule TAKE 1 CAPSULE ON DAYS 1 THROUGH 12 EVERY OTHER MONTH 24 capsule 2   No current facility-administered medications for this visit.    Review of Systems Review of Systems Constitutional: negative for fatigue and weight loss Respiratory: negative for cough and wheezing Cardiovascular: negative for chest pain, fatigue and palpitations Gastrointestinal: negative for abdominal pain and change in bowel habits Genitourinary:negative Integument/breast: negative for nipple discharge Musculoskeletal:negative for myalgias Neurological: negative for gait problems and tremors Behavioral/Psych: negative for abusive relationship, depression Endocrine: negative for temperature intolerance     Blood pressure 130/78, weight 118 lb (53.524 kg).  Physical Exam Physical Exam                 General:  Alert, no distress. Pelvis:  External genitalia: normal general appearance  Urinary system: urethral meatus normal and bladder without fullness, nontender Vaginal: normal without tenderness, induration or masses Cervix: normal appearance Adnexa: normal bimanual exam Uterus: anteverted and non-tender, normal size      Data Reviewed Pathology  Assessment     LGSIL     Plan    Repeat pap 6 months  No orders of the defined types were placed in this encounter.   No orders of the defined types were placed in this encounter.

## 2014-08-31 ENCOUNTER — Ambulatory Visit (HOSPITAL_BASED_OUTPATIENT_CLINIC_OR_DEPARTMENT_OTHER): Payer: Medicare Other

## 2014-08-31 ENCOUNTER — Other Ambulatory Visit (HOSPITAL_BASED_OUTPATIENT_CLINIC_OR_DEPARTMENT_OTHER): Payer: Medicare Other

## 2014-08-31 ENCOUNTER — Ambulatory Visit (HOSPITAL_BASED_OUTPATIENT_CLINIC_OR_DEPARTMENT_OTHER): Payer: Medicare Other | Admitting: Oncology

## 2014-08-31 VITALS — BP 117/59 | HR 67 | Temp 98.5°F | Resp 18 | Ht <= 58 in | Wt 119.3 lb

## 2014-08-31 DIAGNOSIS — Z95828 Presence of other vascular implants and grafts: Secondary | ICD-10-CM

## 2014-08-31 DIAGNOSIS — C9112 Chronic lymphocytic leukemia of B-cell type in relapse: Secondary | ICD-10-CM

## 2014-08-31 DIAGNOSIS — C9110 Chronic lymphocytic leukemia of B-cell type not having achieved remission: Secondary | ICD-10-CM

## 2014-08-31 DIAGNOSIS — D649 Anemia, unspecified: Secondary | ICD-10-CM | POA: Diagnosis not present

## 2014-08-31 DIAGNOSIS — D630 Anemia in neoplastic disease: Secondary | ICD-10-CM

## 2014-08-31 DIAGNOSIS — R87612 Low grade squamous intraepithelial lesion on cytologic smear of cervix (LGSIL): Secondary | ICD-10-CM

## 2014-08-31 DIAGNOSIS — R509 Fever, unspecified: Secondary | ICD-10-CM

## 2014-08-31 DIAGNOSIS — C9192 Lymphoid leukemia, unspecified, in relapse: Secondary | ICD-10-CM

## 2014-08-31 LAB — PAP IG (IMAGE GUIDED)

## 2014-08-31 LAB — COMPREHENSIVE METABOLIC PANEL (CC13)
ALBUMIN: 3.7 g/dL (ref 3.5–5.0)
ALT: 17 U/L (ref 0–55)
AST: 19 U/L (ref 5–34)
Alkaline Phosphatase: 70 U/L (ref 40–150)
Anion Gap: 7 mEq/L (ref 3–11)
BUN: 12.6 mg/dL (ref 7.0–26.0)
CHLORIDE: 106 meq/L (ref 98–109)
CO2: 26 meq/L (ref 22–29)
Calcium: 8.9 mg/dL (ref 8.4–10.4)
Creatinine: 0.8 mg/dL (ref 0.6–1.1)
EGFR: 81 mL/min/{1.73_m2} — ABNORMAL LOW (ref >90)
Glucose: 89 mg/dl (ref 70–140)
POTASSIUM: 4 meq/L (ref 3.5–5.1)
SODIUM: 139 meq/L (ref 136–145)
TOTAL PROTEIN: 5.9 g/dL — AB (ref 6.4–8.3)
Total Bilirubin: 0.41 mg/dL (ref 0.20–1.20)

## 2014-08-31 LAB — CBC WITH DIFFERENTIAL/PLATELET
BASO%: 0.4 % (ref 0.0–2.0)
Basophils Absolute: 0.1 10*3/uL (ref 0.0–0.1)
EOS%: 1 % (ref 0.0–7.0)
Eosinophils Absolute: 0.1 10*3/uL (ref 0.0–0.5)
HEMATOCRIT: 37.7 % (ref 34.8–46.6)
HGB: 12.4 g/dL (ref 11.6–15.9)
LYMPH%: 75.3 % — ABNORMAL HIGH (ref 14.0–49.7)
MCH: 29.7 pg (ref 25.1–34.0)
MCHC: 32.9 g/dL (ref 31.5–36.0)
MCV: 90.4 fL (ref 79.5–101.0)
MONO#: 0.9 10*3/uL (ref 0.1–0.9)
MONO%: 7.2 % (ref 0.0–14.0)
NEUT#: 2.1 10*3/uL (ref 1.5–6.5)
NEUT%: 16.1 % — AB (ref 38.4–76.8)
PLATELETS: 127 10*3/uL — AB (ref 145–400)
RBC: 4.17 10*6/uL (ref 3.70–5.45)
RDW: 12.6 % (ref 11.2–14.5)
WBC: 13 10*3/uL — ABNORMAL HIGH (ref 3.9–10.3)
lymph#: 9.8 10*3/uL — ABNORMAL HIGH (ref 0.9–3.3)

## 2014-08-31 LAB — LACTATE DEHYDROGENASE (CC13): LDH: 164 U/L (ref 125–245)

## 2014-08-31 MED ORDER — HEPARIN SOD (PORK) LOCK FLUSH 100 UNIT/ML IV SOLN
500.0000 [IU] | Freq: Once | INTRAVENOUS | Status: AC
  Administered 2014-08-31: 500 [IU] via INTRAVENOUS
  Filled 2014-08-31: qty 5

## 2014-08-31 MED ORDER — SODIUM CHLORIDE 0.9 % IJ SOLN
10.0000 mL | INTRAMUSCULAR | Status: DC | PRN
  Administered 2014-08-31: 10 mL via INTRAVENOUS
  Filled 2014-08-31: qty 10

## 2014-08-31 NOTE — Progress Notes (Signed)
ID: Rachel [name]   DOB: [birth year]  MR#: [id number]  QAS#:[id number]  PCP: [name] [facility] GYN: SU:  OTHER MD: [name] M.D.  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 She has been treated with Rituxan, ofatumumab, cladribine, bendamustine and currently with ibrutinib, with details summarized below.  INTERVAL HISTORY: Rachel [name] returns today for follow up of her chronic lymphoid leukemia. She has been in [place], [place], and many places in [place] and is planning a trip to [place] leaving tomorrow. Throughout all this she has been on ibrutinib. She was taking one tablet daily and then we corrected that and she started taking 3 tablets daily but then she started having problems. She complains of fatigue, dry mouth, mouth sores, and headaches. She also feels that she bruises more easily. She then dropped the dose again to 1 tablet daily and tolerates that with no side effects.  While in [place] she also went to [facility] to discuss the possibility of transplantation. They felt that she was doing well enough with her current treatment that transplantation was not a good option for her, if it ever is. She felt reassured after that visit.  REVIEW OF SYSTEMS: She is not exercising regularly now because there are out of town so much she has dropped her gym membership. Occasionally she has chills, and she does have night sweats and hot flashes despite being on hormone replacement. She worries about her weight. She has some insomnia. Sometimes she has blurred vision. Currently she is having seasonal sinus problems. She has chronic heartburn. She has stress urinary incontinence. She has low back pain which is not more intense or persistent than before. She admits to anxiety and depression. A detailed review of  systems today was otherwise stable.  PAST MEDICAL HISTORY: Past Medical History  Diagnosis Date  . Cancer   . Leukemia   . Lower back pain     PAST SURGICAL HISTORY: Past Surgical History  Procedure Laterality Date  . Tonsillectomy    . Other surgical history      removal of uterine lining  . Lining of uterus removed    . Bunionectomy      right foot  . Breast surgery  2015    left breast bx    FAMILY HISTORY Family History  Problem Relation Age of Onset  . Depression Son     GYNECOLOGIC HISTORY: Menarche age 60, first live birth age 60, she is [place] P3. She stopped having periods in 1999 after endometrial stripping. She continues on hormone replacement.  SOCIAL HISTORY: [name] is retired from [employer]. Her son [name] lives in [place] and has 3 sons; he works for a Pharmacist, community. A second son lives in [place] and has two children. Son [name] lives in [place] and has a daughter, as well as 2 sons by marriage.   ADVANCED DIRECTIVES: In place  HEALTH MAINTENANCE: History  Substance Use Topics  . Smoking status: Never Smoker   . Smokeless tobacco: Never Used  . Alcohol Use: Yes     Comment: socially     Colonoscopy: due  PAP: December 2012/ [name]  Bone density: Due  Lipid panel: per Dr Drema Dallas  Allergies  Allergen Reactions  . Penicillins Anaphylaxis    "stopped breathing" "drops my heartbeat" "I just pass out"    Current Outpatient Prescriptions  Medication Sig Dispense Refill  . acetaminophen (TYLENOL) 500 MG tablet Take 500 mg by mouth every 6 (six) hours as needed for headache.    Marland Kitchen acyclovir (ZOVIRAX) 400 MG tablet Take 1 tablet (400 mg total) by mouth 2 (two) times daily. 180 tablet 4  . allopurinol (ZYLOPRIM) 300 MG tablet Take 1 tablet (300 mg total) by mouth daily. 90 tablet 0  . aspirin 81 MG tablet Take 81 mg by mouth daily.    . Calcium Citrate (CITRACAL PO) Take 4 tablets by mouth daily.     . cholecalciferol (VITAMIN D)  1000 UNITS tablet Take 4,000 Units by mouth daily.    Marland Kitchen ELESTRIN 0.52 MG/0.87 GM (0.06%) GEL APPLY 2 PUMPS THINLY TO UPPER ARM DAILY AT BEDTIME 156 g 3  . ibrutinib (IMBRUVICA) 140 MG capsul Take 3 capsules (420 mg total) by mouth daily. 90 capsule 6  . lidocaine-prilocaine (EMLA) cream Apply a nickel size amount to skin on top of port- cover with saran wrap at least 1 hour before access 30 g 0  . prochlorperazine (COMPAZINE) 10 MG tablet Take 10 mg by mouth every 6 (six) hours as needed for nausea or vomiting.    . progesterone (PROMETRIUM) 200 MG capsule Take 200 mg by mouth See admin instructions. on days 1-12 every other month    . progesterone (PROMETRIUM) 200 MG capsule TAKE 1 CAPSULE ON DAYS 1 THROUGH 12 EVERY OTHER MONTH 24 capsule 2  . traZODone (DESYREL) 50 MG tablet Take 50 mg by mouth at bedtime.     No current facility-administered medications for this visit.    OBJECTIVE: Middle-aged white woman who looks well  Filed Vitals:   08/31/14 1117  BP: 117/59  Pulse: 67  Temp: 98.5 F (36.9 C)  Resp: 18   Filed Weights   08/31/14 1117  Weight: 119 lb 4.8 oz (54.114 kg)   Body mass index is 24.94 kg/(m^2).    ECOG FS: 0  Sclerae unicteric, EOMs intact Oropharynx clear, dentition in good repair No cervical or supraclavicular adenopathy, no axillary or inguinal adenopathy Lungs no rales or rhonchi Heart regular rate and rhythm Abd soft, nontender, positive bowel sounds MSK no focal spinal tenderness, no upper extremity lymphedema Neuro: nonfocal, well oriented, appropriate affect Breasts: Deferred  LAB RESULTS:  Lab Results  Component Value Date   WBC 13.0* 08/31/2014   NEUTROABS 2.1 08/31/2014   HGB 12.4 08/31/2014   HCT 37.7 08/31/2014   MCV 90.4 08/31/2014   PLT 127* 08/31/2014  Results for VELMER, BROADFOOT (MRN [id number]) as of 08/31/2014 11:26  Ref. Range 04/03/2014 08:34 04/20/2014 12:43 05/01/2014 10:29 05/26/2014 11:25 06/15/2014 10:34  LDH Latest Ref Range:  125-245 U/L 259 (H) 249 (H) 238 207 202      Chemistry      Component Value Date/Time   NA 140 06/15/2014 1034   NA 139 02/28/2014 0525   K 4.0 06/15/2014 1034   K 3.6 02/28/2014 0525   CL 115* 02/28/2014 0525   CL 101 12/15/2011 1322   CO2 22 06/15/2014 1034   CO2 22 02/28/2014 0525   BUN 10.2 06/15/2014 1034   BUN 10 02/28/2014 0525   CREATININE 0.7 06/15/2014 1034   CREATININE 0.61 02/28/2014 0525      Component Value Date/Time   CALCIUM 8.8 06/15/2014 1034   CALCIUM 7.2* 02/28/2014 0525   ALKPHOS 82 06/15/2014 1034   ALKPHOS 121* 02/27/2014 1705   AST 35* 06/15/2014 1034   AST 142* 02/27/2014  1705   ALT 37 06/15/2014 1034   ALT 71* 02/27/2014 1705   BILITOT 0.23 06/15/2014 1034   BILITOT 0.2* 02/27/2014 1705    Results for LAKEYSHIA, TUCKERMAN (MRN [id number]) as of 08/31/2014 11:26  Ref. Range 04/20/2014 12:43 05/01/2014 10:28 05/26/2014 11:21 06/15/2014 10:34 08/31/2014 10:46  lymph# Latest Ref Range: 0.9-3.3 10e3/uL 27.8 (H) 26.8 (H) 20.1 (H) 12.3 (H) 9.8 (H)   STUDIES: Mammography at [facility] 03/30/2014 was unremarkable  ASSESSMENT: 64 y.o.  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 rigors 02/27/2014: received IV vancomycin and levaquin briefly, then levaquin po (pcn allergy)  (6) bendamustine/ rituximab for 6 cycles, completed 05/04/2014,  with improvement but not normalization of the absolute lymphocyte count  (7) ibrutinib started 05/08/2014  PLAN: Rachel [name] has taking the [place] noted in various doses but the bottom line is when she takes 3 tablets daily she has dry mouth, mouth sores, fatigue, and headaches. When she takes 1 tablet a day she doesn't have any symptoms whatsoever.  We're going to try 2 tablets daily and see if she can tolerate that well. It will be closer to the standard dose. In any case I am delighted that her absolute lymphocyte count continues to drop and that she has no peripheral adenopathy or any other symptoms related to either the lymphoma or its treatment.  She is going to be in and out of town through multiple trips to [place] and elsewhere. She will have lab work here in 6 weeks and her port will be flushed at the same time. She will then see me on September 22 again with lab work the same day. The plan is to continue the ibrutinib until she has side effects that she cannot tolerate or disease progression  Chauncey Cruel, [place] [phone] 08/31/2014

## 2014-08-31 NOTE — Patient Instructions (Signed)

## 2014-09-14 ENCOUNTER — Encounter: Payer: Self-pay | Admitting: *Deleted

## 2014-09-19 ENCOUNTER — Ambulatory Visit: Payer: Medicare Other | Admitting: Oncology

## 2014-09-19 ENCOUNTER — Other Ambulatory Visit: Payer: Medicare Other

## 2014-10-02 ENCOUNTER — Telehealth: Payer: Self-pay | Admitting: *Deleted

## 2014-10-02 DIAGNOSIS — R519 Headache, unspecified: Secondary | ICD-10-CM

## 2014-10-02 DIAGNOSIS — C9110 Chronic lymphocytic leukemia of B-cell type not having achieved remission: Secondary | ICD-10-CM

## 2014-10-02 DIAGNOSIS — R51 Headache: Secondary | ICD-10-CM

## 2014-10-02 NOTE — Telephone Encounter (Signed)
This RN spoke with pt per her call stating concerns noted post taking 2 tablets a day of  the ibrutinib while in [place].  " I am having a lot of joint pain and stiffness " " my right knee hurts after sitting too long and my left leg feels like there is something hard when I go to stand up "  " when I walk it gets better but why do I feel like this "  " my knees when I am going down the stairs feel like they have a hole in the middle "  " the headaches have gotten worse even with the stronger pain medication "  Rachel [name] also states she has increased cramping in her feet and toes - " and when I was in [facility] and had my labs checked my potassium was 3.3 "  Discussed above- pt is reluctant to hold or decrease medication due to Dr Jannifer Rodney being out of the office " and I want him to tell me what to do"  Pt will come in tomorrow for labs and to see this RN.

## 2014-10-02 NOTE — Telephone Encounter (Signed)
Patient contacted the office requesting to have her abnormal pap results explained to her. Results reviewed with patient and patient verbalized understanding. Repeat pap scheduled for 03/06/15 @ 9 am.

## 2014-10-03 ENCOUNTER — Other Ambulatory Visit: Payer: Self-pay | Admitting: *Deleted

## 2014-10-03 ENCOUNTER — Other Ambulatory Visit (HOSPITAL_BASED_OUTPATIENT_CLINIC_OR_DEPARTMENT_OTHER): Payer: Medicare Other

## 2014-10-03 DIAGNOSIS — C9110 Chronic lymphocytic leukemia of B-cell type not having achieved remission: Secondary | ICD-10-CM

## 2014-10-03 DIAGNOSIS — R519 Headache, unspecified: Secondary | ICD-10-CM

## 2014-10-03 DIAGNOSIS — C9112 Chronic lymphocytic leukemia of B-cell type in relapse: Secondary | ICD-10-CM | POA: Diagnosis present

## 2014-10-03 DIAGNOSIS — R51 Headache: Secondary | ICD-10-CM

## 2014-10-03 LAB — CBC WITH DIFFERENTIAL/PLATELET
BASO%: 0.4 % (ref 0.0–2.0)
Basophils Absolute: 0 10*3/uL (ref 0.0–0.1)
EOS%: 1.9 % (ref 0.0–7.0)
Eosinophils Absolute: 0.2 10*3/uL (ref 0.0–0.5)
HCT: 36.8 % (ref 34.8–46.6)
HEMOGLOBIN: 11.9 g/dL (ref 11.6–15.9)
LYMPH%: 80.3 % — AB (ref 14.0–49.7)
MCH: 28.6 pg (ref 25.1–34.0)
MCHC: 32.4 g/dL (ref 31.5–36.0)
MCV: 88.3 fL (ref 79.5–101.0)
MONO#: 0.5 10*3/uL (ref 0.1–0.9)
MONO%: 5.5 % (ref 0.0–14.0)
NEUT#: 1.1 10*3/uL — ABNORMAL LOW (ref 1.5–6.5)
NEUT%: 11.9 % — AB (ref 38.4–76.8)
PLATELETS: 132 10*3/uL — AB (ref 145–400)
RBC: 4.17 10*6/uL (ref 3.70–5.45)
RDW: 12.6 % (ref 11.2–14.5)
WBC: 8.9 10*3/uL (ref 3.9–10.3)
lymph#: 7.2 10*3/uL — ABNORMAL HIGH (ref 0.9–3.3)

## 2014-10-03 LAB — TECHNOLOGIST REVIEW

## 2014-10-03 LAB — COMPREHENSIVE METABOLIC PANEL (CC13)
ALBUMIN: 3.6 g/dL (ref 3.5–5.0)
ALK PHOS: 71 U/L (ref 40–150)
ALT: 17 U/L (ref 0–55)
AST: 17 U/L (ref 5–34)
Anion Gap: 5 mEq/L (ref 3–11)
BILIRUBIN TOTAL: 0.24 mg/dL (ref 0.20–1.20)
BUN: 14.5 mg/dL (ref 7.0–26.0)
CALCIUM: 8.7 mg/dL (ref 8.4–10.4)
CO2: 29 meq/L (ref 22–29)
CREATININE: 1 mg/dL (ref 0.6–1.1)
Chloride: 105 mEq/L (ref 98–109)
EGFR: 58 mL/min/{1.73_m2} — ABNORMAL LOW (ref >90)
GLUCOSE: 91 mg/dL (ref 70–140)
POTASSIUM: 4.2 meq/L (ref 3.5–5.1)
Sodium: 139 mEq/L (ref 136–145)
Total Protein: 5.9 g/dL — ABNORMAL LOW (ref 6.4–8.3)

## 2014-10-03 MED ORDER — HYDROCODONE-ACETAMINOPHEN 5-325 MG PO TABS: 2.0000 | ORAL_TABLET | Freq: Four times a day (QID) | ORAL | Status: DC | PRN

## 2014-10-03 NOTE — Telephone Encounter (Signed)
This RN spoke with patient post lab draw and review of current symptoms with HB/NP.  Due to current symptoms and noted ANC of 1.1 with WBC of 8.9- recommendation is to hold the Imbruvica until pt is seen by MD at scheduled appointment next week.  The above discussed with patient- as well interventions regarding fatigue - muscle/joint pain and headache.  Pt brought in packet of " dehydration powder " she obtained while in [place] and asked " what is in it "- ingrediants written in spanish  Noted per obtaining translation powder contained 1.5g of potassium chloride.  Per discussion with pt this RN informed pt to not use the above at this time due to strength and form of potassium-   Rachel [name] verbalized understanding. Obtained refill for Norco for headaches and body pain.

## 2014-10-05 ENCOUNTER — Telehealth: Payer: Self-pay | Admitting: *Deleted

## 2014-10-05 NOTE — Telephone Encounter (Signed)
TC from patient stating that she is feeling very very fatigued, dizzy and just not well at all. She states she had labs done 10/03/14. Labs notable for ANC of 1.1. Denies fever, chills.  States BP is  91/50. Patient states her normal BP is in the 90's. Spoke with Val, RN (Dr. Virgie Dad nurse) and she will call patient.

## 2014-10-05 NOTE — Telephone Encounter (Signed)
Per review with on call MD- recommendation is for pt to take extra Vitamin D at for 5 days.  Pt is currently taking 4000 units daily and will increase to 6000 units a day for 5 days.  Pt understands to maintain ADL's as best as possible with rest but not " sleep " to assist with probable jet lag. Pt understands at present to not exert herself per activities such as zumba or extensive house work.  Pt is hydrating well and " starting to feel better ".  Rachel [name] will contact this RN if symptoms worsen or other concerns arise.

## 2014-10-05 NOTE — Telephone Encounter (Signed)
This RN spoke with pt per her current symptoms.  At present Rachel [name] is being held with last dose on Monday 8/8.  [name] states she woke up " feeling good and energetic but now just terrible- feel very weak and my limbs are very weak "  She is unaware of any fevers, no cough or chills " just so tired "  She is able to drink and has taken in water and Gatorade.  Of note pt has been traveling over the past 2 months to [place] and then and extended visit to [place]. She returned from [place] on 09/29/2014.  Per phone call this RN had pt check B/P while standing ( asked to apply while sitting and obtain immediately upon standing) with reading of 82/46.  This RN discussed above more likely related to recent travel and side effects of Imbruvica.  At present pt has been instructed to rest and hydrate and post review with practitioner  pt will be called back.

## 2014-10-10 ENCOUNTER — Ambulatory Visit: Payer: Medicare Other | Admitting: Oncology

## 2014-10-10 ENCOUNTER — Other Ambulatory Visit: Payer: Medicare Other

## 2014-10-11 ENCOUNTER — Other Ambulatory Visit (HOSPITAL_BASED_OUTPATIENT_CLINIC_OR_DEPARTMENT_OTHER): Payer: Medicare Other

## 2014-10-11 ENCOUNTER — Ambulatory Visit (HOSPITAL_BASED_OUTPATIENT_CLINIC_OR_DEPARTMENT_OTHER): Payer: Medicare Other | Admitting: Oncology

## 2014-10-11 ENCOUNTER — Ambulatory Visit (HOSPITAL_BASED_OUTPATIENT_CLINIC_OR_DEPARTMENT_OTHER): Payer: Medicare Other

## 2014-10-11 VITALS — BP 117/52 | HR 70 | Temp 97.7°F | Resp 18 | Ht <= 58 in | Wt 123.5 lb

## 2014-10-11 DIAGNOSIS — D630 Anemia in neoplastic disease: Secondary | ICD-10-CM

## 2014-10-11 DIAGNOSIS — R51 Headache: Secondary | ICD-10-CM

## 2014-10-11 DIAGNOSIS — C9110 Chronic lymphocytic leukemia of B-cell type not having achieved remission: Secondary | ICD-10-CM | POA: Diagnosis not present

## 2014-10-11 DIAGNOSIS — C9192 Lymphoid leukemia, unspecified, in relapse: Secondary | ICD-10-CM

## 2014-10-11 DIAGNOSIS — D759 Disease of blood and blood-forming organs, unspecified: Secondary | ICD-10-CM | POA: Diagnosis not present

## 2014-10-11 DIAGNOSIS — R87612 Low grade squamous intraepithelial lesion on cytologic smear of cervix (LGSIL): Secondary | ICD-10-CM

## 2014-10-11 DIAGNOSIS — A630 Anogenital (venereal) warts: Secondary | ICD-10-CM | POA: Diagnosis not present

## 2014-10-11 DIAGNOSIS — R519 Headache, unspecified: Secondary | ICD-10-CM

## 2014-10-11 DIAGNOSIS — R509 Fever, unspecified: Secondary | ICD-10-CM

## 2014-10-11 DIAGNOSIS — C9112 Chronic lymphocytic leukemia of B-cell type in relapse: Secondary | ICD-10-CM

## 2014-10-11 DIAGNOSIS — Z95828 Presence of other vascular implants and grafts: Secondary | ICD-10-CM

## 2014-10-11 LAB — COMPREHENSIVE METABOLIC PANEL (CC13)
ALT: 77 U/L — ABNORMAL HIGH (ref 0–55)
ANION GAP: 8 meq/L (ref 3–11)
AST: 67 U/L — ABNORMAL HIGH (ref 5–34)
Albumin: 3.7 g/dL (ref 3.5–5.0)
Alkaline Phosphatase: 138 U/L (ref 40–150)
BILIRUBIN TOTAL: 0.3 mg/dL (ref 0.20–1.20)
BUN: 10.1 mg/dL (ref 7.0–26.0)
CO2: 25 meq/L (ref 22–29)
Calcium: 9 mg/dL (ref 8.4–10.4)
Chloride: 108 mEq/L (ref 98–109)
Creatinine: 1 mg/dL (ref 0.6–1.1)
EGFR: 59 mL/min/{1.73_m2} — AB (ref >90)
Glucose: 94 mg/dl (ref 70–140)
Potassium: 4.1 mEq/L (ref 3.5–5.1)
Sodium: 141 mEq/L (ref 136–145)
TOTAL PROTEIN: 6.2 g/dL — AB (ref 6.4–8.3)

## 2014-10-11 LAB — CBC WITH DIFFERENTIAL/PLATELET
BASO%: 0.6 % (ref 0.0–2.0)
BASOS ABS: 0 10*3/uL (ref 0.0–0.1)
EOS ABS: 0.2 10*3/uL (ref 0.0–0.5)
EOS%: 2.2 % (ref 0.0–7.0)
HEMATOCRIT: 36.8 % (ref 34.8–46.6)
HEMOGLOBIN: 12.3 g/dL (ref 11.6–15.9)
LYMPH#: 3.6 10*3/uL — AB (ref 0.9–3.3)
LYMPH%: 52 % — ABNORMAL HIGH (ref 14.0–49.7)
MCH: 28.8 pg (ref 25.1–34.0)
MCHC: 33.4 g/dL (ref 31.5–36.0)
MCV: 86.2 fL (ref 79.5–101.0)
MONO#: 0.6 10*3/uL (ref 0.1–0.9)
MONO%: 8 % (ref 0.0–14.0)
NEUT%: 2.4 % — ABNORMAL LOW (ref 38.4–76.8)
NEUTROS ABS: 0.2 10*3/uL — AB (ref 1.5–6.5)
NRBC: 0 % (ref 0–0)
PLATELETS: 91 10*3/uL — AB (ref 145–400)
RBC: 4.27 10*6/uL (ref 3.70–5.45)
RDW: 12.8 % (ref 11.2–14.5)
WBC: 6.9 10*3/uL (ref 3.9–10.3)

## 2014-10-11 LAB — LACTATE DEHYDROGENASE (CC13): LDH: 409 U/L — AB (ref 125–245)

## 2014-10-11 LAB — TECHNOLOGIST REVIEW: Technologist Review: 27

## 2014-10-11 MED ORDER — SODIUM CHLORIDE 0.9 % IJ SOLN
10.0000 mL | INTRAMUSCULAR | Status: DC | PRN
  Administered 2014-10-11: 10 mL via INTRAVENOUS
  Filled 2014-10-11: qty 10

## 2014-10-11 MED ORDER — HEPARIN SOD (PORK) LOCK FLUSH 100 UNIT/ML IV SOLN
500.0000 [IU] | Freq: Once | INTRAVENOUS | Status: AC
Start: 2014-10-11 — End: 2014-10-11
  Administered 2014-10-11: 500 [IU] via INTRAVENOUS
  Filled 2014-10-11: qty 5

## 2014-10-11 NOTE — Progress Notes (Signed)
ID: Rachel [name]   DOB: [birth year]  MR#: [id number]  FBX#:[id number]  PCP: Rachel [name] GYN: SU:  OTHER [name]: Rachel Polio M.D.  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 She has been treated with Rituxan, ofatumumab, cladribine, bendamustine and currently with ibrutinib, with details summarized below.  INTERVAL HISTORY: Rachel [name] returns today for follow up of her chronic lymphoid leukemia. She has been traveling almost nonstop for the last 3 months. She has been to [place], [place], and [place]. She had a very good time but it was stressful. She is glad to be home on a more regular diet and now has read joined the gym where she is exercising more regularly.--She dropped her ibrutinib dose to 1 tablet a day and we stopped it altogether on 10/02/2014 because of dropping counts and headaches. From a lymphoma point of view she reports no fevers, rash, itching, unexplained fatigue or unexplained weight loss. There has been no adenopathy. Incidentally she obtains the drug at a very good price  REVIEW OF SYSTEMS: She was having significant headaches for about 2 weeks but they are now much better. 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 She has arthritis problems. They involve multiple joints. She has a runny nose, a little bit of hoarseness, minor oral ankle swelling, some heartburn, rare problems with nausea, chronic stress urinary incontinence, easy bruising, and some forgetfulness, anxiety, and depression. Hot flashes are mild. A detailed review of systems today was otherwise stable  PAST MEDICAL HISTORY: Past Medical History  Diagnosis Date  . Cancer   . Leukemia   . Lower back pain     PAST SURGICAL HISTORY: Past Surgical History  Procedure Laterality  Date  . Tonsillectomy    . Other surgical history      removal of uterine lining  . Lining of uterus removed    . Bunionectomy      right foot  . Breast surgery  2015    left breast bx    FAMILY HISTORY Family History  Problem Relation Age of Onset  . Depression Son     GYNECOLOGIC HISTORY: Menarche age 32, first live birth age 58, she is Rachel [name]. She stopped having periods in 1999 after endometrial stripping. She continues on hormone replacement.  SOCIAL HISTORY: Her husband Rachel [name] is retired from [name]. Her son Rachel [name] lives in [name] and has 3 sons; he works for a [employer]ist, community. A second son lives in [name] and has two children. Son Rachel [name] lives in [name] and has a daughter, as well as 2 sons by marriage.   ADVANCED DIRECTIVES: In place  HEALTH MAINTENANCE: Social History  Substance Use Topics  . Smoking status: Never Smoker   . Smokeless tobacco: Never Used  . Alcohol Use: Yes     Comment: socially     Colonoscopy: due  PAP: December 2012/ Rachel [name]  Bone density: Due  Lipid panel: per Dr Rachel [name]  Allergies  Allergen Reactions  . Penicillins Anaphylaxis    "stopped breathing" "drops my heartbeat" "I just pass out"    Current Outpatient Prescriptions  Medication Sig Dispense Refill  . acetaminophen (TYLENOL) 500 MG tablet Take 500 mg by mouth every 6 (six) hours as needed for headache.    Marland Kitchen acyclovir (ZOVIRAX) 400 MG tablet Take 1 tablet (400  mg total) by mouth 2 (two) times daily. 180 tablet 4  . allopurinol (ZYLOPRIM) 300 MG tablet Take 1 tablet (300 mg total) by mouth daily. 90 tablet 0  . aspirin 81 MG tablet Take 81 mg by mouth daily.    . Calcium Citrate (CITRACAL PO) Take 4 tablets by mouth daily.     . cholecalciferol (VITAMIN D) 1000 UNITS tablet Take 4,000 Units by mouth daily.    Marland Kitchen ELESTRIN 0.52 MG/0.87 GM (0.06%) GEL APPLY 2 PUMPS THINLY TO UPPER ARM DAILY AT BEDTIME 156 g 3  . HYDROcodone-acetaminophen (NORCO) 5-325 MG per tablet Take  2 tablets by mouth every 6 (six) hours as needed for moderate pain. 120 tablet 0  . ibrutinib (IMBRUVICA) 140 MG capsul Take 3 capsules (420 mg total) by mouth daily. 90 capsule 6  . lidocaine-prilocaine (EMLA) cream Apply a nickel size amount to skin on top of port- cover with saran wrap at least 1 hour before access 30 g 0  . prochlorperazine (COMPAZINE) 10 MG tablet Take 10 mg by mouth every 6 (six) hours as needed for nausea or vomiting.    . progesterone (PROMETRIUM) 200 MG capsule TAKE 1 CAPSULE ON DAYS 1 THROUGH 12 EVERY OTHER MONTH 24 capsule 2  . traZODone (DESYREL) 50 MG tablet Take 50 mg by mouth at bedtime.     No current facility-administered medications for this visit.    OBJECTIVE: Middle-aged white woman in no acute distress Filed Vitals:   10/11/14 1053  BP: 117/52  Pulse: 70  Temp: 97.7 F (36.5 C)  Resp: 18   Filed Weights   10/11/14 1053  Weight: 123 lb 8 oz (56.019 kg)   Body mass index is 25.82 kg/(m^2).    ECOG FS: 0  Sclerae unicteric, pupils round and equal Oropharynx clear and moist-- no thrush or other lesions No cervical or supraclavicular adenopathy Lungs no rales or rhonchi Heart regular rate and rhythm Abd soft, nontender, positive bowel sounds MSK no focal spinal tenderness, no upper extremity lymphedema Neuro: nonfocal, well oriented, appropriate affect Breasts: Deferred   LAB RESULTS:  Lab Results  Component Value Date   WBC 6.9 10/11/2014   NEUTROABS 0.2* 10/11/2014   HGB 12.3 10/11/2014   HCT 36.8 10/11/2014   MCV 86.2 10/11/2014   PLT 91* 10/11/2014      Chemistry      Component Value Date/Time   NA 139 10/03/2014 1337   NA 139 02/28/2014 0525   K 4.2 10/03/2014 1337   K 3.6 02/28/2014 0525   CL 115* 02/28/2014 0525   CL 101 12/15/2011 1322   CO2 29 10/03/2014 1337   CO2 22 02/28/2014 0525   BUN 14.5 10/03/2014 1337   BUN 10 02/28/2014 0525   CREATININE 1.0 10/03/2014 1337   CREATININE 0.61 02/28/2014 0525       Component Value Date/Time   CALCIUM 8.7 10/03/2014 1337   CALCIUM 7.2* 02/28/2014 0525   ALKPHOS 71 10/03/2014 1337   ALKPHOS 121* 02/27/2014 1705   AST 17 10/03/2014 1337   AST 142* 02/27/2014 1705   ALT 17 10/03/2014 1337   ALT 71* 02/27/2014 1705   BILITOT 0.24 10/03/2014 1337   BILITOT 0.2* 02/27/2014 1705    Results for REGENA, DELUCCHI A (MRN [id number]) as of 10/11/2014 11:32  Ref. Range 04/03/2014 08:34 04/20/2014 12:43 05/01/2014 10:28 05/26/2014 11:21 06/15/2014 10:34 08/31/2014 10:46 10/03/2014 13:37 10/11/2014 10:29  lymph# Latest Ref Range: 0.9-3.3 10e3/uL 32.5 (H) 27.8 (H) 26.8 (H) 20.1 (H)  12.3 (H) 9.8 (H) 7.2 (H) 4.3 (H)   STUDIES: No results found.   ASSESSMENT: 64 y.o.  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 rigors 02/27/2014: received IV vancomycin and levaquin briefly, then levaquin po (pcn allergy)  (6) bendamustine/ rituximab for 6 cycles, completed 05/04/2014, with improvement but not normalization of the absolute lymphocyte count  (7) ibrutinib started 05/08/2014, taken with some irregularity, stopped 10/02/2014 with neutropenia  PLAN: Rachel [name] has had a very good response to the ibrutinib. However she has tolerated it poorly. She has not been able to stay on 3 tablets daily. She has had very little trouble with 1 tablet daily.  At  this point we cannot resume the drug, because of her cytopenias, but we are going to check her labs in 2 weeks and then every 2 weeks for the next 2-3 months. When we do resume the ibrutinib it will be at one tablet a day. If that controls her chronic lymphocytic leukemia then that will be our chronic dose.  I don't think the headaches she has been having are really related to the ibrutinib. She is under a lot of stress and these are nuchal headaches, so stress can be a precipitating factor. She has also done a great deal of traveling and has been in very uncomfortable positions in long airplane rides. I think as she resumes her regular exercise program and stretching and yoga those things will take care of the issue better than pain medicine. The massage wouldn't hurt either.  She is very concerned about her HPV. She is on acyclovir because of the ibrutinib I would continue it on that basis. On the other hand I think she can stop the allopurinol at this point  She will see me again in early November. She will have her port flushed late September. As noted we will continue to check labs on an every 2 week basis and we will resume the ibrutinib treatments [place] in his counts recover  Chauncey Cruel, [place] [phone] 10/11/2014

## 2014-10-11 NOTE — Patient Instructions (Signed)

## 2014-10-12 ENCOUNTER — Other Ambulatory Visit: Payer: Medicare Other

## 2014-10-24 ENCOUNTER — Other Ambulatory Visit: Payer: Self-pay | Admitting: Oncology

## 2014-10-24 ENCOUNTER — Other Ambulatory Visit (HOSPITAL_BASED_OUTPATIENT_CLINIC_OR_DEPARTMENT_OTHER): Payer: Medicare Other

## 2014-10-24 DIAGNOSIS — D630 Anemia in neoplastic disease: Secondary | ICD-10-CM

## 2014-10-24 DIAGNOSIS — C9112 Chronic lymphocytic leukemia of B-cell type in relapse: Secondary | ICD-10-CM | POA: Diagnosis present

## 2014-10-24 DIAGNOSIS — C9110 Chronic lymphocytic leukemia of B-cell type not having achieved remission: Secondary | ICD-10-CM

## 2014-10-24 DIAGNOSIS — R509 Fever, unspecified: Secondary | ICD-10-CM

## 2014-10-24 DIAGNOSIS — C9192 Lymphoid leukemia, unspecified, in relapse: Secondary | ICD-10-CM

## 2014-10-24 DIAGNOSIS — R87612 Low grade squamous intraepithelial lesion on cytologic smear of cervix (LGSIL): Secondary | ICD-10-CM

## 2014-10-24 LAB — COMPREHENSIVE METABOLIC PANEL (CC13)
ALBUMIN: 3.9 g/dL (ref 3.5–5.0)
ALK PHOS: 109 U/L (ref 40–150)
ALT: 23 U/L (ref 0–55)
ANION GAP: 8 meq/L (ref 3–11)
AST: 23 U/L (ref 5–34)
BILIRUBIN TOTAL: 0.5 mg/dL (ref 0.20–1.20)
BUN: 12.3 mg/dL (ref 7.0–26.0)
CALCIUM: 9.2 mg/dL (ref 8.4–10.4)
CO2: 25 mEq/L (ref 22–29)
CREATININE: 0.9 mg/dL (ref 0.6–1.1)
Chloride: 106 mEq/L (ref 98–109)
EGFR: 65 mL/min/{1.73_m2} — AB (ref >90)
Glucose: 145 mg/dl — ABNORMAL HIGH (ref 70–140)
Potassium: 4.2 mEq/L (ref 3.5–5.1)
Sodium: 140 mEq/L (ref 136–145)
TOTAL PROTEIN: 6.4 g/dL (ref 6.4–8.3)

## 2014-10-24 LAB — CBC WITH DIFFERENTIAL/PLATELET
BASO%: 1.4 % (ref 0.0–2.0)
Basophils Absolute: 0.3 10*3/uL — ABNORMAL HIGH (ref 0.0–0.1)
EOS%: 2.4 % (ref 0.0–7.0)
Eosinophils Absolute: 0.5 10*3/uL (ref 0.0–0.5)
HCT: 39.5 % (ref 34.8–46.6)
HGB: 12.6 g/dL (ref 11.6–15.9)
LYMPH%: 67.3 % — AB (ref 14.0–49.7)
MCH: 27.9 pg (ref 25.1–34.0)
MCHC: 32 g/dL (ref 31.5–36.0)
MCV: 87.3 fL (ref 79.5–101.0)
MONO#: 0.1 10*3/uL (ref 0.1–0.9)
MONO%: 0.6 % (ref 0.0–14.0)
NEUT%: 28.3 % — AB (ref 38.4–76.8)
NEUTROS ABS: 5.4 10*3/uL (ref 1.5–6.5)
Platelets: 168 10*3/uL (ref 145–400)
RBC: 4.52 10*6/uL (ref 3.70–5.45)
RDW: 13.4 % (ref 11.2–14.5)
WBC: 19 10*3/uL — AB (ref 3.9–10.3)
lymph#: 12.8 10*3/uL — ABNORMAL HIGH (ref 0.9–3.3)

## 2014-10-24 LAB — TECHNOLOGIST REVIEW

## 2014-10-24 LAB — LACTATE DEHYDROGENASE (CC13): LDH: 255 U/L — ABNORMAL HIGH (ref 125–245)

## 2014-11-07 ENCOUNTER — Other Ambulatory Visit (HOSPITAL_BASED_OUTPATIENT_CLINIC_OR_DEPARTMENT_OTHER): Payer: Medicare Other

## 2014-11-07 DIAGNOSIS — D630 Anemia in neoplastic disease: Secondary | ICD-10-CM

## 2014-11-07 DIAGNOSIS — R87612 Low grade squamous intraepithelial lesion on cytologic smear of cervix (LGSIL): Secondary | ICD-10-CM

## 2014-11-07 DIAGNOSIS — C9110 Chronic lymphocytic leukemia of B-cell type not having achieved remission: Secondary | ICD-10-CM

## 2014-11-07 DIAGNOSIS — R509 Fever, unspecified: Secondary | ICD-10-CM

## 2014-11-07 DIAGNOSIS — C9112 Chronic lymphocytic leukemia of B-cell type in relapse: Secondary | ICD-10-CM | POA: Diagnosis present

## 2014-11-07 DIAGNOSIS — C9192 Lymphoid leukemia, unspecified, in relapse: Secondary | ICD-10-CM

## 2014-11-07 LAB — COMPREHENSIVE METABOLIC PANEL (CC13)
ALT: 27 U/L (ref 0–55)
AST: 28 U/L (ref 5–34)
Albumin: 4 g/dL (ref 3.5–5.0)
Alkaline Phosphatase: 98 U/L (ref 40–150)
Anion Gap: 7 mEq/L (ref 3–11)
BUN: 12.8 mg/dL (ref 7.0–26.0)
CHLORIDE: 104 meq/L (ref 98–109)
CO2: 27 meq/L (ref 22–29)
Calcium: 9.5 mg/dL (ref 8.4–10.4)
Creatinine: 1 mg/dL (ref 0.6–1.1)
EGFR: 59 mL/min/{1.73_m2} — AB (ref >90)
GLUCOSE: 86 mg/dL (ref 70–140)
POTASSIUM: 4.4 meq/L (ref 3.5–5.1)
SODIUM: 138 meq/L (ref 136–145)
Total Bilirubin: 0.33 mg/dL (ref 0.20–1.20)
Total Protein: 6.4 g/dL (ref 6.4–8.3)

## 2014-11-07 LAB — CBC WITH DIFFERENTIAL/PLATELET
BASO%: 1.3 % (ref 0.0–2.0)
BASOS ABS: 0.3 10*3/uL — AB (ref 0.0–0.1)
EOS%: 1.9 % (ref 0.0–7.0)
Eosinophils Absolute: 0.4 10*3/uL (ref 0.0–0.5)
HCT: 38.6 % (ref 34.8–46.6)
HEMOGLOBIN: 12.4 g/dL (ref 11.6–15.9)
LYMPH%: 83.3 % — ABNORMAL HIGH (ref 14.0–49.7)
MCH: 27.8 pg (ref 25.1–34.0)
MCHC: 32.1 g/dL (ref 31.5–36.0)
MCV: 86.4 fL (ref 79.5–101.0)
MONO#: 0.7 10*3/uL (ref 0.1–0.9)
MONO%: 2.9 % (ref 0.0–14.0)
NEUT#: 2.3 10*3/uL (ref 1.5–6.5)
NEUT%: 10.6 % — ABNORMAL LOW (ref 38.4–76.8)
Platelets: 168 10*3/uL (ref 145–400)
RBC: 4.47 10*6/uL (ref 3.70–5.45)
RDW: 13.7 % (ref 11.2–14.5)
WBC: 22.2 10*3/uL — ABNORMAL HIGH (ref 3.9–10.3)
lymph#: 18.5 10*3/uL — ABNORMAL HIGH (ref 0.9–3.3)

## 2014-11-07 LAB — LACTATE DEHYDROGENASE (CC13): LDH: 221 U/L (ref 125–245)

## 2014-11-07 LAB — TECHNOLOGIST REVIEW

## 2014-11-10 ENCOUNTER — Telehealth: Payer: Self-pay | Admitting: *Deleted

## 2014-11-10 NOTE — Telephone Encounter (Signed)
Message left by pt stating she will be in [place] from Oct 7 thru Nov 12 " and need to have my labs checked there "  This RN returned call to verify if prior facility is the same as : [facility] - fax number [phone].  Obtained identified VM for pt. Requested return call to verify above.

## 2014-11-16 ENCOUNTER — Other Ambulatory Visit: Payer: Medicare Other

## 2014-11-16 ENCOUNTER — Ambulatory Visit: Payer: Medicare Other | Admitting: Oncology

## 2014-11-21 ENCOUNTER — Other Ambulatory Visit: Payer: Medicare Other

## 2014-11-22 ENCOUNTER — Ambulatory Visit (HOSPITAL_BASED_OUTPATIENT_CLINIC_OR_DEPARTMENT_OTHER): Payer: Medicare Other

## 2014-11-22 ENCOUNTER — Other Ambulatory Visit (HOSPITAL_BASED_OUTPATIENT_CLINIC_OR_DEPARTMENT_OTHER): Payer: Medicare Other

## 2014-11-22 VITALS — BP 123/58 | HR 67 | Temp 98.0°F | Resp 16

## 2014-11-22 DIAGNOSIS — C9110 Chronic lymphocytic leukemia of B-cell type not having achieved remission: Secondary | ICD-10-CM

## 2014-11-22 DIAGNOSIS — C50912 Malignant neoplasm of unspecified site of left female breast: Secondary | ICD-10-CM | POA: Diagnosis not present

## 2014-11-22 DIAGNOSIS — C9192 Lymphoid leukemia, unspecified, in relapse: Secondary | ICD-10-CM

## 2014-11-22 DIAGNOSIS — Z95828 Presence of other vascular implants and grafts: Secondary | ICD-10-CM

## 2014-11-22 DIAGNOSIS — R509 Fever, unspecified: Secondary | ICD-10-CM

## 2014-11-22 DIAGNOSIS — R87612 Low grade squamous intraepithelial lesion on cytologic smear of cervix (LGSIL): Secondary | ICD-10-CM

## 2014-11-22 DIAGNOSIS — C9112 Chronic lymphocytic leukemia of B-cell type in relapse: Secondary | ICD-10-CM

## 2014-11-22 DIAGNOSIS — D630 Anemia in neoplastic disease: Secondary | ICD-10-CM

## 2014-11-22 LAB — CBC WITH DIFFERENTIAL/PLATELET
BASO%: 1.1 % (ref 0.0–2.0)
BASOS ABS: 0.2 10*3/uL — AB (ref 0.0–0.1)
EOS ABS: 0.3 10*3/uL (ref 0.0–0.5)
EOS%: 1.5 % (ref 0.0–7.0)
HEMATOCRIT: 38.7 % (ref 34.8–46.6)
HEMOGLOBIN: 12.4 g/dL (ref 11.6–15.9)
LYMPH#: 18.2 10*3/uL — AB (ref 0.9–3.3)
LYMPH%: 82.4 % — ABNORMAL HIGH (ref 14.0–49.7)
MCH: 27.9 pg (ref 25.1–34.0)
MCHC: 32 g/dL (ref 31.5–36.0)
MCV: 87 fL (ref 79.5–101.0)
MONO#: 0.3 10*3/uL (ref 0.1–0.9)
MONO%: 1.5 % (ref 0.0–14.0)
NEUT#: 3 10*3/uL (ref 1.5–6.5)
NEUT%: 13.5 % — ABNORMAL LOW (ref 38.4–76.8)
Platelets: 163 10*3/uL (ref 145–400)
RBC: 4.45 10*6/uL (ref 3.70–5.45)
RDW: 14 % (ref 11.2–14.5)
WBC: 22 10*3/uL — ABNORMAL HIGH (ref 3.9–10.3)

## 2014-11-22 LAB — COMPREHENSIVE METABOLIC PANEL (CC13)
ALBUMIN: 3.9 g/dL (ref 3.5–5.0)
ALK PHOS: 97 U/L (ref 40–150)
ALT: 35 U/L (ref 0–55)
AST: 34 U/L (ref 5–34)
Anion Gap: 6 mEq/L (ref 3–11)
BILIRUBIN TOTAL: 0.34 mg/dL (ref 0.20–1.20)
BUN: 14.2 mg/dL (ref 7.0–26.0)
CO2: 28 mEq/L (ref 22–29)
CREATININE: 0.9 mg/dL (ref 0.6–1.1)
Calcium: 9.3 mg/dL (ref 8.4–10.4)
Chloride: 108 mEq/L (ref 98–109)
EGFR: 69 mL/min/{1.73_m2} — AB (ref >90)
GLUCOSE: 89 mg/dL (ref 70–140)
Potassium: 4.1 mEq/L (ref 3.5–5.1)
SODIUM: 142 meq/L (ref 136–145)
TOTAL PROTEIN: 6.2 g/dL — AB (ref 6.4–8.3)

## 2014-11-22 LAB — TECHNOLOGIST REVIEW

## 2014-11-22 LAB — LACTATE DEHYDROGENASE (CC13): LDH: 241 U/L (ref 125–245)

## 2014-11-22 MED ORDER — HEPARIN SOD (PORK) LOCK FLUSH 100 UNIT/ML IV SOLN
500.0000 [IU] | Freq: Once | INTRAVENOUS | Status: AC
  Administered 2014-11-22: 500 [IU] via INTRAVENOUS
  Filled 2014-11-22: qty 5

## 2014-11-22 MED ORDER — SODIUM CHLORIDE 0.9 % IJ SOLN
10.0000 mL | INTRAMUSCULAR | Status: DC | PRN
  Administered 2014-11-22: 10 mL via INTRAVENOUS
  Filled 2014-11-22: qty 10

## 2014-11-22 NOTE — Progress Notes (Signed)
Pt reports port incission and area surrounding port are still very tender. She reports pain medication controls this pain and she requested a refill. Message to Dr. Irene Limbo for refill- colab nurse to bring to pt in the lobby. Pt reported increased discomfort around port site after port de-accessed. Pt requested ice pack to apply to area while waiting.   Went to the lobby to check on patient 20 minutes later and she had left the building.

## 2014-11-22 NOTE — Patient Instructions (Signed)

## 2014-11-23 ENCOUNTER — Telehealth: Payer: Self-pay | Admitting: Oncology

## 2014-11-23 ENCOUNTER — Other Ambulatory Visit: Payer: Self-pay | Admitting: *Deleted

## 2014-11-23 NOTE — Telephone Encounter (Signed)
Per pof patient is aware of 9/30 appointment

## 2014-11-23 NOTE — Progress Notes (Signed)
Received call from patient stating,"I had my port a cath flushed yesterday, and now I'm having itching around the port and a sensation on my skin." Instructed patient that she could be scheduled for [facility] tomorrow at 09:00 am. Patient verbalized understanding. POF sent to schedulers.

## 2014-11-24 ENCOUNTER — Encounter: Payer: Medicare Other | Admitting: Nurse Practitioner

## 2014-11-27 ENCOUNTER — Telehealth: Payer: Self-pay | Admitting: *Deleted

## 2014-11-27 NOTE — Telephone Encounter (Signed)
PT. WOULD LIKE TO CHANGE DR.MAGRINAT'S APPOINTMENT.

## 2014-12-05 ENCOUNTER — Other Ambulatory Visit: Payer: Medicare Other

## 2014-12-19 ENCOUNTER — Other Ambulatory Visit: Payer: Medicare Other

## 2014-12-22 ENCOUNTER — Telehealth: Payer: Self-pay | Admitting: Oncology

## 2014-12-22 NOTE — Telephone Encounter (Signed)
Patient has called and is in [place] and  Has r/s her appointment

## 2014-12-26 ENCOUNTER — Ambulatory Visit: Payer: Medicare Other | Admitting: Oncology

## 2014-12-26 ENCOUNTER — Other Ambulatory Visit: Payer: Medicare Other

## 2015-01-08 ENCOUNTER — Ambulatory Visit (HOSPITAL_BASED_OUTPATIENT_CLINIC_OR_DEPARTMENT_OTHER): Payer: Medicare Other

## 2015-01-08 ENCOUNTER — Encounter: Payer: Self-pay | Admitting: Oncology

## 2015-01-08 ENCOUNTER — Telehealth: Payer: Self-pay | Admitting: Oncology

## 2015-01-08 ENCOUNTER — Ambulatory Visit (HOSPITAL_BASED_OUTPATIENT_CLINIC_OR_DEPARTMENT_OTHER): Payer: Medicare Other | Admitting: Oncology

## 2015-01-08 VITALS — BP 108/54 | HR 82 | Temp 98.1°F | Resp 18 | Ht <= 58 in | Wt 120.3 lb

## 2015-01-08 DIAGNOSIS — D630 Anemia in neoplastic disease: Secondary | ICD-10-CM

## 2015-01-08 DIAGNOSIS — C9110 Chronic lymphocytic leukemia of B-cell type not having achieved remission: Secondary | ICD-10-CM

## 2015-01-08 DIAGNOSIS — R87612 Low grade squamous intraepithelial lesion on cytologic smear of cervix (LGSIL): Secondary | ICD-10-CM

## 2015-01-08 DIAGNOSIS — C9192 Lymphoid leukemia, unspecified, in relapse: Secondary | ICD-10-CM

## 2015-01-08 DIAGNOSIS — Z452 Encounter for adjustment and management of vascular access device: Secondary | ICD-10-CM

## 2015-01-08 DIAGNOSIS — Z95828 Presence of other vascular implants and grafts: Secondary | ICD-10-CM

## 2015-01-08 DIAGNOSIS — C9111 Chronic lymphocytic leukemia of B-cell type in remission: Secondary | ICD-10-CM | POA: Diagnosis not present

## 2015-01-08 DIAGNOSIS — C9112 Chronic lymphocytic leukemia of B-cell type in relapse: Secondary | ICD-10-CM

## 2015-01-08 DIAGNOSIS — R509 Fever, unspecified: Secondary | ICD-10-CM

## 2015-01-08 LAB — COMPREHENSIVE METABOLIC PANEL (CC13)
ALT: 29 U/L (ref 0–55)
ANION GAP: 9 meq/L (ref 3–11)
AST: 29 U/L (ref 5–34)
Albumin: 3.9 g/dL (ref 3.5–5.0)
Alkaline Phosphatase: 116 U/L (ref 40–150)
BUN: 11.7 mg/dL (ref 7.0–26.0)
CHLORIDE: 105 meq/L (ref 98–109)
CO2: 26 meq/L (ref 22–29)
CREATININE: 0.9 mg/dL (ref 0.6–1.1)
Calcium: 9.5 mg/dL (ref 8.4–10.4)
EGFR: 66 mL/min/{1.73_m2} — ABNORMAL LOW (ref >90)
GLUCOSE: 129 mg/dL (ref 70–140)
Potassium: 4 mEq/L (ref 3.5–5.1)
SODIUM: 140 meq/L (ref 136–145)
TOTAL PROTEIN: 6.6 g/dL (ref 6.4–8.3)
Total Bilirubin: 0.38 mg/dL (ref 0.20–1.20)

## 2015-01-08 LAB — CBC WITH DIFFERENTIAL/PLATELET
BASO%: 1 % (ref 0.0–2.0)
BASOS ABS: 0.6 10*3/uL — AB (ref 0.0–0.1)
EOS%: 0.7 % (ref 0.0–7.0)
Eosinophils Absolute: 0.5 10*3/uL (ref 0.0–0.5)
HEMATOCRIT: 43.2 % (ref 34.8–46.6)
HGB: 13.7 g/dL (ref 11.6–15.9)
LYMPH#: 61.5 10*3/uL — AB (ref 0.9–3.3)
LYMPH%: 90.8 % — AB (ref 14.0–49.7)
MCH: 28 pg (ref 25.1–34.0)
MCHC: 31.7 g/dL (ref 31.5–36.0)
MCV: 88.3 fL (ref 79.5–101.0)
MONO#: 0.5 10*3/uL (ref 0.1–0.9)
MONO%: 0.8 % (ref 0.0–14.0)
NEUT#: 4.5 10*3/uL (ref 1.5–6.5)
NEUT%: 6.7 % — AB (ref 38.4–76.8)
PLATELETS: 195 10*3/uL (ref 145–400)
RBC: 4.89 10*6/uL (ref 3.70–5.45)
RDW: 14 % (ref 11.2–14.5)
WBC: 67.6 10*3/uL (ref 3.9–10.3)

## 2015-01-08 LAB — LACTATE DEHYDROGENASE (CC13): LDH: 283 U/L — AB (ref 125–245)

## 2015-01-08 LAB — TECHNOLOGIST REVIEW

## 2015-01-08 MED ORDER — IBRUTINIB 140 MG PO CAPS: 140.0000 mg | ORAL_CAPSULE | Freq: Every day | ORAL | Status: DC

## 2015-01-08 MED ORDER — HEPARIN SOD (PORK) LOCK FLUSH 100 UNIT/ML IV SOLN
500.0000 [IU] | Freq: Once | INTRAVENOUS | Status: AC
  Administered 2015-01-08: 500 [IU] via INTRAVENOUS
  Filled 2015-01-08: qty 5

## 2015-01-08 MED ORDER — SODIUM CHLORIDE 0.9 % IJ SOLN
10.0000 mL | INTRAMUSCULAR | Status: DC | PRN
Start: 2015-01-08 — End: 2015-01-08
  Administered 2015-01-08: 10 mL via INTRAVENOUS
  Filled 2015-01-08: qty 10

## 2015-01-08 NOTE — Progress Notes (Signed)
ID: Rachel Rachel   DOB: 1951/01/21  MR#: [id number]  [id number]  PCP: Rachel Rachel GYN: SU:  OTHER [name]: Rachel Polio M.D.  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 She has been treated with Rituxan, ofatumumab, cladribine, bendamustine and currently with ibrutinib, with details summarized below.  INTERVAL HISTORY: Rachel Rachel returns today for follow up of her chronic lymphoid leukemia. Reports that she has not been taking any ibritunib since her last visit with [place]. From Rachel lymphoma point of view she reports night sweats and decreased appetite. Weight is down by about 3 lbs since her last visit with [place]. Denies fevers, rash, itching, or unexplained fatigue. There has been no adenopathy, but the patient indicates that she does not like to check.  REVIEW OF SYSTEMS: Headaches have resolved since stopping ibrutinib. She has arthritis problems. They involve multiple joints. She has chronic stress urinary incontinence, easy bruising, and some forgetfulness, anxiety, and depression. Hot flashes are mild. Rachel detailed review of systems today was otherwise stable  PAST MEDICAL HISTORY: Past Medical History  Diagnosis Date  . Cancer ([place])   . Leukemia ([place])   . Lower back pain     PAST SURGICAL HISTORY: Past Surgical History  Procedure Laterality Date  . Tonsillectomy    . Other surgical history      removal of uterine lining  . Lining of uterus removed    . Bunionectomy      right foot  . Breast surgery  2015    left breast bx    FAMILY HISTORY Family History  Problem Relation Age of Onset  . Depression [name]     GYNECOLOGIC HISTORY: Menarche age 26, first live birth age 40, she is [place] P3. She stopped having periods in 1999 after endometrial stripping. She continues on hormone  replacement.  SOCIAL HISTORY: Her husband [place] Rachel is retired from [employer]. Her [name] Rachel Rachel lives in [place] and has 3 sons; he works for [place] [employer], community. Rachel Rachel lives in [place] and has two children. [name] Rachel Rachel lives in [place] and has [place], as well as 2 sons by marriage.   ADVANCED DIRECTIVES: In place  HEALTH MAINTENANCE: Social History  Substance Use Topics  . Smoking status: Never Smoker   . Smokeless tobacco: Never Used  . Alcohol Use: Yes     Comment: socially     Colonoscopy: due  PAP: December 2012/ [name]  Bone density: Due  Lipid panel: per Dr Drema Rachel  Allergies  Allergen Reactions  . Penicillins Anaphylaxis    "stopped breathing" "drops my heartbeat" "I just pass out"    Current Outpatient Prescriptions  Medication Sig Dispense Refill  . acetaminophen (TYLENOL) 500 MG tablet Take 500 mg by mouth every 6 (six) hours as needed for headache.    Marland Kitchen acyclovir (ZOVIRAX) 400 MG tablet Take 1 tablet (400 mg total) by mouth 2 (two) times daily. 180 tablet 4  . aspirin 81 MG tablet Take 81 mg by mouth daily.    . Calcium Citrate (CITRACAL PO) Take 4 tablets by mouth daily.     . cholecalciferol (VITAMIN D) 1000 UNITS tablet Take 4,000 Units by mouth daily.    Marland Kitchen ELESTRIN 0.52 MG/0.87 GM (0.06%) GEL APPLY 2 PUMPS THINLY TO UPPER ARM DAILY AT BEDTIME 156 g 3  . lidocaine-prilocaine (EMLA) cream Apply Rachel nickel size  amount to skin on top of port- cover with saran wrap at least 1 hour before access 30 g 0  . progesterone (PROMETRIUM) 200 MG capsule TAKE 1 CAPSULE ON DAYS 1 THROUGH 12 EVERY OTHER MONTH 24 capsule 2  . traZODone (DESYREL) 50 MG tablet Take 50 mg by mouth at bedtime.    Marland Kitchen ibrutinib (IMBRUVICA) 140 MG capsul Take 1 capsule (140 mg total) by mouth daily. 45 capsule 1   No current facility-administered medications for this visit.    OBJECTIVE: Middle-aged white woman in no acute distress Filed Vitals:   01/08/15 1122  BP: 108/54  Pulse: 82   Temp: 98.1 F (36.7 C)  Resp: 18   Filed Weights   01/08/15 1122  Weight: 120 lb 4.8 oz (54.568 kg)   Body mass index is 25.15 kg/(m^2).    ECOG FS: 0  Sclerae unicteric, pupils round and equal Oropharynx clear and moist-- no thrush or other lesions No cervical or supraclavicular adenopathy Lungs no rales or rhonchi Heart regular rate and rhythm Abd soft, nontender, positive bowel sounds MSK no focal spinal tenderness, no upper extremity lymphedema Neuro: nonfocal, well oriented, appropriate affect Breasts: Deferred   LAB RESULTS:  Lab Results  Component Value Date   Rachel 67.6* 01/08/2015   NEUTROABS 4.5 01/08/2015   HGB 13.7 01/08/2015   HCT 43.2 01/08/2015   MCV 88.3 01/08/2015   PLT 195 01/08/2015      Chemistry      Component Value Date/Time   NA 140 01/08/2015 1055   NA 139 02/28/2014 0525   K 4.0 01/08/2015 1055   K 3.6 02/28/2014 0525   CL 115* 02/28/2014 0525   CL 101 12/15/2011 1322   CO2 26 01/08/2015 1055   CO2 22 02/28/2014 0525   BUN 11.7 01/08/2015 1055   BUN 10 02/28/2014 0525   CREATININE 0.9 01/08/2015 1055   CREATININE 0.61 02/28/2014 0525      Component Value Date/Time   CALCIUM 9.5 01/08/2015 1055   CALCIUM 7.2* 02/28/2014 0525   ALKPHOS 116 01/08/2015 1055   ALKPHOS 121* 02/27/2014 1705   AST 29 01/08/2015 1055   AST 142* 02/27/2014 1705   ALT 29 01/08/2015 1055   ALT 71* 02/27/2014 1705   BILITOT 0.38 01/08/2015 1055   BILITOT 0.2* 02/27/2014 1705      Results for Rachel Rachel, Rachel Rachel (MRN [id number]) as of 01/08/2015 13:40  Ref. Range 10/24/2014 13:04 11/07/2014 13:04 11/22/2014 13:12 01/08/2015 10:54  lymph# Latest Ref Range: 0.9-3.3 10e3/uL 12.8 (H) 18.5 (H) 18.2 (H) 61.5 (H)   STUDIES: No results found.   ASSESSMENT: 64 y.o.  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 rigors 02/27/2014: received IV vancomycin and levaquin briefly, then levaquin po (pcn allergy)  (6) bendamustine/ rituximab for 6 cycles, completed 05/04/2014, with improvement but not normalization of the absolute lymphocyte count  (7) ibrutinib started 05/08/2014, taken with some irregularity, stopped 10/02/2014 with  neutropenia  PLAN: Rachel Rachel is up to 67,000 today. Discussed with Dr. Jana Hakim and recommend that the patient go back on treatment. The patient was given the option of restarting ibrutinib 1 tablet daily vs. Rituxan which did not work well for her in the past. The patient has opted to restart Ibrutinib 1 tablet daily. She has some at home to start, but I have sent Rachel refill to her mail order pharmacy. I have instructed the patient to call us if she does not hear form her pharmacy in the next 48 hours.   She will follow-up in 2-3 weeks to check counts and see how she is tolerating the medication.  She is to call us in the interim for questions or concerns.   Mikey Bussing, [place] [phone] 01/08/2015

## 2015-01-08 NOTE — Telephone Encounter (Signed)
per pof to sch pt appt-gqave pt copy of avs °

## 2015-01-08 NOTE — Progress Notes (Signed)
Critical lab:  WBC 67.6  Mikey Bussing, NP is aware.

## 2015-01-15 ENCOUNTER — Telehealth: Payer: Self-pay | Admitting: *Deleted

## 2015-01-15 NOTE — Telephone Encounter (Signed)
Per MD review recommendation is to hold Imbruvica until next week- Teriana is already scheduled for lab and HB/NP.  Informed pt who verbalized understanding.

## 2015-01-15 NOTE — Telephone Encounter (Signed)
Rachel [name] called to this RN to state onset over the weekend of noted areas on her arms- legs of " red blotches "  Pt uses dove soap- or aveeno.  Areas start small " like a mosquito bite " but then get bigger ".  Mild itching noted.  Only other issue is a stye on her eye.  Pt unable to come in today " I am babysitting "-  This note will be reviewed with MD for further recommendations.

## 2015-01-22 ENCOUNTER — Other Ambulatory Visit (HOSPITAL_BASED_OUTPATIENT_CLINIC_OR_DEPARTMENT_OTHER): Payer: Medicare Other

## 2015-01-22 ENCOUNTER — Other Ambulatory Visit: Payer: Self-pay | Admitting: Nurse Practitioner

## 2015-01-22 ENCOUNTER — Ambulatory Visit (HOSPITAL_BASED_OUTPATIENT_CLINIC_OR_DEPARTMENT_OTHER): Payer: Medicare Other | Admitting: Nurse Practitioner

## 2015-01-22 ENCOUNTER — Encounter: Payer: Self-pay | Admitting: Nurse Practitioner

## 2015-01-22 ENCOUNTER — Telehealth: Payer: Self-pay | Admitting: *Deleted

## 2015-01-22 VITALS — BP 110/67 | HR 74 | Temp 98.1°F | Resp 18 | Wt 121.4 lb

## 2015-01-22 DIAGNOSIS — D630 Anemia in neoplastic disease: Secondary | ICD-10-CM

## 2015-01-22 DIAGNOSIS — C9192 Lymphoid leukemia, unspecified, in relapse: Secondary | ICD-10-CM

## 2015-01-22 DIAGNOSIS — C9110 Chronic lymphocytic leukemia of B-cell type not having achieved remission: Secondary | ICD-10-CM

## 2015-01-22 DIAGNOSIS — R519 Headache, unspecified: Secondary | ICD-10-CM

## 2015-01-22 DIAGNOSIS — R51 Headache: Secondary | ICD-10-CM | POA: Diagnosis not present

## 2015-01-22 DIAGNOSIS — R87612 Low grade squamous intraepithelial lesion on cytologic smear of cervix (LGSIL): Secondary | ICD-10-CM

## 2015-01-22 DIAGNOSIS — G8929 Other chronic pain: Secondary | ICD-10-CM

## 2015-01-22 DIAGNOSIS — R509 Fever, unspecified: Secondary | ICD-10-CM

## 2015-01-22 DIAGNOSIS — C9112 Chronic lymphocytic leukemia of B-cell type in relapse: Secondary | ICD-10-CM

## 2015-01-22 DIAGNOSIS — C9111 Chronic lymphocytic leukemia of B-cell type in remission: Secondary | ICD-10-CM | POA: Diagnosis present

## 2015-01-22 LAB — TECHNOLOGIST REVIEW

## 2015-01-22 LAB — COMPREHENSIVE METABOLIC PANEL (CC13)
ALT: 36 U/L (ref 0–55)
AST: 34 U/L (ref 5–34)
Albumin: 3.8 g/dL (ref 3.5–5.0)
Alkaline Phosphatase: 84 U/L (ref 40–150)
Anion Gap: 7 mEq/L (ref 3–11)
BUN: 8.8 mg/dL (ref 7.0–26.0)
CHLORIDE: 107 meq/L (ref 98–109)
CO2: 26 mEq/L (ref 22–29)
Calcium: 8.9 mg/dL (ref 8.4–10.4)
Creatinine: 0.9 mg/dL (ref 0.6–1.1)
EGFR: 67 mL/min/{1.73_m2} — ABNORMAL LOW (ref >90)
GLUCOSE: 89 mg/dL (ref 70–140)
POTASSIUM: 4.2 meq/L (ref 3.5–5.1)
SODIUM: 140 meq/L (ref 136–145)
Total Bilirubin: 0.46 mg/dL (ref 0.20–1.20)
Total Protein: 6.5 g/dL (ref 6.4–8.3)

## 2015-01-22 LAB — CBC WITH DIFFERENTIAL/PLATELET
BASO%: 0.8 % (ref 0.0–2.0)
Basophils Absolute: 0.2 10*3/uL — ABNORMAL HIGH (ref 0.0–0.1)
EOS ABS: 0.2 10*3/uL (ref 0.0–0.5)
EOS%: 0.8 % (ref 0.0–7.0)
HEMATOCRIT: 39.3 % (ref 34.8–46.6)
HGB: 12.7 g/dL (ref 11.6–15.9)
LYMPH#: 21 10*3/uL — AB (ref 0.9–3.3)
LYMPH%: 82.5 % — AB (ref 14.0–49.7)
MCH: 28.3 pg (ref 25.1–34.0)
MCHC: 32.3 g/dL (ref 31.5–36.0)
MCV: 87.6 fL (ref 79.5–101.0)
MONO#: 1.2 10*3/uL — AB (ref 0.1–0.9)
MONO%: 4.7 % (ref 0.0–14.0)
NEUT%: 11.2 % — AB (ref 38.4–76.8)
NEUTROS ABS: 2.9 10*3/uL (ref 1.5–6.5)
PLATELETS: 136 10*3/uL — AB (ref 145–400)
RBC: 4.49 10*6/uL (ref 3.70–5.45)
RDW: 14.2 % (ref 11.2–14.5)
WBC: 25.4 10*3/uL — AB (ref 3.9–10.3)

## 2015-01-22 LAB — LACTATE DEHYDROGENASE (CC13): LDH: 233 U/L (ref 125–245)

## 2015-01-22 NOTE — Telephone Encounter (Signed)
Received call from pt stating that she needed the results of her WBC since she is to start imbruvica today.  Returned call to pt & gave results of CBC.  She is concerned that [facility] dropped so much from last time & doesn't understand why & not sure if she should start imbruvica.  Informed that I would send a message to Dr Jana Hakim to call her.  She can be reached at [phone].

## 2015-01-22 NOTE — Progress Notes (Signed)
ID: Rosaleigh Rementer Ferrando   DOB: [birth year]  MR#: [id number]  [id number]  PCP: [name] Md GYN: SU:  OTHER MD: [name] M.D.  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 She has been treated with Rituxan, ofatumumab, cladribine, bendamustine and currently with ibrutinib, with details summarized below.  INTERVAL HISTORY: [name] returns today for follow up of her chronic lymphoid leukemia. She was advised to stop her ibritunib last week due to a new rash on her arms and leg that looked like large itchy red blotches. She does not believe it was the ibritunib, but a new soap she was using. She went back to dove soap and the rash resolved.   REVIEW OF SYSTEMS: [name] denies fevers or chills. She is gaining some of her weight back as her appetite increases. She still has some night sweats. She is fatigued, but has recently started back up her exercise routine with zumba 2 days ago. Her headaches are off and on but occur every day. She does not take anything for them. Generally they are nuchal headaches, but recently she has had some sharp frontal headaches too. She complains of post nasal drip and is doing to start an antihistamine today in case this is sinus related. The drip causes some nausea. She has about 3 loose bowel movements daily, but is slow to call that constipation. She uses imodium as needed. She has arthritis problems. They involve multiple joints. She has chronic stress urinary incontinence, easy bruising, and some forgetfulness, anxiety, and depression. Hot flashes are mild. A detailed review of systems today was otherwise stable  PAST MEDICAL HISTORY: Past Medical History  Diagnosis Date  . Cancer ([place])   . Leukemia ([place])   . Lower back pain     PAST SURGICAL HISTORY: Past Surgical History   Procedure Laterality Date  . Tonsillectomy    . Other surgical history      removal of uterine lining  . Lining of uterus removed    . Bunionectomy      right foot  . Breast surgery  2015    left breast bx    FAMILY HISTORY Family History  Problem Relation Age of Onset  . Depression Son     GYNECOLOGIC HISTORY: Menarche age 26, first live birth age 1, she is [place] P3. She stopped having periods in 1999 after endometrial stripping. She continues on hormone replacement.  SOCIAL HISTORY: [name] is retired from [employer]. Her son [name] lives in [place] and has 3 sons; he works for a Pharmacist, community. A second son lives in [place] and has two children. Son [name] lives in [place] and has a daughter, as well as 2 sons by marriage.   ADVANCED DIRECTIVES: In place  HEALTH MAINTENANCE: Social History  Substance Use Topics  . Smoking status: Never Smoker   . Smokeless tobacco: Never Used  . Alcohol Use: Yes     Comment: socially     Colonoscopy: due  PAP: December 2012/ [name]  Bone density: Due  Lipid panel: per Dr Drema Dallas  Allergies  Allergen Reactions  . Penicillins Anaphylaxis    "stopped breathing" "drops my heartbeat" "I just pass out"    Current Outpatient Prescriptions  Medication Sig Dispense Refill  . acetaminophen (TYLENOL) 500 MG tablet Take 500 mg by mouth every 6 (six) hours as needed for headache.    Marland Kitchen  acyclovir (ZOVIRAX) 400 MG tablet Take 1 tablet (400 mg total) by mouth 2 (two) times daily. 180 tablet 4  . aspirin 81 MG tablet Take 81 mg by mouth daily.    . Calcium Citrate (CITRACAL PO) Take 4 tablets by mouth daily.     . cholecalciferol (VITAMIN D) 1000 UNITS tablet Take 4,000 Units by mouth daily.    Marland Kitchen ELESTRIN 0.52 MG/0.87 GM (0.06%) GEL APPLY 2 PUMPS THINLY TO UPPER ARM DAILY AT BEDTIME 156 g 3  . lidocaine-prilocaine (EMLA) cream Apply a nickel size amount to skin on top of port- cover with saran wrap at least 1 hour before  access 30 g 0  . progesterone (PROMETRIUM) 200 MG capsule TAKE 1 CAPSULE ON DAYS 1 THROUGH 12 EVERY OTHER MONTH 24 capsule 2  . traZODone (DESYREL) 50 MG tablet Take 50 mg by mouth at bedtime.    Marland Kitchen ibrutinib (IMBRUVICA) 140 MG capsul Take 1 capsule (140 mg total) by mouth daily. (Patient not taking: Reported on 01/22/2015) 45 capsule 1   No current facility-administered medications for this visit.    OBJECTIVE: Middle-aged white woman in no acute distress Filed Vitals:   01/22/15 0905  BP: 110/67  Pulse: 74  Temp: 98.1 F (36.7 C)  Resp: 18   Filed Weights   01/22/15 0905  Weight: 121 lb 6.4 oz (55.067 kg)   Body mass index is 25.38 kg/(m^2).    ECOG FS: 0  Skin: warm, dry  HEENT: sclerae anicteric, conjunctivae pink, oropharynx clear. No thrush or mucositis.  Lymph Nodes: No cervical or supraclavicular lymphadenopathy  Lungs: clear to auscultation bilaterally, no rales, wheezes, or rhonci  Heart: regular rate and rhythm  Abdomen: round, soft, non tender, positive bowel sounds  Musculoskeletal: No focal spinal tenderness, no peripheral edema  Neuro: non focal, well oriented, positive affect     LAB RESULTS:  Lab Results  Component Value Date   WBC 67.6* 01/08/2015   NEUTROABS 4.5 01/08/2015   HGB 13.7 01/08/2015   HCT 43.2 01/08/2015   MCV 88.3 01/08/2015   PLT 195 01/08/2015      Chemistry      Component Value Date/Time   NA 140 01/08/2015 1055   NA 139 02/28/2014 0525   K 4.0 01/08/2015 1055   K 3.6 02/28/2014 0525   CL 115* 02/28/2014 0525   CL 101 12/15/2011 1322   CO2 26 01/08/2015 1055   CO2 22 02/28/2014 0525   BUN 11.7 01/08/2015 1055   BUN 10 02/28/2014 0525   CREATININE 0.9 01/08/2015 1055   CREATININE 0.61 02/28/2014 0525      Component Value Date/Time   CALCIUM 9.5 01/08/2015 1055   CALCIUM 7.2* 02/28/2014 0525   ALKPHOS 116 01/08/2015 1055   ALKPHOS 121* 02/27/2014 1705   AST 29 01/08/2015 1055   AST 142* 02/27/2014 1705   ALT 29  01/08/2015 1055   ALT 71* 02/27/2014 1705   BILITOT 0.38 01/08/2015 1055   BILITOT 0.2* 02/27/2014 1705      Results for KIRRILY, LASETER A (MRN [id number]) as of 01/08/2015 13:40  Ref. Range 10/24/2014 13:04 11/07/2014 13:04 11/22/2014 13:12 01/08/2015 10:54  lymph# Latest Ref Range: 0.9-3.3 10e3/uL 12.8 (H) 18.5 (H) 18.2 (H) 61.5 (H)   STUDIES: No results found.   ASSESSMENT: 64 y.o.  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 rigors 02/27/2014: received IV vancomycin and levaquin briefly, then levaquin po (pcn allergy)  (6) bendamustine/ rituximab for 6 cycles, completed 05/04/2014, with improvement but not normalization of the absolute lymphocyte count  (7) ibrutinib started 05/08/2014, taken with some irregularity, stopped 10/02/2014 with neutropenia  PLAN: [name] is stable today. Now that her rash is gone, and her headaches are less intense, she is ready to restart ibruntinib. I consulted with Dr. Jana Hakim, and he will allow this if her counts are sufficient. I sent her back to the lab after our visit to have this accomplished.   At this time she is not interested in any medications, OTC or prescription alike, to manage her headaches.   [name] will return in early January for follow up with Dr. Jana Hakim. She understands and agrees with this plan. She  knows the goal of treatment in her case is control. She has been encouraged to call with any issues that might arise before her next visit here.   Laurie Panda, NP 01/22/2015   ADDENDUM:  WBC returned at 25.4. Patient was called and advised to restart ibrutinib at 1 tablet daily.

## 2015-01-24 ENCOUNTER — Telehealth: Payer: Self-pay | Admitting: Oncology

## 2015-01-24 NOTE — Telephone Encounter (Signed)
s.wk. pt and advised on DEC and Jan 2017 appt...Marland KitchenMarland KitchenMarland Kitchenpt ok and aware

## 2015-02-01 ENCOUNTER — Other Ambulatory Visit: Payer: Self-pay | Admitting: Nurse Practitioner

## 2015-02-01 ENCOUNTER — Telehealth: Payer: Self-pay | Admitting: Medical Oncology

## 2015-02-01 ENCOUNTER — Telehealth: Payer: Self-pay

## 2015-02-01 NOTE — Telephone Encounter (Signed)
Pt reports she feels very anxious today "like something is wrong. My BP is 114/71, my pulse is 86, I feel tightness in my upper body, having some chills and feel lightheaded. Mentally i am at peace  I just feel very anxious" . She denies pain , nausea. She ate at [employer] today and is drinking lots of water.I asked her if she is taking Imbruvica and she said "no, just my regular meds".  Spoke to [place] and she will talk to Dr Jana Hakim.

## 2015-02-01 NOTE — Telephone Encounter (Signed)
Writer called patient back who reports that she has been feeling like "she is going to faint" all day despite eating and drinking well.  Her BP is 114/71, P-86.  She did attend a Zumba class yesterday and became dizzy and lightheaded but states she will not give up exercise.   Patient stated that she is trying to let go of a lot of her anxiety by trying to pray whenever she wakes up in the middle of the night.  She also states that she is not going to let her husband "disrupt her life".  She is putting herself first and is not going to engage with her husband if he is not being nice to her.  She also mentioned that a foreign exchange student from [place] is living with them and she would like him to "leave" and get her space back but knows he cannot because he is a friend of the family and she is committed to having him stay with them until May.  It seems patient is dealing with some stressful situations. Patient will call back to update if her symptoms continue.

## 2015-02-02 ENCOUNTER — Other Ambulatory Visit: Payer: Self-pay | Admitting: Nurse Practitioner

## 2015-02-08 ENCOUNTER — Other Ambulatory Visit (HOSPITAL_BASED_OUTPATIENT_CLINIC_OR_DEPARTMENT_OTHER): Payer: Medicare Other

## 2015-02-08 DIAGNOSIS — C9110 Chronic lymphocytic leukemia of B-cell type not having achieved remission: Secondary | ICD-10-CM | POA: Diagnosis present

## 2015-02-08 LAB — CBC WITH DIFFERENTIAL/PLATELET
BASO%: 0.6 % (ref 0.0–2.0)
BASOS ABS: 0.3 10*3/uL — AB (ref 0.0–0.1)
EOS%: 0.9 % (ref 0.0–7.0)
Eosinophils Absolute: 0.5 10*3/uL (ref 0.0–0.5)
HEMATOCRIT: 42.5 % (ref 34.8–46.6)
HGB: 13.5 g/dL (ref 11.6–15.9)
LYMPH%: 91.8 % — ABNORMAL HIGH (ref 14.0–49.7)
MCH: 28.3 pg (ref 25.1–34.0)
MCHC: 31.8 g/dL (ref 31.5–36.0)
MCV: 89 fL (ref 79.5–101.0)
MONO#: 0.2 10*3/uL (ref 0.1–0.9)
MONO%: 0.5 % (ref 0.0–14.0)
NEUT#: 3.1 10*3/uL (ref 1.5–6.5)
NEUT%: 6.2 % — AB (ref 38.4–76.8)
Platelets: 203 10*3/uL (ref 145–400)
RBC: 4.77 10*6/uL (ref 3.70–5.45)
RDW: 13.8 % (ref 11.2–14.5)
WBC: 50.1 10*3/uL — ABNORMAL HIGH (ref 3.9–10.3)
lymph#: 46 10*3/uL — ABNORMAL HIGH (ref 0.9–3.3)

## 2015-02-08 LAB — COMPREHENSIVE METABOLIC PANEL
ALT: 30 U/L (ref 0–55)
ANION GAP: 8 meq/L (ref 3–11)
AST: 31 U/L (ref 5–34)
Albumin: 4 g/dL (ref 3.5–5.0)
Alkaline Phosphatase: 101 U/L (ref 40–150)
BUN: 12.1 mg/dL (ref 7.0–26.0)
CHLORIDE: 106 meq/L (ref 98–109)
CO2: 26 meq/L (ref 22–29)
Calcium: 9.8 mg/dL (ref 8.4–10.4)
Creatinine: 1.1 mg/dL (ref 0.6–1.1)
EGFR: 55 mL/min/{1.73_m2} — AB (ref >90)
GLUCOSE: 89 mg/dL (ref 70–140)
POTASSIUM: 4.3 meq/L (ref 3.5–5.1)
SODIUM: 141 meq/L (ref 136–145)
Total Bilirubin: 0.37 mg/dL (ref 0.20–1.20)
Total Protein: 7 g/dL (ref 6.4–8.3)

## 2015-02-08 LAB — LACTATE DEHYDROGENASE: LDH: 260 U/L — AB (ref 125–245)

## 2015-02-08 LAB — TECHNOLOGIST REVIEW

## 2015-02-18 ENCOUNTER — Emergency Department (HOSPITAL_COMMUNITY): Payer: Medicare Other

## 2015-02-18 ENCOUNTER — Emergency Department (HOSPITAL_COMMUNITY)
Admission: EM | Admit: 2015-02-18 | Discharge: 2015-02-19 | Disposition: A | Discharge status: Home / Self Care | Payer: Medicare Other | Attending: Emergency Medicine | Admitting: Emergency Medicine

## 2015-02-18 ENCOUNTER — Encounter (HOSPITAL_COMMUNITY): Payer: Self-pay | Admitting: Oncology

## 2015-02-18 DIAGNOSIS — Z880 Allergy status to penicillin: Secondary | ICD-10-CM | POA: Diagnosis not present

## 2015-02-18 DIAGNOSIS — Z7982 Long term (current) use of aspirin: Secondary | ICD-10-CM | POA: Diagnosis not present

## 2015-02-18 DIAGNOSIS — K5792 Diverticulitis of intestine, part unspecified, without perforation or abscess without bleeding: Secondary | ICD-10-CM

## 2015-02-18 DIAGNOSIS — R1013 Epigastric pain: Secondary | ICD-10-CM | POA: Diagnosis present

## 2015-02-18 DIAGNOSIS — K5730 Diverticulosis of large intestine without perforation or abscess without bleeding: Secondary | ICD-10-CM | POA: Diagnosis not present

## 2015-02-18 DIAGNOSIS — Z859 Personal history of malignant neoplasm, unspecified: Secondary | ICD-10-CM | POA: Insufficient documentation

## 2015-02-18 DIAGNOSIS — Z79899 Other long term (current) drug therapy: Secondary | ICD-10-CM | POA: Insufficient documentation

## 2015-02-18 DIAGNOSIS — Z856 Personal history of leukemia: Secondary | ICD-10-CM | POA: Diagnosis not present

## 2015-02-18 LAB — URINALYSIS, ROUTINE W REFLEX MICROSCOPIC
BILIRUBIN URINE: NEGATIVE
GLUCOSE, UA: NEGATIVE mg/dL
KETONES UR: NEGATIVE mg/dL
NITRITE: NEGATIVE
PH: 7.5 (ref 5.0–8.0)
PROTEIN: NEGATIVE mg/dL
Specific Gravity, Urine: 1.003 — ABNORMAL LOW (ref 1.005–1.030)

## 2015-02-18 LAB — COMPREHENSIVE METABOLIC PANEL
ALT: 32 U/L (ref 14–54)
AST: 36 U/L (ref 15–41)
Albumin: 3.9 g/dL (ref 3.5–5.0)
Alkaline Phosphatase: 90 U/L (ref 38–126)
Anion gap: 9 (ref 5–15)
BUN: 10 mg/dL (ref 6–20)
CO2: 25 mmol/L (ref 22–32)
Calcium: 9 mg/dL (ref 8.9–10.3)
Chloride: 101 mmol/L (ref 101–111)
Creatinine, Ser: 0.91 mg/dL (ref 0.44–1.00)
GFR calc Af Amer: >60 mL/min (ref >60)
GFR calc non Af Amer: >60 mL/min (ref >60)
Glucose, Bld: 168 mg/dL — ABNORMAL HIGH (ref 65–99)
Potassium: 4.2 mmol/L (ref 3.5–5.1)
Sodium: 135 mmol/L (ref 135–145)
Total Bilirubin: 1 mg/dL (ref 0.3–1.2)
Total Protein: 6.3 g/dL — ABNORMAL LOW (ref 6.5–8.1)

## 2015-02-18 LAB — URINE MICROSCOPIC-ADD ON

## 2015-02-18 LAB — CBC
HEMATOCRIT: 38.6 % (ref 36.0–46.0)
Hemoglobin: 12.4 g/dL (ref 12.0–15.0)
MCH: 29.2 pg (ref 26.0–34.0)
MCHC: 32.1 g/dL (ref 30.0–36.0)
MCV: 91 fL (ref 78.0–100.0)
PLATELETS: 151 10*3/uL (ref 150–400)
RBC: 4.24 MIL/uL (ref 3.87–5.11)
RDW: 13.6 % (ref 11.5–15.5)
WBC: 62.2 10*3/uL (ref 4.0–10.5)

## 2015-02-18 LAB — LIPASE, BLOOD: Lipase: 27 U/L (ref 11–51)

## 2015-02-18 MED ORDER — SODIUM CHLORIDE 0.9 % IV BOLUS (SEPSIS)
1000.0000 mL | Freq: Once | INTRAVENOUS | Status: AC
  Administered 2015-02-19: 1000 mL via INTRAVENOUS

## 2015-02-18 MED ORDER — ONDANSETRON HCL 4 MG/2ML IJ SOLN
4.0000 mg | Freq: Once | INTRAMUSCULAR | Status: AC
  Administered 2015-02-18: 4 mg via INTRAVENOUS
  Filled 2015-02-18: qty 2

## 2015-02-18 MED ORDER — MORPHINE SULFATE (PF) 4 MG/ML IV SOLN
4.0000 mg | Freq: Once | INTRAVENOUS | Status: AC
  Administered 2015-02-18: 4 mg via INTRAVENOUS
  Filled 2015-02-18: qty 1

## 2015-02-18 MED ORDER — IOHEXOL 300 MG/ML  SOLN
100.0000 mL | Freq: Once | INTRAMUSCULAR | Status: AC | PRN
  Administered 2015-02-18: 100 mL via INTRAVENOUS

## 2015-02-18 MED ORDER — CIPROFLOXACIN IN D5W 400 MG/200ML IV SOLN
400.0000 mg | Freq: Once | INTRAVENOUS | Status: AC
  Administered 2015-02-19: 400 mg via INTRAVENOUS
  Filled 2015-02-18: qty 200

## 2015-02-18 MED ORDER — IOHEXOL 300 MG/ML  SOLN
25.0000 mL | Freq: Once | INTRAMUSCULAR | Status: AC | PRN
  Administered 2015-02-18: 25 mL via ORAL

## 2015-02-18 MED ORDER — METRONIDAZOLE IN NACL 5-0.79 MG/ML-% IV SOLN
500.0000 mg | Freq: Once | INTRAVENOUS | Status: AC
  Administered 2015-02-19: 500 mg via INTRAVENOUS
  Filled 2015-02-18: qty 100

## 2015-02-18 NOTE — ED Notes (Signed)
Pt requested for blood draws thru port cath.

## 2015-02-18 NOTE — ED Notes (Signed)
Pt presents d/t RLQ abdominal pain since last night.  Pt denies N/V/D.  Denies urinary sx.  +fever.

## 2015-02-18 NOTE — ED Provider Notes (Signed)
CSN: [id number]     Arrival date & time 02/18/15  1959 History   First MD Initiated Contact with Patient 02/18/15 2120     Chief Complaint  Patient presents with  . Abdominal Pain     (Consider location/radiation/quality/duration/timing/severity/associated sxs/prior Treatment) HPI   Patient is a 63 year old female with past medical history CLL who presents to the ED with complaint of abdominal pain, onset last night. Patient reports having intermittent throbbing pain to her epigastric region. She notes when she woke up this morning she began having constant worsening pain to her right lower quadrant. She notes pain is worse with movement, denies any alleviating factors. Endorses associated headache and fever, she states she has been taking Motrin at home. Denies cough, SOB, CP, N/V/D, urinary sxs, vaginal bleeding, vaginal d/c. Denies hx of abdominal surgeries.   Past Medical History  Diagnosis Date  . Cancer ([place])   . Leukemia ([place])   . Lower back pain    Past Surgical History  Procedure Laterality Date  . Tonsillectomy    . Other surgical history      removal of uterine lining  . Lining of uterus removed    . Bunionectomy      right foot  . Breast surgery  2015    left breast bx   Family History  Problem Relation Age of Onset  . Depression Son    Social History  Substance Use Topics  . Smoking status: Never Smoker   . Smokeless tobacco: Never Used  . Alcohol Use: Yes     Comment: socially   OB History    No data available     Review of Systems  Constitutional: Positive for fever.  Gastrointestinal: Positive for abdominal pain.  All other systems reviewed and are negative.     Allergies  Penicillins  Home Medications   Prior to Admission medications   Medication Sig Start Date End Date Taking? Authorizing Provider  acetaminophen (TYLENOL) 500 MG tablet Take 500 mg by mouth every 6 (six) hours as needed for headache.    Historical Provider, MD  acyclovir  (ZOVIRAX) 400 MG tablet Take 1 tablet (400 mg total) by mouth 2 (two) times daily. 05/29/14   Chauncey Cruel, MD  aspirin 81 MG tablet Take 81 mg by mouth daily.    Historical Provider, MD  Calcium Citrate (CITRACAL PO) Take 4 tablets by mouth daily.     Historical Provider, MD  cholecalciferol (VITAMIN D) 1000 UNITS tablet Take 4,000 Units by mouth daily.    Historical Provider, MD  ELESTRIN 0.52 MG/0.87 GM (0.06%) GEL APPLY 2 PUMPS THINLY TO UPPER ARM DAILY AT BEDTIME 07/28/14   [name], MD  ibrutinib (IMBRUVICA) 140 MG capsul Take 1 capsule (140 mg total) by mouth daily. Patient not taking: Reported on 01/22/2015 01/08/15   Chauncey Cruel, MD  lidocaine-prilocaine (EMLA) cream Apply a nickel size amount to skin on top of port- cover with saran wrap at least 1 hour before access 03/02/14   Chauncey Cruel, MD  ondansetron (ZOFRAN ODT) 4 MG disintegrating tablet Take 1 tablet (4 mg total) by mouth every 8 (eight) hours as needed for nausea or vomiting. 02/19/15   Nona Dell, PA-C  oxyCODONE-acetaminophen (PERCOCET/ROXICET) 5-325 MG tablet Take 2 tablets by mouth every 4 (four) hours as needed for severe pain. 02/19/15   Nona Dell, PA-C  progesterone (PROMETRIUM) 200 MG capsule TAKE 1 CAPSULE ON DAYS 1 THROUGH 12 EVERY OTHER MONTH  05/19/14   [name], MD  traZODone (DESYREL) 50 MG tablet Take 50 mg by mouth at bedtime.    Historical Provider, MD   BP 93/47 mmHg  Pulse 81  Temp(Src) 100.5 F (38.1 C) (Oral)  Resp 18  Ht 4\' 10"  (1.473 m)  Wt 54.432 kg  BMI 25.09 kg/m2  SpO2 92% Physical Exam  Constitutional: She is oriented to person, place, and time. She appears well-developed and well-nourished.  Pt appears to be in mild discomfort.  HENT:  Head: Normocephalic and atraumatic.  Mouth/Throat: Oropharynx is clear and moist. No oropharyngeal exudate.  Eyes: Conjunctivae and EOM are normal. Right eye exhibits no discharge. Left eye exhibits no  discharge. No scleral icterus.  Neck: Normal range of motion. Neck supple.  Cardiovascular: Normal rate, regular rhythm, normal heart sounds and intact distal pulses.   Pulmonary/Chest: Effort normal and breath sounds normal. No respiratory distress. She has no wheezes. She has no rales. She exhibits no tenderness.  Abdominal: Soft. Bowel sounds are normal. She exhibits no distension and no mass. There is tenderness in the right lower quadrant. There is no rigidity, no rebound and no guarding.  Musculoskeletal: She exhibits no edema.  Lymphadenopathy:    She has no cervical adenopathy.  Neurological: She is alert and oriented to person, place, and time.  Skin: Skin is warm and dry.  Nursing note and vitals reviewed.   ED Course  Procedures (including critical care time) Labs Review Labs Reviewed  COMPREHENSIVE METABOLIC PANEL - Abnormal; Notable for the following:    Glucose, Bld 168 (*)    Total Protein 6.3 (*)    All other components within normal limits  CBC - Abnormal; Notable for the following:    WBC 62.2 (*)    All other components within normal limits  URINALYSIS, ROUTINE W REFLEX MICROSCOPIC (NOT AT [facility]) - Abnormal; Notable for the following:    Specific Gravity, Urine 1.003 (*)    Hgb urine dipstick SMALL (*)    Leukocytes, UA SMALL (*)    All other components within normal limits  URINE MICROSCOPIC-ADD ON - Abnormal; Notable for the following:    Squamous Epithelial / LPF 0-5 (*)    Bacteria, UA RARE (*)    All other components within normal limits  LIPASE, BLOOD    Imaging Review Ct Abdomen Pelvis W Contrast  02/18/2015  CLINICAL DATA:  Acute onset of right lower quadrant abdominal pain. Initial encounter. EXAM: CT ABDOMEN AND PELVIS WITH CONTRAST TECHNIQUE: Multidetector CT imaging of the abdomen and pelvis was performed using the standard protocol following bolus administration of intravenous contrast. CONTRAST:  161mL OMNIPAQUE IOHEXOL 300 MG/ML  SOLN  COMPARISON:  CT of the abdomen and pelvis from 06/04/2013 FINDINGS: The visualized lung bases are clear. The liver and spleen are unremarkable in appearance. The gallbladder is within normal limits. The pancreas and adrenal glands are unremarkable. A 1.6 cm cyst is noted at the medial aspect of the left kidney. The kidneys are otherwise unremarkable. There is no evidence of hydronephrosis. No renal or ureteral stones are seen. No perinephric stranding is appreciated. The small bowel is unremarkable in appearance. The stomach is within normal limits. No acute vascular abnormalities are seen. Minimal calcification is noted along the distal abdominal aorta and its branches. A large diverticulum is noted arising at the cecum, with wall thickening and surrounding soft tissue inflammation and fluid, concerning for acute diverticulitis. The appendix remains normal in caliber, without evidence of appendicitis. Scattered  diverticulosis is noted along the ascending, distal descending and proximal sigmoid colon. There is nonspecific prominence of mesenteric nodes, less prominent than in 2015 and likely reflecting the patient's baseline. The bladder is moderately distended and grossly unremarkable. The uterus is unremarkable in appearance. The ovaries are grossly symmetric. No suspicious adnexal masses are seen. No inguinal lymphadenopathy is seen. No acute osseous abnormalities are identified. IMPRESSION: 1. Large diverticulum at the cecum, with wall thickening and surrounding soft tissue inflammation and fluid, concerning for acute diverticulitis. 2. Scattered diverticulosis along the ascending, distal descending and proximal sigmoid colon. 3. Small left renal cyst noted. 4. Nonspecific prominence of mesenteric nodes is less prominent than in 2015 and likely reflects the patient's baseline. Electronically Signed   By: Garald Balding M.D.   On: 02/18/2015 23:39   I have personally reviewed and evaluated these images and lab  results as part of my medical decision-making.    MDM   Final diagnoses:  Diverticulitis of intestine without perforation or abscess without bleeding    Patient presents with worsening right lower quadrant abdominal pain and associated fever. Hx of CLL. Denies history of abdominal surgeries, denies nausea, vomiting, diarrhea. VSS. Exam revealed right lower quadrant tenderness, no peritoneal signs. Patient given IV fluids, Zofran and pain meds. WBC 62.2, unchanged from patient's baseline. Remaining labs and UA unremarkable. CT abdomen showed acute diverticulitis without evidence of abscess or perforation. Pt given dose of IV flagyl and cipro in the ED. Patient reports her pain and nausea have improved. Discussed results with patient, she is requesting to be discharged home after IV antibiotics. Due to pt's acute diverticulitis without associated complications I feel that pt is appropriate to be d/c home. Discussed strict return precautions, which pt agrees to, and advised pt to follow up with PCP in 2-3 days.   Evaluation does not show pathology requring ongoing emergent intervention or admission. Pt is hemodynamically stable and mentating appropriately. Discussed findings/results and plan with patient/guardian, who agrees with plan. All questions answered. Return precautions discussed and outpatient follow up given.      Chesley Noon [place], [place] 02/19/15 [id number]  Julianne Rice, MD 02/28/15 [phone]

## 2015-02-19 DIAGNOSIS — K5792 Diverticulitis of intestine, part unspecified, without perforation or abscess without bleeding: Secondary | ICD-10-CM | POA: Diagnosis not present

## 2015-02-19 MED ORDER — METRONIDAZOLE 500 MG PO TABS: 500.0000 mg | ORAL_TABLET | Freq: Three times a day (TID) | ORAL | Status: DC

## 2015-02-19 MED ORDER — CIPROFLOXACIN HCL 500 MG PO TABS: 500.0000 mg | ORAL_TABLET | Freq: Two times a day (BID) | ORAL | Status: DC

## 2015-02-19 MED ORDER — OXYCODONE-ACETAMINOPHEN 5-325 MG PO TABS: 2.0000 | ORAL_TABLET | ORAL | Status: DC | PRN

## 2015-02-19 MED ORDER — HEPARIN SOD (PORK) LOCK FLUSH 100 UNIT/ML IV SOLN
500.0000 [IU] | Freq: Once | INTRAVENOUS | Status: AC
Start: 2015-02-19 — End: 2015-02-19
  Administered 2015-02-19: 500 [IU]
  Filled 2015-02-19: qty 5

## 2015-02-19 MED ORDER — ONDANSETRON 4 MG PO TBDP: 4.0000 mg | ORAL_TABLET | Freq: Three times a day (TID) | ORAL | Status: DC | PRN

## 2015-02-19 NOTE — Discharge Instructions (Signed)
Take your medications as prescribed as needed for pain and nausea. I also recommend continuing to take ibuprofen as prescribed over-the-counter as needed for pain/fever. Follow-up with your primary care provider in the next 2-3 days. Return to the emergency department if symptoms worsen or new onset fever, vomiting, diarrhea, blood in vomit or stool, unable to keep fluids/food down. Diverticulitis Diverticulitis is inflammation or infection of small pouches in your colon that form when you have a condition called diverticulosis. The pouches in your colon are called diverticula. Your colon, or large intestine, is where water is absorbed and stool is formed. Complications of diverticulitis can include:  Bleeding.  Severe infection.  Severe pain.  Perforation of your colon.  Obstruction of your colon. CAUSES  Diverticulitis is caused by bacteria. Diverticulitis happens when stool becomes trapped in diverticula. This allows bacteria to grow in the diverticula, which can lead to inflammation and infection. RISK FACTORS People with diverticulosis are at risk for diverticulitis. Eating a diet that does not include enough fiber from fruits and vegetables may make diverticulitis more likely to develop. SYMPTOMS  Symptoms of diverticulitis may include:  Abdominal pain and tenderness. The pain is normally located on the left side of the abdomen, but may occur in other areas.  Fever and chills.  Bloating.  Cramping.  Nausea.  Vomiting.  Constipation.  Diarrhea.  Blood in your stool. DIAGNOSIS  Your health care provider will ask you about your medical history and do a physical exam. You may need to have tests done because many medical conditions can cause the same symptoms as diverticulitis. Tests may include:  Blood tests.  Urine tests.  Imaging tests of the abdomen, including X-rays and CT scans. When your condition is under control, your health care provider may recommend that  you have a colonoscopy. A colonoscopy can show how severe your diverticula are and whether something else is causing your symptoms. TREATMENT  Most cases of diverticulitis are mild and can be treated at home. Treatment may include:  Taking over-the-counter pain medicines.  Following a clear liquid diet.  Taking antibiotic medicines by mouth for 7-10 days. More severe cases may be treated at a hospital. Treatment may include:  Not eating or drinking.  Taking prescription pain medicine.  Receiving antibiotic medicines through an IV tube.  Receiving fluids and nutrition through an IV tube.  Surgery. HOME CARE INSTRUCTIONS   Follow your health care provider's instructions carefully.  Follow a full liquid diet or other diet as directed by your health care provider. After your symptoms improve, your health care provider may tell you to change your diet. He or she may recommend you eat a high-fiber diet. Fruits and vegetables are good sources of fiber. Fiber makes it easier to pass stool.  Take fiber supplements or probiotics as directed by your health care provider.  Only take medicines as directed by your health care provider.  Keep all your follow-up appointments. SEEK MEDICAL CARE IF:   Your pain does not improve.  You have a hard time eating food.  Your bowel movements do not return to normal. SEEK IMMEDIATE MEDICAL CARE IF:   Your pain becomes worse.  Your symptoms do not get better.  Your symptoms suddenly get worse.  You have a fever.  You have repeated vomiting.  You have bloody or black, tarry stools. MAKE SURE YOU:   Understand these instructions.  Will watch your condition.  Will get help right away if you are not doing well or  get worse.   This information is not intended to replace advice given to you by your health care provider. Make sure you discuss any questions you have with your health care provider.   Document Released: 11/20/2004 Document  Revised: 02/15/2013 Document Reviewed: 01/05/2013 Elsevier Interactive Patient Education [employer].

## 2015-03-01 ENCOUNTER — Ambulatory Visit (HOSPITAL_BASED_OUTPATIENT_CLINIC_OR_DEPARTMENT_OTHER): Payer: Medicare Other | Admitting: Oncology

## 2015-03-01 ENCOUNTER — Encounter: Payer: Self-pay | Admitting: *Deleted

## 2015-03-01 VITALS — BP 116/58 | HR 69 | Temp 97.6°F | Resp 18 | Ht <= 58 in | Wt 119.6 lb

## 2015-03-01 DIAGNOSIS — C9110 Chronic lymphocytic leukemia of B-cell type not having achieved remission: Secondary | ICD-10-CM

## 2015-03-01 MED ORDER — PROCHLORPERAZINE MALEATE 10 MG PO TABS: 10.0000 mg | ORAL_TABLET | Freq: Four times a day (QID) | ORAL | Status: DC | PRN

## 2015-03-01 MED ORDER — IBRUTINIB 140 MG PO CAPS: 140.0000 mg | ORAL_CAPSULE | Freq: Every day | ORAL | Status: DC

## 2015-03-01 NOTE — Progress Notes (Signed)
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 She has been treated with Rituxan, ofatumumab, cladribine, bendamustine and currently with ibrutinib, with details summarized below.  INTERVAL HISTORY: Rachel [name] returns today for follow up of her chronic lymphoid leukemia accompanied by her husband. They recently returned from a month-long trip to [place], mostly's pain, with the rest of the family. They greatly enjoyed that. However more recently she developed severe right lower quadrant pain which took her to the emergency room. They did some lab work which was essentially unremarkableaside from her high white cell count from [facility] (they did not do a differential, so I do not know if she had a high neutrophil count). They obtained a CT of the abdomen and pelvis which showed a large cecal diverticulum which appeared to be inflamed. She was started on Cipro and Flagyl and has been taking this a little bit suboptimally (she has missed a few doses) because the antibiotics are causing her an upset stomach. She has vomited a couple of times. However the pain in the right lower quadrant has largely resolved.  She took herself off the ibrutinib about a month ago.   REVIEW OF SYSTEMS: Aside from the GI problems she feels fine. She does have hot flashes and night sweats. She's cold at night early in the evening and then very hot in the morning. She bruises easily. She has occasional headaches. She feels forgetful and depressed at times. She is back in the gym however. She now has a Clinical research associate.Aside from these issues a detailed review of systems today was  noncontributory  PAST MEDICAL HISTORY: Past Medical History  Diagnosis Date  . Cancer ([place])   . Leukemia ([place])   . Lower back pain     PAST SURGICAL HISTORY: Past Surgical History  Procedure Laterality Date  . Tonsillectomy    . Other surgical history      removal of uterine lining  . Lining of uterus removed    . Bunionectomy      right foot  . Breast surgery  2015    left breast bx    FAMILY HISTORY Family History  Problem Relation Age of Onset  . Depression Son     GYNECOLOGIC HISTORY: Menarche age 40, first live birth age 38, she is [name]. She stopped having periods in 1999 after endometrial stripping. She continues on hormone replacement.  SOCIAL HISTORY: [name] is retired from [employer]. Her son [name] lives in [place] and has 3 sons; he works for a Pharmacist, community. A second son lives in [place] and has two children. Son [name] lives in [place] and has a daughter, as well as 2 sons by marriage.   ADVANCED DIRECTIVES: In place  HEALTH MAINTENANCE: Social History  Substance Use Topics  . Smoking status: Never Smoker   . Smokeless tobacco: Never Used  . Alcohol Use: Yes     Comment: socially     Colonoscopy: due  PAP: December 2012/ [name]  Bone density: Due  Lipid panel: per Dr Drema Dallas  Allergies  Allergen Reactions  . Penicillins Anaphylaxis    "stopped breathing" "drops my heartbeat" "I just pass out"    Current Outpatient  Prescriptions  Medication Sig Dispense Refill  . acetaminophen (TYLENOL) 500 MG tablet Take 500 mg by mouth every 6 (six) hours as needed for headache.    Marland Kitchen acyclovir (ZOVIRAX) 400 MG tablet Take 1 tablet (400 mg total) by mouth 2 (two) times daily. 180 tablet 4  . aspirin 81 MG tablet Take 81 mg by mouth daily.    . Calcium Citrate (CITRACAL PO) Take 4 tablets by mouth daily.     . cholecalciferol (VITAMIN D) 1000 UNITS tablet Take 4,000 Units by mouth daily.    . ciprofloxacin (CIPRO) 500 MG tablet Take 1  tablet (500 mg total) by mouth 2 (two) times daily. 20 tablet 0  . ELESTRIN 0.52 MG/0.87 GM (0.06%) GEL APPLY 2 PUMPS THINLY TO UPPER ARM DAILY AT BEDTIME 156 g 3  . ibrutinib (IMBRUVICA) 140 MG capsul Take 1 capsule (140 mg total) by mouth daily. 45 capsule 1  . lidocaine-prilocaine (EMLA) cream Apply a nickel size amount to skin on top of port- cover with saran wrap at least 1 hour before access 30 g 0  . metroNIDAZOLE (FLAGYL) 500 MG tablet Take 1 tablet (500 mg total) by mouth 3 (three) times daily. 30 tablet 0  . ondansetron (ZOFRAN ODT) 4 MG disintegrating tablet Take 1 tablet (4 mg total) by mouth every 8 (eight) hours as needed for nausea or vomiting. 10 tablet 0  . oxyCODONE-acetaminophen (PERCOCET/ROXICET) 5-325 MG tablet Take 2 tablets by mouth every 4 (four) hours as needed for severe pain. 10 tablet 0  . progesterone (PROMETRIUM) 200 MG capsule TAKE 1 CAPSULE ON DAYS 1 THROUGH 12 EVERY OTHER MONTH 24 capsule 2  . traZODone (DESYREL) 50 MG tablet Take 50 mg by mouth at bedtime.     No current facility-administered medications for this visit.    OBJECTIVE: Middle-aged white woman who appears well Filed Vitals:   03/01/15 1547  BP: 116/58  Pulse: 69  Temp: 97.6 F (36.4 C)  Resp: 18   Filed Weights   03/01/15 1547  Weight: 119 lb 9.6 oz (54.25 kg)   Body mass index is 25 kg/(m^2).    ECOG FS: 1  Sclerae unicteric, pupils round and equal Oropharynx clear and moist-- no thrush or other lesions No cervical or supraclavicular adenopathy, no axillary or inguinal adenopathy Lungs no rales or rhonchi Heart regular rate and rhythm Abd soft, nontender including palpation of the right lower quadrant, positive bowel sounds MSK no focal spinal tenderness, no upper extremity lymphedema Neuro: nonfocal, well oriented, appropriate affect Breasts: deferred  LAB RESULTS:  Lab Results  Component Value Date   WBC 62.2* 02/18/2015   NEUTROABS 3.1 02/08/2015   HGB 12.4 02/18/2015    HCT 38.6 02/18/2015   MCV 91.0 02/18/2015   PLT 151 02/18/2015      Chemistry      Component Value Date/Time   NA 135 02/18/2015 2200   NA 141 02/08/2015 0948   K 4.2 02/18/2015 2200   K 4.3 02/08/2015 0948   CL 101 02/18/2015 2200   CL 101 12/15/2011 1322   CO2 25 02/18/2015 2200   CO2 26 02/08/2015 0948   BUN 10 02/18/2015 2200   BUN 12.1 02/08/2015 0948   CREATININE 0.91 02/18/2015 2200   CREATININE 1.1 02/08/2015 0948      Component Value Date/Time   CALCIUM 9.0 02/18/2015 2200   CALCIUM 9.8 02/08/2015 0948   ALKPHOS 90 02/18/2015 2200   ALKPHOS 101 02/08/2015 0948   AST 36 02/18/2015  2200   AST 31 02/08/2015 0948   ALT 32 02/18/2015 2200   ALT 30 02/08/2015 0948   BILITOT 1.0 02/18/2015 2200   BILITOT 0.37 02/08/2015 0948     Results for MA, OURSLER (MRN [id number]) as of 03/01/2015 16:24  Ref. Range 11/07/2014 13:04 11/22/2014 13:12 01/08/2015 10:54 01/22/2015 09:40 02/08/2015 09:48  lymph# Latest Ref Range: 0.9-3.3 10e3/uL 18.5 (H) 18.2 (H) 61.5 (H) 21.0 (H) 46.0 (H)   STUDIES: Ct Abdomen Pelvis W Contrast  02/18/2015  CLINICAL DATA:  Acute onset of right lower quadrant abdominal pain. Initial encounter. EXAM: CT ABDOMEN AND PELVIS WITH CONTRAST TECHNIQUE: Multidetector CT imaging of the abdomen and pelvis was performed using the standard protocol following bolus administration of intravenous contrast. CONTRAST:  174mL OMNIPAQUE IOHEXOL 300 MG/ML  SOLN COMPARISON:  CT of the abdomen and pelvis from 06/04/2013 FINDINGS: The visualized lung bases are clear. The liver and spleen are unremarkable in appearance. The gallbladder is within normal limits. The pancreas and adrenal glands are unremarkable. A 1.6 cm cyst is noted at the medial aspect of the left kidney. The kidneys are otherwise unremarkable. There is no evidence of hydronephrosis. No renal or ureteral stones are seen. No perinephric stranding is appreciated. The small bowel is unremarkable in appearance. The  stomach is within normal limits. No acute vascular abnormalities are seen. Minimal calcification is noted along the distal abdominal aorta and its branches. A large diverticulum is noted arising at the cecum, with wall thickening and surrounding soft tissue inflammation and fluid, concerning for acute diverticulitis. The appendix remains normal in caliber, without evidence of appendicitis. Scattered diverticulosis is noted along the ascending, distal descending and proximal sigmoid colon. There is nonspecific prominence of mesenteric nodes, less prominent than in 2015 and likely reflecting the patient's baseline. The bladder is moderately distended and grossly unremarkable. The uterus is unremarkable in appearance. The ovaries are grossly symmetric. No suspicious adnexal masses are seen. No inguinal lymphadenopathy is seen. No acute osseous abnormalities are identified. IMPRESSION: 1. Large diverticulum at the cecum, with wall thickening and surrounding soft tissue inflammation and fluid, concerning for acute diverticulitis. 2. Scattered diverticulosis along the ascending, distal descending and proximal sigmoid colon. 3. Small left renal cyst noted. 4. Nonspecific prominence of mesenteric nodes is less prominent than in 2015 and likely reflects the patient's baseline. Electronically Signed   By: Garald Balding M.D.   On: 02/18/2015 23:39     ASSESSMENT: 65 y.o.  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 rigors 02/27/2014: received IV vancomycin and levaquin briefly, then levaquin po (pcn allergy)  (6) bendamustine/ rituximab for 6 cycles, completed 05/04/2014, with improvement but not normalization of the absolute lymphocyte count  (7) ibrutinib started 05/08/2014, taken with some irregularity, currently at 140 mg daily  PLAN: Rachel [name] is doing very well as far as her chronic lymphoid leukemia is concerned. She does not have any cytopenias. She has a good  functional status. Nevertheless the white cells are climbing fairly rapidly and I think if she does not take some suppressive therapy she is can I be back in the hole where she was a few months ago.  Accordingly I urged her to go back on her ibrutinib, just 1 tablet daily, but not to start until January 23 show she has a little time to get over the diverticular problem.  I think the symptoms she is having now are really do to the antibiotics. She only has 2 more days. I encouraged her to go ahead and take those. I gave her some Compazine for the nausea problem.  I also encouraged her to continue on her exercise program which is generally excellent.  I think it would be useful if she saw Dr. Collene Mares, who is her gastroenterologist, regarding advice on how to manage diverticular problems in the future.  Otherwise she will be back in [facility] for most of February. We will check her counts again early March and she will see me again in mid April. She knows to call for any problems that may develop before that visit.    Chauncey Cruel, MD 03/01/2015   Greatly still think you

## 2015-03-02 ENCOUNTER — Other Ambulatory Visit: Payer: Self-pay | Admitting: *Deleted

## 2015-03-02 ENCOUNTER — Telehealth: Payer: Self-pay | Admitting: Oncology

## 2015-03-02 MED ORDER — PROCHLORPERAZINE MALEATE 10 MG PO TABS: 10.0000 mg | ORAL_TABLET | Freq: Four times a day (QID) | ORAL | Status: DC | PRN

## 2015-03-02 MED ORDER — ALLOPURINOL 300 MG PO TABS: 300.0000 mg | ORAL_TABLET | Freq: Every day | ORAL | Status: DC

## 2015-03-02 NOTE — Telephone Encounter (Signed)
This RN spoke with pt regarding her call requesting for compazine to be called to local pharmacy.  Rachel [name] requested above to be called to [facility] at the corner of [address] and [address].  She also inquired about " when do I start the Allopurinol - is it the same day as when I start back on the Imbruvica ?"  Per review with MD this RN informed pt she can start a few days before but must start at latest day of restarting the Imbruvica.  Noted Allopurinol not on medication list - pt states she " still has some at home ".  New script for above sent to [facility] per discussion.  No other needs at this time.

## 2015-03-02 NOTE — Telephone Encounter (Signed)
Aware of follow up appointments,mailed to patient per her request and she will call dr Collene Mares as she is estab. wth her   Rachel [name]

## 2015-03-06 ENCOUNTER — Other Ambulatory Visit: Payer: Self-pay | Admitting: Obstetrics

## 2015-03-06 ENCOUNTER — Encounter: Payer: Self-pay | Admitting: Obstetrics

## 2015-03-06 ENCOUNTER — Ambulatory Visit (INDEPENDENT_AMBULATORY_CARE_PROVIDER_SITE_OTHER): Payer: Medicare Other | Admitting: Obstetrics

## 2015-03-06 VITALS — BP 95/63 | HR 80 | Temp 97.8°F | Ht 58.5 in | Wt 117.0 lb

## 2015-03-06 DIAGNOSIS — R87612 Low grade squamous intraepithelial lesion on cytologic smear of cervix (LGSIL): Secondary | ICD-10-CM

## 2015-03-06 NOTE — Patient Instructions (Signed)

## 2015-03-06 NOTE — Progress Notes (Signed)
Patient ID: Rachel [name], female   DOB: [birth year], 65 y.o.   MRN: [id number]  Chief Complaint  Patient presents with  . Follow-up    Repeat Pap    HPI Rachel [name] is a 65 y.o. female.  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  Presents for repeat pap.  HPI  Past Medical History  Diagnosis Date  . Cancer ([place])   . Leukemia ([place])   . Lower back pain   . Diverticulitis     Past Surgical History  Procedure Laterality Date  . Tonsillectomy    . Other surgical history      removal of uterine lining  . Lining of uterus removed    . Bunionectomy      right foot  . Breast surgery  2015    left breast bx    Family History  Problem Relation Age of Onset  . Depression Son     Social History Social History  Substance Use Topics  . Smoking status: Never Smoker   . Smokeless tobacco: Never Used  . Alcohol Use: 0.0 oz/week    0 Standard drinks or equivalent per week     Comment: socially    Allergies  Allergen Reactions  . Penicillins Anaphylaxis    "stopped breathing" "drops my heartbeat" "I just pass out"    Current Outpatient Prescriptions  Medication Sig Dispense Refill  . acetaminophen (TYLENOL) 500 MG tablet Take 500 mg by mouth every 6 (six) hours as needed for headache.    Marland Kitchen acyclovir (ZOVIRAX) 400 MG tablet Take 1 tablet (400 mg total) by mouth 2 (two) times daily. 180 tablet 4  . aspirin 81 MG tablet Take 81 mg by mouth daily.    . Calcium Citrate (CITRACAL PO) Take 4 tablets by mouth daily.     . cholecalciferol (VITAMIN D) 1000 UNITS tablet Take 4,000 Units by mouth daily.    Marland Kitchen ELESTRIN 0.52 MG/0.87 GM (0.06%) GEL APPLY 2 PUMPS THINLY TO UPPER ARM DAILY AT BEDTIME 156 g 3  . progesterone (PROMETRIUM) 200 MG capsule TAKE 1 CAPSULE ON DAYS 1 THROUGH 12 EVERY OTHER MONTH 24 capsule 2  . traZODone (DESYREL) 50 MG tablet Take 50 mg by mouth at bedtime.    Marland Kitchen allopurinol (ZYLOPRIM) 300 MG tablet Take 1 tablet (300 mg total) by mouth daily. (Patient  not taking: Reported on 03/06/2015) 90 tablet 3  . ibrutinib (IMBRUVICA) 140 MG capsul Take 1 capsule (140 mg total) by mouth daily. (Patient not taking: Reported on 03/06/2015) 45 capsule 1  . lidocaine-prilocaine (EMLA) cream Apply a nickel size amount to skin on top of port- cover with saran wrap at least 1 hour before access (Patient not taking: Reported on 03/06/2015) 30 g 0  . ondansetron (ZOFRAN ODT) 4 MG disintegrating tablet Take 1 tablet (4 mg total) by mouth every 8 (eight) hours as needed for nausea or vomiting. (Patient not taking: Reported on 03/06/2015) 10 tablet 0  . oxyCODONE-acetaminophen (PERCOCET/ROXICET) 5-325 MG tablet Take 2 tablets by mouth every 4 (four) hours as needed for severe pain. (Patient not taking: Reported on 03/06/2015) 10 tablet 0  . prochlorperazine (COMPAZINE) 10 MG tablet Take 1 tablet (10 mg total) by mouth every 6 (six) hours as needed for nausea or vomiting. (Patient not taking: Reported on 03/06/2015) 30 tablet 0   No current facility-administered medications for this visit.    Review of Systems Review of Systems Constitutional: negative for fatigue and weight loss Respiratory:  negative for cough and wheezing Cardiovascular: negative for chest pain, fatigue and palpitations Gastrointestinal: negative for abdominal pain and change in bowel habits Genitourinary:negative Integument/breast: negative for nipple discharge Musculoskeletal:negative for myalgias Neurological: negative for gait problems and tremors Behavioral/Psych: negative for abusive relationship, depression Endocrine: negative for temperature intolerance     Blood pressure 95/63, pulse 80, temperature 97.8 F (36.6 C), height 4' 10.5" (1.486 m), weight 117 lb (53.071 kg).  Physical Exam Physical Exam           General:  Alert and no distress Abdomen:  normal findings: no organomegaly, soft, non-tender and no hernia  Pelvis:  External genitalia: normal general appearance Urinary system:  urethral meatus normal and bladder without fullness, nontender Vaginal: normal without tenderness, induration or masses Cervix: normal appearance.  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 Repeat pap in 1 year if stable.  No orders of the defined types were placed in this encounter.   No orders of the defined types were placed in this encounter.

## 2015-03-08 DIAGNOSIS — K5730 Diverticulosis of large intestine without perforation or abscess without bleeding: Secondary | ICD-10-CM | POA: Diagnosis not present

## 2015-03-08 DIAGNOSIS — R933 Abnormal findings on diagnostic imaging of other parts of digestive tract: Secondary | ICD-10-CM | POA: Diagnosis not present

## 2015-03-08 LAB — PAP IG (IMAGE GUIDED)

## 2015-03-27 DIAGNOSIS — R109 Unspecified abdominal pain: Secondary | ICD-10-CM | POA: Diagnosis not present

## 2015-03-27 DIAGNOSIS — J0100 Acute maxillary sinusitis, unspecified: Secondary | ICD-10-CM | POA: Diagnosis not present

## 2015-03-27 DIAGNOSIS — R1084 Generalized abdominal pain: Secondary | ICD-10-CM | POA: Diagnosis not present

## 2015-03-28 ENCOUNTER — Telehealth: Payer: Self-pay

## 2015-03-28 NOTE — Telephone Encounter (Signed)
Patient called stating that she is in [place] and went to a clinic because she was having stomach issues.  Patient had a CT scan which indicated that she had an enlarged lymph node that she is concerned about.   Per Dr. Jana Hakim patient is not taking Imbruvica as directed, she is only taking one tablet and was directed to take three tablets daily.  MD not surprised with these new results and stated that patient should take her medication as directed.  Writer did give patient this message however patient states that she does not want to take more then one daily due to side effects.  Patient states that she is on an antibiotic because the physician in the clinic also mentioned that she has diverticulitis.  Patient states that she is "exhausted and does not trust herself to drive".  This RN encouraged her to call her oncologist in [place].  Patient agreed and will update Dr. Jana Hakim .

## 2015-03-29 NOTE — Telephone Encounter (Signed)
This RN attempted to call pt and obtained number identified VM. Message left informing pt this RN will contact MD in [place] for lab request.  This RN then called given number and was transferred to Dr Clare Charon assistant- and obtained a VM.  Message left stating above need as well as request for fax number to send orders.  This RN's name and direct number given for return call.

## 2015-03-29 NOTE — Telephone Encounter (Signed)
"  I am in [place].  I have stomach diverticulitis and a large lymph node.  Dr. Jana Hakim wants me to have blood test.  Dr. Rosine Abe [phone]) needs a referral.  I will start two Imbuvica today.  Three cause constant horrible headaches/migraines all day, all night with nothing to take the migraines away is why I was taking one daily.  Patient's return number [phone].

## 2015-04-03 DIAGNOSIS — Z79899 Other long term (current) drug therapy: Secondary | ICD-10-CM | POA: Diagnosis not present

## 2015-04-03 DIAGNOSIS — C9110 Chronic lymphocytic leukemia of B-cell type not having achieved remission: Secondary | ICD-10-CM | POA: Diagnosis not present

## 2015-04-03 DIAGNOSIS — D72829 Elevated white blood cell count, unspecified: Secondary | ICD-10-CM | POA: Diagnosis not present

## 2015-04-03 DIAGNOSIS — T7849XS Other allergy, sequela: Secondary | ICD-10-CM | POA: Diagnosis not present

## 2015-04-04 ENCOUNTER — Telehealth: Payer: Self-pay

## 2015-04-04 NOTE — Telephone Encounter (Signed)
Dr Cletus Gash in [facility] did a blood test. Part of the results stated something like "  p53 gene, 2 were normal and 3 were abnormal. Unfavorable for prognosis". The pt could not explain it better. The pt was wanting a better explanation. Dr Jana Hakim told pt to take 3 Imbruvica but pt was only taking 2. Dr Cletus Gash told her to take better care of herself so as of yesterday she is taking 3. I asked pt to have Dr Cletus Gash fax [place] the lab results so we could better explain what it meant. Dr Magrinat's fax # given to pt.

## 2015-04-05 ENCOUNTER — Other Ambulatory Visit: Payer: Self-pay | Admitting: *Deleted

## 2015-04-05 DIAGNOSIS — C9110 Chronic lymphocytic leukemia of B-cell type not having achieved remission: Secondary | ICD-10-CM

## 2015-04-05 MED ORDER — IBRUTINIB 140 MG PO CAPS: 420.0000 mg | ORAL_CAPSULE | Freq: Every day | ORAL | Status: DC

## 2015-04-05 NOTE — Telephone Encounter (Signed)
This RN spoke with pt per her call stating she was seen by MD in [place] while there for extended visit.  Mekhia states " He scared me and gave me the hail [facility] that if I wanted to live I needed to take my diagnosis seriously and take the imbruvica as intended at 3 tablets a day "  " he scared me enough that I am willing to try it and put up with some symptoms "  " this is now 2 doctors who feels that the 3 tablets would give me greater benefit so I need to listen "  Note she is referring to the MD in [place] and Dr Jana Hakim.  " I love Dr Jana Hakim because he knows me so well and I will try to take the 3 tablets a day as he originally wanted me too "  This RN validated pt's concern as well as reviewed upcoming appointments for lab and MD.  Lum Babe requested a refill on the Imbruvica to be sent to [facility] with increased dispense amount and above dosing.  Above reviewed with Dr Jana Hakim- prescription obtained and escribed to [facility].

## 2015-04-08 DIAGNOSIS — K5730 Diverticulosis of large intestine without perforation or abscess without bleeding: Secondary | ICD-10-CM | POA: Diagnosis not present

## 2015-04-08 DIAGNOSIS — C9110 Chronic lymphocytic leukemia of B-cell type not having achieved remission: Secondary | ICD-10-CM | POA: Diagnosis not present

## 2015-04-08 DIAGNOSIS — K5790 Diverticulosis of intestine, part unspecified, without perforation or abscess without bleeding: Secondary | ICD-10-CM | POA: Diagnosis not present

## 2015-04-08 DIAGNOSIS — I951 Orthostatic hypotension: Secondary | ICD-10-CM | POA: Diagnosis not present

## 2015-04-09 ENCOUNTER — Telehealth: Payer: Self-pay

## 2015-04-09 NOTE — Telephone Encounter (Signed)
Patient is calling from [place] stating that she started taking Imbruvica last Tuesday.  Yesterday she woke up dizzy, had chills, was nauseated, stomach pain and had 9 bowel movements.  She also stated that her WBC's dropped for 3.0 to 2.8  Patient feels that all these symptoms are due to stress and was hoping to see a therapist.  Writer encouraged patient to call her oncologist in [place]. Writer will share information with Dr. Jana Hakim.

## 2015-04-25 DIAGNOSIS — Z1231 Encounter for screening mammogram for malignant neoplasm of breast: Secondary | ICD-10-CM | POA: Diagnosis not present

## 2015-04-30 ENCOUNTER — Other Ambulatory Visit (HOSPITAL_BASED_OUTPATIENT_CLINIC_OR_DEPARTMENT_OTHER): Payer: Medicare Other

## 2015-04-30 DIAGNOSIS — C9110 Chronic lymphocytic leukemia of B-cell type not having achieved remission: Secondary | ICD-10-CM | POA: Diagnosis present

## 2015-04-30 LAB — CBC WITH DIFFERENTIAL/PLATELET
BASO%: 0.2 % (ref 0.0–2.0)
BASOS ABS: 0 10*3/uL (ref 0.0–0.1)
EOS%: 0.5 % (ref 0.0–7.0)
Eosinophils Absolute: 0.1 10*3/uL (ref 0.0–0.5)
HEMATOCRIT: 38.4 % (ref 34.8–46.6)
HGB: 12.6 g/dL (ref 11.6–15.9)
LYMPH#: 12.7 10*3/uL — AB (ref 0.9–3.3)
LYMPH%: 75.3 % — ABNORMAL HIGH (ref 14.0–49.7)
MCH: 30.7 pg (ref 25.1–34.0)
MCHC: 32.8 g/dL (ref 31.5–36.0)
MCV: 93.4 fL (ref 79.5–101.0)
MONO#: 0.6 10*3/uL (ref 0.1–0.9)
MONO%: 3.6 % (ref 0.0–14.0)
NEUT#: 3.4 10*3/uL (ref 1.5–6.5)
NEUT%: 20.4 % — AB (ref 38.4–76.8)
PLATELETS: 112 10*3/uL — AB (ref 145–400)
RBC: 4.11 10*6/uL (ref 3.70–5.45)
RDW: 13.2 % (ref 11.2–14.5)
WBC: 16.8 10*3/uL — ABNORMAL HIGH (ref 3.9–10.3)

## 2015-04-30 LAB — COMPREHENSIVE METABOLIC PANEL
ALBUMIN: 3.7 g/dL (ref 3.5–5.0)
ALK PHOS: 56 U/L (ref 40–150)
ALT: 16 U/L (ref 0–55)
ANION GAP: 8 meq/L (ref 3–11)
AST: 19 U/L (ref 5–34)
BUN: 12.6 mg/dL (ref 7.0–26.0)
CALCIUM: 9 mg/dL (ref 8.4–10.4)
CO2: 28 mEq/L (ref 22–29)
Chloride: 104 mEq/L (ref 98–109)
Creatinine: 0.8 mg/dL (ref 0.6–1.1)
EGFR: 77 mL/min/{1.73_m2} — AB (ref >90)
Glucose: 80 mg/dl (ref 70–140)
POTASSIUM: 4.1 meq/L (ref 3.5–5.1)
Sodium: 139 mEq/L (ref 136–145)
Total Bilirubin: 0.6 mg/dL (ref 0.20–1.20)
Total Protein: 6.4 g/dL (ref 6.4–8.3)

## 2015-05-03 ENCOUNTER — Other Ambulatory Visit: Payer: Medicare Other

## 2015-05-03 ENCOUNTER — Telehealth: Payer: Self-pay | Admitting: *Deleted

## 2015-05-03 NOTE — Telephone Encounter (Signed)
"  I take Ibrance and have headaches.  I usually try to ignore them but I hurt so bad I cannot flex or turn my neck.  I read my bible every morning and having trouble reading and use a magnifying glass.  I do not like to take a lot of tylenol or aleve because of the side effects on the body.   Something was prescribed previously but I do not know what it was.  I am in [place].  Return number [phone]."   Advised she try warm compresses to neck.

## 2015-05-03 NOTE — Telephone Encounter (Signed)
Returned call to patient.  Patient reports she is gettting horrible headaches whenever she takes the full dose of ibruvica.  Pt reports pain is constant 10/10.  Pt reports stiffness in her neck.  She is concerned it is getting "like it was before".  Pt reports she is having difficulty focusing her eyes.  I advised pt I would speak with Dr. Jana Hakim and call her back.  Pt voiced understanding.

## 2015-05-03 NOTE — Telephone Encounter (Signed)
Per Dr. Lindi Adie, he can see patient at 11 tomorrow.  Pt voiced understanding.  Appt made.

## 2015-05-04 ENCOUNTER — Ambulatory Visit (HOSPITAL_COMMUNITY)
Admission: RE | Admit: 2015-05-04 | Discharge: 2015-05-04 | Disposition: A | Discharge status: Home / Self Care | Payer: Medicare Other | Source: Ambulatory Visit | Attending: Oncology | Admitting: Oncology

## 2015-05-04 ENCOUNTER — Ambulatory Visit (HOSPITAL_BASED_OUTPATIENT_CLINIC_OR_DEPARTMENT_OTHER): Payer: Medicare Other | Admitting: Oncology

## 2015-05-04 VITALS — BP 116/60 | HR 67 | Temp 97.6°F | Resp 18 | Ht 58.5 in | Wt 117.7 lb

## 2015-05-04 DIAGNOSIS — M50321 Other cervical disc degeneration at C4-C5 level: Secondary | ICD-10-CM | POA: Diagnosis not present

## 2015-05-04 DIAGNOSIS — M5030 Other cervical disc degeneration, unspecified cervical region: Secondary | ICD-10-CM | POA: Diagnosis not present

## 2015-05-04 DIAGNOSIS — C9111 Chronic lymphocytic leukemia of B-cell type in remission: Secondary | ICD-10-CM | POA: Diagnosis not present

## 2015-05-04 DIAGNOSIS — C9110 Chronic lymphocytic leukemia of B-cell type not having achieved remission: Secondary | ICD-10-CM | POA: Insufficient documentation

## 2015-05-04 DIAGNOSIS — M50322 Other cervical disc degeneration at C5-C6 level: Secondary | ICD-10-CM | POA: Insufficient documentation

## 2015-05-04 DIAGNOSIS — J029 Acute pharyngitis, unspecified: Secondary | ICD-10-CM | POA: Diagnosis not present

## 2015-05-04 NOTE — Progress Notes (Signed)
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 She has been treated with Rituxan, ofatumumab, cladribine, bendamustine and currently with ibrutinib, with details summarized below.  INTERVAL HISTORY: Rachel [name] returns today for follow up of her chronic lymphoid leukemia. She continues to struggle with ibrutinib but her physician in [facility] but this year got in her and told her all the other options were much worse and she needed to take something or she would be dead within 2 years. She went back on ibrutinib full dose, 3 tablets daily, about 3 weeks ago. She is tolerating it well.  The symptoms she associates with ibrutinib is an unusual type of headache that begins in the back of her head and shoots up the scalp. It doesn't seem to be related to position. It occurs even when she is not taking ibrutinib.  Aside from that she just took care of ascitic granddaughter and now has significant watery eyes, sinus headaches, and sore throat. She has no fever. She is taking Allegra and Mucinex and is planning to see her primary care physician regarding that.  REVIEW OF SYSTEMS: Rachel [name] is a bit down today because a grandson just had ear surgery. He is doing well however. She just worries about them. She also worries about her 72 year old grandmother who is the reason she goes to [facility] so frequently. She does have 4 siblings there would take care of her mom most of the time. Aside from the sinus issues, she has some stress urinary incontinence and feels a little forgetful. She has moderate hot flashes. A detailed review of systems today was otherwise  noncontributory   PAST MEDICAL HISTORY: Past Medical History  Diagnosis Date  . Cancer ([place])   . Leukemia ([place])   . Lower back pain   . Diverticulitis     PAST SURGICAL HISTORY: Past Surgical History  Procedure Laterality Date  . Tonsillectomy    . Other surgical history      removal of uterine lining  . Lining of uterus removed    . Bunionectomy      right foot  . Breast surgery  2015    left breast bx    FAMILY HISTORY Family History  Problem Relation Age of Onset  . Depression Son     GYNECOLOGIC HISTORY: Menarche age 54, first live birth age 79, she is [place] P3. She stopped having periods in 1999 after endometrial stripping. She continues on hormone replacement.  SOCIAL HISTORY: [name] is retired from [employer]. Her son [name] lives in [place] and has 3 sons; he works for a Pharmacist, community. A second son lives in [place] and has two children. Son [name] lives in [place] and has a daughter, as well as 2 sons by marriage.   ADVANCED DIRECTIVES: In place  HEALTH MAINTENANCE: Social History  Substance Use Topics  . Smoking status: Never Smoker   . Smokeless tobacco: Never Used  . Alcohol Use: 0.0 oz/week    0 Standard drinks or equivalent per week     Comment: socially     Colonoscopy: due  PAP: December 2012/ [name]  Bone density: Due  Lipid panel: per Dr Drema Dallas  Allergies  Allergen Reactions  . Penicillins Anaphylaxis    "stopped breathing" "drops my heartbeat" "I just pass out"    Current Outpatient Prescriptions  Medication Sig Dispense Refill  . acetaminophen (TYLENOL) 500 MG tablet Take 500 mg by mouth every 6 (six) hours as needed for headache.    Marland Kitchen acyclovir (ZOVIRAX) 400 MG tablet Take 1 tablet (400 mg total) by mouth 2 (two) times daily. 180 tablet 4  . allopurinol (ZYLOPRIM) 300 MG tablet Take 1 tablet (300 mg total) by mouth daily. (Patient not taking: Reported on 03/06/2015) 90 tablet 3  . aspirin 81 MG tablet Take 81 mg by  mouth daily.    . Calcium Citrate (CITRACAL PO) Take 4 tablets by mouth daily.     . cholecalciferol (VITAMIN D) 1000 UNITS tablet Take 4,000 Units by mouth daily.    Marland Kitchen ELESTRIN 0.52 MG/0.87 GM (0.06%) GEL APPLY 2 PUMPS THINLY TO UPPER ARM DAILY AT BEDTIME 156 g 3  . ibrutinib (IMBRUVICA) 140 MG capsul Take 3 capsules (420 mg total) by mouth daily. 270 capsule 1  . lidocaine-prilocaine (EMLA) cream Apply a nickel size amount to skin on top of port- cover with saran wrap at least 1 hour before access (Patient not taking: Reported on 03/06/2015) 30 g 0  . ondansetron (ZOFRAN ODT) 4 MG disintegrating tablet Take 1 tablet (4 mg total) by mouth every 8 (eight) hours as needed for nausea or vomiting. (Patient not taking: Reported on 03/06/2015) 10 tablet 0  . oxyCODONE-acetaminophen (PERCOCET/ROXICET) 5-325 MG tablet Take 2 tablets by mouth every 4 (four) hours as needed for severe pain. (Patient not taking: Reported on 03/06/2015) 10 tablet 0  . prochlorperazine (COMPAZINE) 10 MG tablet Take 1 tablet (10 mg total) by mouth every 6 (six) hours as needed for nausea or vomiting. (Patient not taking: Reported on 03/06/2015) 30 tablet 0  . progesterone (PROMETRIUM) 200 MG capsule TAKE 1 CAPSULE ON DAYS 1 THROUGH 12 EVERY OTHER MONTH 24 capsule 1  . traZODone (DESYREL) 50 MG tablet Take 50 mg by mouth at bedtime.     No current facility-administered medications for this visit.    OBJECTIVE: Middle-aged white woman  Filed Vitals:   05/04/15 1217  BP: 116/60  Pulse: 67  Temp: 97.6 F (36.4 C)  Resp: 18   Filed Weights   05/04/15 1217  Weight: 117 lb 11.2 oz (53.388 kg)   Body mass index is 24.18 kg/(m^2).    ECOG FS: 1  Sclerae unicteric, EOMs intact Oropharynx clear, dentition in good repair No cervical or supraclavicular adenopathy, no axillary or inguinal adenopathy Lungs no rales or rhonchi Heart regular rate and rhythm Abd soft, nontender, positive bowel sounds MSK no focal spinal tenderness,  no upper extremity lymphedema, fair range of motion at the neck Neuro: nonfocal, well oriented, appropriate affect   LAB RESULTS:  Lab Results  Component Value Date   WBC 16.8* 04/30/2015   NEUTROABS 3.4 04/30/2015   HGB 12.6 04/30/2015   HCT 38.4 04/30/2015   MCV 93.4 04/30/2015   PLT 112* 04/30/2015      Chemistry      Component Value Date/Time   NA 139 04/30/2015 1026   NA 135 02/18/2015 2200   K 4.1 04/30/2015 1026   K 4.2 02/18/2015 2200   CL 101 02/18/2015 2200   CL 101 12/15/2011 1322   CO2 28 04/30/2015 1026   CO2 25 02/18/2015 2200   BUN 12.6 04/30/2015 1026   BUN 10 02/18/2015 2200  CREATININE 0.8 04/30/2015 1026   CREATININE 0.91 02/18/2015 2200      Component Value Date/Time   CALCIUM 9.0 04/30/2015 1026   CALCIUM 9.0 02/18/2015 2200   ALKPHOS 56 04/30/2015 1026   ALKPHOS 90 02/18/2015 2200   AST 19 04/30/2015 1026   AST 36 02/18/2015 2200   ALT 16 04/30/2015 1026   ALT 32 02/18/2015 2200   BILITOT 0.60 04/30/2015 1026   BILITOT 1.0 02/18/2015 2200     STUDIES: No results found.   ASSESSMENT: 65 y.o.  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 rigors 02/27/2014: received IV vancomycin and levaquin briefly, then levaquin po (pcn allergy)  (6) bendamustine/ rituximab for 6 cycles, completed 05/04/2014, with  improvement but not normalization of the absolute lymphocyte count  (7) ibrutinib started 05/08/2014, taken with some irregularity, currently at 420 mg daily  PLAN: Rachel [name] tells me she is willing to continue on full dose ibrutinib. I really think the headaches she is having have nothing to do with this medication. 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  Currently she has no peripheral adenopathy, her lymphocyte count continues to go down, and she has no anemia. Her platelet count was 112,000. This requires only follow-up.  We discussed chlorambucil and obinutuzumab which would be an alternative for her, if she decided against continuing the ibrutinib. We could also try idelilasib usually given with right toxin. All of these options have considerably more frequent and more dangerous side effects then the ibrutinib she is currently on.  After much discussion we decided she will stay on the current drug, we will do lab work at the end of every month, and she will see me again in 6 months from now.  She knows to call for any problems that may develop before her next visit here.   Chauncey Cruel, MD 05/04/2015

## 2015-05-08 ENCOUNTER — Telehealth: Payer: Self-pay | Admitting: Oncology

## 2015-05-08 NOTE — Telephone Encounter (Signed)
Left message for patient to inform her of next lab appt 3/38 and at that time she can get a printed schedule for appt date/time. Awaing Dr Collene Mares office to return call to sch ov consult

## 2015-05-17 ENCOUNTER — Other Ambulatory Visit: Payer: Self-pay

## 2015-05-22 ENCOUNTER — Ambulatory Visit (HOSPITAL_BASED_OUTPATIENT_CLINIC_OR_DEPARTMENT_OTHER): Payer: Medicare Other

## 2015-05-22 ENCOUNTER — Other Ambulatory Visit (HOSPITAL_BASED_OUTPATIENT_CLINIC_OR_DEPARTMENT_OTHER): Payer: Medicare Other

## 2015-05-22 VITALS — BP 124/53 | HR 67 | Temp 98.6°F

## 2015-05-22 DIAGNOSIS — C9111 Chronic lymphocytic leukemia of B-cell type in remission: Secondary | ICD-10-CM | POA: Diagnosis not present

## 2015-05-22 DIAGNOSIS — C9110 Chronic lymphocytic leukemia of B-cell type not having achieved remission: Secondary | ICD-10-CM

## 2015-05-22 DIAGNOSIS — Z95828 Presence of other vascular implants and grafts: Secondary | ICD-10-CM

## 2015-05-22 LAB — CBC WITH DIFFERENTIAL/PLATELET
BASO%: 0.2 % (ref 0.0–2.0)
BASOS ABS: 0 10*3/uL (ref 0.0–0.1)
EOS ABS: 0.1 10*3/uL (ref 0.0–0.5)
EOS%: 0.5 % (ref 0.0–7.0)
HEMATOCRIT: 39.8 % (ref 34.8–46.6)
HEMOGLOBIN: 12.9 g/dL (ref 11.6–15.9)
LYMPH%: 68.4 % — ABNORMAL HIGH (ref 14.0–49.7)
MCH: 30 pg (ref 25.1–34.0)
MCHC: 32.4 g/dL (ref 31.5–36.0)
MCV: 92.6 fL (ref 79.5–101.0)
MONO#: 0.8 10*3/uL (ref 0.1–0.9)
MONO%: 4.9 % (ref 0.0–14.0)
NEUT%: 26 % — AB (ref 38.4–76.8)
NEUTROS ABS: 4.5 10*3/uL (ref 1.5–6.5)
Platelets: 140 10*3/uL — ABNORMAL LOW (ref 145–400)
RBC: 4.3 10*6/uL (ref 3.70–5.45)
RDW: 12.6 % (ref 11.2–14.5)
WBC: 17.2 10*3/uL — ABNORMAL HIGH (ref 3.9–10.3)
lymph#: 11.8 10*3/uL — ABNORMAL HIGH (ref 0.9–3.3)

## 2015-05-22 LAB — COMPREHENSIVE METABOLIC PANEL
ALBUMIN: 3.6 g/dL (ref 3.5–5.0)
ALK PHOS: 75 U/L (ref 40–150)
ALT: 31 U/L (ref 0–55)
AST: 27 U/L (ref 5–34)
Anion Gap: 6 mEq/L (ref 3–11)
BILIRUBIN TOTAL: 0.5 mg/dL (ref 0.20–1.20)
BUN: 12.1 mg/dL (ref 7.0–26.0)
CALCIUM: 9 mg/dL (ref 8.4–10.4)
CO2: 28 mEq/L (ref 22–29)
Chloride: 107 mEq/L (ref 98–109)
Creatinine: 0.8 mg/dL (ref 0.6–1.1)
EGFR: 73 mL/min/{1.73_m2} — ABNORMAL LOW (ref >90)
Glucose: 83 mg/dl (ref 70–140)
POTASSIUM: 4.2 meq/L (ref 3.5–5.1)
Sodium: 140 mEq/L (ref 136–145)
TOTAL PROTEIN: 6.7 g/dL (ref 6.4–8.3)

## 2015-05-22 LAB — TECHNOLOGIST REVIEW

## 2015-05-22 MED ORDER — HEPARIN SOD (PORK) LOCK FLUSH 100 UNIT/ML IV SOLN
500.0000 [IU] | Freq: Once | INTRAVENOUS | Status: AC
  Administered 2015-05-22: 500 [IU] via INTRAVENOUS
  Filled 2015-05-22: qty 5

## 2015-05-22 MED ORDER — SODIUM CHLORIDE 0.9% FLUSH
10.0000 mL | INTRAVENOUS | Status: DC | PRN
  Administered 2015-05-22: 10 mL via INTRAVENOUS
  Filled 2015-05-22: qty 10

## 2015-05-22 NOTE — Patient Instructions (Signed)

## 2015-06-07 ENCOUNTER — Other Ambulatory Visit: Payer: Medicare Other

## 2015-06-07 ENCOUNTER — Ambulatory Visit: Payer: Medicare Other | Admitting: Oncology

## 2015-06-19 ENCOUNTER — Telehealth: Payer: Self-pay | Admitting: *Deleted

## 2015-06-19 ENCOUNTER — Other Ambulatory Visit: Payer: Self-pay | Admitting: Oncology

## 2015-06-19 ENCOUNTER — Other Ambulatory Visit (HOSPITAL_BASED_OUTPATIENT_CLINIC_OR_DEPARTMENT_OTHER): Payer: Medicare Other

## 2015-06-19 DIAGNOSIS — C9110 Chronic lymphocytic leukemia of B-cell type not having achieved remission: Secondary | ICD-10-CM

## 2015-06-19 LAB — CBC WITH DIFFERENTIAL/PLATELET
BASO%: 0.1 % (ref 0.0–2.0)
BASOS ABS: 0 10*3/uL (ref 0.0–0.1)
EOS ABS: 0.1 10*3/uL (ref 0.0–0.5)
EOS%: 0.8 % (ref 0.0–7.0)
HEMATOCRIT: 40.3 % (ref 34.8–46.6)
HEMOGLOBIN: 12.9 g/dL (ref 11.6–15.9)
LYMPH#: 7 10*3/uL — AB (ref 0.9–3.3)
LYMPH%: 81.5 % — ABNORMAL HIGH (ref 14.0–49.7)
MCH: 29 pg (ref 25.1–34.0)
MCHC: 32 g/dL (ref 31.5–36.0)
MCV: 90.5 fL (ref 79.5–101.0)
MONO#: 1 10*3/uL — ABNORMAL HIGH (ref 0.1–0.9)
MONO%: 11.1 % (ref 0.0–14.0)
NEUT%: 6.5 % — ABNORMAL LOW (ref 38.4–76.8)
NEUTROS ABS: 0.6 10*3/uL — AB (ref 1.5–6.5)
Platelets: 134 10*3/uL — ABNORMAL LOW (ref 145–400)
RBC: 4.45 10*6/uL (ref 3.70–5.45)
RDW: 12 % (ref 11.2–14.5)
WBC: 8.6 10*3/uL (ref 3.9–10.3)

## 2015-06-19 LAB — COMPREHENSIVE METABOLIC PANEL
ALBUMIN: 3.6 g/dL (ref 3.5–5.0)
ALK PHOS: 72 U/L (ref 40–150)
ALT: 19 U/L (ref 0–55)
AST: 20 U/L (ref 5–34)
Anion Gap: 6 mEq/L (ref 3–11)
BILIRUBIN TOTAL: 0.44 mg/dL (ref 0.20–1.20)
BUN: 12.4 mg/dL (ref 7.0–26.0)
CALCIUM: 9.7 mg/dL (ref 8.4–10.4)
CO2: 30 mEq/L — ABNORMAL HIGH (ref 22–29)
Chloride: 105 mEq/L (ref 98–109)
Creatinine: 0.9 mg/dL (ref 0.6–1.1)
EGFR: 69 mL/min/{1.73_m2} — ABNORMAL LOW (ref >90)
GLUCOSE: 77 mg/dL (ref 70–140)
POTASSIUM: 4.4 meq/L (ref 3.5–5.1)
Sodium: 141 mEq/L (ref 136–145)
TOTAL PROTEIN: 6.5 g/dL (ref 6.4–8.3)

## 2015-06-19 LAB — TECHNOLOGIST REVIEW

## 2015-06-19 NOTE — Telephone Encounter (Signed)
This RN spoke with pt post obtaining communication that she contacted the after hours nurse regarding headache and " swollen port "-  Rachel [name] stated " this morning my port is fine and my headaches are at the base of my neck but like a 1 in pain compared to last August "  Plan per call is pt will wait for RN to review labs and port site with her when she comes in this am for routine labs.

## 2015-06-19 NOTE — Telephone Encounter (Signed)
Per MD review of lab this RN went to the waiting room to discuss recommendations and to assess her port site. This RN could not locate pt- called her cell number and pt has left the office.  This RN informed pt of noted low ANC with MD recommending pt to hold her Imbruvica for 1 week.  This RN voiced concern for port site not assessed by this RN- Rachel [name] states port is not sore, swollen or red today- she denies having any fevers.  This RN discussed with pt neutropenic cautions including to call this office if she develops a fever, chills or cough and if port site

## 2015-06-20 ENCOUNTER — Telehealth: Payer: Self-pay

## 2015-06-20 NOTE — Telephone Encounter (Signed)
Patient states that she continues to have problems with her port- it is tender and swollen.  She is coming over to see Val, Therapist, sports per yesterdays conversation.

## 2015-06-21 ENCOUNTER — Ambulatory Visit (HOSPITAL_BASED_OUTPATIENT_CLINIC_OR_DEPARTMENT_OTHER): Payer: Medicare Other

## 2015-06-21 ENCOUNTER — Other Ambulatory Visit (HOSPITAL_BASED_OUTPATIENT_CLINIC_OR_DEPARTMENT_OTHER): Payer: Medicare Other

## 2015-06-21 DIAGNOSIS — C9110 Chronic lymphocytic leukemia of B-cell type not having achieved remission: Secondary | ICD-10-CM

## 2015-06-21 DIAGNOSIS — Z95828 Presence of other vascular implants and grafts: Secondary | ICD-10-CM

## 2015-06-21 LAB — COMPREHENSIVE METABOLIC PANEL
ALT: 19 U/L (ref 0–55)
ANION GAP: 7 meq/L (ref 3–11)
AST: 17 U/L (ref 5–34)
Albumin: 3.5 g/dL (ref 3.5–5.0)
Alkaline Phosphatase: 71 U/L (ref 40–150)
BILIRUBIN TOTAL: 0.53 mg/dL (ref 0.20–1.20)
BUN: 15.5 mg/dL (ref 7.0–26.0)
CO2: 29 meq/L (ref 22–29)
CREATININE: 0.9 mg/dL (ref 0.6–1.1)
Calcium: 9.3 mg/dL (ref 8.4–10.4)
Chloride: 101 mEq/L (ref 98–109)
EGFR: 66 mL/min/{1.73_m2} — ABNORMAL LOW (ref >90)
GLUCOSE: 101 mg/dL (ref 70–140)
Potassium: 4 mEq/L (ref 3.5–5.1)
SODIUM: 137 meq/L (ref 136–145)
TOTAL PROTEIN: 6.3 g/dL — AB (ref 6.4–8.3)

## 2015-06-21 LAB — CBC WITH DIFFERENTIAL/PLATELET
BASO%: 0.5 % (ref 0.0–2.0)
Basophils Absolute: 0 10*3/uL (ref 0.0–0.1)
EOS ABS: 0 10*3/uL (ref 0.0–0.5)
EOS%: 0.4 % (ref 0.0–7.0)
HCT: 36.5 % (ref 34.8–46.6)
HGB: 12.1 g/dL (ref 11.6–15.9)
LYMPH%: 54.7 % — ABNORMAL HIGH (ref 14.0–49.7)
MCH: 29.4 pg (ref 25.1–34.0)
MCHC: 33.2 g/dL (ref 31.5–36.0)
MCV: 88.7 fL (ref 79.5–101.0)
MONO#: 0.8 10*3/uL (ref 0.1–0.9)
MONO%: 18.5 % — AB (ref 0.0–14.0)
NEUT%: 25.9 % — ABNORMAL LOW (ref 38.4–76.8)
NEUTROS ABS: 1.2 10*3/uL — AB (ref 1.5–6.5)
NRBC: 0 % (ref 0–0)
PLATELETS: 117 10*3/uL — AB (ref 145–400)
RBC: 4.12 10*6/uL (ref 3.70–5.45)
RDW: 12 % (ref 11.2–14.5)
WBC: 4.5 10*3/uL (ref 3.9–10.3)
lymph#: 2.4 10*3/uL (ref 0.9–3.3)

## 2015-06-21 LAB — TECHNOLOGIST REVIEW

## 2015-06-21 MED ORDER — HEPARIN SOD (PORK) LOCK FLUSH 100 UNIT/ML IV SOLN
500.0000 [IU] | Freq: Once | INTRAVENOUS | Status: AC
  Administered 2015-06-21: 500 [IU] via INTRAVENOUS
  Filled 2015-06-21: qty 5

## 2015-06-21 MED ORDER — SODIUM CHLORIDE 0.9% FLUSH
10.0000 mL | INTRAVENOUS | Status: DC | PRN
  Administered 2015-06-21: 10 mL via INTRAVENOUS
  Filled 2015-06-21: qty 10

## 2015-06-21 NOTE — Patient Instructions (Signed)

## 2015-06-22 ENCOUNTER — Other Ambulatory Visit: Payer: Self-pay | Admitting: *Deleted

## 2015-06-25 ENCOUNTER — Ambulatory Visit (HOSPITAL_BASED_OUTPATIENT_CLINIC_OR_DEPARTMENT_OTHER): Payer: Medicare Other

## 2015-06-25 ENCOUNTER — Other Ambulatory Visit (HOSPITAL_BASED_OUTPATIENT_CLINIC_OR_DEPARTMENT_OTHER): Payer: Medicare Other

## 2015-06-25 ENCOUNTER — Telehealth: Payer: Self-pay | Admitting: *Deleted

## 2015-06-25 ENCOUNTER — Other Ambulatory Visit: Payer: Self-pay | Admitting: *Deleted

## 2015-06-25 ENCOUNTER — Ambulatory Visit (HOSPITAL_COMMUNITY)
Admission: RE | Admit: 2015-06-25 | Discharge: 2015-06-25 | Disposition: A | Discharge status: Home / Self Care | Payer: Medicare Other | Source: Ambulatory Visit | Attending: Nurse Practitioner | Admitting: Nurse Practitioner

## 2015-06-25 ENCOUNTER — Ambulatory Visit (HOSPITAL_BASED_OUTPATIENT_CLINIC_OR_DEPARTMENT_OTHER): Payer: Medicare Other | Admitting: Nurse Practitioner

## 2015-06-25 VITALS — BP 103/44 | HR 62 | Temp 98.6°F | Resp 18 | Ht 58.5 in | Wt 124.1 lb

## 2015-06-25 DIAGNOSIS — R935 Abnormal findings on diagnostic imaging of other abdominal regions, including retroperitoneum: Secondary | ICD-10-CM | POA: Diagnosis not present

## 2015-06-25 DIAGNOSIS — D702 Other drug-induced agranulocytosis: Secondary | ICD-10-CM

## 2015-06-25 DIAGNOSIS — R161 Splenomegaly, not elsewhere classified: Secondary | ICD-10-CM | POA: Insufficient documentation

## 2015-06-25 DIAGNOSIS — R590 Localized enlarged lymph nodes: Secondary | ICD-10-CM | POA: Diagnosis not present

## 2015-06-25 DIAGNOSIS — N200 Calculus of kidney: Secondary | ICD-10-CM

## 2015-06-25 DIAGNOSIS — E860 Dehydration: Secondary | ICD-10-CM

## 2015-06-25 DIAGNOSIS — Z79899 Other long term (current) drug therapy: Secondary | ICD-10-CM

## 2015-06-25 DIAGNOSIS — M545 Low back pain: Secondary | ICD-10-CM | POA: Diagnosis not present

## 2015-06-25 DIAGNOSIS — R740 Nonspecific elevation of levels of transaminase and lactic acid dehydrogenase [LDH]: Secondary | ICD-10-CM

## 2015-06-25 DIAGNOSIS — C9110 Chronic lymphocytic leukemia of B-cell type not having achieved remission: Secondary | ICD-10-CM

## 2015-06-25 DIAGNOSIS — R319 Hematuria, unspecified: Secondary | ICD-10-CM | POA: Diagnosis not present

## 2015-06-25 DIAGNOSIS — R300 Dysuria: Secondary | ICD-10-CM

## 2015-06-25 DIAGNOSIS — K5792 Diverticulitis of intestine, part unspecified, without perforation or abscess without bleeding: Secondary | ICD-10-CM

## 2015-06-25 DIAGNOSIS — R6883 Chills (without fever): Secondary | ICD-10-CM

## 2015-06-25 DIAGNOSIS — R7401 Elevation of levels of liver transaminase levels: Secondary | ICD-10-CM

## 2015-06-25 DIAGNOSIS — R109 Unspecified abdominal pain: Secondary | ICD-10-CM

## 2015-06-25 DIAGNOSIS — R112 Nausea with vomiting, unspecified: Secondary | ICD-10-CM

## 2015-06-25 DIAGNOSIS — K5712 Diverticulitis of small intestine without perforation or abscess without bleeding: Secondary | ICD-10-CM

## 2015-06-25 HISTORY — DX: Diverticulitis of intestine, part unspecified, without perforation or abscess without bleeding: K57.92

## 2015-06-25 LAB — MANUAL DIFFERENTIAL
ALC: 8.7 10*3/uL — AB (ref 0.9–3.3)
ANC (CHCC MAN DIFF): 8.9 10*3/uL — AB (ref 1.5–6.5)
Band Neutrophils: 0 % (ref 0–10)
Basophil: 0 % (ref 0–2)
Blasts: 0 % (ref 0–0)
EOS%: 2 % (ref 0–7)
LYMPH: 43 % (ref 14–49)
METAMYELOCYTES PCT: 1 % — AB (ref 0–0)
MONO: 11 % (ref 0–14)
Myelocytes: 0 % (ref 0–0)
Other Cell: 0 % (ref 0–0)
PLT EST: ADEQUATE
PROMYELO: 0 % (ref 0–0)
SEG: 43 % (ref 38–77)
VARIANT LYMPH: 0 % (ref 0–0)
nRBC: 0 % (ref 0–0)

## 2015-06-25 LAB — COMPREHENSIVE METABOLIC PANEL
ALBUMIN: 3.6 g/dL (ref 3.5–5.0)
ALK PHOS: 126 U/L (ref 40–150)
ALT: 68 U/L — AB (ref 0–55)
AST: 70 U/L — AB (ref 5–34)
Anion Gap: 5 mEq/L (ref 3–11)
BUN: 9.2 mg/dL (ref 7.0–26.0)
CALCIUM: 9 mg/dL (ref 8.4–10.4)
CO2: 27 mEq/L (ref 22–29)
CREATININE: 0.8 mg/dL (ref 0.6–1.1)
Chloride: 107 mEq/L (ref 98–109)
EGFR: 72 mL/min/{1.73_m2} — ABNORMAL LOW (ref >90)
Glucose: 78 mg/dl (ref 70–140)
Potassium: 4.2 mEq/L (ref 3.5–5.1)
Sodium: 138 mEq/L (ref 136–145)
Total Bilirubin: 0.43 mg/dL (ref 0.20–1.20)
Total Protein: 6.5 g/dL (ref 6.4–8.3)

## 2015-06-25 LAB — URINALYSIS, MICROSCOPIC - CHCC
Bilirubin (Urine): NEGATIVE
Glucose: NEGATIVE mg/dL
Ketones: NEGATIVE mg/dL
LEUKOCYTE ESTERASE: NEGATIVE
NITRITE: NEGATIVE
PROTEIN: NEGATIVE mg/dL
Specific Gravity, Urine: 1.01 (ref 1.003–1.035)
UROBILINOGEN UR: 0.2 mg/dL (ref 0.2–1)
pH: 8 (ref 4.6–8.0)

## 2015-06-25 LAB — CBC WITH DIFFERENTIAL/PLATELET
HCT: 38.7 % (ref 34.8–46.6)
HEMOGLOBIN: 12.6 g/dL (ref 11.6–15.9)
MCH: 28.8 pg (ref 25.1–34.0)
MCHC: 32.6 g/dL (ref 31.5–36.0)
MCV: 88.3 fL (ref 79.5–101.0)
PLATELETS: 151 10*3/uL (ref 145–400)
RBC: 4.38 10*6/uL (ref 3.70–5.45)
RDW: 12 % (ref 11.2–14.5)
WBC: 20.2 10*3/uL — AB (ref 3.9–10.3)

## 2015-06-25 MED ORDER — CIPROFLOXACIN HCL 500 MG PO TABS: 500.0000 mg | ORAL_TABLET | Freq: Two times a day (BID) | ORAL | Status: DC

## 2015-06-25 MED ORDER — HEPARIN SOD (PORK) LOCK FLUSH 100 UNIT/ML IV SOLN
500.0000 [IU] | INTRAVENOUS | Status: AC | PRN
  Administered 2015-06-25: 500 [IU]
  Filled 2015-06-25: qty 5

## 2015-06-25 MED ORDER — METRONIDAZOLE 500 MG PO TABS: 500.0000 mg | ORAL_TABLET | Freq: Three times a day (TID) | ORAL | Status: DC

## 2015-06-25 MED ORDER — SODIUM CHLORIDE 0.9 % IJ SOLN
10.0000 mL | INTRAMUSCULAR | Status: AC | PRN
  Administered 2015-06-25: 10 mL
  Filled 2015-06-25: qty 10

## 2015-06-25 NOTE — Telephone Encounter (Signed)
Pt called to this RN to state ongoing " very weak and not feeling good this morning "- she states severe chills occuring with temp of 98.7.  Pt is on Imbruvica with recent lab showing neutropenia last week with request to hold Imbruvica at this time.  This RN requested for pt to come in today for additional labs and visit today- Urgent POF sent and scheduler contacted.  Lab orders entered per neutropenic concerns.

## 2015-06-26 ENCOUNTER — Telehealth: Payer: Self-pay | Admitting: Oncology

## 2015-06-26 ENCOUNTER — Telehealth: Payer: Self-pay

## 2015-06-26 ENCOUNTER — Encounter: Payer: Self-pay | Admitting: Nurse Practitioner

## 2015-06-26 DIAGNOSIS — R112 Nausea with vomiting, unspecified: Secondary | ICD-10-CM | POA: Insufficient documentation

## 2015-06-26 DIAGNOSIS — R740 Nonspecific elevation of levels of transaminase and lactic acid dehydrogenase [LDH]: Secondary | ICD-10-CM

## 2015-06-26 DIAGNOSIS — R7401 Elevation of levels of liver transaminase levels: Secondary | ICD-10-CM | POA: Insufficient documentation

## 2015-06-26 DIAGNOSIS — E860 Dehydration: Secondary | ICD-10-CM | POA: Insufficient documentation

## 2015-06-26 DIAGNOSIS — M25561 Pain in right knee: Secondary | ICD-10-CM

## 2015-06-26 LAB — URINE CULTURE

## 2015-06-26 NOTE — Telephone Encounter (Signed)
Pt saw Cyndee Bacon yesterday and was Rx flagyl and cipro for diverticulitis. She took first 2 doses of flagyl today and 1st dose of cipro this morning. She has new pain today in R lateral upper leg from knee upward. This happens when she bends her knee, she said is stabbing, it will go away when knee is still.  She denies redness,swelling or heat to knee or leg. "it looks normal". She has new pain today in her R ear up to top of her head. This happened 2x and lasted for minutes each time. She does have a headache. She has a stye in her L eye. She read the side effects of her antibiotics and has gotten scared. Explained need for antibiotics with suspected infections when on chemotherapy. Will let Dr Jana Hakim and Selena Lesser know of pains.

## 2015-06-26 NOTE — Addendum Note (Signed)
Addended by: Jethro Bolus A on: 06/26/2015 03:15 PM   Modules accepted: Orders

## 2015-06-26 NOTE — Telephone Encounter (Signed)
sw pt regarding to 5.15 appt pt ok and aware

## 2015-06-26 NOTE — Assessment & Plan Note (Signed)
Patient has been complaining of some chronic nausea and vomited 1 today.  She feels slightly dehydrated.  She will receive IV fluid rehydration while the cancer Center today.  She'll also receive Zofran IV as well.  She was encouraged to push fluids at home as well.

## 2015-06-26 NOTE — Assessment & Plan Note (Signed)
Patient continues to take Ibrutinib oral therapy as directed.  Blood counts obtained today reveal a WBC of 20.2, and CEA.  0.9, hemoglobin 12.6, platelet count 151.  Most likely, elevated white count is secondary to new diagnosis of diverticulitis today.  Patient is also complaining of some decreased urination; and both urine and blood culture results are pending.  Reviewed all findings with Dr. Jana Hakim; he suggested the patient continue holding her oral chemotherapy for the next 2 weeks.  Patient will need to be scheduled for labs and a follow-up visit on 07/09/2015.

## 2015-06-26 NOTE — Telephone Encounter (Signed)
Ordered R leg doppler per Selena Lesser, FNP. Called pt back and advised of plan. Pt will have doppler done at [facility] on 5/3 at [place].

## 2015-06-26 NOTE — Assessment & Plan Note (Signed)
Patient states that she has been suffering with some chronic nausea for the past few days; and vomited 1 this morning.  She does have nausea medication at home.  Patient was given Zofran with her IV fluids today

## 2015-06-26 NOTE — Assessment & Plan Note (Signed)
AST is elevated to 70 and ALT is elevated to 68 today.  Patient is complaining of some new onset abdominal discomfort and back pain.  CT obtained today did reveal diverticulitis.  Will continue to monitor closely.

## 2015-06-26 NOTE — Progress Notes (Signed)
SYMPTOM MANAGEMENT CLINIC    Chief Complaint: Abdominal pain.  HPI:  Rachel [name] 65 y.o. female diagnosed with chronic lymphocytic leukemia. Currently undergoing Ibrutinib oral therapy.   Patient is complaining of some chronic nausea and one episode of vomiting earlier today.  She's also complaining of some generalized abdominal discomfort and some new onset lower back pain.  She denies any flank pain.  She also states that she has had decreased urination and feels dehydrated.  She denies any recent fevers or chills.  Exam today reveals some trace discomfort to the entire lower abdominal region; but no rebound tenderness.  Also, bowel sounds positive 4 quads.  There is no flank pain with palpation.  Labs obtained today did reveal a leukocytosis with a white count of 20.2.  Also, liver enzymes were elevated today as well.  CT without contrast obtained today did reveal diverticulitis.  Upon further questioning-patient states that she does have a history of chronic diverticulitis; was last hospitalized with diverticulitis in December 2016.  Reviewed all findings with Dr. Jana Hakim; he recommended prescribing both Cipro and Flagyl antibiotics for treatment of diverticulitis.  Also, patient was advised to try a clear liquid diet for the next few days for bowel rest.  Urinalysis revealed some moderate hematuria, but negative nitrites and negative leukocyte esterase.  It did show few bacteria as well.  Will await urine culture results.  Also, obtain blood cultures 2; and those results are pending as well.  Will prescribe patient both Cipro and Flagyl for treatment of diverticulitis as protocol  Patient was advised to call or go directly to the emergency department for any worsening symptoms whatsoever.     No history exists.    Review of Systems  Constitutional: Positive for malaise/fatigue.  Gastrointestinal: Positive for nausea, vomiting and abdominal pain.  Genitourinary:  Positive for frequency. Negative for dysuria, urgency, hematuria and flank pain.  Musculoskeletal: Positive for back pain.  All other systems reviewed and are negative.   Past Medical History  Diagnosis Date  . Cancer ([place])   . Leukemia ([place])   . Lower back pain   . Diverticulitis     Past Surgical History  Procedure Laterality Date  . Tonsillectomy    . Other surgical history      removal of uterine lining  . Lining of uterus removed    . Bunionectomy      right foot  . Breast surgery  2015    left breast bx    has CLL (chronic lymphocytic leukemia) ([place]); Headache disorder; Vaginal discharge; Low grade squamous intraepithelial lesion (LGSIL) on Papanicolaou smear of cervix; Diverticulitis; Dehydration; Transaminitis; and Nausea with vomiting on her problem list.    is allergic to penicillins.    Medication List       This list is accurate as of: 06/25/15 11:59 PM.  Always use your most recent med list.               acetaminophen 500 MG tablet  Commonly known as:  TYLENOL  Take 500 mg by mouth every 6 (six) hours as needed for headache.     acyclovir 400 MG tablet  Commonly known as:  ZOVIRAX  TAKE 1 TABLET TWICE A DAY     allopurinol 300 MG tablet  Commonly known as:  ZYLOPRIM  Take 1 tablet (300 mg total) by mouth daily.     aspirin 81 MG tablet  Take 81 mg by mouth daily.     cholecalciferol  1000 units tablet  Commonly known as:  VITAMIN D  Take 4,000 Units by mouth daily.     ciprofloxacin 500 MG tablet  Commonly known as:  CIPRO  Take 1 tablet (500 mg total) by mouth 2 (two) times daily.     CITRACAL PO  Take 4 tablets by mouth daily.     ELESTRIN 0.52 MG/0.87 GM (0.06%) Gel  Generic drug:  Estradiol  APPLY 2 PUMPS THINLY TO UPPER ARM DAILY AT BEDTIME     ibrutinib 140 MG capsul  Commonly known as:  IMBRUVICA  Take 3 capsules (420 mg total) by mouth daily.     lidocaine-prilocaine cream  Commonly known as:  EMLA  Apply a nickel size  amount to skin on top of port- cover with saran wrap at least 1 hour before access     metroNIDAZOLE 500 MG tablet  Commonly known as:  FLAGYL  Take 1 tablet (500 mg total) by mouth 3 (three) times daily.     ondansetron 4 MG disintegrating tablet  Commonly known as:  ZOFRAN ODT  Take 1 tablet (4 mg total) by mouth every 8 (eight) hours as needed for nausea or vomiting.     oxyCODONE-acetaminophen 5-325 MG tablet  Commonly known as:  PERCOCET/ROXICET  Take 2 tablets by mouth every 4 (four) hours as needed for severe pain.     prochlorperazine 10 MG tablet  Commonly known as:  COMPAZINE  Take 1 tablet (10 mg total) by mouth every 6 (six) hours as needed for nausea or vomiting.     progesterone 200 MG capsule  Commonly known as:  PROMETRIUM  TAKE 1 CAPSULE ON DAYS 1 THROUGH 12 EVERY OTHER MONTH     traZODone 50 MG tablet  Commonly known as:  DESYREL  Take 50 mg by mouth at bedtime.         PHYSICAL EXAMINATION  Oncology Vitals 06/25/2015 05/22/2015  Height 149 cm -  Weight 56.291 kg -  Weight (lbs) 124 lbs 2 oz -  BMI (kg/m2) 25.5 kg/m2 -  Temp 98.6 98.6  Pulse 62 67  Resp 18 -  SpO2 98 100  BSA (m2) 1.52 m2 -   BP Readings from Last 2 Encounters:  06/25/15 103/44  05/22/15 124/53    Physical Exam  Constitutional: She is oriented to person, place, and time and well-developed, well-nourished, and in no distress.  HENT:  Head: Normocephalic and atraumatic.  Mouth/Throat: Oropharynx is clear and moist.  Eyes: Conjunctivae and EOM are normal. Pupils are equal, round, and reactive to light. Right eye exhibits no discharge. Left eye exhibits no discharge. No scleral icterus.  Neck: Normal range of motion. Neck supple. No JVD present. No tracheal deviation present. No thyromegaly present.  Cardiovascular: Normal rate, regular rhythm, normal heart sounds and intact distal pulses.   Pulmonary/Chest: Effort normal and breath sounds normal. No respiratory distress. She has no  wheezes. She has no rales. She exhibits no tenderness.  Abdominal: Soft. Bowel sounds are normal. She exhibits distension. She exhibits no mass. There is tenderness. There is no rebound and no guarding.  Patient does appear to have some abdominal bloating; it was noted to be wearing her pants completely unbuttoned due to the bloating.  There was some trace tenderness to the lower abdominal region; but no rebound tenderness.  [place].  Positive 4.  There was no flank pain.  Musculoskeletal: Normal range of motion. She exhibits no edema or tenderness.  Lymphadenopathy:    She  has no cervical adenopathy.  Neurological: She is alert and oriented to person, place, and time. Gait normal.  Skin: Skin is warm and dry. No rash noted. No erythema. No pallor.  Psychiatric: Affect normal.  Nursing note and vitals reviewed.   LABORATORY DATA:. Appointment on 06/25/2015  Component Date Value Ref Range Status  . Glucose 06/25/2015 Negative  Negative mg/dL Final  . Bilirubin (Urine) 06/25/2015 Negative  Negative Final  . Ketones 06/25/2015 Negative  Negative mg/dL Final  . Specific Gravity, Urine 06/25/2015 1.010  1.003 - 1.035 Final  . Blood 06/25/2015 Moderate  Negative Final  . pH 06/25/2015 8.0  4.6 - 8.0 Final  . Protein 06/25/2015 Negative  Negative- <30 mg/dL Final  . Urobilinogen, UR 06/25/2015 0.2  0.2 - 1 mg/dL Final  . Nitrite 06/25/2015 Negative  Negative Final  . Leukocyte Esterase 06/25/2015 Negative  Negative Final  . RBC / HPF 06/25/2015 11-20  0 - 2 Final  . WBC, UA 06/25/2015 0-2  0 - 2 Final  . Bacteria, UA 06/25/2015 Few  Negative- Trace Final  . Epithelial Cells 06/25/2015 Occasional  Negative- Few Final  . WBC 06/25/2015 20.2* 3.9 - 10.3 10e3/uL Final  . HGB 06/25/2015 12.6  11.6 - 15.9 g/dL Final  . HCT 06/25/2015 38.7  34.8 - 46.6 % Final  . Platelets 06/25/2015 151  145 - 400 10e3/uL Final  . MCV 06/25/2015 88.3  79.5 - 101.0 fL Final  . MCH 06/25/2015 28.8  25.1 - 34.0  pg Final  . MCHC 06/25/2015 32.6  31.5 - 36.0 g/dL Final  . RBC 06/25/2015 4.38  3.70 - 5.45 10e6/uL Final  . RDW 06/25/2015 12.0  11.2 - 14.5 % Final  . Sodium 06/25/2015 138  136 - 145 mEq/L Final  . Potassium 06/25/2015 4.2  3.5 - 5.1 mEq/L Final  . Chloride 06/25/2015 107  98 - 109 mEq/L Final  . CO2 06/25/2015 27  22 - 29 mEq/L Final  . Glucose 06/25/2015 78  70 - 140 mg/dl Final   Glucose reference range is for nonfasting patients. Fasting glucose reference range is 70- 100.  Marland Kitchen BUN 06/25/2015 9.2  7.0 - 26.0 mg/dL Final  . Creatinine 06/25/2015 0.8  0.6 - 1.1 mg/dL Final  . Total Bilirubin 06/25/2015 0.43  0.20 - 1.20 mg/dL Final  . Alkaline Phosphatase 06/25/2015 126  40 - 150 U/L Final  . AST 06/25/2015 70* 5 - 34 U/L Final  . ALT 06/25/2015 68* 0 - 55 U/L Final  . Total Protein 06/25/2015 6.5  6.4 - 8.3 g/dL Final  . Albumin 06/25/2015 3.6  3.5 - 5.0 g/dL Final  . Calcium 06/25/2015 9.0  8.4 - 10.4 mg/dL Final  . Anion Gap 06/25/2015 5  3 - 11 mEq/L Final  . EGFR 06/25/2015 72* >90 ml/min/1.73 m2 Final   eGFR is calculated using the CKD-EPI Creatinine Equation (2009)  . ANC ([facility] manual diff) 06/25/2015 8.9* 1.5 - 6.5 10e3/uL Final  . ALC 06/25/2015 8.7* 0.9 - 3.3 10e3/uL Final  . SEG 06/25/2015 43  38 - 77 % Final  . Band Neutrophils 06/25/2015 0  0 - 10 % Final  . LYMPH 06/25/2015 43  14 - 49 % Final  . MONO 06/25/2015 11  0 - 14 % Final  . EOS 06/25/2015 2  0 - 7 % Final  . Basophil 06/25/2015 0  0 - 2 % Final  . Metamyelocytes 06/25/2015 1* 0 - 0 % Final  . Myelocytes  06/25/2015 0  0 - 0 % Final  . PROMYELO 06/25/2015 0  0 - 0 % Final  . Blasts 06/25/2015 0  0 - 0 % Final  . Variant Lymph 06/25/2015 0  0 - 0 % Final  . Other Cell 06/25/2015 0  0 - 0 % Final  . nRBC 06/25/2015 0  0 - 0 % Final  . Polychromasia 06/25/2015 Slight  Slight Final  . White Cell Comments 06/25/2015 Variant Lymphs- several large with nucleoli, moderate smudge cells   Final  . PLT EST  06/25/2015 Adequate  Adequate Final    RADIOGRAPHIC STUDIES: Ct Renal Stone Study  06/25/2015  CLINICAL DATA:  Patient with diffuse low back pain for multiple months. History of CLL. EXAM: CT ABDOMEN AND PELVIS WITHOUT CONTRAST TECHNIQUE: Multidetector CT imaging of the abdomen and pelvis was performed following the standard protocol without IV contrast. COMPARISON:  CT abdomen pelvis 02/18/2015 FINDINGS: Lower chest: Normal heart size. 3 mm left lower lobe pulmonary nodule, stable dating back to 06/04/2013, compatible with benign etiology. Hepatobiliary: Liver is normal in size and contour. Gallbladder is unremarkable. Pancreas: Unremarkable Spleen: Enlarged measuring 14.5 cm. Adrenals/Urinary Tract: Normal adrenal glands. Kidneys are symmetric in size. No hydronephrosis. Urinary bladder is unremarkable. Unchanged 1.5 cm low-attenuation lesion interpolar region left kidney. Stomach/Bowel: Sigmoid colonic diverticulosis. Mild inflammatory stranding about the sigmoid colon. Normal appendix. No evidence for bowel obstruction. Normal morphology to the stomach. Vascular/Lymphatic: Normal caliber abdominal aorta. Interval progression of retroperitoneal and pelvic lymphadenopathy with a reference left periaortic lymph node measuring 2.0 cm (image 43; series 2), previously 1.1 cm. Reference retrocaval lymph node measures 1.2 cm (image 41; series 2), previously 0.6 cm. Other: The uterus and adnexal structures are unremarkable. Musculoskeletal: Lower lumbar spine degenerative changes. No aggressive or acute appearing osseous lesions. IMPRESSION: Small amount of fat stranding about the sigmoid colon most compatible with acute sigmoid diverticulitis. Slight interval progression of extensive retroperitoneal adenopathy. Splenomegaly. These results will be called to the ordering clinician or representative by the Radiologist Assistant, and communication documented in the PACS or zVision Dashboard. Electronically Signed   By:  Lovey Newcomer M.D.   On: 06/25/2015 15:49    ASSESSMENT/PLAN:    Transaminitis AST is elevated to 70 and ALT is elevated to 68 today.  Patient is complaining of some new onset abdominal discomfort and back pain.  CT obtained today did reveal diverticulitis.  Will continue to monitor closely.  Nausea with vomiting Patient states that she has been suffering with some chronic nausea for the past few days; and vomited 1 this morning.  She does have nausea medication at home.  Patient was given Zofran with her IV fluids today  Diverticulitis Patient is complaining of some chronic nausea and one episode of vomiting earlier today.  She's also complaining of some generalized abdominal discomfort and some new onset lower back pain.  She denies any flank pain.  She also states that she has had decreased urination and feels dehydrated.  She denies any recent fevers or chills.  Exam today reveals some trace discomfort to the entire lower abdominal region; but no rebound tenderness.  Also, bowel sounds positive 4 quads.  There is no flank pain with palpation.  Labs obtained today did reveal a leukocytosis with a white count of 20.2.  Also, liver enzymes were elevated today as well.  CT without contrast obtained today did reveal diverticulitis.  Upon further questioning-patient states that she does have a history of chronic diverticulitis;  was last hospitalized with diverticulitis in December 2016.  Reviewed all findings with Dr. Jana Hakim; he recommended prescribing both Cipro and Flagyl antibiotics for treatment of diverticulitis.  Also, patient was advised to try a clear liquid diet for the next few days for bowel rest.  Urinalysis revealed some moderate hematuria, but negative nitrites and negative leukocyte esterase.  It did show few bacteria as well.  Will await urine culture results.  Also, obtain blood cultures 2; and those results are pending as well.  Will prescribe patient both Cipro and  Flagyl for treatment of diverticulitis as protocol  Patient was advised to call or go directly to the emergency department for any worsening symptoms whatsoever.    Dehydration Patient has been complaining of some chronic nausea and vomited 1 today.  She feels slightly dehydrated.  She will receive IV fluid rehydration while the cancer Center today.  She'll also receive Zofran IV as well.  She was encouraged to push fluids at home as well.  CLL (chronic lymphocytic leukemia) Patient continues to take Ibrutinib oral therapy as directed.  Blood counts obtained today reveal a WBC of 20.2, and CEA.  0.9, hemoglobin 12.6, platelet count 151.  Most likely, elevated white count is secondary to new diagnosis of diverticulitis today.  Patient is also complaining of some decreased urination; and both urine and blood culture results are pending.  Reviewed all findings with Dr. Jana Hakim; he suggested the patient continue holding her oral chemotherapy for the next 2 weeks.  Patient will need to be scheduled for labs and a follow-up visit on 07/09/2015.   Patient stated understanding of all instructions; and was in agreement with this plan of care. The patient knows to call the clinic with any problems, questions or concerns.   Total time spent with patient was 40 minutes;  with greater than 75 percent of that time spent in face to face counseling regarding patient's symptoms,  and coordination of care and follow up.  Disclaimer:This dictation was prepared with Dragon/digital dictation along with [employer]. Any transcriptional errors that result from this process are unintentional.  Drue Second, NP 06/26/2015

## 2015-06-26 NOTE — Assessment & Plan Note (Signed)
Patient is complaining of some chronic nausea and one episode of vomiting earlier today.  She's also complaining of some generalized abdominal discomfort and some new onset lower back pain.  She denies any flank pain.  She also states that she has had decreased urination and feels dehydrated.  She denies any recent fevers or chills.  Exam today reveals some trace discomfort to the entire lower abdominal region; but no rebound tenderness.  Also, bowel sounds positive 4 quads.  There is no flank pain with palpation.  Labs obtained today did reveal a leukocytosis with a white count of 20.2.  Also, liver enzymes were elevated today as well.  CT without contrast obtained today did reveal diverticulitis.  Upon further questioning-patient states that she does have a history of chronic diverticulitis; was last hospitalized with diverticulitis in December 2016.  Reviewed all findings with Dr. Jana Hakim; he recommended prescribing both Cipro and Flagyl antibiotics for treatment of diverticulitis.  Also, patient was advised to try a clear liquid diet for the next few days for bowel rest.  Urinalysis revealed some moderate hematuria, but negative nitrites and negative leukocyte esterase.  It did show few bacteria as well.  Will await urine culture results.  Also, obtain blood cultures 2; and those results are pending as well.  Will prescribe patient both Cipro and Flagyl for treatment of diverticulitis as protocol  Patient was advised to call or go directly to the emergency department for any worsening symptoms whatsoever.

## 2015-06-27 ENCOUNTER — Telehealth: Payer: Self-pay | Admitting: Nurse Practitioner

## 2015-06-27 ENCOUNTER — Ambulatory Visit (HOSPITAL_COMMUNITY)
Admission: RE | Admit: 2015-06-27 | Discharge: 2015-06-27 | Disposition: A | Discharge status: Home / Self Care | Payer: Medicare Other | Source: Ambulatory Visit | Attending: Nurse Practitioner | Admitting: Nurse Practitioner

## 2015-06-27 DIAGNOSIS — M25561 Pain in right knee: Secondary | ICD-10-CM

## 2015-06-27 NOTE — Progress Notes (Signed)
*  Preliminary Results* Right lower extremity venous duplex completed. Right lower extremity is negative for deep vein thrombosis. There is no evidence of right Baker's cyst.  06/27/2015 9:32 AM  Maudry Mayhew, RVT, RDCS, RDMS

## 2015-06-27 NOTE — Telephone Encounter (Signed)
Patient had called the cancer Center yesterday complaining of right lower leg discomfort.  Received report from Doppler ultrasound tech that Doppler ultrasound was negative for DVT.  Patient complaint to the technician that she actually has had some issues with cramping to her bilateral legs and feet; especially at night-for the last few months.  She denied any other new symptoms whatsoever.  Patient states that she's been eating more bananas recently to increase her potassium.  Of note-labs obtained earlier this week revealed no hypokalemia.  Patient was advised that she may try drinking some tonic water with quinine in it at night before bedtime to see if this helps with her chronic cramping of the legs.  Also, if symptoms persist.  Patient should call/return or go directly to the emergency department for further evaluation.

## 2015-06-28 ENCOUNTER — Telehealth: Payer: Self-pay | Admitting: *Deleted

## 2015-06-28 NOTE — Telephone Encounter (Signed)
TC from patient stating that the 2 antibiotics prescribed for her on Monday (Cipro and Flagyl) are upsetting her stomach quite a bit.  She is experiencing  Quite a bit of nausea and dizzyness -especially with the flagyl.  She has been on just fluids for the last few days but has added some oatmeal and noodles today.  Patient also asking about culture results from 06/25/15. Urine culture was negative and preliminary blood culture report also negative. Pt has compazine and omeprozole at home. She is asking if she needs to continue the antibiotics.

## 2015-06-29 NOTE — Telephone Encounter (Signed)
This RN returned call to pt this am and obtained identified VM. Per message this RN stated need to continue medications as ordered per diverticulitis.  This RN's name given to return call.

## 2015-07-01 LAB — CULTURE, BLOOD (SINGLE)

## 2015-07-09 ENCOUNTER — Ambulatory Visit (HOSPITAL_BASED_OUTPATIENT_CLINIC_OR_DEPARTMENT_OTHER): Payer: Medicare Other | Admitting: Nurse Practitioner

## 2015-07-09 ENCOUNTER — Encounter: Payer: Self-pay | Admitting: Nurse Practitioner

## 2015-07-09 ENCOUNTER — Other Ambulatory Visit (HOSPITAL_BASED_OUTPATIENT_CLINIC_OR_DEPARTMENT_OTHER): Payer: Medicare Other

## 2015-07-09 VITALS — BP 101/40 | HR 69 | Temp 98.5°F | Resp 18 | Ht 58.5 in | Wt 118.0 lb

## 2015-07-09 DIAGNOSIS — K5712 Diverticulitis of small intestine without perforation or abscess without bleeding: Secondary | ICD-10-CM | POA: Diagnosis not present

## 2015-07-09 DIAGNOSIS — C9110 Chronic lymphocytic leukemia of B-cell type not having achieved remission: Secondary | ICD-10-CM

## 2015-07-09 DIAGNOSIS — R32 Unspecified urinary incontinence: Secondary | ICD-10-CM | POA: Diagnosis not present

## 2015-07-09 LAB — CBC WITH DIFFERENTIAL/PLATELET
BASO%: 0.5 % (ref 0.0–2.0)
BASOS ABS: 0.1 10*3/uL (ref 0.0–0.1)
EOS ABS: 0.3 10*3/uL (ref 0.0–0.5)
EOS%: 1.5 % (ref 0.0–7.0)
HEMATOCRIT: 40.8 % (ref 34.8–46.6)
HEMOGLOBIN: 13.2 g/dL (ref 11.6–15.9)
LYMPH#: 14.9 10*3/uL — AB (ref 0.9–3.3)
LYMPH%: 76.4 % — ABNORMAL HIGH (ref 14.0–49.7)
MCH: 28.4 pg (ref 25.1–34.0)
MCHC: 32.3 g/dL (ref 31.5–36.0)
MCV: 87.8 fL (ref 79.5–101.0)
MONO#: 0 10*3/uL — ABNORMAL LOW (ref 0.1–0.9)
MONO%: 0.2 % (ref 0.0–14.0)
NEUT#: 4.2 10*3/uL (ref 1.5–6.5)
NEUT%: 21.4 % — AB (ref 38.4–76.8)
Platelets: 183 10*3/uL (ref 145–400)
RBC: 4.65 10*6/uL (ref 3.70–5.45)
RDW: 12.2 % (ref 11.2–14.5)
WBC: 19.6 10*3/uL — ABNORMAL HIGH (ref 3.9–10.3)

## 2015-07-09 LAB — COMPREHENSIVE METABOLIC PANEL
ALBUMIN: 3.7 g/dL (ref 3.5–5.0)
ALK PHOS: 92 U/L (ref 40–150)
ALT: 26 U/L (ref 0–55)
AST: 30 U/L (ref 5–34)
Anion Gap: 6 mEq/L (ref 3–11)
BUN: 10.8 mg/dL (ref 7.0–26.0)
CALCIUM: 9.4 mg/dL (ref 8.4–10.4)
CHLORIDE: 108 meq/L (ref 98–109)
CO2: 27 mEq/L (ref 22–29)
Creatinine: 0.9 mg/dL (ref 0.6–1.1)
EGFR: 69 mL/min/{1.73_m2} — AB (ref >90)
Glucose: 77 mg/dl (ref 70–140)
POTASSIUM: 4.3 meq/L (ref 3.5–5.1)
Sodium: 141 mEq/L (ref 136–145)
Total Bilirubin: 0.37 mg/dL (ref 0.20–1.20)
Total Protein: 7 g/dL (ref 6.4–8.3)

## 2015-07-09 LAB — TECHNOLOGIST REVIEW

## 2015-07-09 NOTE — Progress Notes (Signed)
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 She has been treated with Rituxan, ofatumumab, cladribine, bendamustine and currently with ibrutinib, with details summarized below.  INTERVAL HISTORY: [name] returns today for follow up of her chronic lymphoid leukemia. The ibrutinib has been on hold for 2 weeks because she is being treated with diverticulitis. She completed her course of cipro and flagyl, but she struggled in doing so. The drugs made her nauseous and dizzy. Her abdominal pain improved initially, but starting this weekend, she noticed another flare up. Her left lower quadrant is sore, but not cramping. She is now on a fairly liquid diet with soft foods on rare occasions, which helps. She had a fever on Sunday, but his resolved.   REVIEW OF SYSTEMS: [name] has occasional headaches, sinus issues, stress urinary incontinence, and feels a little forgetful. She has moderate hot flashes. She is very active participating in [employer] class several times weekly. A detailed review of systems today was otherwise noncontributory   PAST MEDICAL HISTORY: Past Medical History  Diagnosis Date  . Cancer ([place])   . Leukemia ([place])   . Lower back pain   . Diverticulitis     PAST SURGICAL HISTORY: Past Surgical History  Procedure Laterality Date  . Tonsillectomy    . Other surgical history      removal of uterine lining  . Lining of uterus removed    . Bunionectomy      right foot  . Breast surgery  2015    left breast bx    FAMILY HISTORY Family History  Problem Relation Age of Onset  . Depression Son     GYNECOLOGIC  HISTORY: Menarche age 10, first live birth age 9, she is [place] P3. She stopped having periods in 1999 after endometrial stripping. She continues on hormone replacement.  SOCIAL HISTORY: [name] is retired from [employer]. Her son [name] lives in [place] and has 3 sons; he works for a Pharmacist, community. A second son lives in [place] and has two children. Son [name] lives in [place] and has a daughter, as well as 2 sons by marriage.   ADVANCED DIRECTIVES: In place  HEALTH MAINTENANCE: Social History  Substance Use Topics  . Smoking status: Never Smoker   . Smokeless tobacco: Never Used  . Alcohol Use: 0.0 oz/week    0 Standard drinks or equivalent per week     Comment: socially     Colonoscopy: due  PAP: December 2012/ [name]  Bone density: Due  Lipid panel: per Dr Drema Dallas  Allergies  Allergen Reactions  . Penicillins Anaphylaxis    "stopped breathing" "drops my heartbeat" "I just pass out"    Current Outpatient Prescriptions  Medication Sig Dispense Refill  . acetaminophen (TYLENOL) 500 MG tablet Take 500 mg by mouth every 6 (six) hours as needed for headache.    Marland Kitchen acyclovir (ZOVIRAX) 400 MG tablet TAKE 1 TABLET TWICE A DAY 180 tablet 3  . allopurinol (ZYLOPRIM) 300 MG tablet Take 1 tablet (300 mg total) by mouth daily. 90 tablet 3  . aspirin 81 MG tablet Take 81 mg by mouth daily.    . Calcium Citrate (CITRACAL PO) Take 4  tablets by mouth daily.     . cholecalciferol (VITAMIN D) 1000 UNITS tablet Take 4,000 Units by mouth daily.    Marland Kitchen ELESTRIN 0.52 MG/0.87 GM (0.06%) GEL APPLY 2 PUMPS THINLY TO UPPER ARM DAILY AT BEDTIME 156 g 3  . lidocaine-prilocaine (EMLA) cream Apply a nickel size amount to skin on top of port- cover with saran wrap at least 1 hour before access 30 g 0  . progesterone (PROMETRIUM) 200 MG capsule TAKE 1 CAPSULE ON DAYS 1 THROUGH 12 EVERY OTHER MONTH 24 capsule 1  . traZODone (DESYREL) 50 MG tablet Take 50 mg by mouth at bedtime.    Marland Kitchen  ibrutinib (IMBRUVICA) 140 MG capsul Take 3 capsules (420 mg total) by mouth daily. (Patient not taking: Reported on 07/09/2015) 270 capsule 1  . ondansetron (ZOFRAN ODT) 4 MG disintegrating tablet Take 1 tablet (4 mg total) by mouth every 8 (eight) hours as needed for nausea or vomiting. (Patient not taking: Reported on 03/06/2015) 10 tablet 0  . oxyCODONE-acetaminophen (PERCOCET/ROXICET) 5-325 MG tablet Take 2 tablets by mouth every 4 (four) hours as needed for severe pain. (Patient not taking: Reported on 03/06/2015) 10 tablet 0  . prochlorperazine (COMPAZINE) 10 MG tablet Take 1 tablet (10 mg total) by mouth every 6 (six) hours as needed for nausea or vomiting. (Patient not taking: Reported on 03/06/2015) 30 tablet 0   No current facility-administered medications for this visit.    OBJECTIVE: Middle-aged white woman  Filed Vitals:   07/09/15 1102  BP: 101/40  Pulse: 69  Temp: 98.5 F (36.9 C)  Resp: 18   Filed Weights   07/09/15 1102  Weight: 118 lb (53.524 kg)   Body mass index is 24.24 kg/(m^2).    ECOG FS: 1  Skin: warm, dry  HEENT: sclerae anicteric, conjunctivae pink, oropharynx clear. No thrush or mucositis.  Lymph Nodes: No cervical or supraclavicular lymphadenopathy  Lungs: clear to auscultation bilaterally, no rales, wheezes, or rhonci  Heart: regular rate and rhythm  Abdomen: round, soft, mild tenderness to lower left quadrant, no rebound tenderness, positive bowel sounds  Musculoskeletal: No focal spinal tenderness, no peripheral edema  Neuro: non focal, well oriented, positive affect  Breasts: deferred   LAB RESULTS:  Lab Results  Component Value Date   WBC 19.6* 07/09/2015   NEUTROABS 4.2 07/09/2015   HGB 13.2 07/09/2015   HCT 40.8 07/09/2015   MCV 87.8 07/09/2015   PLT 183 07/09/2015      Chemistry      Component Value Date/Time   NA 141 07/09/2015 1023   NA 135 02/18/2015 2200   K 4.3 07/09/2015 1023   K 4.2 02/18/2015 2200   CL 101 02/18/2015 2200    CL 101 12/15/2011 1322   CO2 27 07/09/2015 1023   CO2 25 02/18/2015 2200   BUN 10.8 07/09/2015 1023   BUN 10 02/18/2015 2200   CREATININE 0.9 07/09/2015 1023   CREATININE 0.91 02/18/2015 2200      Component Value Date/Time   CALCIUM 9.4 07/09/2015 1023   CALCIUM 9.0 02/18/2015 2200   ALKPHOS 92 07/09/2015 1023   ALKPHOS 90 02/18/2015 2200   AST 30 07/09/2015 1023   AST 36 02/18/2015 2200   ALT 26 07/09/2015 1023   ALT 32 02/18/2015 2200   BILITOT 0.37 07/09/2015 1023   BILITOT 1.0 02/18/2015 2200     STUDIES: Ct Renal Stone Study  06/25/2015  CLINICAL DATA:  Patient with diffuse low back pain for multiple months. History  of CLL. EXAM: CT ABDOMEN AND PELVIS WITHOUT CONTRAST TECHNIQUE: Multidetector CT imaging of the abdomen and pelvis was performed following the standard protocol without IV contrast. COMPARISON:  CT abdomen pelvis 02/18/2015 FINDINGS: Lower chest: Normal heart size. 3 mm left lower lobe pulmonary nodule, stable dating back to 06/04/2013, compatible with benign etiology. Hepatobiliary: Liver is normal in size and contour. Gallbladder is unremarkable. Pancreas: Unremarkable Spleen: Enlarged measuring 14.5 cm. Adrenals/Urinary Tract: Normal adrenal glands. Kidneys are symmetric in size. No hydronephrosis. Urinary bladder is unremarkable. Unchanged 1.5 cm low-attenuation lesion interpolar region left kidney. Stomach/Bowel: Sigmoid colonic diverticulosis. Mild inflammatory stranding about the sigmoid colon. Normal appendix. No evidence for bowel obstruction. Normal morphology to the stomach. Vascular/Lymphatic: Normal caliber abdominal aorta. Interval progression of retroperitoneal and pelvic lymphadenopathy with a reference left periaortic lymph node measuring 2.0 cm (image 43; series 2), previously 1.1 cm. Reference retrocaval lymph node measures 1.2 cm (image 41; series 2), previously 0.6 cm. Other: The uterus and adnexal structures are unremarkable. Musculoskeletal: Lower lumbar  spine degenerative changes. No aggressive or acute appearing osseous lesions. IMPRESSION: Small amount of fat stranding about the sigmoid colon most compatible with acute sigmoid diverticulitis. Slight interval progression of extensive retroperitoneal adenopathy. Splenomegaly. These results will be called to the ordering clinician or representative by the Radiologist Assistant, and communication documented in the PACS or zVision Dashboard. Electronically Signed   By: Lovey Newcomer M.D.   On: 06/25/2015 15:49     ASSESSMENT: 65 y.o.  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 rigors 02/27/2014: received IV vancomycin and levaquin briefly, then levaquin po (pcn allergy)  (6) bendamustine/ rituximab for 6 cycles, completed 05/04/2014, with improvement but not normalization of the absolute lymphocyte count  (7) ibrutinib started 05/08/2014, taken with some irregularity, currently at 420 mg daily  PLAN: [name] is somewhat improved, but her diverticulitis symptoms continue to linger. She does not want to go on another short course of antibiotics. She prefers bowel rest with a mostly liquid/soft diet. We will hold off on starting the ibrutinib until next week in this case. She is already scheduled for labs next week. She is concerned that her WBC is elevated today at 19.6,  but that is not entirely different than her counts from 2 weeks ago. We are confident that when she starts back on the ibrutinib, she will trend back downwards as this drug has worked well for her in the past.   [name] will wait until she hears from Dr. Virgie Dad nurse on Tuesday before restarting this drug. I want to make sure that her diverticulitis has cleared. She is scheduled for follow up with Dr. Jana Hakim in 4 months. She understands and agrees with this plan. She knows the goal of treatment in her case is control. She has been encouraged to call with any issues that might arise before her next visit here.  Laurie Panda, NP 07/09/2015

## 2015-07-17 ENCOUNTER — Other Ambulatory Visit: Payer: Self-pay | Admitting: Nurse Practitioner

## 2015-07-17 ENCOUNTER — Other Ambulatory Visit (HOSPITAL_BASED_OUTPATIENT_CLINIC_OR_DEPARTMENT_OTHER): Payer: Medicare Other

## 2015-07-17 ENCOUNTER — Telehealth: Payer: Self-pay

## 2015-07-17 ENCOUNTER — Telehealth: Payer: Self-pay | Admitting: *Deleted

## 2015-07-17 DIAGNOSIS — C9110 Chronic lymphocytic leukemia of B-cell type not having achieved remission: Secondary | ICD-10-CM | POA: Diagnosis present

## 2015-07-17 LAB — COMPREHENSIVE METABOLIC PANEL
ALBUMIN: 3.6 g/dL (ref 3.5–5.0)
ALK PHOS: 87 U/L (ref 40–150)
ALT: 18 U/L (ref 0–55)
ANION GAP: 9 meq/L (ref 3–11)
AST: 25 U/L (ref 5–34)
BILIRUBIN TOTAL: 0.53 mg/dL (ref 0.20–1.20)
BUN: 13.7 mg/dL (ref 7.0–26.0)
CALCIUM: 9.1 mg/dL (ref 8.4–10.4)
CO2: 25 mEq/L (ref 22–29)
CREATININE: 1.1 mg/dL (ref 0.6–1.1)
Chloride: 106 mEq/L (ref 98–109)
EGFR: 50 mL/min/{1.73_m2} — ABNORMAL LOW (ref >90)
Glucose: 165 mg/dl — ABNORMAL HIGH (ref 70–140)
Potassium: 4 mEq/L (ref 3.5–5.1)
Sodium: 140 mEq/L (ref 136–145)
TOTAL PROTEIN: 6.4 g/dL (ref 6.4–8.3)

## 2015-07-17 LAB — CBC WITH DIFFERENTIAL/PLATELET
BASO%: 1 % (ref 0.0–2.0)
Basophils Absolute: 0.2 10*3/uL — ABNORMAL HIGH (ref 0.0–0.1)
EOS ABS: 0.3 10*3/uL (ref 0.0–0.5)
EOS%: 1.5 % (ref 0.0–7.0)
HEMATOCRIT: 39.6 % (ref 34.8–46.6)
HEMOGLOBIN: 12.9 g/dL (ref 11.6–15.9)
LYMPH%: 81.3 % — AB (ref 14.0–49.7)
MCH: 28.5 pg (ref 25.1–34.0)
MCHC: 32.5 g/dL (ref 31.5–36.0)
MCV: 87.6 fL (ref 79.5–101.0)
MONO#: 0.3 10*3/uL (ref 0.1–0.9)
MONO%: 1.6 % (ref 0.0–14.0)
NEUT%: 14.6 % — ABNORMAL LOW (ref 38.4–76.8)
NEUTROS ABS: 2.6 10*3/uL (ref 1.5–6.5)
PLATELETS: 156 10*3/uL (ref 145–400)
RBC: 4.52 10*6/uL (ref 3.70–5.45)
RDW: 12.4 % (ref 11.2–14.5)
WBC: 17.7 10*3/uL — AB (ref 3.9–10.3)
lymph#: 14.4 10*3/uL — ABNORMAL HIGH (ref 0.9–3.3)

## 2015-07-17 LAB — TECHNOLOGIST REVIEW

## 2015-07-17 NOTE — Telephone Encounter (Signed)
Pt called stating Heather instructed her to restart her imbruvica. Pt was asking if she should decrease the quantity from 3 /day to 2/day so she does not become as neutropenic. Pt stated this was discussed earlier as a possibility. S/w Nira Conn and she requested we ask Dr Jana Hakim.

## 2015-07-17 NOTE — Telephone Encounter (Signed)
Pt told to take the imbruvica 3x daily and her lab work will be recheck in 4 weeks.  At that time they will discuss if she should decrease her imbruvica to 2x daily.  Pt verbalized understanding.

## 2015-07-20 ENCOUNTER — Telehealth: Payer: Self-pay | Admitting: *Deleted

## 2015-07-20 ENCOUNTER — Other Ambulatory Visit: Payer: Self-pay | Admitting: Obstetrics

## 2015-07-20 DIAGNOSIS — F419 Anxiety disorder, unspecified: Secondary | ICD-10-CM

## 2015-07-20 MED ORDER — TRAZODONE HCL 50 MG PO TABS: 50.0000 mg | ORAL_TABLET | Freq: Every day | ORAL | Status: DC

## 2015-07-20 NOTE — Telephone Encounter (Signed)
Pt called to office regarding Trazodone Rx.   Return call to pt.  Pt states that she needs to continue on her Trazodone.  Pt does not have a current Rx on file, it is over 1 year.  Can pt have new Rx with refills sent to her pharmacy?  Pt states she currently has a couple weeks supply left.  Pt also would like to know about her pap smear testing.  She has had abnormal in past and was being screened every 6 months. Her pap in January was normal. Does she need to repeat again in 6 months from then or is she on 1 year regimen for testing.  Please advise.

## 2015-07-20 NOTE — Telephone Encounter (Signed)
Trazodone Rx Needs pap smear yearly.

## 2015-07-26 ENCOUNTER — Telehealth: Payer: Self-pay | Admitting: *Deleted

## 2015-07-26 ENCOUNTER — Telehealth: Payer: Self-pay | Admitting: Oncology

## 2015-07-26 NOTE — Telephone Encounter (Signed)
"  I need to reschedule June 20th lab to August 13, 2015 in the morning after 0800 AND reschedule the September 11, 2015 appointment to September 17, 2015. I am flying to [place] 08-13-2015 through 09-11-2015.  Unable to transfer to live scheduler at this time.  P.O.F generated.

## 2015-07-26 NOTE — Telephone Encounter (Signed)
spoke w/ pt confirmed 6/19 & 7/24 apts

## 2015-07-31 DIAGNOSIS — G514 Facial myokymia: Secondary | ICD-10-CM | POA: Diagnosis not present

## 2015-07-31 DIAGNOSIS — H353131 Nonexudative age-related macular degeneration, bilateral, early dry stage: Secondary | ICD-10-CM | POA: Diagnosis not present

## 2015-07-31 DIAGNOSIS — H43813 Vitreous degeneration, bilateral: Secondary | ICD-10-CM | POA: Diagnosis not present

## 2015-07-31 DIAGNOSIS — H2513 Age-related nuclear cataract, bilateral: Secondary | ICD-10-CM | POA: Diagnosis not present

## 2015-07-31 DIAGNOSIS — H02402 Unspecified ptosis of left eyelid: Secondary | ICD-10-CM | POA: Diagnosis not present

## 2015-07-31 DIAGNOSIS — H04123 Dry eye syndrome of bilateral lacrimal glands: Secondary | ICD-10-CM | POA: Diagnosis not present

## 2015-08-08 ENCOUNTER — Telehealth: Payer: Self-pay | Admitting: *Deleted

## 2015-08-08 NOTE — Telephone Encounter (Signed)
TC from patient stating that she is feeling anxious today with slight chest pressure. Denies chest pain, shortness of breath. Denies any pain at all except for the 'usual aches and pains of being 65 years old'. Patient states she was watching the news and feeling increasingly anxious with world events. Advised patient to turn off TV and re-focus on things that bring her pleasure-to go outside, play with grandchildren. Pt has Yoga @ 11 am. Encouraged pt to attend and this will help with re-focusing. Suggested [place] and focus on the things that are relaxing and beautiful in the world.  Pt voiced understanding-she stated she just needed some encouragement in an unsettled world.  Advised to call back if the feelings of anxiety do not diminish over the course of the day or if develops any other symptoms that are worrisome to her.   Patient takes her BP at home and today BP was elevated above her baseline. Baseline 100/40 to 116/60. Today was 128/77 with heart reate of 65. Again encouraged relaxing activities, deep breathing etc. Patient voiced understanding.

## 2015-08-08 NOTE — Telephone Encounter (Signed)
Thank you :)

## 2015-08-10 NOTE — Telephone Encounter (Signed)
Pt was able to get Trazodone Rx. Pt states that she normally takes 200mg  q HS.  Pt states that she only received 50mg  once daily. Pt would like to know if we are able to change Rx to reflect 200mg  at night.  Please advise

## 2015-08-13 ENCOUNTER — Other Ambulatory Visit (HOSPITAL_BASED_OUTPATIENT_CLINIC_OR_DEPARTMENT_OTHER): Payer: Medicare Other

## 2015-08-13 ENCOUNTER — Other Ambulatory Visit: Payer: Self-pay | Admitting: Oncology

## 2015-08-13 DIAGNOSIS — C9110 Chronic lymphocytic leukemia of B-cell type not having achieved remission: Secondary | ICD-10-CM | POA: Diagnosis present

## 2015-08-13 LAB — CBC WITH DIFFERENTIAL/PLATELET
BASO%: 0.2 % (ref 0.0–2.0)
BASOS ABS: 0 10*3/uL (ref 0.0–0.1)
EOS ABS: 0.1 10*3/uL (ref 0.0–0.5)
EOS%: 0.8 % (ref 0.0–7.0)
HCT: 40.1 % (ref 34.8–46.6)
HGB: 12.8 g/dL (ref 11.6–15.9)
LYMPH%: 82.6 % — AB (ref 14.0–49.7)
MCH: 28.7 pg (ref 25.1–34.0)
MCHC: 31.9 g/dL (ref 31.5–36.0)
MCV: 89.9 fL (ref 79.5–101.0)
MONO#: 0.5 10*3/uL (ref 0.1–0.9)
MONO%: 2.7 % (ref 0.0–14.0)
NEUT#: 2.4 10*3/uL (ref 1.5–6.5)
NEUT%: 13.7 % — ABNORMAL LOW (ref 38.4–76.8)
Platelets: 111 10*3/uL — ABNORMAL LOW (ref 145–400)
RBC: 4.46 10*6/uL (ref 3.70–5.45)
RDW: 13.9 % (ref 11.2–14.5)
WBC: 17.2 10*3/uL — AB (ref 3.9–10.3)
lymph#: 14.2 10*3/uL — ABNORMAL HIGH (ref 0.9–3.3)

## 2015-08-13 LAB — COMPREHENSIVE METABOLIC PANEL
ALK PHOS: 55 U/L (ref 40–150)
ALT: 17 U/L (ref 0–55)
AST: 20 U/L (ref 5–34)
Albumin: 3.8 g/dL (ref 3.5–5.0)
Anion Gap: 8 mEq/L (ref 3–11)
BILIRUBIN TOTAL: 0.78 mg/dL (ref 0.20–1.20)
BUN: 9.7 mg/dL (ref 7.0–26.0)
CHLORIDE: 108 meq/L (ref 98–109)
CO2: 27 mEq/L (ref 22–29)
CREATININE: 0.9 mg/dL (ref 0.6–1.1)
Calcium: 9.1 mg/dL (ref 8.4–10.4)
EGFR: 64 mL/min/{1.73_m2} — ABNORMAL LOW (ref >90)
Glucose: 106 mg/dl (ref 70–140)
Potassium: 3.8 mEq/L (ref 3.5–5.1)
Sodium: 142 mEq/L (ref 136–145)
Total Protein: 6.4 g/dL (ref 6.4–8.3)

## 2015-08-13 NOTE — Progress Notes (Unsigned)
Rachel [name] dropped in today because she was having some bruises. She does indeed have some bruising including the volar aspect of the right forearm and a couple of others were more peripheral. She has not fallen and is not aware of any trauma.  Her platelet count is over 100,000.  I suggested she stop her aspirin and see if the problem goes away as I suspected well.  Otherwise we are continuing the Imbruvic as before. Her counts continue to be well-controlled.  Incidentally she tells me when she takes the imbruvica for warts go away. When she stops the improving Towards return. I don't think this has been reported.  She will be in [facility] for a month. She is already scheduled to see me at the end of that.Marland Kitchen

## 2015-08-14 ENCOUNTER — Other Ambulatory Visit: Payer: Medicare Other

## 2015-08-14 NOTE — Telephone Encounter (Signed)
I can't change Rx.

## 2015-08-28 ENCOUNTER — Telehealth: Payer: Self-pay | Admitting: Hematology

## 2015-08-28 DIAGNOSIS — N390 Urinary tract infection, site not specified: Secondary | ICD-10-CM | POA: Diagnosis not present

## 2015-08-28 DIAGNOSIS — R300 Dysuria: Secondary | ICD-10-CM | POA: Diagnosis not present

## 2015-08-28 DIAGNOSIS — N3001 Acute cystitis with hematuria: Secondary | ICD-10-CM | POA: Diagnosis not present

## 2015-08-28 NOTE — Telephone Encounter (Signed)
Patient called answering service today to report urinary frequency, intermittent headaches, mild dry cough for the past 2-3 days. No fever or chills, no sputum production, no dysuria or hematuria, she has not taking any medications. She is currently in [place], [place] for a trip.  I advised her to go to emergency room or urgent care for lab work to ruled out infection, especially UTI, and call us when she returns from [place].  Truitt Merle  08/28/2015

## 2015-08-29 ENCOUNTER — Telehealth: Payer: Self-pay | Admitting: *Deleted

## 2015-08-29 NOTE — Telephone Encounter (Signed)
This RN returned call to pt per message transferred from [facility]. Brittinee stated " I have blood in my urine but not increased white cells and the doctor here is very very concerned. You should have received information on your fax"  " Please call me as soon as you can "  Per given number of [phone]- phone rang with no answer nor VM- line then disconnected.  Noted U/A results and noted stating pt has been started on macrobid.  This RN will attempt to reach pt again at later time.

## 2015-08-29 NOTE — Telephone Encounter (Signed)
Received call from patient. Patient will get macrobid from pharmacy to take. Per MD Magrinat, patient should stop taking her imbruvica for 1 week and the resume at same dose. Patient verbalized understanding.

## 2015-09-05 DIAGNOSIS — R3129 Other microscopic hematuria: Secondary | ICD-10-CM | POA: Diagnosis not present

## 2015-09-05 DIAGNOSIS — N390 Urinary tract infection, site not specified: Secondary | ICD-10-CM | POA: Diagnosis not present

## 2015-09-11 ENCOUNTER — Other Ambulatory Visit: Payer: Medicare Other

## 2015-09-17 ENCOUNTER — Other Ambulatory Visit: Payer: Medicare Other

## 2015-09-20 ENCOUNTER — Other Ambulatory Visit (HOSPITAL_BASED_OUTPATIENT_CLINIC_OR_DEPARTMENT_OTHER): Payer: Medicare Other

## 2015-09-20 DIAGNOSIS — C9110 Chronic lymphocytic leukemia of B-cell type not having achieved remission: Secondary | ICD-10-CM

## 2015-09-20 LAB — COMPREHENSIVE METABOLIC PANEL
ALBUMIN: 3.9 g/dL (ref 3.5–5.0)
ALK PHOS: 100 U/L (ref 40–150)
ALT: 14 U/L (ref 0–55)
AST: 18 U/L (ref 5–34)
Anion Gap: 10 mEq/L (ref 3–11)
BILIRUBIN TOTAL: 0.59 mg/dL (ref 0.20–1.20)
BUN: 13.3 mg/dL (ref 7.0–26.0)
CALCIUM: 9 mg/dL (ref 8.4–10.4)
CO2: 25 mEq/L (ref 22–29)
Chloride: 103 mEq/L (ref 98–109)
Creatinine: 1 mg/dL (ref 0.6–1.1)
EGFR: 58 mL/min/{1.73_m2} — AB (ref >90)
GLUCOSE: 80 mg/dL (ref 70–140)
POTASSIUM: 4.1 meq/L (ref 3.5–5.1)
Sodium: 138 mEq/L (ref 136–145)
TOTAL PROTEIN: 6.7 g/dL (ref 6.4–8.3)

## 2015-09-20 LAB — CBC WITH DIFFERENTIAL/PLATELET
BASO%: 0.2 % (ref 0.0–2.0)
BASOS ABS: 0.1 10*3/uL (ref 0.0–0.1)
EOS ABS: 0.2 10*3/uL (ref 0.0–0.5)
EOS%: 0.6 % (ref 0.0–7.0)
HEMATOCRIT: 39.2 % (ref 34.8–46.6)
HEMOGLOBIN: 12.9 g/dL (ref 11.6–15.9)
LYMPH#: 28.5 10*3/uL — AB (ref 0.9–3.3)
LYMPH%: 80.1 % — ABNORMAL HIGH (ref 14.0–49.7)
MCH: 28.9 pg (ref 25.1–34.0)
MCHC: 32.9 g/dL (ref 31.5–36.0)
MCV: 87.7 fL (ref 79.5–101.0)
MONO#: 1.3 10*3/uL — ABNORMAL HIGH (ref 0.1–0.9)
MONO%: 3.6 % (ref 0.0–14.0)
NEUT%: 15.5 % — ABNORMAL LOW (ref 38.4–76.8)
NEUTROS ABS: 5.5 10*3/uL (ref 1.5–6.5)
Platelets: 165 10*3/uL (ref 145–400)
RBC: 4.47 10*6/uL (ref 3.70–5.45)
RDW: 14 % (ref 11.2–14.5)
WBC: 35.6 10*3/uL — AB (ref 3.9–10.3)

## 2015-09-20 LAB — TECHNOLOGIST REVIEW

## 2015-10-09 ENCOUNTER — Other Ambulatory Visit: Payer: Medicare Other

## 2015-10-17 ENCOUNTER — Other Ambulatory Visit: Payer: Self-pay | Admitting: Obstetrics

## 2015-10-18 ENCOUNTER — Other Ambulatory Visit: Payer: Self-pay | Admitting: *Deleted

## 2015-10-18 DIAGNOSIS — C9110 Chronic lymphocytic leukemia of B-cell type not having achieved remission: Secondary | ICD-10-CM

## 2015-10-19 ENCOUNTER — Encounter: Payer: Self-pay | Admitting: *Deleted

## 2015-10-19 ENCOUNTER — Telehealth: Payer: Self-pay | Admitting: Oncology

## 2015-10-19 ENCOUNTER — Ambulatory Visit (HOSPITAL_BASED_OUTPATIENT_CLINIC_OR_DEPARTMENT_OTHER): Payer: Medicare Other | Admitting: Oncology

## 2015-10-19 ENCOUNTER — Ambulatory Visit (HOSPITAL_BASED_OUTPATIENT_CLINIC_OR_DEPARTMENT_OTHER): Payer: Medicare Other

## 2015-10-19 ENCOUNTER — Other Ambulatory Visit (HOSPITAL_BASED_OUTPATIENT_CLINIC_OR_DEPARTMENT_OTHER): Payer: Medicare Other

## 2015-10-19 VITALS — BP 109/61 | HR 97 | Temp 98.0°F | Resp 18 | Wt 121.3 lb

## 2015-10-19 DIAGNOSIS — Z95828 Presence of other vascular implants and grafts: Secondary | ICD-10-CM | POA: Insufficient documentation

## 2015-10-19 DIAGNOSIS — C9110 Chronic lymphocytic leukemia of B-cell type not having achieved remission: Secondary | ICD-10-CM

## 2015-10-19 LAB — CBC WITH DIFFERENTIAL/PLATELET
BASO%: 0.3 % (ref 0.0–2.0)
BASOS ABS: 0.1 10*3/uL (ref 0.0–0.1)
EOS ABS: 0.1 10*3/uL (ref 0.0–0.5)
EOS%: 0.7 % (ref 0.0–7.0)
HCT: 38.8 % (ref 34.8–46.6)
HGB: 12.6 g/dL (ref 11.6–15.9)
LYMPH#: 17.4 10*3/uL — AB (ref 0.9–3.3)
LYMPH%: 80.3 % — ABNORMAL HIGH (ref 14.0–49.7)
MCH: 28.8 pg (ref 25.1–34.0)
MCHC: 32.5 g/dL (ref 31.5–36.0)
MCV: 88.5 fL (ref 79.5–101.0)
MONO#: 0.7 10*3/uL (ref 0.1–0.9)
MONO%: 3.2 % (ref 0.0–14.0)
NEUT#: 3.4 10*3/uL (ref 1.5–6.5)
NEUT%: 15.5 % — AB (ref 38.4–76.8)
PLATELETS: 132 10*3/uL — AB (ref 145–400)
RBC: 4.39 10*6/uL (ref 3.70–5.45)
RDW: 14 % (ref 11.2–14.5)
WBC: 21.7 10*3/uL — ABNORMAL HIGH (ref 3.9–10.3)

## 2015-10-19 LAB — COMPREHENSIVE METABOLIC PANEL
ALT: 14 U/L (ref 0–55)
ANION GAP: 9 meq/L (ref 3–11)
AST: 17 U/L (ref 5–34)
Albumin: 3.4 g/dL — ABNORMAL LOW (ref 3.5–5.0)
Alkaline Phosphatase: 76 U/L (ref 40–150)
BUN: 14.5 mg/dL (ref 7.0–26.0)
CHLORIDE: 107 meq/L (ref 98–109)
CO2: 25 meq/L (ref 22–29)
CREATININE: 0.8 mg/dL (ref 0.6–1.1)
Calcium: 9 mg/dL (ref 8.4–10.4)
EGFR: 73 mL/min/{1.73_m2} — ABNORMAL LOW (ref >90)
Glucose: 101 mg/dl (ref 70–140)
Potassium: 3.7 mEq/L (ref 3.5–5.1)
Sodium: 141 mEq/L (ref 136–145)
Total Bilirubin: 0.41 mg/dL (ref 0.20–1.20)
Total Protein: 6 g/dL — ABNORMAL LOW (ref 6.4–8.3)

## 2015-10-19 LAB — TECHNOLOGIST REVIEW

## 2015-10-19 MED ORDER — HEPARIN SOD (PORK) LOCK FLUSH 100 UNIT/ML IV SOLN
500.0000 [IU] | INTRAVENOUS | Status: AC | PRN
  Administered 2015-10-19: 500 [IU]
  Filled 2015-10-19: qty 5

## 2015-10-19 MED ORDER — SODIUM CHLORIDE 0.9 % IJ SOLN
10.0000 mL | INTRAMUSCULAR | Status: AC | PRN
  Administered 2015-10-19: 10 mL
  Filled 2015-10-19: qty 10

## 2015-10-19 NOTE — Progress Notes (Signed)
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 She has been treated with Rituxan, ofatumumab, cladribine, bendamustine and currently with ibrutinib, with details summarized below.  INTERVAL HISTORY: Rachel [name] returns today for follow up of her chronic lymphoid leukemia. She continues on ibrutinib. She is now taking the appropriate dose, namely 3 tablets daily. She is tolerating this well. She does not feel unduly fatigued. She has some easy bruising, which is not a new problem, but no overt bleeding. She obtains the drug at a good price.  She has been on and off this drug several times. Retention goes off her warts in her hands and feet start growing. When she resumes the drug, they received.  She is planning to go back to [place] for several months and then flight to [place], and residual was 11, after which she will have an audience with the Pope in [place]: Rachel [name] is having sinus problems with occasional headaches, but no nausea or vomiting, no dizziness or gait imbalance. She sleeps poorly. She has some back pain. She feels anxious and depressed. She has moderate hot flashes. She recently completed treatment for a urinary tract infection. Those symptoms have cleared. A detailed review of systems was otherwise unremarkable.  PAST MEDICAL HISTORY: Past Medical History:  Diagnosis Date  . Cancer ([place])   . Diverticulitis   . Leukemia ([place])   . Lower back pain     PAST SURGICAL HISTORY: Past Surgical History:  Procedure Laterality Date  . BREAST SURGERY  2015   left breast bx  . BUNIONECTOMY     right foot  . lining of uterus  removed    . OTHER SURGICAL HISTORY     removal of uterine lining  . TONSILLECTOMY      FAMILY HISTORY Family History  Problem Relation Age of Onset  . Depression Son     GYNECOLOGIC HISTORY: Menarche age 65, first live birth age 54, she is [place] P3. She stopped having periods in 1999 after endometrial stripping. She continues on hormone replacement.  SOCIAL HISTORY: [name] is retired from [employer]. Her son [name] lives in [place] and has 3 sons; he works for a Pharmacist, community. A second son lives in [place] and has two children. Son [name] lives in [place] and has a daughter, as well as 2 sons by marriage.   ADVANCED DIRECTIVES: In place  HEALTH MAINTENANCE: Social History  Substance Use Topics  . Smoking status: Never Smoker  . Smokeless tobacco: Never Used  . Alcohol use 0.0 oz/week     Comment: socially     Colonoscopy: due  PAP: December 2012/ [name]  Bone density: Due  Lipid panel: per Dr Drema Dallas  Allergies  Allergen Reactions  . Penicillins Anaphylaxis    "stopped breathing" "drops my heartbeat" "I just pass out"    Current Outpatient Prescriptions  Medication Sig Dispense Refill  . acetaminophen (TYLENOL) 500 MG tablet Take 500 mg by mouth every 6 (six) hours as needed for headache.    Marland Kitchen acyclovir (ZOVIRAX) 400 MG tablet TAKE 1 TABLET TWICE A DAY 180 tablet 3  . allopurinol (ZYLOPRIM) 300 MG tablet Take 1 tablet (300  mg total) by mouth daily. 90 tablet 3  . aspirin 81 MG tablet Take 81 mg by mouth daily.    . Calcium Citrate (CITRACAL PO) Take 4 tablets by mouth daily.     . cholecalciferol (VITAMIN D) 1000 UNITS tablet Take 4,000 Units by mouth daily.    Marland Kitchen ELESTRIN 0.52 MG/0.87 GM (0.06%) GEL APPLY 2 PUMPS THINLY TO UPPER ARM DAILY AT BEDTIME 156 g 2  . ibrutinib (IMBRUVICA) 140 MG capsul Take 3 capsules (420 mg total) by mouth daily. (Patient not taking: Reported on 07/09/2015) 270 capsule 1  . lidocaine-prilocaine (EMLA) cream Apply a  nickel size amount to skin on top of port- cover with saran wrap at least 1 hour before access 30 g 0  . ondansetron (ZOFRAN ODT) 4 MG disintegrating tablet Take 1 tablet (4 mg total) by mouth every 8 (eight) hours as needed for nausea or vomiting. (Patient not taking: Reported on 03/06/2015) 10 tablet 0  . oxyCODONE-acetaminophen (PERCOCET/ROXICET) 5-325 MG tablet Take 2 tablets by mouth every 4 (four) hours as needed for severe pain. (Patient not taking: Reported on 03/06/2015) 10 tablet 0  . prochlorperazine (COMPAZINE) 10 MG tablet Take 1 tablet (10 mg total) by mouth every 6 (six) hours as needed for nausea or vomiting. (Patient not taking: Reported on 03/06/2015) 30 tablet 0  . progesterone (PROMETRIUM) 200 MG capsule TAKE 1 CAPSULE ON DAYS 1 THROUGH 12 EVERY OTHER MONTH 24 capsule 1  . traZODone (DESYREL) 50 MG tablet Take 1 tablet (50 mg total) by mouth at bedtime. 30 tablet 11   No current facility-administered medications for this visit.     OBJECTIVE: Middle-aged white woman In no acute distress Vitals:   10/19/15 0915  BP: 109/61  Pulse: 97  Resp: 18  Temp: 98 F (36.7 C)   Filed Weights   10/19/15 0915  Weight: 121 lb 4.8 oz (55 kg)   Body mass index is 24.92 kg/m.    ECOG FS: 1  Sclerae unicteric, EOMs intact Oropharynx clear and moist No cervical or supraclavicular adenopathy, no axillary or inguinal adenopathy Lungs no rales or rhonchi Heart regular rate and rhythm Abd soft, nontender, positive bowel sounds MSK no focal spinal tenderness, no upper extremity lymphedema Neuro: nonfocal, well oriented, appropriate affect Breasts: Deferred  LAB RESULTS:  Lab Results  Component Value Date   WBC 21.7 (H) 10/19/2015   NEUTROABS 3.4 10/19/2015   HGB 12.6 10/19/2015   HCT 38.8 10/19/2015   MCV 88.5 10/19/2015   PLT 132 (L) 10/19/2015      Chemistry      Component Value Date/Time   NA 138 09/20/2015 1332   K 4.1 09/20/2015 1332   CL 101 02/18/2015 2200   CL 101  12/15/2011 1322   CO2 25 09/20/2015 1332   BUN 13.3 09/20/2015 1332   CREATININE 1.0 09/20/2015 1332      Component Value Date/Time   CALCIUM 9.0 09/20/2015 1332   ALKPHOS 100 09/20/2015 1332   AST 18 09/20/2015 1332   ALT 14 09/20/2015 1332   BILITOT 0.59 09/20/2015 1332     Results for PENDA, COBLENTZ (MRN [id number]) as of 10/19/2015 18:33  Ref. Range 07/09/2015 10:23 07/17/2015 10:58 08/13/2015 08:20 09/20/2015 13:32 10/19/2015 08:48  lymph# Latest Ref Range: 0.9 - 3.3 10e3/uL 14.9 (H) 14.4 (H) 14.2 (H) 28.5 (H) 17.4 (H)   STUDIES: No results found.   ASSESSMENT: 65 y.o.  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 rigors 02/27/2014: received IV vancomycin and levaquin briefly, then levaquin po (pcn allergy)  (6) bendamustine/ rituximab for 6 cycles, completed 05/04/2014, with improvement but not normalization of the absolute lymphocyte count  (7) ibrutinib started 05/08/2014, currently at 420mg  daily  PLAN: Rachel [name] is Very excited at her upcoming audience with the Pope. She is tolerating the ibrutinib well, and has had no significant fatigue or bleeding complications at this time.  Interestingly this is taking care of her warts as well as her CLL.  We discussed the treatment of sinus issues and she is going to optimize her over-the-counter  medications and drink a lot of liquids.  Otherwise she will see me again in approximately 3 months, when she returns from her trips to [place] and [place].  Today I gave her a copy of her diagnosis and treatment history, recent scans, and all her recent labs as well as a list of her medications in case she has any problem while on her cruise. She knows to call for any other issues that may develop before her return visit.    Chauncey Cruel, MD 10/19/2015   Greatly still think you

## 2015-10-19 NOTE — Patient Instructions (Signed)

## 2015-10-19 NOTE — Telephone Encounter (Signed)
appt made and avs printed °

## 2015-11-01 ENCOUNTER — Other Ambulatory Visit: Payer: Self-pay

## 2015-11-08 ENCOUNTER — Ambulatory Visit: Payer: Medicare Other | Admitting: Oncology

## 2015-11-08 ENCOUNTER — Other Ambulatory Visit: Payer: Medicare Other

## 2015-12-05 DIAGNOSIS — N390 Urinary tract infection, site not specified: Secondary | ICD-10-CM | POA: Diagnosis not present

## 2015-12-10 ENCOUNTER — Telehealth: Payer: Self-pay | Admitting: *Deleted

## 2015-12-10 NOTE — Telephone Encounter (Signed)
FYI "I'm in [place].  I need help with ear pain to both ears.  I had right ear pain while on the plane.  After 3rd flight, both ears hurt when I open my mouth wide and my neck hurts too.  Is there an ear drop that can be ordered.  I leave October 29 th for [place]." Admits to seasonal allergies and congestion.  Advised she take the allergy medicines, nasal decongestant sprays, oral decongestant.  Try to chew gum, yawn, swallow, valsalva maneuver by holding her nose and gently blow as if blowing her nose to equalize pressure while descending in plane.  May find OTC earache drops or ear plugs.   "I need to know if there is an interaction between [place] and Ibrutinib.  I also need to know if I have a flush appointment November 30 th." [facility] Pharmacist confirmed no interaction between the two drugs and confirmed flush appointment is scheduled for lab draw on November 30 th.

## 2015-12-13 ENCOUNTER — Encounter: Payer: Self-pay | Admitting: *Deleted

## 2015-12-13 NOTE — Telephone Encounter (Signed)
FYI "I take Imbruvica and have a headache at the base of my neck.  It comes and goes.  Tylenol is not helping.  Could I have something stronger?  I also have super dry mouth.  Have a lot of congestion, cough and super runny nose.  Started Human resources officer and Mucinex two days ago.  Temp = 97.4 at this time."  Encouraged to go to an urgent care for evaluation with these symptoms.  Advised she force fluids/water, try gum, hard candies, avoid caffeine, try Biotene alcohol free mouth products.  "I'll set myself up with a provider here because I come to [place] a lot with family.  I guess I should drink water after the [place]."

## 2015-12-18 DIAGNOSIS — J309 Allergic rhinitis, unspecified: Secondary | ICD-10-CM | POA: Diagnosis not present

## 2015-12-18 DIAGNOSIS — H9209 Otalgia, unspecified ear: Secondary | ICD-10-CM | POA: Diagnosis not present

## 2015-12-18 DIAGNOSIS — Z6825 Body mass index (BMI) 25.0-25.9, adult: Secondary | ICD-10-CM | POA: Diagnosis not present

## 2015-12-18 DIAGNOSIS — C9510 Chronic leukemia of unspecified cell type not having achieved remission: Secondary | ICD-10-CM | POA: Diagnosis not present

## 2015-12-18 DIAGNOSIS — F329 Major depressive disorder, single episode, unspecified: Secondary | ICD-10-CM | POA: Diagnosis not present

## 2016-01-15 ENCOUNTER — Telehealth: Payer: Self-pay | Admitting: *Deleted

## 2016-01-15 DIAGNOSIS — C9110 Chronic lymphocytic leukemia of B-cell type not having achieved remission: Secondary | ICD-10-CM | POA: Diagnosis not present

## 2016-01-15 DIAGNOSIS — J309 Allergic rhinitis, unspecified: Secondary | ICD-10-CM | POA: Diagnosis not present

## 2016-01-15 DIAGNOSIS — H65199 Other acute nonsuppurative otitis media, unspecified ear: Secondary | ICD-10-CM | POA: Diagnosis not present

## 2016-01-15 NOTE — Telephone Encounter (Signed)
This RN returned call to pt per her message inquiring " do I need to hold the Imbruvica - they want to put me on ( stated name of antibiotic but per her accent this RN could not clarify if erthyromycin or ciproflocin ) .  Per call obtained identified VM- message left informing pt of need for additional information - but if unable to speak with this nurse - advice is to hold the Imbruvica while she is taking the antibiotic.  This RN left her name and return call number for contact.

## 2016-01-23 ENCOUNTER — Other Ambulatory Visit: Payer: Self-pay | Admitting: *Deleted

## 2016-01-23 DIAGNOSIS — C9110 Chronic lymphocytic leukemia of B-cell type not having achieved remission: Secondary | ICD-10-CM

## 2016-01-24 ENCOUNTER — Other Ambulatory Visit (HOSPITAL_BASED_OUTPATIENT_CLINIC_OR_DEPARTMENT_OTHER): Payer: Medicare Other

## 2016-01-24 ENCOUNTER — Ambulatory Visit (HOSPITAL_BASED_OUTPATIENT_CLINIC_OR_DEPARTMENT_OTHER): Payer: Medicare Other

## 2016-01-24 DIAGNOSIS — C9110 Chronic lymphocytic leukemia of B-cell type not having achieved remission: Secondary | ICD-10-CM | POA: Diagnosis not present

## 2016-01-24 DIAGNOSIS — Z95828 Presence of other vascular implants and grafts: Secondary | ICD-10-CM

## 2016-01-24 LAB — COMPREHENSIVE METABOLIC PANEL
ALT: 54 U/L (ref 0–55)
ANION GAP: 7 meq/L (ref 3–11)
AST: 50 U/L — ABNORMAL HIGH (ref 5–34)
Albumin: 3.3 g/dL — ABNORMAL LOW (ref 3.5–5.0)
Alkaline Phosphatase: 156 U/L — ABNORMAL HIGH (ref 40–150)
BUN: 9.3 mg/dL (ref 7.0–26.0)
CHLORIDE: 108 meq/L (ref 98–109)
CO2: 25 meq/L (ref 22–29)
CREATININE: 0.9 mg/dL (ref 0.6–1.1)
Calcium: 8.6 mg/dL (ref 8.4–10.4)
EGFR: 71 mL/min/{1.73_m2} — AB (ref >90)
Glucose: 103 mg/dl (ref 70–140)
Potassium: 3.7 mEq/L (ref 3.5–5.1)
SODIUM: 140 meq/L (ref 136–145)
Total Bilirubin: 0.54 mg/dL (ref 0.20–1.20)
Total Protein: 6 g/dL — ABNORMAL LOW (ref 6.4–8.3)

## 2016-01-24 LAB — MANUAL DIFFERENTIAL
ALC: 8.5 10*3/uL — ABNORMAL HIGH (ref 0.9–3.3)
ANC (CHCC manual diff): 7.1 10*3/uL — ABNORMAL HIGH (ref 1.5–6.5)
Band Neutrophils: 0 % (ref 0–10)
Basophil: 1 % (ref 0–2)
Blasts: 0 % (ref 0–0)
EOS%: 3 % (ref 0–7)
LYMPH: 48 % (ref 14–49)
MONO: 8 % (ref 0–14)
Metamyelocytes: 0 % (ref 0–0)
Myelocytes: 0 % (ref 0–0)
OTHER CELL: 0 % (ref 0–0)
PLT EST: DECREASED
PROMYELO: 0 % (ref 0–0)
SEG: 40 % (ref 38–77)
VARIANT LYMPH: 0 % (ref 0–0)
nRBC: 0 % (ref 0–0)

## 2016-01-24 LAB — CBC WITH DIFFERENTIAL/PLATELET
HCT: 36.9 % (ref 34.8–46.6)
HGB: 12.4 g/dL (ref 11.6–15.9)
MCH: 30.1 pg (ref 25.1–34.0)
MCHC: 33.6 g/dL (ref 31.5–36.0)
MCV: 89.6 fL (ref 79.5–101.0)
Platelets: 103 10*3/uL — ABNORMAL LOW (ref 145–400)
RBC: 4.12 10*6/uL (ref 3.70–5.45)
RDW: 13.1 % (ref 11.2–14.5)
WBC: 17.7 10*3/uL — AB (ref 3.9–10.3)

## 2016-01-24 MED ORDER — SODIUM CHLORIDE 0.9 % IJ SOLN
10.0000 mL | INTRAMUSCULAR | Status: AC | PRN
  Administered 2016-01-24: 10 mL
  Filled 2016-01-24: qty 10

## 2016-01-24 MED ORDER — HEPARIN SOD (PORK) LOCK FLUSH 100 UNIT/ML IV SOLN
500.0000 [IU] | INTRAVENOUS | Status: AC | PRN
  Administered 2016-01-24: 500 [IU]
  Filled 2016-01-24: qty 5

## 2016-01-25 ENCOUNTER — Other Ambulatory Visit: Payer: Self-pay | Admitting: Internal Medicine

## 2016-01-25 DIAGNOSIS — R748 Abnormal levels of other serum enzymes: Secondary | ICD-10-CM | POA: Diagnosis not present

## 2016-01-25 DIAGNOSIS — C9110 Chronic lymphocytic leukemia of B-cell type not having achieved remission: Secondary | ICD-10-CM | POA: Diagnosis not present

## 2016-01-25 DIAGNOSIS — T3695XA Adverse effect of unspecified systemic antibiotic, initial encounter: Secondary | ICD-10-CM | POA: Diagnosis not present

## 2016-01-25 DIAGNOSIS — R1031 Right lower quadrant pain: Secondary | ICD-10-CM | POA: Diagnosis not present

## 2016-01-25 DIAGNOSIS — K5730 Diverticulosis of large intestine without perforation or abscess without bleeding: Secondary | ICD-10-CM | POA: Diagnosis not present

## 2016-01-25 DIAGNOSIS — R1030 Lower abdominal pain, unspecified: Secondary | ICD-10-CM

## 2016-01-31 ENCOUNTER — Ambulatory Visit (HOSPITAL_BASED_OUTPATIENT_CLINIC_OR_DEPARTMENT_OTHER): Payer: Medicare Other | Admitting: Oncology

## 2016-01-31 VITALS — BP 104/57 | HR 67 | Temp 97.7°F | Resp 18 | Ht 58.5 in | Wt 120.6 lb

## 2016-01-31 DIAGNOSIS — R5383 Other fatigue: Secondary | ICD-10-CM | POA: Diagnosis not present

## 2016-01-31 DIAGNOSIS — C9110 Chronic lymphocytic leukemia of B-cell type not having achieved remission: Secondary | ICD-10-CM

## 2016-01-31 MED ORDER — LIDOCAINE-PRILOCAINE 2.5-2.5 % EX CREA: TOPICAL_CREAM | CUTANEOUS | 0 refills | Status: DC

## 2016-01-31 NOTE — Progress Notes (Signed)
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 She has been treated with Rituxan, ofatumumab, cladribine, bendamustine and currently with ibrutinib, with details summarized below.  INTERVAL HISTORY: Rachel [name] returns today for follow up of her chronic lymphoid leukemia. She enjoyed her cruise to [place] but she never did get an audience with the [employer] She did a lot of walking there and then in [place] with family. She says that while in the [place] her husband and she renewed their wedding vows  She stopped the imbruvica temporarily because she developed symptoms that may have been related to diverticular disease or possibly to a kidney stone. At any rate she was treated with Zithromax and that really upset her stomach. She took it between November 22 and December 1, and as soon as she stopped she went back on the [place]. In the meantime her counts slightly increased but are now back to baseline.  REVIEW OF SYSTEMS: She has many symptoms including fatigue, stomachache, pain in her hips, runny nose, sore throat, hoarseness, cough, heartburn, stress urinary incontinence, forgetfulness, anxiety, depression, and hot flashes. She has not had fevers, unexplained fatigue or unexplained weight loss.a detailed review of systems today was otherwise stable  PAST MEDICAL HISTORY: Past Medical History:  Diagnosis Date  . Cancer ([place])   . Diverticulitis   . Leukemia ([place])   . Lower back pain     PAST SURGICAL HISTORY: Past Surgical History:  Procedure Laterality Date  . BREAST SURGERY  2015   left breast bx  . BUNIONECTOMY     right foot  . lining  of uterus removed    . OTHER SURGICAL HISTORY     removal of uterine lining  . TONSILLECTOMY      FAMILY HISTORY Family History  Problem Relation Age of Onset  . Depression Son     GYNECOLOGIC HISTORY: Menarche age 28, first live birth age 21, she is [place] P3. She stopped having periods in 1999 after endometrial stripping. She continues on hormone replacement.  SOCIAL HISTORY: [name] is retired from [employer]. Her son [name] lives in [place] and has 3 sons; he works for a Pharmacist, community. A second son lives in [place] and has two children. Son [name] lives in [place] and has a daughter, as well as 2 sons by marriage.   ADVANCED DIRECTIVES: In place  HEALTH MAINTENANCE: Social History  Substance Use Topics  . Smoking status: Never Smoker  . Smokeless tobacco: Never Used  . Alcohol use 0.0 oz/week     Comment: socially     Colonoscopy: due  PAP: December 2012/ [name]  Bone density: Due  Lipid panel: per Dr Drema Dallas  Allergies  Allergen Reactions  . Penicillins Anaphylaxis    "stopped breathing" "drops my heartbeat" "I just pass out"    Current Outpatient Prescriptions  Medication Sig Dispense Refill  . acetaminophen (TYLENOL) 500 MG tablet Take 500 mg by mouth every 6 (six) hours as needed for headache.    Marland Kitchen acyclovir (ZOVIRAX) 400 MG tablet TAKE 1 TABLET TWICE A DAY 180 tablet 3  . allopurinol (ZYLOPRIM) 300 MG tablet Take 1 tablet (300 mg total) by mouth daily. 90 tablet  3  . aspirin 81 MG tablet Take 81 mg by mouth daily.    . Calcium Citrate (CITRACAL PO) Take 4 tablets by mouth daily.     . cholecalciferol (VITAMIN D) 1000 UNITS tablet Take 4,000 Units by mouth daily.    Marland Kitchen ELESTRIN 0.52 MG/0.87 GM (0.06%) GEL APPLY 2 PUMPS THINLY TO UPPER ARM DAILY AT BEDTIME 156 g 2  . ibrutinib (IMBRUVICA) 140 MG capsul Take 3 capsules (420 mg total) by mouth daily. 270 capsule 1  . lidocaine-prilocaine (EMLA) cream Apply a nickel size amount to skin on top of  port- cover with saran wrap at least 1 hour before access 30 g 0  . ondansetron (ZOFRAN ODT) 4 MG disintegrating tablet Take 1 tablet (4 mg total) by mouth every 8 (eight) hours as needed for nausea or vomiting. 10 tablet 0  . prochlorperazine (COMPAZINE) 10 MG tablet Take 1 tablet (10 mg total) by mouth every 6 (six) hours as needed for nausea or vomiting. 30 tablet 0  . progesterone (PROMETRIUM) 200 MG capsule TAKE 1 CAPSULE ON DAYS 1 THROUGH 12 EVERY OTHER MONTH 24 capsule 1  . traZODone (DESYREL) 50 MG tablet Take 1 tablet (50 mg total) by mouth at bedtime. 30 tablet 11   No current facility-administered medications for this visit.     OBJECTIVE: Middle-aged white woman who appears stated age 65:   01/31/16 0945  BP: (!) 104/57  Pulse: 67  Resp: 18  Temp: 97.7 F (36.5 C)   Filed Weights   01/31/16 0945  Weight: 120 lb 9.6 oz (54.7 kg)   Body mass index is 24.78 kg/m.    ECOG FS: 1  Sclerae unicteric, pupils round and equal Oropharynx clear and moist-- no thrush or other lesions No cervical or supraclavicular adenopathy, no axillary or inguinal adenopathy Lungs no rales or rhonchi Heart regular rate and rhythm Abd soft, nontender, positive bowel sounds MSK no focal spinal tenderness, no upper extremity lymphedema Neuro: nonfocal, well oriented, appropriate affect Breasts: Deferred    LAB RESULTS:  Lab Results  Component Value Date   WBC 17.7 (H) 01/24/2016   NEUTROABS 3.4 10/19/2015   HGB 12.4 01/24/2016   HCT 36.9 01/24/2016   MCV 89.6 01/24/2016   PLT 103 (L) 01/24/2016      Chemistry      Component Value Date/Time   NA 140 01/24/2016 0908   K 3.7 01/24/2016 0908   CL 101 02/18/2015 2200   CL 101 12/15/2011 1322   CO2 25 01/24/2016 0908   BUN 9.3 01/24/2016 0908   CREATININE 0.9 01/24/2016 0908      Component Value Date/Time   CALCIUM 8.6 01/24/2016 0908   ALKPHOS 156 (H) 01/24/2016 0908   AST 50 (H) 01/24/2016 0908   ALT 54 01/24/2016 0908    BILITOT 0.54 01/24/2016 0908     STUDIES: No results found.   ASSESSMENT: 65 y.o.  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 rigors 02/27/2014: received IV vancomycin and levaquin briefly, then levaquin po (pcn allergy)  (6) bendamustine/ rituximab for 6 cycles, completed 05/04/2014, with improvement but not normalization of the absolute lymphocyte count  (7) ibrutinib started 05/08/2014, currently at 420mg  daily  PLAN: Rachel [name] tolerated her Zithromax very poorly and it seems to have irritated her liver because her transaminases were up a week ago. She is now off that medication so hopefully that will go back to normal.  She was set up for a CT of the abdomen and pelvis next week to evaluate her lymphoma. She is tolerating the imbruvica well and her counts are well-controlled. I don't think any of the symptoms she is experiencing at present are going to be related to her chronic lymphoid leukemia.  She is going to leave for [facility] mid January and return in mid-March. I will see her again in late March. I am also going to repeat her lab work December 14 particularly to check her LFTs and she will stop by my office for a minute when I will also give her a copy of her CT scan reports  She knows to call for any other problems that may develop before her  next visit here.   Chauncey Cruel, MD 02/02/2016

## 2016-02-01 ENCOUNTER — Other Ambulatory Visit: Payer: Medicare Other

## 2016-02-05 ENCOUNTER — Other Ambulatory Visit: Payer: Medicare Other

## 2016-02-07 ENCOUNTER — Other Ambulatory Visit: Payer: Medicare Other

## 2016-02-08 ENCOUNTER — Ambulatory Visit
Admission: RE | Admit: 2016-02-08 | Discharge: 2016-02-08 | Disposition: A | Discharge status: Home / Self Care | Payer: Medicare Other | Source: Ambulatory Visit | Attending: Internal Medicine | Admitting: Internal Medicine

## 2016-02-08 ENCOUNTER — Ambulatory Visit
Admission: RE | Admit: 2016-02-08 | Discharge: 2016-02-08 | Disposition: A | Discharge status: Home / Self Care | Payer: Medicare Other | Source: Ambulatory Visit | Attending: Oncology | Admitting: Oncology

## 2016-02-08 DIAGNOSIS — C9110 Chronic lymphocytic leukemia of B-cell type not having achieved remission: Secondary | ICD-10-CM

## 2016-02-08 DIAGNOSIS — R1030 Lower abdominal pain, unspecified: Secondary | ICD-10-CM

## 2016-02-08 MED ORDER — IOPAMIDOL (ISOVUE-300) INJECTION 61%
100.0000 mL | Freq: Once | INTRAVENOUS | Status: AC | PRN
  Administered 2016-02-08: 100 mL via INTRAVENOUS

## 2016-02-11 ENCOUNTER — Other Ambulatory Visit (HOSPITAL_BASED_OUTPATIENT_CLINIC_OR_DEPARTMENT_OTHER): Payer: Medicare Other

## 2016-02-11 ENCOUNTER — Encounter: Payer: Self-pay | Admitting: *Deleted

## 2016-02-11 DIAGNOSIS — C9110 Chronic lymphocytic leukemia of B-cell type not having achieved remission: Secondary | ICD-10-CM

## 2016-02-11 LAB — COMPREHENSIVE METABOLIC PANEL
ALBUMIN: 4 g/dL (ref 3.5–5.0)
ALK PHOS: 94 U/L (ref 40–150)
ALT: 18 U/L (ref 0–55)
ANION GAP: 8 meq/L (ref 3–11)
AST: 19 U/L (ref 5–34)
BILIRUBIN TOTAL: 0.7 mg/dL (ref 0.20–1.20)
BUN: 11.3 mg/dL (ref 7.0–26.0)
CO2: 28 mEq/L (ref 22–29)
Calcium: 9.5 mg/dL (ref 8.4–10.4)
Chloride: 105 mEq/L (ref 98–109)
Creatinine: 0.9 mg/dL (ref 0.6–1.1)
EGFR: 67 mL/min/{1.73_m2} — AB (ref >90)
GLUCOSE: 89 mg/dL (ref 70–140)
POTASSIUM: 4.5 meq/L (ref 3.5–5.1)
SODIUM: 140 meq/L (ref 136–145)
Total Protein: 7.1 g/dL (ref 6.4–8.3)

## 2016-02-11 LAB — CBC WITH DIFFERENTIAL/PLATELET
BASO%: 1 % (ref 0.0–2.0)
BASOS ABS: 0.2 10*3/uL — AB (ref 0.0–0.1)
EOS ABS: 0.4 10*3/uL (ref 0.0–0.5)
EOS%: 2.8 % (ref 0.0–7.0)
HCT: 42.5 % (ref 34.8–46.6)
HEMOGLOBIN: 14 g/dL (ref 11.6–15.9)
LYMPH%: 75.1 % — AB (ref 14.0–49.7)
MCH: 29.7 pg (ref 25.1–34.0)
MCHC: 33 g/dL (ref 31.5–36.0)
MCV: 89.9 fL (ref 79.5–101.0)
MONO#: 0.5 10*3/uL (ref 0.1–0.9)
MONO%: 2.9 % (ref 0.0–14.0)
NEUT#: 2.9 10*3/uL (ref 1.5–6.5)
NEUT%: 18.2 % — ABNORMAL LOW (ref 38.4–76.8)
PLATELETS: 139 10*3/uL — AB (ref 145–400)
RBC: 4.73 10*6/uL (ref 3.70–5.45)
RDW: 13.1 % (ref 11.2–14.5)
WBC: 16.1 10*3/uL — ABNORMAL HIGH (ref 3.9–10.3)
lymph#: 12.1 10*3/uL — ABNORMAL HIGH (ref 0.9–3.3)

## 2016-02-11 LAB — LACTATE DEHYDROGENASE: LDH: 201 U/L (ref 125–245)

## 2016-02-11 LAB — TECHNOLOGIST REVIEW

## 2016-02-12 ENCOUNTER — Other Ambulatory Visit: Payer: Self-pay | Admitting: Obstetrics

## 2016-02-13 ENCOUNTER — Ambulatory Visit
Admission: RE | Admit: 2016-02-13 | Discharge: 2016-02-13 | Disposition: A | Discharge status: Home / Self Care | Payer: Medicare Other | Source: Ambulatory Visit | Attending: Internal Medicine | Admitting: Internal Medicine

## 2016-02-13 ENCOUNTER — Other Ambulatory Visit: Payer: Self-pay | Admitting: Internal Medicine

## 2016-02-13 DIAGNOSIS — R935 Abnormal findings on diagnostic imaging of other abdominal regions, including retroperitoneum: Secondary | ICD-10-CM | POA: Diagnosis not present

## 2016-02-13 DIAGNOSIS — C9110 Chronic lymphocytic leukemia of B-cell type not having achieved remission: Secondary | ICD-10-CM | POA: Diagnosis not present

## 2016-02-13 DIAGNOSIS — M7989 Other specified soft tissue disorders: Secondary | ICD-10-CM | POA: Diagnosis not present

## 2016-02-13 DIAGNOSIS — M25531 Pain in right wrist: Secondary | ICD-10-CM | POA: Diagnosis not present

## 2016-02-26 ENCOUNTER — Other Ambulatory Visit: Payer: Self-pay | Admitting: Nurse Practitioner

## 2016-02-29 ENCOUNTER — Ambulatory Visit: Payer: Medicare Other

## 2016-02-29 ENCOUNTER — Ambulatory Visit (HOSPITAL_BASED_OUTPATIENT_CLINIC_OR_DEPARTMENT_OTHER): Payer: Medicare Other

## 2016-02-29 ENCOUNTER — Telehealth: Payer: Self-pay | Admitting: *Deleted

## 2016-02-29 ENCOUNTER — Other Ambulatory Visit: Payer: Self-pay | Admitting: *Deleted

## 2016-02-29 DIAGNOSIS — Z23 Encounter for immunization: Secondary | ICD-10-CM

## 2016-02-29 DIAGNOSIS — C9110 Chronic lymphocytic leukemia of B-cell type not having achieved remission: Secondary | ICD-10-CM

## 2016-02-29 DIAGNOSIS — Z0000 Encounter for general adult medical examination without abnormal findings: Secondary | ICD-10-CM

## 2016-02-29 MED ORDER — PNEUMOCOCCAL 13-VAL CONJ VACC IM SUSP
0.5000 mL | Freq: Once | INTRAMUSCULAR | Status: DC
  Administered 2016-02-29: 0.5 mL via INTRAMUSCULAR
  Filled 2016-02-29: qty 0.5

## 2016-02-29 MED ORDER — INFLUENZA VAC SPLIT QUAD 0.5 ML IM SUSY
0.5000 mL | PREFILLED_SYRINGE | Freq: Once | INTRAMUSCULAR | Status: AC
  Administered 2016-02-29: 0.5 mL via INTRAMUSCULAR
  Filled 2016-02-29: qty 0.5

## 2016-02-29 NOTE — Telephone Encounter (Signed)
This RN contacted pt per home number with husband answering phone.  Informed him of MD recommendations per her inquiry on vaccines-  Per MD pt should obtain the flu and pneumonia but not the shingles vaccine.  Mr Sigg verbalized understanding.

## 2016-02-29 NOTE — Telephone Encounter (Signed)
Foster Operator received call from patient "requesting vaccines today as ordered by Dr. Jana Hakim.  I need these today as I am going out of town tomorrow morning."

## 2016-03-05 ENCOUNTER — Ambulatory Visit: Payer: Medicare Other | Admitting: Obstetrics

## 2016-03-10 ENCOUNTER — Telehealth: Payer: Self-pay

## 2016-03-10 NOTE — Telephone Encounter (Signed)
Pt called stating she is in [place]. She is not feeling well. She is feeling cold. She took her vitals T 96.5, 107/70, 70. She is drinking water. She is using flonase and allegra daily. Requesting advice.  Called back. She is feeling super cold, and dizzy. She has post nasal drip from a food intolerance. Ears feel OK. She is taking her imbruvica every day. She has no urinary issues. She has been in [facility] for a week. They drove down.  Her mother had a mild heart attack and was in hospital. Pt never visited her in hospital. Her mother does have bronchitis. Her husband also has URI at present.  She had her flu and pneumonia shots on 1/5.  Instructed pt to go to her MD in [facility] if she gets any worse.

## 2016-03-13 ENCOUNTER — Other Ambulatory Visit: Payer: Self-pay | Admitting: *Deleted

## 2016-03-13 ENCOUNTER — Telehealth: Payer: Self-pay | Admitting: *Deleted

## 2016-03-13 DIAGNOSIS — R05 Cough: Secondary | ICD-10-CM | POA: Diagnosis not present

## 2016-03-13 DIAGNOSIS — J069 Acute upper respiratory infection, unspecified: Secondary | ICD-10-CM | POA: Diagnosis not present

## 2016-03-13 DIAGNOSIS — R509 Fever, unspecified: Secondary | ICD-10-CM | POA: Diagnosis not present

## 2016-03-13 DIAGNOSIS — C8590 Non-Hodgkin lymphoma, unspecified, unspecified site: Secondary | ICD-10-CM | POA: Diagnosis not present

## 2016-03-13 MED ORDER — CIPROFLOXACIN HCL 500 MG PO TABS: 500.0000 mg | ORAL_TABLET | Freq: Two times a day (BID) | ORAL | 0 refills | Status: DC

## 2016-03-13 NOTE — Telephone Encounter (Signed)
Spoke with pt and was informed that pt saw her PCP this am Dr. Marianna Fuss for continuous coughing problem.  Stated had temp of 98.8 at home; at PCP office temp increased to 100.5 ; pt was tested for Flu - which was Negative ;  Strep - which was Negative.  PCP had ordered cxr.  Pt still waiting to hear back from Dr. Marianna Fuss for further instructions and what meds to take after md reviews cxr results.  Pt was instructed to call her oncologist to report increased temp of 100.5 ;  Pt wanted to know what Dr. Jana Hakim would recommend. Pt's   Phone   [phone].

## 2016-03-13 NOTE — Telephone Encounter (Signed)
This RN reviewed Triage note with MD with recommendation for pt to take Cipro.  She is to continue the [place].  Call returned to pt and above discussed. Obtained name of local pharmacy and prescription Escribed per MD order.

## 2016-03-17 ENCOUNTER — Telehealth: Payer: Self-pay

## 2016-03-17 NOTE — Telephone Encounter (Signed)
Pt called because her sinus drainage is pink. This started last night. She is using neti pot. She is not feeling as bad since she started cipro on 1/18. Discussed using saline nasal spray. She has a call in to her MD in [place] but was anxious so called [facility]. Told her to wait for MD to return her call. Explained nasal tissues being fragile, a little pink is to be watched but not of major concern. If gets darker, more frequent or frank blood to call MD right away. She also mentioned a fever blister on her lip started yesterday AM. She is taking her acyclovir. Told her to mention this to MD as well.  When she coughs the sputum is clear.

## 2016-03-21 ENCOUNTER — Telehealth: Payer: Self-pay | Admitting: *Deleted

## 2016-03-21 NOTE — Telephone Encounter (Signed)
This RN retrieved VM at 238p that was left by pt at 130pm- note pt is in [place] at this time and has a noted time difference.  Message per call " I need a prescription called in for steroids- I feel even worse "  Pt gave pharmacy as [facility] with phone number [phone].  Return call number for pt [phone].  This RN returned call and obtained VM for pt.  Message left informing pt of need to speak with her per request for appropriate recommendations.

## 2016-04-09 ENCOUNTER — Other Ambulatory Visit: Payer: Self-pay | Admitting: Obstetrics

## 2016-04-23 ENCOUNTER — Other Ambulatory Visit: Payer: Self-pay | Admitting: Oncology

## 2016-04-23 ENCOUNTER — Encounter: Payer: Self-pay | Admitting: Emergency Medicine

## 2016-04-23 DIAGNOSIS — C9110 Chronic lymphocytic leukemia of B-cell type not having achieved remission: Secondary | ICD-10-CM

## 2016-04-24 ENCOUNTER — Other Ambulatory Visit: Payer: Self-pay | Admitting: Emergency Medicine

## 2016-04-25 DIAGNOSIS — Z1231 Encounter for screening mammogram for malignant neoplasm of breast: Secondary | ICD-10-CM | POA: Diagnosis not present

## 2016-04-28 ENCOUNTER — Other Ambulatory Visit (HOSPITAL_COMMUNITY)
Admission: RE | Admit: 2016-04-28 | Discharge: 2016-04-28 | Disposition: A | Discharge status: Home / Self Care | Payer: Medicare Other | Source: Ambulatory Visit | Attending: Obstetrics | Admitting: Obstetrics

## 2016-04-28 ENCOUNTER — Ambulatory Visit (INDEPENDENT_AMBULATORY_CARE_PROVIDER_SITE_OTHER): Payer: Medicare Other | Admitting: Obstetrics

## 2016-04-28 ENCOUNTER — Encounter: Payer: Self-pay | Admitting: Obstetrics

## 2016-04-28 VITALS — BP 105/66 | HR 69 | Ht <= 58 in | Wt 121.0 lb

## 2016-04-28 DIAGNOSIS — R87810 Cervical high risk human papillomavirus (HPV) DNA test positive: Secondary | ICD-10-CM | POA: Diagnosis not present

## 2016-04-28 DIAGNOSIS — Z01419 Encounter for gynecological examination (general) (routine) without abnormal findings: Secondary | ICD-10-CM | POA: Diagnosis not present

## 2016-04-28 DIAGNOSIS — Z113 Encounter for screening for infections with a predominantly sexual mode of transmission: Secondary | ICD-10-CM

## 2016-04-28 DIAGNOSIS — Z124 Encounter for screening for malignant neoplasm of cervix: Secondary | ICD-10-CM

## 2016-04-28 DIAGNOSIS — F419 Anxiety disorder, unspecified: Secondary | ICD-10-CM

## 2016-04-28 DIAGNOSIS — Z0000 Encounter for general adult medical examination without abnormal findings: Secondary | ICD-10-CM | POA: Diagnosis not present

## 2016-04-28 DIAGNOSIS — Z01411 Encounter for gynecological examination (general) (routine) with abnormal findings: Secondary | ICD-10-CM | POA: Insufficient documentation

## 2016-04-28 DIAGNOSIS — Z1151 Encounter for screening for human papillomavirus (HPV): Secondary | ICD-10-CM | POA: Diagnosis not present

## 2016-04-28 MED ORDER — TRAZODONE HCL 50 MG PO TABS: 50.0000 mg | ORAL_TABLET | Freq: Every day | ORAL | 4 refills | Status: DC

## 2016-04-28 NOTE — Addendum Note (Signed)
Addended by: Maryruth Eve on: 04/28/2016 10:18 AM   Modules accepted: Orders

## 2016-04-28 NOTE — Progress Notes (Signed)
Pt presents for Annual and pap smear.

## 2016-04-28 NOTE — Addendum Note (Signed)
Addended by: Baltazar Najjar A on: 04/28/2016 10:16 AM   Modules accepted: Orders

## 2016-04-28 NOTE — Addendum Note (Signed)
Addended by: Maryruth Eve on: 04/28/2016 10:16 AM   Modules accepted: Orders

## 2016-04-28 NOTE — Progress Notes (Signed)
Subjective:        Rachel [name] is a 66 y.o. female here for a routine exam.  Current complaints: None.    Personal health questionnaire:  Is patient Ashkenazi Jewish, have a family history of breast and/or ovarian cancer: no Is there a family history of uterine cancer diagnosed at age < 56, gastrointestinal cancer, urinary tract cancer, family member who is a Field seismologist syndrome-associated carrier: no Is the patient overweight and hypertensive, family history of diabetes, personal history of gestational diabetes, preeclampsia or PCOS: no Is patient over 72, have PCOS,  family history of premature CHD under age 41, diabetes, smoke, have hypertension or peripheral artery disease:  no At any time, has a partner hit, kicked or otherwise hurt or frightened you?: no Over the past 2 weeks, have you felt down, depressed or hopeless?: no Over the past 2 weeks, have you felt little interest or pleasure in doing things?:no   Gynecologic History No LMP recorded. Patient is postmenopausal. Contraception: post menopausal status Last Pap: 2015. Results were: LGSIL Last mammogram: 2018. Results were: normal  Obstetric History OB History  Gravida Para Term Preterm AB Living  3         3  SAB TAB Ectopic Multiple Live Births          3    # Outcome Date GA Lbr Len/2nd Weight Sex Delivery Anes PTL Lv  3 Gravida 09/02/76    M    LIV  2 Gravida 02/12/74    M    LIV  1 Gravida 10/08/72    M   Y       Past Medical History:  Diagnosis Date  . Cancer ([place])   . Diverticulitis   . Leukemia ([place])   . Lower back pain     Past Surgical History:  Procedure Laterality Date  . BREAST SURGERY  2015   left breast bx  . BUNIONECTOMY     right foot  . lining of uterus removed    . OTHER SURGICAL HISTORY     removal of uterine lining  . TONSILLECTOMY       Current Outpatient Prescriptions:  .  acetaminophen (TYLENOL) 500 MG tablet, Take 500 mg by mouth every 6 (six) hours as needed for  headache., Disp: , Rfl:  .  acyclovir (ZOVIRAX) 400 MG tablet, TAKE 1 TABLET TWICE A DAY, Disp: 180 tablet, Rfl: 3 .  allopurinol (ZYLOPRIM) 300 MG tablet, Take 1 tablet (300 mg total) by mouth daily., Disp: 90 tablet, Rfl: 3 .  Calcium Citrate (CITRACAL PO), Take 4 tablets by mouth daily. , Disp: , Rfl:  .  cholecalciferol (VITAMIN D) 1000 UNITS tablet, Take 4,000 Units by mouth daily., Disp: , Rfl:  .  ELESTRIN 0.52 MG/0.87 GM (0.06%) GEL, APPLY 2 PUMPS THINLY TO UPPER ARM DAILY AT BEDTIME, Disp: 156 g, Rfl: 2 .  IMBRUVICA 140 MG capsul, TAKE 3 CAPSULES (420 MG TOTAL) DAILY, Disp: 270 capsule, Rfl: 1 .  lidocaine-prilocaine (EMLA) cream, Apply a nickel size amount to skin on top of port- cover with saran wrap at least 1 hour before access, Disp: 30 g, Rfl: 0 .  ondansetron (ZOFRAN ODT) 4 MG disintegrating tablet, Take 1 tablet (4 mg total) by mouth every 8 (eight) hours as needed for nausea or vomiting., Disp: 10 tablet, Rfl: 0 .  prochlorperazine (COMPAZINE) 10 MG tablet, Take 1 tablet (10 mg total) by mouth every 6 (six) hours as needed for nausea or  vomiting., Disp: 30 tablet, Rfl: 0 .  progesterone (PROMETRIUM) 200 MG capsule, TAKE 1 CAPSULE ON DAYS 1 THROUGH 12 EVERY OTHER MONTH, Disp: 24 capsule, Rfl: 1 .  traZODone (DESYREL) 50 MG tablet, Take 1 tablet (50 mg total) by mouth at bedtime., Disp: 90 tablet, Rfl: 4 .  aspirin 81 MG tablet, Take 81 mg by mouth daily., Disp: , Rfl:  .  ciprofloxacin (CIPRO) 500 MG tablet, Take 1 tablet (500 mg total) by mouth 2 (two) times daily. (Patient not taking: Reported on 04/28/2016), Disp: 10 tablet, Rfl: 0 Allergies  Allergen Reactions  . Penicillins Anaphylaxis    "stopped breathing" "drops my heartbeat" "I just pass out"    Social History  Substance Use Topics  . Smoking status: Never Smoker  . Smokeless tobacco: Never Used  . Alcohol use 0.0 oz/week     Comment: socially    Family History  Problem Relation Age of Onset  . Depression Son        Review of Systems  Constitutional: negative for fatigue and weight loss Respiratory: negative for cough and wheezing Cardiovascular: negative for chest pain, fatigue and palpitations Gastrointestinal: negative for abdominal pain and change in bowel habits Musculoskeletal:negative for myalgias Neurological: negative for gait problems and tremors Behavioral/Psych: negative for abusive relationship, depression Endocrine: negative for temperature intolerance    Genitourinary:negative for abnormal menstrual periods, genital lesions, hot flashes, sexual problems and vaginal discharge Integument/breast: negative for breast lump, breast tenderness, nipple discharge and skin lesion(s)    Objective:       BP 105/66   Pulse 69   Ht 4\' 10"  (1.473 m)   Wt 121 lb (54.9 kg)   BMI 25.29 kg/m  General:   alert  Skin:   no rash or abnormalities  Lungs:   clear to auscultation bilaterally  Heart:   regular rate and rhythm, S1, S2 normal, no murmur, click, rub or gallop  Breasts:   normal without suspicious masses, skin or nipple changes or axillary nodes  Abdomen:  normal findings: no organomegaly, soft, non-tender and no hernia  Pelvis:  External genitalia: normal general appearance Urinary system: urethral meatus normal and bladder without fullness, nontender Vaginal: normal without tenderness, induration or masses Cervix: normal appearance Adnexa: normal bimanual exam Uterus: anteverted and non-tender, normal size   Lab Review Urine pregnancy test Labs reviewed yes Radiologic studies reviewed yes  50% of 20 min visit spent on counseling and coordination of care.    Assessment:    Healthy female exam.    Plan:    Education reviewed: calcium supplements, depression evaluation, low fat, low cholesterol diet, self breast exams and weight bearing exercise. Follow up in: 1 year.   Meds ordered this encounter  Medications  . traZODone (DESYREL) 50 MG tablet    Sig: Take 1 tablet  (50 mg total) by mouth at bedtime.    Dispense:  90 tablet    Refill:  4   No orders of the defined types were placed in this encounter.

## 2016-05-02 ENCOUNTER — Encounter: Payer: Self-pay | Admitting: *Deleted

## 2016-05-07 ENCOUNTER — Ambulatory Visit (HOSPITAL_BASED_OUTPATIENT_CLINIC_OR_DEPARTMENT_OTHER): Payer: Medicare Other

## 2016-05-07 ENCOUNTER — Other Ambulatory Visit (HOSPITAL_BASED_OUTPATIENT_CLINIC_OR_DEPARTMENT_OTHER): Payer: Medicare Other

## 2016-05-07 ENCOUNTER — Ambulatory Visit (HOSPITAL_BASED_OUTPATIENT_CLINIC_OR_DEPARTMENT_OTHER): Payer: Medicare Other | Admitting: Oncology

## 2016-05-07 VITALS — BP 91/55 | HR 60 | Temp 97.6°F | Resp 17 | Ht <= 58 in | Wt 120.6 lb

## 2016-05-07 DIAGNOSIS — F3289 Other specified depressive episodes: Secondary | ICD-10-CM

## 2016-05-07 DIAGNOSIS — C9111 Chronic lymphocytic leukemia of B-cell type in remission: Secondary | ICD-10-CM

## 2016-05-07 DIAGNOSIS — C9110 Chronic lymphocytic leukemia of B-cell type not having achieved remission: Secondary | ICD-10-CM

## 2016-05-07 DIAGNOSIS — Z95828 Presence of other vascular implants and grafts: Secondary | ICD-10-CM

## 2016-05-07 LAB — CBC WITH DIFFERENTIAL/PLATELET
BASO%: 0.3 % (ref 0.0–2.0)
BASOS ABS: 0.1 10*3/uL (ref 0.0–0.1)
EOS%: 0.9 % (ref 0.0–7.0)
Eosinophils Absolute: 0.2 10*3/uL (ref 0.0–0.5)
HEMATOCRIT: 41.5 % (ref 34.8–46.6)
HEMOGLOBIN: 13.6 g/dL (ref 11.6–15.9)
LYMPH#: 19.8 10*3/uL — AB (ref 0.9–3.3)
LYMPH%: 77.9 % — ABNORMAL HIGH (ref 14.0–49.7)
MCH: 30.3 pg (ref 25.1–34.0)
MCHC: 32.8 g/dL (ref 31.5–36.0)
MCV: 92.4 fL (ref 79.5–101.0)
MONO#: 0.9 10*3/uL (ref 0.1–0.9)
MONO%: 3.5 % (ref 0.0–14.0)
NEUT%: 17.4 % — ABNORMAL LOW (ref 38.4–76.8)
NEUTROS ABS: 4.4 10*3/uL (ref 1.5–6.5)
Platelets: 133 10*3/uL — ABNORMAL LOW (ref 145–400)
RBC: 4.49 10*6/uL (ref 3.70–5.45)
RDW: 12.5 % (ref 11.2–14.5)
WBC: 25.5 10*3/uL — ABNORMAL HIGH (ref 3.9–10.3)

## 2016-05-07 LAB — CYTOLOGY - PAP
Bacterial vaginitis: NEGATIVE
CANDIDA VAGINITIS: NEGATIVE
Chlamydia: NEGATIVE
Diagnosis: UNDETERMINED — AB
HPV (WINDOPATH): DETECTED — AB
HPV 16/18/45 GENOTYPING: NEGATIVE
Neisseria Gonorrhea: NEGATIVE
Trichomonas: NEGATIVE

## 2016-05-07 LAB — COMPREHENSIVE METABOLIC PANEL
ALBUMIN: 3.7 g/dL (ref 3.5–5.0)
ALK PHOS: 82 U/L (ref 40–150)
ALT: 16 U/L (ref 0–55)
AST: 18 U/L (ref 5–34)
Anion Gap: 9 mEq/L (ref 3–11)
BILIRUBIN TOTAL: 0.56 mg/dL (ref 0.20–1.20)
BUN: 11.7 mg/dL (ref 7.0–26.0)
CALCIUM: 9 mg/dL (ref 8.4–10.4)
CO2: 25 mEq/L (ref 22–29)
CREATININE: 0.8 mg/dL (ref 0.6–1.1)
Chloride: 106 mEq/L (ref 98–109)
EGFR: 75 mL/min/{1.73_m2} — ABNORMAL LOW (ref >90)
Glucose: 81 mg/dl (ref 70–140)
Potassium: 4 mEq/L (ref 3.5–5.1)
Sodium: 141 mEq/L (ref 136–145)
TOTAL PROTEIN: 6.2 g/dL — AB (ref 6.4–8.3)

## 2016-05-07 LAB — TECHNOLOGIST REVIEW

## 2016-05-07 LAB — LACTATE DEHYDROGENASE: LDH: 261 U/L — AB (ref 125–245)

## 2016-05-07 MED ORDER — HEPARIN SOD (PORK) LOCK FLUSH 100 UNIT/ML IV SOLN
500.0000 [IU] | INTRAVENOUS | Status: DC | PRN
  Administered 2016-05-07: 500 [IU]
  Filled 2016-05-07: qty 5

## 2016-05-07 MED ORDER — SODIUM CHLORIDE 0.9 % IJ SOLN
10.0000 mL | INTRAMUSCULAR | Status: AC | PRN
  Administered 2016-05-07: 10 mL
  Filled 2016-05-07: qty 10

## 2016-05-07 MED ORDER — LIDOCAINE-PRILOCAINE 2.5-2.5 % EX CREA: TOPICAL_CREAM | CUTANEOUS | 0 refills | Status: AC

## 2016-05-07 NOTE — Progress Notes (Signed)
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 She has been treated with Rituxan, ofatumumab, cladribine, bendamustine and currently with ibrutinib, with details summarized below.  INTERVAL HISTORY: Rachel [name] returns today for follow-up of her chronic lymphoid leukemia. She continues on imbruvica. She tells me she takes 3 tablets a day both most days". She doesn't have any acute side effects from the pill but she just states taking a pill every day. She would like to do something else if there are other possibilities, which of course there are.  She obtains the drug at a very reasonable cost.   REVIEW OF SYSTEMS: She is going through a difficult time with her family right now. She is not exercising regularly. She is a bit depressed. She just had mammography which showed dense breasts and is worrisome. She has some stress urinary incontinence. She has some arthritis pains here and there which are not new or more intense than prior. There have been no drenching sweats, no fevers, no rash, no bleeding, no unexplained fatigue and no unexplained weight loss. A detailed review of systems today was otherwise stable.  PAST MEDICAL HISTORY: Past Medical History:  Diagnosis Date  . Cancer ([place])   . Diverticulitis   . Leukemia ([place])   . Lower back pain     PAST SURGICAL HISTORY: Past Surgical History:  Procedure Laterality Date  . BREAST SURGERY  2015   left breast bx  . BUNIONECTOMY     right foot  . lining of uterus removed    . OTHER SURGICAL HISTORY     removal of uterine lining  . TONSILLECTOMY      FAMILY HISTORY Family History   Problem Relation Age of Onset  . Depression Son     GYNECOLOGIC HISTORY: Menarche age 66, first live birth age 66, she is [place] P3. She stopped having periods in 1999 after endometrial stripping. She continues on hormone replacement.  SOCIAL HISTORY: [name] is retired from [employer]. Her son [name] lives in [place] and has 3 sons; he works for a Pharmacist, community. A second son lives in [place] and has two children. Son [name] lives in [place] and has a daughter, as well as 2 sons by marriage.   ADVANCED DIRECTIVES: In place  HEALTH MAINTENANCE: Social History  Substance Use Topics  . Smoking status: Never Smoker  . Smokeless tobacco: Never Used  . Alcohol use 0.0 oz/week     Comment: socially     Colonoscopy: due  PAP: December 2012/ [name]  Bone density: Due  Lipid panel: per Dr Drema Dallas  Allergies  Allergen Reactions  . Penicillins Anaphylaxis    "stopped breathing" "drops my heartbeat" "I just pass out"    Current Outpatient Prescriptions  Medication Sig Dispense Refill  . acetaminophen (TYLENOL) 500 MG tablet Take 500 mg by mouth every 6 (six) hours as needed for headache.    Marland Kitchen acyclovir (ZOVIRAX) 400 MG tablet TAKE 1 TABLET TWICE A DAY 180 tablet 3  . allopurinol (ZYLOPRIM) 300 MG tablet Take 1 tablet (300 mg total) by mouth daily. 90 tablet 3  . aspirin 81 MG tablet Take 81 mg by mouth daily.    Marland Kitchen  Calcium Citrate (CITRACAL PO) Take 4 tablets by mouth daily.     . cholecalciferol (VITAMIN D) 1000 UNITS tablet Take 4,000 Units by mouth daily.    . ciprofloxacin (CIPRO) 500 MG tablet Take 1 tablet (500 mg total) by mouth 2 (two) times daily. (Patient not taking: Reported on 04/28/2016) 10 tablet 0  . ELESTRIN 0.52 MG/0.87 GM (0.06%) GEL APPLY 2 PUMPS THINLY TO UPPER ARM DAILY AT BEDTIME 156 g 2  . IMBRUVICA 140 MG capsul TAKE 3 CAPSULES (420 MG TOTAL) DAILY 270 capsule 1  . lidocaine-prilocaine (EMLA) cream Apply a nickel size amount to skin on top of  port- cover with saran wrap at least 1 hour before access 30 g 0  . ondansetron (ZOFRAN ODT) 4 MG disintegrating tablet Take 1 tablet (4 mg total) by mouth every 8 (eight) hours as needed for nausea or vomiting. 10 tablet 0  . prochlorperazine (COMPAZINE) 10 MG tablet Take 1 tablet (10 mg total) by mouth every 6 (six) hours as needed for nausea or vomiting. 30 tablet 0  . progesterone (PROMETRIUM) 200 MG capsule TAKE 1 CAPSULE ON DAYS 1 THROUGH 12 EVERY OTHER MONTH 24 capsule 1  . traZODone (DESYREL) 50 MG tablet Take 1 tablet (50 mg total) by mouth at bedtime. 90 tablet 4   No current facility-administered medications for this visit.     OBJECTIVE: Middle-aged white womanIn no acute distress   Vitals:   05/07/16 1001  BP: (!) 91/55  Pulse: 60  Resp: 17  Temp: 97.6 F (36.4 C)   Filed Weights   05/07/16 1001  Weight: 120 lb 9.6 oz (54.7 kg)   Body mass index is 25.21 kg/m.    ECOG FS: 0  Sclerae unicteric, EOMs intact Oropharynx clear and moist No cervical or supraclavicular adenopathy, no axillary or inguinal adenopathy Lungs no rales or rhonchi Heart regular rate and rhythm Abd soft, nontender, positive bowel sounds MSK no focal spinal tenderness, no upper extremity lymphedema Neuro: nonfocal, well oriented, appropriate affect Breasts: Deferred   LAB RESULTS:  Lab Results  Component Value Date   WBC 25.5 (H) 05/07/2016   NEUTROABS 4.4 05/07/2016   HGB 13.6 05/07/2016   HCT 41.5 05/07/2016   MCV 92.4 05/07/2016   PLT 133 (L) 05/07/2016      Chemistry      Component Value Date/Time   NA 140 02/11/2016 0857   K 4.5 02/11/2016 0857   CL 101 02/18/2015 2200   CL 101 12/15/2011 1322   CO2 28 02/11/2016 0857   BUN 11.3 02/11/2016 0857   CREATININE 0.9 02/11/2016 0857      Component Value Date/Time   CALCIUM 9.5 02/11/2016 0857   ALKPHOS 94 02/11/2016 0857   AST 19 02/11/2016 0857   ALT 18 02/11/2016 0857   BILITOT 0.70 02/11/2016 0857     STUDIES: No  results found.   ASSESSMENT: 66 y.o.  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 rigors 02/27/2014: received IV vancomycin and levaquin briefly, then levaquin po (pcn allergy)  (6) bendamustine/ rituximab for 6 cycles, completed 05/04/2014, with improvement but not normalization of the absolute lymphocyte count  (7) ibrutinib started 05/08/2014, currently at 420mg  daily  PLAN: Rachel [name] continues to tolerate the imbruvica well and she is in a very nice remission. There is no palpable adenopathy, and her counts are quite good.  Her lymphocytes are a bit up because she missed some doses. She tells me she is going to get back to her 3 pills a day as before  She is going through a rough patch in her family right now. She is a little bit depressed as a result. I strongly encouraged her to get back into a regular exercise program.  She would prefer not to have to take a pill every day. She is going to be in [facility] [place] [facility] for the next several weeks. She will return to see me May 7. At that visit we will discuss alternatives for her.  We discussed the dense breast issue. When women take estrogen that does eat their breasts "younger" which means denser. If she stopped the estrogen as her breast would be more fatty replaced. She is not ready to do this at present.  She  knows to call for any other issues that may develop before her next visit.   Chauncey Cruel, MD 05/07/2016

## 2016-05-11 ENCOUNTER — Other Ambulatory Visit: Payer: Self-pay | Admitting: Oncology

## 2016-05-11 NOTE — Progress Notes (Unsigned)
Rachel [name] wanted me to surgeon for alternative treatment since she tells me she would like to get off the ibrutinib. What she really dislikes is taking a pill a day. However idelalisib and venetoclax  are both oral, and at least the latter has significant more side effects. We could go back to intravenous antibodies possibly including alemtuzumab, or try chemotherapy with fludarabine, bendamustine, or combination of the 2. Given these options I think her best choice is to continue on ibrutinib and that will be my recommendation to her.

## 2016-05-12 ENCOUNTER — Ambulatory Visit (HOSPITAL_BASED_OUTPATIENT_CLINIC_OR_DEPARTMENT_OTHER): Payer: Medicare Other

## 2016-05-12 ENCOUNTER — Ambulatory Visit (HOSPITAL_BASED_OUTPATIENT_CLINIC_OR_DEPARTMENT_OTHER): Payer: Medicare Other | Admitting: Oncology

## 2016-05-12 ENCOUNTER — Encounter: Payer: Self-pay | Admitting: Emergency Medicine

## 2016-05-12 ENCOUNTER — Other Ambulatory Visit: Payer: Self-pay | Admitting: Oncology

## 2016-05-12 ENCOUNTER — Other Ambulatory Visit (HOSPITAL_COMMUNITY)
Admission: AD | Admit: 2016-05-12 | Discharge: 2016-05-12 | Disposition: A | Discharge status: Home / Self Care | Payer: Medicare Other | Source: Ambulatory Visit | Attending: Oncology | Admitting: Oncology

## 2016-05-12 DIAGNOSIS — D696 Thrombocytopenia, unspecified: Secondary | ICD-10-CM | POA: Insufficient documentation

## 2016-05-12 DIAGNOSIS — S5010XA Contusion of unspecified forearm, initial encounter: Secondary | ICD-10-CM | POA: Diagnosis not present

## 2016-05-12 DIAGNOSIS — C9111 Chronic lymphocytic leukemia of B-cell type in remission: Secondary | ICD-10-CM | POA: Diagnosis present

## 2016-05-12 DIAGNOSIS — C9110 Chronic lymphocytic leukemia of B-cell type not having achieved remission: Secondary | ICD-10-CM | POA: Insufficient documentation

## 2016-05-12 DIAGNOSIS — T148XXA Other injury of unspecified body region, initial encounter: Secondary | ICD-10-CM

## 2016-05-12 LAB — CBC WITH DIFFERENTIAL/PLATELET
BASO%: 0.3 % (ref 0.0–2.0)
Basophils Absolute: 0 10*3/uL (ref 0.0–0.1)
EOS%: 1.2 % (ref 0.0–7.0)
Eosinophils Absolute: 0.2 10*3/uL (ref 0.0–0.5)
HCT: 42.6 % (ref 34.8–46.6)
HGB: 13.8 g/dL (ref 11.6–15.9)
LYMPH%: 71.3 % — AB (ref 14.0–49.7)
MCH: 30 pg (ref 25.1–34.0)
MCHC: 32.4 g/dL (ref 31.5–36.0)
MCV: 92.6 fL (ref 79.5–101.0)
MONO#: 0.7 10*3/uL (ref 0.1–0.9)
MONO%: 5.2 % (ref 0.0–14.0)
NEUT#: 3 10*3/uL (ref 1.5–6.5)
NEUT%: 22 % — AB (ref 38.4–76.8)
Platelets: 103 10*3/uL — ABNORMAL LOW (ref 145–400)
RBC: 4.6 10*6/uL (ref 3.70–5.45)
RDW: 12.4 % (ref 11.2–14.5)
WBC: 13.6 10*3/uL — ABNORMAL HIGH (ref 3.9–10.3)
lymph#: 9.7 10*3/uL — ABNORMAL HIGH (ref 0.9–3.3)

## 2016-05-12 LAB — PROTIME-INR
INR: 1 — AB (ref 2.00–3.50)
PROTIME: 12 s (ref 10.6–13.4)

## 2016-05-12 LAB — COMPREHENSIVE METABOLIC PANEL
ALT: 27 U/L (ref 0–55)
ANION GAP: 10 meq/L (ref 3–11)
AST: 25 U/L (ref 5–34)
Albumin: 4.1 g/dL (ref 3.5–5.0)
Alkaline Phosphatase: 82 U/L (ref 40–150)
BUN: 10.5 mg/dL (ref 7.0–26.0)
CHLORIDE: 105 meq/L (ref 98–109)
CO2: 27 meq/L (ref 22–29)
Calcium: 9.6 mg/dL (ref 8.4–10.4)
Creatinine: 0.8 mg/dL (ref 0.6–1.1)
EGFR: 73 mL/min/{1.73_m2} — ABNORMAL LOW (ref >90)
Glucose: 85 mg/dl (ref 70–140)
Potassium: 4.4 mEq/L (ref 3.5–5.1)
Sodium: 142 mEq/L (ref 136–145)
Total Bilirubin: 0.63 mg/dL (ref 0.20–1.20)
Total Protein: 6.7 g/dL (ref 6.4–8.3)

## 2016-05-12 LAB — TECHNOLOGIST REVIEW

## 2016-05-12 LAB — LACTATE DEHYDROGENASE: LDH: 207 U/L (ref 125–245)

## 2016-05-12 LAB — APTT: APTT: 25 s (ref 24–36)

## 2016-05-12 NOTE — Progress Notes (Signed)
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 She has been treated with Rituxan, ofatumumab, cladribine, bendamustine and currently with ibrutinib, with details summarized below.  INTERVAL HISTORY: Rachel [name] returns today for an unscheduled visit. She is about to leave for [facility] where she will be for several weeks. This morning though she noted some bruising in her right lower arm, on the volar aspect. She also has a bruise on the inner right thigh. She denies any trauma regarding either of these sites. She came in for further evaluation   REVIEW OF SYSTEMS: A detailed review of systems today was entirely negative and in particular she denies any abuse.  PAST MEDICAL HISTORY: Past Medical History:  Diagnosis Date  . Cancer ([place])   . Diverticulitis   . Leukemia ([place])   . Lower back pain     PAST SURGICAL HISTORY: Past Surgical History:  Procedure Laterality Date  . BREAST SURGERY  2015   left breast bx  . BUNIONECTOMY     right foot  . lining of uterus removed    . OTHER SURGICAL HISTORY     removal of uterine lining  . TONSILLECTOMY      FAMILY HISTORY Family History  Problem Relation Age of Onset  . Depression Son     GYNECOLOGIC HISTORY: Menarche age 1, first live birth age 1, she is [place] P3. She stopped having periods in 1999 after endometrial stripping. She continues on hormone replacement.  SOCIAL HISTORY: Her husband Rachel [name] is retired from [employer]. Her son Rachel [name] lives in [place] and has 3 sons; he works for a Pharmacist, community. A second son lives in [place] and has two children. Son Rachel [name] lives in  [place] and has a daughter, as well as 2 sons by marriage.   ADVANCED DIRECTIVES: In place  HEALTH MAINTENANCE: Social History  Substance Use Topics  . Smoking status: Never Smoker  . Smokeless tobacco: Never Used  . Alcohol use 0.0 oz/week     Comment: socially     Colonoscopy: due  PAP: December 2012/ [name]  Bone density: Due  Lipid panel: per Dr Drema Dallas  Allergies  Allergen Reactions  . Penicillins Anaphylaxis    "stopped breathing" "drops my heartbeat" "I just pass out"    Current Outpatient Prescriptions  Medication Sig Dispense Refill  . acetaminophen (TYLENOL) 500 MG tablet Take 500 mg by mouth every 6 (six) hours as needed for headache.    Marland Kitchen acyclovir (ZOVIRAX) 400 MG tablet TAKE 1 TABLET TWICE A DAY 180 tablet 3  . allopurinol (ZYLOPRIM) 300 MG tablet Take 1 tablet (300 mg total) by mouth daily. 90 tablet 3  . aspirin 81 MG tablet Take 81 mg by mouth daily.    . Calcium Citrate (CITRACAL PO) Take 4 tablets by mouth daily.     . cholecalciferol (VITAMIN D) 1000 UNITS tablet Take 4,000 Units by mouth daily.    Marland Kitchen ELESTRIN 0.52 MG/0.87 GM (0.06%) GEL APPLY 2 PUMPS THINLY TO UPPER ARM DAILY AT BEDTIME 156 g 2  . IMBRUVICA 140 MG capsul TAKE 3 CAPSULES (420 MG TOTAL) DAILY 270 capsule 1  . lidocaine-prilocaine (EMLA) cream Apply a nickel size amount to  skin on top of port- cover with saran wrap at least 1 hour before access 30 g 0  . progesterone (PROMETRIUM) 200 MG capsule TAKE 1 CAPSULE ON DAYS 1 THROUGH 12 EVERY OTHER MONTH 24 capsule 1  . traZODone (DESYREL) 50 MG tablet Take 1 tablet (50 mg total) by mouth at bedtime. 90 tablet 4   No current facility-administered medications for this visit.     OBJECTIVE: Middle-aged white woman who appears stated age  There were no vitals filed for this visit. There were no vitals filed for this visit. There is no height or weight on file to calculate BMI.    ECOG FS: 0  Examination of the skin of the upper body shows no  other sites of bruising. The right forearm is imaged below. The lesions are barely palpable, nonscaly nontender.  Interestingly  in the left forearm there are 2 similar spots, which are flat and no longer bluish, more light brown. There is in addition a bruise on the inner right thigh measuring approximately 3 x 1.5 cm, not palpable and nontender   Photo of the right forearm 05/12/2016    LAB RESULTS:  Lab Results  Component Value Date   WBC 25.5 (H) 05/07/2016   NEUTROABS 4.4 05/07/2016   HGB 13.6 05/07/2016   HCT 41.5 05/07/2016   MCV 92.4 05/07/2016   PLT 133 (L) 05/07/2016      Chemistry      Component Value Date/Time   NA 141 05/07/2016 0928   K 4.0 05/07/2016 0928   CL 101 02/18/2015 2200   CL 101 12/15/2011 1322   CO2 25 05/07/2016 0928   BUN 11.7 05/07/2016 0928   CREATININE 0.8 05/07/2016 0928      Component Value Date/Time   CALCIUM 9.0 05/07/2016 0928   ALKPHOS 82 05/07/2016 0928   AST 18 05/07/2016 0928   ALT 16 05/07/2016 0928   BILITOT 0.56 05/07/2016 0928     STUDIES: No results found.   ASSESSMENT: 66 y.o.  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 rigors 02/27/2014: received IV vancomycin and levaquin briefly, then levaquin po (pcn allergy)  (6) bendamustine/ rituximab for 6 cycles, completed 05/04/2014, with  improvement but not normalization of the absolute lymphocyte count  (7) ibrutinib started 05/08/2014, currently at 420mg  daily  PLAN: Rachel [name] is known to cause bruising. She tells me she's been off the medication for the last 2 days ("I just forgot")--I suggested she stay off another 5 days and then resume. I am checking a CBC and call asked today just to make sure but I expect them to be unremarkable as far as the bruising is concerned. I am pasting below a report studying the pathophysiology of ibrutinib associated bleeding and bruising. Basically it induces a platelet aggregation defect.  At the last visit she was interested in coming off this medication. As noted in a separate note, most of the other new were  agents are also oral and what she really one sister, all for daily.  Accordingly when she returns to see me we will consider either chemotherapy, or antibodies, or a combination  She will let me know if the bruising persists. Otherwise her next visit here is scheduled for early May.    Chauncey Cruel, MD 05/12/2016   Leukemia. 2015 Apr;29(4):783-7. doi: [email or web address]/leu.2014.247. Epub 2014 Aug 20.  Ibrutinib inhibits collagen-mediated but not ADP-mediated platelet aggregation. Kamel S1, Horton L1, Ysebaert L2, Levade M3, Burbury K4, Tan S1, Cole-Sinclair M1, Reynolds J5, Filshie R1, Schischka S1, Khot A4, Sandhu S4, Keating MJ6, Nandurkar H7, Tam CS8.  Author information Abstract The BTK (Bruton's tyrosine kinase) inhibitor ibrutinib is associated with an increased risk of bleeding. A previous study reported defects in collagen- and adenosine diphosphate (ADP)-dependent platelet responses when ibrutinib was added ex vivo to patient samples. Whereas the collagen defect is expected given the central role of BTK in glycoprotein VI signaling, the ADP defect lacks a mechanistic explanation. In order to determine the real-life consequences of BTK platelet blockade, we performed light  transmission aggregometry in 23 patients receiving ibrutinib treatment. All patients had reductions in collagen-mediated platelet aggregation, with a significant association between the degree of inhibition and the occurrence of clinical bleeding or bruising (P=0.044). This collagen defect was reversible on drug cessation. In contrast to the previous ex vivo report, we found no in vivo ADP defects in subjects receiving standard doses of ibrutinib. These results establish platelet light transmission aggregometry as a method for gauging, at least qualitatively, the severity of platelet impairment in patients receiving ibrutinib treatment. PMID: [id number] DOI: [email or web address]/[email or web address] [Indexed for MEDLINE]

## 2016-05-16 DIAGNOSIS — N390 Urinary tract infection, site not specified: Secondary | ICD-10-CM | POA: Diagnosis not present

## 2016-05-16 DIAGNOSIS — R509 Fever, unspecified: Secondary | ICD-10-CM | POA: Diagnosis not present

## 2016-05-16 DIAGNOSIS — C9510 Chronic leukemia of unspecified cell type not having achieved remission: Secondary | ICD-10-CM | POA: Diagnosis not present

## 2016-05-16 DIAGNOSIS — R195 Other fecal abnormalities: Secondary | ICD-10-CM | POA: Diagnosis not present

## 2016-06-30 ENCOUNTER — Ambulatory Visit: Payer: Medicare Other

## 2016-06-30 ENCOUNTER — Ambulatory Visit (HOSPITAL_BASED_OUTPATIENT_CLINIC_OR_DEPARTMENT_OTHER): Payer: Medicare Other | Admitting: Oncology

## 2016-06-30 ENCOUNTER — Other Ambulatory Visit (HOSPITAL_BASED_OUTPATIENT_CLINIC_OR_DEPARTMENT_OTHER): Payer: Medicare Other

## 2016-06-30 VITALS — BP 107/52 | HR 74 | Temp 98.1°F | Resp 18 | Ht <= 58 in | Wt 121.4 lb

## 2016-06-30 DIAGNOSIS — C9110 Chronic lymphocytic leukemia of B-cell type not having achieved remission: Secondary | ICD-10-CM | POA: Diagnosis not present

## 2016-06-30 DIAGNOSIS — Z95828 Presence of other vascular implants and grafts: Secondary | ICD-10-CM

## 2016-06-30 DIAGNOSIS — C9190 Lymphoid leukemia, unspecified not having achieved remission: Secondary | ICD-10-CM | POA: Diagnosis not present

## 2016-06-30 LAB — COMPREHENSIVE METABOLIC PANEL
ALT: 12 U/L (ref 0–55)
AST: 17 U/L (ref 5–34)
Albumin: 3.9 g/dL (ref 3.5–5.0)
Alkaline Phosphatase: 84 U/L (ref 40–150)
Anion Gap: 8 mEq/L (ref 3–11)
BUN: 19.1 mg/dL (ref 7.0–26.0)
CHLORIDE: 107 meq/L (ref 98–109)
CO2: 28 meq/L (ref 22–29)
Calcium: 9.3 mg/dL (ref 8.4–10.4)
Creatinine: 0.9 mg/dL (ref 0.6–1.1)
EGFR: 65 mL/min/{1.73_m2} — ABNORMAL LOW (ref >90)
Glucose: 126 mg/dl (ref 70–140)
POTASSIUM: 4.4 meq/L (ref 3.5–5.1)
SODIUM: 142 meq/L (ref 136–145)
Total Bilirubin: 0.49 mg/dL (ref 0.20–1.20)
Total Protein: 6.2 g/dL — ABNORMAL LOW (ref 6.4–8.3)

## 2016-06-30 LAB — LACTATE DEHYDROGENASE: LDH: 238 U/L (ref 125–245)

## 2016-06-30 LAB — MANUAL DIFFERENTIAL
ALC: 16.4 10*3/uL — ABNORMAL HIGH (ref 0.9–3.3)
ANC (CHCC MAN DIFF): 4.9 10*3/uL (ref 1.5–6.5)
EOS: 4 % (ref 0–7)
LYMPH: 70 % — AB (ref 14–49)
MONO: 5 % (ref 0–14)
PLT EST: ADEQUATE
RBC COMMENTS: NORMAL
SEG: 21 % — AB (ref 38–77)

## 2016-06-30 LAB — CBC WITH DIFFERENTIAL/PLATELET
HCT: 39.4 % (ref 34.8–46.6)
HGB: 12.9 g/dL (ref 11.6–15.9)
MCH: 30.3 pg (ref 25.1–34.0)
MCHC: 32.8 g/dL (ref 31.5–36.0)
MCV: 92.4 fL (ref 79.5–101.0)
Platelets: 165 10*3/uL (ref 145–400)
RBC: 4.27 10*6/uL (ref 3.70–5.45)
RDW: 13.3 % (ref 11.2–14.5)
WBC: 23.4 10*3/uL — AB (ref 3.9–10.3)

## 2016-06-30 MED ORDER — SODIUM CHLORIDE 0.9% FLUSH
10.0000 mL | INTRAVENOUS | Status: DC | PRN
  Filled 2016-06-30: qty 10

## 2016-06-30 MED ORDER — HEPARIN SOD (PORK) LOCK FLUSH 100 UNIT/ML IV SOLN
500.0000 [IU] | Freq: Once | INTRAVENOUS | Status: DC
  Filled 2016-06-30: qty 5

## 2016-06-30 NOTE — Progress Notes (Signed)
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 She has been treated with Rituxan, ofatumumab, cladribine, bendamustine and currently with ibrutinib, with details summarized below.  INTERVAL HISTORY: Rachel [name] returns today for follow-up of her chronic lymphoid leukemia. She continues on imbruvica, which she generally tolerates well. She has not had any significant bleeding and only mild bruising.  However she very much wants to stop this medication. She is just "too many pills". She does not like taking pills every day. She would like to consider some alternatives.  REVIEW OF SYSTEMS: She and her husband appear to have reached a satisfactory understanding to both of them. Rachel [name] is still using alcohol as a relaxant and while it is small amount is okay larger amounts may be a problem. I suggested she stop for about a month to get recess at times and then she can drink smaller amounts.e has a good understanding of what 11 drink" means. She is also very concerned about her 43 year old mother in [place] who is clearly declining and is currently under hospice. Other problems include insomnia, occasional blurred vision, dry cough, forgetfulness, anxiety, depression, and hot flashes. Rachel [name] however is continuing to exercise regularly. She denies fever, unexplained weight loss, or unexplained fatigue. A detailed review of systems today was otherwise stable.   PAST MEDICAL HISTORY: Past Medical History:  Diagnosis Date  . Cancer ([place])   . Diverticulitis   . Leukemia ([place])   . Lower back pain     PAST SURGICAL HISTORY: Past Surgical History:   Procedure Laterality Date  . BREAST SURGERY  2015   left breast bx  . BUNIONECTOMY     right foot  . lining of uterus removed    . OTHER SURGICAL HISTORY     removal of uterine lining  . TONSILLECTOMY      FAMILY HISTORY Family History  Problem Relation Age of Onset  . Depression Son     GYNECOLOGIC HISTORY: Menarche age 66, first live birth age 75, she is [place] P3. She stopped having periods in 1999 after endometrial stripping. She continues on hormone replacement.  SOCIAL HISTORY: [name] is retired from [employer]. Her son [name] lives in [place] and has 3 sons; he works for a Pharmacist, community. A second son lives in [place] and has two children. Son [name] lives in [place] and has a daughter, as well as 2 sons by marriage.   ADVANCED DIRECTIVES: In place  HEALTH MAINTENANCE: Social History  Substance Use Topics  . Smoking status: Never Smoker  . Smokeless tobacco: Never Used  . Alcohol use 0.0 oz/week     Comment: socially     Colonoscopy: due  PAP: December 2012/ [name]  Bone density: Due  Lipid panel: per Dr Drema Dallas  Allergies  Allergen Reactions  . Penicillins Anaphylaxis    "stopped breathing" "drops my heartbeat" "I just pass out"    Current Outpatient Prescriptions  Medication Sig Dispense Refill  . acetaminophen (TYLENOL) 500 MG tablet Take 500 mg by mouth every 6 (six) hours as needed for headache.    Marland Kitchen acyclovir (ZOVIRAX) 400 MG tablet TAKE 1 TABLET TWICE A DAY 180 tablet 3  . allopurinol (  ZYLOPRIM) 300 MG tablet Take 1 tablet (300 mg total) by mouth daily. 90 tablet 3  . aspirin 81 MG tablet Take 81 mg by mouth daily.    . Calcium Citrate (CITRACAL PO) Take 4 tablets by mouth daily.     . cholecalciferol (VITAMIN D) 1000 UNITS tablet Take 4,000 Units by mouth daily.    Marland Kitchen ELESTRIN 0.52 MG/0.87 GM (0.06%) GEL APPLY 2 PUMPS THINLY TO UPPER ARM DAILY AT BEDTIME 156 g 2  . IMBRUVICA 140 MG capsul TAKE 3 CAPSULES (420 MG TOTAL) DAILY 270  capsule 1  . lidocaine-prilocaine (EMLA) cream Apply a nickel size amount to skin on top of port- cover with saran wrap at least 1 hour before access 30 g 0  . progesterone (PROMETRIUM) 200 MG capsule TAKE 1 CAPSULE ON DAYS 1 THROUGH 12 EVERY OTHER MONTH 24 capsule 1  . traZODone (DESYREL) 50 MG tablet Take 1 tablet (50 mg total) by mouth at bedtime. 90 tablet 4   No current facility-administered medications for this visit.     OBJECTIVE: Middle-aged white woman In no acute distress  Vitals:   06/30/16 1459  BP: (!) 107/52  Pulse: 74  Resp: 18  Temp: 98.1 F (36.7 C)   Filed Weights   06/30/16 1459  Weight: 121 lb 6.4 oz (55.1 kg)   Body mass index is 25.37 kg/m.    ECOG FS: 0  Sclerae unicteric, pupils round and equal Oropharynx clear and moist No cervical or supraclavicular adenopathy Lungs no rales or rhonchi Heart regular rate and rhythm Abd soft, nontender, positive bowel sounds MSK no focal spinal tenderness, no upper extremity lymphedema Neuro: nonfocal, well oriented, appropriate affect Breasts: Deferred  LAB RESULTS:  Lab Results  Component Value Date   WBC 23.4 (H) 06/30/2016   NEUTROABS 3.0 05/12/2016   HGB 12.9 06/30/2016   HCT 39.4 06/30/2016   MCV 92.4 06/30/2016   PLT 165 06/30/2016      Chemistry      Component Value Date/Time   NA 142 06/30/2016 1352   K 4.4 06/30/2016 1352   CL 101 02/18/2015 2200   CL 101 12/15/2011 1322   CO2 28 06/30/2016 1352   BUN 19.1 06/30/2016 1352   CREATININE 0.9 06/30/2016 1352      Component Value Date/Time   CALCIUM 9.3 06/30/2016 1352   ALKPHOS 84 06/30/2016 1352   AST 17 06/30/2016 1352   ALT 12 06/30/2016 1352   BILITOT 0.49 06/30/2016 1352     STUDIES: No results found.   ASSESSMENT: 66 y.o.  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 rigors 02/27/2014: received IV vancomycin and levaquin briefly, then levaquin po (pcn allergy)  (6) bendamustine/ rituximab for 6 cycles, completed 05/04/2014, with improvement but not normalization of the absolute lymphocyte count  (7) ibrutinib started 05/08/2014, currently at 420mg  daily  PLAN: I spent approximately 30 minutes with Eriana with most of that time  spent discussing her Multiple treatment options. Since she wants to stop the imbruvica we particularly discussed going back to chemotherapy plus antibodies. There are several options there including some she has had in the past which might work again.  After much discussion however which she really would like to do is simply stop the imbruvica and "see what happens". She would like to have her counts followed closely and if there is an indication for treatment then at that point she would consider resuming treatment, either with imbruvica or some alternative.  So long as she is in town and we can follow her closely that is acceptable in this chronic incurable disease. I am concerned that she does travel quite a bit and spends long periods of time in [place], which might complicate matters.  At this point then the plan will be to stop the imbruvica and initiate close follow-up. We will check her lab work on a monthly basis. We had baseline labs today including a beta-2 microglobulin and baseline immunoglobulins. She will see me again in 3 months.  She knows  to call for any problems that may develop before her next visit.   Chauncey Cruel, MD 06/30/2016   Leukemia. 2015 Apr;29(4):783-7. doi: 10.1038/leu.2014.247. Epub 2014 Aug 20.  Ibrutinib inhibits collagen-mediated but not ADP-mediated platelet aggregation. Kamel S1, Horton L1, Ysebaert L2, Levade M3, Burbury K4, Tan S1, Cole-Sinclair M1, Reynolds J5, Filshie R1, Schischka S1, Khot A4, Sandhu S4, Keating MJ6, Nandurkar H7, Tam CS8.  Author information Abstract The BTK (Bruton's tyrosine kinase) inhibitor ibrutinib is associated with an increased risk of bleeding. A previous study reported defects in collagen- and adenosine diphosphate (ADP)-dependent platelet responses when ibrutinib was added ex vivo to patient samples. Whereas the collagen defect is expected given the central role of BTK in glycoprotein VI signaling, the ADP defect lacks a mechanistic explanation. In order to determine the real-life consequences of BTK platelet blockade, we performed light transmission aggregometry in 23 patients receiving ibrutinib treatment. All patients had reductions in collagen-mediated platelet aggregation, with a significant association between the degree of inhibition and the occurrence of clinical bleeding or bruising (P=0.044). This collagen defect was reversible on drug cessation. In contrast to the previous ex vivo report, we found no in vivo ADP defects in subjects receiving standard doses of ibrutinib. These results establish platelet light transmission aggregometry as a method for gauging, at least qualitatively, the severity of platelet impairment in patients receiving ibrutinib treatment. PMID: 15176160 DOI: [email or web address]/leu.2014.247 [Indexed for MEDLINE]

## 2016-06-30 NOTE — Patient Instructions (Signed)

## 2016-07-01 LAB — IGG, IGA, IGM
IGM (IMMUNOGLOBIN M), SRM: 28 mg/dL (ref 26–217)
IgA, Qn, Serum: 81 mg/dL — ABNORMAL LOW (ref 87–352)
IgG, Qn, Serum: 598 mg/dL — ABNORMAL LOW (ref 700–1600)

## 2016-07-01 LAB — BETA 2 MICROGLOBULIN, SERUM: BETA 2: 3.2 mg/L — AB (ref 0.6–2.4)

## 2016-07-15 ENCOUNTER — Telehealth: Payer: Self-pay | Admitting: *Deleted

## 2016-07-15 NOTE — Telephone Encounter (Signed)
Message left on VM from patient stating " I have decided to stay on the Imbruvica - so I do not need monthly labs until August ".  LOS sent to scheduling per above.

## 2016-07-17 ENCOUNTER — Other Ambulatory Visit: Payer: Self-pay | Admitting: Oncology

## 2016-07-17 ENCOUNTER — Other Ambulatory Visit: Payer: Self-pay | Admitting: Obstetrics

## 2016-07-29 ENCOUNTER — Other Ambulatory Visit: Payer: Medicare Other

## 2016-08-26 ENCOUNTER — Other Ambulatory Visit: Payer: Medicare Other

## 2016-09-12 ENCOUNTER — Ambulatory Visit (HOSPITAL_BASED_OUTPATIENT_CLINIC_OR_DEPARTMENT_OTHER): Payer: Medicare Other | Admitting: Adult Health

## 2016-09-12 ENCOUNTER — Other Ambulatory Visit (HOSPITAL_BASED_OUTPATIENT_CLINIC_OR_DEPARTMENT_OTHER): Payer: Medicare Other

## 2016-09-12 VITALS — BP 123/40 | HR 60 | Temp 97.8°F | Resp 20 | Ht <= 58 in | Wt 122.9 lb

## 2016-09-12 DIAGNOSIS — C9110 Chronic lymphocytic leukemia of B-cell type not having achieved remission: Secondary | ICD-10-CM | POA: Diagnosis not present

## 2016-09-12 DIAGNOSIS — C9190 Lymphoid leukemia, unspecified not having achieved remission: Secondary | ICD-10-CM | POA: Diagnosis not present

## 2016-09-12 DIAGNOSIS — R55 Syncope and collapse: Secondary | ICD-10-CM

## 2016-09-12 LAB — COMPREHENSIVE METABOLIC PANEL
ALBUMIN: 4.1 g/dL (ref 3.5–5.0)
ALK PHOS: 87 U/L (ref 40–150)
ALT: 14 U/L (ref 0–55)
ANION GAP: 8 meq/L (ref 3–11)
AST: 20 U/L (ref 5–34)
BUN: 13.5 mg/dL (ref 7.0–26.0)
CALCIUM: 9.3 mg/dL (ref 8.4–10.4)
CHLORIDE: 103 meq/L (ref 98–109)
CO2: 27 mEq/L (ref 22–29)
Creatinine: 0.9 mg/dL (ref 0.6–1.1)
EGFR: 70 mL/min/{1.73_m2} — ABNORMAL LOW (ref >90)
Glucose: 77 mg/dl (ref 70–140)
POTASSIUM: 4.2 meq/L (ref 3.5–5.1)
Sodium: 139 mEq/L (ref 136–145)
Total Bilirubin: 0.68 mg/dL (ref 0.20–1.20)
Total Protein: 6.6 g/dL (ref 6.4–8.3)

## 2016-09-12 LAB — CBC WITH DIFFERENTIAL/PLATELET
BASO%: 0.3 % (ref 0.0–2.0)
BASOS ABS: 0 10*3/uL (ref 0.0–0.1)
EOS%: 1.1 % (ref 0.0–7.0)
Eosinophils Absolute: 0.2 10*3/uL (ref 0.0–0.5)
HEMATOCRIT: 42.5 % (ref 34.8–46.6)
HEMOGLOBIN: 13.9 g/dL (ref 11.6–15.9)
LYMPH#: 10.4 10*3/uL — AB (ref 0.9–3.3)
LYMPH%: 68 % — ABNORMAL HIGH (ref 14.0–49.7)
MCH: 30.2 pg (ref 25.1–34.0)
MCHC: 32.7 g/dL (ref 31.5–36.0)
MCV: 92.2 fL (ref 79.5–101.0)
MONO#: 0.5 10*3/uL (ref 0.1–0.9)
MONO%: 3.3 % (ref 0.0–14.0)
NEUT#: 4.2 10*3/uL (ref 1.5–6.5)
NEUT%: 27.3 % — AB (ref 38.4–76.8)
PLATELETS: 103 10*3/uL — AB (ref 145–400)
RBC: 4.61 10*6/uL (ref 3.70–5.45)
RDW: 12.2 % (ref 11.2–14.5)
WBC: 15.4 10*3/uL — ABNORMAL HIGH (ref 3.9–10.3)

## 2016-09-12 LAB — LACTATE DEHYDROGENASE: LDH: 217 U/L (ref 125–245)

## 2016-09-12 LAB — TECHNOLOGIST REVIEW

## 2016-09-13 ENCOUNTER — Encounter: Payer: Self-pay | Admitting: Adult Health
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
## 2016-09-16 ENCOUNTER — Telehealth: Payer: Self-pay | Admitting: *Deleted

## 2016-09-16 ENCOUNTER — Other Ambulatory Visit: Payer: Self-pay | Admitting: *Deleted

## 2016-09-16 LAB — BETA 2 MICROGLOBULIN, SERUM: Beta-2: 3.4 mg/L — ABNORMAL HIGH (ref 0.6–2.4)

## 2016-09-16 NOTE — Telephone Encounter (Signed)
Pt left VM stating " I need to let Dr Magrinat's nurse know I do not want to proceed to have an MRI - please cancel it "  Noted appointment has not been made due to authorization still pending.  This note will be forwarded to MD and NP ( who ordered ) for communcation.  Order canceled by this RN per pt's request.

## 2016-09-19 ENCOUNTER — Other Ambulatory Visit: Payer: Self-pay | Admitting: Obstetrics

## 2016-09-24 ENCOUNTER — Other Ambulatory Visit: Payer: Self-pay | Admitting: Obstetrics

## 2016-09-24 ENCOUNTER — Ambulatory Visit (INDEPENDENT_AMBULATORY_CARE_PROVIDER_SITE_OTHER): Payer: Medicare Other | Admitting: Obstetrics

## 2016-09-24 ENCOUNTER — Other Ambulatory Visit (HOSPITAL_COMMUNITY)
Admission: RE | Admit: 2016-09-24 | Discharge: 2016-09-24 | Disposition: A | Discharge status: Home / Self Care | Payer: Medicare Other | Source: Ambulatory Visit | Attending: Obstetrics | Admitting: Obstetrics

## 2016-09-24 ENCOUNTER — Encounter: Payer: Self-pay | Admitting: Obstetrics

## 2016-09-24 ENCOUNTER — Telehealth: Payer: Self-pay

## 2016-09-24 VITALS — BP 101/66 | HR 76 | Wt 123.2 lb

## 2016-09-24 DIAGNOSIS — R309 Painful micturition, unspecified: Secondary | ICD-10-CM | POA: Diagnosis not present

## 2016-09-24 DIAGNOSIS — N898 Other specified noninflammatory disorders of vagina: Secondary | ICD-10-CM

## 2016-09-24 DIAGNOSIS — J3089 Other allergic rhinitis: Secondary | ICD-10-CM | POA: Diagnosis not present

## 2016-09-24 NOTE — Telephone Encounter (Signed)
Pt called stating that she is having some vaginal pain, and cramping. Also complains of having vaginal odor with discharge. She is also having urinary frequency. Appt scheduled for today with Dr. Jodi Mourning

## 2016-09-24 NOTE — Progress Notes (Signed)
Patient ID: Rachel [name], female   DOB: 1950/04/18, 66 y.o.   MRN: [id number]  Chief Complaint  Patient presents with  . Gynecologic Exam    HPI Rachel [name] is a 66 y.o. female.  Vaginal discharge with odor and irritation. HPI  Past Medical History:  Diagnosis Date  . Cancer ([place])   . Diverticulitis   . Leukemia ([place])   . Lower back pain     Past Surgical History:  Procedure Laterality Date  . BREAST SURGERY  2015   left breast bx  . BUNIONECTOMY     right foot  . lining of uterus removed    . OTHER SURGICAL HISTORY     removal of uterine lining  . TONSILLECTOMY      Family History  Problem Relation Age of Onset  . Depression Son     Social History Social History  Substance Use Topics  . Smoking status: Never Smoker  . Smokeless tobacco: Never Used  . Alcohol use 0.0 oz/week     Comment: socially    Allergies  Allergen Reactions  . Penicillins Anaphylaxis    "stopped breathing" "drops my heartbeat" "I just pass out"    Current Outpatient Prescriptions  Medication Sig Dispense Refill  . acetaminophen (TYLENOL) 500 MG tablet Take 500 mg by mouth every 6 (six) hours as needed for headache.    . allopurinol (ZYLOPRIM) 300 MG tablet TAKE 1 TABLET DAILY 90 tablet 3  . aspirin 81 MG tablet Take 81 mg by mouth daily.    . Calcium Citrate (CITRACAL PO) Take 4 tablets by mouth daily.     . cholecalciferol (VITAMIN D) 1000 UNITS tablet Take 4,000 Units by mouth daily.    Marland Kitchen ELESTRIN 0.52 MG/0.87 GM (0.06%) GEL APPLY 2 PUMPS THINLY TO UPPER ARM DAILY AT BEDTIME 156 g 2  . IMBRUVICA 140 MG capsul TAKE 3 CAPSULES (420 MG TOTAL) DAILY 270 capsule 1  . lidocaine-prilocaine (EMLA) cream Apply a nickel size amount to skin on top of port- cover with saran wrap at least 1 hour before access 30 g 0  . progesterone (PROMETRIUM) 200 MG capsule TAKE 1 CAPSULE ON DAYS 1 THROUGH 12 EVERY OTHER MONTH 24 capsule 1  . traZODone (DESYREL) 50 MG tablet Take 1 tablet (50 mg  total) by mouth at bedtime. 90 tablet 4  . acyclovir (ZOVIRAX) 400 MG tablet TAKE 1 TABLET TWICE A DAY 180 tablet 3   No current facility-administered medications for this visit.     Review of Systems Review of Systems Constitutional: negative for fatigue and weight loss Respiratory: positive for runny nose and sneezing Cardiovascular: negative for chest pain, fatigue and palpitations Gastrointestinal: negative for abdominal pain and change in bowel habits Genitourinary:positive for vaginal discharge with odor and irritation Integument/breast: negative for nipple discharge Musculoskeletal:negative for myalgias Neurological: negative for gait problems and tremors Behavioral/Psych: negative for abusive relationship, depression Endocrine: negative for temperature intolerance      Blood pressure 101/66, pulse 76, weight 123 lb 3.2 oz (55.9 kg).  Physical Exam Physical Exam:           General:  Alert and no distress          Lungs:  Clear          Heart:  RRR Abdomen:  normal findings: no organomegaly, soft, non-tender and no hernia  Pelvis:  External genitalia: normal general appearance Urinary system: urethral meatus normal and bladder without fullness, nontender Vaginal: normal without tenderness,  induration or masses Cervix: normal appearance Adnexa: normal bimanual exam Uterus: anteverted and non-tender, normal size    50% of 15 min visit spent on counseling and coordination of care.    Data Reviewed Labs  Assessment and Plan:    1. Vaginal discharge Rx: - Cervicovaginal ancillary only  2. Painful urination Rx: - Urine Culture  3. Non-seasonal allergic rhinitis, unspecified trigger Rx: - Allegra recommended    Plan    Follow up prn  Orders Placed This Encounter  Procedures  . Urine Culture   No orders of the defined types were placed in this encounter.         Patient ID: Rachel Sang Butner, female   DOB: Jan 05, 1951, 66 y.o.   MRN: [id number]

## 2016-09-24 NOTE — Progress Notes (Signed)
Patient states she is having abdominal pain for 2-3 days. Patient is having urinary leakage. Patient states she has no other symptoms. Patient also has discharge with odor.

## 2016-09-26 LAB — URINE CULTURE

## 2016-09-26 LAB — CERVICOVAGINAL ANCILLARY ONLY
BACTERIAL VAGINITIS: NEGATIVE
Candida vaginitis: NEGATIVE

## 2016-09-29 ENCOUNTER — Telehealth: Payer: Self-pay | Admitting: *Deleted

## 2016-09-29 NOTE — Telephone Encounter (Signed)
Pt called to office for results. Spoke with pt and results given.   Pt states she would like to make Dr Rachel [name] aware that she is having trouble with Hot Flashes. Pt states that she is taking medication as she should but is still having problems. Pt states she may have 10 hot flashes a day. Pt would like to know of changes and/or recommendations.  Pt aware message to be sent to provider.       Please advise on hot flashes / medication.

## 2016-09-30 ENCOUNTER — Other Ambulatory Visit (HOSPITAL_BASED_OUTPATIENT_CLINIC_OR_DEPARTMENT_OTHER): Payer: Medicare Other

## 2016-09-30 ENCOUNTER — Ambulatory Visit (HOSPITAL_BASED_OUTPATIENT_CLINIC_OR_DEPARTMENT_OTHER): Payer: Medicare Other | Admitting: Oncology

## 2016-09-30 ENCOUNTER — Ambulatory Visit (HOSPITAL_BASED_OUTPATIENT_CLINIC_OR_DEPARTMENT_OTHER): Payer: Medicare Other

## 2016-09-30 VITALS — BP 99/56 | HR 79 | Temp 98.2°F | Resp 18 | Ht <= 58 in | Wt 122.2 lb

## 2016-09-30 DIAGNOSIS — C9110 Chronic lymphocytic leukemia of B-cell type not having achieved remission: Secondary | ICD-10-CM

## 2016-09-30 DIAGNOSIS — Z95828 Presence of other vascular implants and grafts: Secondary | ICD-10-CM

## 2016-09-30 DIAGNOSIS — D696 Thrombocytopenia, unspecified: Secondary | ICD-10-CM

## 2016-09-30 LAB — CBC WITH DIFFERENTIAL/PLATELET
BASO%: 1.3 % (ref 0.0–2.0)
Basophils Absolute: 0.3 10*3/uL — ABNORMAL HIGH (ref 0.0–0.1)
EOS ABS: 0.3 10*3/uL (ref 0.0–0.5)
EOS%: 1.2 % (ref 0.0–7.0)
HCT: 40.5 % (ref 34.8–46.6)
HEMOGLOBIN: 13.3 g/dL (ref 11.6–15.9)
LYMPH%: 80.4 % — ABNORMAL HIGH (ref 14.0–49.7)
MCH: 29.5 pg (ref 25.1–34.0)
MCHC: 32.8 g/dL (ref 31.5–36.0)
MCV: 90 fL (ref 79.5–101.0)
MONO#: 0.6 10*3/uL (ref 0.1–0.9)
MONO%: 2.4 % (ref 0.0–14.0)
NEUT%: 14.7 % — ABNORMAL LOW (ref 38.4–76.8)
NEUTROS ABS: 3.5 10*3/uL (ref 1.5–6.5)
PLATELETS: 137 10*3/uL — AB (ref 145–400)
RBC: 4.49 10*6/uL (ref 3.70–5.45)
RDW: 12.4 % (ref 11.2–14.5)
WBC: 24.1 10*3/uL — AB (ref 3.9–10.3)
lymph#: 19.4 10*3/uL — ABNORMAL HIGH (ref 0.9–3.3)

## 2016-09-30 LAB — COMPREHENSIVE METABOLIC PANEL
ALBUMIN: 4.1 g/dL (ref 3.5–5.0)
ALK PHOS: 83 U/L (ref 40–150)
ALT: 14 U/L (ref 0–55)
AST: 19 U/L (ref 5–34)
Anion Gap: 9 mEq/L (ref 3–11)
BILIRUBIN TOTAL: 0.59 mg/dL (ref 0.20–1.20)
BUN: 23.8 mg/dL (ref 7.0–26.0)
CO2: 26 mEq/L (ref 22–29)
CREATININE: 0.9 mg/dL (ref 0.6–1.1)
Calcium: 9.5 mg/dL (ref 8.4–10.4)
Chloride: 100 mEq/L (ref 98–109)
EGFR: 63 mL/min/{1.73_m2} — AB (ref >90)
GLUCOSE: 77 mg/dL (ref 70–140)
Potassium: 3.9 mEq/L (ref 3.5–5.1)
SODIUM: 136 meq/L (ref 136–145)
TOTAL PROTEIN: 6.5 g/dL (ref 6.4–8.3)

## 2016-09-30 LAB — TECHNOLOGIST REVIEW

## 2016-09-30 LAB — LACTATE DEHYDROGENASE: LDH: 251 U/L — ABNORMAL HIGH (ref 125–245)

## 2016-09-30 MED ORDER — SODIUM CHLORIDE 0.9% FLUSH
10.0000 mL | Freq: Once | INTRAVENOUS | Status: AC
  Administered 2016-09-30: 10 mL
  Filled 2016-09-30: qty 10

## 2016-09-30 MED ORDER — HEPARIN SOD (PORK) LOCK FLUSH 100 UNIT/ML IV SOLN
500.0000 [IU] | Freq: Once | INTRAVENOUS | Status: AC
  Administered 2016-09-30: 500 [IU]
  Filled 2016-09-30: qty 5

## 2016-09-30 MED ORDER — VENLAFAXINE HCL ER 75 MG PO CP24: 75.0000 mg | ORAL_CAPSULE | Freq: Every day | ORAL | 4 refills | Status: DC

## 2016-09-30 MED ORDER — IBRUTINIB 420 MG PO TABS: 420.0000 mg | ORAL_TABLET | Freq: Every day | ORAL | 4 refills | Status: DC

## 2016-09-30 NOTE — Progress Notes (Signed)
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 She has been treated with Rituxan, ofatumumab, cladribine, bendamustine and currently with ibrutinib, with details summarized below.  INTERVAL HISTORY: Rachel [name] returns today for follow-up and treatment of her chronic lymphocytic leukemia accompanied by her husband Rachel [name]. She continues on ibrutinib, with good tolerance.  She is having mild bruising which is going to be related to this drug. It is known to interfere with platelet activation. She is not on any other anticoagulants including nonsteroidals, fish oil her vitamin E or turmeric. She has not had problems with worsening blood pressure (in fact her blood pressure today was on the low side) and has had no problems with palpitations. She denies rash or fever, or any problems with adenopathy. Marland Kitchen REVIEW OF SYSTEMS: Rachel [name] is having significant sinus problems with a very congested "head", continuing headaches, and a runny nose. She has other issues including heartburn, loose bowel movements, stress urinary incontinence, forgetfulness, anxiety and depression. Hot flashes are particularly bothersome currently. She is trying to get off hormones because of concerns regarding breast cancer. Aside from these issues a detailed review of systems today was stable   PAST MEDICAL HISTORY: Past Medical History:  Diagnosis Date  . Cancer ([place])   . Diverticulitis   . Leukemia ([place])   . Lower back pain     PAST SURGICAL HISTORY: Past Surgical History:  Procedure Laterality Date  . BREAST SURGERY  2015   left breast bx  . BUNIONECTOMY     right  foot  . lining of uterus removed    . OTHER SURGICAL HISTORY     removal of uterine lining  . TONSILLECTOMY      FAMILY HISTORY Family History  Problem Relation Age of Onset  . Depression Son     GYNECOLOGIC HISTORY: Menarche age 13, first live birth age 21, she is [place] P3. She stopped having periods in 1999 after endometrial stripping. She continues on hormone replacement.  SOCIAL HISTORY: Her husband Rachel [name] is retired from [employer]. Her son [name] lives in [place] and has 3 sons; he works for a Pharmacist, community. A second son lives in [place] and has two children. Son [name] lives in [place] and has a daughter, as well as 2 sons by marriage.   ADVANCED DIRECTIVES: In place  HEALTH MAINTENANCE: Social History  Substance Use Topics  . Smoking status: Never Smoker  . Smokeless tobacco: Never Used  . Alcohol use 0.0 oz/week     Comment: socially     Colonoscopy: due  PAP: December 2012/ [name]  Bone density: Due  Lipid panel: per Dr Drema Dallas  Allergies  Allergen Reactions  . Penicillins Anaphylaxis    "stopped breathing" "drops my heartbeat" "I just pass out"    Current Outpatient Prescriptions  Medication Sig Dispense Refill  . acetaminophen (TYLENOL) 500 MG tablet Take 500 mg by mouth every 6 (six) hours as needed for headache.    Marland Kitchen acyclovir (ZOVIRAX) 400 MG tablet TAKE 1 TABLET TWICE A DAY 180 tablet 3  . allopurinol (ZYLOPRIM) 300 MG tablet TAKE 1 TABLET DAILY 90 tablet 3  . aspirin 81 MG tablet Take 81 mg by mouth daily.    Marland Kitchen  Calcium Citrate (CITRACAL PO) Take 4 tablets by mouth daily.     . cholecalciferol (VITAMIN D) 1000 UNITS tablet Take 4,000 Units by mouth daily.    Marland Kitchen ELESTRIN 0.52 MG/0.87 GM (0.06%) GEL APPLY 2 PUMPS THINLY TO UPPER ARM DAILY AT BEDTIME 156 g 2  . IMBRUVICA 140 MG capsul TAKE 3 CAPSULES (420 MG TOTAL) DAILY 270 capsule 1  . lidocaine-prilocaine (EMLA) cream Apply a nickel size amount to skin on top of port- cover with saran wrap at  least 1 hour before access 30 g 0  . progesterone (PROMETRIUM) 200 MG capsule TAKE 1 CAPSULE ON DAYS 1 THROUGH 12 EVERY OTHER MONTH 24 capsule 1  . traZODone (DESYREL) 50 MG tablet Take 1 tablet (50 mg total) by mouth at bedtime. 90 tablet 4   No current facility-administered medications for this visit.     OBJECTIVE: Middle-aged white woman in no acute distress  Vitals:   09/30/16 1452  BP: (!) 99/56  Pulse: 79  Resp: 18  Temp: 98.2 F (36.8 C)   Filed Weights   09/30/16 1452  Weight: 122 lb 3.2 oz (55.4 kg)   Body mass index is 25.54 kg/m.    ECOG FS: 1  Sclerae unicteric, pupils round and equal Oropharynx clear and moist No cervical or supraclavicular adenopathy, no axillary or inguinal adenopathy Lungs no rales or rhonchi Heart regular rate and rhythm Abd soft, nontender, positive bowel sounds MSK no focal spinal tenderness, no upper extremity lymphedema Neuro: nonfocal, well oriented, appropriate affect Breasts: Deferred    LAB RESULTS:  Lab Results  Component Value Date   WBC 24.1 (H) 09/30/2016   NEUTROABS 3.5 09/30/2016   HGB 13.3 09/30/2016   HCT 40.5 09/30/2016   MCV 90.0 09/30/2016   PLT 137 (L) 09/30/2016      Chemistry      Component Value Date/Time   NA 139 09/12/2016 1116   K 4.2 09/12/2016 1116   CL 101 02/18/2015 2200   CL 101 12/15/2011 1322   CO2 27 09/12/2016 1116   BUN 13.5 09/12/2016 1116   CREATININE 0.9 09/12/2016 1116      Component Value Date/Time   CALCIUM 9.3 09/12/2016 1116   ALKPHOS 87 09/12/2016 1116   AST 20 09/12/2016 1116   ALT 14 09/12/2016 1116   BILITOT 0.68 09/12/2016 1116     STUDIES: No results found.   ASSESSMENT: 66 y.o.  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 rigors 02/27/2014: received IV vancomycin and levaquin briefly, then levaquin po (pcn allergy)  (6) bendamustine/ rituximab for 6 cycles, completed 05/04/2014, with improvement but not normalization of the absolute lymphocyte count  (7) ibrutinib started 05/08/2014, currently at 420mg  daily  PLAN: Rachel [name] is very stable on the ibrutinib and the plan is to continue on that until there is evidence of rakes through from the cancer or intolerance. The main problem she is having right now is  bruising. This is clearly an effect of the drug and it is similar to taking an aspirin daily. She is not having overt bleeding. She was reassured by this discussion today.  We tested her for hepatitis B remotely, prior to starting her right toxin, but I can no longer find those labs. Just before completion I'm going to repeat that before her next visit.  We are changing the dose to a single 420 mg tablets instead of her having to take 3 tablets daily.  She is very much bothered by hot flashes especially now that she is going on for hormones. I think she would benefit from the left vaccine and I put the prescription and after discussing the possible toxicities side effects and complications with her. We are starting at a 75 mg dose. If after 3 or 4 weeks she finds this is not sufficient she will let us know and we will double the dose.  I think her headaches are clearly related to sinus problems. She is already on medication for this. She does not need antibiotics as she is not having fevers. I think what would really help  her is vigorous exercise and we discussed that.  Otherwise she will be traveling in [place] through September and part of October. She will see me again in November. She knows to call for any problems that may develop before that visit.  Chauncey Cruel, MD 09/30/2016

## 2016-10-01 ENCOUNTER — Other Ambulatory Visit: Payer: Self-pay

## 2016-10-01 MED ORDER — IBRUTINIB 420 MG PO TABS: 420.0000 mg | ORAL_TABLET | Freq: Every day | ORAL | 4 refills | Status: DC

## 2016-10-01 NOTE — Telephone Encounter (Signed)
Pt called to office regarding medication and hot flashes. Pt states she was seen yesterday at cancer center and her provider gave her new Rx for hot flashes.  Pt does not know name of Rx. Pt would like to know if she should wean herself off of current hormone treatment or take along with new Rx at this time. Pt made aware message to be sent to Dr Jodi Mourning for recommendations.    Please review med list for new Rx that were given on 8/7 as pt does not know name of meds. Advise on current hormone therapy and how to take medication together or to wean.

## 2016-10-01 NOTE — Telephone Encounter (Signed)
Per pt phone call Ibrutinib script was sen to the wrong pharmacy yesterday.  This RN has sent the script to [facility] per pt request.

## 2016-10-03 ENCOUNTER — Other Ambulatory Visit: Payer: Self-pay | Admitting: Obstetrics

## 2016-10-10 ENCOUNTER — Encounter: Payer: Self-pay | Admitting: *Deleted

## 2016-10-10 ENCOUNTER — Other Ambulatory Visit: Payer: Self-pay | Admitting: *Deleted

## 2016-10-15 ENCOUNTER — Other Ambulatory Visit: Payer: Self-pay | Admitting: *Deleted

## 2016-10-15 MED ORDER — IBRUTINIB 420 MG PO TABS: 420.0000 mg | ORAL_TABLET | Freq: Every day | ORAL | 0 refills | Status: DC

## 2016-10-15 NOTE — Telephone Encounter (Signed)
Per call received from pt - she states she contacted [facility] and was informed for her to obtain more then a 1 month supply of Imbruciva - prescription needs to be sent for 56 tab and include on prescription " vacation supply".  Order sent and verified by post call.

## 2016-11-11 ENCOUNTER — Telehealth: Payer: Self-pay | Admitting: Oncology

## 2016-11-11 NOTE — Telephone Encounter (Signed)
Attempted to leave a voicemail and the voicemail was for someone named Rachel [name]. Other number is not working. Sending a letter confirmation.

## 2016-12-23 ENCOUNTER — Telehealth: Payer: Self-pay | Admitting: *Deleted

## 2016-12-23 ENCOUNTER — Encounter: Payer: Self-pay | Admitting: *Deleted

## 2016-12-23 NOTE — Telephone Encounter (Signed)
Per VM -  Pt inquired that she would like to proceed to a dental cleaning and request clearance.  Letter for ok to proceed written - message left on pt's VM stating this but need name of dentist to fax per her request.

## 2016-12-30 NOTE — Progress Notes (Signed)
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 She has been treated with Rituxan, ofatumumab, cladribine, bendamustine and currently with ibrutinib, with details summarized below.  INTERVAL HISTORY: Rachel [name] returns today for follow-up and treatment of her chronic lymphoid leukemia.  She continues on Ibrutinib, with good tolerance. She pays $50 for 28 day supply of the 420 mg tablets, but only $25 for 45-day supply of the 140 mg tablets.  She would prefer to have the latter available.  She reports that she started having a  dry mouth with the new dosage. She takes 3 tablets in the morning.  Otherwise she is reporting no side effects from these treatments.   REVIEW OF SYSTEMS: Rachel [name] reports she recently travelled to [place] in September 2018 and became sick with a sinus infection. She was seen by a PCP and was later seen in the ED. She was prescribed a 7 day antibiotic, and had to temporarily stop taking Ibrutinib. She discontinued use of Ibrutinib for a total of 9 days and then started back again. While in [place], she noted acid reflux due to wine that she had on the plane prior to landing. She was able to walk in [place] for her exercise while overseas. For exercise, she does 3 yoga and 3 Zumba classes and she would like to start doing free weights. She denies unusual headaches, visual changes, nausea, vomiting, or dizziness. There has been no unusual cough, phlegm production, or pleurisy. This been no change in bowel or bladder habits. She denies unexplained fatigue or unexplained weight loss, bleeding, rash, or fever. A detailed review of systems  was otherwise stable.    PAST MEDICAL HISTORY: Past Medical History:  Diagnosis Date  . Cancer ([place])   . Diverticulitis   . Leukemia ([place])   . Lower back pain     PAST SURGICAL HISTORY: Past Surgical History:  Procedure Laterality Date  . BREAST SURGERY  2015   left breast bx  . BUNIONECTOMY     right foot  . lining of uterus removed    . OTHER SURGICAL HISTORY     removal of uterine lining  . TONSILLECTOMY      FAMILY HISTORY Family History  Problem Relation Age of Onset  . Depression Son     GYNECOLOGIC HISTORY: Menarche age 42, first live birth age 78, she is [place] P3. She stopped having periods in 1999 after endometrial stripping. She continues on hormone replacement.  SOCIAL HISTORY: [name] is retired from [employer]. Her son [name] lives in [place] and has 3 sons; he works for a Pharmacist, community. A second son lives in [place] and has two children. Son [name] lives in [place] and has a daughter, as well as 2 sons by marriage.   ADVANCED DIRECTIVES: In place  HEALTH MAINTENANCE: Social History   Tobacco Use  . Smoking status: Never Smoker  . Smokeless tobacco: Never Used  Substance Use Topics  . Alcohol use: Yes    Alcohol/week: 0.0 oz    Comment: socially  . Drug use: No     Colonoscopy: due  PAP: December 2012/ [name]  Bone density: Due  Lipid panel: per Dr Drema Dallas  Allergies  Allergen Reactions  . Penicillins Anaphylaxis    "stopped breathing" "drops my heartbeat" "I just pass out"    Current Outpatient Medications  Medication Sig Dispense Refill  . acetaminophen (TYLENOL) 500 MG tablet Take 500 mg by mouth every 6 (six) hours as needed for headache.    Marland Kitchen acyclovir (ZOVIRAX) 400 MG tablet TAKE 1 TABLET TWICE A DAY 180 tablet 3  . allopurinol (ZYLOPRIM) 300 MG tablet TAKE 1 TABLET DAILY 90 tablet 3  . Calcium Citrate (CITRACAL PO) Take 4 tablets by mouth daily.     . cholecalciferol (VITAMIN D) 1000 UNITS tablet Take 4,000  Units by mouth daily.    Marland Kitchen ELESTRIN 0.52 MG/0.87 GM (0.06%) GEL APPLY 2 PUMPS THINLY TO UPPER ARM DAILY AT BEDTIME 156 g 2  . Ibrutinib 420 MG TABS Take 420 mg by mouth daily at 3 pm. 56 tablet 0  . lidocaine-prilocaine (EMLA) cream Apply a nickel size amount to skin on top of port- cover with saran wrap at least 1 hour before access 30 g 0  . progesterone (PROMETRIUM) 200 MG capsule TAKE 1 CAPSULE ON DAYS 1 THROUGH 12 EVERY OTHER MONTH 24 capsule 1  . traZODone (DESYREL) 50 MG tablet Take 1 tablet (50 mg total) by mouth at bedtime. 90 tablet 4  . IMBRUVICA 140 MG capsul TAKE 3 CAPSULES (420 MG TOTAL) DAILY (Patient not taking: Reported on 12/31/2016) 270 capsule 1  . venlafaxine XR (EFFEXOR-XR) 75 MG 24 hr capsule Take 1 capsule (75 mg total) by mouth daily with breakfast. (Patient not taking: Reported on 12/31/2016) 90 capsule 4   No current facility-administered medications for this visit.     OBJECTIVE: Middle-aged white woman who appears well  Vitals:   12/31/16 1538  BP: (!) 115/41  Pulse: 72  Resp: 18  Temp: 97.9 F (36.6 C)  SpO2: 98%   Filed Weights   12/31/16 1538  Weight: 119 lb 8 oz (54.2 kg)   Body mass index is 24.98 kg/m.    ECOG FS: 0  Sclerae unicteric, EOMs intact Oropharynx clear and moist No cervical or supraclavicular adenopathy, no axillary or inguinal adenopathy Lungs no rales or rhonchi Heart regular rate and rhythm Abd soft, nontender, positive bowel sounds MSK no focal spinal tenderness, no upper extremity lymphedema Neuro: nonfocal, well oriented, appropriate affect Breasts: Deferred    LAB RESULTS:  Lab Results  Component Value Date   WBC 26.9 (H) 12/31/2016   NEUTROABS 5.6 12/31/2016   HGB 12.7 12/31/2016   HCT 38.4 12/31/2016   MCV 92.8 12/31/2016   PLT 145 12/31/2016      Chemistry      Component Value Date/Time   NA 140 12/31/2016 1406   K 3.6 12/31/2016 1406   CL 101 02/18/2015 2200   CL 101 12/15/2011 1322   CO2 24 12/31/2016  1406   BUN 12.8 12/31/2016 1406   CREATININE 0.9 12/31/2016 1406      Component Value Date/Time   CALCIUM 8.8 12/31/2016 1406   ALKPHOS 71 12/31/2016 1406   AST 18 12/31/2016 1406   ALT 11 12/31/2016 1406   BILITOT 0.49 12/31/2016 1406     STUDIES: Bilateral diagnostic mammography scheduled for February 2019  ASSESSMENT: 66 y.o.  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 rigors 02/27/2014: received IV vancomycin and levaquin briefly, then levaquin po (pcn allergy)  (6) bendamustine/ rituximab for 6 cycles, completed 05/04/2014, with improvement but not normalization of the absolute lymphocyte count  (7) ibrutinib started 05/08/2014, currently at 420mg  daily  (a) hepatitis B studies March 06 2014-  PLAN: Rachel [name] is now nearly 17 years out from definitive diagnosis of her chronic lymphocytic leukemia, with very well controlled disease.  She specifically has no thrombocytopenia or anemia and her white cell count is acceptable on her current dose of ibrutinib.  She is also tolerating that well.  There are significant family issues which she discussed today.  The plan at this point is to continue the ibrutinib indefinitely until there is disease progression or intolerance.  For cost reasons were going back to the 3 tablets once a day instead of a single  tablet daily, the latter being more convenient but also more expensive.  She is going to return to see me in 3 months.  She knows to call for any problems that may develop before that visit.  Tessa Seaberry, Virgie Dad, MD  12/31/16 4:11 PM Medical Oncology and Hematology [facility] [address] [place], [place] [address] Tel. [phone]    Fax. [phone]  This document serves as a record of services personally performed by Lurline Del, MD. It was created on his behalf by Sheron Nightingale, a trained medical scribe. The creation of this record is based on the scribe's personal observations and the provider's statements to them.   I have reviewed the above documentation for accuracy and completeness, and I agree with the above.

## 2016-12-31 ENCOUNTER — Ambulatory Visit: Payer: Medicare Other | Admitting: Oncology

## 2016-12-31 ENCOUNTER — Other Ambulatory Visit: Payer: Medicare Other

## 2016-12-31 ENCOUNTER — Ambulatory Visit (HOSPITAL_BASED_OUTPATIENT_CLINIC_OR_DEPARTMENT_OTHER): Payer: Medicare Other | Admitting: Oncology

## 2016-12-31 ENCOUNTER — Other Ambulatory Visit (HOSPITAL_BASED_OUTPATIENT_CLINIC_OR_DEPARTMENT_OTHER): Payer: Medicare Other

## 2016-12-31 ENCOUNTER — Ambulatory Visit (HOSPITAL_BASED_OUTPATIENT_CLINIC_OR_DEPARTMENT_OTHER): Payer: Medicare Other

## 2016-12-31 VITALS — BP 115/41 | HR 72 | Temp 97.9°F | Resp 18 | Ht <= 58 in | Wt 119.5 lb

## 2016-12-31 DIAGNOSIS — C9110 Chronic lymphocytic leukemia of B-cell type not having achieved remission: Secondary | ICD-10-CM

## 2016-12-31 DIAGNOSIS — Z95828 Presence of other vascular implants and grafts: Secondary | ICD-10-CM

## 2016-12-31 DIAGNOSIS — T148XXA Other injury of unspecified body region, initial encounter: Secondary | ICD-10-CM

## 2016-12-31 DIAGNOSIS — D696 Thrombocytopenia, unspecified: Secondary | ICD-10-CM | POA: Diagnosis not present

## 2016-12-31 DIAGNOSIS — C9190 Lymphoid leukemia, unspecified not having achieved remission: Secondary | ICD-10-CM | POA: Diagnosis not present

## 2016-12-31 LAB — COMPREHENSIVE METABOLIC PANEL
ALBUMIN: 3.6 g/dL (ref 3.5–5.0)
ALK PHOS: 71 U/L (ref 40–150)
ALT: 11 U/L (ref 0–55)
ANION GAP: 8 meq/L (ref 3–11)
AST: 18 U/L (ref 5–34)
BILIRUBIN TOTAL: 0.49 mg/dL (ref 0.20–1.20)
BUN: 12.8 mg/dL (ref 7.0–26.0)
CALCIUM: 8.8 mg/dL (ref 8.4–10.4)
CO2: 24 mEq/L (ref 22–29)
Chloride: 108 mEq/L (ref 98–109)
Creatinine: 0.9 mg/dL (ref 0.6–1.1)
Glucose: 125 mg/dl (ref 70–140)
POTASSIUM: 3.6 meq/L (ref 3.5–5.1)
Sodium: 140 mEq/L (ref 136–145)
TOTAL PROTEIN: 6 g/dL — AB (ref 6.4–8.3)

## 2016-12-31 LAB — CBC WITH DIFFERENTIAL/PLATELET
BASO%: 0.2 % (ref 0.0–2.0)
Basophils Absolute: 0.1 10*3/uL (ref 0.0–0.1)
EOS%: 2.1 % (ref 0.0–7.0)
Eosinophils Absolute: 0.6 10*3/uL — ABNORMAL HIGH (ref 0.0–0.5)
HCT: 38.4 % (ref 34.8–46.6)
HEMOGLOBIN: 12.7 g/dL (ref 11.6–15.9)
LYMPH#: 20.2 10*3/uL — AB (ref 0.9–3.3)
LYMPH%: 75.1 % — ABNORMAL HIGH (ref 14.0–49.7)
MCH: 30.7 pg (ref 25.1–34.0)
MCHC: 33.1 g/dL (ref 31.5–36.0)
MCV: 92.8 fL (ref 79.5–101.0)
MONO#: 0.4 10*3/uL (ref 0.1–0.9)
MONO%: 1.6 % (ref 0.0–14.0)
NEUT%: 21 % — AB (ref 38.4–76.8)
NEUTROS ABS: 5.6 10*3/uL (ref 1.5–6.5)
Platelets: 145 10*3/uL (ref 145–400)
RBC: 4.14 10*6/uL (ref 3.70–5.45)
RDW: 12.9 % (ref 11.2–14.5)
WBC: 26.9 10*3/uL — AB (ref 3.9–10.3)
nRBC: 0 % (ref 0–0)

## 2016-12-31 LAB — LACTATE DEHYDROGENASE: LDH: 227 U/L (ref 125–245)

## 2016-12-31 MED ORDER — IBRUTINIB 140 MG PO TABS: 420.0000 mg | ORAL_TABLET | Freq: Every day | ORAL | 6 refills | Status: DC

## 2016-12-31 MED ORDER — SODIUM CHLORIDE 0.9% FLUSH
10.0000 mL | Freq: Once | INTRAVENOUS | Status: AC
  Administered 2016-12-31: 10 mL
  Filled 2016-12-31: qty 10

## 2016-12-31 MED ORDER — HEPARIN SOD (PORK) LOCK FLUSH 100 UNIT/ML IV SOLN
500.0000 [IU] | Freq: Once | INTRAVENOUS | Status: AC
  Administered 2016-12-31: 500 [IU]
  Filled 2016-12-31: qty 5

## 2016-12-31 NOTE — Patient Instructions (Signed)

## 2017-01-01 LAB — HEPATITIS C ANTIBODY: Hep C Virus Ab: <0.1 {s_co_ratio} (ref 0.0–0.9)

## 2017-01-01 LAB — HEPATITIS B SURFACE ANTIGEN: HBsAg Screen: NEGATIVE

## 2017-01-01 LAB — HEPATITIS B CORE ANTIBODY, IGM: Hep B Core Ab, IgM: NEGATIVE

## 2017-01-01 LAB — BETA 2 MICROGLOBULIN, SERUM: Beta-2: 3.6 mg/L — ABNORMAL HIGH (ref 0.6–2.4)

## 2017-01-02 ENCOUNTER — Other Ambulatory Visit: Payer: Self-pay | Admitting: *Deleted

## 2017-01-02 ENCOUNTER — Ambulatory Visit (HOSPITAL_BASED_OUTPATIENT_CLINIC_OR_DEPARTMENT_OTHER): Payer: Medicare Other

## 2017-01-02 VITALS — BP 117/72 | HR 85 | Temp 98.0°F | Resp 20

## 2017-01-02 DIAGNOSIS — C9110 Chronic lymphocytic leukemia of B-cell type not having achieved remission: Secondary | ICD-10-CM

## 2017-01-02 DIAGNOSIS — Z23 Encounter for immunization: Secondary | ICD-10-CM

## 2017-01-02 MED ORDER — INFLUENZA VAC SPLIT HIGH-DOSE 0.5 ML IM SUSY
0.5000 mL | PREFILLED_SYRINGE | INTRAMUSCULAR | Status: AC
  Administered 2017-01-02: 0.5 mL via INTRAMUSCULAR
  Filled 2017-01-02: qty 0.5

## 2017-01-02 NOTE — Patient Instructions (Signed)

## 2017-01-05 ENCOUNTER — Telehealth: Payer: Self-pay | Admitting: Oncology

## 2017-01-05 NOTE — Telephone Encounter (Signed)
Scheduled appt per 11/07 los. - sent reminder letter in the mail.

## 2017-01-07 ENCOUNTER — Telehealth: Payer: Self-pay | Admitting: *Deleted

## 2017-01-07 MED ORDER — HYDROXYZINE HCL 25 MG PO TABS: 25.0000 mg | ORAL_TABLET | Freq: Three times a day (TID) | ORAL | 0 refills | Status: DC | PRN

## 2017-01-07 NOTE — Telephone Encounter (Signed)
Message left by

## 2017-01-07 NOTE — Telephone Encounter (Signed)
This RN spoke with pt per her VM stating onset of " having panic attacks " and request for medication.  She states onset started last week " It is when I am in my home - and with what is happening - I get a heavy feeling in my chest and it escalates into a panicky feeling "  Rachel [name] states above is relieved when she leaves the home " like this morning I went to the gym and did yoga for 2 hours but now I am beginning to have this feeling again "  Per discussion of above and current home environment - she believes change will take place after Thanksgiving.  Per review with MD - atarax prescription sent to pt's pharmacy.

## 2017-01-21 DIAGNOSIS — F99 Mental disorder, not otherwise specified: Secondary | ICD-10-CM | POA: Diagnosis not present

## 2017-01-23 ENCOUNTER — Telehealth: Payer: Self-pay

## 2017-01-23 ENCOUNTER — Other Ambulatory Visit: Payer: Self-pay | Admitting: Obstetrics

## 2017-01-23 NOTE — Telephone Encounter (Signed)
Fill request routed to Dr. Jodi Mourning.

## 2017-01-29 DIAGNOSIS — F418 Other specified anxiety disorders: Secondary | ICD-10-CM | POA: Diagnosis not present

## 2017-01-29 DIAGNOSIS — R062 Wheezing: Secondary | ICD-10-CM | POA: Diagnosis not present

## 2017-01-29 DIAGNOSIS — H66003 Acute suppurative otitis media without spontaneous rupture of ear drum, bilateral: Secondary | ICD-10-CM | POA: Diagnosis not present

## 2017-02-06 DIAGNOSIS — K2900 Acute gastritis without bleeding: Secondary | ICD-10-CM | POA: Diagnosis not present

## 2017-02-12 DIAGNOSIS — C9110 Chronic lymphocytic leukemia of B-cell type not having achieved remission: Secondary | ICD-10-CM | POA: Diagnosis not present

## 2017-02-12 DIAGNOSIS — K5730 Diverticulosis of large intestine without perforation or abscess without bleeding: Secondary | ICD-10-CM | POA: Diagnosis not present

## 2017-02-12 DIAGNOSIS — J309 Allergic rhinitis, unspecified: Secondary | ICD-10-CM | POA: Diagnosis not present

## 2017-02-12 DIAGNOSIS — D630 Anemia in neoplastic disease: Secondary | ICD-10-CM | POA: Diagnosis not present

## 2017-02-12 DIAGNOSIS — Z0000 Encounter for general adult medical examination without abnormal findings: Secondary | ICD-10-CM | POA: Diagnosis not present

## 2017-02-12 DIAGNOSIS — F3341 Major depressive disorder, recurrent, in partial remission: Secondary | ICD-10-CM | POA: Diagnosis not present

## 2017-02-12 DIAGNOSIS — F418 Other specified anxiety disorders: Secondary | ICD-10-CM | POA: Diagnosis not present

## 2017-02-25 DIAGNOSIS — F411 Generalized anxiety disorder: Secondary | ICD-10-CM | POA: Diagnosis not present

## 2017-03-16 ENCOUNTER — Ambulatory Visit (HOSPITAL_COMMUNITY): Payer: Medicare Other | Admitting: Psychiatry

## 2017-03-18 DIAGNOSIS — A084 Viral intestinal infection, unspecified: Secondary | ICD-10-CM | POA: Diagnosis not present

## 2017-03-18 DIAGNOSIS — J0110 Acute frontal sinusitis, unspecified: Secondary | ICD-10-CM | POA: Diagnosis not present

## 2017-03-25 DIAGNOSIS — F411 Generalized anxiety disorder: Secondary | ICD-10-CM | POA: Diagnosis not present

## 2017-03-30 NOTE — Progress Notes (Signed)
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 She has been treated with Rituxan, ofatumumab, cladribine, bendamustine and currently with ibrutinib, with details summarized below.  INTERVAL HISTORY: Rachel [name] returns today for follow-up and treatment of her chronic lymphoid leukemia.  She continues on Ibrutinib, with good tolerance. She obtains this at a good price. She denies lymphadenopathy and fevers. She notes that her energy levels have been better.  Unfortunately the divorce appears to be proceeding and this is a source of great stress for her.    REVIEW OF SYSTEMS: Rachel [name] reports that she has been congested and wheezing. She notes that she was prescribed an inhaler. She notes that she gets light-headed for 2-3 hours in the morning. She notes that she has been drinking more water to improve her kidney function. She notes that she is having a bone density in February at [facility]. She has a mammogram scheduled there during the first week of March.  She notes that she has been exercising. She reports that she and her husband have separated. She denies unusual headaches, visual changes, nausea, vomiting, or dizziness. There has been no unusual cough, phlegm production, or pleurisy. This been no change in bowel or bladder habits. She denies unexplained fatigue or unexplained weight loss, bleeding, rash, or fever. A detailed review of systems was otherwise stable.    PAST MEDICAL HISTORY: Past Medical History:  Diagnosis Date  . Cancer ([place])   . Diverticulitis   . Leukemia ([place])   . Lower back pain     PAST SURGICAL HISTORY: Past Surgical  History:  Procedure Laterality Date  . BREAST SURGERY  2015   left breast bx  . BUNIONECTOMY     right foot  . lining of uterus removed    . OTHER SURGICAL HISTORY     removal of uterine lining  . TONSILLECTOMY      FAMILY HISTORY Family History  Problem Relation Age of Onset  . Depression Son     GYNECOLOGIC HISTORY: Menarche age 66, first live birth age 72, she is [place] P3. She stopped having periods in 1999 after endometrial stripping. She continues on hormone replacement.  SOCIAL HISTORY: Her husband Rachel [name] is retired from [employer]. Her son [name] lives in [place] and has 3 sons; he works for a Pharmacist, community. A second son lives in [place] and has two children. Son [name] lives in [place] and has a daughter, as well as 2 sons by marriage.   ADVANCED DIRECTIVES: In place  HEALTH MAINTENANCE: Social History   Tobacco Use  . Smoking status: Never Smoker  . Smokeless tobacco: Never Used  Substance Use Topics  . Alcohol use: Yes    Alcohol/week: 0.0 oz    Comment: socially  . Drug use: No     Colonoscopy: due  PAP: December 2012/ [name]  Bone density: Due  Lipid panel: per Dr Drema Dallas  Allergies  Allergen Reactions  . Penicillins Anaphylaxis    "stopped breathing" "drops my heartbeat" "I just pass out"    Current Outpatient Medications  Medication Sig Dispense Refill  . acetaminophen (TYLENOL) 500 MG tablet Take 500 mg by mouth every 6 (six) hours as needed for headache.    Marland Kitchen  acyclovir (ZOVIRAX) 400 MG tablet TAKE 1 TABLET TWICE A DAY 180 tablet 3  . allopurinol (ZYLOPRIM) 300 MG tablet TAKE 1 TABLET DAILY 90 tablet 3  . Calcium Citrate (CITRACAL PO) Take 4 tablets by mouth daily.     . cholecalciferol (VITAMIN D) 1000 UNITS tablet Take 4,000 Units by mouth daily.    Marland Kitchen ELESTRIN 0.52 MG/0.87 GM (0.06%) GEL APPLY 2 PUMPS THINLY TO UPPER ARM DAILY AT BEDTIME 156 g 2  . hydrOXYzine (ATARAX/VISTARIL) 25 MG tablet Take 1 tablet (25 mg total) 3 (three) times  daily as needed by mouth. 30 tablet 0  . ibrutinib (IMBRUVICA) 140 MG tablet Take 3 tablets (420 mg total) daily by mouth. 270 tablet 6  . lidocaine-prilocaine (EMLA) cream Apply a nickel size amount to skin on top of port- cover with saran wrap at least 1 hour before access 30 g 0  . progesterone (PROMETRIUM) 200 MG capsule TAKE 1 CAPSULE ON DAYS 1 THROUGH 12 EVERY OTHER MONTH 24 capsule 1  . traZODone (DESYREL) 50 MG tablet Take 1 tablet (50 mg total) by mouth at bedtime. 90 tablet 4  . venlafaxine XR (EFFEXOR-XR) 75 MG 24 hr capsule Take 1 capsule (75 mg total) by mouth daily with breakfast. (Patient not taking: Reported on 12/31/2016) 90 capsule 4   No current facility-administered medications for this visit.     OBJECTIVE: Middle-aged white woman who appears stated age  5:   04/01/17 1420  BP: 101/66  Pulse: 76  Resp: 18  Temp: 98.6 F (37 C)  SpO2: 98%   Filed Weights   04/01/17 1420  Weight: 118 lb 9.6 oz (53.8 kg)   Body mass index is 24.79 kg/m.    ECOG FS: 1  Sclerae unicteric, pupils round and equal Oropharynx clear and moist No cervical or supraclavicular adenopathy, no axillary or inguinal adenopathy Lungs no rales or rhonchi Heart regular rate and rhythm Abd soft, nontender, positive bowel sounds MSK no focal spinal tenderness, no upper extremity lymphedema Neuro: nonfocal, well oriented, appropriate affect Breasts: Deferred     LAB RESULTS:  Lab Results  Component Value Date   WBC 26.9 (H) 12/31/2016   NEUTROABS 5.6 12/31/2016   HGB 12.7 12/31/2016   HCT 38.4 12/31/2016   MCV 92.8 12/31/2016   PLT 145 12/31/2016      Chemistry      Component Value Date/Time   NA 140 12/31/2016 1406   K 3.6 12/31/2016 1406   CL 101 02/18/2015 2200   CL 101 12/15/2011 1322   CO2 24 12/31/2016 1406   BUN 12.8 12/31/2016 1406   CREATININE 0.9 12/31/2016 1406      Component Value Date/Time   CALCIUM 8.8 12/31/2016 1406   ALKPHOS 71 12/31/2016 1406   AST  18 12/31/2016 1406   ALT 11 12/31/2016 1406   BILITOT 0.49 12/31/2016 1406     STUDIES: Bilateral diagnostic mammography scheduled for February 2019  ASSESSMENT: 67 y.o.  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 rigors 02/27/2014: received IV vancomycin and levaquin briefly, then levaquin po (pcn allergy)  (6) bendamustine/ rituximab for 6 cycles, completed 05/04/2014, with improvement but not normalization of the absolute lymphocyte count  (7) ibrutinib started 05/08/2014, currently at 420mg  daily  (a) hepatitis B studies March 06 2014-  PLAN: Rachel [name] is now 17 years out from initial diagnosis of chronic lymphoid leukemia.  She is tolerating ibrutinib generally well and the plan is to continue that until there is evidence of disease progression.  We are doing the 3 tablets a day instead of adding them to 1 tablet because of cost issues.  She is undergoing a divorce which is a very stressful time for her.  I have encouraged her to continue to exercise on a regular basis.  She is getting counseling.  She has had a lot of trouble clearing her upper respiratory infection.  She may be low on IgG.  I am going to check her IgG level on Friday and if it is low I will give her an IVIG infusion  Otherwise I will see her again in 3 months.  She knows to call  for any problems that may develop before that visit.   Magrinat, Virgie Dad, MD  04/01/17 2:47 PM Medical Oncology and Hematology [facility] [address] [place], [place] [address] Tel. [phone]    Fax. [phone]  This document serves as a record of services personally performed by Lurline Del, MD. It was created on his behalf by Sheron Nightingale, a trained medical scribe. The creation of this record is based on the scribe's personal observations and the provider's statements to them.   I have reviewed the above documentation for accuracy and completeness, and I agree with the above.

## 2017-04-01 ENCOUNTER — Telehealth: Payer: Self-pay | Admitting: Oncology

## 2017-04-01 ENCOUNTER — Encounter: Payer: Self-pay | Admitting: Oncology

## 2017-04-01 ENCOUNTER — Inpatient Hospital Stay: Payer: Medicare Other

## 2017-04-01 ENCOUNTER — Inpatient Hospital Stay: Payer: Medicare Other | Attending: Oncology | Admitting: Oncology

## 2017-04-01 VITALS — BP 101/66 | HR 76 | Temp 98.6°F | Resp 18 | Ht <= 58 in | Wt 118.6 lb

## 2017-04-01 DIAGNOSIS — Z9221 Personal history of antineoplastic chemotherapy: Secondary | ICD-10-CM | POA: Diagnosis not present

## 2017-04-01 DIAGNOSIS — Z79890 Hormone replacement therapy (postmenopausal): Secondary | ICD-10-CM | POA: Diagnosis not present

## 2017-04-01 DIAGNOSIS — Z79899 Other long term (current) drug therapy: Secondary | ICD-10-CM

## 2017-04-01 DIAGNOSIS — D696 Thrombocytopenia, unspecified: Secondary | ICD-10-CM

## 2017-04-01 DIAGNOSIS — J069 Acute upper respiratory infection, unspecified: Secondary | ICD-10-CM | POA: Insufficient documentation

## 2017-04-01 DIAGNOSIS — Z9225 Personal history of immunosupression therapy: Secondary | ICD-10-CM | POA: Insufficient documentation

## 2017-04-01 DIAGNOSIS — R42 Dizziness and giddiness: Secondary | ICD-10-CM | POA: Insufficient documentation

## 2017-04-01 DIAGNOSIS — Z452 Encounter for adjustment and management of vascular access device: Secondary | ICD-10-CM | POA: Insufficient documentation

## 2017-04-01 DIAGNOSIS — J0110 Acute frontal sinusitis, unspecified: Secondary | ICD-10-CM | POA: Diagnosis not present

## 2017-04-01 DIAGNOSIS — C9110 Chronic lymphocytic leukemia of B-cell type not having achieved remission: Secondary | ICD-10-CM | POA: Diagnosis not present

## 2017-04-01 LAB — CBC WITH DIFFERENTIAL/PLATELET
Basophils Absolute: 0.1 10*3/uL (ref 0.0–0.1)
Basophils Relative: 0 %
EOS PCT: 0 %
Eosinophils Absolute: 0.1 10*3/uL (ref 0.0–0.5)
HEMATOCRIT: 38.9 % (ref 34.8–46.6)
Hemoglobin: 12.8 g/dL (ref 11.6–15.9)
LYMPHS PCT: 69 %
Lymphs Abs: 13.8 10*3/uL — ABNORMAL HIGH (ref 0.9–3.3)
MCH: 30.6 pg (ref 25.1–34.0)
MCHC: 32.9 g/dL (ref 31.5–36.0)
MCV: 93.1 fL (ref 79.5–101.0)
MONO ABS: 0.4 10*3/uL (ref 0.1–0.9)
Monocytes Relative: 2 %
NEUTROS ABS: 6 10*3/uL (ref 1.5–6.5)
Neutrophils Relative %: 29 %
PLATELETS: 128 10*3/uL — AB (ref 145–400)
RBC: 4.18 MIL/uL (ref 3.70–5.45)
RDW: 12.9 % (ref 11.2–14.5)
WBC: 20.3 10*3/uL — ABNORMAL HIGH (ref 3.9–10.3)

## 2017-04-01 LAB — COMPREHENSIVE METABOLIC PANEL
ALBUMIN: 3.7 g/dL (ref 3.5–5.0)
ALT: 12 U/L (ref 0–55)
AST: 17 U/L (ref 5–34)
Alkaline Phosphatase: 87 U/L (ref 40–150)
Anion gap: 8 (ref 3–11)
BUN: 12 mg/dL (ref 7–26)
CHLORIDE: 104 mmol/L (ref 98–109)
CO2: 27 mmol/L (ref 22–29)
CREATININE: 0.9 mg/dL (ref 0.60–1.10)
Calcium: 8.8 mg/dL (ref 8.4–10.4)
GFR calc Af Amer: >60 mL/min (ref >60)
GFR calc non Af Amer: >60 mL/min (ref >60)
GLUCOSE: 131 mg/dL (ref 70–140)
POTASSIUM: 3.6 mmol/L (ref 3.5–5.1)
SODIUM: 139 mmol/L (ref 136–145)
Total Bilirubin: 0.4 mg/dL (ref 0.2–1.2)
Total Protein: 6.3 g/dL — ABNORMAL LOW (ref 6.4–8.3)

## 2017-04-01 LAB — LACTATE DEHYDROGENASE: LDH: 205 U/L (ref 125–245)

## 2017-04-01 NOTE — Patient Instructions (Signed)

## 2017-04-01 NOTE — Telephone Encounter (Signed)
Gave patient AVs and calendar of upcoming February and May appointments.

## 2017-04-02 LAB — BETA 2 MICROGLOBULIN, SERUM: BETA 2 MICROGLOBULIN: 2.9 mg/L — AB (ref 0.6–2.4)

## 2017-04-03 ENCOUNTER — Inpatient Hospital Stay: Payer: Medicare Other

## 2017-04-03 DIAGNOSIS — C9110 Chronic lymphocytic leukemia of B-cell type not having achieved remission: Secondary | ICD-10-CM

## 2017-04-03 DIAGNOSIS — Z452 Encounter for adjustment and management of vascular access device: Secondary | ICD-10-CM | POA: Diagnosis not present

## 2017-04-03 DIAGNOSIS — Z9225 Personal history of immunosupression therapy: Secondary | ICD-10-CM | POA: Diagnosis not present

## 2017-04-03 DIAGNOSIS — Z9221 Personal history of antineoplastic chemotherapy: Secondary | ICD-10-CM | POA: Diagnosis not present

## 2017-04-03 DIAGNOSIS — R42 Dizziness and giddiness: Secondary | ICD-10-CM | POA: Diagnosis not present

## 2017-04-03 DIAGNOSIS — Z95828 Presence of other vascular implants and grafts: Secondary | ICD-10-CM

## 2017-04-03 DIAGNOSIS — D696 Thrombocytopenia, unspecified: Secondary | ICD-10-CM

## 2017-04-03 DIAGNOSIS — J069 Acute upper respiratory infection, unspecified: Secondary | ICD-10-CM | POA: Diagnosis not present

## 2017-04-03 LAB — COMPREHENSIVE METABOLIC PANEL
ALBUMIN: 3.8 g/dL (ref 3.5–5.0)
ALT: 10 U/L (ref 0–55)
AST: 17 U/L (ref 5–34)
Alkaline Phosphatase: 78 U/L (ref 40–150)
Anion gap: 7 (ref 3–11)
BILIRUBIN TOTAL: 0.7 mg/dL (ref 0.2–1.2)
BUN: 12 mg/dL (ref 7–26)
CO2: 26 mmol/L (ref 22–29)
CREATININE: 0.85 mg/dL (ref 0.60–1.10)
Calcium: 9.3 mg/dL (ref 8.4–10.4)
Chloride: 106 mmol/L (ref 98–109)
GFR calc Af Amer: >60 mL/min (ref >60)
GLUCOSE: 86 mg/dL (ref 70–140)
Potassium: 4.2 mmol/L (ref 3.5–5.1)
Sodium: 139 mmol/L (ref 136–145)
TOTAL PROTEIN: 6.2 g/dL — AB (ref 6.4–8.3)

## 2017-04-03 LAB — CBC WITH DIFFERENTIAL/PLATELET
BASOS PCT: 2 %
Basophils Absolute: 0.4 10*3/uL — ABNORMAL HIGH (ref 0.0–0.1)
EOS PCT: 1 %
Eosinophils Absolute: 0.2 10*3/uL (ref 0.0–0.5)
HCT: 40.3 % (ref 34.8–46.6)
Hemoglobin: 13.2 g/dL (ref 11.6–15.9)
LYMPHS PCT: 70 %
Lymphs Abs: 18.1 10*3/uL — ABNORMAL HIGH (ref 0.9–3.3)
MCH: 29.6 pg (ref 25.1–34.0)
MCHC: 32.8 g/dL (ref 31.5–36.0)
MCV: 90.4 fL (ref 79.5–101.0)
MONO ABS: 0.8 10*3/uL (ref 0.1–0.9)
MONOS PCT: 3 %
Neutro Abs: 6.1 10*3/uL (ref 1.5–6.5)
Neutrophils Relative %: 24 %
PLATELETS: 131 10*3/uL — AB (ref 145–400)
RBC: 4.46 MIL/uL (ref 3.70–5.45)
RDW: 13.2 % (ref 11.2–14.5)
WBC: 25.7 10*3/uL — AB (ref 3.9–10.3)

## 2017-04-03 LAB — LACTATE DEHYDROGENASE: LDH: 233 U/L (ref 125–245)

## 2017-04-03 MED ORDER — HEPARIN SOD (PORK) LOCK FLUSH 100 UNIT/ML IV SOLN
500.0000 [IU] | Freq: Once | INTRAVENOUS | Status: AC
  Administered 2017-04-03: 500 [IU]
  Filled 2017-04-03: qty 5

## 2017-04-03 MED ORDER — SODIUM CHLORIDE 0.9% FLUSH
10.0000 mL | Freq: Once | INTRAVENOUS | Status: AC
  Administered 2017-04-03: 10 mL
  Filled 2017-04-03: qty 10

## 2017-04-04 ENCOUNTER — Encounter: Payer: Self-pay | Admitting: Oncology

## 2017-04-04 LAB — IGG, IGA, IGM
IGA: 59 mg/dL — AB (ref 87–352)
IGG (IMMUNOGLOBIN G), SERUM: 580 mg/dL — AB (ref 700–1600)
IgM (Immunoglobulin M), Srm: 27 mg/dL (ref 26–217)

## 2017-04-06 ENCOUNTER — Other Ambulatory Visit: Payer: Self-pay | Admitting: Oncology

## 2017-04-06 ENCOUNTER — Inpatient Hospital Stay: Payer: Medicare Other

## 2017-04-06 ENCOUNTER — Ambulatory Visit: Payer: Medicare Other

## 2017-04-06 NOTE — Progress Notes (Unsigned)
Rachel [name] came today because she had not received my note that her IgG level actually was adequate.  Accordingly we are canceling her infusion today.  She already has a follow-up appointment with me.  She will let me know if anything else transpired before that next visit.

## 2017-04-09 ENCOUNTER — Telehealth: Payer: Self-pay | Admitting: Medical Oncology

## 2017-04-09 NOTE — Telephone Encounter (Signed)
This RN returned call to pt and discussed noted elevation in WBC per 2 day recheck.   This RN validated pt's concerns - reviewed with pt lab values and daily changes are normal including slight elevation and drops occur if we checked her labs every day there would be variables.  Informed pt overall labs when looking at the differential are good.  Of note Veneda is continuing to have Upper Respiratory issues- but she was able to enjoy Zumba without issues " we have so much fun in there".  Labs done on 2/8 ideally did not require CBC but for review of IGg levels for possible need for IVIG for immune support.  This note will be reviewed with MD for possible need for infusion.

## 2017-04-09 NOTE — Telephone Encounter (Signed)
Upset that her WBC increased in 2 days. She has had cold like symptoms and I told her that may be one reason for the increase. She was upset . She is going to [place] and will be available after 1 . Note to Nurse.

## 2017-04-15 DIAGNOSIS — M8588 Other specified disorders of bone density and structure, other site: Secondary | ICD-10-CM | POA: Diagnosis not present

## 2017-04-15 DIAGNOSIS — Z780 Asymptomatic menopausal state: Secondary | ICD-10-CM | POA: Diagnosis not present

## 2017-04-17 ENCOUNTER — Other Ambulatory Visit: Payer: Self-pay | Admitting: Obstetrics

## 2017-04-23 DIAGNOSIS — F411 Generalized anxiety disorder: Secondary | ICD-10-CM | POA: Diagnosis not present

## 2017-04-29 ENCOUNTER — Ambulatory Visit: Payer: Medicare Other | Admitting: Obstetrics

## 2017-05-18 DIAGNOSIS — Z1231 Encounter for screening mammogram for malignant neoplasm of breast: Secondary | ICD-10-CM | POA: Diagnosis not present

## 2017-05-19 ENCOUNTER — Other Ambulatory Visit (HOSPITAL_COMMUNITY)
Admission: RE | Admit: 2017-05-19 | Discharge: 2017-05-19 | Disposition: A | Discharge status: Home / Self Care | Payer: Medicare Other | Source: Ambulatory Visit | Attending: Obstetrics | Admitting: Obstetrics

## 2017-05-19 ENCOUNTER — Ambulatory Visit (INDEPENDENT_AMBULATORY_CARE_PROVIDER_SITE_OTHER): Payer: Medicare Other | Admitting: Obstetrics

## 2017-05-19 ENCOUNTER — Encounter: Payer: Self-pay | Admitting: Obstetrics

## 2017-05-19 VITALS — BP 107/69 | HR 73 | Wt 119.2 lb

## 2017-05-19 DIAGNOSIS — R87610 Atypical squamous cells of undetermined significance on cytologic smear of cervix (ASC-US): Secondary | ICD-10-CM | POA: Diagnosis not present

## 2017-05-19 DIAGNOSIS — Z01419 Encounter for gynecological examination (general) (routine) without abnormal findings: Secondary | ICD-10-CM

## 2017-05-19 NOTE — Progress Notes (Signed)
UDT:[id number]  Mammogram: 05/18/17   Last Pap: 04/28/2016 Colonoscopy:up to date per pt   Contraception: None  PCP: Thurnell Lose   STD Screening: None   JK:QASU

## 2017-05-19 NOTE — Progress Notes (Signed)
Subjective:        Rachel [name] is a 67 y.o. female here for a routine exam.  Current complaints: None.    Personal health questionnaire:  Is patient Ashkenazi Jewish, have a family history of breast and/or ovarian cancer: no Is there a family history of uterine cancer diagnosed at age < 86, gastrointestinal cancer, urinary tract cancer, family member who is a Field seismologist syndrome-associated carrier: no Is the patient overweight and hypertensive, family history of diabetes, personal history of gestational diabetes, preeclampsia or PCOS: no Is patient over 4, have PCOS,  family history of premature CHD under age 13, diabetes, smoke, have hypertension or peripheral artery disease:  no At any time, has a partner hit, kicked or otherwise hurt or frightened you?: no Over the past 2 weeks, have you felt down, depressed or hopeless?: no Over the past 2 weeks, have you felt little interest or pleasure in doing things?:no   Gynecologic History No LMP recorded. Patient is postmenopausal. Contraception: post menopausal status Last Pap: 2018 Results were: ASCUS with positive HPV Last mammogram: 2019. Results were: normal  Obstetric History OB History  Gravida Para Term Preterm AB Living  3         3  SAB TAB Ectopic Multiple Live Births          3    # Outcome Date GA Lbr Len/2nd Weight Sex Delivery Anes PTL Lv  3 Gravida 09/02/76    M    LIV  2 Gravida 02/12/74    M    LIV  1 Gravida 10/08/72    M   Y     Past Medical History:  Diagnosis Date  . Cancer ([place])   . Diverticulitis   . Leukemia ([place])   . Lower back pain     Past Surgical History:  Procedure Laterality Date  . BREAST SURGERY  2015   left breast bx  . BUNIONECTOMY     right foot  . lining of uterus removed    . OTHER SURGICAL HISTORY     removal of uterine lining  . TONSILLECTOMY       Current Outpatient Medications:  .  acetaminophen (TYLENOL) 500 MG tablet, Take 500 mg by mouth every 6 (six) hours as  needed for headache., Disp: , Rfl:  .  acyclovir (ZOVIRAX) 400 MG tablet, TAKE 1 TABLET TWICE A DAY, Disp: 180 tablet, Rfl: 3 .  allopurinol (ZYLOPRIM) 300 MG tablet, TAKE 1 TABLET DAILY, Disp: 90 tablet, Rfl: 3 .  Calcium Citrate (CITRACAL PO), Take 4 tablets by mouth daily. , Disp: , Rfl:  .  cholecalciferol (VITAMIN D) 1000 UNITS tablet, Take 4,000 Units by mouth daily., Disp: , Rfl:  .  ELESTRIN 0.52 MG/0.87 GM (0.06%) GEL, APPLY 2 PUMPS THINLY TO UPPER ARM DAILY AT BEDTIME, Disp: 156 g, Rfl: 2 .  hydrOXYzine (ATARAX/VISTARIL) 25 MG tablet, Take 1 tablet (25 mg total) 3 (three) times daily as needed by mouth., Disp: 30 tablet, Rfl: 0 .  ibrutinib (IMBRUVICA) 140 MG tablet, Take 3 tablets (420 mg total) daily by mouth., Disp: 270 tablet, Rfl: 6 .  lidocaine-prilocaine (EMLA) cream, Apply a nickel size amount to skin on top of port- cover with saran wrap at least 1 hour before access, Disp: 30 g, Rfl: 0 .  progesterone (PROMETRIUM) 200 MG capsule, TAKE 1 CAPSULE ON DAYS 1 THROUGH 12 EVERY OTHER MONTH, Disp: 24 capsule, Rfl: 1 .  traZODone (DESYREL) 50 MG tablet, Take  1 tablet (50 mg total) by mouth at bedtime., Disp: 90 tablet, Rfl: 4 .  venlafaxine XR (EFFEXOR-XR) 75 MG 24 hr capsule, Take 1 capsule (75 mg total) by mouth daily with breakfast., Disp: 90 capsule, Rfl: 4 Allergies  Allergen Reactions  . Penicillins Anaphylaxis    "stopped breathing" "drops my heartbeat" "I just pass out"    Social History   Tobacco Use  . Smoking status: Never Smoker  . Smokeless tobacco: Never Used  Substance Use Topics  . Alcohol use: Yes    Alcohol/week: 0.0 oz    Comment: socially    Family History  Problem Relation Age of Onset  . Depression Son       Review of Systems  Constitutional: negative for fatigue and weight loss Respiratory: negative for cough and wheezing Cardiovascular: negative for chest pain, fatigue and palpitations Gastrointestinal: negative for abdominal pain and change in  bowel habits Musculoskeletal:negative for myalgias Neurological: negative for gait problems and tremors Behavioral/Psych: negative for abusive relationship, depression Endocrine: negative for temperature intolerance    Genitourinary:negative for abnormal menstrual periods, genital lesions, hot flashes, sexual problems and vaginal discharge Integument/breast: negative for breast lump, breast tenderness, nipple discharge and skin lesion(s)    Objective:       BP 107/69   Pulse 73   Wt 119 lb 3.2 oz (54.1 kg)   BMI 24.91 kg/m  General:   alert  Skin:   no rash or abnormalities  Lungs:   clear to auscultation bilaterally  Heart:   regular rate and rhythm, S1, S2 normal, no murmur, click, rub or gallop  Breasts:   normal without suspicious masses, skin or nipple changes or axillary nodes  Abdomen:  normal findings: no organomegaly, soft, non-tender and no hernia  Pelvis:  External genitalia: normal general appearance Urinary system: urethral meatus normal and bladder without fullness, nontender Vaginal: normal without tenderness, induration or masses Cervix: normal appearance Adnexa: normal bimanual exam Uterus: anteverted and non-tender, normal size   Lab Review Urine pregnancy test Labs reviewed yes Radiologic studies reviewed yes  50% of 20 min visit spent on counseling and coordination of care.   Assessment:     1. Encounter for routine gynecological examination with Papanicolaou smear of cervix  2. ASCUS with negative high risk HPV cervical Rx: - Cytology - PAP   Plan:    Education reviewed: calcium supplements, depression evaluation, low fat, low cholesterol diet, self breast exams, skin cancer screening and weight bearing exercise. Follow up in: 2 years.   No orders of the defined types were placed in this encounter.  No orders of the defined types were placed in this encounter.   [name] MD 05-19-2017

## 2017-05-22 LAB — CYTOLOGY - PAP
DIAGNOSIS: NEGATIVE
HPV (WINDOPATH): DETECTED — AB

## 2017-05-28 DIAGNOSIS — F411 Generalized anxiety disorder: Secondary | ICD-10-CM | POA: Diagnosis not present

## 2017-06-11 ENCOUNTER — Telehealth: Payer: Self-pay | Admitting: Oncology

## 2017-06-11 DIAGNOSIS — J305 Allergic rhinitis due to food: Secondary | ICD-10-CM | POA: Diagnosis not present

## 2017-06-11 NOTE — Telephone Encounter (Signed)
Called pt to inform them of reschedule - spoke with patient regarding appts. Cancelled labe and fluish on 5/1 - pt wanted it all on one day.

## 2017-06-24 ENCOUNTER — Other Ambulatory Visit: Payer: Medicare Other

## 2017-06-26 ENCOUNTER — Encounter: Payer: Self-pay | Admitting: *Deleted

## 2017-06-30 ENCOUNTER — Telehealth: Payer: Self-pay

## 2017-06-30 NOTE — Telephone Encounter (Addendum)
Returned pt's call in regards to she needs a letter for her dentist faxed to their office for tomorrow to say that it has been cleared for her to have a dental cleaning.  No answer, left her message that I have the letter ready but I need her to return the call with the fax number or dentist office name so it can be sent there. Letter signed by Dr Jana Hakim and ready to send.   Pt called back and gave me the number for [facility].  I called their office, spoke with Angelica and got fax number :  [phone] and sent the letter requested.

## 2017-07-01 ENCOUNTER — Ambulatory Visit: Payer: Medicare Other | Admitting: Oncology

## 2017-07-01 NOTE — Telephone Encounter (Signed)
No entry 

## 2017-07-02 ENCOUNTER — Ambulatory Visit: Payer: Medicare Other | Admitting: Oncology

## 2017-07-15 DIAGNOSIS — R05 Cough: Secondary | ICD-10-CM | POA: Diagnosis not present

## 2017-07-15 DIAGNOSIS — J069 Acute upper respiratory infection, unspecified: Secondary | ICD-10-CM | POA: Diagnosis not present

## 2017-07-16 NOTE — Progress Notes (Signed)
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 She has been treated with Rituxan, ofatumumab, cladribine, bendamustine and currently with ibrutinib, with details summarized below.  INTERVAL HISTORY: Rachel [name] returns today for a follow-up and treatment of her chronic lymphocytic leukemia.   The patient continues on ibrutinib, which she tolerates well.   REVIEW OF SYSTEMS: Rachel [name] is having a lot of issues with allergies and has been given multiple medications. She reports some bruising to her body.  To saphenous mother is currently living with her and will be here another month or 2.  They are going to the gym together.. The patient denies unusual headaches, visual changes, nausea, vomiting, or dizziness. There has been no unusual cough, phlegm production, or pleurisy. This been no change in bowel or bladder habits. The patient denies unexplained fatigue or unexplained weight loss, bleeding, rash, or fever. A detailed review of systems was otherwise noncontributory.   PAST MEDICAL HISTORY: Past Medical History:  Diagnosis Date  . Cancer ([place])   . Diverticulitis   . Leukemia ([place])   . Lower back pain     PAST SURGICAL HISTORY: Past Surgical History:  Procedure Laterality Date  . BREAST SURGERY  2015   left breast bx  . BUNIONECTOMY     right foot  . lining of uterus removed    . OTHER SURGICAL HISTORY     removal of uterine lining  . TONSILLECTOMY      FAMILY HISTORY Family History  Problem Relation Age of Onset  . Depression Son     GYNECOLOGIC HISTORY: Menarche  age 69, first live birth age 46, she is [place] P3. She stopped having periods in 1999 after endometrial stripping. She continues on hormone replacement.  SOCIAL HISTORY: [name] is retired from [employer]. Her son [name] lives in [place] and has 3 sons; he works for a Pharmacist, community. A second son lives in [place] and has two children. Son [name] lives in [place] and has a daughter, as well as 2 sons by marriage.   ADVANCED DIRECTIVES: In place  HEALTH MAINTENANCE: Social History   Tobacco Use  . Smoking status: Never Smoker  . Smokeless tobacco: Never Used  Substance Use Topics  . Alcohol use: Yes    Alcohol/week: 0.0 oz    Comment: socially  . Drug use: No     Colonoscopy: due  PAP: December 2012/ [name]  Bone density: Due  Lipid panel: per Dr Drema Dallas  Allergies  Allergen Reactions  . Penicillins Anaphylaxis    "stopped breathing" "drops my heartbeat" "I just pass out"    Current Outpatient Medications  Medication Sig Dispense Refill  . acetaminophen (TYLENOL) 500 MG tablet Take 500 mg by mouth every 6 (six) hours as needed for headache.    Marland Kitchen acyclovir (ZOVIRAX) 400 MG tablet TAKE 1 TABLET TWICE A DAY 180 tablet 3  . allopurinol (ZYLOPRIM) 300 MG tablet TAKE 1 TABLET DAILY 90 tablet 3  . Calcium Citrate (CITRACAL PO) Take 4 tablets by mouth daily.     . cholecalciferol (VITAMIN D) 1000 UNITS tablet Take 4,000 Units by mouth daily.    Marland Kitchen  ELESTRIN 0.52 MG/0.87 GM (0.06%) GEL APPLY 2 PUMPS THINLY TO UPPER ARM DAILY AT BEDTIME 156 g 2  . hydrOXYzine (ATARAX/VISTARIL) 25 MG tablet Take 1 tablet (25 mg total) 3 (three) times daily as needed by mouth. 30 tablet 0  . ibrutinib (IMBRUVICA) 140 MG tablet Take 3 tablets (420 mg total) daily by mouth. 270 tablet 6  . lidocaine-prilocaine (EMLA) cream Apply a nickel size amount to skin on top of port- cover with saran wrap at least 1 hour before access 30 g 0  . progesterone (PROMETRIUM) 200 MG capsule TAKE 1 CAPSULE ON  DAYS 1 THROUGH 12 EVERY OTHER MONTH 24 capsule 1  . traZODone (DESYREL) 50 MG tablet Take 1 tablet (50 mg total) by mouth at bedtime. 90 tablet 4  . venlafaxine XR (EFFEXOR-XR) 75 MG 24 hr capsule Take 1 capsule (75 mg total) by mouth daily with breakfast. 90 capsule 4   No current facility-administered medications for this visit.     OBJECTIVE: Middle-aged white woman in no acute distress  Vitals:   07/17/17 1018  BP: 105/60  Pulse: 72  Resp: 18  Temp: 98.4 F (36.9 C)  SpO2: 100%   Filed Weights   07/17/17 1018  Weight: 116 lb 12.8 oz (53 kg)   Body mass index is 24.41 kg/m.    ECOG FS: 0  Sclerae unicteric, EOMs intact Oropharynx clear and moist No cervical or supraclavicular adenopathy Lungs no rales or rhonchi Heart regular rate and rhythm Abd soft, nontender, positive bowel sounds MSK no focal spinal tenderness, no upper extremity lymphedema Neuro: nonfocal, well oriented, appropriate affect Breasts: Deferred   LAB RESULTS:  Lab Results  Component Value Date   WBC 17.2 (H) 07/17/2017   NEUTROABS 3.8 07/17/2017   HGB 13.1 07/17/2017   HCT 40.3 07/17/2017   MCV 90.8 07/17/2017   PLT 112 (L) 07/17/2017      Chemistry      Component Value Date/Time   NA 141 07/17/2017 1003   NA 140 12/31/2016 1406   K 3.9 07/17/2017 1003   K 3.6 12/31/2016 1406   CL 106 07/17/2017 1003   CL 101 12/15/2011 1322   CO2 29 07/17/2017 1003   CO2 24 12/31/2016 1406   BUN 16 07/17/2017 1003   BUN 12.8 12/31/2016 1406   CREATININE 0.90 07/17/2017 1003   CREATININE 0.9 12/31/2016 1406      Component Value Date/Time   CALCIUM 8.7 07/17/2017 1003   CALCIUM 8.8 12/31/2016 1406   ALKPHOS 69 07/17/2017 1003   ALKPHOS 71 12/31/2016 1406   AST 18 07/17/2017 1003   AST 18 12/31/2016 1406   ALT 11 07/17/2017 1003   ALT 11 12/31/2016 1406   BILITOT 0.5 07/17/2017 1003   BILITOT 0.49 12/31/2016 1406     STUDIES: No results found.   ASSESSMENT: 67 y.o.  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 rigors 02/27/2014: received IV vancomycin  and levaquin briefly, then levaquin po (pcn allergy)  (6) bendamustine/ rituximab for 6 cycles, completed 05/04/2014, with improvement but not normalization of the absolute lymphocyte count  (7) ibrutinib started 05/08/2014, currently at 420mg  daily.   (a) hepatitis B studies March 06 2014-  PLAN: Rachel [name] is now 17 years out from initial diagnosis of chronic lymphoid leukemia.  She has been on ibrutinib for 3 years.  She is tolerating it remarkably well and aside from the slight problem with bleeding, the drug having an aspirin-like effect on platelets.  Her white cell count is lower.  She is not anemic.  She has no adenopathy.  She has no "B symptoms.  She has fairly reconciled with her husband who is helping her take care of Jo's mother.  They are planning trips to [place] in [place] with her grandchildren  She will return in 3 months for labs and in 6 months for labs and a visit.  She knows to call for any other issues that may develop before the next visit. Magrinat, Virgie Dad, MD  07/17/17 10:52 AM Medical Oncology and Hematology [facility] [address] [place], [place] [address] Tel. [phone]    Fax. [phone]  This document serves as a record of services  personally performed by Chauncey Cruel, MD. It was created on his behalf by Margit Banda, a trained medical scribe. The creation of this record is based on the scribe's personal observations and the provider's statements to them.  I have reviewed the above documentation for accuracy and completeness, and I agree with the above.

## 2017-07-17 ENCOUNTER — Inpatient Hospital Stay: Payer: Medicare Other

## 2017-07-17 ENCOUNTER — Inpatient Hospital Stay: Payer: Medicare Other | Attending: Oncology | Admitting: Oncology

## 2017-07-17 ENCOUNTER — Telehealth: Payer: Self-pay | Admitting: Oncology

## 2017-07-17 VITALS — BP 105/60 | HR 72 | Temp 98.4°F | Resp 18 | Ht <= 58 in | Wt 116.8 lb

## 2017-07-17 DIAGNOSIS — Z79899 Other long term (current) drug therapy: Secondary | ICD-10-CM | POA: Diagnosis not present

## 2017-07-17 DIAGNOSIS — Z9225 Personal history of immunosupression therapy: Secondary | ICD-10-CM

## 2017-07-17 DIAGNOSIS — C9110 Chronic lymphocytic leukemia of B-cell type not having achieved remission: Secondary | ICD-10-CM

## 2017-07-17 DIAGNOSIS — Z95828 Presence of other vascular implants and grafts: Secondary | ICD-10-CM

## 2017-07-17 DIAGNOSIS — Z452 Encounter for adjustment and management of vascular access device: Secondary | ICD-10-CM | POA: Insufficient documentation

## 2017-07-17 DIAGNOSIS — Z79890 Hormone replacement therapy (postmenopausal): Secondary | ICD-10-CM | POA: Diagnosis not present

## 2017-07-17 LAB — CBC WITH DIFFERENTIAL/PLATELET
BASOS ABS: 0.1 10*3/uL (ref 0.0–0.1)
Basophils Relative: 0 %
EOS PCT: 3 %
Eosinophils Absolute: 0.5 10*3/uL (ref 0.0–0.5)
HEMATOCRIT: 40.3 % (ref 34.8–46.6)
HEMOGLOBIN: 13.1 g/dL (ref 11.6–15.9)
LYMPHS ABS: 12.4 10*3/uL — AB (ref 0.9–3.3)
LYMPHS PCT: 72 %
MCH: 29.5 pg (ref 25.1–34.0)
MCHC: 32.5 g/dL (ref 31.5–36.0)
MCV: 90.8 fL (ref 79.5–101.0)
Monocytes Absolute: 0.6 10*3/uL (ref 0.1–0.9)
Monocytes Relative: 3 %
NEUTROS ABS: 3.8 10*3/uL (ref 1.5–6.5)
NEUTROS PCT: 22 %
Platelets: 112 10*3/uL — ABNORMAL LOW (ref 145–400)
RBC: 4.44 MIL/uL (ref 3.70–5.45)
RDW: 13.2 % (ref 11.2–14.5)
WBC: 17.2 10*3/uL — AB (ref 3.9–10.3)

## 2017-07-17 LAB — COMPREHENSIVE METABOLIC PANEL
ALT: 11 U/L (ref 0–55)
ANION GAP: 6 (ref 3–11)
AST: 18 U/L (ref 5–34)
Albumin: 3.8 g/dL (ref 3.5–5.0)
Alkaline Phosphatase: 69 U/L (ref 40–150)
BILIRUBIN TOTAL: 0.5 mg/dL (ref 0.2–1.2)
BUN: 16 mg/dL (ref 7–26)
CHLORIDE: 106 mmol/L (ref 98–109)
CO2: 29 mmol/L (ref 22–29)
Calcium: 8.7 mg/dL (ref 8.4–10.4)
Creatinine, Ser: 0.9 mg/dL (ref 0.60–1.10)
Glucose, Bld: 74 mg/dL (ref 70–140)
Potassium: 3.9 mmol/L (ref 3.5–5.1)
Sodium: 141 mmol/L (ref 136–145)
TOTAL PROTEIN: 5.9 g/dL — AB (ref 6.4–8.3)

## 2017-07-17 LAB — LACTATE DEHYDROGENASE: LDH: 204 U/L (ref 125–245)

## 2017-07-17 MED ORDER — HEPARIN SOD (PORK) LOCK FLUSH 100 UNIT/ML IV SOLN
500.0000 [IU] | Freq: Once | INTRAVENOUS | Status: AC
  Administered 2017-07-17: 500 [IU]
  Filled 2017-07-17: qty 5

## 2017-07-17 MED ORDER — SODIUM CHLORIDE 0.9% FLUSH
10.0000 mL | Freq: Once | INTRAVENOUS | Status: AC
  Administered 2017-07-17: 10 mL
  Filled 2017-07-17: qty 10

## 2017-07-17 NOTE — Telephone Encounter (Signed)
Gave patient AVs and calendar of upcoming appointments per 5/24 los.

## 2017-07-18 LAB — BETA 2 MICROGLOBULIN, SERUM: Beta-2 Microglobulin: 3.5 mg/L — ABNORMAL HIGH (ref 0.6–2.4)

## 2017-07-28 DIAGNOSIS — K219 Gastro-esophageal reflux disease without esophagitis: Secondary | ICD-10-CM | POA: Diagnosis not present

## 2017-07-28 DIAGNOSIS — R0982 Postnasal drip: Secondary | ICD-10-CM | POA: Diagnosis not present

## 2017-08-03 ENCOUNTER — Telehealth: Payer: Self-pay

## 2017-08-03 NOTE — Telephone Encounter (Signed)
Pt has called requesting refill be sent to [facility] for trazadone and progesterone. Pt states she only has one dose left. Please send if approved

## 2017-08-04 ENCOUNTER — Other Ambulatory Visit: Payer: Self-pay | Admitting: Obstetrics

## 2017-08-04 DIAGNOSIS — Z79890 Hormone replacement therapy (postmenopausal): Secondary | ICD-10-CM

## 2017-08-04 MED ORDER — ESTRADIOL 0.52 MG/0.87 GM (0.06%) TD GEL: TRANSDERMAL | 3 refills | Status: DC

## 2017-08-04 MED ORDER — PROGESTERONE MICRONIZED 200 MG PO CAPS: ORAL_CAPSULE | ORAL | 0 refills | Status: DC

## 2017-08-04 MED ORDER — PROGESTERONE MICRONIZED 200 MG PO CAPS: ORAL_CAPSULE | ORAL | 3 refills | Status: DC

## 2017-08-10 ENCOUNTER — Other Ambulatory Visit: Payer: Self-pay

## 2017-08-10 MED ORDER — CIPROFLOXACIN HCL 500 MG PO TABS: 500.0000 mg | ORAL_TABLET | Freq: Two times a day (BID) | ORAL | 0 refills | Status: DC

## 2017-08-28 DIAGNOSIS — A09 Infectious gastroenteritis and colitis, unspecified: Secondary | ICD-10-CM | POA: Diagnosis not present

## 2017-08-28 DIAGNOSIS — R1030 Lower abdominal pain, unspecified: Secondary | ICD-10-CM | POA: Diagnosis not present

## 2017-08-28 DIAGNOSIS — K5792 Diverticulitis of intestine, part unspecified, without perforation or abscess without bleeding: Secondary | ICD-10-CM | POA: Diagnosis not present

## 2017-08-31 ENCOUNTER — Other Ambulatory Visit: Payer: Self-pay | Admitting: *Deleted

## 2017-08-31 ENCOUNTER — Telehealth: Payer: Self-pay | Admitting: *Deleted

## 2017-08-31 NOTE — Telephone Encounter (Signed)
This RN spoke with pt per her call - Rachel [name] states she has returned from a trip to [place] - " where I got very sick and they told me I have diverticulitis and put me on antibiotics "  She held the [place] per above.  Upon return home - she states " I feel terrible "  She is having continued loose stools, chills, and fatigue.  Cynethia's concern is the above incidence - and she is off the [place] and she has a plan trip to [place] next week.  She is inquiring if she should be seen prior to leaving for [place].  This RN scheduled an appointment with MD per above discussion with lab prior to visit.

## 2017-08-31 NOTE — Progress Notes (Signed)
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 She has been treated with Rituxan, ofatumumab, cladribine, bendamustine and currently with ibrutinib, with details summarized below.  INTERVAL HISTORY: Rachel [name] returns today for follow-up and treatment of her chronic lymphocytic leukemia. She is accompanied by her husband. She took a trip to [place] and got very sick, they told her she had diverticulitis, and she was started on antibiotics.  She held the ibrutinib but still feels "terrible", still has loose stools and chills and fatigue and because she is planning to go to [place] (09/08/2017 - 09/30/2017) she wanted to make sure to see [place] before leaving the country again.    The patient stopped ibrutinib on 08/18/2017. When she first started she experienced dry mouth, but it resolved.   REVIEW OF SYSTEMS: Rachel [name] is doing better. When she was in [place] she endorsed diarrhea and took imodium. She also experienced vertigo. Her loose stool has been slowly improving. One night in [place] she endorsed chills, but never took her temperature. The following night she started to have similar symptoms but she took ibuprofen and it helped. She hasn't had a fever since. The patient denies unusual headaches, visual changes, nausea, vomiting, or dizziness. There has been no unusual cough, phlegm production, or pleurisy. This been no change in bladder habits. The patient denies unexplained fatigue or unexplained weight loss, bleeding,  or rash. A detailed review of systems was otherwise noncontributory.     PAST MEDICAL HISTORY: Past Medical History:  Diagnosis Date  . Cancer ([place])   . Diverticulitis   . Leukemia ([place])   . Lower back pain     PAST SURGICAL HISTORY: Past Surgical History:  Procedure Laterality Date  . BREAST SURGERY  2015   left breast bx  . BUNIONECTOMY     right foot  . lining of uterus removed    . OTHER SURGICAL HISTORY     removal of uterine lining  . TONSILLECTOMY      FAMILY HISTORY Family History  Problem Relation Age of Onset  . Depression Son     GYNECOLOGIC HISTORY: Menarche age 79, first live birth age 75, she is [place] P3. She stopped having periods in 1999 after endometrial stripping. She continues on hormone replacement.  SOCIAL HISTORY: [name] is retired from [employer]. Her son [name] lives in [place] and has 3 sons; he works for a Pharmacist, community. A second son lives in [place] and has two children. Son [name] lives in [place] and has a daughter, as well as 2 sons by marriage.   ADVANCED DIRECTIVES: In place  HEALTH MAINTENANCE: Social History   Tobacco Use  . Smoking status: Never Smoker  . Smokeless tobacco: Never Used  Substance Use Topics  . Alcohol use: Yes    Alcohol/week: 0.0 oz    Comment: socially  . Drug use: No     Colonoscopy: due  PAP: December 2012/ [name]  Bone density: Due  Lipid panel: per Dr Drema Dallas  Allergies  Allergen Reactions  . Penicillins Anaphylaxis    "stopped breathing" "drops  my heartbeat" "I just pass out"    Current Outpatient Medications  Medication Sig Dispense Refill  . acetaminophen (TYLENOL) 500 MG tablet Take 500 mg by mouth every 6 (six) hours as needed for headache.    Marland Kitchen acyclovir (ZOVIRAX) 400 MG tablet TAKE 1 TABLET TWICE A DAY 180 tablet 3  . allopurinol (ZYLOPRIM) 300 MG tablet TAKE 1 TABLET DAILY 90 tablet 3  . Calcium Citrate (CITRACAL PO) Take 4 tablets by mouth daily.     .  cholecalciferol (VITAMIN D) 1000 UNITS tablet Take 4,000 Units by mouth daily.    . ciprofloxacin (CIPRO) 500 MG tablet Take 1 tablet (500 mg total) by mouth 2 (two) times daily. 10 tablet 0  . Estradiol (ELESTRIN) 0.52 MG/0.87 GM (0.06%) GEL APPLY 2 PUMPS THINLY TO UPPER ARM DAILY AT BEDTIME 6 Bottle 3  . fluticasone (FLONASE) 50 MCG/ACT nasal spray Place into both nostrils daily.  2  . hydrOXYzine (ATARAX/VISTARIL) 25 MG tablet Take 1 tablet (25 mg total) 3 (three) times daily as needed by mouth. 30 tablet 0  . ibrutinib (IMBRUVICA) 140 MG tablet Take 3 tablets (420 mg total) daily by mouth. 270 tablet 6  . lidocaine-prilocaine (EMLA) cream Apply a nickel size amount to skin on top of port- cover with saran wrap at least 1 hour before access 30 g 0  . progesterone (PROMETRIUM) 200 MG capsule TAKE 1 CAPSULE ON DAYS 1 THROUGH 12,  MONTHLY 36 capsule 0  . traZODone (DESYREL) 50 MG tablet Take 1 tablet (50 mg total) by mouth at bedtime. 90 tablet 4  . venlafaxine XR (EFFEXOR-XR) 75 MG 24 hr capsule Take 1 capsule (75 mg total) by mouth daily with breakfast. 90 capsule 4   No current facility-administered medications for this visit.     OBJECTIVE: Middle-aged white woman appears stated age  12:   09/01/17 1120  BP: (!) 105/51  Pulse: 98  Resp: 18  Temp: 98.1 F (36.7 C)  SpO2: 100%   Filed Weights   09/01/17 1120  Weight: 115 lb 8 oz (52.4 kg)   Body mass index is 24.14 kg/m.    ECOG FS: 1  Sclerae unicteric, pupils round and equal Oropharynx clear and moist No cervical or supraclavicular adenopathy, no axillary or inguinal adenopathy Lungs no rales or rhonchi Heart regular rate and rhythm Abd soft, nontender, positive bowel sounds MSK no focal spinal tenderness, no upper extremity lymphedema Neuro: nonfocal, well oriented, appropriate affect Breasts: Deferred    LAB RESULTS:  Lab Results  Component Value Date   WBC 4.0 09/01/2017   NEUTROABS 1.7 09/01/2017   HGB  13.0 09/01/2017   HCT 39.1 09/01/2017   MCV 88.3 09/01/2017   PLT 119 (L) 09/01/2017      Chemistry      Component Value Date/Time   NA 140 09/01/2017 1112   NA 140 12/31/2016 1406   K 3.8 09/01/2017 1112   K 3.6 12/31/2016 1406   CL 106 09/01/2017 1112   CL 101 12/15/2011 1322   CO2 26 09/01/2017 1112   CO2 24 12/31/2016 1406   BUN 10 09/01/2017 1112   BUN 12.8 12/31/2016 1406   CREATININE 1.11 (H) 09/01/2017 1112   CREATININE 0.9 12/31/2016 1406      Component Value Date/Time   CALCIUM 9.3 09/01/2017 1112   CALCIUM 8.8 12/31/2016 1406   ALKPHOS 100 09/01/2017 1112   ALKPHOS 71 12/31/2016 1406   AST 28 09/01/2017 1112   AST 18 12/31/2016 1406  ALT 19 09/01/2017 1112   ALT 11 12/31/2016 1406   BILITOT 0.5 09/01/2017 1112   BILITOT 0.49 12/31/2016 1406     STUDIES: No results found.   ASSESSMENT: 67 y.o.  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 rigors 02/27/2014: received IV vancomycin and levaquin briefly, then levaquin po (pcn allergy)  (6) bendamustine/ rituximab for 6 cycles, completed 05/04/2014, with improvement but not normalization of the absolute lymphocyte count  (7) ibrutinib started 05/08/2014, currently at 420mg  daily.   (a) hepatitis B studies March 06 2014-  PLAN: Rachel [name] is now over 17 years out from initial  diagnosis of chronic lymphoid leukemia.  She is currently in remission, with no palpable adenopathy and normal white cell count.  She tolerates the ibrutinib with no side effects and the plan is to continue that indefinitely.  Her diarrhea may be due to diverticulosis or giardiasis or other infectious cause.  The Flagyl and Cipro should take care of most of those possibilities.  One thing that they would not take care of is C. difficile and I will send a test today to make sure that that is not what is going on at this point.  Otherwise I think it is safe for her to travel to [place] as she is planning to.  She already has an appointment to return here late August for labs and then labs and a visit in November.  She knows to call for any problems that may develop before the next visit here. Raquelle Pietro, Virgie Dad, MD  09/01/17 11:59 AM Medical Oncology and Hematology [facility] [address] [place], [place] [address] Tel. [phone]    Fax. [phone]  I, Margit Banda am acting as a scribe for Chauncey Cruel, MD.   I, Lurline Del MD, have reviewed the above documentation for accuracy and completeness, and I agree with the above.

## 2017-09-01 ENCOUNTER — Inpatient Hospital Stay: Payer: Medicare Other | Attending: Oncology | Admitting: Oncology

## 2017-09-01 ENCOUNTER — Inpatient Hospital Stay: Payer: Medicare Other

## 2017-09-01 VITALS — BP 105/51 | HR 98 | Temp 98.1°F | Resp 18 | Ht <= 58 in | Wt 115.5 lb

## 2017-09-01 DIAGNOSIS — K5790 Diverticulosis of intestine, part unspecified, without perforation or abscess without bleeding: Secondary | ICD-10-CM | POA: Diagnosis not present

## 2017-09-01 DIAGNOSIS — C9110 Chronic lymphocytic leukemia of B-cell type not having achieved remission: Secondary | ICD-10-CM | POA: Diagnosis not present

## 2017-09-01 DIAGNOSIS — C9190 Lymphoid leukemia, unspecified not having achieved remission: Secondary | ICD-10-CM

## 2017-09-01 DIAGNOSIS — R197 Diarrhea, unspecified: Secondary | ICD-10-CM | POA: Insufficient documentation

## 2017-09-01 DIAGNOSIS — Z79899 Other long term (current) drug therapy: Secondary | ICD-10-CM | POA: Insufficient documentation

## 2017-09-01 DIAGNOSIS — Z9225 Personal history of immunosupression therapy: Secondary | ICD-10-CM | POA: Insufficient documentation

## 2017-09-01 DIAGNOSIS — R6883 Chills (without fever): Secondary | ICD-10-CM | POA: Insufficient documentation

## 2017-09-01 DIAGNOSIS — R5381 Other malaise: Secondary | ICD-10-CM | POA: Insufficient documentation

## 2017-09-01 DIAGNOSIS — Z9221 Personal history of antineoplastic chemotherapy: Secondary | ICD-10-CM | POA: Insufficient documentation

## 2017-09-01 DIAGNOSIS — R5383 Other fatigue: Secondary | ICD-10-CM | POA: Insufficient documentation

## 2017-09-01 LAB — CBC WITH DIFFERENTIAL/PLATELET
BASOS ABS: 0 10*3/uL (ref 0.0–0.1)
Basophils Relative: 1 %
EOS PCT: 2 %
Eosinophils Absolute: 0.1 10*3/uL (ref 0.0–0.5)
HEMATOCRIT: 39.1 % (ref 34.8–46.6)
Hemoglobin: 13 g/dL (ref 11.6–15.9)
LYMPHS PCT: 46 %
Lymphs Abs: 1.9 10*3/uL (ref 0.9–3.3)
MCH: 29.3 pg (ref 25.1–34.0)
MCHC: 33.2 g/dL (ref 31.5–36.0)
MCV: 88.3 fL (ref 79.5–101.0)
Monocytes Absolute: 0.3 10*3/uL (ref 0.1–0.9)
Monocytes Relative: 8 %
NEUTROS ABS: 1.7 10*3/uL (ref 1.5–6.5)
Neutrophils Relative %: 43 %
Platelets: 119 10*3/uL — ABNORMAL LOW (ref 145–400)
RBC: 4.43 MIL/uL (ref 3.70–5.45)
RDW: 13.2 % (ref 11.2–14.5)
WBC: 4 10*3/uL (ref 3.9–10.3)

## 2017-09-01 LAB — COMPREHENSIVE METABOLIC PANEL
ALK PHOS: 100 U/L (ref 38–126)
ALT: 19 U/L (ref 0–44)
AST: 28 U/L (ref 15–41)
Albumin: 3.6 g/dL (ref 3.5–5.0)
Anion gap: 8 (ref 5–15)
BUN: 10 mg/dL (ref 8–23)
CALCIUM: 9.3 mg/dL (ref 8.9–10.3)
CHLORIDE: 106 mmol/L (ref 98–111)
CO2: 26 mmol/L (ref 22–32)
CREATININE: 1.11 mg/dL — AB (ref 0.44–1.00)
GFR calc non Af Amer: 50 mL/min — ABNORMAL LOW (ref >60)
GFR, EST AFRICAN AMERICAN: 58 mL/min — AB (ref >60)
Glucose, Bld: 114 mg/dL — ABNORMAL HIGH (ref 70–99)
Potassium: 3.8 mmol/L (ref 3.5–5.1)
SODIUM: 140 mmol/L (ref 135–145)
Total Bilirubin: 0.5 mg/dL (ref 0.3–1.2)
Total Protein: 6.3 g/dL — ABNORMAL LOW (ref 6.5–8.1)

## 2017-09-01 LAB — LACTATE DEHYDROGENASE: LDH: 294 U/L — ABNORMAL HIGH (ref 98–192)

## 2017-09-02 LAB — BETA 2 MICROGLOBULIN, SERUM: BETA 2 MICROGLOBULIN: 5.2 mg/L — AB (ref 0.6–2.4)

## 2017-10-02 ENCOUNTER — Ambulatory Visit
Admission: RE | Admit: 2017-10-02 | Discharge: 2017-10-02 | Disposition: A | Discharge status: Home / Self Care | Payer: Medicare Other | Source: Ambulatory Visit | Attending: Internal Medicine | Admitting: Internal Medicine

## 2017-10-02 ENCOUNTER — Other Ambulatory Visit: Payer: Self-pay

## 2017-10-02 ENCOUNTER — Other Ambulatory Visit: Payer: Self-pay | Admitting: Internal Medicine

## 2017-10-02 ENCOUNTER — Inpatient Hospital Stay: Payer: Medicare Other | Attending: Oncology

## 2017-10-02 DIAGNOSIS — J45909 Unspecified asthma, uncomplicated: Secondary | ICD-10-CM | POA: Diagnosis not present

## 2017-10-02 DIAGNOSIS — R05 Cough: Secondary | ICD-10-CM | POA: Diagnosis not present

## 2017-10-02 DIAGNOSIS — C9110 Chronic lymphocytic leukemia of B-cell type not having achieved remission: Secondary | ICD-10-CM

## 2017-10-02 DIAGNOSIS — F3341 Major depressive disorder, recurrent, in partial remission: Secondary | ICD-10-CM | POA: Diagnosis not present

## 2017-10-02 DIAGNOSIS — Z79899 Other long term (current) drug therapy: Secondary | ICD-10-CM | POA: Diagnosis not present

## 2017-10-02 DIAGNOSIS — Z9225 Personal history of immunosupression therapy: Secondary | ICD-10-CM | POA: Diagnosis not present

## 2017-10-02 DIAGNOSIS — R21 Rash and other nonspecific skin eruption: Secondary | ICD-10-CM | POA: Insufficient documentation

## 2017-10-02 LAB — CMP (CANCER CENTER ONLY)
ALBUMIN: 3.7 g/dL (ref 3.5–5.0)
ALT: 13 U/L (ref 0–44)
AST: 19 U/L (ref 15–41)
Alkaline Phosphatase: 86 U/L (ref 38–126)
Anion gap: 13 (ref 5–15)
BUN: 13 mg/dL (ref 8–23)
CHLORIDE: 103 mmol/L (ref 98–111)
CO2: 23 mmol/L (ref 22–32)
Calcium: 9.9 mg/dL (ref 8.9–10.3)
Creatinine: 1.27 mg/dL — ABNORMAL HIGH (ref 0.44–1.00)
GFR, EST AFRICAN AMERICAN: 49 mL/min — AB (ref >60)
GFR, Estimated: 42 mL/min — ABNORMAL LOW (ref >60)
GLUCOSE: 200 mg/dL — AB (ref 70–99)
POTASSIUM: 3.4 mmol/L — AB (ref 3.5–5.1)
SODIUM: 139 mmol/L (ref 135–145)
Total Bilirubin: 0.6 mg/dL (ref 0.3–1.2)
Total Protein: 6.1 g/dL — ABNORMAL LOW (ref 6.5–8.1)

## 2017-10-02 LAB — CBC WITH DIFFERENTIAL (CANCER CENTER ONLY)
BASOS ABS: 0.2 10*3/uL — AB (ref 0.0–0.1)
Basophils Relative: 3 %
EOS ABS: 0.2 10*3/uL (ref 0.0–0.5)
Eosinophils Relative: 3 %
HCT: 37.2 % (ref 34.8–46.6)
Hemoglobin: 12.3 g/dL (ref 11.6–15.9)
LYMPHS ABS: 3 10*3/uL (ref 0.9–3.3)
LYMPHS PCT: 52 %
MCH: 29.1 pg (ref 25.1–34.0)
MCHC: 33.1 g/dL (ref 31.5–36.0)
MCV: 88 fL (ref 79.5–101.0)
Monocytes Absolute: 0.4 10*3/uL (ref 0.1–0.9)
Monocytes Relative: 8 %
NEUTROS ABS: 1.9 10*3/uL (ref 1.5–6.5)
Neutrophils Relative %: 34 %
Platelet Count: 97 10*3/uL — ABNORMAL LOW (ref 145–400)
RBC: 4.23 MIL/uL (ref 3.70–5.45)
RDW: 13.6 % (ref 11.2–14.5)
WBC Count: 5.7 10*3/uL (ref 3.9–10.3)

## 2017-10-02 LAB — LACTATE DEHYDROGENASE: LDH: 236 U/L — AB (ref 98–192)

## 2017-10-03 LAB — BETA 2 MICROGLOBULIN, SERUM: Beta-2 Microglobulin: 6.1 mg/L — ABNORMAL HIGH (ref 0.6–2.4)

## 2017-10-05 ENCOUNTER — Other Ambulatory Visit: Payer: Self-pay | Admitting: Oncology

## 2017-10-05 DIAGNOSIS — C8300 Small cell B-cell lymphoma, unspecified site: Secondary | ICD-10-CM | POA: Insufficient documentation

## 2017-10-05 NOTE — Progress Notes (Unsigned)
Counts OK except for lower platelets and higher beta-2. Not clear how intercurrent diarrhea may have affected these. Will schedule a PET before NOV visit if we can get it paid for.

## 2017-10-06 DIAGNOSIS — J45909 Unspecified asthma, uncomplicated: Secondary | ICD-10-CM | POA: Diagnosis not present

## 2017-10-06 DIAGNOSIS — D630 Anemia in neoplastic disease: Secondary | ICD-10-CM | POA: Diagnosis not present

## 2017-10-06 DIAGNOSIS — F3341 Major depressive disorder, recurrent, in partial remission: Secondary | ICD-10-CM | POA: Diagnosis not present

## 2017-10-07 ENCOUNTER — Telehealth: Payer: Self-pay

## 2017-10-07 NOTE — Telephone Encounter (Signed)
Pt called to report that she had been breaking out in bumps and dry blisters over the last few days. They are non draining, but very itchy. She recently came back from [place] a week ago. She had done some excursion and have developed bug bites or bumps during her vacation, but had gotten better. She had noticed bumps popping up in her arms and new ones are present everyday. She is starting to get worried. She was recently at the cancer center to have lab drawns and were all ok. Pt was on Imbruvica but had stopped taking since 6/25 d/t diverticulitis. Pt has not been back on imbruvica yet. Denies fevers, chills, diarrhea,drainage from blisters/bumps. Pt advised to take benadryl or she can take her atarax (just not together) to help with itching. Pt to be set up with symptom management tomorrow at 10am. Dr.Magrinat is aware and agrees with having symptom management evaluate her. No further labs at this time until seen by Van,PA. Pt confirmed appt tomorrow and verbalized understanding.

## 2017-10-08 ENCOUNTER — Inpatient Hospital Stay (HOSPITAL_BASED_OUTPATIENT_CLINIC_OR_DEPARTMENT_OTHER): Payer: Medicare Other | Admitting: Medical

## 2017-10-08 DIAGNOSIS — Z9225 Personal history of immunosupression therapy: Secondary | ICD-10-CM | POA: Diagnosis not present

## 2017-10-08 DIAGNOSIS — Z79899 Other long term (current) drug therapy: Secondary | ICD-10-CM

## 2017-10-08 DIAGNOSIS — R21 Rash and other nonspecific skin eruption: Secondary | ICD-10-CM | POA: Diagnosis not present

## 2017-10-08 DIAGNOSIS — C9110 Chronic lymphocytic leukemia of B-cell type not having achieved remission: Secondary | ICD-10-CM | POA: Diagnosis not present

## 2017-10-08 DIAGNOSIS — W57XXXA Bitten or stung by nonvenomous insect and other nonvenomous arthropods, initial encounter: Secondary | ICD-10-CM

## 2017-10-08 MED ORDER — PERMETHRIN 1 % EX LOTN: TOPICAL_LOTION | CUTANEOUS | 0 refills | Status: DC

## 2017-10-08 MED ORDER — TRIAMCINOLONE ACETONIDE 0.1 % EX LOTN: 1.0000 "application " | TOPICAL_LOTION | Freq: Four times a day (QID) | CUTANEOUS | 1 refills | Status: DC

## 2017-10-08 NOTE — Patient Instructions (Signed)
Insect Bite, Adult An insect bite can make your skin red, itchy, and swollen. Some insects can spread disease to people with a bite. However, most insect bites do not lead to disease, and most are not serious. Follow these instructions at home: Bite area care  Do not scratch the bite area.  Keep the bite area clean and dry.  Wash the bite area every day with soap and water as told by your doctor.  Check the bite area every day for signs of infection. Check for: ? More redness, swelling, or pain. ? Fluid or blood. ? Warmth. ? Pus. Managing pain, itching, and swelling  You may put any of these on the bite area as told by your doctor: ? A baking soda paste. ? Cortisone cream. ? Calamine lotion.  If directed, put ice on the bite area. ? Put ice in a plastic bag. ? Place a towel between your skin and the bag. ? Leave the ice on for 20 minutes, 2-3 times a day. Medicines  Take medicines or put medicines on your skin only as told by your doctor.  If you were prescribed an antibiotic medicine, use it as told by your doctor. Do not stop using the antibiotic even if your condition improves. General instructions  Keep all follow-up visits as told by your doctor. This is important. How is this prevented? To help you have a lower risk of insect bites:  When you are outside, wear clothing that covers your arms and legs.  Use insect repellent. The best insect repellents have: ? An active ingredient of DEET, picaridin, oil of lemon eucalyptus (OLE), or IR3535. ? Higher amounts of DEET or another active ingredient than other repellents have.  If your home windows do not have screens, think about putting some in.  Contact a doctor if:  You have more redness, swelling, or pain in the bite area.  You have fluid, blood, or pus coming from the bite area.  The bite area feels warm.  You have a fever. Get help right away if:  You have joint pain.  You have a rash.  You have  shortness of breath.  You feel more tired or sleepy than you normally do.  You have neck pain.  You have a headache.  You feel weaker than you normally do.  You have chest pain.  You have pain in your belly.  You feel sick to your stomach (nauseous) or you throw up (vomit). Summary  An insect bite can make your skin red, itchy, and swollen.  Do not scratch the bite area, and keep it clean and dry.  Ice can help with pain and itching from the bite. This information is not intended to replace advice given to you by your health care provider. Make sure you discuss any questions you have with your health care provider. Document Released: 02/08/2000 Document Revised: 09/13/2015 Document Reviewed: 06/28/2014 Elsevier Interactive Patient Education  2018 Elsevier Inc.  

## 2017-10-08 NOTE — Progress Notes (Signed)
Pt presents with reported insect bites after a recent trip to [place].  Pt states that she was bitten while out of the country and continues to find bites after she has returned.  The spots are spread across her feet/lower legs, hands, arms, and neck.  She reports itching and sensitivity at sites but denies bleeding or drainage.  Afebrile.  Denies trouble breathing or swallowing.

## 2017-10-09 ENCOUNTER — Telehealth: Payer: Self-pay | Admitting: Emergency Medicine

## 2017-10-09 NOTE — Telephone Encounter (Signed)
Pt reports "the medication that the doctor gave me isn't working, I'm still finding new bites this morning".  Pt requests advice for what she should do.  PA Lucianne Lei to call pt back with advisement.

## 2017-10-09 NOTE — Progress Notes (Signed)
[facility] Progress Note   Rachel [name] [id number] [birth year] 67 y.o.  [name] is managed by Dr. Jana Hakim  Actively treated with chemotherapy/immunotherapy: yes  Current Therapy: IMBRUVICA  Assessment: Plan:    Insect bite, unspecified site, initial encounter - Plan: permethrin (ELIMITE) 1 % lotion, triamcinolone lotion (KENALOG) 0.1 %  CLL (chronic lymphocytic leukemia) ([place])   Insect bites: The patient was given a prescription for permethrin lotion and triamcinolone lotion.  CLL: Mrs. Brecheisen continues on Imbruvica under the care of Dr. Jana Hakim.  Please see After Visit Summary for patient specific instructions.  Future Appointments  Date Time Provider [place]  10/16/2017  1:30 PM [facility] LAB 3 [facility] None  10/16/2017  1:45 PM [facility] [place] FLUSH [facility] None  11/03/2017  8:30 AM Bobbitt, Sedalia Muta, MD [facility] None  01/14/2018  1:15 PM [facility] LAB 5 [facility] None  01/14/2018  1:30 PM [facility] INJ NURSE [facility] None  01/14/2018  2:00 PM Magrinat, Virgie Dad, MD [facility] None    No orders of the defined types were placed in this encounter.      Subjective:   Patient ID:  Rachel [name] is a 67 y.o. (DOB 18-Feb-1951) female.  Chief Complaint:  Chief Complaint  Patient presents with  . Rash    HPI Rachel [name] is a 67 year old female with a diagnosis of CLL who is managed by Dr. Jana Hakim and is treated with Imbruvica. She recently returned from [place] and and has noted suspected insect bites across her feet/lower legs, hands, arms, and neck.  She reports itching and sensitivity at sites but denies bleeding or drainage.  She denies fever, chills, sweats, or SOB.   Medications: I have reviewed the patient's current medications.  Allergies:  Allergies  Allergen Reactions  . Penicillins Anaphylaxis    "stopped breathing" "drops my heartbeat" "I just pass out"    Past Medical  History:  Diagnosis Date  . Cancer ([place])   . Diverticulitis   . Leukemia ([place])   . Lower back pain     Past Surgical History:  Procedure Laterality Date  . BREAST SURGERY  2015   left breast bx  . BUNIONECTOMY     right foot  . lining of uterus removed    . OTHER SURGICAL HISTORY     removal of uterine lining  . TONSILLECTOMY      Family History  Problem Relation Age of Onset  . Depression Son     Social History   Socioeconomic History  . Marital status: Married    Spouse name: Not on file  . Number of children: Not on file  . Years of education: Not on file  . Highest education level: Not on file  Occupational History  . Not on file  Social Needs  . Financial resource strain: Not on file  . Food insecurity:    Worry: Not on file    Inability: Not on file  . Transportation needs:    Medical: Not on file    Non-medical: Not on file  Tobacco Use  . Smoking status: Never Smoker  . Smokeless tobacco: Never Used  Substance and Sexual Activity  . Alcohol use: Yes    Alcohol/week: 0.0 standard drinks    Comment: socially  . Drug use: No  . Sexual activity: Not Currently    Partners: Male    Birth control/protection: None  Lifestyle  . Physical activity:    Days per week: Not on file  Minutes per session: Not on file  . Stress: Not on file  Relationships  . Social connections:    Talks on phone: Not on file    Gets together: Not on file    Attends religious service: Not on file    Active member of club or organization: Not on file    Attends meetings of clubs or organizations: Not on file    Relationship status: Not on file  . Intimate partner violence:    Fear of current or ex partner: Not on file    Emotionally abused: Not on file    Physically abused: Not on file    Forced sexual activity: Not on file  Other Topics Concern  . Not on file  Social History Narrative  . Not on file    Past Medical History, Surgical history, Social history, and  Family history were reviewed and updated as appropriate.   Please see review of systems for further details on the patient's review from today.   Review of Systems:  Review of Systems  Constitutional: Negative for chills, diaphoresis and fever.  Respiratory: Negative for shortness of breath.   Skin: Positive for rash.    Objective:   Physical Exam:  There were no vitals taken for this visit. ECOG: 0  Physical Exam  Constitutional: No distress.  HENT:  Head: Normocephalic and atraumatic.  Cardiovascular: Normal rate, regular rhythm and normal heart sounds. Exam reveals no friction rub.  No murmur heard. Pulmonary/Chest: Effort normal and breath sounds normal. No stridor. No respiratory distress. She has no wheezes. She has no rales.  Skin: Skin is warm and dry. Rash noted. She is not diaphoretic. There is erythema.  The patient has multiple areas of erythema with central lesions consistent with insect bites over her ankles, wrists, forearms, and posterior neck.    Lab Review:     Component Value Date/Time   NA 139 10/02/2017 1502   NA 140 12/31/2016 1406   K 3.4 (L) 10/02/2017 1502   K 3.6 12/31/2016 1406   CL 103 10/02/2017 1502   CL 101 12/15/2011 1322   CO2 23 10/02/2017 1502   CO2 24 12/31/2016 1406   GLUCOSE 200 (H) 10/02/2017 1502   GLUCOSE 125 12/31/2016 1406   GLUCOSE 83 12/15/2011 1322   BUN 13 10/02/2017 1502   BUN 12.8 12/31/2016 1406   CREATININE 1.27 (H) 10/02/2017 1502   CREATININE 0.9 12/31/2016 1406   CALCIUM 9.9 10/02/2017 1502   CALCIUM 8.8 12/31/2016 1406   PROT 6.1 (L) 10/02/2017 1502   PROT 6.0 (L) 12/31/2016 1406   ALBUMIN 3.7 10/02/2017 1502   ALBUMIN 3.6 12/31/2016 1406   AST 19 10/02/2017 1502   AST 18 12/31/2016 1406   ALT 13 10/02/2017 1502   ALT 11 12/31/2016 1406   ALKPHOS 86 10/02/2017 1502   ALKPHOS 71 12/31/2016 1406   BILITOT 0.6 10/02/2017 1502   BILITOT 0.49 12/31/2016 1406   GFRNONAA 42 (L) 10/02/2017 1502   GFRAA 49 (L)  10/02/2017 1502       Component Value Date/Time   WBC 5.7 10/02/2017 1502   WBC 4.0 09/01/2017 1112   RBC 4.23 10/02/2017 1502   HGB 12.3 10/02/2017 1502   HGB 12.7 12/31/2016 1405   HCT 37.2 10/02/2017 1502   HCT 38.4 12/31/2016 1405   PLT 97 (L) 10/02/2017 1502   PLT 145 12/31/2016 1405   MCV 88.0 10/02/2017 1502   MCV 92.8 12/31/2016 1405   MCH 29.1 10/02/2017  1502   MCHC 33.1 10/02/2017 1502   RDW 13.6 10/02/2017 1502   RDW 12.9 12/31/2016 1405   LYMPHSABS 3.0 10/02/2017 1502   LYMPHSABS 20.2 (H) 12/31/2016 1405   MONOABS 0.4 10/02/2017 1502   MONOABS 0.4 12/31/2016 1405   EOSABS 0.2 10/02/2017 1502   EOSABS 0.6 (H) 12/31/2016 1405   BASOSABS 0.2 (H) 10/02/2017 1502   BASOSABS 0.1 12/31/2016 1405   -------------------------------  Imaging from last 24 hours (if applicable):  Radiology interpretation: Dg Chest 2 View  Result Date: 10/02/2017 CLINICAL DATA:  Pt c/o productive cough x 8 months. Hx of diverticulitis, leukemia. EXAM: CHEST - 2 VIEW COMPARISON:  None. FINDINGS: Port in the anterior chest wall with tip in distal SVC. Normal mediastinum and cardiac silhouette. Normal pulmonary vasculature. No evidence of effusion, infiltrate, or pneumothorax. No acute bony abnormality. IMPRESSION: No acute cardiopulmonary process. Electronically Signed   By: Suzy Bouchard M.D.   On: 10/02/2017 11:25

## 2017-10-13 ENCOUNTER — Other Ambulatory Visit: Payer: Self-pay | Admitting: *Deleted

## 2017-10-13 ENCOUNTER — Telehealth: Payer: Self-pay | Admitting: *Deleted

## 2017-10-14 ENCOUNTER — Telehealth: Payer: Self-pay | Admitting: Oncology

## 2017-10-14 NOTE — Telephone Encounter (Signed)
Spoke to patient regarding upcoming nov appts per 8/20 sch message.

## 2017-10-16 ENCOUNTER — Other Ambulatory Visit: Payer: Medicare Other

## 2017-10-19 ENCOUNTER — Ambulatory Visit (INDEPENDENT_AMBULATORY_CARE_PROVIDER_SITE_OTHER): Payer: Medicare Other | Admitting: Obstetrics

## 2017-10-19 ENCOUNTER — Encounter: Payer: Self-pay | Admitting: Obstetrics

## 2017-10-19 VITALS — Ht 58.5 in | Wt 113.5 lb

## 2017-10-19 DIAGNOSIS — F330 Major depressive disorder, recurrent, mild: Secondary | ICD-10-CM

## 2017-10-19 DIAGNOSIS — N951 Menopausal and female climacteric states: Secondary | ICD-10-CM | POA: Diagnosis not present

## 2017-10-19 DIAGNOSIS — Z79890 Hormone replacement therapy (postmenopausal): Secondary | ICD-10-CM | POA: Diagnosis not present

## 2017-10-19 DIAGNOSIS — C9190 Lymphoid leukemia, unspecified not having achieved remission: Secondary | ICD-10-CM | POA: Diagnosis not present

## 2017-10-19 DIAGNOSIS — C9110 Chronic lymphocytic leukemia of B-cell type not having achieved remission: Secondary | ICD-10-CM

## 2017-10-19 MED ORDER — PROGESTERONE MICRONIZED 200 MG PO CAPS: 200.0000 mg | ORAL_CAPSULE | Freq: Every day | ORAL | 3 refills | Status: DC

## 2017-10-19 NOTE — Progress Notes (Signed)
Patient is in the office to discuss hot flashes. Pt states that she averages between 5-10 and is having trouble sleeping with them.

## 2017-10-19 NOTE — Progress Notes (Signed)
Patient ID: Rachel [name], female   DOB: 10-22-1950, 68 y.o.   MRN: [id number]  Chief Complaint  Patient presents with  . GYN    HPI Rachel [name] is a 67 y.o. female.  Hot flashes are more frequent.  Having ~10 hot flashes a day. HPI  Past Medical History:  Diagnosis Date  . Cancer ([place])   . Diverticulitis   . Leukemia ([place])   . Lower back pain     Past Surgical History:  Procedure Laterality Date  . BREAST SURGERY  2015   left breast bx  . BUNIONECTOMY     right foot  . lining of uterus removed    . OTHER SURGICAL HISTORY     removal of uterine lining  . TONSILLECTOMY      Family History  Problem Relation Age of Onset  . Depression Son     Social History Social History   Tobacco Use  . Smoking status: Never Smoker  . Smokeless tobacco: Never Used  Substance Use Topics  . Alcohol use: Yes    Alcohol/week: 0.0 standard drinks    Comment: socially  . Drug use: No    Allergies  Allergen Reactions  . Penicillins Anaphylaxis    "stopped breathing" "drops my heartbeat" "I just pass out"    Current Outpatient Medications  Medication Sig Dispense Refill  . Calcium Citrate (CITRACAL PO) Take 4 tablets by mouth daily.     . cholecalciferol (VITAMIN D) 1000 UNITS tablet Take 4,000 Units by mouth daily.    . Estradiol (ELESTRIN) 0.52 MG/0.87 GM (0.06%) GEL APPLY 2 PUMPS THINLY TO UPPER ARM DAILY AT BEDTIME 6 Bottle 3  . fluticasone (FLONASE) 50 MCG/ACT nasal spray Place into both nostrils daily.  2  . hydrOXYzine (ATARAX/VISTARIL) 25 MG tablet Take 1 tablet (25 mg total) 3 (three) times daily as needed by mouth. 30 tablet 0  . progesterone (PROMETRIUM) 200 MG capsule Take 1 capsule (200 mg total) by mouth daily. 90 capsule 3  . traZODone (DESYREL) 50 MG tablet Take 1 tablet (50 mg total) by mouth at bedtime. 90 tablet 4  . venlafaxine XR (EFFEXOR-XR) 75 MG 24 hr capsule Take 1 capsule (75 mg total) by mouth daily with breakfast. 90 capsule 4  .  acetaminophen (TYLENOL) 500 MG tablet Take 500 mg by mouth every 6 (six) hours as needed for headache.    Marland Kitchen acyclovir (ZOVIRAX) 400 MG tablet TAKE 1 TABLET TWICE A DAY (Patient not taking: Reported on 10/08/2017) 180 tablet 3  . allopurinol (ZYLOPRIM) 300 MG tablet TAKE 1 TABLET DAILY (Patient not taking: Reported on 10/08/2017) 90 tablet 3  . ciprofloxacin (CIPRO) 500 MG tablet Take 1 tablet (500 mg total) by mouth 2 (two) times daily. (Patient not taking: Reported on 10/08/2017) 10 tablet 0  . ibrutinib (IMBRUVICA) 140 MG tablet Take 3 tablets (420 mg total) daily by mouth. (Patient not taking: Reported on 10/08/2017) 270 tablet 6  . lidocaine-prilocaine (EMLA) cream Apply a nickel size amount to skin on top of port- cover with saran wrap at least 1 hour before access (Patient not taking: Reported on 10/19/2017) 30 g 0  . permethrin (ELIMITE) 1 % lotion Shampoo, rinse and towel dry hair, saturate hair and scalp and skin with permethrin. Rinse after 10 min; repeat in 1 week if needed 120 mL 0  . triamcinolone lotion (KENALOG) 0.1 % Apply 1 application topically 4 (four) times daily. (Patient not taking: Reported on 10/19/2017) 60 mL 1  No current facility-administered medications for this visit.     Review of Systems Review of Systems Constitutional: negative for fatigue and weight loss Respiratory: negative for cough and wheezing Cardiovascular: negative for chest pain, fatigue and palpitations Gastrointestinal: negative for abdominal pain and change in bowel habits Genitourinary:negative Integument/breast: negative for nipple discharge Musculoskeletal:negative for myalgias Neurological: negative for gait problems and tremors Behavioral/Psych: negative for abusive relationship, depression Endocrine: POSITIVE for hot flashes   Height 4' 10.5" (1.486 m), weight 113 lb 8 oz (51.5 kg).  Physical Exam Physical Exam:  Deferred  >50% of 15 min visit spent on counseling and coordination of care.    Data Reviewed Medication  Assessment     1. Hot flashes due to menopause Rx: - continue Elestrin Gel -change Prometrium to daily   2. Postmenopausal hormone replacement therapy Rx: - progesterone (PROMETRIUM) 200 MG capsule; Take 1 capsule (200 mg total) by mouth daily.  Dispense: 90 capsule; Refill: 3 - Elestrin Gel daily as directed  3. 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 CLL (chronic lymphocytic leukemia) ([place]).  Stable. - managed by Hematology / Oncology    Plan    Follow up in 1 month.  May have to switch from Elestrin / Prometrium to [employer]   No orders of the defined types were placed in this encounter.  Meds ordered this encounter  Medications  . progesterone (PROMETRIUM) 200 MG capsule    Sig: Take 1 capsule (200 mg total) by mouth daily.    Dispense:  90 capsule    Refill:  3     [name] MD 10-19-2017

## 2017-10-20 DIAGNOSIS — J0130 Acute sphenoidal sinusitis, unspecified: Secondary | ICD-10-CM | POA: Diagnosis not present

## 2017-10-22 NOTE — Telephone Encounter (Signed)
No entry 

## 2017-10-24 ENCOUNTER — Encounter

## 2017-10-30 ENCOUNTER — Other Ambulatory Visit: Payer: Self-pay | Admitting: Medical

## 2017-10-30 ENCOUNTER — Telehealth: Payer: Self-pay | Admitting: Emergency Medicine

## 2017-10-30 DIAGNOSIS — R21 Rash and other nonspecific skin eruption: Secondary | ICD-10-CM

## 2017-10-30 MED ORDER — DOXYCYCLINE HYCLATE 100 MG PO TABS: 100.0000 mg | ORAL_TABLET | Freq: Two times a day (BID) | ORAL | 0 refills | Status: DC

## 2017-10-30 MED ORDER — PREDNISONE 5 MG PO TABS
ORAL_TABLET | ORAL | 0 refills | Status: DC
Start: 2017-10-30 — End: 2017-11-20

## 2017-10-30 NOTE — Telephone Encounter (Signed)
Returning pt's VM about continuing rash/bug bites.  Pt states that despite following d/c instructions she continues to develop more bites with increased itching.  Per PA Lucianne Lei pt to start doxycycline and prednisone scrips sent into [facility] to manage symptoms.  Pt also to have referral for dermatologist.  Verbalized understanding that she can call with further questions or concerns.

## 2017-11-02 DIAGNOSIS — R413 Other amnesia: Secondary | ICD-10-CM | POA: Diagnosis not present

## 2017-11-02 DIAGNOSIS — R21 Rash and other nonspecific skin eruption: Secondary | ICD-10-CM | POA: Diagnosis not present

## 2017-11-03 ENCOUNTER — Ambulatory Visit (INDEPENDENT_AMBULATORY_CARE_PROVIDER_SITE_OTHER): Payer: Medicare Other | Admitting: Allergy and Immunology

## 2017-11-03 ENCOUNTER — Encounter: Payer: Self-pay | Admitting: Allergy and Immunology

## 2017-11-03 VITALS — BP 102/62 | HR 69 | Resp 16 | Ht 58.5 in | Wt 110.8 lb

## 2017-11-03 DIAGNOSIS — J4520 Mild intermittent asthma, uncomplicated: Secondary | ICD-10-CM

## 2017-11-03 DIAGNOSIS — J310 Chronic rhinitis: Secondary | ICD-10-CM

## 2017-11-03 DIAGNOSIS — Z91018 Allergy to other foods: Secondary | ICD-10-CM

## 2017-11-03 MED ORDER — AZELASTINE HCL 0.1 % NA SOLN: 2.0000 | Freq: Two times a day (BID) | NASAL | 5 refills | Status: DC

## 2017-11-03 MED ORDER — ALBUTEROL SULFATE HFA 108 (90 BASE) MCG/ACT IN AERS: 2.0000 | INHALATION_SPRAY | Freq: Four times a day (QID) | RESPIRATORY_TRACT | 1 refills | Status: DC | PRN

## 2017-11-03 NOTE — Progress Notes (Signed)
New Patient Note  RE: Rachel [name] MRN: [id number] DOB: Apr 16, 1950 Date of Office Visit: 11/03/2017  Referring provider: Leeroy Cha,* Primary care provider: Leeroy Cha, MD  Chief Complaint: Nasal Congestion; Sinus Problem; and Food Intolerance   History of present illness: Rachel [name] is a 67 y.o. female seen today in consultation requested by Leeroy Cha, MD.  She reports that after having moved from [place] to [place] in 1985 she has experienced seasonal nasal and ocular symptoms, however since January of this year her symptoms have progressed significantly.  She complains of nasal congestion, thick postnasal drainage "constantly", throat irritation, rhinorrhea, and ocular pruritus.  She states that the symptoms make her "miserable."  These symptoms occur year-round but seem to specifically be triggered by exposure to pollen and mold.  She was evaluated for allergies years ago with blood work and was told that her postnasal drainage was caused by cows milk, wheat, rye, and sunflower.  She does not experience cutaneous cardiopulmonary, or GI symptoms with the consumption of these foods and is unable to tell if they trigger postnasal drainage because she has postnasal drainage "always."  She experiences coughing, particularly at nighttime, as well as occasional episodes of wheezing and chest tightness.  She went to the emergency department while in [place], [place] 2 summers ago for symptoms consistent with an asthma exacerbation.  She reports that a few times a year she required albuterol rescue 2 or 3 times per day for the span of a week but is unable to identify any specific seasonal variation or environmental triggers.  Assessment and plan: Chronic rhinitis All seasonal and perennial aeroallergen skin tests are negative despite a positive histamine control.  Intranasal steroids, intranasal antihistamines, and first generation  antihistamines are effective for symptoms associated with non-allergic rhinitis, whereas second generation antihistamines such as cetirizine (Zyrtec), loratadine (Claritin) and fexofenadine (Allegra) have been found to be ineffective for this condition.  A prescription has been provided for azelastine nasal spray, one spray per nostril 1-2 times daily as needed. Proper nasal spray technique has been discussed and demonstrated.  Nasal saline lavage (NeilMed) has been recommended as needed and prior to medicated nasal sprays along with instructions for proper administration.  For thick post nasal drainage, add guaifenesin 600 mg (Mucinex)  twice daily as needed with adequate hydration as discussed.  Mild intermittent asthma  Continue to have access to albuterol HFA, 1 to 2 inhalations every 4-6 hours if needed.  History of food allergy Food allergen skin tests were negative today despite a positive histamine control.  The negative predictive value for food allergen skin testing is excellent.  However, the patient would like to confirm these results with blood work.  A laboratory order form has been provided for serum specific IgE against egg white, egg yolk, egg components, cow's milk, cow's milk components, wheat, and rye.  Until these allergies have been ruled out, continue avoidance.   Meds ordered this encounter  Medications  . azelastine (ASTELIN) 0.1 % nasal spray    Sig: Place 2 sprays into both nostrils 2 (two) times daily.    Dispense:  30 mL    Refill:  5  . albuterol (PROVENTIL HFA;VENTOLIN HFA) 108 (90 Base) MCG/ACT inhaler    Sig: Inhale 2 puffs into the lungs every 6 (six) hours as needed for wheezing or shortness of breath.    Dispense:  1 Inhaler    Refill:  1    Diagnostics: Spirometry: Spirometry  reveals an FVC of 2.45 L (100% predicted) and an FEV1 of 2.06 L (111% predicted).  This study was performed while the patient was asymptomatic.  Please see scanned spirometry  results for details. Epicutaneous testing: Negative despite a positive histamine control. Intradermal testing: Negative. Food allergen skin testing: Negative despite a positive histamine control.    Physical examination: Blood pressure 102/62, pulse 69, resp. rate 16, height 4' 10.5" (1.486 m), weight 110 lb 12.8 oz (50.3 kg), SpO2 97 %.  General: Alert, interactive, in no acute distress. HEENT: TMs pearly gray, turbinates moderately edematous with thick discharge, post-pharynx moderately erythematous. Neck: Supple without lymphadenopathy. Lungs: Clear to auscultation without wheezing, rhonchi or rales. CV: Normal S1, S2 without murmurs. Abdomen: Nondistended, nontender. Skin: Warm and dry, without lesions or rashes. Extremities:  No clubbing, cyanosis or edema. Neuro:   Grossly intact.  Review of systems:  Review of systems negative except as noted in HPI / PMHx or noted below: Review of Systems  Constitutional: Negative.   HENT: Negative.   Eyes: Negative.   Respiratory: Negative.   Cardiovascular: Negative.   Gastrointestinal: Negative.   Genitourinary: Negative.   Musculoskeletal: Negative.   Skin: Negative.   Neurological: Negative.   Endo/Heme/Allergies: Negative.   Psychiatric/Behavioral: Negative.     Past medical history:  Past Medical History:  Diagnosis Date  . Chronic lymphoblastic leukemia ([place]) 02/1998  . Diverticulitis   . Leukemia ([place])   . Lower back pain     Past surgical history:  Past Surgical History:  Procedure Laterality Date  . BREAST SURGERY  2015   left breast bx  . BUNIONECTOMY     right foot  . lining of uterus removed    . OTHER SURGICAL HISTORY     removal of uterine lining  . TONSILLECTOMY      Family history: Family History  Problem Relation Age of Onset  . Depression Son     Social history: Social History   Socioeconomic History  . Marital status: Married    Spouse name: Not on file  . Number of children: Not on  file  . Years of education: Not on file  . Highest education level: Not on file  Occupational History  . Not on file  Social Needs  . Financial resource strain: Not on file  . Food insecurity:    Worry: Not on file    Inability: Not on file  . Transportation needs:    Medical: Not on file    Non-medical: Not on file  Tobacco Use  . Smoking status: Never Smoker  . Smokeless tobacco: Never Used  Substance and Sexual Activity  . Alcohol use: Yes    Alcohol/week: 0.0 standard drinks    Comment: socially  . Drug use: No  . Sexual activity: Not Currently    Partners: Male    Birth control/protection: None  Lifestyle  . Physical activity:    Days per week: Not on file    Minutes per session: Not on file  . Stress: Not on file  Relationships  . Social connections:    Talks on phone: Not on file    Gets together: Not on file    Attends religious service: Not on file    Active member of club or organization: Not on file    Attends meetings of clubs or organizations: Not on file    Relationship status: Not on file  . Intimate partner violence:    Fear of current or ex  partner: Not on file    Emotionally abused: Not on file    Physically abused: Not on file    Forced sexual activity: Not on file  Other Topics Concern  . Not on file  Social History Narrative  . Not on file   Environmental History: The patient lives in a house with carpeting in the bedroom, gas heat, and central air.  She is a non-smoker without pets.  There is no known mold/water damage in the home.  Allergies as of 11/03/2017      Reactions   Penicillins Anaphylaxis   "stopped breathing" "drops my heartbeat" "I just pass out"      Medication List        Accurate as of 11/03/17 12:32 PM. Always use your most recent med list.          acetaminophen 500 MG tablet Commonly known as:  TYLENOL Take 500 mg by mouth every 6 (six) hours as needed for headache.   albuterol 108 (90 Base) MCG/ACT  inhaler Commonly known as:  PROVENTIL HFA;VENTOLIN HFA Inhale 2 puffs into the lungs every 6 (six) hours as needed for wheezing or shortness of breath.   azelastine 0.1 % nasal spray Commonly known as:  ASTELIN Place 2 sprays into both nostrils 2 (two) times daily.   cholecalciferol 1000 units tablet Commonly known as:  VITAMIN D Take 4,000 Units by mouth daily.   CITRACAL PO Take 4 tablets by mouth daily.   Estradiol 0.52 MG/0.87 GM (0.06%) Gel APPLY 2 PUMPS THINLY TO UPPER ARM DAILY AT BEDTIME   FLONASE 50 MCG/ACT nasal spray Generic drug:  fluticasone Place into both nostrils daily.   guaiFENesin 600 MG 12 hr tablet Commonly known as:  MUCINEX Take 600 mg by mouth 2 (two) times daily as needed.   hydrOXYzine 25 MG tablet Commonly known as:  ATARAX/VISTARIL Take 1 tablet (25 mg total) 3 (three) times daily as needed by mouth.   levocetirizine 5 MG tablet Commonly known as:  XYZAL Take 5 mg by mouth every evening.   lidocaine-prilocaine cream Commonly known as:  EMLA Apply a nickel size amount to skin on top of port- cover with saran wrap at least 1 hour before access   Magnesium 250 MG Tabs Take 1 tablet by mouth daily.   meclizine 12.5 MG tablet Commonly known as:  ANTIVERT Take 12.5 mg by mouth 3 (three) times daily as needed for dizziness.   omeprazole 40 MG capsule Commonly known as:  PRILOSEC   predniSONE 5 MG tablet Commonly known as:  DELTASONE 6 tab x 1 day, 5 tab x 1 day, 4 tab x 1 day, 3 tab x 1 day, 2 tab x 1 day, 1 tab x 1 day, stop   progesterone 200 MG capsule Commonly known as:  PROMETRIUM Take 1 capsule (200 mg total) by mouth daily.   traZODone 100 MG tablet Commonly known as:  DESYREL Take 100 mg by mouth at bedtime.       Known medication allergies: Allergies  Allergen Reactions  . Penicillins Anaphylaxis    "stopped breathing" "drops my heartbeat" "I just pass out"    I appreciate the opportunity to take part in Jetta's  care. Please do not hesitate to contact me with questions.  Sincerely,   R. Edgar Frisk, MD

## 2017-11-03 NOTE — Assessment & Plan Note (Signed)
   Continue to have access to albuterol HFA, 1 to 2 inhalations every 4-6 hours if needed.

## 2017-11-03 NOTE — Assessment & Plan Note (Signed)
Food allergen skin tests were negative today despite a positive histamine control.  The negative predictive value for food allergen skin testing is excellent.  However, the patient would like to confirm these results with blood work.  A laboratory order form has been provided for serum specific IgE against egg white, egg yolk, egg components, cow's milk, cow's milk components, wheat, and rye.  Until these allergies have been ruled out, continue avoidance.

## 2017-11-03 NOTE — Patient Instructions (Addendum)
Chronic rhinitis All seasonal and perennial aeroallergen skin tests are negative despite a positive histamine control.  Intranasal steroids, intranasal antihistamines, and first generation antihistamines are effective for symptoms associated with non-allergic rhinitis, whereas second generation antihistamines such as cetirizine (Zyrtec), loratadine (Claritin) and fexofenadine (Allegra) have been found to be ineffective for this condition.  A prescription has been provided for azelastine nasal spray, one spray per nostril 1-2 times daily as needed. Proper nasal spray technique has been discussed and demonstrated.  Nasal saline lavage (NeilMed) has been recommended as needed and prior to medicated nasal sprays along with instructions for proper administration.  For thick post nasal drainage, add guaifenesin 600 mg (Mucinex)  twice daily as needed with adequate hydration as discussed.  Mild intermittent asthma  Continue to have access to albuterol HFA, 1 to 2 inhalations every 4-6 hours if needed.  History of food allergy Food allergen skin tests were negative today despite a positive histamine control.  The negative predictive value for food allergen skin testing is excellent.  However, the patient would like to confirm these results with blood work.  A laboratory order form has been provided for serum specific IgE against egg white, egg yolk, egg components, cow's milk, cow's milk components, wheat, and rye.  Until these allergies have been ruled out, continue avoidance.   When lab results have returned the patient will be called with further recommendations and follow up instructions.  If nasal/sinus symptoms persist or progress, seek further evaluation by otolaryngology (ear nose and throat physician).

## 2017-11-03 NOTE — Assessment & Plan Note (Signed)
All seasonal and perennial aeroallergen skin tests are negative despite a positive histamine control.  Intranasal steroids, intranasal antihistamines, and first generation antihistamines are effective for symptoms associated with non-allergic rhinitis, whereas second generation antihistamines such as cetirizine (Zyrtec), loratadine (Claritin) and fexofenadine (Allegra) have been found to be ineffective for this condition.  A prescription has been provided for azelastine nasal spray, one spray per nostril 1-2 times daily as needed. Proper nasal spray technique has been discussed and demonstrated.  Nasal saline lavage (NeilMed) has been recommended as needed and prior to medicated nasal sprays along with instructions for proper administration.  For thick post nasal drainage, add guaifenesin 600 mg (Mucinex)  twice daily as needed with adequate hydration as discussed.

## 2017-11-04 ENCOUNTER — Other Ambulatory Visit: Payer: Self-pay | Admitting: *Deleted

## 2017-11-06 LAB — ALLERGEN MILK: Milk IgE: <0.1 kU/L

## 2017-11-06 LAB — ALLERGEN EGG WHITE F1: Egg White IgE: <0.1 kU/L

## 2017-11-06 LAB — EGG COMPONENT PANEL
F232-IgE Ovalbumin: <0.1 kU/L
F233-IgE Ovomucoid: <0.1 kU/L

## 2017-11-06 LAB — MILK COMPONENT PANEL
F076-IgE Alpha Lactalbumin: <0.1 kU/L
F077-IgE Beta Lactoglobulin: <0.1 kU/L
F078-IgE Casein: <0.1 kU/L

## 2017-11-06 LAB — ALLERGEN, WHEAT, F4: Wheat IgE: <0.1 kU/L

## 2017-11-06 LAB — ALLERGEN, RYE, F5: Rye IgE: <0.1 kU/L

## 2017-11-06 LAB — ALLERGEN EGG YOLK F75: IgE Egg (Yolk): <0.1 kU/L

## 2017-11-12 DIAGNOSIS — L718 Other rosacea: Secondary | ICD-10-CM | POA: Diagnosis not present

## 2017-11-12 DIAGNOSIS — L308 Other specified dermatitis: Secondary | ICD-10-CM | POA: Diagnosis not present

## 2017-11-12 DIAGNOSIS — L989 Disorder of the skin and subcutaneous tissue, unspecified: Secondary | ICD-10-CM | POA: Diagnosis not present

## 2017-11-17 ENCOUNTER — Other Ambulatory Visit: Payer: Self-pay

## 2017-11-17 NOTE — Telephone Encounter (Signed)
Pt needs refill on Trazodone sent to [facility]. Will contact pt once approved/denied.

## 2017-11-18 ENCOUNTER — Telehealth: Payer: Self-pay | Admitting: Adult Health

## 2017-11-18 ENCOUNTER — Telehealth: Payer: Self-pay

## 2017-11-18 ENCOUNTER — Other Ambulatory Visit: Payer: Self-pay | Admitting: Obstetrics

## 2017-11-18 DIAGNOSIS — F329 Major depressive disorder, single episode, unspecified: Secondary | ICD-10-CM

## 2017-11-18 MED ORDER — TRAZODONE HCL 100 MG PO TABS: 100.0000 mg | ORAL_TABLET | Freq: Every day | ORAL | 11 refills | Status: DC

## 2017-11-18 NOTE — Telephone Encounter (Signed)
Spoke with pt by phone. She states that she stopped taking her Imbruvica in July d/t her CLL is in remission and she didn't want to take the medication any more.  Since then she reports that "my warts have come back".  Pt asked if OK to resume Imbruvica.  Her next appt is not until November, therefore an appt for labs and to see Mendel Ryder will be made at first availability prior to pt resuming Imbruvica. Pt advised to not restart medication until then.  Scheduling message has been sent.

## 2017-11-18 NOTE — Telephone Encounter (Signed)
Pt aware rx was approved and sent to her pharmacy.

## 2017-11-18 NOTE — Telephone Encounter (Signed)
Scheduled appt per 9/25 sch message - pt is aware of appt date and time.   

## 2017-11-20 ENCOUNTER — Encounter: Payer: Self-pay | Admitting: Adult Health

## 2017-11-20 ENCOUNTER — Telehealth: Payer: Self-pay | Admitting: Adult Health

## 2017-11-20 ENCOUNTER — Inpatient Hospital Stay: Payer: Medicare Other

## 2017-11-20 ENCOUNTER — Inpatient Hospital Stay: Payer: Medicare Other | Attending: Oncology | Admitting: Adult Health

## 2017-11-20 VITALS — BP 90/43 | HR 67 | Temp 97.9°F | Resp 18 | Ht 58.5 in | Wt 111.1 lb

## 2017-11-20 DIAGNOSIS — Z9221 Personal history of antineoplastic chemotherapy: Secondary | ICD-10-CM | POA: Diagnosis not present

## 2017-11-20 DIAGNOSIS — Z79899 Other long term (current) drug therapy: Secondary | ICD-10-CM | POA: Diagnosis not present

## 2017-11-20 DIAGNOSIS — C9110 Chronic lymphocytic leukemia of B-cell type not having achieved remission: Secondary | ICD-10-CM

## 2017-11-20 DIAGNOSIS — Z95828 Presence of other vascular implants and grafts: Secondary | ICD-10-CM

## 2017-11-20 LAB — COMPREHENSIVE METABOLIC PANEL
ALBUMIN: 3.8 g/dL (ref 3.5–5.0)
ALT: 12 U/L (ref 0–44)
AST: 18 U/L (ref 15–41)
Alkaline Phosphatase: 64 U/L (ref 38–126)
Anion gap: 8 (ref 5–15)
BILIRUBIN TOTAL: 0.6 mg/dL (ref 0.3–1.2)
BUN: 17 mg/dL (ref 8–23)
CO2: 28 mmol/L (ref 22–32)
CREATININE: 0.99 mg/dL (ref 0.44–1.00)
Calcium: 9.2 mg/dL (ref 8.9–10.3)
Chloride: 107 mmol/L (ref 98–111)
GFR calc Af Amer: >60 mL/min (ref >60)
GFR calc non Af Amer: 57 mL/min — ABNORMAL LOW (ref >60)
GLUCOSE: 135 mg/dL — AB (ref 70–99)
Potassium: 3.6 mmol/L (ref 3.5–5.1)
Sodium: 143 mmol/L (ref 135–145)
TOTAL PROTEIN: 6 g/dL — AB (ref 6.5–8.1)

## 2017-11-20 LAB — CBC WITH DIFFERENTIAL/PLATELET
BASOS ABS: 0 10*3/uL (ref 0.0–0.1)
Basophils Relative: 1 %
EOS ABS: 0.1 10*3/uL (ref 0.0–0.5)
EOS PCT: 3 %
HCT: 37 % (ref 34.8–46.6)
Hemoglobin: 12.7 g/dL (ref 11.6–15.9)
LYMPHS ABS: 3.4 10*3/uL — AB (ref 0.9–3.3)
Lymphocytes Relative: 64 %
MCH: 29.7 pg (ref 25.1–34.0)
MCHC: 34.2 g/dL (ref 31.5–36.0)
MCV: 86.9 fL (ref 79.5–101.0)
Monocytes Absolute: 0.3 10*3/uL (ref 0.1–0.9)
Monocytes Relative: 5 %
Neutro Abs: 1.5 10*3/uL (ref 1.5–6.5)
Neutrophils Relative %: 27 %
Platelets: 85 10*3/uL — ABNORMAL LOW (ref 145–400)
RBC: 4.26 MIL/uL (ref 3.70–5.45)
RDW: 13.1 % (ref 11.2–14.5)
WBC: 5.4 10*3/uL (ref 3.9–10.3)

## 2017-11-20 LAB — LACTATE DEHYDROGENASE: LDH: 185 U/L (ref 98–192)

## 2017-11-20 MED ORDER — HEPARIN SOD (PORK) LOCK FLUSH 100 UNIT/ML IV SOLN
500.0000 [IU] | Freq: Once | INTRAVENOUS | Status: AC
  Administered 2017-11-20: 500 [IU]
  Filled 2017-11-20: qty 5

## 2017-11-20 MED ORDER — SODIUM CHLORIDE 0.9% FLUSH
10.0000 mL | Freq: Once | INTRAVENOUS | Status: AC
  Administered 2017-11-20: 10 mL
  Filled 2017-11-20: qty 10

## 2017-11-20 NOTE — Progress Notes (Signed)
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 She has been treated with Rituxan, ofatumumab, cladribine, bendamustine and currently with ibrutinib, with details summarized below.  INTERVAL HISTORY: Rachel [name] returns today for follow-up and treatment of her chronic lymphocytic leukemia. She is here for an urgent evaluation.  She had stopped ibrutinib due to diarrhea and not really wanting to take it long term.  She has not take in since June.  When she was initially diagnosed with CLL she had warts on her hands and feet.  They improved with the Ibrutinib.  Since she has stopped the Ibrutinib they have returned and are painful and distressing for her.  She would like to restart, but came in for lab work and evaluation prior.     REVIEW OF SYSTEMS: Rachel [name] is experiencing fatigue.  She denies any night sweats, lymphadenopathy, unintentional weight loss, fevers, or chills.  She is without cough, shortness of breath, chest pain, or palpitations.  She isn't having any bowel/bladder issues, or nausea, vomiting.  She previously received the Ibrutinib via [facility].  Otherwise a detailed ROS was non contributory.      PAST MEDICAL HISTORY: Past Medical History:  Diagnosis Date  . Chronic lymphoblastic leukemia ([place]) 02/1998  . Diverticulitis   . Leukemia ([place])   . Lower back pain     PAST SURGICAL HISTORY: Past Surgical History:  Procedure Laterality Date  . BREAST SURGERY  2015   left breast bx  .  BUNIONECTOMY     right foot  . lining of uterus removed    . OTHER SURGICAL HISTORY     removal of uterine lining  . TONSILLECTOMY      FAMILY HISTORY Family History  Problem Relation Age of Onset  . Depression Son     GYNECOLOGIC HISTORY: Menarche age 43, first live birth age 50, she is [place] P3. She stopped having periods in 1999 after endometrial stripping. She continues on hormone replacement.  SOCIAL HISTORY: [name] is retired from [employer]. Her son [name] lives in [place] and has 3 sons; he works for a Pharmacist, community. A second son lives in [place] and has two children. Son [name] lives in [place] and has a daughter, as well as 2 sons by marriage.   ADVANCED DIRECTIVES: In place  HEALTH MAINTENANCE: Social History   Tobacco Use  . Smoking status: Never Smoker  . Smokeless tobacco: Never Used  Substance Use Topics  . Alcohol use: Yes    Alcohol/week: 0.0 standard drinks    Comment: socially  . Drug use: No     Colonoscopy: due  PAP: December 2012/ [name]  Bone density: Due  Lipid panel: per Dr Drema [place]  Allergies  Allergen Reactions  . Penicillins Anaphylaxis    "stopped breathing" "drops my heartbeat" "I just pass out"    Current Outpatient Medications  Medication Sig Dispense Refill  . acetaminophen (TYLENOL) 500 MG tablet Take 500 mg by mouth every 6 (six) hours as needed for headache.    . albuterol (PROVENTIL HFA;VENTOLIN HFA) 108 (90 Base) MCG/ACT inhaler Inhale 2  puffs into the lungs every 6 (six) hours as needed for wheezing or shortness of breath. 1 Inhaler 1  . azelastine (ASTELIN) 0.1 % nasal spray Place 2 sprays into both nostrils 2 (two) times daily. 30 mL 5  . Calcium Citrate (CITRACAL PO) Take 4 tablets by mouth daily.     . cholecalciferol (VITAMIN D) 1000 UNITS tablet Take 4,000 Units by mouth daily.    . Estradiol (ELESTRIN) 0.52 MG/0.87 GM (0.06%) GEL APPLY 2 PUMPS THINLY TO UPPER ARM DAILY AT BEDTIME 6 Bottle 3  .  fluticasone (FLONASE) 50 MCG/ACT nasal spray Place into both nostrils daily.  2  . guaiFENesin (MUCINEX) 600 MG 12 hr tablet Take 600 mg by mouth 2 (two) times daily as needed.    . hydrOXYzine (ATARAX/VISTARIL) 25 MG tablet Take 1 tablet (25 mg total) 3 (three) times daily as needed by mouth. 30 tablet 0  . levocetirizine (XYZAL) 5 MG tablet Take 5 mg by mouth every evening.    . lidocaine-prilocaine (EMLA) cream Apply a nickel size amount to skin on top of port- cover with saran wrap at least 1 hour before access 30 g 0  . Magnesium 250 MG TABS Take 1 tablet by mouth daily.    . meclizine (ANTIVERT) 12.5 MG tablet Take 12.5 mg by mouth 3 (three) times daily as needed for dizziness.    Marland Kitchen omeprazole (PRILOSEC) 40 MG capsule   5  . progesterone (PROMETRIUM) 200 MG capsule Take 1 capsule (200 mg total) by mouth daily. 90 capsule 3  . traZODone (DESYREL) 100 MG tablet Take 1 tablet (100 mg total) by mouth at bedtime. 30 tablet 11   No current facility-administered medications for this visit.     OBJECTIVE:  Vitals:   11/20/17 0918  BP: (!) 90/43  Pulse: 67  Resp: 18  Temp: 97.9 F (36.6 C)  SpO2: 100%   Filed Weights   11/20/17 0918  Weight: 111 lb 1.6 oz (50.4 kg)   Body mass index is 22.82 kg/m.    ECOG FS: 1 GENERAL: Patient is a well appearing female in no acute distress HEENT:  Sclerae anicteric.  Oropharynx clear and moist. No ulcerations or evidence of oropharyngeal candidiasis. Neck is supple.  NODES:  No cervical, supraclavicular, or axillary lymphadenopathy palpated.  BREAST EXAM:  Deferred. LUNGS:  Clear to auscultation bilaterally.  No wheezes or rhonchi. HEART:  Regular rate and rhythm. No murmur appreciated. ABDOMEN:  Soft, nontender.  Positive, normoactive bowel sounds. No organomegaly palpated. MSK:  No focal spinal tenderness to palpation. Full range of motion bilaterally in the upper extremities. EXTREMITIES:  No peripheral edema.   SKIN:  Warts noted on fingers  and tips of fingers, and on toes and soles of feet.   No nail dyscrasia. NEURO:  Nonfocal. Well oriented.  Appropriate affect.      LAB RESULTS:  Lab Results  Component Value Date   WBC 5.4 11/20/2017   NEUTROABS 1.5 11/20/2017   HGB 12.7 11/20/2017   HCT 37.0 11/20/2017   MCV 86.9 11/20/2017   PLT 85 (L) 11/20/2017      Chemistry      Component Value Date/Time   NA 143 11/20/2017 0824   NA 140 12/31/2016 1406   K 3.6 11/20/2017 0824   K 3.6 12/31/2016 1406   CL 107 11/20/2017 0824   CL 101 12/15/2011 1322   CO2 28 11/20/2017 0824   CO2 24 12/31/2016 1406   BUN 17 11/20/2017 0824  BUN 12.8 12/31/2016 1406   CREATININE 0.99 11/20/2017 0824   CREATININE 1.27 (H) 10/02/2017 1502   CREATININE 0.9 12/31/2016 1406      Component Value Date/Time   CALCIUM 9.2 11/20/2017 0824   CALCIUM 8.8 12/31/2016 1406   ALKPHOS 64 11/20/2017 0824   ALKPHOS 71 12/31/2016 1406   AST 18 11/20/2017 0824   AST 19 10/02/2017 1502   AST 18 12/31/2016 1406   ALT 12 11/20/2017 0824   ALT 13 10/02/2017 1502   ALT 11 12/31/2016 1406   BILITOT 0.6 11/20/2017 0824   BILITOT 0.6 10/02/2017 1502   BILITOT 0.49 12/31/2016 1406     STUDIES: No results found.   ASSESSMENT: 67 y.o.  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 rigors 02/27/2014: received IV  vancomycin and levaquin briefly, then levaquin po (pcn allergy)  (6) bendamustine/ rituximab for 6 cycles, completed 05/04/2014, with improvement but not normalization of the absolute lymphocyte count  (7) ibrutinib started 05/08/2014, currently at 420mg  daily. Stopped by patient in June, 2019 restarted in September, 2019.   (a) hepatitis B studies March 06 2014-  PLAN: Rachel [name] is doing moderately well.  We reviewed her counts, which show a declining plt count and her lymphocytes are increasing.  Her WBC and HGB are normal.  I reviewed her case with Dr. Lindi Adie.  She can restart the Ibrutinib back at 420mg  daily as she had been.  Her next f/u with Dr. Jana Hakim is in November.  I will review her case with him when he returns from vacation and will adjust her appointment with him if he determines it is necessary to do so.    Otherwise, she will return in November.  She knows to call for any problems that may develop before the next visit here.  A total of (20) minutes of face-to-face time was spent with this patient with greater than 50% of that time in counseling and care-coordination.   Wilber Bihari, NP  11/20/17 9:54 AM Medical Oncology and Hematology [facility] [address] [place], [place] [address] Tel. [phone]    Fax. [phone]

## 2017-11-20 NOTE — Telephone Encounter (Signed)
Per 9/27 los, no orders.

## 2017-11-21 LAB — BETA 2 MICROGLOBULIN, SERUM: BETA 2 MICROGLOBULIN: 4 mg/L — AB (ref 0.6–2.4)

## 2017-12-23 DIAGNOSIS — D630 Anemia in neoplastic disease: Secondary | ICD-10-CM | POA: Diagnosis not present

## 2017-12-23 DIAGNOSIS — J45909 Unspecified asthma, uncomplicated: Secondary | ICD-10-CM | POA: Diagnosis not present

## 2017-12-23 DIAGNOSIS — F3341 Major depressive disorder, recurrent, in partial remission: Secondary | ICD-10-CM | POA: Diagnosis not present

## 2017-12-29 ENCOUNTER — Other Ambulatory Visit: Payer: Self-pay | Admitting: Oncology

## 2017-12-29 ENCOUNTER — Other Ambulatory Visit: Payer: Self-pay | Admitting: *Deleted

## 2017-12-29 DIAGNOSIS — C9110 Chronic lymphocytic leukemia of B-cell type not having achieved remission: Secondary | ICD-10-CM

## 2017-12-29 DIAGNOSIS — J310 Chronic rhinitis: Secondary | ICD-10-CM

## 2017-12-29 DIAGNOSIS — C8300 Small cell B-cell lymphoma, unspecified site: Secondary | ICD-10-CM

## 2017-12-29 DIAGNOSIS — R87612 Low grade squamous intraepithelial lesion on cytologic smear of cervix (LGSIL): Secondary | ICD-10-CM

## 2018-01-05 DIAGNOSIS — H35023 Exudative retinopathy, bilateral: Secondary | ICD-10-CM | POA: Diagnosis not present

## 2018-01-06 ENCOUNTER — Ambulatory Visit (HOSPITAL_COMMUNITY): Payer: Medicare Other

## 2018-01-06 ENCOUNTER — Encounter (HOSPITAL_COMMUNITY)
Admission: RE | Admit: 2018-01-06 | Discharge: 2018-01-06 | Disposition: A | Discharge status: Home / Self Care | Payer: Medicare Other | Source: Ambulatory Visit | Attending: Oncology | Admitting: Oncology

## 2018-01-06 DIAGNOSIS — C8300 Small cell B-cell lymphoma, unspecified site: Secondary | ICD-10-CM | POA: Insufficient documentation

## 2018-01-06 DIAGNOSIS — C8590 Non-Hodgkin lymphoma, unspecified, unspecified site: Secondary | ICD-10-CM | POA: Diagnosis not present

## 2018-01-06 LAB — GLUCOSE, CAPILLARY: Glucose-Capillary: 75 mg/dL (ref 70–99)

## 2018-01-06 MED ORDER — FLUDEOXYGLUCOSE F - 18 (FDG) INJECTION: 5.5000 | Freq: Once | INTRAVENOUS | Status: DC | PRN

## 2018-01-07 ENCOUNTER — Inpatient Hospital Stay (HOSPITAL_BASED_OUTPATIENT_CLINIC_OR_DEPARTMENT_OTHER): Payer: Medicare Other | Admitting: Oncology

## 2018-01-07 ENCOUNTER — Inpatient Hospital Stay: Payer: Medicare Other

## 2018-01-07 ENCOUNTER — Inpatient Hospital Stay: Payer: Medicare Other | Attending: Oncology

## 2018-01-07 ENCOUNTER — Telehealth: Payer: Self-pay | Admitting: Oncology

## 2018-01-07 VITALS — BP 107/63 | HR 55 | Temp 98.3°F | Resp 18 | Ht 58.5 in | Wt 113.4 lb

## 2018-01-07 DIAGNOSIS — C9110 Chronic lymphocytic leukemia of B-cell type not having achieved remission: Secondary | ICD-10-CM | POA: Insufficient documentation

## 2018-01-07 DIAGNOSIS — Z9221 Personal history of antineoplastic chemotherapy: Secondary | ICD-10-CM | POA: Diagnosis not present

## 2018-01-07 DIAGNOSIS — Z9225 Personal history of immunosupression therapy: Secondary | ICD-10-CM

## 2018-01-07 DIAGNOSIS — Z95828 Presence of other vascular implants and grafts: Secondary | ICD-10-CM

## 2018-01-07 DIAGNOSIS — Z79899 Other long term (current) drug therapy: Secondary | ICD-10-CM | POA: Diagnosis not present

## 2018-01-07 DIAGNOSIS — C8300 Small cell B-cell lymphoma, unspecified site: Secondary | ICD-10-CM

## 2018-01-07 DIAGNOSIS — I700 Atherosclerosis of aorta: Secondary | ICD-10-CM | POA: Insufficient documentation

## 2018-01-07 LAB — COMPREHENSIVE METABOLIC PANEL
ALBUMIN: 3.9 g/dL (ref 3.5–5.0)
ALT: 11 U/L (ref 0–44)
AST: 19 U/L (ref 15–41)
Alkaline Phosphatase: 74 U/L (ref 38–126)
Anion gap: 8 (ref 5–15)
BUN: 15 mg/dL (ref 8–23)
CHLORIDE: 108 mmol/L (ref 98–111)
CO2: 25 mmol/L (ref 22–32)
Calcium: 8.9 mg/dL (ref 8.9–10.3)
Creatinine, Ser: 0.91 mg/dL (ref 0.44–1.00)
GFR calc Af Amer: >60 mL/min (ref >60)
GFR calc non Af Amer: >60 mL/min (ref >60)
GLUCOSE: 81 mg/dL (ref 70–99)
Potassium: 4 mmol/L (ref 3.5–5.1)
SODIUM: 141 mmol/L (ref 135–145)
Total Bilirubin: 0.6 mg/dL (ref 0.3–1.2)
Total Protein: 6.2 g/dL — ABNORMAL LOW (ref 6.5–8.1)

## 2018-01-07 LAB — CBC WITH DIFFERENTIAL/PLATELET
Basophils Absolute: 0 10*3/uL (ref 0.0–0.1)
Basophils Relative: 0 %
Eosinophils Absolute: 0.2 10*3/uL (ref 0.0–0.5)
Eosinophils Relative: 1 %
HEMATOCRIT: 40.3 % (ref 36.0–46.0)
HEMOGLOBIN: 13.2 g/dL (ref 12.0–15.0)
LYMPHS ABS: 8.2 10*3/uL — AB (ref 0.7–4.0)
LYMPHS PCT: 43 %
MCH: 29.4 pg (ref 26.0–34.0)
MCHC: 32.8 g/dL (ref 30.0–36.0)
MCV: 89.8 fL (ref 80.0–100.0)
MONO ABS: 0.4 10*3/uL (ref 0.1–1.0)
Monocytes Relative: 2 %
Neutro Abs: 10.3 10*3/uL (ref 1.7–17.7)
Neutrophils Relative %: 54 %
Platelets: 119 10*3/uL — ABNORMAL LOW (ref 150–400)
RBC: 4.49 MIL/uL (ref 3.87–5.11)
RDW: 12.2 % (ref 11.5–15.5)
WBC: 19 10*3/uL — ABNORMAL HIGH (ref 4.0–10.5)
nRBC: 0 % (ref 0.0–0.2)

## 2018-01-07 LAB — LACTATE DEHYDROGENASE: LDH: 216 U/L — ABNORMAL HIGH (ref 98–192)

## 2018-01-07 MED ORDER — HEPARIN SOD (PORK) LOCK FLUSH 100 UNIT/ML IV SOLN
250.0000 [IU] | Freq: Once | INTRAVENOUS | Status: DC
  Filled 2018-01-07: qty 5

## 2018-01-07 MED ORDER — SODIUM CHLORIDE 0.9% FLUSH
10.0000 mL | Freq: Once | INTRAVENOUS | Status: AC
  Administered 2018-01-07: 10 mL
  Filled 2018-01-07: qty 10

## 2018-01-07 MED ORDER — HEPARIN SOD (PORK) LOCK FLUSH 100 UNIT/ML IV SOLN
500.0000 [IU] | Freq: Once | INTRAVENOUS | Status: AC
  Administered 2018-01-07: 500 [IU] via INTRAVENOUS
  Filled 2018-01-07: qty 5

## 2018-01-07 NOTE — Progress Notes (Signed)
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 She has been treated with Rituxan, ofatumumab, cladribine, bendamustine and currently with ibrutinib, with details summarized below.  INTERVAL HISTORY: Rachel [name] returns today for follow-up and treatment of her chronic lymphocytic leukemia. She is accompanied by by her husband.  The patient continues on ibrutinib, which she recently restarted at her last visit with Mendel Ryder on 11/20/2017. She has been tolerating her treatment well, reporting her warts have resolved.  Since her last visit here, she underwent a PET on 01/06/2018, showing no hypermetabolic mass or hypermetabolic adenopathy identified. Borderline enlarged left retroperitoneal lymph node exhibits low level FDG uptake, Deauville criteria 2. Aortic Atherosclerosis.   She is due for her next mammogram in February 2020.   REVIEW OF SYSTEMS: Rachel [name] is doing well overall. She recently froze her gym account because she is going to [facility] for about 6 weeks. She has been able to maintain her weight since June 2019. She stays active doing various things, like helping around the house. She endorses mild hot flashes. The patient denies unusual headaches, visual changes, nausea, vomiting, or dizziness. There has been no unusual cough, phlegm production, or pleurisy. This been no change in bowel or bladder habits. The patient denies unexplained fatigue or unexplained weight loss, bleeding, rash, or fever. A detailed review of systems  was otherwise noncontributory.     PAST MEDICAL HISTORY: Past Medical History:  Diagnosis Date  . Chronic lymphoblastic leukemia ([place]) 02/1998  . Diverticulitis   . Leukemia ([place])   . Lower back pain     PAST SURGICAL HISTORY: Past Surgical History:  Procedure Laterality Date  . BREAST SURGERY  2015   left breast bx  . BUNIONECTOMY     right foot  . lining of uterus removed    . OTHER SURGICAL HISTORY     removal of uterine lining  . TONSILLECTOMY      FAMILY HISTORY Family History  Problem Relation Age of Onset  . Depression Son     GYNECOLOGIC HISTORY: Menarche age 69, first live birth age 73, she is [place] P3. She stopped having periods in 1999 after endometrial stripping. She continues on hormone replacement.  SOCIAL HISTORY: [name] is retired from [employer]. Her son [name] lives in [place] and has 3 sons; he works for a Pharmacist, community. A second son lives in [place] and has two children. Son [name] lives in [place] and has a daughter, as well as 2 sons by marriage.   ADVANCED DIRECTIVES: In place  HEALTH MAINTENANCE: Social History   Tobacco Use  . Smoking status: Never Smoker  . Smokeless tobacco: Never Used  Substance Use Topics  . Alcohol use: Yes    Alcohol/week: 0.0 standard drinks    Comment: socially  . Drug use: No     Colonoscopy: due  PAP: December 2012/ [name]  Bone density: Due  Lipid panel: per Dr Drema [place]  Allergies  Allergen Reactions  . Penicillins Anaphylaxis    "stopped breathing" "drops my heartbeat" "I just pass out"    Current Outpatient Medications  Medication  Sig Dispense Refill  . acetaminophen (TYLENOL) 500 MG tablet Take 500 mg by mouth every 6 (six) hours as needed for headache.    . albuterol (PROVENTIL HFA;VENTOLIN HFA) 108 (90 Base) MCG/ACT inhaler Inhale 2 puffs into the lungs every 6 (six) hours as needed for wheezing or shortness of breath. 1 Inhaler 1  . azelastine (ASTELIN) 0.1 % nasal spray  Place 2 sprays into both nostrils 2 (two) times daily. 30 mL 5  . Calcium Citrate (CITRACAL PO) Take 4 tablets by mouth daily.     . cholecalciferol (VITAMIN D) 1000 UNITS tablet Take 4,000 Units by mouth daily.    . Estradiol (ELESTRIN) 0.52 MG/0.87 GM (0.06%) GEL APPLY 2 PUMPS THINLY TO UPPER ARM DAILY AT BEDTIME 6 Bottle 3  . fluticasone (FLONASE) 50 MCG/ACT nasal spray Place into both nostrils daily.  2  . guaiFENesin (MUCINEX) 600 MG 12 hr tablet Take 600 mg by mouth 2 (two) times daily as needed.    . hydrOXYzine (ATARAX/VISTARIL) 25 MG tablet Take 1 tablet (25 mg total) 3 (three) times daily as needed by mouth. 30 tablet 0  . levocetirizine (XYZAL) 5 MG tablet Take 5 mg by mouth every evening.    . lidocaine-prilocaine (EMLA) cream Apply a nickel size amount to skin on top of port- cover with saran wrap at least 1 hour before access 30 g 0  . Magnesium 250 MG TABS Take 1 tablet by mouth daily.    . meclizine (ANTIVERT) 12.5 MG tablet Take 12.5 mg by mouth 3 (three) times daily as needed for dizziness.    Marland Kitchen omeprazole (PRILOSEC) 40 MG capsule   5  . progesterone (PROMETRIUM) 200 MG capsule Take 1 capsule (200 mg total) by mouth daily. 90 capsule 3  . traZODone (DESYREL) 100 MG tablet Take 1 tablet (100 mg total) by mouth at bedtime. 30 tablet 11   No current facility-administered medications for this visit.    Facility-Administered Medications Ordered in Other Visits  Medication Dose Route Frequency Provider Last Rate Last Dose  . fludeoxyglucose F - 18 (FDG) injection 5.5 millicurie  5.5 millicurie Intravenous Once PRN Gaspar Cola, MD        OBJECTIVE: Middle-aged left American woman in no acute distress  Vitals:   01/07/18 1300  BP: 107/63  Pulse: (!) 55  Resp: 18  Temp: 98.3 F (36.8 C)  SpO2: 98%   Filed Weights   01/07/18 1300  Weight: 113 lb 6.4 oz (51.4 kg)   Body mass index is 23.3 kg/m.    ECOG FS: 1  Sclerae unicteric, EOMs intact Oropharynx clear and  moist No cervical or supraclavicular adenopathy, no axillary adenopathy Lungs no rales or rhonchi Heart regular rate and rhythm Abd soft, nontender, positive bowel sounds MSK no focal spinal tenderness, no upper extremity lymphedema Neuro: nonfocal, well oriented, appropriate affect Breasts: Deferred  LAB RESULTS:  Lab Results  Component Value Date   WBC 19.0 (H) 01/07/2018   NEUTROABS PENDING 01/07/2018   HGB 13.2 01/07/2018   HCT 40.3 01/07/2018   MCV 89.8 01/07/2018   PLT 119 (L) 01/07/2018      Chemistry      Component Value Date/Time   NA 143 11/20/2017 0824   NA 140 12/31/2016 1406   K 3.6 11/20/2017 0824   K 3.6 12/31/2016 1406   CL 107 11/20/2017 0824   CL 101 12/15/2011 1322   CO2 28 11/20/2017 0824   CO2 24 12/31/2016 1406   BUN 17  11/20/2017 0824   BUN 12.8 12/31/2016 1406   CREATININE 0.99 11/20/2017 0824   CREATININE 1.27 (H) 10/02/2017 1502   CREATININE 0.9 12/31/2016 1406      Component Value Date/Time   CALCIUM 9.2 11/20/2017 0824   CALCIUM 8.8 12/31/2016 1406   ALKPHOS 64 11/20/2017 0824   ALKPHOS 71 12/31/2016 1406   AST 18 11/20/2017 0824   AST 19 10/02/2017 1502   AST 18 12/31/2016 1406   ALT 12 11/20/2017 0824   ALT 13 10/02/2017 1502   ALT 11 12/31/2016 1406   BILITOT 0.6 11/20/2017 0824   BILITOT 0.6 10/02/2017 1502   BILITOT 0.49 12/31/2016 1406     STUDIES: Nm Pet Image Initial (pi) Skull Base To Thigh  Result Date: 01/06/2018 CLINICAL DATA:  Subsequent treatment strategy for lymphoma. EXAM: NUCLEAR MEDICINE PET SKULL BASE TO THIGH TECHNIQUE: 5.5 mCi F-18 FDG was injected intravenously. Full-ring PET imaging was performed from the skull base to thigh after the radiotracer. CT data was obtained and used for attenuation correction and anatomic localization. Fasting blood glucose: 75 mg/dl COMPARISON:  CT cap 02/08/2016 FINDINGS: Mediastinal blood pool activity: SUV max 2.17 NECK: No hypermetabolic lymph nodes in the neck. Incidental CT  findings: none CHEST: No hypermetabolic mediastinal or hilar nodes. No suspicious pulmonary nodules on the CT scan. Incidental CT findings: Aortic atherosclerosis. ABDOMEN/PELVIS: No focal liver abnormality. No abnormal uptake within the pancreas or spleen. No abnormal uptake within the adrenal glands. Prominent retroperitoneal lymph nodes are identified without significant FDG uptake. Index left retroperitoneal lymph node measures 1.2 cm within SUV max of 1.23, Deauville criteria 2. Prominent left common iliac lymph node measures 0.9 cm and has an SUV max of 1.3, Deauville criteria 2. Incidental CT findings: The spleen measures 11.3 by 4.3 by 12.30 cm (volume = 310 cm^3). SUV max is equal to 2.06. aortic atherosclerosis. SKELETON: No focal hypermetabolic activity to suggest skeletal metastasis. Incidental CT findings: none IMPRESSION: 1. No hypermetabolic mass or hypermetabolic adenopathy identified. 2. Borderline enlarged left retroperitoneal lymph node exhibits low level FDG uptake, Deauville criteria 2. 3.  Aortic Atherosclerosis (ICD10-I70.0). Electronically Signed   By: Kerby Moors M.D.   On: 01/06/2018 11:57     ASSESSMENT: 67 y.o.  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 rigors 02/27/2014: received IV vancomycin and levaquin briefly, then levaquin po (pcn  allergy)  (6) bendamustine/ rituximab for 6 cycles, completed 05/04/2014, with improvement but not normalization of the absolute lymphocyte count  (7) ibrutinib started 05/08/2014, currently at 420mg  daily. Stopped by patient in June, 2019 restarted in September, 2019.  (a) hepatitis B studies 03/06/2014 and 12/31/2016, negative  PLAN: Rachel [name] is now 17 years out from initial diagnosis of chronic lymphocytic leukemia/well of differentiated lymphocytic lymphoma.  Her disease is very well controlled with a PET scan that shows no significant adenopathy.  Clinically she is having no "B" symptoms.  Her platelets are improved, now greater  than 100,000.  The total white cell count is up some.  It is remarkable how her warts recur and go faster every time we go off the ibrutinib.  This is a terrific encouragement to her in terms of compliance  She will have repeat labs mid February and she will see me again in mid May.  She knows to call for any other issues that may develop before the next visit here.  Magrinat, Virgie Dad, MD  01/07/18 1:29 PM Medical Oncology and Hematology [facility] [address] [place], [place] [address] Tel. [phone]    Fax. [phone]  I, Margit Banda am acting as a scribe for Chauncey Cruel, MD.   I, Lurline Del MD, have reviewed the above documentation for accuracy and completeness, and I agree with the above.

## 2018-01-07 NOTE — Telephone Encounter (Signed)
Printed calendar and avs. °

## 2018-01-08 LAB — BETA 2 MICROGLOBULIN, SERUM: BETA 2 MICROGLOBULIN: 3 mg/L — AB (ref 0.6–2.4)

## 2018-01-14 ENCOUNTER — Ambulatory Visit: Payer: Medicare Other | Admitting: Oncology

## 2018-01-14 ENCOUNTER — Other Ambulatory Visit: Payer: Medicare Other

## 2018-02-01 DIAGNOSIS — R062 Wheezing: Secondary | ICD-10-CM | POA: Diagnosis not present

## 2018-02-01 DIAGNOSIS — J069 Acute upper respiratory infection, unspecified: Secondary | ICD-10-CM | POA: Diagnosis not present

## 2018-02-01 DIAGNOSIS — R079 Chest pain, unspecified: Secondary | ICD-10-CM | POA: Diagnosis not present

## 2018-02-01 DIAGNOSIS — R12 Heartburn: Secondary | ICD-10-CM | POA: Diagnosis not present

## 2018-02-01 DIAGNOSIS — C9510 Chronic leukemia of unspecified cell type not having achieved remission: Secondary | ICD-10-CM | POA: Diagnosis not present

## 2018-02-10 ENCOUNTER — Other Ambulatory Visit: Payer: Self-pay | Admitting: Oncology

## 2018-02-10 DIAGNOSIS — R944 Abnormal results of kidney function studies: Secondary | ICD-10-CM | POA: Diagnosis not present

## 2018-02-10 DIAGNOSIS — C9110 Chronic lymphocytic leukemia of B-cell type not having achieved remission: Secondary | ICD-10-CM

## 2018-03-08 DIAGNOSIS — C9110 Chronic lymphocytic leukemia of B-cell type not having achieved remission: Secondary | ICD-10-CM | POA: Diagnosis not present

## 2018-03-08 DIAGNOSIS — H66003 Acute suppurative otitis media without spontaneous rupture of ear drum, bilateral: Secondary | ICD-10-CM | POA: Diagnosis not present

## 2018-03-09 DIAGNOSIS — F411 Generalized anxiety disorder: Secondary | ICD-10-CM | POA: Diagnosis not present

## 2018-03-10 ENCOUNTER — Telehealth: Payer: Self-pay | Admitting: *Deleted

## 2018-03-10 NOTE — Telephone Encounter (Signed)
Pt left VM stating she has been started on doxycycline 100 mg bid for 14 days due to infection - she will be holding the Imbruvica while on the antibiotic and wanted to " just let Dr Jana Hakim know ".  This note will be given to MD.

## 2018-03-19 DIAGNOSIS — H9203 Otalgia, bilateral: Secondary | ICD-10-CM | POA: Diagnosis not present

## 2018-03-19 DIAGNOSIS — Z23 Encounter for immunization: Secondary | ICD-10-CM | POA: Diagnosis not present

## 2018-04-01 ENCOUNTER — Telehealth: Payer: Self-pay | Admitting: Oncology

## 2018-04-01 NOTE — Telephone Encounter (Signed)
Called patient per voicemail log, patient wanted to reschedule her lab appt.  Patient aware of new appt date and time.

## 2018-04-09 ENCOUNTER — Inpatient Hospital Stay: Payer: Medicare Other | Attending: Oncology

## 2018-04-09 ENCOUNTER — Inpatient Hospital Stay: Payer: Medicare Other

## 2018-04-09 DIAGNOSIS — Z95828 Presence of other vascular implants and grafts: Secondary | ICD-10-CM

## 2018-04-09 DIAGNOSIS — Z9221 Personal history of antineoplastic chemotherapy: Secondary | ICD-10-CM | POA: Insufficient documentation

## 2018-04-09 DIAGNOSIS — Z9225 Personal history of immunosupression therapy: Secondary | ICD-10-CM | POA: Diagnosis not present

## 2018-04-09 DIAGNOSIS — C9110 Chronic lymphocytic leukemia of B-cell type not having achieved remission: Secondary | ICD-10-CM | POA: Insufficient documentation

## 2018-04-09 LAB — CBC WITH DIFFERENTIAL/PLATELET
Abs Immature Granulocytes: 0 10*3/uL (ref 0.00–0.07)
BASOS ABS: 0 10*3/uL (ref 0.0–0.1)
Band Neutrophils: 3 %
Basophils Relative: 0 %
EOS PCT: 0 %
Eosinophils Absolute: 0 10*3/uL (ref 0.0–0.5)
HCT: 38.1 % (ref 36.0–46.0)
Hemoglobin: 12.3 g/dL (ref 12.0–15.0)
LYMPHS PCT: 51 %
Lymphs Abs: 6.9 10*3/uL — ABNORMAL HIGH (ref 0.7–4.0)
MCH: 28.8 pg (ref 26.0–34.0)
MCHC: 32.3 g/dL (ref 30.0–36.0)
MCV: 89.2 fL (ref 80.0–100.0)
Monocytes Absolute: 0.1 10*3/uL (ref 0.1–1.0)
Monocytes Relative: 1 %
Neutro Abs: 6.5 10*3/uL (ref 1.7–17.7)
Neutrophils Relative %: 45 %
Platelets: 95 10*3/uL — ABNORMAL LOW (ref 150–400)
RBC: 4.27 MIL/uL (ref 3.87–5.11)
RDW: 13.9 % (ref 11.5–15.5)
WBC: 13.6 10*3/uL — ABNORMAL HIGH (ref 4.0–10.5)
nRBC: 0 % (ref 0.0–0.2)

## 2018-04-09 LAB — COMPREHENSIVE METABOLIC PANEL
ALBUMIN: 3.7 g/dL (ref 3.5–5.0)
ALT: 11 U/L (ref 0–44)
AST: 17 U/L (ref 15–41)
Alkaline Phosphatase: 70 U/L (ref 38–126)
Anion gap: 8 (ref 5–15)
BUN: 13 mg/dL (ref 8–23)
CHLORIDE: 106 mmol/L (ref 98–111)
CO2: 25 mmol/L (ref 22–32)
CREATININE: 0.98 mg/dL (ref 0.44–1.00)
Calcium: 8.2 mg/dL — ABNORMAL LOW (ref 8.9–10.3)
GFR calc non Af Amer: 59 mL/min — ABNORMAL LOW (ref >60)
Glucose, Bld: 81 mg/dL (ref 70–99)
Potassium: 3.9 mmol/L (ref 3.5–5.1)
SODIUM: 139 mmol/L (ref 135–145)
Total Bilirubin: 1.1 mg/dL (ref 0.3–1.2)
Total Protein: 5.7 g/dL — ABNORMAL LOW (ref 6.5–8.1)

## 2018-04-09 LAB — LACTATE DEHYDROGENASE: LDH: 198 U/L — AB (ref 98–192)

## 2018-04-09 MED ORDER — HEPARIN SOD (PORK) LOCK FLUSH 100 UNIT/ML IV SOLN
500.0000 [IU] | Freq: Once | INTRAVENOUS | Status: AC
  Administered 2018-04-09: 500 [IU]
  Filled 2018-04-09: qty 5

## 2018-04-09 MED ORDER — SODIUM CHLORIDE 0.9% FLUSH
10.0000 mL | Freq: Once | INTRAVENOUS | Status: AC
  Administered 2018-04-09: 10 mL
  Filled 2018-04-09: qty 10

## 2018-04-11 LAB — BETA 2 MICROGLOBULIN, SERUM: BETA 2 MICROGLOBULIN: 3.3 mg/L — AB (ref 0.6–2.4)

## 2018-04-12 ENCOUNTER — Other Ambulatory Visit: Payer: Medicare Other

## 2018-04-20 ENCOUNTER — Other Ambulatory Visit: Payer: Self-pay | Admitting: *Deleted

## 2018-04-20 DIAGNOSIS — J019 Acute sinusitis, unspecified: Secondary | ICD-10-CM | POA: Diagnosis not present

## 2018-04-20 DIAGNOSIS — R509 Fever, unspecified: Secondary | ICD-10-CM | POA: Diagnosis not present

## 2018-04-20 MED ORDER — ACYCLOVIR 400 MG PO TABS: 400.0000 mg | ORAL_TABLET | Freq: Two times a day (BID) | ORAL | 3 refills | Status: DC

## 2018-05-03 DIAGNOSIS — J309 Allergic rhinitis, unspecified: Secondary | ICD-10-CM | POA: Diagnosis not present

## 2018-05-15 ENCOUNTER — Other Ambulatory Visit: Payer: Self-pay | Admitting: Allergy and Immunology

## 2018-05-25 ENCOUNTER — Ambulatory Visit: Payer: Medicare Other | Admitting: Obstetrics

## 2018-05-25 DIAGNOSIS — J45909 Unspecified asthma, uncomplicated: Secondary | ICD-10-CM | POA: Diagnosis not present

## 2018-05-25 DIAGNOSIS — C9110 Chronic lymphocytic leukemia of B-cell type not having achieved remission: Secondary | ICD-10-CM | POA: Diagnosis not present

## 2018-05-25 DIAGNOSIS — J399 Disease of upper respiratory tract, unspecified: Secondary | ICD-10-CM | POA: Diagnosis not present

## 2018-05-26 ENCOUNTER — Telehealth: Payer: Self-pay

## 2018-05-26 NOTE — Telephone Encounter (Signed)
Pt called to report that she has had a low grade fever and chills since Sunday evening.  She has been to her PCP and they are treating her for a sinus infections. She states no SHOB or CP but does have some wheezing.   She says her PCP has her on two medications.  I have instructed pt to call us if symptoms persist or get worse. Pt also reports that her last refill for Imbruvica got lost in the mail.  I called [facility] for her and verified that they did ship out another bottle to her yesterday and it will be delivered in 5-10 days.  Pt has been without medication since last Thursday.  I was able to obtain medication from our in house pharmacy supply to supplement pt until hers is delivered.  Pts husband is in route to pick it up.

## 2018-05-28 ENCOUNTER — Other Ambulatory Visit: Payer: Self-pay | Admitting: *Deleted

## 2018-05-28 MED ORDER — ALLOPURINOL 300 MG PO TABS: 300.0000 mg | ORAL_TABLET | Freq: Every day | ORAL | 3 refills | Status: DC

## 2018-07-08 ENCOUNTER — Telehealth: Payer: Self-pay | Admitting: Oncology

## 2018-07-08 NOTE — Telephone Encounter (Signed)
Left vm for pt to call back to try and convert appt for 07/12/18 into Web[facility] mtg.

## 2018-07-12 ENCOUNTER — Other Ambulatory Visit: Payer: Medicare Other

## 2018-07-12 ENCOUNTER — Inpatient Hospital Stay: Payer: Medicare Other | Admitting: Oncology

## 2018-07-13 ENCOUNTER — Ambulatory Visit (HOSPITAL_BASED_OUTPATIENT_CLINIC_OR_DEPARTMENT_OTHER): Payer: Medicare Other | Admitting: Oncology

## 2018-07-13 DIAGNOSIS — D696 Thrombocytopenia, unspecified: Secondary | ICD-10-CM

## 2018-07-13 DIAGNOSIS — C8300 Small cell B-cell lymphoma, unspecified site: Secondary | ICD-10-CM | POA: Diagnosis not present

## 2018-07-13 DIAGNOSIS — C9110 Chronic lymphocytic leukemia of B-cell type not having achieved remission: Secondary | ICD-10-CM | POA: Diagnosis not present

## 2018-07-13 MED ORDER — IBRUTINIB 140 MG PO CAPS: 420.0000 mg | ORAL_CAPSULE | Freq: Every day | ORAL | 4 refills | Status: DC

## 2018-07-13 MED ORDER — ACYCLOVIR 400 MG PO TABS: 400.0000 mg | ORAL_TABLET | Freq: Two times a day (BID) | ORAL | 3 refills | Status: DC

## 2018-07-13 NOTE — Progress Notes (Signed)
[facility]  [phone] [phone]) [phone]     ID: Toney Sang Quilling   DOB: 08/27/1950  MR#: [id number]  OIB#:[id number]  Patient Care Team: Leeroy Cha, MD as PCP - General (Internal Medicine) Julien Berryman, Virgie Dad, MD as Consulting Physician (Oncology) OTHER MD:  I connected with Rachel [name] on 07/13/18 at  3:00 PM EDT by video enabled telemedicine visit and verified that I am speaking with the correct person using two identifiers.   I discussed the limitations, risks, security and privacy concerns of performing an evaluation and management service by telemedicine and the availability of in-person appointments. I also discussed with the patient that there may be a patient responsible charge related to this service. The patient expressed understanding and agreed to proceed.   Other persons participating in the visit and their role in the encounter: Wilburn Mylar, scribe   Patient's location: home  Provider's location: [place]    CHIEF COMPLAINT: Chronic lymphocytic leukemia  CURRENT TREATMENT: Ibrutinib   INTERVAL HISTORY: Rachel [name] is seen today for follow-up and treatment of her chronic lymphocytic leukemia.   The patient continues on ibrutinib, which she tolerated okay. She reports easy bruising, red spots on her arm, and dry mouth.  She was due for mammography earlier this year, but her appointments were cancelled due to virus concerns. She notes this includes her in-person visit with her gynecologist.   REVIEW OF SYSTEMS: Rachel [name] reports doing well overall. She reports walking around her neighborhood and around the [employer] parking lot with her husband, [name]. When they go out, she wears her gloves and mask. She misses being able to go out, to hug her grandkids, and see people. She reports [name] does the grocery shopping    The patient denies unusual headaches, visual changes, nausea, vomiting, stiff neck, dizziness,  or gait imbalance. There has been no cough, phlegm production, or pleurisy, no chest pain or pressure, and no change in bowel or bladder habits. The patient denies fever, rash, bleeding, unexplained fatigue or unexplained weight loss. A detailed review of systems was otherwise entirely negative.   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 She has been treated with Rituxan, ofatumumab, cladribine, bendamustine and currently with ibrutinib, with details summarized below.    PAST MEDICAL HISTORY: Past Medical History:  Diagnosis Date  . Chronic lymphoblastic leukemia ([place]) 02/1998  . Diverticulitis   . Leukemia ([place])   . Lower back pain     PAST SURGICAL HISTORY: Past Surgical History:  Procedure Laterality Date  . BREAST SURGERY  2015   left breast bx  . BUNIONECTOMY     right foot  . lining of uterus removed    . OTHER SURGICAL HISTORY     removal of uterine lining  . TONSILLECTOMY      FAMILY HISTORY Family History  Problem Relation Age of Onset  . Depression Son     GYNECOLOGIC HISTORY: Menarche age 3, first live birth age 6, she is [name] P3. She stopped having periods in 1999 after endometrial stripping. She continues on hormone replacement.  SOCIAL HISTORY: [name] is retired from [employer]. Her son [name] lives in [place]; he works for a [employer]. A second son lives in [place] and has two children. Son [name] lives in [place]. She has 8 grandchildren, the oldest is 67 and the youngest is 63.  ADVANCED DIRECTIVES: In place  HEALTH  MAINTENANCE: Social History   Tobacco Use  . Smoking status: Never Smoker  . Smokeless tobacco: Never Used  Substance Use Topics  . Alcohol use: Yes    Alcohol/week: 0.0 standard drinks    Comment: socially  . Drug use: No     Colonoscopy: due  PAP: December 2012/ [name]  Bone density: Due   Lipid panel: per Dr Drema Dallas  Allergies  Allergen Reactions  . Penicillins Anaphylaxis    "stopped breathing" "drops my heartbeat" "I just pass out"    Current Outpatient Medications  Medication Sig Dispense Refill  . acetaminophen (TYLENOL) 500 MG tablet Take 500 mg by mouth every 6 (six) hours as needed for headache.    Marland Kitchen acyclovir (ZOVIRAX) 400 MG tablet Take 1 tablet (400 mg total) by mouth 2 (two) times daily. 180 tablet 3  . allopurinol (ZYLOPRIM) 300 MG tablet Take 1 tablet (300 mg total) by mouth daily. 90 tablet 3  . Calcium Citrate (CITRACAL PO) Take 4 tablets by mouth daily.     . cholecalciferol (VITAMIN D) 1000 UNITS tablet Take 4,000 Units by mouth daily.    . Estradiol (ELESTRIN) 0.52 MG/0.87 GM (0.06%) GEL APPLY 2 PUMPS THINLY TO UPPER ARM DAILY AT BEDTIME 6 Bottle 3  . fluticasone (FLONASE) 50 MCG/ACT nasal spray Place into both nostrils daily.  2  . guaiFENesin (MUCINEX) 600 MG 12 hr tablet Take 600 mg by mouth 2 (two) times daily as needed.    . IMBRUVICA 140 MG capsul TAKE 3 CAPSULES DAILY 270 capsule 4  . levocetirizine (XYZAL) 5 MG tablet Take 5 mg by mouth every evening.    . lidocaine-prilocaine (EMLA) cream Apply a nickel size amount to skin on top of port- cover with saran wrap at least 1 hour before access 30 g 0  . Magnesium 250 MG TABS Take 1 tablet by mouth daily.    Marland Kitchen omeprazole (PRILOSEC) 40 MG capsule   5  . PROAIR HFA 108 (90 Base) MCG/ACT inhaler USE 2 INHALATIONS EVERY 6 HOURS AS NEEDED FOR WHEEZING OR SHORTNESS OF BREATH 8.5 g 0  . progesterone (PROMETRIUM) 200 MG capsule Take 1 capsule (200 mg total) by mouth daily. 90 capsule 3   No current facility-administered medications for this visit.     OBJECTIVE: Middle-aged left American woman who appears well  There were no vitals filed for this visit. There were no vitals filed for this visit. There is no height or weight on file to calculate BMI.    ECOG FS: 1   LAB RESULTS:  Lab Results   Component Value Date   WBC 13.6 (H) 04/09/2018   NEUTROABS 6.5 04/09/2018   HGB 12.3 04/09/2018   HCT 38.1 04/09/2018   MCV 89.2 04/09/2018   PLT 95 (L) 04/09/2018      Chemistry      Component Value Date/Time   NA 139 04/09/2018 1224   NA 140 12/31/2016 1406   K 3.9 04/09/2018 1224   K 3.6 12/31/2016 1406   CL 106 04/09/2018 1224   CL 101 12/15/2011 1322   CO2 25 04/09/2018 1224   CO2 24 12/31/2016 1406   BUN 13 04/09/2018 1224   BUN 12.8 12/31/2016 1406   CREATININE 0.98 04/09/2018 1224   CREATININE 1.27 (H) 10/02/2017 1502   CREATININE 0.9 12/31/2016 1406      Component Value Date/Time   CALCIUM 8.2 (L) 04/09/2018 1224   CALCIUM 8.8 12/31/2016 1406   ALKPHOS 70 04/09/2018 1224  ALKPHOS 71 12/31/2016 1406   AST 17 04/09/2018 1224   AST 19 10/02/2017 1502   AST 18 12/31/2016 1406   ALT 11 04/09/2018 1224   ALT 13 10/02/2017 1502   ALT 11 12/31/2016 1406   BILITOT 1.1 04/09/2018 1224   BILITOT 0.6 10/02/2017 1502   BILITOT 0.49 12/31/2016 1406     STUDIES: No results found.   ASSESSMENT: 68 y.o.  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 rigors 02/27/2014: received IV vancomycin and levaquin briefly, then levaquin po (pcn allergy)  (6) bendamustine/ rituximab for 6 cycles, completed 05/04/2014, with improvement but not  normalization of the absolute lymphocyte count  (7) ibrutinib started 05/08/2014, currently at 420mg  daily. Stopped by patient in June, 2019 restarted in September, 2019.  (a) hepatitis B studies 03/06/2014 and 12/31/2016, negative  PLAN: Rachel [name] is now 17-1/2 years out from initial diagnosis of chronic lymphoid leukemia.  She is tolerating ibrutinib well aside from the usual mild bruising due to its aspirin-like effect.  She has not had any palpitation problems.  She is somewhat depressed because of not having access to her family which is so important to her course she is used to traveling and she cannot do that at this point.  This is temporary and will all live through it I expect  From a chronic lymphoid leukemia point of view currently she has no "B" symptoms and the plan is to continue the ibrutinib indefinitely or until there is definite evidence of progression  She will see me again in 3 months assuming things have improved by then as far as the pandemic is concerned  She knows to call for any other issue that may develop before then.   Minda Faas, Virgie Dad, MD  07/13/18 2:56 PM Medical Oncology and Hematology [facility] [address] [place], [place] [address] Tel. [phone]    Fax. [phone]   I, Wilburn Mylar, am acting as scribe for Dr. Virgie Dad. Kordelia Severin.  I, Lurline Del MD, have reviewed the above documentation for accuracy and completeness, and I agree with the above.

## 2018-07-27 ENCOUNTER — Encounter: Payer: Self-pay | Admitting: *Deleted

## 2018-08-19 ENCOUNTER — Other Ambulatory Visit: Payer: Self-pay | Admitting: *Deleted

## 2018-08-19 ENCOUNTER — Telehealth: Payer: Self-pay | Admitting: *Deleted

## 2018-08-19 DIAGNOSIS — Z0000 Encounter for general adult medical examination without abnormal findings: Secondary | ICD-10-CM

## 2018-08-19 DIAGNOSIS — Z1231 Encounter for screening mammogram for malignant neoplasm of breast: Secondary | ICD-10-CM

## 2018-08-19 NOTE — Telephone Encounter (Signed)
Rachel [name] called to this RN regarding concern due to recent visit with grandchildren this past weekend in [place] and was informed that " my 70 year old grandson was with friends who have a friend who has tested positive for Covid "  She states the grandson " kind of avoiding being directly with [place] overall "  This RN inquired when was her grandson possible exposed and Rachel [name] is unsure.  Presently her grandson has no symptoms and has not been tested.  This RN discussed with Rachel [name] s/s of concerns including her knowing her baseline status ( she has ongoing allergies with mild cough and sneezing , as well as GI issues ) and if these worsen or change she should call her primary MD. As well as if grandson becomes symptomatic and has to be tested that would be more of concern.  Rachel [name] stated understanding of above.

## 2018-08-23 ENCOUNTER — Other Ambulatory Visit: Payer: Medicare Other

## 2018-08-23 ENCOUNTER — Inpatient Hospital Stay: Payer: Medicare Other

## 2018-08-23 ENCOUNTER — Telehealth: Payer: Self-pay | Admitting: *Deleted

## 2018-08-23 ENCOUNTER — Other Ambulatory Visit: Payer: Self-pay | Admitting: *Deleted

## 2018-08-23 ENCOUNTER — Other Ambulatory Visit: Payer: Self-pay

## 2018-08-23 ENCOUNTER — Inpatient Hospital Stay: Payer: Medicare Other | Attending: Oncology | Admitting: Oncology

## 2018-08-23 VITALS — BP 99/70 | HR 65 | Temp 97.7°F | Resp 17 | Ht 58.5 in | Wt 118.8 lb

## 2018-08-23 DIAGNOSIS — Z7951 Long term (current) use of inhaled steroids: Secondary | ICD-10-CM | POA: Diagnosis not present

## 2018-08-23 DIAGNOSIS — C9110 Chronic lymphocytic leukemia of B-cell type not having achieved remission: Secondary | ICD-10-CM

## 2018-08-23 DIAGNOSIS — Z79899 Other long term (current) drug therapy: Secondary | ICD-10-CM

## 2018-08-23 DIAGNOSIS — C8300 Small cell B-cell lymphoma, unspecified site: Secondary | ICD-10-CM

## 2018-08-23 DIAGNOSIS — Z452 Encounter for adjustment and management of vascular access device: Secondary | ICD-10-CM | POA: Insufficient documentation

## 2018-08-23 DIAGNOSIS — R252 Cramp and spasm: Secondary | ICD-10-CM

## 2018-08-23 DIAGNOSIS — Z95828 Presence of other vascular implants and grafts: Secondary | ICD-10-CM

## 2018-08-23 LAB — CMP (CANCER CENTER ONLY)
ALT: 21 U/L (ref 0–44)
AST: 29 U/L (ref 15–41)
Albumin: 3.9 g/dL (ref 3.5–5.0)
Alkaline Phosphatase: 92 U/L (ref 38–126)
Anion gap: 9 (ref 5–15)
BUN: 12 mg/dL (ref 8–23)
CO2: 26 mmol/L (ref 22–32)
Calcium: 8.6 mg/dL — ABNORMAL LOW (ref 8.9–10.3)
Chloride: 106 mmol/L (ref 98–111)
Creatinine: 0.88 mg/dL (ref 0.44–1.00)
GFR, Est AFR Am: >60 mL/min (ref >60)
GFR, Estimated: >60 mL/min (ref >60)
Glucose, Bld: 97 mg/dL (ref 70–99)
Potassium: 4 mmol/L (ref 3.5–5.1)
Sodium: 141 mmol/L (ref 135–145)
Total Bilirubin: 0.6 mg/dL (ref 0.3–1.2)
Total Protein: 6.4 g/dL — ABNORMAL LOW (ref 6.5–8.1)

## 2018-08-23 LAB — LACTATE DEHYDROGENASE: LDH: 251 U/L — ABNORMAL HIGH (ref 98–192)

## 2018-08-23 LAB — CBC WITH DIFFERENTIAL (CANCER CENTER ONLY)
Abs Immature Granulocytes: 0.2 10*3/uL — ABNORMAL HIGH (ref 0.00–0.07)
Basophils Absolute: 0.1 10*3/uL (ref 0.0–0.1)
Basophils Relative: 1 %
Eosinophils Absolute: 0.2 10*3/uL (ref 0.0–0.5)
Eosinophils Relative: 1 %
HCT: 43.4 % (ref 36.0–46.0)
Hemoglobin: 13.9 g/dL (ref 12.0–15.0)
Immature Granulocytes: 1 %
Lymphocytes Relative: 56 %
Lymphs Abs: 9.5 10*3/uL — ABNORMAL HIGH (ref 0.7–4.0)
MCH: 28.9 pg (ref 26.0–34.0)
MCHC: 32 g/dL (ref 30.0–36.0)
MCV: 90.2 fL (ref 80.0–100.0)
Monocytes Absolute: 1.8 10*3/uL — ABNORMAL HIGH (ref 0.1–1.0)
Monocytes Relative: 10 %
Neutro Abs: 5.3 10*3/uL (ref 1.7–7.7)
Neutrophils Relative %: 31 %
Platelet Count: 120 10*3/uL — ABNORMAL LOW (ref 150–400)
RBC: 4.81 MIL/uL (ref 3.87–5.11)
RDW: 11.9 % (ref 11.5–15.5)
WBC Count: 17.1 10*3/uL — ABNORMAL HIGH (ref 4.0–10.5)
nRBC: 0 % (ref 0.0–0.2)

## 2018-08-23 LAB — MAGNESIUM: Magnesium: 2 mg/dL (ref 1.7–2.4)

## 2018-08-23 MED ORDER — HEPARIN SOD (PORK) LOCK FLUSH 100 UNIT/ML IV SOLN
500.0000 [IU] | Freq: Once | INTRAVENOUS | Status: AC
  Administered 2018-08-23: 500 [IU]
  Filled 2018-08-23: qty 5

## 2018-08-23 MED ORDER — SODIUM CHLORIDE 0.9% FLUSH
10.0000 mL | Freq: Once | INTRAVENOUS | Status: AC
  Administered 2018-08-23: 10 mL
  Filled 2018-08-23: qty 10

## 2018-08-23 NOTE — Progress Notes (Signed)
31

## 2018-08-23 NOTE — Progress Notes (Signed)
[facility]  Telephone:(336) [phone] Fax:(336) [phone]     ID: Rachel [name]   DOB: 10/16/1950  MR#: [id number]  QRF#:[id number]  Patient Care Team: Rachel Cha, MD as PCP - General (Internal Medicine) , Rachel Dad, MD as Consulting Physician (Oncology) OTHER MD:   CHIEF COMPLAINT: Chronic lymphocytic leukemia  CURRENT TREATMENT: Ibrutinib   INTERVAL HISTORY: Rachel [name] returns today for follow-up and treatment of her chronic lymphocytic leukemia.   She continues on ibrutinib.  She generally tolerates this well and specifically has had no increasing bruising or bleeding and no palpitations.  She denies any "B" symptoms.  She did have an episode in March where she had fevers up to 102 cough and diarrhea.  It is possible she had the virus at that time but she was not tested.  She does have 6 family members including her mother in [place] all of whom have the virus.  Only 1 of them is having significant problems from it.  Since her last visit here, she has not undergone any additional studies.     REVIEW OF SYSTEMS: Rachel [name] reports that she has been experiencing extreme craming in her fingers that is extending up to her elbow. She is only getting mild relief from Tylenol. She is also experiencing cramping pain in her calves and toes. She feels nervous about going to her primary care physician currently due to the pandemic. The patient denies unusual headaches, visual changes, nausea, vomiting, or dizziness. There has been no unusual cough, phlegm production, or pleurisy. This been no change in bowel or bladder habits. The patient denies unexplained fatigue or unexplained weight loss, bleeding, rash, or fever. A detailed review of systems was otherwise noncontributory.    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 She has been treated with  Rituxan, ofatumumab, cladribine, bendamustine and currently with ibrutinib, with details summarized below.   PAST MEDICAL HISTORY: Past Medical History:  Diagnosis Date  . Chronic lymphoblastic leukemia ([place]) 02/1998  . Diverticulitis   . Leukemia ([place])   . Lower back pain     PAST SURGICAL HISTORY: Past Surgical History:  Procedure Laterality Date  . BREAST SURGERY  2015   left breast bx  . BUNIONECTOMY     right foot  . lining of uterus removed    . OTHER SURGICAL HISTORY     removal of uterine lining  . TONSILLECTOMY      FAMILY HISTORY Family History  Problem Relation Age of Onset  . Depression Son     GYNECOLOGIC HISTORY: Menarche age 73, first live birth age 12, she is [place] P3. She stopped having periods in 1999 after endometrial stripping. She continues on hormone replacement.  SOCIAL HISTORY: Her husband Rachel [name] is retired from [employer]. Her son Rachel [name] lives in [place]; he works for a [employer]. A second son lives in [place] and has two children. Son Rachel [name] lives in [place]. She has 8 grandchildren, the oldest is 51 and the youngest is 11.  ADVANCED DIRECTIVES: In place  HEALTH MAINTENANCE: Social History   Tobacco Use  . Smoking status: Never Smoker  . Smokeless tobacco: Never Used  Substance Use Topics  . Alcohol use: Yes    Alcohol/week: 0.0 standard drinks    Comment: socially  . Drug use: No     Colonoscopy: due  PAP: December 2012/ [name]  Bone density: Due  Lipid panel: per Dr Drema Dallas  Allergies  Allergen Reactions  . Penicillins Anaphylaxis    "stopped breathing" "drops my heartbeat" "I just pass out"    Current Outpatient Medications  Medication Sig Dispense Refill  . acetaminophen (TYLENOL) 500 MG tablet Take 500 mg by mouth every 6 (six) hours as needed for headache.    Marland Kitchen acyclovir (ZOVIRAX) 400 MG tablet Take 1 tablet (400 mg total) by mouth 2 (two) times daily. 180 tablet 3  . allopurinol (ZYLOPRIM) 300 MG tablet Take  1 tablet (300 mg total) by mouth daily. 90 tablet 3  . Calcium Citrate (CITRACAL PO) Take 4 tablets by mouth daily.     . cholecalciferol (VITAMIN D) 1000 UNITS tablet Take 4,000 Units by mouth daily.    . Estradiol (ELESTRIN) 0.52 MG/0.87 GM (0.06%) GEL APPLY 2 PUMPS THINLY TO UPPER ARM DAILY AT BEDTIME 6 Bottle 3  . fluticasone (FLONASE) 50 MCG/ACT nasal spray Place into both nostrils daily.  2  . guaiFENesin (MUCINEX) 600 MG 12 hr tablet Take 600 mg by mouth 2 (two) times daily as needed.    . ibrutinib (IMBRUVICA) 140 MG capsul Take 3 capsules (420 mg total) by mouth daily. 270 capsule 4  . levocetirizine (XYZAL) 5 MG tablet Take 5 mg by mouth every evening.    . lidocaine-prilocaine (EMLA) cream Apply a nickel size amount to skin on top of port- cover with saran wrap at least 1 hour before access 30 g 0  . Magnesium 250 MG TABS Take 1 tablet by mouth daily.    Marland Kitchen omeprazole (PRILOSEC) 40 MG capsule   5  . PROAIR HFA 108 (90 Base) MCG/ACT inhaler USE 2 INHALATIONS EVERY 6 HOURS AS NEEDED FOR WHEEZING OR SHORTNESS OF BREATH 8.5 g 0  . progesterone (PROMETRIUM) 200 MG capsule Take 1 capsule (200 mg total) by mouth daily. 90 capsule 3   No current facility-administered medications for this visit.     OBJECTIVE: Middle-aged left American woman in no acute distress Vitals:   08/23/18 1215  BP: 99/70  Pulse: 65  Resp: 17  Temp: 97.7 F (36.5 C)  SpO2: 99%   Filed Weights   08/23/18 1215  Weight: 118 lb 12.8 oz (53.9 kg)   Body mass index is 24.41 kg/m.    ECOG FS: 1  Sclerae unicteric, EOMs intact Wearing a mask No cervical or supraclavicular adenopathy Lungs no rales or rhonchi Heart regular rate and rhythm Abd soft, nontender, positive bowel sounds MSK no focal spinal tenderness, no upper extremity lymphedema; no significant movement restriction in her fingers Neuro: nonfocal, well oriented, appropriate affect Breasts: Deferred  LAB RESULTS:  Lab Results  Component Value  Date   WBC 17.1 (H) 08/23/2018   NEUTROABS 5.3 08/23/2018   HGB 13.9 08/23/2018   HCT 43.4 08/23/2018   MCV 90.2 08/23/2018   PLT 120 (L) 08/23/2018      Chemistry      Component Value Date/Time   NA 141 08/23/2018 1115   NA 140 12/31/2016 1406   K 4.0 08/23/2018 1115   K 3.6 12/31/2016 1406   CL 106 08/23/2018 1115   CL 101 12/15/2011 1322   CO2 26 08/23/2018 1115   CO2 24 12/31/2016 1406   BUN 12 08/23/2018 1115   BUN 12.8 12/31/2016 1406   CREATININE 0.88 08/23/2018 1115   CREATININE 0.9 12/31/2016 1406      Component Value Date/Time   CALCIUM 8.6 (L) 08/23/2018 1115   CALCIUM 8.8 12/31/2016 1406   ALKPHOS 92  08/23/2018 1115   ALKPHOS 71 12/31/2016 1406   AST 29 08/23/2018 1115   AST 18 12/31/2016 1406   ALT 21 08/23/2018 1115   ALT 11 12/31/2016 1406   BILITOT 0.6 08/23/2018 1115   BILITOT 0.49 12/31/2016 1406     STUDIES: No results found.   ASSESSMENT: 68 y.o.   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 rigors 02/27/2014: received IV vancomycin and levaquin briefly, then levaquin po (pcn allergy)  (6) bendamustine/ rituximab for 6 cycles, completed 05/04/2014, with improvement but not normalization of the absolute lymphocyte count  (7) ibrutinib started 05/08/2014, currently at 420mg  daily. Stopped by patient in June, 2019  restarted in September, 2019.  (a) hepatitis B studies 03/06/2014 and 12/31/2016, negative  PLAN: Rachel [name] is now a little over 18 years out from initial diagnosis of chronic lymphoid leukemia.  Her disease is well controlled on ibrutinib and she seems to tolerate the drug well.  I am not sure why she is having the symptoms she is having in her hands.  This and to me more like carpal tunnel anything else.  I suggested that she gets wrist splints bilaterally and wear them for 2 weeks.  If she does not have any significant relief from that I will refer her to a hand surgeon for further evaluation  She has had significant coronavirus in her family.  She really should not be going to [place] to help because she will only get the disease and given her immunosuppressed state that would not be a good development.  She will return to see me in August.  She knows to call for any other issues that may develop before then.   , Rachel Dad, MD  08/23/18 12:33 PM Medical Oncology and Hematology [facility] [address] [place], [place] [address] Tel. [phone]    Fax. [phone]  I, Jacqualyn Posey am acting as a Education administrator for Chauncey Cruel, MD.   I, Lurline Del MD, have reviewed the above documentation for accuracy and completeness, and I agree with the above.

## 2018-08-23 NOTE — Telephone Encounter (Signed)
This RN spoke with pt per VM left - Rachel [name] states- yesterday- she she had " severe cramping in the middle finger of both my hands".  She states she has general cramps in her fingers, calves and toes " but this was the worse ever- and so bad - the fingers cramped and if I opened by hand all the way the pain went all the way up to my elbow "  She states she spoke with her sister ( who is a Marine scientist ) " who helped calm me down because you know I get very anxious "  She also took tylenol.  Pain diminished slowly but having residual discomfort.  She is also having some chills which are new since March.  Per med list pt is taking supplemental calcium and magnesium.   Above discussed with Richanda wanting to have labs checked but also is concerned " about going into a doctor's office"  She states she feels safer coming in to this office vs her primary MD.  Appointments made for today including port flush for lab draw per last flush was Feb 2020( delayed per Covid ).

## 2018-08-24 ENCOUNTER — Telehealth: Payer: Self-pay | Admitting: Oncology

## 2018-08-24 NOTE — Telephone Encounter (Signed)
I talk with patient regarding schedule  

## 2018-09-28 ENCOUNTER — Other Ambulatory Visit: Payer: Self-pay | Admitting: Obstetrics

## 2018-09-28 DIAGNOSIS — Z79890 Hormone replacement therapy (postmenopausal): Secondary | ICD-10-CM

## 2018-10-01 DIAGNOSIS — Z0000 Encounter for general adult medical examination without abnormal findings: Secondary | ICD-10-CM | POA: Diagnosis not present

## 2018-10-01 DIAGNOSIS — C9110 Chronic lymphocytic leukemia of B-cell type not having achieved remission: Secondary | ICD-10-CM | POA: Diagnosis not present

## 2018-10-01 DIAGNOSIS — K5730 Diverticulosis of large intestine without perforation or abscess without bleeding: Secondary | ICD-10-CM | POA: Diagnosis not present

## 2018-10-01 DIAGNOSIS — F3341 Major depressive disorder, recurrent, in partial remission: Secondary | ICD-10-CM | POA: Diagnosis not present

## 2018-10-01 DIAGNOSIS — Z23 Encounter for immunization: Secondary | ICD-10-CM | POA: Diagnosis not present

## 2018-10-01 DIAGNOSIS — Z1389 Encounter for screening for other disorder: Secondary | ICD-10-CM | POA: Diagnosis not present

## 2018-10-01 DIAGNOSIS — J45909 Unspecified asthma, uncomplicated: Secondary | ICD-10-CM | POA: Diagnosis not present

## 2018-10-01 DIAGNOSIS — J309 Allergic rhinitis, unspecified: Secondary | ICD-10-CM | POA: Diagnosis not present

## 2018-10-01 DIAGNOSIS — N289 Disorder of kidney and ureter, unspecified: Secondary | ICD-10-CM | POA: Diagnosis not present

## 2018-10-01 DIAGNOSIS — J0111 Acute recurrent frontal sinusitis: Secondary | ICD-10-CM | POA: Diagnosis not present

## 2018-10-04 ENCOUNTER — Telehealth: Payer: Self-pay

## 2018-10-04 NOTE — Telephone Encounter (Signed)
Pt lvm stating her PCP was referring her to a specialist for her kidneys. This RN returned call to get more information.  LVM for pt to call back.

## 2018-10-05 DIAGNOSIS — Z1231 Encounter for screening mammogram for malignant neoplasm of breast: Secondary | ICD-10-CM | POA: Diagnosis not present

## 2018-10-06 DIAGNOSIS — R21 Rash and other nonspecific skin eruption: Secondary | ICD-10-CM | POA: Diagnosis not present

## 2018-10-06 DIAGNOSIS — C9110 Chronic lymphocytic leukemia of B-cell type not having achieved remission: Secondary | ICD-10-CM | POA: Diagnosis not present

## 2018-10-13 DIAGNOSIS — C9110 Chronic lymphocytic leukemia of B-cell type not having achieved remission: Secondary | ICD-10-CM | POA: Diagnosis not present

## 2018-10-13 DIAGNOSIS — K219 Gastro-esophageal reflux disease without esophagitis: Secondary | ICD-10-CM | POA: Diagnosis not present

## 2018-10-13 DIAGNOSIS — N179 Acute kidney failure, unspecified: Secondary | ICD-10-CM | POA: Diagnosis not present

## 2018-10-13 DIAGNOSIS — N183 Chronic kidney disease, stage 3 (moderate): Secondary | ICD-10-CM | POA: Diagnosis not present

## 2018-10-13 DIAGNOSIS — N390 Urinary tract infection, site not specified: Secondary | ICD-10-CM | POA: Diagnosis not present

## 2018-10-14 DIAGNOSIS — L308 Other specified dermatitis: Secondary | ICD-10-CM | POA: Diagnosis not present

## 2018-10-18 ENCOUNTER — Inpatient Hospital Stay: Payer: Medicare Other

## 2018-10-19 DIAGNOSIS — J069 Acute upper respiratory infection, unspecified: Secondary | ICD-10-CM | POA: Diagnosis not present

## 2018-10-20 ENCOUNTER — Inpatient Hospital Stay: Payer: Medicare Other | Admitting: Oncology

## 2018-10-22 ENCOUNTER — Other Ambulatory Visit: Payer: Self-pay

## 2018-10-22 ENCOUNTER — Emergency Department (HOSPITAL_COMMUNITY): Payer: Medicare Other

## 2018-10-22 ENCOUNTER — Encounter (HOSPITAL_COMMUNITY): Payer: Self-pay | Admitting: Emergency Medicine

## 2018-10-22 ENCOUNTER — Encounter: Payer: Self-pay | Admitting: Oncology

## 2018-10-22 ENCOUNTER — Telehealth: Payer: Self-pay | Admitting: *Deleted

## 2018-10-22 ENCOUNTER — Emergency Department (HOSPITAL_COMMUNITY)
Admission: EM | Admit: 2018-10-22 | Discharge: 2018-10-22 | Disposition: A | Discharge status: Home / Self Care | Payer: Medicare Other | Attending: Emergency Medicine | Admitting: Emergency Medicine

## 2018-10-22 DIAGNOSIS — R161 Splenomegaly, not elsewhere classified: Secondary | ICD-10-CM | POA: Diagnosis not present

## 2018-10-22 DIAGNOSIS — R509 Fever, unspecified: Secondary | ICD-10-CM | POA: Insufficient documentation

## 2018-10-22 DIAGNOSIS — Z79899 Other long term (current) drug therapy: Secondary | ICD-10-CM | POA: Diagnosis not present

## 2018-10-22 DIAGNOSIS — K5792 Diverticulitis of intestine, part unspecified, without perforation or abscess without bleeding: Secondary | ICD-10-CM | POA: Diagnosis not present

## 2018-10-22 DIAGNOSIS — R1032 Left lower quadrant pain: Secondary | ICD-10-CM | POA: Diagnosis present

## 2018-10-22 LAB — COMPREHENSIVE METABOLIC PANEL
ALT: 80 U/L — ABNORMAL HIGH (ref 0–44)
AST: 56 U/L — ABNORMAL HIGH (ref 15–41)
Albumin: 3.2 g/dL — ABNORMAL LOW (ref 3.5–5.0)
Alkaline Phosphatase: 221 U/L — ABNORMAL HIGH (ref 38–126)
Anion gap: 8 (ref 5–15)
BUN: 10 mg/dL (ref 8–23)
CO2: 27 mmol/L (ref 22–32)
Calcium: 8.5 mg/dL — ABNORMAL LOW (ref 8.9–10.3)
Chloride: 105 mmol/L (ref 98–111)
Creatinine, Ser: 0.83 mg/dL (ref 0.44–1.00)
GFR calc Af Amer: >60 mL/min (ref >60)
GFR calc non Af Amer: >60 mL/min (ref >60)
Glucose, Bld: 92 mg/dL (ref 70–99)
Potassium: 3.3 mmol/L — ABNORMAL LOW (ref 3.5–5.1)
Sodium: 140 mmol/L (ref 135–145)
Total Bilirubin: 0.6 mg/dL (ref 0.3–1.2)
Total Protein: 5.9 g/dL — ABNORMAL LOW (ref 6.5–8.1)

## 2018-10-22 LAB — CBC WITH DIFFERENTIAL/PLATELET
Abs Immature Granulocytes: 0.17 10*3/uL — ABNORMAL HIGH (ref 0.00–0.07)
Basophils Absolute: 0 10*3/uL (ref 0.0–0.1)
Basophils Relative: 1 %
Eosinophils Absolute: 0 10*3/uL (ref 0.0–0.5)
Eosinophils Relative: 1 %
HCT: 35.8 % — ABNORMAL LOW (ref 36.0–46.0)
Hemoglobin: 11.4 g/dL — ABNORMAL LOW (ref 12.0–15.0)
Immature Granulocytes: 5 %
Lymphocytes Relative: 34 %
Lymphs Abs: 1.3 10*3/uL (ref 0.7–4.0)
MCH: 28.7 pg (ref 26.0–34.0)
MCHC: 31.8 g/dL (ref 30.0–36.0)
MCV: 90.2 fL (ref 80.0–100.0)
Monocytes Absolute: 1 10*3/uL (ref 0.1–1.0)
Monocytes Relative: 26 %
Neutro Abs: 1.3 10*3/uL — ABNORMAL LOW (ref 1.7–7.7)
Neutrophils Relative %: 33 %
Platelets: 50 10*3/uL — ABNORMAL LOW (ref 150–400)
RBC: 3.97 MIL/uL (ref 3.87–5.11)
RDW: 13.5 % (ref 11.5–15.5)
WBC: 3.8 10*3/uL — ABNORMAL LOW (ref 4.0–10.5)
nRBC: 0 % (ref 0.0–0.2)

## 2018-10-22 LAB — LIPASE, BLOOD: Lipase: 30 U/L (ref 11–51)

## 2018-10-22 MED ORDER — ONDANSETRON HCL 4 MG/2ML IJ SOLN: 4.0000 mg | Freq: Once | INTRAMUSCULAR | Status: DC

## 2018-10-22 MED ORDER — CIPROFLOXACIN HCL 500 MG PO TABS: 500.0000 mg | ORAL_TABLET | Freq: Two times a day (BID) | ORAL | 0 refills | Status: DC

## 2018-10-22 MED ORDER — PROMETHAZINE HCL 25 MG PO TABS: 25.0000 mg | ORAL_TABLET | Freq: Four times a day (QID) | ORAL | 0 refills | Status: DC | PRN

## 2018-10-22 MED ORDER — TRAMADOL HCL 50 MG PO TABS: 50.0000 mg | ORAL_TABLET | Freq: Four times a day (QID) | ORAL | 0 refills | Status: DC | PRN

## 2018-10-22 MED ORDER — SODIUM CHLORIDE (PF) 0.9 % IJ SOLN
INTRAMUSCULAR | Status: AC
  Filled 2018-10-22: qty 50

## 2018-10-22 MED ORDER — IOHEXOL 300 MG/ML  SOLN
100.0000 mL | Freq: Once | INTRAMUSCULAR | Status: AC | PRN
  Administered 2018-10-22: 14:00:00 100 mL via INTRAVENOUS

## 2018-10-22 MED ORDER — METRONIDAZOLE 500 MG PO TABS: 500.0000 mg | ORAL_TABLET | Freq: Two times a day (BID) | ORAL | 0 refills | Status: DC

## 2018-10-22 MED ORDER — MORPHINE SULFATE (PF) 4 MG/ML IV SOLN: 4.0000 mg | Freq: Once | INTRAVENOUS | Status: DC

## 2018-10-22 NOTE — Discharge Instructions (Addendum)
Follow-up with your oncologist.  Return here as needed.

## 2018-10-22 NOTE — ED Triage Notes (Signed)
Pt c/o diverticulitis since Saturday. Reports that she's been having abd pains, will have loose BMs then episodes of no BMs. This morning had pains when having BM denies pains at this time.  reports on Monday finished antibiotics she was taking for itching.

## 2018-10-22 NOTE — ED Provider Notes (Signed)
[facility] DEPT Provider Note   CSN: [id number] Arrival date & time: 10/22/18  0915     History   Chief Complaint Chief Complaint  Patient presents with   Abdominal Pain    HPI Rachel [name] is a 68 y.o. female.     HPI Patient presents to the emergency department with abdominal pain that started 3 days ago.  The patient states she is also had fevers in that timeframe.  Patient states that nothing seems to make the condition better but palpation makes her abdominal pain worse.  Patient was treated for Covid and was negative.  The patient denies chest pain, shortness of breath, headache,blurred vision, neck pain,cough, weakness, numbness, dizziness, anorexia, edema, diarrhea, rash, back pain, dysuria, hematemesis, bloody stool, near syncope, or syncope. 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   Bruising 05/12/2016   Thrombocytopenia ([place]) 05/12/2016   Port catheter in place 10/19/2015   Dehydration 06/26/2015   Transaminitis 06/26/2015   Nausea with vomiting 06/26/2015   Diverticulitis 06/25/2015   Vaginal discharge 01/31/2014   Low grade squamous intraepithelial lesion (LGSIL) on Papanicolaou smear of cervix 01/31/2014   Headache disorder 01/05/2014   CLL (chronic lymphocytic leukemia) ([place]) 07/17/2011    Past Surgical History:  Procedure Laterality Date   BREAST SURGERY  2015   left breast bx   BUNIONECTOMY     right foot   lining of uterus removed     OTHER SURGICAL HISTORY     removal of uterine lining   TONSILLECTOMY       OB History    Gravida  3   Para      Term      Preterm      AB      Living  3     SAB      TAB      Ectopic      Multiple      Live Births  3            Home Medications    Prior to Admission medications   Medication Sig Start Date End Date Taking? Authorizing Provider  acetaminophen (TYLENOL) 500 MG tablet Take 500-1,000 mg by mouth every 6 (six) hours as needed for headache.    Yes [provider]  acyclovir (ZOVIRAX) 400 MG tablet Take 1 tablet (400 mg total) by mouth 2 (two) times daily. 07/13/18  Yes Magrinat, Virgie Dad, MD  allopurinol (ZYLOPRIM) 300 MG tablet Take 1 tablet (300 mg total) by mouth daily. 05/28/18  Yes Magrinat, Virgie Dad, MD  Estradiol (ELESTRIN) 0.52 MG/0.87 GM (0.06%) GEL APPLY 2 PUMPS THINLY TO UPPER ARM DAILY AT BEDTIME 08/04/17  Yes [name], MD  ibrutinib (IMBRUVICA) 140 MG capsul Take 3 capsules (420 mg total) by mouth daily. 07/13/18  Yes Magrinat, Virgie Dad, MD  lidocaine-prilocaine (EMLA) cream Apply a nickel size amount to skin on top of port- cover with saran wrap at least 1 hour before access 05/07/16  Yes Magrinat, Virgie Dad, MD  PROAIR HFA 108 [phone] [place]) MCG/ACT inhaler USE 2 INHALATIONS EVERY 6 HOURS AS  NEEDED FOR WHEEZING OR SHORTNESS OF BREATH Patient taking differently: Inhale 2 puffs into the lungs every 6 (six) hours as needed for wheezing or shortness of breath.  05/17/18  Yes Bobbitt, Sedalia Muta, MD  progesterone (PROMETRIUM) 200 MG capsule Take 1 capsule (200 mg total) by mouth daily. 10/19/17  Yes [name], MD  traZODone (DESYREL) 100 MG tablet Take 100 mg by mouth at bedtime. 10/04/18  Yes [provider]    Family History Family History  Problem Relation Age of Onset   Depression Son     Social History Social History   Tobacco Use   Smoking status: Never Smoker   Smokeless tobacco: Never Used  Substance Use Topics   Alcohol use: Yes    Alcohol/week: 0.0 standard drinks    Comment: socially   Drug use: No      Allergies   Penicillins   Review of Systems Review of Systems All other systems negative except as documented in the HPI. All pertinent positives and negatives as reviewed in the HPI.  Physical Exam Updated Vital Signs BP 114/62 (BP Location: Left Arm)    Pulse 75    Temp 98.1 F (36.7 C) (Oral)    Resp 17    SpO2 99%   Physical Exam Vitals signs and nursing note reviewed.  Constitutional:      General: She is not in acute distress.    Appearance: She is well-developed.  HENT:     Head: Normocephalic and atraumatic.  Eyes:     Pupils: Pupils are equal, round, and reactive to light.  Neck:     Musculoskeletal: Normal range of motion and neck supple.  Cardiovascular:     Rate and Rhythm: Normal rate and regular rhythm.     Heart sounds: Normal heart sounds. No murmur. No friction rub. No gallop.   Pulmonary:     Effort: Pulmonary effort is normal. No respiratory distress.     Breath sounds: Normal breath sounds. No wheezing.  Abdominal:     General: Bowel sounds are normal. There is no distension.     Palpations: Abdomen is soft.     Tenderness: There is abdominal tenderness in the left lower quadrant.  Skin:    General: Skin is warm and dry.     Capillary Refill: Capillary refill takes less than 2 seconds.     Findings: No erythema or rash.  Neurological:     Mental Status: She is alert and oriented to person, place, and time.     Motor: No abnormal muscle tone.     Coordination: Coordination normal.  Psychiatric:        Behavior: Behavior normal.      ED Treatments / Results  Labs (all labs ordered are listed, but only abnormal results are displayed) Labs Reviewed  COMPREHENSIVE METABOLIC PANEL - Abnormal; Notable for the following components:      Result Value   Potassium 3.3 (*)    Calcium 8.5 (*)    Total Protein 5.9 (*)    Albumin 3.2 (*)    AST 56 (*)    ALT 80 (*)    Alkaline Phosphatase 221 (*)    All other components within normal limits   CBC WITH DIFFERENTIAL/PLATELET - Abnormal; Notable for the following components:   WBC 3.8 (*)    Hemoglobin 11.4 (*)    HCT 35.8 (*)    Platelets 50 (*)    Neutro Abs 1.3 (*)    Abs Immature  Granulocytes 0.17 (*)    All other components within normal limits  LIPASE, BLOOD  URINALYSIS, ROUTINE W REFLEX MICROSCOPIC    EKG None  Radiology Ct Abdomen Pelvis W Contrast  Result Date: 10/22/2018 CLINICAL DATA:  Abdominal pain and fever.  History of leukemia EXAM: CT ABDOMEN AND PELVIS WITH CONTRAST TECHNIQUE: Multidetector CT imaging of the abdomen and pelvis was performed using the standard protocol following bolus administration of intravenous contrast. CONTRAST:  150mL OMNIPAQUE IOHEXOL 300 MG/ML  SOLN COMPARISON:  February 08, 2016 FINDINGS: Lower chest: Lung bases are clear. There are foci of coronary artery calcification. Hepatobiliary: No focal liver lesions are evident. The gallbladder wall is not appreciably thickened. There is no biliary duct dilatation. Pancreas: There is no pancreatic mass or inflammatory focus. Spleen: Spleen measures 16.1 x 16.9 x 10.4 cm with a measured splenic volume of 1,4 no focal splenic lesions are demonstrable. 15 cubic cm. Adrenals/Urinary Tract: Adrenals appear normal bilaterally. Kidneys bilaterally show no evident hydronephrosis on either side. There is a cyst in the medial mid left kidney measuring 1.1 x 0.7 cm. There is no evident renal or ureteral calculus on either side. Urinary bladder is midline with wall thickness within normal limits. Stomach/Bowel: There is wall thickening throughout the mid to distal sigmoid colon. There are multiple diverticula in this area, with surrounding mesenteric stranding and mild fluid. Note that there is inflammation of several epiploic appendages in the sigmoid colon. There is no abscess or perforation in the sigmoid colon region. Elsewhere there are scattered diverticula in the ascending and descending colon without  inflammatory changes. There is no bowel obstruction. Terminal ileum appears unremarkable. No free air or portal venous air. Vascular/Lymphatic: There is aortic and iliac artery atherosclerosis. No aneurysm evident. There are multiple retroperitoneal lymph nodes, most subcentimeter. There is a lymph node located between the lower pole left kidney and the aorta which measures 2.1 x 1.8 cm. No adenopathy is noted elsewhere. Inguinal lymph nodes are upper normal in size and contain fatty hila, suggesting benign etiology. Reproductive: Uterus is anteverted.  No pelvic mass is demonstrable. Other: Appendix appears normal. No abscess or ascites is evident in the abdomen or pelvis. Musculoskeletal: There is degenerative change in the lower lumbar spine. There are no blastic or lytic bone lesions. No intramuscular or abdominal wall lesions are noted. IMPRESSION: 1. Splenomegaly. Retroperitoneal adenopathy, more severe on the left than on the right. Neoplastic etiology must be of concern. Patient reportedly has history of leukemia; recurrence of leukemia could account for these findings. Lymphoma is a differential consideration. Note that the spleen is significantly larger than on most recent study from December 2017. 2. Sigmoid inflammation, likely due to diverticulitis. There is also inflammation of epiploic appendages in this area. There may be a degree of concomitant epiploic appendagitis. No abscess or perforation evident in this area. Elsewhere there are scattered colonic diverticula without inflammation. 3. No bowel obstruction. No abscess elsewhere in the abdomen or pelvis. Appendix appears normal. 4. There is aortoiliac atherosclerosis. There are foci of coronary artery calcification. Electronically Signed   By: Lowella Grip III M.D.   On: 10/22/2018 14:12    Procedures Procedures (including critical care time)  Medications Ordered in ED Medications  sodium chloride (PF) 0.9 % injection (has no  administration in time range)  morphine 4 MG/ML injection 4 mg (has no administration in time range)  ondansetron (ZOFRAN) injection 4 mg (has no administration in time range)  iohexol (OMNIPAQUE) 300 MG/ML solution 100  mL (100 mLs Intravenous Contrast Given 10/22/18 1345)     Initial Impression / Assessment and Plan / ED Course  I have reviewed the triage vital signs and the nursing notes.  Pertinent labs & imaging results that were available during my care of the patient were reviewed by me and considered in my medical decision making (see chart for details).        Patient declines admission to the hospital.  She states she would rather follow-up with her oncologist and take oral antibiotics at home.  Advised patient to return for any worsening in her condition.  Patient agrees the plan and all questions were answered. Final Clinical Impressions(s) / ED Diagnoses   Final diagnoses:  Diverticulitis    ED Discharge Orders    None       Dalia Heading, PA-C 10/22/18 1519    Maudie Flakes, MD 10/28/18 [phone]

## 2018-10-22 NOTE — Telephone Encounter (Signed)
This RN returned VM left this AM by pt stating per her contact with GI MD per ongoing abd issues and low grade fevers - and concern for diverticulitis flair - she has been recommended to proceed to the ER ( primarily due to pt was COVID tested earlier this week for fevers ).  Rachel [name]'s inquiry is " is it safe for me to go to the ER ?"  Per return call - this RN obtained identified VM- message left informing pt ER is safe - pt needs to wear mask and follow social distance guidelines. This RN also recommended for pt to go ASAP - per possible increase in patients coming in as the day progresses.

## 2018-10-25 ENCOUNTER — Other Ambulatory Visit: Payer: Self-pay | Admitting: Oncology

## 2018-11-02 DIAGNOSIS — K5730 Diverticulosis of large intestine without perforation or abscess without bleeding: Secondary | ICD-10-CM | POA: Diagnosis not present

## 2018-11-02 DIAGNOSIS — Z1211 Encounter for screening for malignant neoplasm of colon: Secondary | ICD-10-CM | POA: Diagnosis not present

## 2018-11-02 DIAGNOSIS — R194 Change in bowel habit: Secondary | ICD-10-CM | POA: Diagnosis not present

## 2018-11-02 DIAGNOSIS — R110 Nausea: Secondary | ICD-10-CM | POA: Diagnosis not present

## 2018-11-03 ENCOUNTER — Telehealth: Payer: Self-pay | Admitting: Oncology

## 2018-11-03 NOTE — Telephone Encounter (Signed)
Returned patient's phone call regarding rescheduling a cancelled appointment, per patient's request appointments from August has been rescheduled for 09/16 and 09/21.

## 2018-11-05 DIAGNOSIS — D630 Anemia in neoplastic disease: Secondary | ICD-10-CM | POA: Diagnosis not present

## 2018-11-05 DIAGNOSIS — F3341 Major depressive disorder, recurrent, in partial remission: Secondary | ICD-10-CM | POA: Diagnosis not present

## 2018-11-05 DIAGNOSIS — J45909 Unspecified asthma, uncomplicated: Secondary | ICD-10-CM | POA: Diagnosis not present

## 2018-11-10 ENCOUNTER — Inpatient Hospital Stay: Payer: Medicare Other | Attending: Oncology

## 2018-11-10 ENCOUNTER — Other Ambulatory Visit: Payer: Self-pay

## 2018-11-10 DIAGNOSIS — I708 Atherosclerosis of other arteries: Secondary | ICD-10-CM | POA: Diagnosis not present

## 2018-11-10 DIAGNOSIS — R161 Splenomegaly, not elsewhere classified: Secondary | ICD-10-CM | POA: Diagnosis not present

## 2018-11-10 DIAGNOSIS — Z79899 Other long term (current) drug therapy: Secondary | ICD-10-CM | POA: Diagnosis not present

## 2018-11-10 DIAGNOSIS — Z9225 Personal history of immunosupression therapy: Secondary | ICD-10-CM | POA: Insufficient documentation

## 2018-11-10 DIAGNOSIS — R590 Localized enlarged lymph nodes: Secondary | ICD-10-CM | POA: Insufficient documentation

## 2018-11-10 DIAGNOSIS — C9110 Chronic lymphocytic leukemia of B-cell type not having achieved remission: Secondary | ICD-10-CM

## 2018-11-10 DIAGNOSIS — R109 Unspecified abdominal pain: Secondary | ICD-10-CM | POA: Diagnosis not present

## 2018-11-10 LAB — CBC WITH DIFFERENTIAL/PLATELET
Abs Immature Granulocytes: 0.19 10*3/uL — ABNORMAL HIGH (ref 0.00–0.07)
Basophils Absolute: 0.1 10*3/uL (ref 0.0–0.1)
Basophils Relative: 0 %
Eosinophils Absolute: 0.1 10*3/uL (ref 0.0–0.5)
Eosinophils Relative: 1 %
HCT: 37.6 % (ref 36.0–46.0)
Hemoglobin: 12 g/dL (ref 12.0–15.0)
Immature Granulocytes: 1 %
Lymphocytes Relative: 79 %
Lymphs Abs: 17.9 10*3/uL — ABNORMAL HIGH (ref 0.7–4.0)
MCH: 29.1 pg (ref 26.0–34.0)
MCHC: 31.9 g/dL (ref 30.0–36.0)
MCV: 91 fL (ref 80.0–100.0)
Monocytes Absolute: 1.4 10*3/uL — ABNORMAL HIGH (ref 0.1–1.0)
Monocytes Relative: 6 %
Neutro Abs: 3 10*3/uL (ref 1.7–7.7)
Neutrophils Relative %: 13 %
Platelets: 116 10*3/uL — ABNORMAL LOW (ref 150–400)
RBC: 4.13 MIL/uL (ref 3.87–5.11)
RDW: 13.5 % (ref 11.5–15.5)
WBC: 22.7 10*3/uL — ABNORMAL HIGH (ref 4.0–10.5)
nRBC: 0 % (ref 0.0–0.2)

## 2018-11-10 LAB — COMPREHENSIVE METABOLIC PANEL
ALT: 12 U/L (ref 0–44)
AST: 19 U/L (ref 15–41)
Albumin: 4 g/dL (ref 3.5–5.0)
Alkaline Phosphatase: 97 U/L (ref 38–126)
Anion gap: 7 (ref 5–15)
BUN: 23 mg/dL (ref 8–23)
CO2: 30 mmol/L (ref 22–32)
Calcium: 9.7 mg/dL (ref 8.9–10.3)
Chloride: 106 mmol/L (ref 98–111)
Creatinine, Ser: 1.12 mg/dL — ABNORMAL HIGH (ref 0.44–1.00)
GFR calc Af Amer: 58 mL/min — ABNORMAL LOW (ref >60)
GFR calc non Af Amer: 50 mL/min — ABNORMAL LOW (ref >60)
Glucose, Bld: 72 mg/dL (ref 70–99)
Potassium: 4.3 mmol/L (ref 3.5–5.1)
Sodium: 143 mmol/L (ref 135–145)
Total Bilirubin: 0.5 mg/dL (ref 0.3–1.2)
Total Protein: 6.2 g/dL — ABNORMAL LOW (ref 6.5–8.1)

## 2018-11-10 LAB — LACTATE DEHYDROGENASE: LDH: 196 U/L — ABNORMAL HIGH (ref 98–192)

## 2018-11-11 LAB — BETA 2 MICROGLOBULIN, SERUM: Beta-2 Microglobulin: 3.8 mg/L — ABNORMAL HIGH (ref 0.6–2.4)

## 2018-11-12 ENCOUNTER — Other Ambulatory Visit: Payer: Self-pay | Admitting: Oncology

## 2018-11-14 NOTE — Progress Notes (Signed)
[place]  [phone] [phone]     ID: Rachel [name]   DOB: April 21, 1950  MR#: [id number]  [id number]  Patient Care Team: Leeroy Cha, MD as PCP - General (Internal Medicine) Amee Boothe, Virgie Dad, MD as Consulting Physician (Oncology) OTHER MD:   CHIEF COMPLAINT: Chronic lymphocytic leukemia  CURRENT TREATMENT: Ibrutinib   INTERVAL HISTORY: Rachel [name] returns today for follow-up and treatment of her chronic lymphocytic leukemia. She was last seen here on 08/23/2018.   She continues on ibrutinib.  She generally tolerates this well, so far with no palpitations and no significant bruising.  She has had "insect bites" which however her husband has not had at home.  She has seen dermatology for this and was started on triamcinolone.  That is helping a little bit.  She also has symptoms consistent with diverticulitis.  She is scheduled for EGD and colonoscopy mid October under Dr. Collene Mares.    Since her last visit here, she underwent an abdomen and pelvis CT with contrast on 10/22/2018 for abdominal pain with fever showing: Splenomegaly. Retroperitoneal adenopathy, more severe on the left than on the right. Neoplastic etiology must be of concern. Patient reportedly has history of leukemia; recurrence of leukemia could account for these findings. Lymphoma is a differential consideration. Note that the spleen is significantly larger than on most recent study from December 2017. Sigmoid inflammation, likely due to diverticulitis. There is also inflammation of epiploic appendages in this area. There may be a degree of concomitant epiploic appendagitis. No abscess or perforation evident in this area. Elsewhere there are scattered colonic diverticula without inflammation. No bowel obstruction. No abscess elsewhere in the abdomen or pelvis. Appendix appears normal. There is aortoiliac atherosclerosis. There are foci of coronary artery  calcification.   REVIEW OF SYSTEMS: Rachel [name] is walking 3 or 4 miles most days.  She is not going to the gym at present.  Family is doing well.  They are taking appropriate pandemic precautions.  She denies fevers, drenching sweats, significant weight loss, or adenopathy.  The insect bites cause significant rashes and are very itchy.  She was treated with Cipro and Flagyl for the diverticular issues and tolerated those medications well.  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 She has been treated with Rituxan, ofatumumab, cladribine, bendamustine and currently with ibrutinib, with details summarized below.   PAST MEDICAL HISTORY: 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   Diverticulitis    Leukemia ([place])    Lower back pain     PAST SURGICAL HISTORY: Past Surgical History:  Procedure Laterality Date   BREAST SURGERY  2015   left breast bx   BUNIONECTOMY     right foot   lining of uterus removed     OTHER SURGICAL HISTORY     removal of uterine lining   TONSILLECTOMY      FAMILY HISTORY Family History  Problem Relation Age of Onset   Depression Son     GYNECOLOGIC HISTORY: Menarche age 11, first live birth age 68, she is [place] P3. She stopped having periods in 1999 after endometrial stripping. She continues on hormone replacement.  SOCIAL HISTORY: [name] is retired from [employer]. Her son [name] lives in [place]; he works for a [employer]. A second son lives in [place] and has  two children. Son [name] lives in [place]. She has 8 grandchildren, the oldest is 27 and the youngest is 34.  ADVANCED DIRECTIVES: In place  HEALTH MAINTENANCE: Social History   Tobacco Use   Smoking status: Never Smoker   Smokeless  tobacco: Never Used  Substance Use Topics   Alcohol use: Yes    Alcohol/week: 0.0 standard drinks    Comment: socially   Drug use: No     Colonoscopy: due  PAP: December 2012/ [name]  Bone density: Due  Lipid panel: per Dr Drema Dallas  Allergies  Allergen Reactions   Penicillins Anaphylaxis    "stopped breathing" "drops my heartbeat" "I just pass out"    Current Outpatient Medications  Medication Sig Dispense Refill   acetaminophen (TYLENOL) 500 MG tablet Take 500-1,000 mg by mouth every 6 (six) hours as needed for headache.      acyclovir (ZOVIRAX) 400 MG tablet Take 1 tablet (400 mg total) by mouth 2 (two) times daily. 180 tablet 3   allopurinol (ZYLOPRIM) 300 MG tablet Take 1 tablet (300 mg total) by mouth daily. 90 tablet 3   ciprofloxacin (CIPRO) 500 MG tablet Take 1 tablet (500 mg total) by mouth 2 (two) times daily. 20 tablet 0   Estradiol (ELESTRIN) 0.52 MG/0.87 GM (0.06%) GEL APPLY 2 PUMPS THINLY TO UPPER ARM DAILY AT BEDTIME 6 Bottle 3   ibrutinib (IMBRUVICA) 140 MG capsul Take 3 capsules (420 mg total) by mouth daily. 270 capsule 4   lidocaine-prilocaine (EMLA) cream Apply a nickel size amount to skin on top of port- cover with saran wrap at least 1 hour before access 30 g 0   metroNIDAZOLE (FLAGYL) 500 MG tablet Take 1 tablet (500 mg total) by mouth 2 (two) times daily. 20 tablet 0   PROAIR HFA 108 (90 Base) MCG/ACT inhaler USE 2 INHALATIONS EVERY 6 HOURS AS NEEDED FOR WHEEZING OR SHORTNESS OF BREATH (Patient taking differently: Inhale 2 puffs into the lungs every 6 (six) hours as needed for wheezing or shortness of breath. ) 8.5 g 0   progesterone (PROMETRIUM) 200 MG capsule Take 1 capsule (200 mg total) by mouth daily. 90 capsule 3   promethazine (PHENERGAN) 25 MG tablet Take 1 tablet (25 mg total) by mouth every 6 (six) hours as needed for nausea or vomiting. 10 tablet 0   traMADol (ULTRAM) 50 MG tablet Take 1 tablet (50 mg total) by mouth every 6 (six) hours  as needed for severe pain. 15 tablet 0   traZODone (DESYREL) 100 MG tablet Take 100 mg by mouth at bedtime.     No current facility-administered medications for this visit.     OBJECTIVE: Middle-aged left American woman who appears stated age  68:   11/15/18 0957  BP: (!) 101/51  Pulse: 73  Resp: 18  Temp: 97.8 F (36.6 C)  SpO2: 100%   Wt Readings from Last 3 Encounters:  11/15/18 114 lb 9.6 oz (52 kg)  08/23/18 118 lb 12.8 oz (53.9 kg)  01/07/18 113 lb 6.4 oz (51.4 kg)   Body mass index is 23.54 kg/m.    ECOG FS:1 - Symptomatic but completely ambulatory  Ocular: Sclerae unicteric, pupils round and equal Ear-nose-throat: Wearing a mask Lymphatic: No cervical or supraclavicular adenopathy Lungs no rales or rhonchi Heart regular rate and rhythm Abd soft, nontender, positive bowel sounds MSK no focal spinal tenderness, no joint edema Neuro: non-focal, well-oriented, appropriate affect Breasts: Deferred Skin: She has a significant rash in the right lower  arm, erythematous, confluent, part of it palpable.  LAB RESULTS:  Lab Results  Component Value Date   WBC 22.7 (H) 11/10/2018   NEUTROABS 3.0 11/10/2018   HGB 12.0 11/10/2018   HCT 37.6 11/10/2018   MCV 91.0 11/10/2018   PLT 116 (L) 11/10/2018      Chemistry      Component Value Date/Time   NA 143 11/10/2018 1119   NA 140 12/31/2016 1406   K 4.3 11/10/2018 1119   K 3.6 12/31/2016 1406   CL 106 11/10/2018 1119   CL 101 12/15/2011 1322   CO2 30 11/10/2018 1119   CO2 24 12/31/2016 1406   BUN 23 11/10/2018 1119   BUN 12.8 12/31/2016 1406   CREATININE 1.12 (H) 11/10/2018 1119   CREATININE 0.88 08/23/2018 1115   CREATININE 0.9 12/31/2016 1406      Component Value Date/Time   CALCIUM 9.7 11/10/2018 1119   CALCIUM 8.8 12/31/2016 1406   ALKPHOS 97 11/10/2018 1119   ALKPHOS 71 12/31/2016 1406   AST 19 11/10/2018 1119   AST 29 08/23/2018 1115   AST 18 12/31/2016 1406   ALT 12 11/10/2018 1119   ALT 21  08/23/2018 1115   ALT 11 12/31/2016 1406   BILITOT 0.5 11/10/2018 1119   BILITOT 0.6 08/23/2018 1115   BILITOT 0.49 12/31/2016 1406     STUDIES: Ct Abdomen Pelvis W Contrast  Result Date: 10/22/2018 CLINICAL DATA:  Abdominal pain and fever.  History of leukemia EXAM: CT ABDOMEN AND PELVIS WITH CONTRAST TECHNIQUE: Multidetector CT imaging of the abdomen and pelvis was performed using the standard protocol following bolus administration of intravenous contrast. CONTRAST:  177mL OMNIPAQUE IOHEXOL 300 MG/ML  SOLN COMPARISON:  February 08, 2016 FINDINGS: Lower chest: Lung bases are clear. There are foci of coronary artery calcification. Hepatobiliary: No focal liver lesions are evident. The gallbladder wall is not appreciably thickened. There is no biliary duct dilatation. Pancreas: There is no pancreatic mass or inflammatory focus. Spleen: Spleen measures 16.1 x 16.9 x 10.4 cm with a measured splenic volume of 1,4 no focal splenic lesions are demonstrable. 15 cubic cm. Adrenals/Urinary Tract: Adrenals appear normal bilaterally. Kidneys bilaterally show no evident hydronephrosis on either side. There is a cyst in the medial mid left kidney measuring 1.1 x 0.7 cm. There is no evident renal or ureteral calculus on either side. Urinary bladder is midline with wall thickness within normal limits. Stomach/Bowel: There is wall thickening throughout the mid to distal sigmoid colon. There are multiple diverticula in this area, with surrounding mesenteric stranding and mild fluid. Note that there is inflammation of several epiploic appendages in the sigmoid colon. There is no abscess or perforation in the sigmoid colon region. Elsewhere there are scattered diverticula in the ascending and descending colon without inflammatory changes. There is no bowel obstruction. Terminal ileum appears unremarkable. No free air or portal venous air. Vascular/Lymphatic: There is aortic and iliac artery atherosclerosis. No aneurysm  evident. There are multiple retroperitoneal lymph nodes, most subcentimeter. There is a lymph node located between the lower pole left kidney and the aorta which measures 2.1 x 1.8 cm. No adenopathy is noted elsewhere. Inguinal lymph nodes are upper normal in size and contain fatty hila, suggesting benign etiology. Reproductive: Uterus is anteverted.  No pelvic mass is demonstrable. Other: Appendix appears normal. No abscess or ascites is evident in the abdomen or pelvis. Musculoskeletal: There is degenerative change in the lower lumbar spine. There are no blastic or lytic bone lesions. No  intramuscular or abdominal wall lesions are noted. IMPRESSION: 1. Splenomegaly. Retroperitoneal adenopathy, more severe on the left than on the right. Neoplastic etiology must be of concern. Patient reportedly has history of leukemia; recurrence of leukemia could account for these findings. Lymphoma is a differential consideration. Note that the spleen is significantly larger than on most recent study from December 2017. 2. Sigmoid inflammation, likely due to diverticulitis. There is also inflammation of epiploic appendages in this area. There may be a degree of concomitant epiploic appendagitis. No abscess or perforation evident in this area. Elsewhere there are scattered colonic diverticula without inflammation. 3. No bowel obstruction. No abscess elsewhere in the abdomen or pelvis. Appendix appears normal. 4. There is aortoiliac atherosclerosis. There are foci of coronary artery calcification. Electronically Signed   By: Lowella Grip III M.D.   On: 10/22/2018 14:12     ASSESSMENT: 68 y.o.   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 rigors 02/27/2014: received IV vancomycin and levaquin briefly, then levaquin po (pcn allergy)  (6) bendamustine/ rituximab for 6 cycles, completed 05/04/2014, with improvement but not normalization of the absolute lymphocyte count  (7) ibrutinib started 05/08/2014, currently at 420mg  daily. Stopped by patient in June, 2019 restarted in September, 2019.  (a) hepatitis B studies 03/06/2014 and 12/31/2016, negative  (8) CT abd/pelvis 10/22/2018 shows retroperitoneal adenopathy, increased splenomegaly  (9) consider starting venetoclax 12/15/2018  PLAN: Rachel [name] is now more than 18 years out from initial diagnosis of chronic lymphoid leukemia.  She has done generally quite well and maintains a very good functional status.  She has been on ibrutinib on and off, mostly on, for approximately 4 years.  Generally she does well with this medication.  She now appears to be having disease progression.  Today we discussed switching to venetoclax.  She understands the main issue with this medication is the possibility of tumor lysis syndrome.  What we decided to do is continue ibrutinib for now.  She will go ahead and have her endoscopy studies under Dr. Collene Mares October 14.  On October 21 she will return here for labs and hopefully can start the venetoclax at that time.  She understands there is very close monitoring for the first 3 to 4 weeks after which follow-up is simplified.  The hope is that she will have a very good response and after a few months all medication can be stopped and we can simply begin observation  I am obtaining flow cytometry today which  will be her new baseline  She knows to call for any other issues that may develop before the next visit.  Emmersyn Kratzke, Virgie Dad, MD  11/15/18 10:28 AM Medical Oncology and Hematology [facility] [place], [place] [address] Tel. [phone]    Fax. [phone]  I, Jacqualyn Posey am acting as a Education administrator for Chauncey Cruel, MD.   I, Lurline Del MD, have reviewed the above documentation for  accuracy and completeness, and I agree with the above.

## 2018-11-15 ENCOUNTER — Inpatient Hospital Stay (HOSPITAL_BASED_OUTPATIENT_CLINIC_OR_DEPARTMENT_OTHER): Payer: Medicare Other | Admitting: Oncology

## 2018-11-15 ENCOUNTER — Inpatient Hospital Stay: Payer: Medicare Other

## 2018-11-15 ENCOUNTER — Telehealth: Payer: Self-pay | Admitting: Oncology

## 2018-11-15 ENCOUNTER — Other Ambulatory Visit: Payer: Self-pay

## 2018-11-15 VITALS — BP 101/51 | HR 73 | Temp 97.8°F | Resp 18 | Ht 58.5 in | Wt 114.6 lb

## 2018-11-15 DIAGNOSIS — R590 Localized enlarged lymph nodes: Secondary | ICD-10-CM | POA: Diagnosis not present

## 2018-11-15 DIAGNOSIS — I708 Atherosclerosis of other arteries: Secondary | ICD-10-CM | POA: Diagnosis not present

## 2018-11-15 DIAGNOSIS — R109 Unspecified abdominal pain: Secondary | ICD-10-CM | POA: Diagnosis not present

## 2018-11-15 DIAGNOSIS — C9110 Chronic lymphocytic leukemia of B-cell type not having achieved remission: Secondary | ICD-10-CM

## 2018-11-15 DIAGNOSIS — R161 Splenomegaly, not elsewhere classified: Secondary | ICD-10-CM | POA: Diagnosis not present

## 2018-11-15 DIAGNOSIS — Z9225 Personal history of immunosupression therapy: Secondary | ICD-10-CM | POA: Diagnosis not present

## 2018-11-15 DIAGNOSIS — C8300 Small cell B-cell lymphoma, unspecified site: Secondary | ICD-10-CM

## 2018-11-15 LAB — COMPREHENSIVE METABOLIC PANEL
ALT: 24 U/L (ref 0–44)
AST: 28 U/L (ref 15–41)
Albumin: 4.2 g/dL (ref 3.5–5.0)
Alkaline Phosphatase: 85 U/L (ref 38–126)
Anion gap: 6 (ref 5–15)
BUN: 12 mg/dL (ref 8–23)
CO2: 28 mmol/L (ref 22–32)
Calcium: 9.5 mg/dL (ref 8.9–10.3)
Chloride: 106 mmol/L (ref 98–111)
Creatinine, Ser: 0.92 mg/dL (ref 0.44–1.00)
GFR calc Af Amer: >60 mL/min (ref >60)
GFR calc non Af Amer: >60 mL/min (ref >60)
Glucose, Bld: 81 mg/dL (ref 70–99)
Potassium: 3.7 mmol/L (ref 3.5–5.1)
Sodium: 140 mmol/L (ref 135–145)
Total Bilirubin: 0.5 mg/dL (ref 0.3–1.2)
Total Protein: 6.1 g/dL — ABNORMAL LOW (ref 6.5–8.1)

## 2018-11-15 LAB — CBC WITH DIFFERENTIAL/PLATELET
Abs Immature Granulocytes: 0.12 10*3/uL — ABNORMAL HIGH (ref 0.00–0.07)
Basophils Absolute: 0.1 10*3/uL (ref 0.0–0.1)
Basophils Relative: 1 %
Eosinophils Absolute: 0.2 10*3/uL (ref 0.0–0.5)
Eosinophils Relative: 1 %
HCT: 39.1 % (ref 36.0–46.0)
Hemoglobin: 12.6 g/dL (ref 12.0–15.0)
Immature Granulocytes: 1 %
Lymphocytes Relative: 69 %
Lymphs Abs: 8.2 10*3/uL — ABNORMAL HIGH (ref 0.7–4.0)
MCH: 29.3 pg (ref 26.0–34.0)
MCHC: 32.2 g/dL (ref 30.0–36.0)
MCV: 90.9 fL (ref 80.0–100.0)
Monocytes Absolute: 0.4 10*3/uL (ref 0.1–1.0)
Monocytes Relative: 4 %
Neutro Abs: 2.8 10*3/uL (ref 1.7–7.7)
Neutrophils Relative %: 24 %
Platelets: 106 10*3/uL — ABNORMAL LOW (ref 150–400)
RBC: 4.3 MIL/uL (ref 3.87–5.11)
RDW: 13.5 % (ref 11.5–15.5)
WBC: 11.8 10*3/uL — ABNORMAL HIGH (ref 4.0–10.5)
nRBC: 0 % (ref 0.0–0.2)

## 2018-11-15 LAB — LACTATE DEHYDROGENASE: LDH: 198 U/L — ABNORMAL HIGH (ref 98–192)

## 2018-11-15 MED ORDER — VENETOCLAX 10 & 50 & 100 MG PO TBPK: 10.0000 mg | ORAL_TABLET | Freq: Every morning | ORAL | 0 refills | Status: DC

## 2018-11-15 NOTE — Telephone Encounter (Signed)
I talk with patient regarding schedule  

## 2018-11-16 ENCOUNTER — Telehealth: Payer: Self-pay | Admitting: Pharmacist

## 2018-11-16 DIAGNOSIS — C9110 Chronic lymphocytic leukemia of B-cell type not having achieved remission: Secondary | ICD-10-CM

## 2018-11-16 MED ORDER — VENETOCLAX 10 & 50 & 100 MG PO TBPK: ORAL_TABLET | ORAL | 0 refills | Status: DC

## 2018-11-16 NOTE — Telephone Encounter (Signed)
Oral Oncology Pharmacist Encounter  Received new prescription for Venclexta (venetoclax) for the treatment of heavily pretreated chronic lymphocytic leukemia, planned duration until disease progression or unacceptable toxicity. Planned start date: 12/15/2018  Original diagnosis in 2002 Patient has experienced treatment with multiple treatment lines with most recent disease progression on ibrutinib. She will continue on the ibrutinib in the meantime until we get closer to venetoclax start date, then the ibrutinib will be discontinued.  Venclexta is planned to be dosed on up-titration schedule for CLL Week 1: 20mg  by mouth once daily with food and water Week 2: 50mg  by mouth once daily with food and water Week 3: 100mg  by mouth once daily with food and water Week 4: 200mg  by mouth once daily with food and water Week 5: 400mg  by mouth once daily with food and water  400mg  once daily is target dose of Venclexta for CLL  Labs from 11/15/18 assessed, OK for treatment initiation.  Tumor lysis risk assessment:  Medium tumor burden: Venclexta can be initiated as an outpatient, it is noted that patient with CrCl < 80 mL/min may be at increased risk of TLS  All lymph nodes < 5 cm, but there is significant splenomegaly noted on recent CT scan  ALC 8.2 K/uL  9/21 SCr=0.92, round to 1.0 per institutional policy for age > 61, est CrCl ~ 45 mL/min  2-3 days prior to Venclexta initiation, patient will be instructed to start allopurinol and oral hydration at 1.5-2L/day Patient already with allopurinol on medication list, I will ensure patient is still taking it. Additional IV hydration may be given on dose initiation and some dose ramp up days Blood chemistry monitoring (complete metabolic panel, LDH, uric acid, phosphorus) will occur for 1st dose of 20mg  and 50mg : pre-dose, 6-8 hours post dose administration, and 24 hours post dose administration Blood chemistry monitoring will occur pre-dose for  subsequent ramp up doses CBC with diff will be checked at least once weekly  Message has been sent to Wilber Bihari, NP, for help in coordinating initiation appointments.  Current medication list in Epic reviewed, no DDIs with venetoclax identified.  Prescription has been e-scribed to the [facility] for benefits analysis and approval.  Oral Oncology Clinic will continue to follow for insurance authorization, copayment issues, initial counseling and start date.  Johny Drilling, PharmD, BCPS, BCOP  11/16/2018 8:49 AM Oral Oncology Clinic [phone]

## 2018-11-17 ENCOUNTER — Telehealth: Payer: Self-pay

## 2018-11-17 NOTE — Telephone Encounter (Signed)
Oral Oncology Patient Advocate Encounter  Received notification from [employer] that prior authorization for Venclexta is required.  PA submitted on CoverMyMeds Key [id number] Status is pending  Oral Oncology Clinic will continue to follow.  [place] Patient [place] Phone [phone] Fax [phone] 11/17/2018    8:36 AM

## 2018-11-19 ENCOUNTER — Telehealth: Payer: Self-pay | Admitting: *Deleted

## 2018-11-19 NOTE — Telephone Encounter (Signed)
FYI [facility] faxed Prior authorization appeal rights for Venclextra tablets.  Request to Oral chemotherapy pharmacy requests and review folder in Triage area.

## 2018-11-19 NOTE — Telephone Encounter (Signed)
Oral Oncology Pharmacist Encounter  Original PA request for venetoclax tablets was denied by [facility] as patient did not fit into a high risk category per their approval algorithm.  Prior authorization re-submitted noting patient's age  68: [place] authorization has now been approved PA Case ID: [id number] Effective dates: 11/19/2018-02/23/2098  Johny Drilling, PharmD, BCPS, BCOP  11/19/2018 3:24 PM Oral Oncology Clinic [phone]

## 2018-11-23 ENCOUNTER — Encounter (HOSPITAL_COMMUNITY): Payer: Self-pay | Admitting: Oncology

## 2018-11-23 ENCOUNTER — Telehealth: Payer: Self-pay | Admitting: *Deleted

## 2018-11-23 NOTE — Telephone Encounter (Signed)
This RN spoke with pt per her call stating she received letter from [place] stating denial of medication Veneoclax.  " what do I need to do ?"  This RN informed her this office is aware of denial and requesting an appeal. Discussed this office will follow up with her once we have further information on how to best obtain medication.  Rachel [name] verbalized understanding and will await call from this office.  This note will be sent to Oral pharmacist for further follow up and contact with pt.

## 2018-11-30 DIAGNOSIS — K5730 Diverticulosis of large intestine without perforation or abscess without bleeding: Secondary | ICD-10-CM | POA: Diagnosis not present

## 2018-11-30 DIAGNOSIS — K5732 Diverticulitis of large intestine without perforation or abscess without bleeding: Secondary | ICD-10-CM | POA: Diagnosis not present

## 2018-11-30 DIAGNOSIS — R1032 Left lower quadrant pain: Secondary | ICD-10-CM | POA: Diagnosis not present

## 2018-12-01 NOTE — Telephone Encounter (Signed)
Oral Chemotherapy Pharmacist Encounter  I spoke with patient for overview of: Venclexta (venetoclax) for the treatment of relapsed/refractory CLL in conjunction with rituximab.  Venclexta dose will be titrated up per manufacturer recommendations. Daily dosing of Venclexta will be continued until disease progression or unacceptable toxicity.  Counseled patient on administration, dosing, side effects, monitoring, drug-food interactions, safe handling, storage, and disposal.  Patient will take Venclexta 10mg  tablets, 2 tablets (20 mg) by mouth once daily with food and water for 7 days. The 1st dose of Venclexta 20mg  will be administered outpatient based on tumor lysis syndrome (TLS) risk assessment to be low risk. Baseline labs will be assessed and the 1st dose will be administered. CBC, uric acid, and electrolytes will be monitored at 6-8 hours post dose administration per protocol. Hydration will be administered as needed per MD.  Rachel [name] start date: 12/15/18 Patient will stop Imbruvica on 12/10/18 to allow for washout prior to initiating Venclexta.  Patient was instructed to continue allopurinol 300mg  PO daily, since she will need to be on this starting 3 days prior to [facility] initiation. Patient was instructed to increase fluid intake to 1.5 - 2L of water per day starting 2 days prior to [facility] initiation.  Patient will take Venclexta 50mg  tablets, 1 tablet by mouth once daily with food and water for 7 days. The 1st dose of Venclexta 50mg  tablets will be administered outpatient based on TLS risk assessment. Baseline labs will be assessed and the 1st dose will be administered. CBC, uric acid, and electrolytes will be monitored at 6-8 hours post dose administration per protocol. Hydration will be administered as needed per MD.  Subsequent ramp-up doses of 100mg  daily x 7 days, 200mg  daily x 7 days, and 400mg  daily (target maintenance dose) will be monitored per protocol.  Adverse  effects include but are not limited to: TLS, decreased blood counts, electrolyte abnormalities, diarrhea, nausea, fatigue, arthralgias/myalgias, and upper respiratory tract infection.    Will contact MD so patient has anti-emetic on hand to take if nausea develops. She states that she no longer has promethazine.  Patient will obtain anti diarrheal and alert the office of 4 or more loose stools above baseline.  Reviewed with patient importance of keeping a medication schedule and plan for any missed doses.  Medication reconciliation performed and medication/allergy list updated.  Insurance authorization for Rachel [name] has been obtained. Test claim at the pharmacy revealed copayment $30 for 1st fill of Venclexta. Patient's husband will pick up from the [place] on 12/10/18.   Patient informed the pharmacy will reach out 5-7 days prior to needing next fill of Venclexta to coordinate continued medication acquisition to prevent break in therapy.  All questions answered.  Rachel [name] voiced understanding and appreciation.   Patient knows to call the office with questions or concerns.  Leron Croak, PharmD PGY2 Hematology/Oncology Pharmacy Resident 12/01/2018 12:00 PM Oral Oncology Clinic [phone]

## 2018-12-02 ENCOUNTER — Other Ambulatory Visit: Payer: Self-pay | Admitting: Oncology

## 2018-12-02 MED ORDER — PROCHLORPERAZINE MALEATE 5 MG PO TABS: 5.0000 mg | ORAL_TABLET | Freq: Four times a day (QID) | ORAL | 1 refills | Status: DC | PRN

## 2018-12-03 ENCOUNTER — Other Ambulatory Visit: Payer: Self-pay | Admitting: Obstetrics

## 2018-12-03 NOTE — Telephone Encounter (Signed)
This refill should be done by PCP.

## 2018-12-03 NOTE — Telephone Encounter (Signed)
Please review for refill. Patient has not been seen in our office since 2019.

## 2018-12-09 ENCOUNTER — Telehealth: Payer: Self-pay | Admitting: Oncology

## 2018-12-09 NOTE — Telephone Encounter (Signed)
Scheduled appt per 10/8 sch message - pt is aware of first weeks appts for the wk of 10/19 - she will get an updated schedule next visit.

## 2018-12-10 ENCOUNTER — Other Ambulatory Visit: Payer: Self-pay | Admitting: Gastroenterology

## 2018-12-10 DIAGNOSIS — R1032 Left lower quadrant pain: Secondary | ICD-10-CM

## 2018-12-10 MED FILL — VENCLEXTA STARTING PACK: 10 & 50 & 1 | 28 days supply | Qty: 42 | Fill #0

## 2018-12-13 NOTE — Telephone Encounter (Signed)
Oral Oncology Patient Advocate Encounter  Confirmed with [facility] that Venclexta was picked up on 12/10/18 with a $0 copay using Rachel [name].   [place] Patient [place] Phone [phone] Fax [phone] 12/13/2018   8:28 AM

## 2018-12-15 ENCOUNTER — Other Ambulatory Visit: Payer: Self-pay

## 2018-12-15 ENCOUNTER — Inpatient Hospital Stay: Payer: Medicare Other | Attending: Oncology | Admitting: Adult Health

## 2018-12-15 ENCOUNTER — Other Ambulatory Visit: Payer: Self-pay | Admitting: Pharmacist

## 2018-12-15 ENCOUNTER — Encounter: Payer: Self-pay | Admitting: Adult Health

## 2018-12-15 ENCOUNTER — Inpatient Hospital Stay: Payer: Medicare Other

## 2018-12-15 VITALS — BP 94/44 | HR 94 | Temp 97.8°F | Resp 18 | Ht 58.5 in | Wt 116.8 lb

## 2018-12-15 DIAGNOSIS — K5792 Diverticulitis of intestine, part unspecified, without perforation or abscess without bleeding: Secondary | ICD-10-CM | POA: Insufficient documentation

## 2018-12-15 DIAGNOSIS — D696 Thrombocytopenia, unspecified: Secondary | ICD-10-CM

## 2018-12-15 DIAGNOSIS — C9110 Chronic lymphocytic leukemia of B-cell type not having achieved remission: Secondary | ICD-10-CM | POA: Insufficient documentation

## 2018-12-15 DIAGNOSIS — R5383 Other fatigue: Secondary | ICD-10-CM | POA: Insufficient documentation

## 2018-12-15 DIAGNOSIS — Z79899 Other long term (current) drug therapy: Secondary | ICD-10-CM | POA: Diagnosis not present

## 2018-12-15 DIAGNOSIS — Z23 Encounter for immunization: Secondary | ICD-10-CM | POA: Diagnosis not present

## 2018-12-15 DIAGNOSIS — C8300 Small cell B-cell lymphoma, unspecified site: Secondary | ICD-10-CM

## 2018-12-15 DIAGNOSIS — Z95828 Presence of other vascular implants and grafts: Secondary | ICD-10-CM | POA: Diagnosis not present

## 2018-12-15 DIAGNOSIS — D649 Anemia, unspecified: Secondary | ICD-10-CM | POA: Insufficient documentation

## 2018-12-15 LAB — CBC WITH DIFFERENTIAL/PLATELET
Abs Immature Granulocytes: 0.1 10*3/uL — ABNORMAL HIGH (ref 0.00–0.07)
Band Neutrophils: 2 %
Basophils Absolute: 0 10*3/uL (ref 0.0–0.1)
Basophils Relative: 1 %
Eosinophils Absolute: 0.1 10*3/uL (ref 0.0–0.5)
Eosinophils Relative: 3 %
HCT: 34.3 % — ABNORMAL LOW (ref 36.0–46.0)
Hemoglobin: 11.5 g/dL — ABNORMAL LOW (ref 12.0–15.0)
Lymphocytes Relative: 28 %
Lymphs Abs: 0.8 10*3/uL (ref 0.7–4.0)
MCH: 30.1 pg (ref 26.0–34.0)
MCHC: 33.5 g/dL (ref 30.0–36.0)
MCV: 89.8 fL (ref 80.0–100.0)
Metamyelocytes Relative: 2 %
Monocytes Absolute: 0.2 10*3/uL (ref 0.1–1.0)
Monocytes Relative: 8 %
Myelocytes: 1 %
Neutro Abs: 1.7 10*3/uL (ref 1.7–17.7)
Neutrophils Relative %: 55 %
Platelets: 92 10*3/uL — ABNORMAL LOW (ref 150–400)
RBC: 3.82 MIL/uL — ABNORMAL LOW (ref 3.87–5.11)
RDW: 13.7 % (ref 11.5–15.5)
WBC: 3 10*3/uL — ABNORMAL LOW (ref 4.0–10.5)
nRBC: 0 % (ref 0.0–0.2)

## 2018-12-15 LAB — CMP (CANCER CENTER ONLY)
ALT: 17 U/L (ref 0–44)
AST: 18 U/L (ref 15–41)
Albumin: 3.1 g/dL — ABNORMAL LOW (ref 3.5–5.0)
Alkaline Phosphatase: 83 U/L (ref 38–126)
Anion gap: 11 (ref 5–15)
BUN: 15 mg/dL (ref 8–23)
CO2: 26 mmol/L (ref 22–32)
Calcium: 8 mg/dL — ABNORMAL LOW (ref 8.9–10.3)
Chloride: 108 mmol/L (ref 98–111)
Creatinine: 0.82 mg/dL (ref 0.44–1.00)
GFR, Est AFR Am: >60 mL/min (ref >60)
GFR, Estimated: >60 mL/min (ref >60)
Glucose, Bld: 145 mg/dL — ABNORMAL HIGH (ref 70–99)
Potassium: 3.5 mmol/L (ref 3.5–5.1)
Sodium: 145 mmol/L (ref 135–145)
Total Bilirubin: 0.3 mg/dL (ref 0.3–1.2)
Total Protein: 5.2 g/dL — ABNORMAL LOW (ref 6.5–8.1)

## 2018-12-15 LAB — COMPREHENSIVE METABOLIC PANEL
ALT: 18 U/L (ref 0–44)
AST: 20 U/L (ref 15–41)
Albumin: 3.4 g/dL — ABNORMAL LOW (ref 3.5–5.0)
Alkaline Phosphatase: 78 U/L (ref 38–126)
Anion gap: 12 (ref 5–15)
BUN: 13 mg/dL (ref 8–23)
CO2: 27 mmol/L (ref 22–32)
Calcium: 8.6 mg/dL — ABNORMAL LOW (ref 8.9–10.3)
Chloride: 106 mmol/L (ref 98–111)
Creatinine, Ser: 0.85 mg/dL (ref 0.44–1.00)
GFR calc Af Amer: >60 mL/min (ref >60)
GFR calc non Af Amer: >60 mL/min (ref >60)
Glucose, Bld: 104 mg/dL — ABNORMAL HIGH (ref 70–99)
Potassium: 3.3 mmol/L — ABNORMAL LOW (ref 3.5–5.1)
Sodium: 145 mmol/L (ref 135–145)
Total Bilirubin: 0.4 mg/dL (ref 0.3–1.2)
Total Protein: 5.5 g/dL — ABNORMAL LOW (ref 6.5–8.1)

## 2018-12-15 LAB — URIC ACID
Uric Acid, Serum: 5.9 mg/dL (ref 2.5–7.1)
Uric Acid, Serum: 5.2 mg/dL (ref 2.5–7.1)

## 2018-12-15 LAB — LACTATE DEHYDROGENASE
LDH: 288 U/L — ABNORMAL HIGH (ref 98–192)
LDH: 267 U/L — ABNORMAL HIGH (ref 98–192)

## 2018-12-15 LAB — PHOSPHORUS
Phosphorus: 3.9 mg/dL (ref 2.5–4.6)
Phosphorus: 3.7 mg/dL (ref 2.5–4.6)

## 2018-12-15 MED ORDER — SODIUM CHLORIDE 0.9 % IV SOLN
INTRAVENOUS | Status: DC
  Administered 2018-12-15: 12:00:00 via INTRAVENOUS
  Filled 2018-12-15 (×2): qty 250

## 2018-12-15 NOTE — Progress Notes (Signed)
[place]  [phone] [phone]     ID: Rachel [name]   DOB: [birth year]  MR#: [id number]  [id number]  Patient Care Team: Leeroy Cha, MD as PCP - General (Internal Medicine) Magrinat, Virgie Dad, MD as Consulting Physician (Oncology) OTHER MD:   CHIEF COMPLAINT: Chronic lymphocytic leukemia  CURRENT TREATMENT: Ibrutinib   INTERVAL HISTORY: [name] returns today for follow-up and treatment of her chronic lymphocytic leukemia.She is here today to start Venetoclax.  She reveals that she has been slightly more fatigued because she has had a diverticulitis flare.  She is feeling better, and has completed antibiotics for this.  She has no nausea, vomiting, or diarrhea.  She is going to undergo colonoscopy and endoscopy in 2 week.s    REVIEW OF SYSTEMS: [name] is feeling moderately well today.  She has no headaches, vision issues, fever, chills, chest pain, palpitations, cough, shortness of breath, bowel/bladder changes, nausea, vomiting, or any other concerns.  A detailed ROS was otherwise non contributory today.    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 She has been treated with Rituxan, ofatumumab, cladribine, bendamustine and currently with ibrutinib, with details summarized below.   PAST MEDICAL HISTORY: Past Medical History:  Diagnosis Date  . 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 Diverticulitis   . Leukemia ([place])   . Lower back pain     PAST SURGICAL HISTORY: Past Surgical History:  Procedure Laterality Date  . BREAST SURGERY  2015   left breast bx  . BUNIONECTOMY     right foot  . lining of uterus removed    . OTHER SURGICAL HISTORY     removal of uterine lining  . TONSILLECTOMY      FAMILY HISTORY Family History  Problem Relation Age of Onset  . Depression Son     GYNECOLOGIC  HISTORY: Menarche age 6, first live birth age 52, she is [place] P3. She stopped having periods in 1999 after endometrial stripping. She continues on hormone replacement.  SOCIAL HISTORY: [name] is retired from [employer]. Her son [name] lives in [place]; he works for a [employer]. A second son lives in [place] and has two children. Son [name] lives in [place]. She has 8 grandchildren, the oldest is 74 and the youngest is 65.  ADVANCED DIRECTIVES: In place  HEALTH MAINTENANCE: Social History   Tobacco Use  . Smoking status: Never Smoker  . Smokeless tobacco: Never Used  Substance Use Topics  . Alcohol use: Yes    Alcohol/week: 0.0 standard drinks    Comment: socially  . Drug use: No     Colonoscopy: due  PAP: December 2012/ [name]  Bone density: Due  Lipid panel: per Dr Drema Dallas  Allergies  Allergen Reactions  . Penicillins Anaphylaxis    "stopped breathing" "drops my heartbeat" "I just pass out"    Current Outpatient Medications  Medication Sig Dispense Refill  . acetaminophen (TYLENOL) 500 MG tablet Take 500-1,000 mg by mouth every 6 (six) hours as needed for headache.     Marland Kitchen acyclovir (ZOVIRAX) 400 MG tablet Take 1 tablet (400 mg total) by mouth 2 (two) times daily. 180 tablet 3  . allopurinol (ZYLOPRIM) 300 MG tablet Take 1 tablet (300 mg total) by mouth daily. 90 tablet 3  . ciprofloxacin (CIPRO) 500 MG tablet Take 1 tablet (500 mg total) by mouth 2 (two) times daily. 20 tablet 0  .  Estradiol (ELESTRIN) 0.52 MG/0.87 GM (0.06%) GEL APPLY 2 PUMPS THINLY TO UPPER ARM DAILY AT BEDTIME 6 Bottle 3  . ibrutinib (IMBRUVICA) 140 MG capsul Take 3 capsules (420 mg total) by mouth daily. 270 capsule 4  . lidocaine-prilocaine (EMLA) cream Apply a nickel size amount to skin on top of port- cover with saran wrap at least 1 hour before access 30 g 0  . metroNIDAZOLE (FLAGYL) 500 MG tablet Take 1 tablet (500 mg total) by mouth 2 (two) times daily. 20 tablet 0  . PROAIR  HFA 108 (90 Base) MCG/ACT inhaler USE 2 INHALATIONS EVERY 6 HOURS AS NEEDED FOR WHEEZING OR SHORTNESS OF BREATH (Patient taking differently: Inhale 2 puffs into the lungs every 6 (six) hours as needed for wheezing or shortness of breath. ) 8.5 g 0  . prochlorperazine (COMPAZINE) 5 MG tablet Take 1-2 tablets (5-10 mg total) by mouth every 6 (six) hours as needed for nausea or vomiting. 60 tablet 1  . progesterone (PROMETRIUM) 200 MG capsule Take 1 capsule (200 mg total) by mouth daily. 90 capsule 3  . promethazine (PHENERGAN) 25 MG tablet Take 1 tablet (25 mg total) by mouth every 6 (six) hours as needed for nausea or vomiting. 10 tablet 0  . traMADol (ULTRAM) 50 MG tablet Take 1 tablet (50 mg total) by mouth every 6 (six) hours as needed for severe pain. 15 tablet 0  . traZODone (DESYREL) 100 MG tablet Take 100 mg by mouth at bedtime.    Marland Kitchen venetoclax 10 & 50 & 100 MG TBPK Week1: Take 20mg  once daily. [id number]: Take 50mg  once daily. [id number]: Take 100mg  once daily. [id number]: Take 200mg  once daily. Take w food & water 42 each 0   No current facility-administered medications for this visit.     OBJECTIVE:  Vitals:   12/15/18 1050  BP: (!) 94/44  Pulse: 94  Resp: 18  Temp: 97.8 F (36.6 C)  SpO2: 100%   Wt Readings from Last 3 Encounters:  12/15/18 116 lb 12.8 oz (53 kg)  11/15/18 114 lb 9.6 oz (52 kg)  08/23/18 118 lb 12.8 oz (53.9 kg)   Body mass index is 24 kg/m.    ECOG FS:1 - Symptomatic but completely ambulatory  GENERAL: Patient is a well appearing female in no acute distress HEENT:  Sclerae anicteric.  Mask in place. . Neck is supple.  NODES:  No cervical, supraclavicular, or axillary lymphadenopathy palpated.  LUNGS:  Clear to auscultation bilaterally.  No wheezes or rhonchi. HEART:  Regular rate and rhythm. No murmur appreciated. ABDOMEN:  Soft, nontender.  Positive, normoactive bowel sounds. No organomegaly palpated. MSK:  No focal spinal tenderness to palpation.  EXTREMITIES:   No peripheral edema.   SKIN:  Clear with no obvious rashes or skin changes. No nail dyscrasia. NEURO:  Nonfocal. Well oriented.  Appropriate affect.    LAB RESULTS:  Lab Results  Component Value Date   WBC 3.0 (L) 12/15/2018   NEUTROABS 1.7 12/15/2018   HGB 11.5 (L) 12/15/2018   HCT 34.3 (L) 12/15/2018   MCV 89.8 12/15/2018   PLT 92 (L) 12/15/2018      Chemistry      Component Value Date/Time   NA 145 12/15/2018 1041   NA 140 12/31/2016 1406   K 3.3 (L) 12/15/2018 1041   K 3.6 12/31/2016 1406   CL 106 12/15/2018 1041   CL 101 12/15/2011 1322   CO2 27 12/15/2018 1041   CO2 24 12/31/2016 1406  BUN 13 12/15/2018 1041   BUN 12.8 12/31/2016 1406   CREATININE 0.85 12/15/2018 1041   CREATININE 0.88 08/23/2018 1115   CREATININE 0.9 12/31/2016 1406      Component Value Date/Time   CALCIUM 8.6 (L) 12/15/2018 1041   CALCIUM 8.8 12/31/2016 1406   ALKPHOS 78 12/15/2018 1041   ALKPHOS 71 12/31/2016 1406   AST 20 12/15/2018 1041   AST 29 08/23/2018 1115   AST 18 12/31/2016 1406   ALT 18 12/15/2018 1041   ALT 21 08/23/2018 1115   ALT 11 12/31/2016 1406   BILITOT 0.4 12/15/2018 1041   BILITOT 0.6 08/23/2018 1115   BILITOT 0.49 12/31/2016 1406     STUDIES: No results found.   ASSESSMENT: 68 y.o.   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 rigors 02/27/2014: received IV vancomycin and levaquin briefly, then levaquin po (pcn allergy)  (6) bendamustine/ rituximab for 6 cycles, completed 05/04/2014, with improvement but not normalization of the absolute lymphocyte count  (7) ibrutinib started 05/08/2014, currently at 420mg  daily. Stopped by patient in June, 2019 restarted in September, 2019.  (a) hepatitis B studies 03/06/2014 and 12/31/2016, negative  (8) CT abd/pelvis 10/22/2018 shows retroperitoneal adenopathy, increased splenomegaly  (9) consider starting venetoclax 12/15/2018  PLAN: [name] is doing well today.  She has no issues.  She is going to start with the Venetoclax today at 20mg  daily.  She and I reviewed that she should drink plenty of water, and that she should take the Venetoclax with a meal.  She understands this.  She will receive IV fluids today and tomorrow.    [name] and I reviewed the timing of her appointments.  We are working on getting her appointments updated.  She notes that she has a difficult time drinking water, so she would appreciate receiving IV fluids weekly if possible.  I spent a lot of time reviewing her schedule and writing things out for her today.  I reviewed the above with Dr. Jana Hakim, along with her recent diverticulitis.  He is in agreement with her proceeding with her medication today.    [name] will return tomorrow for labs and IV fluids and in 1 week for labs, f/u, and her next dose increase.  She was recommended to continue with the appropriate pandemic precautions. She knows to call for any questions that may arise between now and her next appointment.  We are happy to see her sooner if needed.  A total of (30) minutes of face-to-face time was spent with this patient with greater than 50% of that time in counseling and care-coordination.   Wilber Bihari, NP 12/15/18 12:02 PM Medical Oncology and Hematology [facility] [place], [place] [address] Tel.  [phone]    Fax. [phone]

## 2018-12-16 ENCOUNTER — Other Ambulatory Visit: Payer: Self-pay

## 2018-12-16 ENCOUNTER — Inpatient Hospital Stay: Payer: Medicare Other

## 2018-12-16 VITALS — BP 110/63 | HR 73 | Temp 98.0°F | Resp 18

## 2018-12-16 DIAGNOSIS — C9110 Chronic lymphocytic leukemia of B-cell type not having achieved remission: Secondary | ICD-10-CM

## 2018-12-16 DIAGNOSIS — Z23 Encounter for immunization: Secondary | ICD-10-CM

## 2018-12-16 DIAGNOSIS — D649 Anemia, unspecified: Secondary | ICD-10-CM | POA: Diagnosis not present

## 2018-12-16 DIAGNOSIS — R5383 Other fatigue: Secondary | ICD-10-CM | POA: Diagnosis not present

## 2018-12-16 DIAGNOSIS — Z95828 Presence of other vascular implants and grafts: Secondary | ICD-10-CM

## 2018-12-16 DIAGNOSIS — K5792 Diverticulitis of intestine, part unspecified, without perforation or abscess without bleeding: Secondary | ICD-10-CM | POA: Diagnosis not present

## 2018-12-16 DIAGNOSIS — Z79899 Other long term (current) drug therapy: Secondary | ICD-10-CM | POA: Diagnosis not present

## 2018-12-16 LAB — COMPREHENSIVE METABOLIC PANEL
ALT: 14 U/L (ref 0–44)
AST: 16 U/L (ref 15–41)
Albumin: 3 g/dL — ABNORMAL LOW (ref 3.5–5.0)
Alkaline Phosphatase: 75 U/L (ref 38–126)
Anion gap: 9 (ref 5–15)
BUN: 11 mg/dL (ref 8–23)
CO2: 28 mmol/L (ref 22–32)
Calcium: 8.1 mg/dL — ABNORMAL LOW (ref 8.9–10.3)
Chloride: 110 mmol/L (ref 98–111)
Creatinine, Ser: 0.8 mg/dL (ref 0.44–1.00)
GFR calc Af Amer: >60 mL/min (ref >60)
GFR calc non Af Amer: >60 mL/min (ref >60)
Glucose, Bld: 119 mg/dL — ABNORMAL HIGH (ref 70–99)
Potassium: 3.2 mmol/L — ABNORMAL LOW (ref 3.5–5.1)
Sodium: 147 mmol/L — ABNORMAL HIGH (ref 135–145)
Total Bilirubin: 0.4 mg/dL (ref 0.3–1.2)
Total Protein: 4.8 g/dL — ABNORMAL LOW (ref 6.5–8.1)

## 2018-12-16 LAB — CBC WITH DIFFERENTIAL/PLATELET
Abs Immature Granulocytes: 0.13 10*3/uL — ABNORMAL HIGH (ref 0.00–0.07)
Basophils Absolute: 0 10*3/uL (ref 0.0–0.1)
Basophils Relative: 1 %
Eosinophils Absolute: 0.1 10*3/uL (ref 0.0–0.5)
Eosinophils Relative: 2 %
HCT: 30.5 % — ABNORMAL LOW (ref 36.0–46.0)
Hemoglobin: 10.1 g/dL — ABNORMAL LOW (ref 12.0–15.0)
Immature Granulocytes: 5 %
Lymphocytes Relative: 37 %
Lymphs Abs: 1 10*3/uL (ref 0.7–4.0)
MCH: 30.2 pg (ref 26.0–34.0)
MCHC: 33.1 g/dL (ref 30.0–36.0)
MCV: 91.3 fL (ref 80.0–100.0)
Monocytes Absolute: 0.3 10*3/uL (ref 0.1–1.0)
Monocytes Relative: 11 %
Neutro Abs: 1.1 10*3/uL — ABNORMAL LOW (ref 1.7–7.7)
Neutrophils Relative %: 44 %
Platelets: 73 10*3/uL — ABNORMAL LOW (ref 150–400)
RBC: 3.34 MIL/uL — ABNORMAL LOW (ref 3.87–5.11)
RDW: 13.9 % (ref 11.5–15.5)
WBC: 2.6 10*3/uL — ABNORMAL LOW (ref 4.0–10.5)
nRBC: 0 % (ref 0.0–0.2)

## 2018-12-16 LAB — URIC ACID: Uric Acid, Serum: 5 mg/dL (ref 2.5–7.1)

## 2018-12-16 LAB — PHOSPHORUS: Phosphorus: 3.7 mg/dL (ref 2.5–4.6)

## 2018-12-16 LAB — LACTATE DEHYDROGENASE: LDH: 251 U/L — ABNORMAL HIGH (ref 98–192)

## 2018-12-16 MED ORDER — SODIUM CHLORIDE 0.9% FLUSH
10.0000 mL | INTRAVENOUS | Status: DC | PRN
  Administered 2018-12-16: 10 mL via INTRAVENOUS
  Filled 2018-12-16: qty 10

## 2018-12-16 MED ORDER — SODIUM CHLORIDE 0.9 % IV SOLN
INTRAVENOUS | Status: AC
  Administered 2018-12-16: 09:00:00 via INTRAVENOUS
  Filled 2018-12-16 (×2): qty 250

## 2018-12-16 MED ORDER — HEPARIN SOD (PORK) LOCK FLUSH 100 UNIT/ML IV SOLN
500.0000 [IU] | Freq: Once | INTRAVENOUS | Status: AC
  Administered 2018-12-16: 500 [IU] via INTRAVENOUS
  Filled 2018-12-16: qty 5

## 2018-12-16 MED ORDER — INFLUENZA VAC A&B SA ADJ QUAD 0.5 ML IM PRSY
0.5000 mL | PREFILLED_SYRINGE | Freq: Once | INTRAMUSCULAR | Status: AC
  Administered 2018-12-16: 0.5 mL via INTRAMUSCULAR

## 2018-12-16 MED ORDER — INFLUENZA VAC A&B SA ADJ QUAD 0.5 ML IM PRSY
PREFILLED_SYRINGE | INTRAMUSCULAR | Status: AC
  Filled 2018-12-16: qty 0.5

## 2018-12-16 NOTE — Patient Instructions (Signed)
Dehydration, Adult  Dehydration is a condition in which there is not enough fluid or water in the body. This happens when you lose more fluids than you take in. Important organs, such as the kidneys, brain, and heart, cannot function without a proper amount of fluids. Any loss of fluids from the body can lead to dehydration. Dehydration can range from mild to severe. This condition should be treated right away to prevent it from becoming severe. What are the causes? This condition may be caused by:  Vomiting.  Diarrhea.  Excessive sweating, such as from heat exposure or exercise.  Not drinking enough fluid, especially: ? When ill. ? While doing activity that requires a lot of energy.  Excessive urination.  Fever.  Infection.  Certain medicines, such as medicines that cause the body to lose excess fluid (diuretics).  Inability to access safe drinking water.  Reduced physical ability to get adequate water and food. What increases the risk? This condition is more likely to develop in people:  Who have a poorly controlled long-term (chronic) illness, such as diabetes, heart disease, or kidney disease.  Who are age 65 or older.  Who are disabled.  Who live in a place with high altitude.  Who play endurance sports. What are the signs or symptoms? Symptoms of mild dehydration may include:  Thirst.  Dry lips.  Slightly dry mouth.  Dry, warm skin.  Dizziness. Symptoms of moderate dehydration may include:  Very dry mouth.  Muscle cramps.  Dark urine. Urine may be the color of tea.  Decreased urine production.  Decreased tear production.  Heartbeat that is irregular or faster than normal (palpitations).  Headache.  Light-headedness, especially when you stand up from a sitting position.  Fainting (syncope). Symptoms of severe dehydration may include:  Changes in skin, such as: ? Cold and clammy skin. ? Blotchy (mottled) or pale skin. ? Skin that does  not quickly return to normal after being lightly pinched and released (poor skin turgor).  Changes in body fluids, such as: ? Extreme thirst. ? No tear production. ? Inability to sweat when body temperature is high, such as in hot weather. ? Very little urine production.  Changes in vital signs, such as: ? Weak pulse. ? Pulse that is more than 100 beats a minute when sitting still. ? Rapid breathing. ? Low blood pressure.  Other changes, such as: ? Sunken eyes. ? Cold hands and feet. ? Confusion. ? Lack of energy (lethargy). ? Difficulty waking up from sleep. ? Short-term weight loss. ? Unconsciousness. How is this diagnosed? This condition is diagnosed based on your symptoms and a physical exam. Blood and urine tests may be done to help confirm the diagnosis. How is this treated? Treatment for this condition depends on the severity. Mild or moderate dehydration can often be treated at home. Treatment should be started right away. Do not wait until dehydration becomes severe. Severe dehydration is an emergency and it needs to be treated in a hospital. Treatment for mild dehydration may include:  Drinking more fluids.  Replacing salts and minerals in your blood (electrolytes) that you may have lost. Treatment for moderate dehydration may include:  Drinking an oral rehydration solution (ORS). This is a drink that helps you replace fluids and electrolytes (rehydrate). It can be found at pharmacies and retail stores. Treatment for severe dehydration may include:  Receiving fluids through an IV tube.  Receiving an electrolyte solution through a feeding tube that is passed through your nose and   into your stomach (nasogastric tube, or NG tube).  Correcting any abnormalities in electrolytes.  Treating the underlying cause of dehydration. Follow these instructions at home:  If directed by your health care provider, drink an ORS: ? Make an ORS by following instructions on the  package. ? Start by drinking small amounts, about  cup (120 mL) every 5-10 minutes. ? Slowly increase how much you drink until you have taken the amount recommended by your health care provider.  Drink enough clear fluid to keep your urine clear or pale yellow. If you were told to drink an ORS, finish the ORS first, then start slowly drinking other clear fluids. Drink fluids such as: ? Water. Do not drink only water. Doing that can lead to having too little salt (sodium) in the body (hyponatremia). ? Ice chips. ? Fruit juice that you have added water to (diluted fruit juice). ? Low-calorie sports drinks.  Avoid: ? Alcohol. ? Drinks that contain a lot of sugar. These include high-calorie sports drinks, fruit juice that is not diluted, and soda. ? Caffeine. ? Foods that are greasy or contain a lot of fat or sugar.  Take over-the-counter and prescription medicines only as told by your health care provider.  Do not take sodium tablets. This can lead to having too much sodium in the body (hypernatremia).  Eat foods that contain a healthy balance of electrolytes, such as bananas, oranges, potatoes, tomatoes, and spinach.  Keep all follow-up visits as told by your health care provider. This is important. Contact a health care provider if:  You have abdominal pain that: ? Gets worse. ? Stays in one area (localizes).  You have a rash.  You have a stiff neck.  You are more irritable than usual.  You are sleepier or more difficult to wake up than usual.  You feel weak or dizzy.  You feel very thirsty.  You have urinated only a small amount of very dark urine over 6-8 hours. Get help right away if:  You have symptoms of severe dehydration.  You cannot drink fluids without vomiting.  Your symptoms get worse with treatment.  You have a fever.  You have a severe headache.  You have vomiting or diarrhea that: ? Gets worse. ? Does not go away.  You have blood or green matter  (bile) in your vomit.  You have blood in your stool. This may cause stool to look black and tarry.  You have not urinated in 6-8 hours.  You faint.  Your heart rate while sitting still is over 100 beats a minute.  You have trouble breathing. This information is not intended to replace advice given to you by your health care provider. Make sure you discuss any questions you have with your health care provider. Document Released: 02/10/2005 Document Revised: 01/23/2017 Document Reviewed: 04/06/2015 Elsevier Patient Education  2020 [employer]  

## 2018-12-17 ENCOUNTER — Inpatient Hospital Stay: Payer: Medicare Other

## 2018-12-17 LAB — BETA 2 MICROGLOBULIN, SERUM: Beta-2 Microglobulin: 5.2 mg/L — ABNORMAL HIGH (ref 0.6–2.4)

## 2018-12-20 ENCOUNTER — Other Ambulatory Visit: Payer: Medicare Other

## 2018-12-21 NOTE — Progress Notes (Signed)
[place]  [phone] [phone]     ID: Rachel [name]   DOB: 02-28-1950  MR#: [id number]  [id number]  Patient Care Team: Leeroy Cha, MD as PCP - General (Internal Medicine) , Virgie Dad, MD as Consulting Physician (Oncology) OTHER MD:  CHIEF COMPLAINT: Chronic lymphocytic leukemia  CURRENT TREATMENT: venetoclax   INTERVAL HISTORY: Rachel [name] returns today for follow-up and treatment of her chronic lymphocytic leukemia.  She is now off ibrutinib and is starting her venetoclax ramp-up.  She took 20 mg the past week, with no side effects that she is aware of except for the fact that she had some runny stools 2 days.  She also had a little bit of phlegm which she blames on food intolerance.  On Friday of last week, 6 days ago, she had a temperature of 100.0.  Otherwise she has had no issues from this drug.  She started the 50 mg dose today.   REVIEW OF SYSTEMS: Rachel [name] will have fluids today and well as well as labs and then more labs tomorrow.  She needs to hydrate herself aggressively and this is very difficult for her she says.  She is not exercising regularly at this point.  She is worried her wart may return.  The ibrutinib did a very good job of controlling them.  Aside from these issues a detailed review of systems today was stable.    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 She has been treated with Rituxan, ofatumumab, cladribine, bendamustine and currently with ibrutinib, with details summarized below.   PAST MEDICAL HISTORY: Past Medical History:  Diagnosis Date  . 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 Diverticulitis   . Leukemia ([place])   . Lower back pain     PAST SURGICAL HISTORY: Past Surgical History:  Procedure Laterality Date  . BREAST SURGERY  2015   left breast bx  .  BUNIONECTOMY     right foot  . lining of uterus removed    . OTHER SURGICAL HISTORY     removal of uterine lining  . TONSILLECTOMY      FAMILY HISTORY Family History  Problem Relation Age of Onset  . Depression Son     GYNECOLOGIC HISTORY: Menarche age 82, first live birth age 12, she is [place] P3. She stopped having periods in 1999 after endometrial stripping. She continues on hormone replacement.   SOCIAL HISTORY: [name] is retired from [employer]. Her son [name] lives in [place]; he works for a [employer]. A second son lives in [place] and has two children. Son [name] lives in [place]. She has 8 grandchildren, the oldest is 66 and the youngest is 40.  ADVANCED DIRECTIVES: In place   HEALTH MAINTENANCE: Social History   Tobacco Use  . Smoking status: Never Smoker  . Smokeless tobacco: Never Used  Substance Use Topics  . Alcohol use: Yes    Alcohol/week: 0.0 standard drinks    Comment: socially  . Drug use: No     Colonoscopy: due  PAP: December 2012/ [name]  Bone density: Due  Lipid panel: per Dr Drema Dallas  Allergies  Allergen Reactions  . Penicillins Anaphylaxis    "stopped breathing" "drops my heartbeat" "I just pass out"    Current Outpatient Medications  Medication Sig Dispense Refill  . acetaminophen (TYLENOL) 500 MG tablet Take 500-1,000 mg by mouth every 6 (six) hours as needed  for headache.     Marland Kitchen acyclovir (ZOVIRAX) 400 MG tablet Take 1 tablet (400 mg total) by mouth 2 (two) times daily. 180 tablet 3  . allopurinol (ZYLOPRIM) 300 MG tablet Take 1 tablet (300 mg total) by mouth daily. 90 tablet 3  . ciprofloxacin (CIPRO) 500 MG tablet Take 1 tablet (500 mg total) by mouth 2 (two) times daily. 20 tablet 0  . Estradiol (ELESTRIN) 0.52 MG/0.87 GM (0.06%) GEL APPLY 2 PUMPS THINLY TO UPPER ARM DAILY AT BEDTIME 6 Bottle 3  . lidocaine-prilocaine (EMLA) cream Apply a nickel size amount to skin on top of port- cover with saran wrap at least 1  hour before access 30 g 0  . metroNIDAZOLE (FLAGYL) 500 MG tablet Take 1 tablet (500 mg total) by mouth 2 (two) times daily. 20 tablet 0  . PROAIR HFA 108 (90 Base) MCG/ACT inhaler USE 2 INHALATIONS EVERY 6 HOURS AS NEEDED FOR WHEEZING OR SHORTNESS OF BREATH (Patient taking differently: Inhale 2 puffs into the lungs every 6 (six) hours as needed for wheezing or shortness of breath. ) 8.5 g 0  . prochlorperazine (COMPAZINE) 5 MG tablet Take 1-2 tablets (5-10 mg total) by mouth every 6 (six) hours as needed for nausea or vomiting. 60 tablet 1  . progesterone (PROMETRIUM) 200 MG capsule Take 1 capsule (200 mg total) by mouth daily. 90 capsule 3  . promethazine (PHENERGAN) 25 MG tablet Take 1 tablet (25 mg total) by mouth every 6 (six) hours as needed for nausea or vomiting. 10 tablet 0  . traMADol (ULTRAM) 50 MG tablet Take 1 tablet (50 mg total) by mouth every 6 (six) hours as needed for severe pain. 15 tablet 0  . traZODone (DESYREL) 100 MG tablet Take 100 mg by mouth at bedtime.    Marland Kitchen venetoclax 10 & 50 & 100 MG TBPK Week1: Take 20mg  once daily. [id number]: Take 50mg  once daily. [id number]: Take 100mg  once daily. [id number]: Take 200mg  once daily. Take w food & water 42 each 0   No current facility-administered medications for this visit.     OBJECTIVE: Middle-aged white woman in no acute distress  Vitals:   12/22/18 0926  BP: (!) 95/47  Pulse: 80  Resp: 17  Temp: 97.8 F (36.6 C)  SpO2: 100%   Wt Readings from Last 3 Encounters:  12/22/18 114 lb 3.2 oz (51.8 kg)  12/15/18 116 lb 12.8 oz (53 kg)  11/15/18 114 lb 9.6 oz (52 kg)   Body mass index is 23.87 kg/m.    ECOG FS:1 - Symptomatic but completely ambulatory  Sclerae unicteric, EOMs intact Wearing a mask No cervical or supraclavicular adenopathy, no axillary adenopathy. Lungs no rales or rhonchi Heart regular rate and rhythm Abd soft, nontender, positive bowel sounds MSK no focal spinal tenderness, no upper extremity lymphedema Neuro:  nonfocal, well oriented, appropriate affect Breasts: Deferred   LAB RESULTS:  Lab Results  Component Value Date   WBC 3.4 (L) 12/22/2018   NEUTROABS 1.5 (L) 12/22/2018   HGB 11.5 (L) 12/22/2018   HCT 34.5 (L) 12/22/2018   MCV 90.6 12/22/2018   PLT 75 (L) 12/22/2018      Chemistry      Component Value Date/Time   NA 146 (H) 12/22/2018 0847   NA 140 12/31/2016 1406   K 3.5 12/22/2018 0847   K 3.6 12/31/2016 1406   CL 104 12/22/2018 0847   CL 101 12/15/2011 1322   CO2 30 12/22/2018 0847   CO2 24 12/31/2016  1406   BUN 15 12/22/2018 0847   BUN 12.8 12/31/2016 1406   CREATININE 1.29 (H) 12/22/2018 0847   CREATININE 0.82 12/15/2018 1425   CREATININE 0.9 12/31/2016 1406      Component Value Date/Time   CALCIUM 10.5 (H) 12/22/2018 0847   CALCIUM 8.8 12/31/2016 1406   ALKPHOS 77 12/22/2018 0847   ALKPHOS 71 12/31/2016 1406   AST 20 12/22/2018 0847   AST 18 12/15/2018 1425   AST 18 12/31/2016 1406   ALT 14 12/22/2018 0847   ALT 17 12/15/2018 1425   ALT 11 12/31/2016 1406   BILITOT 0.5 12/22/2018 0847   BILITOT 0.3 12/15/2018 1425   BILITOT 0.49 12/31/2016 1406      STUDIES: No results found.   ASSESSMENT: 68 y.o.  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 rigors 02/27/2014: received IV vancomycin and levaquin briefly, then  levaquin po (pcn allergy)  (6) bendamustine/ rituximab for 6 cycles, completed 05/04/2014, with improvement but not normalization of the absolute lymphocyte count  (7) ibrutinib started 05/08/2014, currently at 420mg  daily. Stopped by patient in June, 2019 restarted in September, 2019, discontinued October 2020  (a) hepatitis B studies 03/06/2014 and 12/31/2016, negative  (8) CT abd/pelvis 10/22/2018 shows retroperitoneal adenopathy, increased splenomegaly  (9) started venetoclax 12/15/2018  PLAN: Rachel [name] had no problem with the initial dose of venetoclax.  She is ramping up the dose to 50 mg daily today.  We will check labs again tomorrow and then she will return in a week at which point she will go up to 100 mg assuming no other issues.  She was delighted at her white cell count today, which does indicate early response.  She knows to call for any other issue that may develop before the next visit.  Virgie Dad. , MD 12/22/18 3:57 PM Medical Oncology and Hematology [facility] [place], [place] [address] Tel. [phone]    Fax. [phone]   I, Wilburn Mylar, am acting as scribe for Dr. Virgie Dad. .  I, Lurline Del MD, have reviewed the above documentation for accuracy and completeness, and I agree with the above.

## 2018-12-22 ENCOUNTER — Other Ambulatory Visit: Payer: Self-pay

## 2018-12-22 ENCOUNTER — Inpatient Hospital Stay (HOSPITAL_BASED_OUTPATIENT_CLINIC_OR_DEPARTMENT_OTHER): Payer: Medicare Other | Admitting: Oncology

## 2018-12-22 ENCOUNTER — Inpatient Hospital Stay: Payer: Medicare Other

## 2018-12-22 ENCOUNTER — Telehealth: Payer: Self-pay | Admitting: *Deleted

## 2018-12-22 VITALS — BP 95/47 | HR 80 | Temp 97.8°F | Resp 17 | Ht <= 58 in | Wt 114.2 lb

## 2018-12-22 DIAGNOSIS — Z95828 Presence of other vascular implants and grafts: Secondary | ICD-10-CM

## 2018-12-22 DIAGNOSIS — C9110 Chronic lymphocytic leukemia of B-cell type not having achieved remission: Secondary | ICD-10-CM

## 2018-12-22 DIAGNOSIS — C8300 Small cell B-cell lymphoma, unspecified site: Secondary | ICD-10-CM | POA: Diagnosis not present

## 2018-12-22 DIAGNOSIS — R5383 Other fatigue: Secondary | ICD-10-CM | POA: Diagnosis not present

## 2018-12-22 DIAGNOSIS — Z79899 Other long term (current) drug therapy: Secondary | ICD-10-CM | POA: Diagnosis not present

## 2018-12-22 DIAGNOSIS — D649 Anemia, unspecified: Secondary | ICD-10-CM | POA: Diagnosis not present

## 2018-12-22 DIAGNOSIS — K5792 Diverticulitis of intestine, part unspecified, without perforation or abscess without bleeding: Secondary | ICD-10-CM | POA: Diagnosis not present

## 2018-12-22 DIAGNOSIS — Z23 Encounter for immunization: Secondary | ICD-10-CM | POA: Diagnosis not present

## 2018-12-22 LAB — COMPREHENSIVE METABOLIC PANEL
ALT: 14 U/L (ref 0–44)
AST: 20 U/L (ref 15–41)
Albumin: 3.7 g/dL (ref 3.5–5.0)
Alkaline Phosphatase: 77 U/L (ref 38–126)
Anion gap: 12 (ref 5–15)
BUN: 15 mg/dL (ref 8–23)
CO2: 30 mmol/L (ref 22–32)
Calcium: 10.5 mg/dL — ABNORMAL HIGH (ref 8.9–10.3)
Chloride: 104 mmol/L (ref 98–111)
Creatinine, Ser: 1.29 mg/dL — ABNORMAL HIGH (ref 0.44–1.00)
GFR calc Af Amer: 49 mL/min — ABNORMAL LOW (ref >60)
GFR calc non Af Amer: 42 mL/min — ABNORMAL LOW (ref >60)
Glucose, Bld: 112 mg/dL — ABNORMAL HIGH (ref 70–99)
Potassium: 3.5 mmol/L (ref 3.5–5.1)
Sodium: 146 mmol/L — ABNORMAL HIGH (ref 135–145)
Total Bilirubin: 0.5 mg/dL (ref 0.3–1.2)
Total Protein: 5.9 g/dL — ABNORMAL LOW (ref 6.5–8.1)

## 2018-12-22 LAB — CBC WITH DIFFERENTIAL/PLATELET
Abs Immature Granulocytes: 0.04 10*3/uL (ref 0.00–0.07)
Basophils Absolute: 0 10*3/uL (ref 0.0–0.1)
Basophils Relative: 1 %
Eosinophils Absolute: 0.1 10*3/uL (ref 0.0–0.5)
Eosinophils Relative: 2 %
HCT: 34.5 % — ABNORMAL LOW (ref 36.0–46.0)
Hemoglobin: 11.5 g/dL — ABNORMAL LOW (ref 12.0–15.0)
Immature Granulocytes: 1 %
Lymphocytes Relative: 41 %
Lymphs Abs: 1.4 10*3/uL (ref 0.7–4.0)
MCH: 30.2 pg (ref 26.0–34.0)
MCHC: 33.3 g/dL (ref 30.0–36.0)
MCV: 90.6 fL (ref 80.0–100.0)
Monocytes Absolute: 0.3 10*3/uL (ref 0.1–1.0)
Monocytes Relative: 10 %
Neutro Abs: 1.5 10*3/uL — ABNORMAL LOW (ref 1.7–7.7)
Neutrophils Relative %: 45 %
Platelets: 75 10*3/uL — ABNORMAL LOW (ref 150–400)
RBC: 3.81 MIL/uL — ABNORMAL LOW (ref 3.87–5.11)
RDW: 13.3 % (ref 11.5–15.5)
WBC: 3.4 10*3/uL — ABNORMAL LOW (ref 4.0–10.5)
nRBC: 0 % (ref 0.0–0.2)

## 2018-12-22 LAB — URIC ACID: Uric Acid, Serum: 5.7 mg/dL (ref 2.5–7.1)

## 2018-12-22 LAB — PHOSPHORUS: Phosphorus: 4.1 mg/dL (ref 2.5–4.6)

## 2018-12-22 LAB — LACTATE DEHYDROGENASE: LDH: 255 U/L — ABNORMAL HIGH (ref 98–192)

## 2018-12-22 MED ORDER — HEPARIN SOD (PORK) LOCK FLUSH 10 UNIT/ML IV SOLN: 10.0000 [IU] | Freq: Once | INTRAVENOUS | Status: DC

## 2018-12-22 MED ORDER — SODIUM CHLORIDE 0.9 % IV SOLN
INTRAVENOUS | Status: AC
  Administered 2018-12-22: 10:00:00 via INTRAVENOUS
  Filled 2018-12-22 (×2): qty 250

## 2018-12-22 MED ORDER — SODIUM CHLORIDE 0.9% FLUSH
10.0000 mL | Freq: Once | INTRAVENOUS | Status: AC
  Administered 2018-12-22: 10 mL via INTRAVENOUS
  Filled 2018-12-22: qty 10

## 2018-12-22 MED ORDER — SODIUM CHLORIDE 0.9% FLUSH
10.0000 mL | Freq: Once | INTRAVENOUS | Status: AC
  Administered 2018-12-22: 10 mL
  Filled 2018-12-22: qty 10

## 2018-12-22 MED ORDER — HEPARIN SOD (PORK) LOCK FLUSH 10 UNIT/ML IV SOLN
10.0000 [IU] | Freq: Once | INTRAVENOUS | Status: DC
  Administered 2018-12-22: 10 [IU] via INTRAVENOUS

## 2018-12-22 NOTE — Patient Instructions (Signed)

## 2018-12-22 NOTE — Telephone Encounter (Signed)
Clarification of IVF for today - pt is to get 1 liter over 2 hours.

## 2018-12-23 ENCOUNTER — Inpatient Hospital Stay: Payer: Medicare Other

## 2018-12-23 ENCOUNTER — Other Ambulatory Visit: Payer: Self-pay

## 2018-12-23 ENCOUNTER — Other Ambulatory Visit: Payer: Self-pay | Admitting: *Deleted

## 2018-12-23 ENCOUNTER — Other Ambulatory Visit: Payer: Self-pay | Admitting: Obstetrics

## 2018-12-23 DIAGNOSIS — K5792 Diverticulitis of intestine, part unspecified, without perforation or abscess without bleeding: Secondary | ICD-10-CM | POA: Diagnosis not present

## 2018-12-23 DIAGNOSIS — R5383 Other fatigue: Secondary | ICD-10-CM | POA: Diagnosis not present

## 2018-12-23 DIAGNOSIS — C9110 Chronic lymphocytic leukemia of B-cell type not having achieved remission: Secondary | ICD-10-CM

## 2018-12-23 DIAGNOSIS — D649 Anemia, unspecified: Secondary | ICD-10-CM | POA: Diagnosis not present

## 2018-12-23 DIAGNOSIS — Z79890 Hormone replacement therapy (postmenopausal): Secondary | ICD-10-CM

## 2018-12-23 DIAGNOSIS — Z95828 Presence of other vascular implants and grafts: Secondary | ICD-10-CM

## 2018-12-23 DIAGNOSIS — Z23 Encounter for immunization: Secondary | ICD-10-CM | POA: Diagnosis not present

## 2018-12-23 DIAGNOSIS — Z79899 Other long term (current) drug therapy: Secondary | ICD-10-CM | POA: Diagnosis not present

## 2018-12-23 LAB — CBC WITH DIFFERENTIAL/PLATELET
Abs Immature Granulocytes: 0.05 10*3/uL (ref 0.00–0.07)
Basophils Absolute: 0 10*3/uL (ref 0.0–0.1)
Basophils Relative: 0 %
Eosinophils Absolute: 0.1 10*3/uL (ref 0.0–0.5)
Eosinophils Relative: 2 %
HCT: 33.2 % — ABNORMAL LOW (ref 36.0–46.0)
Hemoglobin: 10.9 g/dL — ABNORMAL LOW (ref 12.0–15.0)
Immature Granulocytes: 2 %
Lymphocytes Relative: 43 %
Lymphs Abs: 1.3 10*3/uL (ref 0.7–4.0)
MCH: 29.6 pg (ref 26.0–34.0)
MCHC: 32.8 g/dL (ref 30.0–36.0)
MCV: 90.2 fL (ref 80.0–100.0)
Monocytes Absolute: 0.3 10*3/uL (ref 0.1–1.0)
Monocytes Relative: 10 %
Neutro Abs: 1.3 10*3/uL — ABNORMAL LOW (ref 1.7–7.7)
Neutrophils Relative %: 43 %
Platelets: 75 10*3/uL — ABNORMAL LOW (ref 150–400)
RBC: 3.68 MIL/uL — ABNORMAL LOW (ref 3.87–5.11)
RDW: 13.2 % (ref 11.5–15.5)
WBC: 2.9 10*3/uL — ABNORMAL LOW (ref 4.0–10.5)
nRBC: 0 % (ref 0.0–0.2)

## 2018-12-23 LAB — COMPREHENSIVE METABOLIC PANEL
ALT: 13 U/L (ref 0–44)
AST: 19 U/L (ref 15–41)
Albumin: 3.6 g/dL (ref 3.5–5.0)
Alkaline Phosphatase: 80 U/L (ref 38–126)
Anion gap: 10 (ref 5–15)
BUN: 12 mg/dL (ref 8–23)
CO2: 27 mmol/L (ref 22–32)
Calcium: 10.6 mg/dL — ABNORMAL HIGH (ref 8.9–10.3)
Chloride: 105 mmol/L (ref 98–111)
Creatinine, Ser: 1.15 mg/dL — ABNORMAL HIGH (ref 0.44–1.00)
GFR calc Af Amer: 56 mL/min — ABNORMAL LOW (ref >60)
GFR calc non Af Amer: 49 mL/min — ABNORMAL LOW (ref >60)
Glucose, Bld: 113 mg/dL — ABNORMAL HIGH (ref 70–99)
Potassium: 3.6 mmol/L (ref 3.5–5.1)
Sodium: 142 mmol/L (ref 135–145)
Total Bilirubin: 0.4 mg/dL (ref 0.3–1.2)
Total Protein: 5.7 g/dL — ABNORMAL LOW (ref 6.5–8.1)

## 2018-12-23 LAB — LACTATE DEHYDROGENASE: LDH: 224 U/L — ABNORMAL HIGH (ref 98–192)

## 2018-12-23 LAB — URIC ACID: Uric Acid, Serum: 5.3 mg/dL (ref 2.5–7.1)

## 2018-12-23 LAB — PHOSPHORUS: Phosphorus: 3.4 mg/dL (ref 2.5–4.6)

## 2018-12-23 MED ORDER — PROCHLORPERAZINE MALEATE 5 MG PO TABS: 5.0000 mg | ORAL_TABLET | Freq: Four times a day (QID) | ORAL | 1 refills | Status: DC | PRN

## 2018-12-23 MED ORDER — HEPARIN SOD (PORK) LOCK FLUSH 100 UNIT/ML IV SOLN
500.0000 [IU] | Freq: Once | INTRAVENOUS | Status: AC
  Administered 2018-12-23: 500 [IU] via INTRAVENOUS
  Filled 2018-12-23: qty 5

## 2018-12-23 MED ORDER — SODIUM CHLORIDE 0.9% FLUSH
10.0000 mL | Freq: Once | INTRAVENOUS | Status: AC
  Administered 2018-12-23: 10 mL via INTRAVENOUS
  Filled 2018-12-23: qty 10

## 2018-12-28 NOTE — Progress Notes (Signed)
[place]  [phone] [phone]     ID: Rachel [name]   DOB: Jul 04, 1950  MR#: [id number]  [id number]  Patient Care Team: Rachel Cha, MD as PCP - General (Internal Medicine) Rachel [name], Rachel Dad, MD as Consulting Physician (Oncology) OTHER MD:  CHIEF COMPLAINT: Chronic lymphocytic leukemia  CURRENT TREATMENT: venetoclax   INTERVAL HISTORY: Rachel [name] returns today for follow-up and treatment of her chronic lymphocytic leukemia.  She continues on her venetoclax ramp-up.  She did fine on her 50 mg week.  She started the 100 mg dose today.  She has a little bit of nausea in the morning.  She thinks this is due to phlegm which she gets around this time of year.  She is taking Compazine which helps a little bit.   REVIEW OF SYSTEMS: Rachel [name] is very busy with family.  She is not exercising regularly.  She is not drinking as much as I would like.  She is scheduled for EGD and colonoscopy a week from today, which will slightly throw ramp-up off.  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 She has been treated with Rituxan, ofatumumab, cladribine, bendamustine and currently with ibrutinib, with details summarized below.   PAST MEDICAL HISTORY: Past Medical History:  Diagnosis Date  . 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 Diverticulitis   . Leukemia ([place])   . Lower back pain     PAST SURGICAL HISTORY: Past Surgical History:  Procedure Laterality Date  . BREAST SURGERY  2015   left breast bx  . BUNIONECTOMY     right foot  . lining of uterus removed    . OTHER SURGICAL HISTORY     removal of uterine lining  . TONSILLECTOMY      FAMILY HISTORY Family History  Problem Relation Age of Onset  . Depression Son     GYNECOLOGIC HISTORY:  Menarche age 67, first live birth age 14, she is [place] P3. She stopped having periods in 1999 after endometrial stripping. She continues on hormone replacement.   SOCIAL HISTORY: Her husband Rachel [name] is retired from [employer]. Her son Rachel [name] lives in [place]; he works for a [employer]. A second son lives in [place] and has two children. Son Rachel [name] lives in [place]. She has 8 grandchildren, the oldest is 58 and the youngest is 65.  ADVANCED DIRECTIVES: In place   HEALTH MAINTENANCE: Social History   Tobacco Use  . Smoking status: Never Smoker  . Smokeless tobacco: Never Used  Substance Use Topics  . Alcohol use: Yes    Alcohol/week: 0.0 standard drinks    Comment: socially  . Drug use: No     Colonoscopy: due  PAP: December 2012/ [name]  Bone density: Due  Lipid panel: per Dr Drema Dallas  Allergies  Allergen Reactions  . Penicillins Anaphylaxis    "stopped breathing" "drops my heartbeat" "I just pass out"    Current Outpatient Medications  Medication Sig Dispense Refill  . acetaminophen (TYLENOL) 500 MG tablet Take 500-1,000 mg by mouth every 6 (six) hours as needed for headache.     Marland Kitchen acyclovir (ZOVIRAX) 400 MG tablet Take 1 tablet (400 mg total) by mouth 2 (two) times daily. 180 tablet 3  . allopurinol (ZYLOPRIM) 300 MG tablet Take 1 tablet (300 mg total) by mouth daily. 90 tablet 3  .  ciprofloxacin (CIPRO) 500 MG tablet Take 1 tablet (500 mg total) by mouth 2 (two) times daily. 20 tablet 0  . Estradiol (ELESTRIN) 0.52 MG/0.87 GM (0.06%) GEL APPLY 2 PUMPS THINLY TO UPPER ARM DAILY AT BEDTIME 6 Bottle 3  . lidocaine-prilocaine (EMLA) cream Apply a nickel size amount to skin on top of port- cover with saran wrap at least 1 hour before access 30 g 0  . metroNIDAZOLE (FLAGYL) 500 MG tablet Take 1 tablet (500 mg total) by mouth 2 (two) times daily. 20 tablet 0  . PROAIR HFA 108 (90 Base) MCG/ACT inhaler USE 2 INHALATIONS EVERY 6 HOURS AS NEEDED FOR WHEEZING OR SHORTNESS OF  BREATH (Patient taking differently: Inhale 2 puffs into the lungs every 6 (six) hours as needed for wheezing or shortness of breath. ) 8.5 g 0  . prochlorperazine (COMPAZINE) 5 MG tablet Take 1-2 tablets (5-10 mg total) by mouth every 6 (six) hours as needed for nausea or vomiting. 60 tablet 1  . progesterone (PROMETRIUM) 200 MG capsule TAKE 1 CAPSULE DAILY 90 capsule 3  . promethazine (PHENERGAN) 25 MG tablet Take 1 tablet (25 mg total) by mouth every 6 (six) hours as needed for nausea or vomiting. 10 tablet 0  . traMADol (ULTRAM) 50 MG tablet Take 1 tablet (50 mg total) by mouth every 6 (six) hours as needed for severe pain. 15 tablet 0  . traZODone (DESYREL) 100 MG tablet Take 100 mg by mouth at bedtime.    Marland Kitchen venetoclax 10 & 50 & 100 MG TBPK Week1: Take 20mg  once daily. [id number]: Take 50mg  once daily. [id number]: Take 100mg  once daily. [id number]: Take 200mg  once daily. Take w food & water 42 each 0   No current facility-administered medications for this visit.     OBJECTIVE: Middle-aged white woman who appears stated age  16:   12/29/18 1027  BP: 115/65  Pulse: 68  Resp: 18  Temp: 98 F (36.7 C)  SpO2: 94%   Wt Readings from Last 3 Encounters:  12/29/18 113 lb 14.4 oz (51.7 kg)  12/22/18 114 lb 3.2 oz (51.8 kg)  12/15/18 116 lb 12.8 oz (53 kg)   Body mass index is 23.81 kg/m.    ECOG FS:1 - Symptomatic but completely ambulatory  Sclerae unicteric, EOMs intact Wearing a mask No cervical or supraclavicular adenopathy, no axillary or inguinal adenopathy Lungs no rales or rhonchi Heart regular rate and rhythm Abd soft, nontender, positive bowel sounds MSK no focal spinal tenderness, no upper extremity lymphedema Neuro: nonfocal, well oriented, appropriate affect Breasts: Deferred   LAB RESULTS:  Lab Results  Component Value Date   WBC 4.0 12/29/2018   NEUTROABS PENDING 12/29/2018   HGB 11.9 (L) 12/29/2018   HCT 35.1 (L) 12/29/2018   MCV 89.3 12/29/2018   PLT 82 (L)  12/29/2018      Chemistry      Component Value Date/Time   NA 142 12/29/2018 1002   NA 140 12/31/2016 1406   K 3.6 12/29/2018 1002   K 3.6 12/31/2016 1406   CL 103 12/29/2018 1002   CL 101 12/15/2011 1322   CO2 29 12/29/2018 1002   CO2 24 12/31/2016 1406   BUN 17 12/29/2018 1002   BUN 12.8 12/31/2016 1406   CREATININE 1.33 (H) 12/29/2018 1002   CREATININE 0.82 12/15/2018 1425   CREATININE 0.9 12/31/2016 1406      Component Value Date/Time   CALCIUM 12.0 (H) 12/29/2018 1002   CALCIUM 8.8 12/31/2016 1406  ALKPHOS 77 12/29/2018 1002   ALKPHOS 71 12/31/2016 1406   AST 24 12/29/2018 1002   AST 18 12/15/2018 1425   AST 18 12/31/2016 1406   ALT 16 12/29/2018 1002   ALT 17 12/15/2018 1425   ALT 11 12/31/2016 1406   BILITOT 0.5 12/29/2018 1002   BILITOT 0.3 12/15/2018 1425   BILITOT 0.49 12/31/2016 1406      STUDIES: No results found.   ASSESSMENT: 69 y.o.  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 rigors 02/27/2014: received IV vancomycin and levaquin briefly, then levaquin po (pcn allergy)  (6) bendamustine/ rituximab for 6 cycles, completed 05/04/2014, with improvement but not normalization of the absolute lymphocyte count  (7) ibrutinib started 05/08/2014, currently at 420mg  daily. Stopped by patient in June, 2019 restarted in  September, 2019, discontinued October 2020  (a) hepatitis B studies 03/06/2014 and 12/31/2016, negative  (8) CT abd/pelvis 10/22/2018 shows retroperitoneal adenopathy, increased splenomegaly  (9) started venetoclax 12/15/2018  PLAN: Rachel [name] continues on the ramp-up 4 of venetoclax without any unusual complications.  She has moderate thrombocytopenia.  She is starting to 100 mg dose today.  I do not think the nausea she is having in the morning is related.  I suggested she go ahead and take the venetoclax in the evening.  If she does then she will not have to interrupt treatment for her colonoscopy, which is a week from today.  She would take at the evening before the colonoscopy, after she is done with her prep, and then the evening of colonoscopy after she is done with the procedures.  She will return here on 01/06/2019 and that will be her second day on the 200 mg dose  She knows to call for any other issue that may develop before the next visit.  Rachel [name]. Tonnia Bardin, MD 12/29/18 10:54 AM Medical Oncology and Hematology [facility] [place], [place] [address] Tel. [phone]    Fax. [phone]   I, Wilburn Mylar, am acting as scribe for Dr. Virgie [name]. Atari Novick.  I, Lurline Del MD, have reviewed the above documentation for accuracy and completeness, and I agree with the above.

## 2018-12-29 ENCOUNTER — Inpatient Hospital Stay: Payer: Medicare Other | Attending: Oncology | Admitting: Oncology

## 2018-12-29 ENCOUNTER — Other Ambulatory Visit: Payer: Self-pay

## 2018-12-29 ENCOUNTER — Inpatient Hospital Stay: Payer: Medicare Other

## 2018-12-29 VITALS — BP 115/65 | HR 68 | Temp 98.0°F | Resp 18 | Ht <= 58 in | Wt 113.9 lb

## 2018-12-29 VITALS — BP 100/58 | HR 69 | Temp 98.3°F | Resp 16

## 2018-12-29 DIAGNOSIS — D696 Thrombocytopenia, unspecified: Secondary | ICD-10-CM | POA: Diagnosis not present

## 2018-12-29 DIAGNOSIS — Z95828 Presence of other vascular implants and grafts: Secondary | ICD-10-CM

## 2018-12-29 DIAGNOSIS — R110 Nausea: Secondary | ICD-10-CM | POA: Insufficient documentation

## 2018-12-29 DIAGNOSIS — C9110 Chronic lymphocytic leukemia of B-cell type not having achieved remission: Secondary | ICD-10-CM | POA: Diagnosis not present

## 2018-12-29 DIAGNOSIS — Z79899 Other long term (current) drug therapy: Secondary | ICD-10-CM | POA: Diagnosis not present

## 2018-12-29 DIAGNOSIS — C8300 Small cell B-cell lymphoma, unspecified site: Secondary | ICD-10-CM | POA: Diagnosis not present

## 2018-12-29 DIAGNOSIS — D649 Anemia, unspecified: Secondary | ICD-10-CM | POA: Diagnosis not present

## 2018-12-29 DIAGNOSIS — Z8572 Personal history of non-Hodgkin lymphomas: Secondary | ICD-10-CM | POA: Diagnosis not present

## 2018-12-29 LAB — CBC WITH DIFFERENTIAL/PLATELET
Abs Immature Granulocytes: 0.07 10*3/uL (ref 0.00–0.07)
Basophils Absolute: 0 10*3/uL (ref 0.0–0.1)
Basophils Relative: 1 %
Eosinophils Absolute: 0 10*3/uL (ref 0.0–0.5)
Eosinophils Relative: 1 %
HCT: 35.1 % — ABNORMAL LOW (ref 36.0–46.0)
Hemoglobin: 11.9 g/dL — ABNORMAL LOW (ref 12.0–15.0)
Immature Granulocytes: 2 %
Lymphocytes Relative: 35 %
Lymphs Abs: 1.4 10*3/uL (ref 0.7–4.0)
MCH: 30.3 pg (ref 26.0–34.0)
MCHC: 33.9 g/dL (ref 30.0–36.0)
MCV: 89.3 fL (ref 80.0–100.0)
Monocytes Absolute: 0.6 10*3/uL (ref 0.1–1.0)
Monocytes Relative: 14 %
Neutro Abs: 1.9 10*3/uL (ref 1.7–7.7)
Neutrophils Relative %: 47 %
Platelets: 82 10*3/uL — ABNORMAL LOW (ref 150–400)
RBC: 3.93 MIL/uL (ref 3.87–5.11)
RDW: 13.2 % (ref 11.5–15.5)
WBC: 4 10*3/uL (ref 4.0–10.5)
nRBC: 0 % (ref 0.0–0.2)

## 2018-12-29 LAB — URIC ACID: Uric Acid, Serum: 6.4 mg/dL (ref 2.5–7.1)

## 2018-12-29 LAB — COMPREHENSIVE METABOLIC PANEL
ALT: 16 U/L (ref 0–44)
AST: 24 U/L (ref 15–41)
Albumin: 4.2 g/dL (ref 3.5–5.0)
Alkaline Phosphatase: 77 U/L (ref 38–126)
Anion gap: 10 (ref 5–15)
BUN: 17 mg/dL (ref 8–23)
CO2: 29 mmol/L (ref 22–32)
Calcium: 12 mg/dL — ABNORMAL HIGH (ref 8.9–10.3)
Chloride: 103 mmol/L (ref 98–111)
Creatinine, Ser: 1.33 mg/dL — ABNORMAL HIGH (ref 0.44–1.00)
GFR calc Af Amer: 47 mL/min — ABNORMAL LOW (ref >60)
GFR calc non Af Amer: 40 mL/min — ABNORMAL LOW (ref >60)
Glucose, Bld: 107 mg/dL — ABNORMAL HIGH (ref 70–99)
Potassium: 3.6 mmol/L (ref 3.5–5.1)
Sodium: 142 mmol/L (ref 135–145)
Total Bilirubin: 0.5 mg/dL (ref 0.3–1.2)
Total Protein: 6.3 g/dL — ABNORMAL LOW (ref 6.5–8.1)

## 2018-12-29 LAB — LACTATE DEHYDROGENASE: LDH: 216 U/L — ABNORMAL HIGH (ref 98–192)

## 2018-12-29 LAB — PHOSPHORUS: Phosphorus: 3.3 mg/dL (ref 2.5–4.6)

## 2018-12-29 MED ORDER — HEPARIN SOD (PORK) LOCK FLUSH 100 UNIT/ML IV SOLN
500.0000 [IU] | Freq: Once | INTRAVENOUS | Status: AC
  Administered 2018-12-29: 13:00:00 500 [IU] via INTRAVENOUS
  Filled 2018-12-29: qty 5

## 2018-12-29 MED ORDER — SODIUM CHLORIDE 0.9 % IV SOLN
INTRAVENOUS | Status: AC
  Administered 2018-12-29: 11:00:00 via INTRAVENOUS
  Filled 2018-12-29 (×2): qty 250

## 2018-12-29 MED ORDER — HEPARIN SOD (PORK) LOCK FLUSH 100 UNIT/ML IV SOLN
500.0000 [IU] | Freq: Once | INTRAVENOUS | Status: DC
  Filled 2018-12-29: qty 5

## 2018-12-29 MED ORDER — SODIUM CHLORIDE 0.9% FLUSH
10.0000 mL | Freq: Once | INTRAVENOUS | Status: AC
  Administered 2018-12-29: 13:00:00 10 mL via INTRAVENOUS
  Filled 2018-12-29: qty 10

## 2018-12-29 MED ORDER — SODIUM CHLORIDE 0.9 % IV SOLN
INTRAVENOUS | Status: DC
  Filled 2018-12-29: qty 250

## 2018-12-29 MED ORDER — SODIUM CHLORIDE 0.9% FLUSH
10.0000 mL | INTRAVENOUS | Status: DC | PRN
  Administered 2018-12-29: 10:00:00 10 mL via INTRAVENOUS
  Filled 2018-12-29: qty 10

## 2018-12-29 NOTE — Patient Instructions (Signed)
Rehydration, Adult Rehydration is the replacement of body fluids and salts and minerals (electrolytes) that are lost during dehydration. Dehydration is when there is not enough fluid or water in the body. This happens when you lose more fluids than you take in. Common causes of dehydration include:  Vomiting.  Diarrhea.  Excessive sweating, such as from heat exposure or exercise.  Taking medicines that cause the body to lose excess fluid (diuretics).  Impaired kidney function.  Not drinking enough fluid.  Certain illnesses or infections.  Certain poorly controlled long-term (chronic) illnesses, such as diabetes, heart disease, and kidney disease.  Symptoms of mild dehydration may include thirst, dry lips and mouth, dry skin, and dizziness. Symptoms of severe dehydration may include increased heart rate, confusion, fainting, and not urinating. You can rehydrate by drinking certain fluids or getting fluids through an IV tube, as told by your health care provider. What are the risks? Generally, rehydration is safe. However, one problem that can happen is taking in too much fluid (overhydration). This is rare. If overhydration happens, it can cause an electrolyte imbalance, kidney failure, or a decrease in salt (sodium) levels in the body. How to rehydrate Follow instructions from your health care provider for rehydration. The kind of fluid you should drink and the amount you should drink depend on your condition.  If directed by your health care provider, drink an oral rehydration solution (ORS). This is a drink designed to treat dehydration that is found in pharmacies and retail stores. ? Make an ORS by following instructions on the package. ? Start by drinking small amounts, about  cup (120 mL) every 5-10 minutes. ? Slowly increase how much you drink until you have taken the amount recommended by your health care provider.  Drink enough clear fluids to keep your urine clear or pale  yellow. If you were instructed to drink an ORS, finish the ORS first, then start slowly drinking other clear fluids. Drink fluids such as: ? Water. Do not drink only water. Doing that can lead to having too little sodium in your body (hyponatremia). ? Ice chips. ? Fruit juice that you have added water to (diluted juice). ? Low-calorie sports drinks.  If you are severely dehydrated, your health care provider may recommend that you receive fluids through an IV tube in the hospital.  Do not take sodium tablets. Doing that can lead to the condition of having too much sodium in your body (hypernatremia). Eating while you rehydrate Follow instructions from your health care provider about what to eat while you rehydrate. Your health care provider may recommend that you slowly begin eating regular foods in small amounts.  Eat foods that contain a healthy balance of electrolytes, such as bananas, oranges, potatoes, tomatoes, and spinach.  Avoid foods that are greasy or contain a lot of fat or sugar.  In some cases, you may get nutrition through a feeding tube that is passed through your nose and into your stomach (nasogastric tube, or NG tube). This may be done if you have uncontrolled vomiting or diarrhea. Beverages to avoid Certain beverages may make dehydration worse. While you rehydrate, avoid:  Alcohol.  Caffeine.  Drinks that contain a lot of sugar. These include: ? High-calorie sports drinks. ? Fruit juice that is not diluted. ? Soda.  Check nutrition labels to see how much sugar or caffeine a beverage contains. Signs of dehydration recovery You may be recovering from dehydration if:  You are urinating more often than before you started   rehydrating.  Your urine is clear or pale yellow.  Your energy level improves.  You vomit less frequently.  You have diarrhea less frequently.  Your appetite improves or returns to normal.  You feel less dizzy or less light-headed.  Your  skin tone and color start to look more normal. Contact a health care provider if:  You continue to have symptoms of mild dehydration, such as: ? Thirst. ? Dry lips. ? Slightly dry mouth. ? Dry, warm skin. ? Dizziness.  You continue to vomit or have diarrhea. Get help right away if:  You have symptoms of dehydration that get worse.  You feel: ? Confused. ? Weak. ? Like you are going to faint.  You have not urinated in 6-8 hours.  You have very dark urine.  You have trouble breathing.  Your heart rate while sitting still is over 100 beats a minute.  You cannot drink fluids without vomiting.  You have vomiting or diarrhea that: ? Gets worse. ? Does not go away.  You have a fever. This information is not intended to replace advice given to you by your health care provider. Make sure you discuss any questions you have with your health care provider. Document Released: 05/05/2011 Document Revised: 01/23/2017 Document Reviewed: 04/06/2015 Elsevier Patient Education  2020 [employer] Inc[employer]  Coronavirus (COVID-19) Are you at risk?  Are you at risk for the Coronavirus (COVID-19)?  To be considered HIGH RISK for Coronavirus (COVID-19), you have to meet the following criteria:  . Traveled to [place], [place], [place], [place] or [place]; or in the [place] to [place], [place], [place], or [place]; and have fever, cough, and shortness of breath within the last 2 weeks of travel OR . Been in close contact with a person diagnosed with COVID-19 within the last 2 weeks and have fever, cough, and shortness of breath . IF YOU DO NOT MEET THESE CRITERIA, YOU ARE CONSIDERED LOW RISK FOR COVID-19.  What to do if you are HIGH RISK for COVID-19?  . If you are having a medical emergency, call 911. . Seek medical care right away. Before you go to a doctor's office, urgent care or emergency department, call ahead and tell them about your recent travel, contact with someone diagnosed  with COVID-19, and your symptoms. You should receive instructions from your physician's office regarding next steps of care.  . When you arrive at healthcare provider, tell the healthcare staff immediately you have returned from visiting [place], [place], [place], [place] or [place]; or traveled in the [place] to [place], [place], [place], or [place]; in the last two weeks or you have been in close contact with a person diagnosed with COVID-19 in the last 2 weeks.   . Tell the health care staff about your symptoms: fever, cough and shortness of breath. . After you have been seen by a medical provider, you will be either: o Tested for (COVID-19) and discharged home on quarantine except to seek medical care if symptoms worsen, and asked to  - Stay home and avoid contact with others until you get your results (4-5 days)  - Avoid travel on public transportation if possible (such as bus, train, or airplane) or o Sent to the Emergency Department by EMS for evaluation, COVID-19 testing, and possible admission depending on your condition and test results.  What to do if you are LOW RISK for COVID-19?  Reduce your risk of any infection by using the same precautions   used for avoiding the common cold or flu:  . Wash your hands often with soap and warm water for at least 20 seconds.  If soap and water are not readily available, use an alcohol-based hand sanitizer with at least 60% alcohol.  . If coughing or sneezing, cover your mouth and nose by coughing or sneezing into the elbow areas of your shirt or coat, into a tissue or into your sleeve (not your hands). . Avoid shaking hands with others and consider head nods or verbal greetings only. . Avoid touching your eyes, nose, or mouth with unwashed hands.  . Avoid close contact with people who are sick. . Avoid places or events with large numbers of people in one location, like concerts or sporting events. . Carefully consider travel plans you have  or are making. . If you are planning any travel outside or inside the [place], visit the CDC's Travelers' Health webpage for the latest health notices. . If you have some symptoms but not all symptoms, continue to monitor at home and seek medical attention if your symptoms worsen. . If you are having a medical emergency, call 911.   ADDITIONAL HEALTHCARE OPTIONS FOR PATIENTS  [facility] / e-Visit: [email or web address]         [facility]: [phone]  [facility]: [phone]                   [facility]: [phone]   

## 2018-12-29 NOTE — Progress Notes (Signed)
Patient requested to be left accessed because she may need fluids today. Dr Mendel Ryder nurse Tammi RN is notified that needle was left in so that if she doesn't need fluid she can take needle out before patient leaves.

## 2018-12-30 ENCOUNTER — Telehealth: Payer: Self-pay | Admitting: Oncology

## 2018-12-30 NOTE — Telephone Encounter (Signed)
I talk with patient regarding 11/12  and she said she did not want to schedule ivf

## 2018-12-31 ENCOUNTER — Other Ambulatory Visit (HOSPITAL_COMMUNITY)
Admission: RE | Admit: 2018-12-31 | Discharge: 2018-12-31 | Disposition: A | Discharge status: Home / Self Care | Payer: Medicare Other | Source: Ambulatory Visit | Attending: Obstetrics | Admitting: Obstetrics

## 2018-12-31 ENCOUNTER — Encounter: Payer: Self-pay | Admitting: Obstetrics

## 2018-12-31 ENCOUNTER — Ambulatory Visit (INDEPENDENT_AMBULATORY_CARE_PROVIDER_SITE_OTHER): Payer: Medicare Other | Admitting: Obstetrics

## 2018-12-31 ENCOUNTER — Other Ambulatory Visit: Payer: Self-pay

## 2018-12-31 ENCOUNTER — Telehealth: Payer: Self-pay | Admitting: *Deleted

## 2018-12-31 VITALS — BP 102/66 | HR 82 | Ht <= 58 in | Wt 113.8 lb

## 2018-12-31 DIAGNOSIS — R87612 Low grade squamous intraepithelial lesion on cytologic smear of cervix (LGSIL): Secondary | ICD-10-CM | POA: Insufficient documentation

## 2018-12-31 DIAGNOSIS — Z01419 Encounter for gynecological examination (general) (routine) without abnormal findings: Secondary | ICD-10-CM | POA: Insufficient documentation

## 2018-12-31 DIAGNOSIS — C9110 Chronic lymphocytic leukemia of B-cell type not having achieved remission: Secondary | ICD-10-CM

## 2018-12-31 DIAGNOSIS — Z1151 Encounter for screening for human papillomavirus (HPV): Secondary | ICD-10-CM | POA: Diagnosis not present

## 2018-12-31 DIAGNOSIS — N898 Other specified noninflammatory disorders of vagina: Secondary | ICD-10-CM

## 2018-12-31 DIAGNOSIS — F5101 Primary insomnia: Secondary | ICD-10-CM

## 2018-12-31 DIAGNOSIS — Z780 Asymptomatic menopausal state: Secondary | ICD-10-CM

## 2018-12-31 DIAGNOSIS — Z0000 Encounter for general adult medical examination without abnormal findings: Secondary | ICD-10-CM

## 2018-12-31 DIAGNOSIS — Z124 Encounter for screening for malignant neoplasm of cervix: Secondary | ICD-10-CM | POA: Diagnosis not present

## 2018-12-31 MED ORDER — TRAZODONE HCL 100 MG PO TABS: 100.0000 mg | ORAL_TABLET | Freq: Every day | ORAL | 3 refills | Status: DC

## 2018-12-31 NOTE — Progress Notes (Signed)
Pt is in the office for annual, last pap 05-19-17.  Last mammogram 10-05-2018 GAD7= 10

## 2018-12-31 NOTE — Progress Notes (Addendum)
Subjective:        Celes A Sibrian is a 68 y.o. female here for a routine exam.  Current complaints: Difficulty staying asleep.    Personal health questionnaire:  Is patient Ashkenazi Jewish, have a family history of breast and/or ovarian cancer: no Is there a family history of uterine cancer diagnosed at age < 15, gastrointestinal cancer, urinary tract cancer, family member who is a Field seismologist syndrome-associated carrier: no Is the patient overweight and hypertensive, family history of diabetes, personal history of gestational diabetes, preeclampsia or PCOS: no Is patient over 41, have PCOS,  family history of premature CHD under age 67, diabetes, smoke, have hypertension or peripheral artery disease:  no At any time, has a partner hit, kicked or otherwise hurt or frightened you?: no Over the past 2 weeks, have you felt down, depressed or hopeless?: no Over the past 2 weeks, have you felt little interest or pleasure in doing things?:no   Gynecologic History No LMP recorded. Patient is postmenopausal. Contraception: post menopausal status Last Pap: 2019. Results were: normal Last mammogram: 2020. Results were: normal  Obstetric History OB History  Gravida Para Term Preterm AB Living  3         3  SAB TAB Ectopic Multiple Live Births          3    # Outcome Date GA Lbr Len/2nd Weight Sex Delivery Anes PTL Lv  3 Gravida 09/02/76    M    LIV  2 Gravida 02/12/74    M    LIV  1 Gravida 10/08/72    M   Y     Past Medical History:  Diagnosis Date  . 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 Diverticulitis   . Leukemia ([place])   . Lower back pain     Past Surgical History:  Procedure Laterality Date  . BREAST SURGERY  2015   left breast bx  . BUNIONECTOMY     right foot  . lining of uterus removed    . OTHER SURGICAL HISTORY     removal of uterine lining  . TONSILLECTOMY       Current Outpatient Medications:  .  acetaminophen (TYLENOL) 500 MG tablet, Take 500-1,000 mg  by mouth every 6 (six) hours as needed for headache. , Disp: , Rfl:  .  acyclovir (ZOVIRAX) 400 MG tablet, Take 1 tablet (400 mg total) by mouth 2 (two) times daily., Disp: 180 tablet, Rfl: 3 .  allopurinol (ZYLOPRIM) 300 MG tablet, Take 1 tablet (300 mg total) by mouth daily., Disp: 90 tablet, Rfl: 3 .  cholecalciferol (VITAMIN D3) 25 MCG (1000 UT) tablet, Take 1,000 Units by mouth daily., Disp: , Rfl:  .  Estradiol (ELESTRIN) 0.52 MG/0.87 GM (0.06%) GEL, APPLY 2 PUMPS THINLY TO UPPER ARM DAILY AT BEDTIME, Disp: 6 Bottle, Rfl: 3 .  levocetirizine (XYZAL) 5 MG tablet, Take 5 mg by mouth every evening., Disp: , Rfl:  .  lidocaine-prilocaine (EMLA) cream, Apply a nickel size amount to skin on top of port- cover with saran wrap at least 1 hour before access, Disp: 30 g, Rfl: 0 .  PROAIR HFA 108 (90 Base) MCG/ACT inhaler, USE 2 INHALATIONS EVERY 6 HOURS AS NEEDED FOR WHEEZING OR SHORTNESS OF BREATH (Patient taking differently: Inhale 2 puffs into the lungs every 6 (six) hours as needed for wheezing or shortness of breath. ), Disp: 8.5 g, Rfl: 0 .  prochlorperazine (COMPAZINE) 5 MG tablet, Take 1-2 tablets (5-10 mg  total) by mouth every 6 (six) hours as needed for nausea or vomiting., Disp: 60 tablet, Rfl: 1 .  progesterone (PROMETRIUM) 200 MG capsule, TAKE 1 CAPSULE DAILY, Disp: 90 capsule, Rfl: 3 .  promethazine (PHENERGAN) 25 MG tablet, Take 1 tablet (25 mg total) by mouth every 6 (six) hours as needed for nausea or vomiting., Disp: 10 tablet, Rfl: 0 .  traZODone (DESYREL) 100 MG tablet, Take 1 tablet (100 mg total) by mouth at bedtime., Disp: 90 tablet, Rfl: 3 .  venetoclax 10 & 50 & 100 MG TBPK, Week1: Take 20mg  once daily. [id number]: Take 50mg  once daily. [id number]: Take 100mg  once daily. [id number]: Take 200mg  once daily. Take w food & water, Disp: 42 each, Rfl: 0 .  ciprofloxacin (CIPRO) 500 MG tablet, Take 1 tablet (500 mg total) by mouth 2 (two) times daily. (Patient not taking: Reported on 12/31/2018), Disp: 20  tablet, Rfl: 0 .  metroNIDAZOLE (FLAGYL) 500 MG tablet, Take 1 tablet (500 mg total) by mouth 2 (two) times daily. (Patient not taking: Reported on 12/31/2018), Disp: 20 tablet, Rfl: 0 .  traMADol (ULTRAM) 50 MG tablet, Take 1 tablet (50 mg total) by mouth every 6 (six) hours as needed for severe pain. (Patient not taking: Reported on 12/31/2018), Disp: 15 tablet, Rfl: 0 Allergies  Allergen Reactions  . Penicillins Anaphylaxis    "stopped breathing" "drops my heartbeat" "I just pass out"    Social History   Tobacco Use  . Smoking status: Never Smoker  . Smokeless tobacco: Never Used  Substance Use Topics  . Alcohol use: Yes    Alcohol/week: 0.0 standard drinks    Comment: socially    Family History  Problem Relation Age of Onset  . Depression Son       Review of Systems  Constitutional: negative for fatigue and weight loss Respiratory: negative for cough and wheezing Cardiovascular: negative for chest pain, fatigue and palpitations Gastrointestinal: negative for abdominal pain and change in bowel habits Musculoskeletal:negative for myalgias Neurological: negative for gait problems and tremors Behavioral/Psych: negative for abusive relationship, depression Endocrine: negative for temperature intolerance    Genitourinary:negative for abnormal menstrual periods, genital lesions, hot flashes, sexual problems and vaginal discharge Integument/breast: negative for breast lump, breast tenderness, nipple discharge and skin lesion(s)    Objective:       BP 102/66   Pulse 82   Ht 4\' 10"  (1.473 m)   Wt 113 lb 12.8 oz (51.6 kg)   BMI 23.78 kg/m  General:   alert  Skin:   no rash or abnormalities  Lungs:   clear to auscultation bilaterally  Heart:   regular rate and rhythm, S1, S2 normal, no murmur, click, rub or gallop  Breasts:   normal without suspicious masses, skin or nipple changes or axillary nodes  Abdomen:  normal findings: no organomegaly, soft, non-tender and no hernia   Pelvis:  External genitalia: normal general appearance Urinary system: urethral meatus normal and bladder without fullness, nontender Vaginal: normal without tenderness, induration or masses Cervix: normal appearance Adnexa: normal bimanual exam Uterus: anteverted and non-tender, normal size   Lab Review Urine pregnancy test Labs reviewed yes Radiologic studies reviewed yes  50% of 25 min visit spent on counseling and coordination of care.   Assessment:     1. Encounter for routine gynecological examination with Papanicolaou smear of cervix Rx: - Cytology - PAP( [facility])  2. Vaginal discharge Rx: - Cervicovaginal ancillary only( [place])  3. Primary insomnia Rx: -  traZODone (DESYREL) 100 MG tablet; Take 1 tablet (100 mg total) by mouth at bedtime.  Dispense: 90 tablet; Refill: 3  4. CLL (chronic lymphocytic leukemia) ([place]) - managed by medical oncologist - clinically stable  5. Postmenopause - doing well  6. Routine adult health maintenance Rx: - cholecalciferol (VITAMIN D3) 25 MCG (1000 UT) tablet; Take 1,000 Units by mouth daily. - levocetirizine (XYZAL) 5 MG tablet; Take 5 mg by mouth every evening.   Plan:    Education reviewed: calcium supplements, depression evaluation, low fat, low cholesterol diet, safe sex/STD prevention, self breast exams, skin cancer screening and weight bearing exercise. Follow up in: 1 year.   Meds ordered this encounter  Medications  . traZODone (DESYREL) 100 MG tablet    Sig: Take 1 tablet (100 mg total) by mouth at bedtime.    Dispense:  90 tablet    Refill:  3     [name], MD 12/31/2018 10:28 AM

## 2018-12-31 NOTE — Telephone Encounter (Signed)
This RN spoke with pt per her VM wanting to let Dr Jana Hakim " know that I felt weak this morning and am itchy all over "  This RN discussed above with weakness shortly after waking but then she was able to do her ADL's including going to her GYN for her annual visit.  She states " every now and then I feel like I have ants in my pants ". With clarification- she means body wide itching.  She does not have a rash.  She states she does feel very nervous at times as well.  She has occasional nausea with small amounts of vomiting.  She notices " pain in my stomach in different places sometimes "  This RN discussed above including possible itching related to multiple factors including nervousness and changed in the weather.  Note Rachel [name] has has itching before and this is not a new issue since starting the venetoclax. She is using body cream for relief.  She is able to hydrate well getting in 58 ounces daily.  This RN discussed general issues relating to changes in her body as it adjust to a new medication.  Goal is to increase to maximum tolerated dose and that she needs to keep a diary of symptoms for review at next visit prior to increase of medication.  She asked about eating oranges on venetoclax " because on the [place] I wasn't allowed to and I really really miss my mandarin oranges "  This RN verified she may have mandarin oranges- informed her to not eat grapefruit or [place] oranges or products made with it.  Rachel [name] verbalized understanding and no further concerns at this time.

## 2019-01-03 DIAGNOSIS — R87612 Low grade squamous intraepithelial lesion on cytologic smear of cervix (LGSIL): Secondary | ICD-10-CM | POA: Diagnosis not present

## 2019-01-04 LAB — CERVICOVAGINAL ANCILLARY ONLY
Bacterial Vaginitis (gardnerella): NEGATIVE
Candida Glabrata: NEGATIVE
Comment: NEGATIVE
Comment: NEGATIVE
Comment: NEGATIVE
Trichomonas: NEGATIVE

## 2019-01-05 ENCOUNTER — Ambulatory Visit: Payer: Medicare Other | Admitting: Adult Health

## 2019-01-05 ENCOUNTER — Other Ambulatory Visit: Payer: Medicare Other

## 2019-01-05 DIAGNOSIS — K5730 Diverticulosis of large intestine without perforation or abscess without bleeding: Secondary | ICD-10-CM | POA: Diagnosis not present

## 2019-01-05 DIAGNOSIS — Z1211 Encounter for screening for malignant neoplasm of colon: Secondary | ICD-10-CM | POA: Diagnosis not present

## 2019-01-05 NOTE — Progress Notes (Signed)
Rachel [name]  [phone] [phone]) [phone]     ID: Rachel [name]   DOB: 08/11/1950  MR#: [id number]  [id number]  Patient Care Team: Leeroy Cha, MD as PCP - General (Internal Medicine) Jhett Fretwell, Virgie Dad, MD as Consulting Physician (Oncology) [name], MD as Consulting Physician (Obstetrics and Gynecology) OTHER MD:  CHIEF COMPLAINT: Chronic lymphocytic leukemia  CURRENT TREATMENT: venetoclax, ibrutinib   INTERVAL HISTORY: Rachel [name] returns today for follow-up and treatment of her chronic lymphocytic leukemia accompanied by her husband Rachel [name].  She continues on her venetoclax ramp-up.  She took 100 mg daily all of last week.  She started the 200 mg dose yesterday.  REVIEW OF SYSTEMS: She is having some problems of concern.  She feels very sleepy.  She sleeps all night and then she feels sleepy during the day.  She feels chilled.  She is always cold even when it is warm outside she says.  She says she has lost weight without trying.  (Our flowsheets show her weight is stable to increased as compared to a year ago).  She feels forgetful.  She thinks all of these problems are due to the venetoclax.  In addition, since going off the ibrutinib her warts have gotten a lot worse.  They are painful.  She really would like to be on the ibrutinib for the warts if nothing else.  She has not had any fever, rash, bleeding, diarrhea, or pruritus.  She has not noted any adenopathy.  A detailed review of systems was otherwise stable.    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 She has been treated with Rituxan, ofatumumab, cladribine, bendamustine and currently with ibrutinib, with details summarized below.   PAST MEDICAL HISTORY: Past Medical History:  Diagnosis Date  . 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 Diverticulitis   .  Leukemia ([place])   . Lower back pain     PAST SURGICAL HISTORY: Past Surgical History:  Procedure Laterality Date  . BREAST SURGERY  2015   left breast bx  . BUNIONECTOMY     right foot  . lining of uterus removed    . OTHER SURGICAL HISTORY     removal of uterine lining  . TONSILLECTOMY      FAMILY HISTORY Family History  Problem Relation Age of Onset  . Depression Son     GYNECOLOGIC HISTORY: Menarche age 14, first live birth age 89, she is [place] P3. She stopped having periods in 1999 after endometrial stripping. She continues on hormone replacement.   SOCIAL HISTORY: Her husband Rachel [name] is retired from [employer]. Her son Rachel [name] lives in [place]; he works for a [employer]. A second son lives in [place] and has two children. Son Rachel [name] lives in [place]. She has 8 grandchildren, the oldest is 32 and the youngest is 67.  ADVANCED DIRECTIVES: In place   HEALTH MAINTENANCE: Social History   Tobacco Use  . Smoking status: Never Smoker  . Smokeless tobacco: Never Used  Substance Use Topics  . Alcohol use: Yes    Alcohol/week: 0.0 standard drinks    Comment: socially  . Drug use: No     Colonoscopy: due  PAP: December 2012/ [name]  Bone density: Due  Lipid panel: per Dr Drema Dallas  Allergies  Allergen Reactions  . Penicillins Anaphylaxis    "stopped breathing" "drops my heartbeat" "I just pass out"    Current Outpatient  Medications  Medication Sig Dispense Refill  . acetaminophen (TYLENOL) 500 MG tablet Take 500-1,000 mg by mouth every 6 (six) hours as needed for headache.     Marland Kitchen acyclovir (ZOVIRAX) 400 MG tablet Take 1 tablet (400 mg total) by mouth 2 (two) times daily. 180 tablet 3  . allopurinol (ZYLOPRIM) 300 MG tablet Take 1 tablet (300 mg total) by mouth daily. 90 tablet 3  . cholecalciferol (VITAMIN D3) 25 MCG (1000 UT) tablet Take 1,000 Units by mouth daily.    . Estradiol (ELESTRIN) 0.52 MG/0.87 GM (0.06%) GEL APPLY 2 PUMPS THINLY TO UPPER ARM  DAILY AT BEDTIME 6 Bottle 3  . Ibrutinib 280 MG TABS Take 280 mg by mouth daily. Take with water at approximately the same time each day 84 tablet 4  . lidocaine-prilocaine (EMLA) cream Apply a nickel size amount to skin on top of port- cover with saran wrap at least 1 hour before access 30 g 0  . prochlorperazine (COMPAZINE) 5 MG tablet TAKE 1 TO 2 TABLETS(5 TO 10 MG) BY MOUTH EVERY 6 HOURS AS NEEDED FOR NAUSEA OR VOMITING 60 tablet 1  . progesterone (PROMETRIUM) 200 MG capsule TAKE 1 CAPSULE DAILY 90 capsule 3  . traZODone (DESYREL) 100 MG tablet Take 1 tablet (100 mg total) by mouth at bedtime. 90 tablet 3  . venetoclax 100 MG TABS Take 100 mg by mouth daily. Take with food and water at approximately the same time each day. 30 tablet 6   No current facility-administered medications for this visit.     OBJECTIVE: Middle-aged white woman who appears stated age  68:   01/06/19 1029  BP: 122/70  Pulse: 61  Resp: 18  SpO2: 100%   Wt Readings from Last 3 Encounters:  01/06/19 114 lb (51.7 kg)  12/31/18 113 lb 12.8 oz (51.6 kg)  12/29/18 113 lb 14.4 oz (51.7 kg)   Body mass index is 23.83 kg/m.    ECOG FS:1 - Symptomatic but completely ambulatory  Sclerae unicteric, EOMs intact Wearing a mask No cervical or supraclavicular adenopathy, no axillary or inguinal adenopathy Lungs no rales or rhonchi Heart regular rate and rhythm Abd soft, nontender, positive bowel sounds MSK no focal spinal tenderness, no upper extremity lymphedema Neuro: nonfocal, well oriented, appropriate affect Breasts: Deferred   LAB RESULTS:  Lab Results  Component Value Date   WBC 3.5 (L) 01/06/2019   NEUTROABS 1.9 01/06/2019   HGB 11.2 (L) 01/06/2019   HCT 33.5 (L) 01/06/2019   MCV 90.1 01/06/2019   PLT 89 (L) 01/06/2019      Chemistry      Component Value Date/Time   NA 145 01/06/2019 0908   NA 140 12/31/2016 1406   K 3.3 (L) 01/06/2019 0908   K 3.6 12/31/2016 1406   CL 106 01/06/2019  0908   CL 101 12/15/2011 1322   CO2 28 01/06/2019 0908   CO2 24 12/31/2016 1406   BUN 15 01/06/2019 0908   BUN 12.8 12/31/2016 1406   CREATININE 1.23 (H) 01/06/2019 0908   CREATININE 0.82 12/15/2018 1425   CREATININE 0.9 12/31/2016 1406      Component Value Date/Time   CALCIUM 10.7 (H) 01/06/2019 0908   CALCIUM 8.8 12/31/2016 1406   ALKPHOS 75 01/06/2019 0908   ALKPHOS 71 12/31/2016 1406   AST 18 01/06/2019 0908   AST 18 12/15/2018 1425   AST 18 12/31/2016 1406   ALT 14 01/06/2019 0908   ALT 17 12/15/2018 1425   ALT 11  12/31/2016 1406   BILITOT 0.4 01/06/2019 0908   BILITOT 0.3 12/15/2018 1425   BILITOT 0.49 12/31/2016 1406      STUDIES: No results found.   ASSESSMENT: 68 y.o.  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 rigors 02/27/2014: received IV vancomycin and levaquin briefly, then levaquin po (pcn allergy)  (6) bendamustine/ rituximab for 6 cycles, completed 05/04/2014, with improvement but not normalization of the absolute lymphocyte count  (7) ibrutinib started 05/08/2014, currently at 420mg  daily. Stopped by patient in June, 2019 restarted in September, 2019, discontinued October 2020  (a) hepatitis B studies 03/06/2014 and 12/31/2016, negative  (b) ibrutinib restarted 01/06/2019 in conjunction with venetoclax  (8) CT  abd/pelvis 10/22/2018 shows retroperitoneal adenopathy, increased splenomegaly  (9) started venetoclax 12/15/2018  (a) uneventful ramp-up to the 100 mg a day dose, then held at that dose due to symptoms  PLAN: Rachel [name] is having multiple symptoms which she attributes to the venetoclax, as detailed above.  In addition she is having real problems from her warts, which were quite well controlled on the ibrutinib.  We reviewed the fact that she seems to be having an excellent response to the venetoclax, whereas with the ibrutinib she was having splenomegaly and retroperitoneal adenopathy as well as higher white cell count.  After much discussion we decided we are going to try to keep on the venetoclax but not increase the dose further.  She will continue at 100 mg daily for now and I have put that prescription in to our [facility].  I am going to couple this with 280 mg of ibrutinib daily.  There is data for using both drugs together without significant increase in side effects.  Hopefully with this combination she will obtain better disease control.  If not we will have to reassess.  She will see me again in 2 weeks.  At that time we can make a further determination of whether this plan will work for her.  I am still hopeful that at some point we will be able to increase the venetoclax dose further  She knows to call for any other issue that may develop before her next visit here.     Virgie Dad. Donnelle Rubey, MD 01/06/19 5:56 PM Medical Oncology and Hematology [facility] [place], [place] [address] Tel. [phone]    Fax. [phone]   I, Wilburn Mylar, am acting as scribe for Dr. Virgie Dad. Yazmeen Woolf.  I, Lurline Del MD, have reviewed the above documentation for accuracy and completeness, and I agree with the above.

## 2019-01-06 ENCOUNTER — Inpatient Hospital Stay: Payer: Medicare Other

## 2019-01-06 ENCOUNTER — Telehealth: Payer: Self-pay | Admitting: Pharmacist

## 2019-01-06 ENCOUNTER — Inpatient Hospital Stay (HOSPITAL_BASED_OUTPATIENT_CLINIC_OR_DEPARTMENT_OTHER): Payer: Medicare Other | Admitting: Oncology

## 2019-01-06 ENCOUNTER — Other Ambulatory Visit: Payer: Self-pay | Admitting: Oncology

## 2019-01-06 ENCOUNTER — Other Ambulatory Visit: Payer: Self-pay

## 2019-01-06 VITALS — BP 122/70 | HR 61 | Resp 18 | Ht <= 58 in | Wt 114.0 lb

## 2019-01-06 DIAGNOSIS — Z79899 Other long term (current) drug therapy: Secondary | ICD-10-CM | POA: Diagnosis not present

## 2019-01-06 DIAGNOSIS — C9110 Chronic lymphocytic leukemia of B-cell type not having achieved remission: Secondary | ICD-10-CM

## 2019-01-06 DIAGNOSIS — Z8572 Personal history of non-Hodgkin lymphomas: Secondary | ICD-10-CM | POA: Diagnosis not present

## 2019-01-06 DIAGNOSIS — C8300 Small cell B-cell lymphoma, unspecified site: Secondary | ICD-10-CM

## 2019-01-06 DIAGNOSIS — D696 Thrombocytopenia, unspecified: Secondary | ICD-10-CM | POA: Diagnosis not present

## 2019-01-06 DIAGNOSIS — R110 Nausea: Secondary | ICD-10-CM | POA: Diagnosis not present

## 2019-01-06 DIAGNOSIS — Z95828 Presence of other vascular implants and grafts: Secondary | ICD-10-CM

## 2019-01-06 DIAGNOSIS — D649 Anemia, unspecified: Secondary | ICD-10-CM | POA: Diagnosis not present

## 2019-01-06 LAB — CBC WITH DIFFERENTIAL/PLATELET
Abs Immature Granulocytes: 0.04 10*3/uL (ref 0.00–0.07)
Basophils Absolute: 0 10*3/uL (ref 0.0–0.1)
Basophils Relative: 1 %
Eosinophils Absolute: 0.1 10*3/uL (ref 0.0–0.5)
Eosinophils Relative: 2 %
HCT: 33.5 % — ABNORMAL LOW (ref 36.0–46.0)
Hemoglobin: 11.2 g/dL — ABNORMAL LOW (ref 12.0–15.0)
Immature Granulocytes: 1 %
Lymphocytes Relative: 31 %
Lymphs Abs: 1.1 10*3/uL (ref 0.7–4.0)
MCH: 30.1 pg (ref 26.0–34.0)
MCHC: 33.4 g/dL (ref 30.0–36.0)
MCV: 90.1 fL (ref 80.0–100.0)
Monocytes Absolute: 0.5 10*3/uL (ref 0.1–1.0)
Monocytes Relative: 13 %
Neutro Abs: 1.9 10*3/uL (ref 1.7–7.7)
Neutrophils Relative %: 52 %
Platelets: 89 10*3/uL — ABNORMAL LOW (ref 150–400)
RBC: 3.72 MIL/uL — ABNORMAL LOW (ref 3.87–5.11)
RDW: 12.7 % (ref 11.5–15.5)
WBC: 3.5 10*3/uL — ABNORMAL LOW (ref 4.0–10.5)
nRBC: 0 % (ref 0.0–0.2)

## 2019-01-06 LAB — COMPREHENSIVE METABOLIC PANEL
ALT: 14 U/L (ref 0–44)
AST: 18 U/L (ref 15–41)
Albumin: 3.8 g/dL (ref 3.5–5.0)
Alkaline Phosphatase: 75 U/L (ref 38–126)
Anion gap: 11 (ref 5–15)
BUN: 15 mg/dL (ref 8–23)
CO2: 28 mmol/L (ref 22–32)
Calcium: 10.7 mg/dL — ABNORMAL HIGH (ref 8.9–10.3)
Chloride: 106 mmol/L (ref 98–111)
Creatinine, Ser: 1.23 mg/dL — ABNORMAL HIGH (ref 0.44–1.00)
GFR calc Af Amer: 51 mL/min — ABNORMAL LOW (ref >60)
GFR calc non Af Amer: 44 mL/min — ABNORMAL LOW (ref >60)
Glucose, Bld: 107 mg/dL — ABNORMAL HIGH (ref 70–99)
Potassium: 3.3 mmol/L — ABNORMAL LOW (ref 3.5–5.1)
Sodium: 145 mmol/L (ref 135–145)
Total Bilirubin: 0.4 mg/dL (ref 0.3–1.2)
Total Protein: 5.8 g/dL — ABNORMAL LOW (ref 6.5–8.1)

## 2019-01-06 LAB — URIC ACID: Uric Acid, Serum: 5 mg/dL (ref 2.5–7.1)

## 2019-01-06 LAB — LACTATE DEHYDROGENASE: LDH: 201 U/L — ABNORMAL HIGH (ref 98–192)

## 2019-01-06 LAB — PHOSPHORUS: Phosphorus: 2.9 mg/dL (ref 2.5–4.6)

## 2019-01-06 MED ORDER — VENETOCLAX 100 MG PO TABS: 100.0000 mg | ORAL_TABLET | Freq: Every day | ORAL | 6 refills | Status: DC

## 2019-01-06 MED ORDER — VENETOCLAX 100 MG PO TABS: 100.0000 mg | ORAL_TABLET | ORAL | 6 refills | Status: DC

## 2019-01-06 MED ORDER — IBRUTINIB 280 MG PO TABS: 280.0000 mg | ORAL_TABLET | ORAL | 4 refills | Status: DC

## 2019-01-06 MED ORDER — HEPARIN SOD (PORK) LOCK FLUSH 100 UNIT/ML IV SOLN
500.0000 [IU] | Freq: Once | INTRAVENOUS | Status: AC
  Administered 2019-01-06: 500 [IU] via INTRAVENOUS
  Filled 2019-01-06: qty 5

## 2019-01-06 MED ORDER — SODIUM CHLORIDE 0.9% FLUSH
10.0000 mL | INTRAVENOUS | Status: DC | PRN
  Administered 2019-01-06: 10 mL via INTRAVENOUS
  Filled 2019-01-06: qty 10

## 2019-01-06 MED ORDER — IBRUTINIB 280 MG PO TABS: 280.0000 mg | ORAL_TABLET | Freq: Every day | ORAL | 4 refills | Status: DC

## 2019-01-06 NOTE — Telephone Encounter (Signed)
Oral Oncology Pharmacist Encounter  Received request from the [facility] for new prescription for venetoclax as the one sent by MD today was written to take 1 dose for 1 day. New prescription for venetoclax 100 mg tablets, take 1 tablet by mouth once daily with food and water, quantity #30, refills=6, has been e-scribed to [facility].  I also went ahead and resent the ibrutinib prescription [facility] due to the same issue.  Johny Drilling, PharmD, BCPS, BCOP  01/06/2019 2:04 PM Oral Oncology Clinic [phone]

## 2019-01-11 LAB — CYTOLOGY - PAP
Comment: NEGATIVE
Comment: NEGATIVE
Comment: NEGATIVE
HPV 16: NEGATIVE
HPV 18 / 45: NEGATIVE
High risk HPV: POSITIVE — AB

## 2019-01-12 ENCOUNTER — Inpatient Hospital Stay: Payer: Medicare Other

## 2019-01-12 ENCOUNTER — Inpatient Hospital Stay: Payer: Medicare Other | Admitting: Adult Health

## 2019-01-12 MED FILL — VENCLEXTA 100 MG TABS: 100 | 30 days supply | Qty: 30 | Fill #0

## 2019-01-27 ENCOUNTER — Telehealth: Payer: Self-pay | Admitting: Oncology

## 2019-01-27 NOTE — Telephone Encounter (Signed)
Returned patient's phone call regarding rescheduling 12/04 appointment, per patient's request appointment has moved to 12/07.

## 2019-01-28 ENCOUNTER — Inpatient Hospital Stay: Payer: Medicare Other

## 2019-01-28 ENCOUNTER — Inpatient Hospital Stay: Payer: Medicare Other | Admitting: Oncology

## 2019-01-30 NOTE — Progress Notes (Signed)
[facility]  [phone]) [phone] [phone]) [phone]     ID: Rachel [name]   DOB: 01/21/1951  MR#: [id number]  [id number]  Patient Care Team: Leeroy Cha, MD as PCP - General (Internal Medicine) Azar South, Virgie Dad, MD as Consulting Physician (Oncology) [name], MD as Consulting Physician (Obstetrics and Gynecology) OTHER MD:  CHIEF COMPLAINT: Chronic lymphocytic leukemia  CURRENT TREATMENT: venetoclax, ibrutinib   INTERVAL HISTORY: Rachel [name] returns today for follow-up and treatment of her chronic lymphocytic leukemia.  [name] was not able to come with her today.  He just went through TURP and is home with a catheter.  She continues on venetoclax.  She is tolerating the 100 mg a day dose well, with no side effects that she is aware of.  We resumed her ibrutinib at the last visit chiefly because of concerns regarding her warts.  The warts are considerably better.  She also tolerates this with no side effects.  She underwent colonoscopy on 01/05/2019 under Dr. Collene Mares. Recommended repeat in 10 years.   REVIEW OF SYSTEMS: Rachel [name] looks terrific.  She has started doing weights.  She has lost a little bit of weight herself.  She is also walking for exercise.  She has had no fever rash bleeding pruritus or adenopathy.  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 She has been treated with Rituxan, ofatumumab, cladribine, bendamustine and currently with ibrutinib, with details summarized below.   PAST MEDICAL HISTORY: Past Medical History:  Diagnosis Date  . 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 Diverticulitis   . Leukemia ([place])   . Lower back pain     PAST SURGICAL HISTORY: Past Surgical History:  Procedure Laterality Date  . BREAST SURGERY  2015   left  breast bx  . BUNIONECTOMY     right foot  . lining of uterus removed    . OTHER SURGICAL HISTORY     removal of uterine lining  . TONSILLECTOMY      FAMILY HISTORY Family History  Problem Relation Age of Onset  . Depression Son     GYNECOLOGIC HISTORY: Menarche age 61, first live birth age 55, she is [place] P3. She stopped having periods in 1999 after endometrial stripping. She continues on hormone replacement.   SOCIAL HISTORY: [name] is retired from [employer]. Her son [name] lives in [place]; he works for a [employer]. A second son lives in [place] and has two children. Son [name] lives in [place]. She has 8 grandchildren, the oldest is 53 and the youngest is 51.  ADVANCED DIRECTIVES: In place   HEALTH MAINTENANCE: Social History   Tobacco Use  . Smoking status: Never Smoker  . Smokeless tobacco: Never Used  Substance Use Topics  . Alcohol use: Yes    Alcohol/week: 0.0 standard drinks    Comment: socially  . Drug use: No     Colonoscopy: 12/2018 (Dr. Collene Mares), repeat in 10 years  PAP: December 2012/ [facility] Mourning  Bone density: Due  Lipid panel: per Dr Drema Dallas  Allergies  Allergen Reactions  . Penicillins Anaphylaxis    "stopped breathing" "drops my heartbeat" "I just pass out"    Current Outpatient Medications  Medication Sig Dispense Refill  . acyclovir (ZOVIRAX) 400 MG tablet Take 1 tablet (400 mg total) by mouth 2 (two) times daily. 180 tablet 3  .  allopurinol (ZYLOPRIM) 300 MG tablet Take 1 tablet (300 mg total) by mouth daily. 90 tablet 3  . azelastine (ASTELIN) 0.1 % nasal spray Place into both nostrils 2 (two) times daily. Use in each nostril as directed    . Calcium Citrate-Vitamin D 200-250 MG-UNIT TABS Take 1 tablet by mouth 4 (four) times daily.    . Estradiol (ELESTRIN) 0.52 MG/0.87 GM (0.06%) GEL APPLY 2 PUMPS THINLY TO UPPER ARM DAILY AT BEDTIME 6 Bottle 3  . famotidine (PEPCID) 20 MG tablet Take 20 mg by mouth 2 (two) times daily.     . Ibrutinib 280 MG TABS Take 280 mg by mouth daily. Take with water at approximately the same time each day 84 tablet 4  . lidocaine-prilocaine (EMLA) cream Apply a nickel size amount to skin on top of port- cover with saran wrap at least 1 hour before access 30 g 0  . montelukast (SINGULAIR) 10 MG tablet Take 10 mg by mouth at bedtime.    . progesterone (PROMETRIUM) 200 MG capsule TAKE 1 CAPSULE DAILY (Patient taking differently: 200 mg. ) 90 capsule 3  . Specialty Vitamins Products (MAGNESIUM, AMINO ACID CHELATE,) 133 MG tablet Take by mouth 2 (two) times daily.    . traZODone (DESYREL) 100 MG tablet Take 1 tablet (100 mg total) by mouth at bedtime. 90 tablet 3  . venetoclax 100 MG TABS Take 100 mg by mouth daily. Take with food and water at approximately the same time each day. 30 tablet 6  . VITAMIN D PO Take 5,000 Units by mouth daily.     Marland Kitchen acetaminophen (TYLENOL) 500 MG tablet Take 500-1,000 mg by mouth every 6 (six) hours as needed for headache.     . prochlorperazine (COMPAZINE) 5 MG tablet TAKE 1 TO 2 TABLETS(5 TO 10 MG) BY MOUTH EVERY 6 HOURS AS NEEDED FOR NAUSEA OR VOMITING (Patient not taking: Reported on 01/31/2019) 60 tablet 1   No current facility-administered medications for this visit.     OBJECTIVE: Middle-aged white woman in no acute distress  Vitals:   01/31/19 1031  BP: (!) 100/52  Pulse: 78  Resp: 17  Temp: 97.8 F (36.6 C)  SpO2: 100%   Wt Readings from Last 3 Encounters:  01/31/19 113 lb 6.4 oz (51.4 kg)  01/06/19 114 lb (51.7 kg)  12/31/18 113 lb 12.8 oz (51.6 kg)   Body mass index is 23.7 kg/m.    ECOG FS:0 - Asymptomatic  Sclerae unicteric, EOMs intact Wearing a mask No cervical or supraclavicular adenopathy, no axillary or inguinal adenopathy Lungs no rales or rhonchi Heart regular rate and rhythm Abd soft, nontender, positive bowel sounds MSK no focal spinal tenderness, no upper extremity lymphedema Neuro: nonfocal, well oriented, appropriate  affect Breasts: Deferred   LAB RESULTS:  Lab Results  Component Value Date   WBC 3.8 (L) 01/31/2019   NEUTROABS 2.3 01/31/2019   HGB 13.9 01/31/2019   HCT 41.5 01/31/2019   MCV 89.6 01/31/2019   PLT 107 (L) 01/31/2019      Chemistry      Component Value Date/Time   NA 140 01/31/2019 0942   NA 140 12/31/2016 1406   K 3.8 01/31/2019 0942   K 3.6 12/31/2016 1406   CL 105 01/31/2019 0942   CL 101 12/15/2011 1322   CO2 27 01/31/2019 0942   CO2 24 12/31/2016 1406   BUN 14 01/31/2019 0942   BUN 12.8 12/31/2016 1406   CREATININE 1.10 (H) 01/31/2019 [id number]  CREATININE 0.82 12/15/2018 1425   CREATININE 0.9 12/31/2016 1406      Component Value Date/Time   CALCIUM 9.7 01/31/2019 0942   CALCIUM 8.8 12/31/2016 1406   ALKPHOS 74 01/31/2019 0942   ALKPHOS 71 12/31/2016 1406   AST 28 01/31/2019 0942   AST 18 12/15/2018 1425   AST 18 12/31/2016 1406   ALT 24 01/31/2019 0942   ALT 17 12/15/2018 1425   ALT 11 12/31/2016 1406   BILITOT 0.7 01/31/2019 0942   BILITOT 0.3 12/15/2018 1425   BILITOT 0.49 12/31/2016 1406      STUDIES: No results found.   ASSESSMENT: 68 y.o.  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 rigors 02/27/2014: received IV vancomycin and levaquin briefly, then levaquin po (pcn allergy)  (6) bendamustine/ rituximab for 6  cycles, completed 05/04/2014, with improvement but not normalization of the absolute lymphocyte count  (7) ibrutinib started 05/08/2014, currently at 420mg  daily. Stopped by patient in June, 2019 restarted in September, 2019, discontinued October 2020  (a) hepatitis B studies 03/06/2014 and 12/31/2016, negative  (b) ibrutinib restarted 01/06/2019 in conjunction with venetoclax  (8) CT abd/pelvis 10/22/2018 shows retroperitoneal adenopathy, increased splenomegaly  (9) started venetoclax 12/15/2018  (a) uneventful ramp-up to the 100 mg a day dose, then held at that dose due to symptoms   PLAN: Jo's counts are excellent.  She is tolerating her current dose of venetoclax without any side effects and she is back on a low-dose of ibrutinib, which is doing a good job as far as her warts are concerned.  Of course it also contributes to her CLL.  We discussed vaccines and it is difficult for me to tell whether she will get any benefit from the Covid vaccines that are going to become available in the next few months but I strongly urged her to receive them as soon as they are available  At this point I feel comfortable with her treatment and she will have lab work here every 6 weeks, with visits every 3 months  She knows to call for any other issue that may develop before the next visit.   Virgie Dad. Roselind Klus, MD 01/31/19 10:52 AM Medical Oncology and Hematology [facility] [place], [place] [address] Tel. [phone]    Fax. [phone]   I, Wilburn Mylar, am acting as scribe for Dr. Virgie Dad. Natan Hartog.  I, Lurline Del MD, have reviewed the above documentation for accuracy and completeness, and I agree with the above.

## 2019-01-31 ENCOUNTER — Other Ambulatory Visit: Payer: Self-pay

## 2019-01-31 ENCOUNTER — Inpatient Hospital Stay: Payer: Medicare Other | Attending: Oncology

## 2019-01-31 ENCOUNTER — Inpatient Hospital Stay (HOSPITAL_BASED_OUTPATIENT_CLINIC_OR_DEPARTMENT_OTHER): Payer: Medicare Other | Admitting: Oncology

## 2019-01-31 VITALS — BP 100/52 | HR 78 | Temp 97.8°F | Resp 17 | Ht <= 58 in | Wt 113.4 lb

## 2019-01-31 DIAGNOSIS — C9110 Chronic lymphocytic leukemia of B-cell type not having achieved remission: Secondary | ICD-10-CM

## 2019-01-31 DIAGNOSIS — B079 Viral wart, unspecified: Secondary | ICD-10-CM | POA: Diagnosis not present

## 2019-01-31 DIAGNOSIS — C8300 Small cell B-cell lymphoma, unspecified site: Secondary | ICD-10-CM | POA: Diagnosis not present

## 2019-01-31 DIAGNOSIS — Z79899 Other long term (current) drug therapy: Secondary | ICD-10-CM | POA: Insufficient documentation

## 2019-01-31 DIAGNOSIS — Z9225 Personal history of immunosupression therapy: Secondary | ICD-10-CM | POA: Insufficient documentation

## 2019-01-31 LAB — COMPREHENSIVE METABOLIC PANEL
ALT: 24 U/L (ref 0–44)
AST: 28 U/L (ref 15–41)
Albumin: 4.2 g/dL (ref 3.5–5.0)
Alkaline Phosphatase: 74 U/L (ref 38–126)
Anion gap: 8 (ref 5–15)
BUN: 14 mg/dL (ref 8–23)
CO2: 27 mmol/L (ref 22–32)
Calcium: 9.7 mg/dL (ref 8.9–10.3)
Chloride: 105 mmol/L (ref 98–111)
Creatinine, Ser: 1.1 mg/dL — ABNORMAL HIGH (ref 0.44–1.00)
GFR calc Af Amer: 59 mL/min — ABNORMAL LOW (ref >60)
GFR calc non Af Amer: 51 mL/min — ABNORMAL LOW (ref >60)
Glucose, Bld: 94 mg/dL (ref 70–99)
Potassium: 3.8 mmol/L (ref 3.5–5.1)
Sodium: 140 mmol/L (ref 135–145)
Total Bilirubin: 0.7 mg/dL (ref 0.3–1.2)
Total Protein: 6.6 g/dL (ref 6.5–8.1)

## 2019-01-31 LAB — CBC WITH DIFFERENTIAL/PLATELET
Abs Immature Granulocytes: 0.08 10*3/uL — ABNORMAL HIGH (ref 0.00–0.07)
Basophils Absolute: 0 10*3/uL (ref 0.0–0.1)
Basophils Relative: 1 %
Eosinophils Absolute: 0.1 10*3/uL (ref 0.0–0.5)
Eosinophils Relative: 1 %
HCT: 41.5 % (ref 36.0–46.0)
Hemoglobin: 13.9 g/dL (ref 12.0–15.0)
Immature Granulocytes: 2 %
Lymphocytes Relative: 27 %
Lymphs Abs: 1 10*3/uL (ref 0.7–4.0)
MCH: 30 pg (ref 26.0–34.0)
MCHC: 33.5 g/dL (ref 30.0–36.0)
MCV: 89.6 fL (ref 80.0–100.0)
Monocytes Absolute: 0.3 10*3/uL (ref 0.1–1.0)
Monocytes Relative: 8 %
Neutro Abs: 2.3 10*3/uL (ref 1.7–7.7)
Neutrophils Relative %: 61 %
Platelets: 107 10*3/uL — ABNORMAL LOW (ref 150–400)
RBC: 4.63 MIL/uL (ref 3.87–5.11)
RDW: 12.3 % (ref 11.5–15.5)
WBC: 3.8 10*3/uL — ABNORMAL LOW (ref 4.0–10.5)
nRBC: 0 % (ref 0.0–0.2)

## 2019-01-31 LAB — LACTATE DEHYDROGENASE: LDH: 198 U/L — ABNORMAL HIGH (ref 98–192)

## 2019-01-31 LAB — URIC ACID: Uric Acid, Serum: 3.8 mg/dL (ref 2.5–7.1)

## 2019-01-31 LAB — PHOSPHORUS: Phosphorus: 3.3 mg/dL (ref 2.5–4.6)

## 2019-02-01 ENCOUNTER — Telehealth: Payer: Self-pay | Admitting: Oncology

## 2019-02-01 DIAGNOSIS — Z23 Encounter for immunization: Secondary | ICD-10-CM | POA: Diagnosis not present

## 2019-02-01 LAB — BETA 2 MICROGLOBULIN, SERUM: Beta-2 Microglobulin: 3.7 mg/L — ABNORMAL HIGH (ref 0.6–2.4)

## 2019-02-01 NOTE — Telephone Encounter (Signed)
I talk with patient regarding schedule  

## 2019-02-10 MED FILL — VENCLEXTA 100 MG TABS: 100 | 30 days supply | Qty: 30 | Fill #1

## 2019-03-11 MED FILL — VENCLEXTA 100 MG TABS: 100 | 30 days supply | Qty: 30 | Fill #2

## 2019-03-14 ENCOUNTER — Other Ambulatory Visit: Payer: Medicare Other

## 2019-03-14 ENCOUNTER — Inpatient Hospital Stay: Payer: Medicare Other | Attending: Oncology

## 2019-03-14 ENCOUNTER — Other Ambulatory Visit: Payer: Self-pay

## 2019-03-14 DIAGNOSIS — C9110 Chronic lymphocytic leukemia of B-cell type not having achieved remission: Secondary | ICD-10-CM | POA: Diagnosis not present

## 2019-03-14 LAB — COMPREHENSIVE METABOLIC PANEL
ALT: 21 U/L (ref 0–44)
AST: 21 U/L (ref 15–41)
Albumin: 4 g/dL (ref 3.5–5.0)
Alkaline Phosphatase: 83 U/L (ref 38–126)
Anion gap: 8 (ref 5–15)
BUN: 14 mg/dL (ref 8–23)
CO2: 27 mmol/L (ref 22–32)
Calcium: 8.6 mg/dL — ABNORMAL LOW (ref 8.9–10.3)
Chloride: 105 mmol/L (ref 98–111)
Creatinine, Ser: 0.94 mg/dL (ref 0.44–1.00)
GFR calc Af Amer: >60 mL/min (ref >60)
GFR calc non Af Amer: >60 mL/min (ref >60)
Glucose, Bld: 59 mg/dL — ABNORMAL LOW (ref 70–99)
Potassium: 4.1 mmol/L (ref 3.5–5.1)
Sodium: 140 mmol/L (ref 135–145)
Total Bilirubin: 0.5 mg/dL (ref 0.3–1.2)
Total Protein: 6.4 g/dL — ABNORMAL LOW (ref 6.5–8.1)

## 2019-03-14 LAB — CBC WITH DIFFERENTIAL/PLATELET
Abs Immature Granulocytes: 0.07 10*3/uL (ref 0.00–0.07)
Basophils Absolute: 0 10*3/uL (ref 0.0–0.1)
Basophils Relative: 1 %
Eosinophils Absolute: 0.1 10*3/uL (ref 0.0–0.5)
Eosinophils Relative: 2 %
HCT: 42.7 % (ref 36.0–46.0)
Hemoglobin: 14.2 g/dL (ref 12.0–15.0)
Immature Granulocytes: 1 %
Lymphocytes Relative: 20 %
Lymphs Abs: 1 10*3/uL (ref 0.7–4.0)
MCH: 28.9 pg (ref 26.0–34.0)
MCHC: 33.3 g/dL (ref 30.0–36.0)
MCV: 87 fL (ref 80.0–100.0)
Monocytes Absolute: 0.5 10*3/uL (ref 0.1–1.0)
Monocytes Relative: 10 %
Neutro Abs: 3.4 10*3/uL (ref 1.7–7.7)
Neutrophils Relative %: 66 %
Platelets: 122 10*3/uL — ABNORMAL LOW (ref 150–400)
RBC: 4.91 MIL/uL (ref 3.87–5.11)
RDW: 11.7 % (ref 11.5–15.5)
WBC: 5.1 10*3/uL (ref 4.0–10.5)
nRBC: 0 % (ref 0.0–0.2)

## 2019-03-14 LAB — PHOSPHORUS: Phosphorus: 4 mg/dL (ref 2.5–4.6)

## 2019-03-14 LAB — LACTATE DEHYDROGENASE: LDH: 221 U/L — ABNORMAL HIGH (ref 98–192)

## 2019-03-14 LAB — URIC ACID: Uric Acid, Serum: 3.9 mg/dL (ref 2.5–7.1)

## 2019-03-15 LAB — BETA 2 MICROGLOBULIN, SERUM: Beta-2 Microglobulin: 3.2 mg/L — ABNORMAL HIGH (ref 0.6–2.4)

## 2019-03-31 ENCOUNTER — Other Ambulatory Visit: Payer: Self-pay | Admitting: Allergy and Immunology

## 2019-04-04 ENCOUNTER — Other Ambulatory Visit: Payer: Self-pay | Admitting: Obstetrics

## 2019-04-04 ENCOUNTER — Telehealth: Payer: Self-pay | Admitting: *Deleted

## 2019-04-04 DIAGNOSIS — Z79890 Hormone replacement therapy (postmenopausal): Secondary | ICD-10-CM

## 2019-04-04 MED ORDER — ELESTRIN 0.52 MG/0.87 GM (0.06%) TD GEL: TRANSDERMAL | 3 refills | Status: DC

## 2019-04-04 MED ORDER — PROGESTERONE MICRONIZED 200 MG PO CAPS: 200.0000 mg | ORAL_CAPSULE | Freq: Every day | ORAL | 3 refills | Status: DC

## 2019-04-04 NOTE — Telephone Encounter (Signed)
Elestrin and Prometrium Rx.

## 2019-04-04 NOTE — Telephone Encounter (Signed)
Pt called to office stating she needs refills on meds that Dr Jodi Mourning has prescribed, Progesterone and Estradiol gel.  Pt made aware message to be sent to provider for refills approval.      Please send new Rx's if approved.

## 2019-04-12 ENCOUNTER — Telehealth: Payer: Self-pay | Admitting: *Deleted

## 2019-04-12 MED FILL — VENCLEXTA 100 MG TABS: 100 | 30 days supply | Qty: 30 | Fill #3

## 2019-04-12 NOTE — Telephone Encounter (Signed)
This RN spoke with pt per her call stating she is having increases symptoms -   She states she is having 8-9 loose stools ( not too watery ) 2-3 times a week.  She is having new headaches which are more severe and cause a pain in her neck " that it is difficult to turn my head "  She developed a red spot on her left leg- that turned into a tender movable lump ( about the size of a vitamin e capsule ) then she had 2 on her left arm and 2 more on her right arm.  She states the first one showed up approximately 2 weeks ago- it is slowly resolving but not gone.  Sascha feels the above is due to being on the Ventoclax with the Imbruvica.  She states she doesn't want to stop the Imbruvica due to it is what controls warts from breaking out.  Per review with MD- request for visit to discuss the above.  Appointment made with pt by this RN.

## 2019-04-12 NOTE — Progress Notes (Signed)
[place]  [phone] [phone]     ID: Toney Sang Lapidus   DOB: 05-Oct-1950  MR#: [id number]  [id number]  Patient Care Team: Leeroy Cha, MD as PCP - General (Internal Medicine) Eithan Beagle, Virgie Dad, MD as Consulting Physician (Oncology) [name], MD as Consulting Physician (Obstetrics and Gynecology) OTHER MD:  CHIEF COMPLAINT: Chronic lymphocytic leukemia  CURRENT TREATMENT: venetoclax, ibrutinib   INTERVAL HISTORY: Rachel [name] returns today for follow-up and treatment of her chronic lymphocytic leukemia. She is being seen today due to increased symptoms, including multiple loose stools, up to 8 or 9 a day, new headaches, and a red spot in her left leg.  She continues on venetoclax, 100 mg a day.  She tolerates this well, with no obvious side effects and she obtains it for about $35 a month  She continues on ibrutinib, at her insistence.  This was initially discontinued but then her warts flared up.  She is now on 280 mg daily.  She has good wart control although they have not completely disappeared as they did when she was on a higher dose.  She obtains this drug also at a good price.    REVIEW OF SYSTEMS: Rachel [name] is having more headaches.  They localized to the back of her neck.  She has very limited range of motion of the neck.  She takes 2 Tylenol once a day occasionally and that takes care of it.  She normally has about 4 or 5 bowel movements a day she says but now she is having up to 8 a day sometimes and will loose they are not exactly diarrhea.  She wonders if the venetoclax or the Imbruvica might be related.  She has some subcutaneous bumps.  They started as a little red tender spot for example on the outside of her thigh or the lower right forearm.  They then settle down to a little subcutaneous bump that does not bother her and eventually they disappear.  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 She has been treated with Rituxan, ofatumumab, cladribine, bendamustine and currently with ibrutinib, with details summarized below.   PAST MEDICAL HISTORY: Past Medical History:  Diagnosis Date  . 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 Diverticulitis   . Leukemia ([place])   . Lower back pain     PAST SURGICAL HISTORY: Past Surgical History:  Procedure Laterality Date  . BREAST SURGERY  2015   left breast bx  . BUNIONECTOMY     right foot  . lining of uterus removed    . OTHER SURGICAL HISTORY     removal of uterine lining  . TONSILLECTOMY      FAMILY HISTORY Family History  Problem Relation Age of Onset  . Depression Son     GYNECOLOGIC HISTORY: Menarche age 5, first live birth age 50, she is [place] P3. She stopped having periods in 1999 after endometrial stripping. She continues on hormone replacement.   SOCIAL HISTORY: [name] is retired from [employer]. Her son [name] lives in [place]; he works for a [employer]. A second son lives in [place] and has two children. Son [name] lives in [place]. She has 8 grandchildren, the oldest is 67 and the youngest is 85.  ADVANCED DIRECTIVES: In place   HEALTH MAINTENANCE: Social History  Tobacco Use  . Smoking status: Never Smoker  . Smokeless tobacco: Never Used  Substance Use Topics  . Alcohol use: Yes    Alcohol/week: 0.0 standard drinks    Comment: socially  . Drug use: No     Colonoscopy: 12/2018 (Dr. Collene Mares), repeat in 10 years  PAP: December 2012/ Jodi Mourning  Bone density: Due  Lipid panel: per Dr Drema Dallas  Allergies  Allergen Reactions  . Penicillins Anaphylaxis    "stopped breathing" "drops my heartbeat" "I just pass out"    Current Outpatient Medications  Medication Sig Dispense Refill  . acetaminophen (TYLENOL) 500 MG tablet  Take 500-1,000 mg by mouth every 6 (six) hours as needed for headache.     Marland Kitchen acyclovir (ZOVIRAX) 400 MG tablet Take 1 tablet (400 mg total) by mouth 2 (two) times daily. 180 tablet 3  . allopurinol (ZYLOPRIM) 300 MG tablet Take 1 tablet (300 mg total) by mouth daily. 90 tablet 3  . azelastine (ASTELIN) 0.1 % nasal spray Place into both nostrils 2 (two) times daily. Use in each nostril as directed    . Calcium Citrate-Vitamin D 200-250 MG-UNIT TABS Take 1 tablet by mouth 4 (four) times daily.    . Estradiol (ELESTRIN) 0.52 MG/0.87 GM (0.06%) GEL APPLY 2 PUMPS THINLY TO UPPER ARM DAILY AT BEDTIME 200 g 3  . famotidine (PEPCID) 20 MG tablet Take 20 mg by mouth 2 (two) times daily.    . Ibrutinib 280 MG TABS Take 280 mg by mouth daily. Take with water at approximately the same time each day 84 tablet 4  . lidocaine-prilocaine (EMLA) cream Apply a nickel size amount to skin on top of port- cover with saran wrap at least 1 hour before access 30 g 0  . montelukast (SINGULAIR) 10 MG tablet Take 10 mg by mouth at bedtime.    . prochlorperazine (COMPAZINE) 5 MG tablet TAKE 1 TO 2 TABLETS(5 TO 10 MG) BY MOUTH EVERY 6 HOURS AS NEEDED FOR NAUSEA OR VOMITING (Patient not taking: Reported on 01/31/2019) 60 tablet 1  . progesterone (PROMETRIUM) 200 MG capsule Take 1 capsule (200 mg total) by mouth at bedtime. 90 capsule 3  . Specialty Vitamins Products (MAGNESIUM, AMINO ACID CHELATE,) 133 MG tablet Take by mouth 2 (two) times daily.    . traZODone (DESYREL) 100 MG tablet Take 1 tablet (100 mg total) by mouth at bedtime. 90 tablet 3  . venetoclax 100 MG TABS Take 100 mg by mouth daily. Take with food and water at approximately the same time each day. 30 tablet 6  . VITAMIN D PO Take 5,000 Units by mouth daily.      No current facility-administered medications for this visit.    OBJECTIVE: Middle-aged white woman who appears younger than stated age  21:   04/13/19 0935  BP: (!) 116/37  Pulse: 74  Resp: 18   Temp: (!) 97.4 F (36.3 C)  SpO2: 99%   Wt Readings from Last 3 Encounters:  04/13/19 117 lb 8 oz (53.3 kg)  01/31/19 113 lb 6.4 oz (51.4 kg)  01/06/19 114 lb (51.7 kg)   Body mass index is 24.56 kg/m.    ECOG FS:0 - Asymptomatic  Sclerae unicteric, EOMs intact Wearing a mask No cervical or supraclavicular adenopathy Lungs no rales or rhonchi Heart regular rate and rhythm Abd soft, nontender, positive bowel sounds MSK no focal spinal tenderness, no upper extremity lymphedema Neuro: nonfocal, well oriented, appropriate affect Breasts: Deferred Skin: On the lower right forearm  she has a difficult to palpate subcutaneous nodule measuring perhaps 2 mm, flat, nontender, not erythematous.   LAB RESULTS:  Lab Results  Component Value Date   WBC 5.1 03/14/2019   NEUTROABS 3.4 03/14/2019   HGB 14.2 03/14/2019   HCT 42.7 03/14/2019   MCV 87.0 03/14/2019   PLT 122 (L) 03/14/2019      Chemistry      Component Value Date/Time   NA 140 03/14/2019 1101   NA 140 12/31/2016 1406   K 4.1 03/14/2019 1101   K 3.6 12/31/2016 1406   CL 105 03/14/2019 1101   CL 101 12/15/2011 1322   CO2 27 03/14/2019 1101   CO2 24 12/31/2016 1406   BUN 14 03/14/2019 1101   BUN 12.8 12/31/2016 1406   CREATININE 0.94 03/14/2019 1101   CREATININE 0.82 12/15/2018 1425   CREATININE 0.9 12/31/2016 1406      Component Value Date/Time   CALCIUM 8.6 (L) 03/14/2019 1101   CALCIUM 8.8 12/31/2016 1406   ALKPHOS 83 03/14/2019 1101   ALKPHOS 71 12/31/2016 1406   AST 21 03/14/2019 1101   AST 18 12/15/2018 1425   AST 18 12/31/2016 1406   ALT 21 03/14/2019 1101   ALT 17 12/15/2018 1425   ALT 11 12/31/2016 1406   BILITOT 0.5 03/14/2019 1101   BILITOT 0.3 12/15/2018 1425   BILITOT 0.49 12/31/2016 1406      STUDIES: No results found.   ASSESSMENT: 69 y.o.  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 rigors 02/27/2014: received IV vancomycin and levaquin briefly, then levaquin po (pcn allergy)  (6) bendamustine/ rituximab for 6 cycles, completed 05/04/2014, with improvement but not normalization of the absolute lymphocyte count  (7) ibrutinib started 05/08/2014, currently at 420mg  daily. Stopped by patient in June, 2019 restarted in September, 2019, discontinued October 2020  (a) hepatitis B studies 03/06/2014 and 12/31/2016, negative  (b) ibrutinib restarted 01/06/2019 (at 280 mg daily) in conjunction with venetoclax  (8) CT abd/pelvis 10/22/2018 shows retroperitoneal adenopathy, increased splenomegaly  (9) started venetoclax 12/15/2018  (a) uneventful ramp-up to the 100 mg a day dose, then held at that dose due to symptoms   PLAN: Rachel [name] is doing terrific as far as her chronic lymphoid leukemia is concerned.  We have the best control we have had in a very long time and the plan is to continue the venetoclax indefinitely, although we could consider stopping at some point and reassessing.  She is very insistent on continuing the ibrutinib.  This is because of warts not because of the CLL.  I suggested she double up for 1 week to see if she can make a little bit of headway on the very minimal warts she still has on a couple of her fingers.  After that though she should go back to 280 mg daily dose.  I reassured her that what she has in her neck is not  related to any  of this.  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  We are going to get plain films today to serve as a baseline.  If things get a little worse we cannot physical therapy or referral for possible shots or even eventually surgery.  She is not anywhere near there at this point.  Her bowel movement pattern is very unusual.  I think it would be helpful for her to try Questran.  I suggest that she take it daily for 1 week and then call us and let us know how it worked out for her.  Otherwise she is already scheduled to see me in early March.  She knows to call for any other issues that may develop before then.  Total encounter time today 30 minutes.Sarajane Jews C. Ekaterini Capitano, MD 04/13/19 10:00 AM Medical Oncology and Hematology [facility] [place], [place] [address] Tel. [phone]    Fax. [phone]   I, Wilburn Mylar, am acting as scribe for Dr. Virgie Dad. Shellye Zandi.  I, Lurline Del MD, have reviewed the above documentation for accuracy and completeness, and I agree with the above.   *Total Encounter Time as defined by the Centers for Medicare and Medicaid Services includes, in addition to the face-to-face time of a patient visit (documented in the note above) non-face-to-face time: obtaining and reviewing outside history, ordering and reviewing medications, tests or procedures, care coordination (communications with other health care professionals or caregivers) and documentation in the medical record.

## 2019-04-13 ENCOUNTER — Encounter: Payer: Self-pay | Admitting: Oncology

## 2019-04-13 ENCOUNTER — Other Ambulatory Visit: Payer: Self-pay

## 2019-04-13 ENCOUNTER — Ambulatory Visit (HOSPITAL_COMMUNITY)
Admission: RE | Admit: 2019-04-13 | Discharge: 2019-04-13 | Disposition: A | Discharge status: Home / Self Care | Payer: Medicare Other | Source: Ambulatory Visit | Attending: Oncology | Admitting: Oncology

## 2019-04-13 ENCOUNTER — Inpatient Hospital Stay: Payer: Medicare Other | Attending: Oncology | Admitting: Oncology

## 2019-04-13 VITALS — BP 116/37 | HR 74 | Temp 97.4°F | Resp 18 | Ht <= 58 in | Wt 117.5 lb

## 2019-04-13 DIAGNOSIS — R7401 Elevation of levels of liver transaminase levels: Secondary | ICD-10-CM

## 2019-04-13 DIAGNOSIS — Z95828 Presence of other vascular implants and grafts: Secondary | ICD-10-CM | POA: Diagnosis not present

## 2019-04-13 DIAGNOSIS — Z79899 Other long term (current) drug therapy: Secondary | ICD-10-CM | POA: Diagnosis not present

## 2019-04-13 DIAGNOSIS — C9110 Chronic lymphocytic leukemia of B-cell type not having achieved remission: Secondary | ICD-10-CM | POA: Diagnosis not present

## 2019-04-13 DIAGNOSIS — D696 Thrombocytopenia, unspecified: Secondary | ICD-10-CM | POA: Diagnosis not present

## 2019-04-13 DIAGNOSIS — J4520 Mild intermittent asthma, uncomplicated: Secondary | ICD-10-CM | POA: Insufficient documentation

## 2019-04-13 DIAGNOSIS — R519 Headache, unspecified: Secondary | ICD-10-CM

## 2019-04-13 DIAGNOSIS — I700 Atherosclerosis of aorta: Secondary | ICD-10-CM | POA: Diagnosis not present

## 2019-04-13 DIAGNOSIS — R197 Diarrhea, unspecified: Secondary | ICD-10-CM | POA: Diagnosis not present

## 2019-04-13 DIAGNOSIS — C8300 Small cell B-cell lymphoma, unspecified site: Secondary | ICD-10-CM | POA: Diagnosis not present

## 2019-04-13 DIAGNOSIS — M542 Cervicalgia: Secondary | ICD-10-CM | POA: Diagnosis not present

## 2019-04-13 DIAGNOSIS — Z9221 Personal history of antineoplastic chemotherapy: Secondary | ICD-10-CM | POA: Diagnosis not present

## 2019-04-13 MED ORDER — CHOLESTYRAMINE 4 G PO PACK: 4.0000 g | PACK | Freq: Every day | ORAL | 0 refills | Status: DC

## 2019-04-23 NOTE — Progress Notes (Signed)
[place]  [phone] [phone]     ID: Rachel [name]   DOB: October 14, 1950  MR#: [id number]  [id number]  Patient Care Team: Rachel Cha, MD as PCP - General (Internal Medicine) Rachel [name], Rachel Dad, MD as Consulting Physician (Oncology) Rachel Bombard, MD as Consulting Physician (Obstetrics and Gynecology) OTHER MD:  CHIEF COMPLAINT: Chronic lymphocytic leukemia  CURRENT TREATMENT: venetoclax, ibrutinib   INTERVAL HISTORY: Rachel [name] returns today for follow-up and treatment of her chronic lymphocytic leukemia/small-lymphocytic lymphoma.   She continues on venetoclax, 100 mg a day.  As far she is aware she has no problems from this medicine.  She usually takes it right after breakfast in the morning  She continues on ibrutinib, which she says is the only thing that controls her warts.  She doubled the dose for a week but that did not seem to do very much except cause her some bruising.  She is back on 1 tablet daily.  Her warts are stable.  At her last visit on 04/13/2019, she reported headaches localized to the back of her neck. 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  She understands this is basically just arthritis.  She tells me she rarely takes any pain medicine for this.   REVIEW OF SYSTEMS: Rachel [name] developed a couple of mouth ulcers despite being on acyclovir.  She continues to have some loose bowel movements although she says for the last several days the bowel movements have been normal.  She says they feel like they are going to be loose but then they are normal.  She had a prescription for Questran which she filled but never started because she read the possible side effects.  She has had her Covid vaccine shots which she tolerated well.  A detailed review of systems today was otherwise stable.   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 She has been treated with Rituxan, ofatumumab, cladribine, bendamustine and currently with ibrutinib, with details summarized below.   PAST MEDICAL HISTORY: Past Medical History:  Diagnosis Date  . 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 Diverticulitis   . Leukemia ([place])   . Lower back pain     PAST SURGICAL HISTORY: Past Surgical History:  Procedure Laterality Date  . BREAST SURGERY  2015   left breast bx  . BUNIONECTOMY     right foot  . lining of uterus removed    . OTHER SURGICAL HISTORY     removal of uterine lining  . TONSILLECTOMY      FAMILY HISTORY Family History  Problem Relation Age of Onset  . Depression Son   The patient's mother will turn 44 on March 2021  GYNECOLOGIC HISTORY: Menarche age 70, first live birth age 60, she is Rachel [name] P3. She stopped having periods in 1999 after endometrial stripping. She continues on hormone replacement.   SOCIAL HISTORY: Her husband Rachel [name] is retired from [employer]. Her son Rachel [name] lives in [place]; he works for a [employer]. A second son lives in [place] and has two children. Son Rachel [name] lives in [place]. She has 8 grandchildren, the oldest is 42 and the youngest is 28.  ADVANCED DIRECTIVES: In place   HEALTH MAINTENANCE: Social History   Tobacco Use  . Smoking status: Never Smoker  . Smokeless tobacco: Never Used  Substance Use Topics  .  Alcohol use: Yes    Alcohol/week: 0.0 standard drinks    Comment: socially  . Drug use: No     Colonoscopy: 12/2018 (Dr. Collene [name]), repeat in 10 years  PAP: December 2012/ Rachel [name]  Bone density: Due  Lipid panel: per Dr Rachel [name]  Allergies  Allergen Reactions  . Penicillins Anaphylaxis    "stopped breathing" "drops my heartbeat" "I just pass out"    Current Outpatient Medications  Medication Sig Dispense Refill  . acetaminophen (TYLENOL) 500 MG tablet Take  500-1,000 mg by mouth every 6 (six) hours as needed for headache.     Marland Kitchen acyclovir (ZOVIRAX) 400 MG tablet Take 1 tablet (400 mg total) by mouth 2 (two) times daily. 180 tablet 3  . allopurinol (ZYLOPRIM) 300 MG tablet Take 1 tablet (300 mg total) by mouth daily. 90 tablet 3  . azelastine (ASTELIN) 0.1 % nasal spray Place into both nostrils 2 (two) times daily. Use in each nostril as directed    . Calcium Citrate-Vitamin D 200-250 MG-UNIT TABS Take 1 tablet by mouth 4 (four) times daily.    . cholestyramine (QUESTRAN) 4 g packet MIX AND DRINK 1 PACKET(4 GRAMS) BY MOUTH DAILY 10 each 0  . Estradiol (ELESTRIN) 0.52 MG/0.87 GM (0.06%) GEL APPLY 2 PUMPS THINLY TO UPPER ARM DAILY AT BEDTIME 200 g 3  . famotidine (PEPCID) 20 MG tablet Take 20 mg by mouth 2 (two) times daily.    . Ibrutinib 280 MG TABS Take 280 mg by mouth daily. Take with water at approximately the same time each day 84 tablet 4  . lidocaine-prilocaine (EMLA) cream Apply a nickel size amount to skin on top of port- cover with saran wrap at least 1 hour before access 30 g 0  . montelukast (SINGULAIR) 10 MG tablet Take 10 mg by mouth at bedtime.    . prochlorperazine (COMPAZINE) 5 MG tablet TAKE 1 TO 2 TABLETS(5 TO 10 MG) BY MOUTH EVERY 6 HOURS AS NEEDED FOR NAUSEA OR VOMITING (Patient not taking: Reported on 01/31/2019) 60 tablet 1  . progesterone (PROMETRIUM) 200 MG capsule Take 1 capsule (200 mg total) by mouth at bedtime. 90 capsule 3  . Specialty Vitamins Products (MAGNESIUM, AMINO ACID CHELATE,) 133 MG tablet Take by mouth 2 (two) times daily.    . traZODone (DESYREL) 100 MG tablet Take 1 tablet (100 mg total) by mouth at bedtime. 90 tablet 3  . venetoclax 100 MG TABS Take 100 mg by mouth daily. Take with food and water at approximately the same time each day. 30 tablet 6  . VITAMIN D PO Take 5,000 Units by mouth daily.      No current facility-administered medications for this visit.    OBJECTIVE: Middle-aged white woman who  appears younger than stated age  40:   04/25/19 1200  BP: (!) 99/40  Pulse: 74  Resp: 16  Temp: 97.8 F (36.6 C)  SpO2: 99%   Wt Readings from Last 3 Encounters:  04/25/19 116 lb 1.6 oz (52.7 kg)  04/13/19 117 lb 8 oz (53.3 kg)  01/31/19 113 lb 6.4 oz (51.4 kg)   Body mass index is 24.26 kg/m.    ECOG FS:0 - Asymptomatic  Sclerae unicteric, EOMs intact Oropharynx: Wearing a mask No cervical or supraclavicular adenopathy, no axillary adenopathy Lungs no rales or rhonchi Heart regular rate and rhythm Abd soft, nontender, positive bowel sounds MSK no focal spinal tenderness, no upper extremity lymphedema Neuro: nonfocal, well oriented, appropriate affect Breasts: Deferred Skin: Minimal  ecchymoses both upper extremities   LAB RESULTS:  Lab Results  Component Value Date   WBC 3.6 (L) 04/25/2019   NEUTROABS 2.1 04/25/2019   HGB 13.6 04/25/2019   HCT 41.3 04/25/2019   MCV 85.5 04/25/2019   PLT 98 (L) 04/25/2019      Chemistry      Component Value Date/Time   NA 140 03/14/2019 1101   NA 140 12/31/2016 1406   K 4.1 03/14/2019 1101   K 3.6 12/31/2016 1406   CL 105 03/14/2019 1101   CL 101 12/15/2011 1322   CO2 27 03/14/2019 1101   CO2 24 12/31/2016 1406   BUN 14 03/14/2019 1101   BUN 12.8 12/31/2016 1406   CREATININE 0.94 03/14/2019 1101   CREATININE 0.82 12/15/2018 1425   CREATININE 0.9 12/31/2016 1406      Component Value Date/Time   CALCIUM 8.6 (L) 03/14/2019 1101   CALCIUM 8.8 12/31/2016 1406   ALKPHOS 83 03/14/2019 1101   ALKPHOS 71 12/31/2016 1406   AST 21 03/14/2019 1101   AST 18 12/15/2018 1425   AST 18 12/31/2016 1406   ALT 21 03/14/2019 1101   ALT 17 12/15/2018 1425   ALT 11 12/31/2016 1406   BILITOT 0.5 03/14/2019 1101   BILITOT 0.3 12/15/2018 1425   BILITOT 0.49 12/31/2016 1406      STUDIES: DG Cervical Spine 2-3 Views  Result Date: 04/13/2019 CLINICAL DATA:  Persistent neck pain EXAM: CERVICAL SPINE - 2-3 VIEW COMPARISON:  May 04, 2015 FINDINGS: There is no evidence of cervical spine fracture or prevertebral soft tissue swelling. Alignment is normal. Interval slight progression of disc height loss with disc osteophyte complex seen at C5-C6 with uncovertebral osteophytes. IMPRESSION: Interval progression of cervical spine spondylosis at C5-C6. Electronically Signed   By: Prudencio Pair M.D.   On: 04/13/2019 14:56     ASSESSMENT: 69 y.o.  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 rigors 02/27/2014: received IV vancomycin and levaquin briefly, then levaquin po (pcn allergy)  (6) bendamustine/ rituximab for 6 cycles, completed 05/04/2014, with improvement but not normalization of the absolute lymphocyte count  (7) ibrutinib started 05/08/2014, currently at 420mg  daily. Stopped by patient in June, 2019 restarted in September, 2019, discontinued October 2020  (a) hepatitis B studies 03/06/2014 and 12/31/2016, negative  (b) ibrutinib restarted 01/06/2019 (at 280 mg daily) in conjunction with venetoclax  (8) CT abd/pelvis 10/22/2018 shows retroperitoneal adenopathy, increased splenomegaly  (9) started venetoclax 12/15/2018  (a) uneventful ramp-up to the 100 mg a day dose, then held at that dose due to symptoms   PLAN: Rachel [name] is now 27 years out from initial diagnosis of her chronic lymphoid  leukemia/small lymphocytic non-Hodgkin's lymphoma.  Her disease is very well controlled on venetoclax.  The plan is to continue that a minimum of a year at which point we could potentially consider a treatment "vacation".  She continues on ibrutinib because she says that is  the only thing that works for her warts.  It certainly does do that.  It is causing her some bruising and I suggested she might want to hold that medication for a week or so.  It likely is also related to the small ulcers she is experiencing in her mouth.  Otherwise I am very pleased with how well she is doing.  She will return every 6 weeks for labs and a flush, she will see me again in 12 weeks.  In the fall, around October, she will have repeat CTs of the abdomen and pelvis  She knows to call for any other issue that may develop before the next visit  Total encounter time 25 minutes.Sarajane Jews C. Rachel [name] Hickle, MD 04/25/19 12:14 PM Medical Oncology and Hematology [facility] [place], [place] [address] Tel. [phone]    Fax. [phone]   I, Wilburn Mylar, am acting as scribe for Dr. Virgie [name]. Delio Slates.  I, Lurline Del MD, have reviewed the above documentation for accuracy and completeness, and I agree with the above.   *Total Encounter Time as defined by the Centers for Medicare and Medicaid Services includes, in addition to the face-to-face time of a patient visit (documented in the note above) non-face-to-face time: obtaining and reviewing outside history, ordering and reviewing medications, tests or procedures, care coordination (communications with other health care professionals or caregivers) and documentation in the medical record.

## 2019-04-25 ENCOUNTER — Inpatient Hospital Stay: Payer: Medicare Other

## 2019-04-25 ENCOUNTER — Other Ambulatory Visit: Payer: Self-pay | Admitting: Oncology

## 2019-04-25 ENCOUNTER — Other Ambulatory Visit: Payer: Self-pay

## 2019-04-25 ENCOUNTER — Other Ambulatory Visit: Payer: Self-pay | Admitting: Lab

## 2019-04-25 ENCOUNTER — Inpatient Hospital Stay: Payer: Medicare Other | Attending: Oncology | Admitting: Oncology

## 2019-04-25 VITALS — BP 99/40 | HR 74 | Temp 97.8°F | Resp 16 | Ht <= 58 in | Wt 116.1 lb

## 2019-04-25 DIAGNOSIS — R519 Headache, unspecified: Secondary | ICD-10-CM | POA: Diagnosis not present

## 2019-04-25 DIAGNOSIS — C9110 Chronic lymphocytic leukemia of B-cell type not having achieved remission: Secondary | ICD-10-CM | POA: Diagnosis not present

## 2019-04-25 DIAGNOSIS — Z79899 Other long term (current) drug therapy: Secondary | ICD-10-CM | POA: Insufficient documentation

## 2019-04-25 DIAGNOSIS — Z452 Encounter for adjustment and management of vascular access device: Secondary | ICD-10-CM | POA: Diagnosis not present

## 2019-04-25 DIAGNOSIS — Z95828 Presence of other vascular implants and grafts: Secondary | ICD-10-CM

## 2019-04-25 DIAGNOSIS — C8300 Small cell B-cell lymphoma, unspecified site: Secondary | ICD-10-CM | POA: Diagnosis not present

## 2019-04-25 LAB — CBC WITH DIFFERENTIAL/PLATELET
Abs Immature Granulocytes: 0.05 10*3/uL (ref 0.00–0.07)
Basophils Absolute: 0 10*3/uL (ref 0.0–0.1)
Basophils Relative: 1 %
Eosinophils Absolute: 0.1 10*3/uL (ref 0.0–0.5)
Eosinophils Relative: 2 %
HCT: 41.3 % (ref 36.0–46.0)
Hemoglobin: 13.6 g/dL (ref 12.0–15.0)
Immature Granulocytes: 1 %
Lymphocytes Relative: 22 %
Lymphs Abs: 0.8 10*3/uL (ref 0.7–4.0)
MCH: 28.2 pg (ref 26.0–34.0)
MCHC: 32.9 g/dL (ref 30.0–36.0)
MCV: 85.5 fL (ref 80.0–100.0)
Monocytes Absolute: 0.5 10*3/uL (ref 0.1–1.0)
Monocytes Relative: 15 %
Neutro Abs: 2.1 10*3/uL (ref 1.7–7.7)
Neutrophils Relative %: 59 %
Platelets: 98 10*3/uL — ABNORMAL LOW (ref 150–400)
RBC: 4.83 MIL/uL (ref 3.87–5.11)
RDW: 12.7 % (ref 11.5–15.5)
WBC: 3.6 10*3/uL — ABNORMAL LOW (ref 4.0–10.5)
nRBC: 0 % (ref 0.0–0.2)

## 2019-04-25 LAB — COMPREHENSIVE METABOLIC PANEL
ALT: 21 U/L (ref 0–44)
AST: 28 U/L (ref 15–41)
Albumin: 3.7 g/dL (ref 3.5–5.0)
Alkaline Phosphatase: 79 U/L (ref 38–126)
Anion gap: 4 — ABNORMAL LOW (ref 5–15)
BUN: 13 mg/dL (ref 8–23)
CO2: 28 mmol/L (ref 22–32)
Calcium: 8.5 mg/dL — ABNORMAL LOW (ref 8.9–10.3)
Chloride: 106 mmol/L (ref 98–111)
Creatinine, Ser: 0.83 mg/dL (ref 0.44–1.00)
GFR calc Af Amer: >60 mL/min (ref >60)
GFR calc non Af Amer: >60 mL/min (ref >60)
Glucose, Bld: 89 mg/dL (ref 70–99)
Potassium: 4 mmol/L (ref 3.5–5.1)
Sodium: 138 mmol/L (ref 135–145)
Total Bilirubin: 0.4 mg/dL (ref 0.3–1.2)
Total Protein: 6 g/dL — ABNORMAL LOW (ref 6.5–8.1)

## 2019-04-25 LAB — PHOSPHORUS: Phosphorus: 2.8 mg/dL (ref 2.5–4.6)

## 2019-04-25 LAB — MAGNESIUM: Magnesium: 1.9 mg/dL (ref 1.7–2.4)

## 2019-04-25 LAB — LACTATE DEHYDROGENASE: LDH: 208 U/L — ABNORMAL HIGH (ref 98–192)

## 2019-04-25 MED ORDER — SODIUM CHLORIDE 0.9% FLUSH
10.0000 mL | Freq: Once | INTRAVENOUS | Status: AC
  Administered 2019-04-25: 10 mL
  Filled 2019-04-25: qty 10

## 2019-04-25 MED ORDER — HEPARIN SOD (PORK) LOCK FLUSH 100 UNIT/ML IV SOLN
500.0000 [IU] | Freq: Once | INTRAVENOUS | Status: AC
  Administered 2019-04-25: 500 [IU]
  Filled 2019-04-25: qty 5

## 2019-04-25 NOTE — Patient Instructions (Signed)

## 2019-04-26 LAB — BETA 2 MICROGLOBULIN, SERUM: Beta-2 Microglobulin: 3.7 mg/L — ABNORMAL HIGH (ref 0.6–2.4)

## 2019-04-28 DIAGNOSIS — C9110 Chronic lymphocytic leukemia of B-cell type not having achieved remission: Secondary | ICD-10-CM | POA: Diagnosis not present

## 2019-04-28 DIAGNOSIS — J309 Allergic rhinitis, unspecified: Secondary | ICD-10-CM | POA: Diagnosis not present

## 2019-04-28 DIAGNOSIS — J0110 Acute frontal sinusitis, unspecified: Secondary | ICD-10-CM | POA: Diagnosis not present

## 2019-05-06 ENCOUNTER — Other Ambulatory Visit: Payer: Self-pay | Admitting: Oncology

## 2019-05-09 MED FILL — VENCLEXTA 100 MG TABS: 100 | 30 days supply | Qty: 30 | Fill #4

## 2019-05-16 ENCOUNTER — Telehealth: Payer: Self-pay | Admitting: *Deleted

## 2019-05-16 NOTE — Telephone Encounter (Signed)
Pt called to this RN stating she fell last week - and hit both her knees and her elbow. She has noticed since 2x she was sitting down drinking water and then a coffee ( 2 separate days ) " my hand kinda jerks and it hits my cup and spills ".  She states elbow is mildly sore and she elicits pain with hand movement ( like turning a door knob ).  She also states she is more week than usual and has been feeling more depressed " though I should be very thankful because I have gotten both my Covid vaccines "  This RN discussed above - including issues of depression - including offered for pt to come in for evaluation and xray.  Rachel [name] declined need to scan or visit " just wanted to talk to you I see Dr Jana Hakim soon "  This RN again validated her feelings as well as encouraged her continue to get outside with the nice weather- with Caffie stating she will be going to the beach this weekend and is looking forward to "walking on the beach and feeling the sand in my toes ".  Per end of call- pt verbalized understanding to call if symptoms worsen or she has other concerns.  T

## 2019-05-17 ENCOUNTER — Other Ambulatory Visit: Payer: Self-pay | Admitting: Oncology

## 2019-05-24 ENCOUNTER — Other Ambulatory Visit: Payer: Self-pay | Admitting: Oncology

## 2019-05-30 ENCOUNTER — Inpatient Hospital Stay: Payer: Medicare Other

## 2019-05-30 ENCOUNTER — Inpatient Hospital Stay: Payer: Medicare Other | Attending: Oncology

## 2019-05-30 ENCOUNTER — Other Ambulatory Visit: Payer: Self-pay

## 2019-05-30 DIAGNOSIS — C9110 Chronic lymphocytic leukemia of B-cell type not having achieved remission: Secondary | ICD-10-CM

## 2019-05-30 DIAGNOSIS — Z95828 Presence of other vascular implants and grafts: Secondary | ICD-10-CM

## 2019-05-30 LAB — COMPREHENSIVE METABOLIC PANEL
ALT: 35 U/L (ref 0–44)
AST: 32 U/L (ref 15–41)
Albumin: 3.5 g/dL (ref 3.5–5.0)
Alkaline Phosphatase: 101 U/L (ref 38–126)
Anion gap: 8 (ref 5–15)
BUN: 14 mg/dL (ref 8–23)
CO2: 26 mmol/L (ref 22–32)
Calcium: 8.4 mg/dL — ABNORMAL LOW (ref 8.9–10.3)
Chloride: 107 mmol/L (ref 98–111)
Creatinine, Ser: 0.92 mg/dL (ref 0.44–1.00)
GFR calc Af Amer: >60 mL/min (ref >60)
GFR calc non Af Amer: >60 mL/min (ref >60)
Glucose, Bld: 94 mg/dL (ref 70–99)
Potassium: 3.9 mmol/L (ref 3.5–5.1)
Sodium: 141 mmol/L (ref 135–145)
Total Bilirubin: 0.4 mg/dL (ref 0.3–1.2)
Total Protein: 5.8 g/dL — ABNORMAL LOW (ref 6.5–8.1)

## 2019-05-30 LAB — LACTATE DEHYDROGENASE: LDH: 240 U/L — ABNORMAL HIGH (ref 98–192)

## 2019-05-30 LAB — MAGNESIUM: Magnesium: 1.8 mg/dL (ref 1.7–2.4)

## 2019-05-30 MED ORDER — SODIUM CHLORIDE 0.9% FLUSH
10.0000 mL | Freq: Once | INTRAVENOUS | Status: DC
  Filled 2019-05-30: qty 10

## 2019-05-30 MED ORDER — HEPARIN SOD (PORK) LOCK FLUSH 100 UNIT/ML IV SOLN
500.0000 [IU] | Freq: Once | INTRAVENOUS | Status: DC
  Filled 2019-05-30: qty 5

## 2019-06-01 LAB — BETA 2 MICROGLOBULIN, SERUM: Beta-2 Microglobulin: 3 mg/L — ABNORMAL HIGH (ref 0.6–2.4)

## 2019-06-06 ENCOUNTER — Other Ambulatory Visit: Payer: Medicare Other

## 2019-06-06 MED FILL — VENCLEXTA 100 MG TABS: 100 | 30 days supply | Qty: 30 | Fill #5

## 2019-06-11 ENCOUNTER — Other Ambulatory Visit: Payer: Self-pay | Admitting: Oncology

## 2019-06-25 DIAGNOSIS — L03116 Cellulitis of left lower limb: Secondary | ICD-10-CM | POA: Diagnosis not present

## 2019-06-25 DIAGNOSIS — W57XXXA Bitten or stung by nonvenomous insect and other nonvenomous arthropods, initial encounter: Secondary | ICD-10-CM | POA: Diagnosis not present

## 2019-06-28 ENCOUNTER — Telehealth: Payer: Self-pay | Admitting: *Deleted

## 2019-06-28 DIAGNOSIS — R21 Rash and other nonspecific skin eruption: Secondary | ICD-10-CM | POA: Diagnosis not present

## 2019-06-28 DIAGNOSIS — C9110 Chronic lymphocytic leukemia of B-cell type not having achieved remission: Secondary | ICD-10-CM | POA: Diagnosis not present

## 2019-06-28 DIAGNOSIS — W57XXXS Bitten or stung by nonvenomous insect and other nonvenomous arthropods, sequela: Secondary | ICD-10-CM | POA: Diagnosis not present

## 2019-06-28 DIAGNOSIS — S80861S Insect bite (nonvenomous), right lower leg, sequela: Secondary | ICD-10-CM | POA: Diagnosis not present

## 2019-06-28 NOTE — Telephone Encounter (Signed)
This RN spoke with pt per her VM stating she went out of town last week and " thought I got more bites on my legs "  She went to her primary MD who stated they were a " bullous rash " medications given and rash now improving.   She is being sent to a dermatologist for evaluation as well due to concern for possible autoimmune rash and need for biopsy.  She is scheduled to for visit in this office late May with Dr Jana Hakim.  No further needs- she will let us know if further issues occur.

## 2019-06-30 DIAGNOSIS — L139 Bullous disorder, unspecified: Secondary | ICD-10-CM | POA: Diagnosis not present

## 2019-07-02 ENCOUNTER — Encounter (HOSPITAL_COMMUNITY): Payer: Self-pay

## 2019-07-02 ENCOUNTER — Emergency Department (HOSPITAL_COMMUNITY): Payer: Medicare Other

## 2019-07-02 ENCOUNTER — Inpatient Hospital Stay (HOSPITAL_COMMUNITY)
Admission: EM | Admit: 2019-07-02 | Discharge: 2019-07-05 | DRG: 982 | Disposition: A | Discharge status: Home Health | Payer: Medicare Other | Attending: Physician Assistant | Admitting: Physician Assistant

## 2019-07-02 ENCOUNTER — Other Ambulatory Visit: Payer: Self-pay

## 2019-07-02 DIAGNOSIS — D62 Acute posthemorrhagic anemia: Secondary | ICD-10-CM | POA: Diagnosis present

## 2019-07-02 DIAGNOSIS — C9110 Chronic lymphocytic leukemia of B-cell type not having achieved remission: Secondary | ICD-10-CM | POA: Diagnosis present

## 2019-07-02 DIAGNOSIS — Z880 Allergy status to penicillin: Secondary | ICD-10-CM

## 2019-07-02 DIAGNOSIS — S22078A Other fracture of T9-T10 vertebra, initial encounter for closed fracture: Secondary | ICD-10-CM | POA: Diagnosis present

## 2019-07-02 DIAGNOSIS — M542 Cervicalgia: Secondary | ICD-10-CM | POA: Diagnosis not present

## 2019-07-02 DIAGNOSIS — Z20822 Contact with and (suspected) exposure to covid-19: Secondary | ICD-10-CM | POA: Diagnosis present

## 2019-07-02 DIAGNOSIS — S0100XA Unspecified open wound of scalp, initial encounter: Secondary | ICD-10-CM | POA: Diagnosis not present

## 2019-07-02 DIAGNOSIS — W19XXXA Unspecified fall, initial encounter: Secondary | ICD-10-CM

## 2019-07-02 DIAGNOSIS — S22079D Unspecified fracture of T9-T10 vertebra, subsequent encounter for fracture with routine healing: Secondary | ICD-10-CM | POA: Diagnosis not present

## 2019-07-02 DIAGNOSIS — S199XXA Unspecified injury of neck, initial encounter: Secondary | ICD-10-CM | POA: Diagnosis not present

## 2019-07-02 DIAGNOSIS — J4520 Mild intermittent asthma, uncomplicated: Secondary | ICD-10-CM | POA: Diagnosis present

## 2019-07-02 DIAGNOSIS — S22049A Unspecified fracture of fourth thoracic vertebra, initial encounter for closed fracture: Secondary | ICD-10-CM | POA: Diagnosis not present

## 2019-07-02 DIAGNOSIS — Z23 Encounter for immunization: Secondary | ICD-10-CM | POA: Diagnosis not present

## 2019-07-02 DIAGNOSIS — S22070A Wedge compression fracture of T9-T10 vertebra, initial encounter for closed fracture: Secondary | ICD-10-CM | POA: Diagnosis not present

## 2019-07-02 DIAGNOSIS — R109 Unspecified abdominal pain: Secondary | ICD-10-CM | POA: Diagnosis not present

## 2019-07-02 DIAGNOSIS — I700 Atherosclerosis of aorta: Secondary | ICD-10-CM | POA: Diagnosis present

## 2019-07-02 DIAGNOSIS — R000 Tachycardia, unspecified: Secondary | ICD-10-CM | POA: Diagnosis not present

## 2019-07-02 DIAGNOSIS — S0101XA Laceration without foreign body of scalp, initial encounter: Principal | ICD-10-CM

## 2019-07-02 DIAGNOSIS — Y92019 Unspecified place in single-family (private) house as the place of occurrence of the external cause: Secondary | ICD-10-CM

## 2019-07-02 DIAGNOSIS — T07XXXA Unspecified multiple injuries, initial encounter: Secondary | ICD-10-CM | POA: Diagnosis not present

## 2019-07-02 DIAGNOSIS — Z818 Family history of other mental and behavioral disorders: Secondary | ICD-10-CM

## 2019-07-02 DIAGNOSIS — W11XXXA Fall on and from ladder, initial encounter: Secondary | ICD-10-CM | POA: Diagnosis present

## 2019-07-02 DIAGNOSIS — Z01818 Encounter for other preprocedural examination: Secondary | ICD-10-CM | POA: Diagnosis not present

## 2019-07-02 DIAGNOSIS — R079 Chest pain, unspecified: Secondary | ICD-10-CM | POA: Diagnosis not present

## 2019-07-02 DIAGNOSIS — S22079A Unspecified fracture of T9-T10 vertebra, initial encounter for closed fracture: Secondary | ICD-10-CM | POA: Diagnosis not present

## 2019-07-02 DIAGNOSIS — S3993XA Unspecified injury of pelvis, initial encounter: Secondary | ICD-10-CM | POA: Diagnosis not present

## 2019-07-02 DIAGNOSIS — S299XXA Unspecified injury of thorax, initial encounter: Secondary | ICD-10-CM | POA: Diagnosis not present

## 2019-07-02 DIAGNOSIS — M069 Rheumatoid arthritis, unspecified: Secondary | ICD-10-CM | POA: Diagnosis present

## 2019-07-02 DIAGNOSIS — S3991XA Unspecified injury of abdomen, initial encounter: Secondary | ICD-10-CM | POA: Diagnosis not present

## 2019-07-02 DIAGNOSIS — S069X1A Unspecified intracranial injury with loss of consciousness of 30 minutes or less, initial encounter: Secondary | ICD-10-CM | POA: Diagnosis present

## 2019-07-02 LAB — CBC
HCT: 44.6 % (ref 36.0–46.0)
Hemoglobin: 14.3 g/dL (ref 12.0–15.0)
MCH: 28.3 pg (ref 26.0–34.0)
MCHC: 32.1 g/dL (ref 30.0–36.0)
MCV: 88.1 fL (ref 80.0–100.0)
Platelets: 123 10*3/uL — ABNORMAL LOW (ref 150–400)
RBC: 5.06 MIL/uL (ref 3.87–5.11)
RDW: 13.1 % (ref 11.5–15.5)
WBC: 5.8 10*3/uL (ref 4.0–10.5)
nRBC: 0 % (ref 0.0–0.2)

## 2019-07-02 LAB — COMPREHENSIVE METABOLIC PANEL
ALT: 48 U/L — ABNORMAL HIGH (ref 0–44)
AST: 49 U/L — ABNORMAL HIGH (ref 15–41)
Albumin: 3.8 g/dL (ref 3.5–5.0)
Alkaline Phosphatase: 106 U/L (ref 38–126)
Anion gap: 12 (ref 5–15)
BUN: 12 mg/dL (ref 8–23)
CO2: 25 mmol/L (ref 22–32)
Calcium: 9.3 mg/dL (ref 8.9–10.3)
Chloride: 105 mmol/L (ref 98–111)
Creatinine, Ser: 1.12 mg/dL — ABNORMAL HIGH (ref 0.44–1.00)
GFR calc Af Amer: 58 mL/min — ABNORMAL LOW (ref >60)
GFR calc non Af Amer: 50 mL/min — ABNORMAL LOW (ref >60)
Glucose, Bld: 126 mg/dL — ABNORMAL HIGH (ref 70–99)
Potassium: 3.9 mmol/L (ref 3.5–5.1)
Sodium: 142 mmol/L (ref 135–145)
Total Bilirubin: 0.7 mg/dL (ref 0.3–1.2)
Total Protein: 6.2 g/dL — ABNORMAL LOW (ref 6.5–8.1)

## 2019-07-02 LAB — URINALYSIS, ROUTINE W REFLEX MICROSCOPIC
Bacteria, UA: NONE SEEN
Bilirubin Urine: NEGATIVE
Glucose, UA: NEGATIVE mg/dL
Ketones, ur: 5 mg/dL — AB
Leukocytes,Ua: NEGATIVE
Nitrite: NEGATIVE
Protein, ur: NEGATIVE mg/dL
Specific Gravity, Urine: >1.046 — ABNORMAL HIGH (ref 1.005–1.030)
pH: 7 (ref 5.0–8.0)

## 2019-07-02 LAB — I-STAT CHEM 8, ED
BUN: 15 mg/dL (ref 8–23)
Calcium, Ion: 1.01 mmol/L — ABNORMAL LOW (ref 1.15–1.40)
Chloride: 110 mmol/L (ref 98–111)
Creatinine, Ser: 0.8 mg/dL (ref 0.44–1.00)
Glucose, Bld: 105 mg/dL — ABNORMAL HIGH (ref 70–99)
HCT: 36 % (ref 36.0–46.0)
Hemoglobin: 12.2 g/dL (ref 12.0–15.0)
Potassium: 4.3 mmol/L (ref 3.5–5.1)
Sodium: 142 mmol/L (ref 135–145)
TCO2: 24 mmol/L (ref 22–32)

## 2019-07-02 LAB — SAMPLE TO BLOOD BANK

## 2019-07-02 LAB — PROTIME-INR
INR: 0.9 (ref 0.8–1.2)
Prothrombin Time: 12.1 seconds (ref 11.4–15.2)

## 2019-07-02 LAB — LACTIC ACID, PLASMA: Lactic Acid, Venous: 1.7 mmol/L (ref 0.5–1.9)

## 2019-07-02 LAB — RESPIRATORY PANEL BY RT PCR (FLU A&B, COVID)
Influenza A by PCR: NEGATIVE
Influenza B by PCR: NEGATIVE
SARS Coronavirus 2 by RT PCR: NEGATIVE

## 2019-07-02 LAB — ETHANOL: Alcohol, Ethyl (B): <10 mg/dL (ref <10)

## 2019-07-02 MED ORDER — TRAZODONE HCL 100 MG PO TABS
100.0000 mg | ORAL_TABLET | Freq: Every day | ORAL | Status: DC
  Administered 2019-07-02 – 2019-07-04 (×3): 100 mg via ORAL
  Filled 2019-07-02 (×3): qty 1

## 2019-07-02 MED ORDER — POTASSIUM CHLORIDE IN NACL 20-0.9 MEQ/L-% IV SOLN
INTRAVENOUS | Status: DC
  Filled 2019-07-02 (×2): qty 1000

## 2019-07-02 MED ORDER — ALLOPURINOL 300 MG PO TABS
300.0000 mg | ORAL_TABLET | Freq: Every day | ORAL | Status: DC
  Administered 2019-07-03 – 2019-07-05 (×3): 300 mg via ORAL
  Filled 2019-07-02 (×3): qty 1

## 2019-07-02 MED ORDER — OXYCODONE HCL 5 MG PO TABS
10.0000 mg | ORAL_TABLET | ORAL | Status: DC | PRN
  Administered 2019-07-03 – 2019-07-04 (×4): 10 mg via ORAL
  Filled 2019-07-02 (×4): qty 2

## 2019-07-02 MED ORDER — ONDANSETRON 4 MG PO TBDP: 4.0000 mg | ORAL_TABLET | Freq: Four times a day (QID) | ORAL | Status: DC | PRN

## 2019-07-02 MED ORDER — ACYCLOVIR 400 MG PO TABS
400.0000 mg | ORAL_TABLET | Freq: Two times a day (BID) | ORAL | Status: DC
  Administered 2019-07-03 – 2019-07-05 (×6): 400 mg via ORAL
  Filled 2019-07-02 (×6): qty 1

## 2019-07-02 MED ORDER — AZELASTINE HCL 0.1 % NA SOLN
1.0000 | Freq: Two times a day (BID) | NASAL | Status: DC
  Administered 2019-07-03 – 2019-07-04 (×4): 1 via NASAL
  Filled 2019-07-02: qty 30

## 2019-07-02 MED ORDER — LIDOCAINE-EPINEPHRINE (PF) 2 %-1:200000 IJ SOLN
20.0000 mL | Freq: Once | INTRAMUSCULAR | Status: AC
  Administered 2019-07-02: 20 mL
  Filled 2019-07-02: qty 20

## 2019-07-02 MED ORDER — FENTANYL CITRATE (PF) 100 MCG/2ML IJ SOLN
50.0000 ug | Freq: Once | INTRAMUSCULAR | Status: AC
  Administered 2019-07-02: 50 ug via INTRAVENOUS
  Filled 2019-07-02: qty 2

## 2019-07-02 MED ORDER — TRAMADOL HCL 50 MG PO TABS
50.0000 mg | ORAL_TABLET | Freq: Four times a day (QID) | ORAL | Status: DC | PRN
  Administered 2019-07-03: 50 mg via ORAL
  Filled 2019-07-02: qty 1

## 2019-07-02 MED ORDER — SODIUM CHLORIDE 0.9 % IV BOLUS
500.0000 mL | Freq: Once | INTRAVENOUS | Status: AC
  Administered 2019-07-02: 500 mL via INTRAVENOUS

## 2019-07-02 MED ORDER — ONDANSETRON HCL 4 MG/2ML IJ SOLN: 4.0000 mg | Freq: Four times a day (QID) | INTRAMUSCULAR | Status: DC | PRN

## 2019-07-02 MED ORDER — LIDOCAINE-EPINEPHRINE (PF) 2 %-1:200000 IJ SOLN
INTRAMUSCULAR | Status: AC
  Filled 2019-07-02: qty 20

## 2019-07-02 MED ORDER — IOHEXOL 300 MG/ML  SOLN
100.0000 mL | Freq: Once | INTRAMUSCULAR | Status: AC | PRN
  Administered 2019-07-02: 100 mL via INTRAVENOUS

## 2019-07-02 MED ORDER — VENETOCLAX 100 MG PO TABS
100.0000 mg | ORAL_TABLET | Freq: Every day | ORAL | Status: DC
  Administered 2019-07-03 – 2019-07-05 (×3): 100 mg via ORAL
  Filled 2019-07-02 (×3): qty 1

## 2019-07-02 MED ORDER — PROGESTERONE MICRONIZED 200 MG PO CAPS
200.0000 mg | ORAL_CAPSULE | Freq: Every day | ORAL | Status: DC
  Administered 2019-07-03 – 2019-07-04 (×3): 200 mg via ORAL
  Filled 2019-07-02 (×4): qty 1

## 2019-07-02 MED ORDER — MONTELUKAST SODIUM 10 MG PO TABS
10.0000 mg | ORAL_TABLET | Freq: Every day | ORAL | Status: DC
  Administered 2019-07-02 – 2019-07-04 (×3): 10 mg via ORAL
  Filled 2019-07-02 (×3): qty 1

## 2019-07-02 MED ORDER — HYDROMORPHONE HCL 1 MG/ML IJ SOLN
0.5000 mg | INTRAMUSCULAR | Status: DC | PRN
  Administered 2019-07-02: 0.5 mg via INTRAVENOUS
  Filled 2019-07-02: qty 1

## 2019-07-02 MED ORDER — IBRUTINIB 280 MG PO TABS
280.0000 mg | ORAL_TABLET | Freq: Every day | ORAL | Status: DC
  Administered 2019-07-03 – 2019-07-05 (×3): 280 mg via ORAL
  Filled 2019-07-02 (×3): qty 1

## 2019-07-02 MED ORDER — METHOCARBAMOL 1000 MG/10ML IJ SOLN
1000.0000 mg | Freq: Three times a day (TID) | INTRAVENOUS | Status: DC | PRN
  Filled 2019-07-02: qty 10

## 2019-07-02 MED ORDER — OXYCODONE HCL 5 MG PO TABS
5.0000 mg | ORAL_TABLET | ORAL | Status: DC | PRN
  Filled 2019-07-02: qty 1

## 2019-07-02 MED ORDER — DOCUSATE SODIUM 100 MG PO CAPS
100.0000 mg | ORAL_CAPSULE | Freq: Two times a day (BID) | ORAL | Status: DC
  Administered 2019-07-03 – 2019-07-04 (×3): 100 mg via ORAL
  Filled 2019-07-02 (×5): qty 1

## 2019-07-02 MED ORDER — TETANUS-DIPHTH-ACELL PERTUSSIS 5-2.5-18.5 LF-MCG/0.5 IM SUSP
0.5000 mL | Freq: Once | INTRAMUSCULAR | Status: AC
  Administered 2019-07-02: 0.5 mL via INTRAMUSCULAR
  Filled 2019-07-02: qty 0.5

## 2019-07-02 NOTE — ED Provider Notes (Signed)
At change of shift this patient was signed out to me pending neurosurgical consultation as well as repair by specialist.  T9 fracture present, discussed with Dr. Marcello Moores of neurosurgery who requests MRI prior to deciding whether surgical intervention would be needed versus TLSO and discharge.  4:11 PM  ..Laceration Repair  Date/Time: 07/02/2019 7:33 PM Performed by: Noemi Chapel, MD Authorized by: Noemi Chapel, MD   Consent:    Consent obtained:  Verbal   Consent given by:  Patient   Risks discussed:  Infection, pain, need for additional repair, poor cosmetic result and poor wound healing   Alternatives discussed:  No treatment and delayed treatment Anesthesia (see MAR for exact dosages):    Anesthesia method:  Local infiltration   Local anesthetic:  Lidocaine 1% WITH epi Laceration details:    Location:  Scalp   Scalp location:  Mid-scalp   Length (cm):  30   Depth (mm):  4 Repair type:    Repair type:  Simple Pre-procedure details:    Preparation:  Patient was prepped and draped in usual sterile fashion and imaging obtained to evaluate for foreign bodies Exploration:    Hemostasis achieved with:  Direct pressure and epinephrine   Wound exploration: wound explored through full range of motion and entire depth of wound probed and visualized     Wound extent: no fascia violation noted, no foreign bodies/material noted, no muscle damage noted, no nerve damage noted, no tendon damage noted, no underlying fracture noted and no vascular damage noted     Contaminated: no   Treatment:    Area cleansed with:  Betadine and saline   Amount of cleaning:  Extensive   Irrigation solution:  Sterile saline   Irrigation volume:  306mL   Irrigation method:  Syringe Skin repair:    Repair method:  Sutures and staples   Suture size:  5-0   Wound skin closure material used: vicryl - X 1 for suture ligation of artery.   Suture technique: suture ligate X 1 - staple X 22.   Number of sutures:  1  Number of staples:  22 Approximation:    Approximation:  Close Post-procedure details:    Dressing:  Sterile dressing, adhesive bandage and bulky dressing   Patient tolerance of procedure:  Tolerated well, no immediate complications Comments:     This patient scalp injury involves multiple flaps, approximately 30 cm in total length, this required multiple liters of irrigation to fully cleanse the wound and remove clot.  It was apparent that there was a piece of galea that was missing, there is nothing to close over top of that and thus only the skin was closed.   I discussed the care with Dr. Grandville Silos on the trauma surgery service who is agreeable to see this patient in the office in follow-up if she is able to be discharged.  Her scalp wound was very complicated however it is now hemostatic, no more bleeding, there is no signs of depressed skull fracture  Clinical Impression:   Final diagnoses:  Laceration of scalp, initial encounter  Fall, initial encounter  Closed fracture of ninth thoracic vertebra, unspecified fracture morphology, initial encounter ([facility])         Noemi Chapel, MD 07/04/19 1438

## 2019-07-02 NOTE — ED Notes (Signed)
Patient dressing on head changed. QuickClot gauze placed on head injury, reinforced with gauze and wrapped around with kerlex.

## 2019-07-02 NOTE — ED Provider Notes (Signed)
[facility] EMERGENCY DEPARTMENT Provider Note   CSN: [id number] Arrival date & time: 07/02/19  1301     History Chief Complaint  Patient presents with  . Fall  . Head Injury    Rachel [name] is a 69 y.o. female.  Patient is a 69 year old female with a history of CLL and rheumatoid arthritis who presents after a fall.  She was climbing up an attic pulled out ladder and got about halfway up and fell back to the ground.  She tripped and fallen back.  She had a brief loss of consciousness but did hit her head on something on the ground.  She complains of pain to her head and a wound to her head.  She also has some neck and back pain although she says this is somewhat chronic for her.  She has pain behind her right scapula and across her ribs bilaterally.  She denies any other injury.  She is not on anticoagulants.        Past Medical History:  Diagnosis Date  . 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 Diverticulitis   . Leukemia ([place])   . Lower back pain     Patient Active Problem List   Diagnosis Date Noted  . Aortic atherosclerosis ([place]) 01/07/2018  . Chronic rhinitis 11/03/2017  . Mild intermittent asthma 11/03/2017  . History of food allergy 11/03/2017  . 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 Bruising 05/12/2016  . Thrombocytopenia ([place]) 05/12/2016  . Port catheter in place 10/19/2015  . Dehydration 06/26/2015  . Transaminitis 06/26/2015  . Nausea with vomiting 06/26/2015  . Diverticulitis 06/25/2015  . Vaginal discharge 01/31/2014  . Low grade squamous intraepithelial lesion (LGSIL) on Papanicolaou smear of cervix 01/31/2014  . Headache disorder 01/05/2014  . CLL (chronic lymphocytic leukemia) ([place]) 07/17/2011    Past Surgical History:  Procedure Laterality Date  . BREAST SURGERY  2015   left breast bx  . BUNIONECTOMY     right foot  . lining of uterus removed    . OTHER SURGICAL HISTORY     removal of uterine lining  .  TONSILLECTOMY       OB History    Gravida  3   Para      Term      Preterm      AB      Living  3     SAB      TAB      Ectopic      Multiple      Live Births  3           Family History  Problem Relation Age of Onset  . Depression Son     Social History   Tobacco Use  . Smoking status: Never Smoker  . Smokeless tobacco: Never Used  Substance Use Topics  . Alcohol use: Yes    Alcohol/week: 0.0 standard drinks    Comment: socially  . Drug use: No    Home Medications Prior to Admission medications   Medication Sig Start Date End Date Taking? Authorizing Provider  acetaminophen (TYLENOL) 500 MG tablet Take 500-1,000 mg by mouth every 6 (six) hours as needed for headache.     [provider]  acyclovir (ZOVIRAX) 400 MG tablet Take 1 tablet (400 mg total) by mouth 2 (two) times daily. 07/13/18   Magrinat, Virgie Dad, MD  allopurinol (ZYLOPRIM) 300 MG tablet TAKE 1 TABLET DAILY 06/14/19   Magrinat, Virgie Dad, MD  azelastine (ASTELIN) 0.1 % nasal spray Place into both nostrils 2 (two) times daily. Use in each nostril as directed    [provider]  Calcium Citrate-Vitamin D 200-250 MG-UNIT TABS Take 1 tablet by mouth 4 (four) times daily.    [provider]  cholestyramine (QUESTRAN) 4 g packet MIX AND DRINK 1 PACKET(4 GRAMS) BY MOUTH DAILY 05/24/19   Magrinat, Virgie Dad, MD  Estradiol (ELESTRIN) 0.52 MG/0.87 GM (0.06%) GEL APPLY 2 PUMPS THINLY TO UPPER ARM DAILY AT BEDTIME 04/04/19   [name], MD  famotidine (PEPCID) 20 MG tablet Take 20 mg by mouth 2 (two) times daily.    [provider]  Ibrutinib 280 MG TABS Take 280 mg by mouth daily. Take with water at approximately the same time each day 01/06/19   Magrinat, Virgie Dad, MD  lidocaine-prilocaine (EMLA) cream Apply a nickel size amount to skin on top of port- cover with saran wrap at least 1 hour before access 05/07/16   Magrinat, Virgie Dad, MD  montelukast (SINGULAIR) 10 MG  tablet Take 10 mg by mouth at bedtime.    [provider]  prochlorperazine (COMPAZINE) 5 MG tablet TAKE 1 TO 2 TABLETS(5 TO 10 MG) BY MOUTH EVERY 6 HOURS AS NEEDED FOR NAUSEA OR VOMITING Patient not taking: Reported on 01/31/2019 01/06/19   Magrinat, Virgie Dad, MD  progesterone (PROMETRIUM) 200 MG capsule Take 1 capsule (200 mg total) by mouth at bedtime. 04/04/19   [name], MD  Specialty Vitamins Products (MAGNESIUM, AMINO ACID CHELATE,) 133 MG tablet Take by mouth 2 (two) times daily.    [provider]  traZODone (DESYREL) 100 MG tablet Take 1 tablet (100 mg total) by mouth at bedtime. 12/31/18   [name], MD  venetoclax 100 MG TABS Take 100 mg by mouth daily. Take with food and water at approximately the same time each day. 01/06/19   Magrinat, Virgie Dad, MD  VITAMIN D PO Take 5,000 Units by mouth daily.     [provider]    Allergies    Penicillins  Review of Systems   Review of Systems  Constitutional: Negative for activity change, appetite change and fever.  HENT: Negative for dental problem, nosebleeds and trouble swallowing.   Eyes: Negative for pain and visual disturbance.  Respiratory: Negative for shortness of breath.   Cardiovascular: Positive for chest pain.  Gastrointestinal: Positive for abdominal pain. Negative for nausea and vomiting.  Genitourinary: Negative for dysuria and hematuria.  Musculoskeletal: Positive for back pain and neck pain. Negative for arthralgias and joint swelling.  Skin: Positive for wound.  Neurological: Positive for headaches. Negative for weakness and numbness.  Psychiatric/Behavioral: Negative for confusion.    Physical Exam Updated Vital Signs BP (!) 122/56   Pulse 80   Temp 98.2 F (36.8 C) (Oral)   Resp 16   Ht 4\' 10"  (1.473 m)   Wt 53.5 kg   SpO2 96%   BMI 24.66 kg/m   Physical Exam Vitals reviewed.  Constitutional:      Appearance: She is well-developed.  HENT:     Head:  Normocephalic.     Comments: Large degloving injury to her mid scalp    Nose: Nose normal.  Eyes:     Conjunctiva/sclera: Conjunctivae normal.     Pupils: Pupils are equal, round, and reactive to light.  Neck:     Comments: C-collar in place.  She has some tenderness along palpation of her cervical spine and  lumbosacral spine.  No pain to the thoracic spine.  No step-off or deformities noted. Cardiovascular:     Rate and Rhythm: Normal rate and regular rhythm.     Heart sounds: No murmur.     Comments: Positive tenderness on palpation of the ribs bilaterally, more on the left.  There is also pain along the right scapula with some overlying ecchymosis. Pulmonary:     Effort: Pulmonary effort is normal. No respiratory distress.     Breath sounds: Normal breath sounds. No wheezing.  Chest:     Chest wall: No tenderness.  Abdominal:     General: Bowel sounds are normal. There is no distension.     Palpations: Abdomen is soft.     Tenderness: There is abdominal tenderness (Mild tenderness across the upper abdomen).  Musculoskeletal:        General: Normal range of motion.     Comments: No pain on palpation or ROM of the extremities  Skin:    General: Skin is warm and dry.     Capillary Refill: Capillary refill takes less than 2 seconds.  Neurological:     Mental Status: She is alert and oriented to person, place, and time.       ED Results / Procedures / Treatments   Labs (all labs ordered are listed, but only abnormal results are displayed) Labs Reviewed  COMPREHENSIVE METABOLIC PANEL - Abnormal; Notable for the following components:      Result Value   Glucose, Bld 126 (*)    Creatinine, Ser 1.12 (*)    Total Protein 6.2 (*)    AST 49 (*)    ALT 48 (*)    GFR calc non Af Amer 50 (*)    GFR calc Af Amer 58 (*)    All other components within normal limits  CBC - Abnormal; Notable for the following components:   Platelets 123 (*)    All other components within normal  limits  I-STAT CHEM 8, ED - Abnormal; Notable for the following components:   Glucose, Bld 105 (*)    Calcium, Ion 1.01 (*)    All other components within normal limits  ETHANOL  LACTIC ACID, PLASMA  PROTIME-INR  URINALYSIS, ROUTINE W REFLEX MICROSCOPIC  SAMPLE TO BLOOD BANK    EKG EKG Interpretation  Date/Time:  Saturday Jul 02 2019 13:05:28 EDT Ventricular Rate:  93 PR Interval:    QRS Duration: 91 QT Interval:  344 QTC Calculation: 428 R Axis:   33 Text Interpretation: Sinus rhythm Low voltage, precordial leads since last tracing no significant change Confirmed by Malvin Johns [phone]) on 07/02/2019 1:12:30 PM   Radiology CT HEAD WO CONTRAST  Result Date: 07/02/2019 CLINICAL DATA:  Pain status post fall from ladder. Laceration and bleeding to the back of the head. EXAM: CT HEAD WITHOUT CONTRAST CT CERVICAL SPINE WITHOUT CONTRAST CT CHEST, ABDOMEN AND PELVIS WITHOUT CONTRAST TECHNIQUE: Contiguous axial images were obtained from the base of the skull through the vertex without intravenous contrast. Multidetector CT imaging of the cervical spine was performed without intravenous contrast. Multiplanar CT image reconstructions were also generated. Multidetector CT imaging of the chest, abdomen and pelvis was performed following the standard protocol without IV contrast. COMPARISON:  CT of the pelvis dated October 22, 2018. PET-CT dated January 06, 2018. FINDINGS: CT HEAD FINDINGS Brain: No evidence of acute infarction, hemorrhage, hydrocephalus, extra-axial collection or mass lesion/mass effect. There is a small punctate density in the anterior left frontal lobe (axial  series 4, image 25). This is favored to represent a small calcification as opposed to subarachnoid hemorrhage. There are bilateral hyperdensities in the CP angles favored to represent artifact. Vascular: No hyperdense vessel or unexpected calcification. Skull: There is extensive left-sided scalp swelling with pockets of  subcutaneous gas and a large scalp laceration. There is no acute displaced calvarial fracture. Sinuses/Orbits: No acute finding. Other: None. CT CERVICAL FINDINGS Alignment: Normal. Skull base and vertebrae: No acute fracture. No primary bone lesion or focal pathologic process. Soft tissues and spinal canal: No prevertebral fluid or swelling. No visible canal hematoma. Disc levels:  Mild disc height loss is noted in the cervical spine. Other: None. CT CHEST FINDINGS Cardiovascular: The heart size is unremarkable. There is a well-positioned right-sided Port-A-Cath in place. There is no evidence for thoracic aortic dissection or aneurysm. There is no large centrally located pulmonary embolism. No significant pericardial effusion. Mediastinum/Nodes: --No mediastinal or hilar lymphadenopathy. --No axillary lymphadenopathy. --No supraclavicular lymphadenopathy. --Normal thyroid gland. --The esophagus is unremarkable Lungs/Pleura: No pulmonary nodules or masses. No pleural effusion or pneumothorax. No focal airspace consolidation. No focal pleural abnormality. Musculoskeletal: There is no evidence for rib fracture. There is an acute fracture of the T9 vertebral body. This fracture extends superiorly through both endplates and posteriorly into the posterior elements including the bilateral lamina. There is no significant retropulsion at this level. CT ABDOMEN AND PELVIS FINDINGS Hepatobiliary: The liver is normal. Normal gallbladder.There is no biliary ductal dilation. Pancreas: Normal contours without ductal dilatation. No peripancreatic fluid collection. Spleen: Unremarkable. Adrenals/Urinary Tract: --Adrenal glands: Unremarkable. --Right kidney/ureter: No hydronephrosis or radiopaque kidney stones. --Left kidney/ureter: No hydronephrosis or radiopaque kidney stones. --Urinary bladder: Unremarkable. Stomach/Bowel: --Stomach/Duodenum: No hiatal hernia or other gastric abnormality. Normal duodenal course and caliber.  --Small bowel: Unremarkable. --Colon: There is scattered colonic diverticula without CT evidence for diverticulitis. --Appendix: Normal. Vascular/Lymphatic: Atherosclerotic calcification is present within the non-aneurysmal abdominal aorta, without hemodynamically significant stenosis. --No retroperitoneal lymphadenopathy. --No mesenteric lymphadenopathy. --No pelvic or inguinal lymphadenopathy. Reproductive: Unremarkable Other: No ascites or free air. There is a small fat containing umbilical hernia. Musculoskeletal. There is severe disc height loss at the L5-S1 level. There is no acute lumbar spine fracture. IMPRESSION: 1. Extensive left-sided scalp swelling with pockets of subcutaneous gas and a large scalp laceration. No acute displaced calvarial fracture. 2. No acute intracranial abnormality. 3. No acute fracture or malalignment of the cervical spine. 4. Acute T9 fracture involving the posterior elements consistent with a posterior tension band disruption. 5. No acute solid organ injury within the chest, abdomen, or pelvis. Aortic Atherosclerosis (ICD10-I70.0). Electronically Signed   By: Constance Holster M.D.   On: 07/02/2019 15:46   CT CHEST W CONTRAST  Result Date: 07/02/2019 CLINICAL DATA:  Pain status post fall from ladder. Laceration and bleeding to the back of the head. EXAM: CT HEAD WITHOUT CONTRAST CT CERVICAL SPINE WITHOUT CONTRAST CT CHEST, ABDOMEN AND PELVIS WITHOUT CONTRAST TECHNIQUE: Contiguous axial images were obtained from the base of the skull through the vertex without intravenous contrast. Multidetector CT imaging of the cervical spine was performed without intravenous contrast. Multiplanar CT image reconstructions were also generated. Multidetector CT imaging of the chest, abdomen and pelvis was performed following the standard protocol without IV contrast. COMPARISON:  CT of the pelvis dated October 22, 2018. PET-CT dated January 06, 2018. FINDINGS: CT HEAD FINDINGS Brain: No evidence  of acute infarction, hemorrhage, hydrocephalus, extra-axial collection or mass lesion/mass effect. There is a small punctate density in  the anterior left frontal lobe (axial series 4, image 25). This is favored to represent a small calcification as opposed to subarachnoid hemorrhage. There are bilateral hyperdensities in the CP angles favored to represent artifact. Vascular: No hyperdense vessel or unexpected calcification. Skull: There is extensive left-sided scalp swelling with pockets of subcutaneous gas and a large scalp laceration. There is no acute displaced calvarial fracture. Sinuses/Orbits: No acute finding. Other: None. CT CERVICAL FINDINGS Alignment: Normal. Skull base and vertebrae: No acute fracture. No primary bone lesion or focal pathologic process. Soft tissues and spinal canal: No prevertebral fluid or swelling. No visible canal hematoma. Disc levels:  Mild disc height loss is noted in the cervical spine. Other: None. CT CHEST FINDINGS Cardiovascular: The heart size is unremarkable. There is a well-positioned right-sided Port-A-Cath in place. There is no evidence for thoracic aortic dissection or aneurysm. There is no large centrally located pulmonary embolism. No significant pericardial effusion. Mediastinum/Nodes: --No mediastinal or hilar lymphadenopathy. --No axillary lymphadenopathy. --No supraclavicular lymphadenopathy. --Normal thyroid gland. --The esophagus is unremarkable Lungs/Pleura: No pulmonary nodules or masses. No pleural effusion or pneumothorax. No focal airspace consolidation. No focal pleural abnormality. Musculoskeletal: There is no evidence for rib fracture. There is an acute fracture of the T9 vertebral body. This fracture extends superiorly through both endplates and posteriorly into the posterior elements including the bilateral lamina. There is no significant retropulsion at this level. CT ABDOMEN AND PELVIS FINDINGS Hepatobiliary: The liver is normal. Normal  gallbladder.There is no biliary ductal dilation. Pancreas: Normal contours without ductal dilatation. No peripancreatic fluid collection. Spleen: Unremarkable. Adrenals/Urinary Tract: --Adrenal glands: Unremarkable. --Right kidney/ureter: No hydronephrosis or radiopaque kidney stones. --Left kidney/ureter: No hydronephrosis or radiopaque kidney stones. --Urinary bladder: Unremarkable. Stomach/Bowel: --Stomach/Duodenum: No hiatal hernia or other gastric abnormality. Normal duodenal course and caliber. --Small bowel: Unremarkable. --Colon: There is scattered colonic diverticula without CT evidence for diverticulitis. --Appendix: Normal. Vascular/Lymphatic: Atherosclerotic calcification is present within the non-aneurysmal abdominal aorta, without hemodynamically significant stenosis. --No retroperitoneal lymphadenopathy. --No mesenteric lymphadenopathy. --No pelvic or inguinal lymphadenopathy. Reproductive: Unremarkable Other: No ascites or free air. There is a small fat containing umbilical hernia. Musculoskeletal. There is severe disc height loss at the L5-S1 level. There is no acute lumbar spine fracture. IMPRESSION: 1. Extensive left-sided scalp swelling with pockets of subcutaneous gas and a large scalp laceration. No acute displaced calvarial fracture. 2. No acute intracranial abnormality. 3. No acute fracture or malalignment of the cervical spine. 4. Acute T9 fracture involving the posterior elements consistent with a posterior tension band disruption. 5. No acute solid organ injury within the chest, abdomen, or pelvis. Aortic Atherosclerosis (ICD10-I70.0). Electronically Signed   By: Constance Holster M.D.   On: 07/02/2019 15:46   CT CERVICAL SPINE WO CONTRAST  Result Date: 07/02/2019 CLINICAL DATA:  Pain status post fall from ladder. Laceration and bleeding to the back of the head. EXAM: CT HEAD WITHOUT CONTRAST CT CERVICAL SPINE WITHOUT CONTRAST CT CHEST, ABDOMEN AND PELVIS WITHOUT CONTRAST TECHNIQUE:  Contiguous axial images were obtained from the base of the skull through the vertex without intravenous contrast. Multidetector CT imaging of the cervical spine was performed without intravenous contrast. Multiplanar CT image reconstructions were also generated. Multidetector CT imaging of the chest, abdomen and pelvis was performed following the standard protocol without IV contrast. COMPARISON:  CT of the pelvis dated October 22, 2018. PET-CT dated January 06, 2018. FINDINGS: CT HEAD FINDINGS Brain: No evidence of acute infarction, hemorrhage, hydrocephalus, extra-axial collection or mass lesion/mass effect. There  is a small punctate density in the anterior left frontal lobe (axial series 4, image 25). This is favored to represent a small calcification as opposed to subarachnoid hemorrhage. There are bilateral hyperdensities in the CP angles favored to represent artifact. Vascular: No hyperdense vessel or unexpected calcification. Skull: There is extensive left-sided scalp swelling with pockets of subcutaneous gas and a large scalp laceration. There is no acute displaced calvarial fracture. Sinuses/Orbits: No acute finding. Other: None. CT CERVICAL FINDINGS Alignment: Normal. Skull base and vertebrae: No acute fracture. No primary bone lesion or focal pathologic process. Soft tissues and spinal canal: No prevertebral fluid or swelling. No visible canal hematoma. Disc levels:  Mild disc height loss is noted in the cervical spine. Other: None. CT CHEST FINDINGS Cardiovascular: The heart size is unremarkable. There is a well-positioned right-sided Port-A-Cath in place. There is no evidence for thoracic aortic dissection or aneurysm. There is no large centrally located pulmonary embolism. No significant pericardial effusion. Mediastinum/Nodes: --No mediastinal or hilar lymphadenopathy. --No axillary lymphadenopathy. --No supraclavicular lymphadenopathy. --Normal thyroid gland. --The esophagus is unremarkable  Lungs/Pleura: No pulmonary nodules or masses. No pleural effusion or pneumothorax. No focal airspace consolidation. No focal pleural abnormality. Musculoskeletal: There is no evidence for rib fracture. There is an acute fracture of the T9 vertebral body. This fracture extends superiorly through both endplates and posteriorly into the posterior elements including the bilateral lamina. There is no significant retropulsion at this level. CT ABDOMEN AND PELVIS FINDINGS Hepatobiliary: The liver is normal. Normal gallbladder.There is no biliary ductal dilation. Pancreas: Normal contours without ductal dilatation. No peripancreatic fluid collection. Spleen: Unremarkable. Adrenals/Urinary Tract: --Adrenal glands: Unremarkable. --Right kidney/ureter: No hydronephrosis or radiopaque kidney stones. --Left kidney/ureter: No hydronephrosis or radiopaque kidney stones. --Urinary bladder: Unremarkable. Stomach/Bowel: --Stomach/Duodenum: No hiatal hernia or other gastric abnormality. Normal duodenal course and caliber. --Small bowel: Unremarkable. --Colon: There is scattered colonic diverticula without CT evidence for diverticulitis. --Appendix: Normal. Vascular/Lymphatic: Atherosclerotic calcification is present within the non-aneurysmal abdominal aorta, without hemodynamically significant stenosis. --No retroperitoneal lymphadenopathy. --No mesenteric lymphadenopathy. --No pelvic or inguinal lymphadenopathy. Reproductive: Unremarkable Other: No ascites or free air. There is a small fat containing umbilical hernia. Musculoskeletal. There is severe disc height loss at the L5-S1 level. There is no acute lumbar spine fracture. IMPRESSION: 1. Extensive left-sided scalp swelling with pockets of subcutaneous gas and a large scalp laceration. No acute displaced calvarial fracture. 2. No acute intracranial abnormality. 3. No acute fracture or malalignment of the cervical spine. 4. Acute T9 fracture involving the posterior elements  consistent with a posterior tension band disruption. 5. No acute solid organ injury within the chest, abdomen, or pelvis. Aortic Atherosclerosis (ICD10-I70.0). Electronically Signed   By: Constance Holster M.D.   On: 07/02/2019 15:46   CT ABDOMEN PELVIS W CONTRAST  Result Date: 07/02/2019 CLINICAL DATA:  Pain status post fall from ladder. Laceration and bleeding to the back of the head. EXAM: CT HEAD WITHOUT CONTRAST CT CERVICAL SPINE WITHOUT CONTRAST CT CHEST, ABDOMEN AND PELVIS WITHOUT CONTRAST TECHNIQUE: Contiguous axial images were obtained from the base of the skull through the vertex without intravenous contrast. Multidetector CT imaging of the cervical spine was performed without intravenous contrast. Multiplanar CT image reconstructions were also generated. Multidetector CT imaging of the chest, abdomen and pelvis was performed following the standard protocol without IV contrast. COMPARISON:  CT of the pelvis dated October 22, 2018. PET-CT dated January 06, 2018. FINDINGS: CT HEAD FINDINGS Brain: No evidence of acute infarction, hemorrhage, hydrocephalus, extra-axial  collection or mass lesion/mass effect. There is a small punctate density in the anterior left frontal lobe (axial series 4, image 25). This is favored to represent a small calcification as opposed to subarachnoid hemorrhage. There are bilateral hyperdensities in the CP angles favored to represent artifact. Vascular: No hyperdense vessel or unexpected calcification. Skull: There is extensive left-sided scalp swelling with pockets of subcutaneous gas and a large scalp laceration. There is no acute displaced calvarial fracture. Sinuses/Orbits: No acute finding. Other: None. CT CERVICAL FINDINGS Alignment: Normal. Skull base and vertebrae: No acute fracture. No primary bone lesion or focal pathologic process. Soft tissues and spinal canal: No prevertebral fluid or swelling. No visible canal hematoma. Disc levels:  Mild disc height loss is noted  in the cervical spine. Other: None. CT CHEST FINDINGS Cardiovascular: The heart size is unremarkable. There is a well-positioned right-sided Port-A-Cath in place. There is no evidence for thoracic aortic dissection or aneurysm. There is no large centrally located pulmonary embolism. No significant pericardial effusion. Mediastinum/Nodes: --No mediastinal or hilar lymphadenopathy. --No axillary lymphadenopathy. --No supraclavicular lymphadenopathy. --Normal thyroid gland. --The esophagus is unremarkable Lungs/Pleura: No pulmonary nodules or masses. No pleural effusion or pneumothorax. No focal airspace consolidation. No focal pleural abnormality. Musculoskeletal: There is no evidence for rib fracture. There is an acute fracture of the T9 vertebral body. This fracture extends superiorly through both endplates and posteriorly into the posterior elements including the bilateral lamina. There is no significant retropulsion at this level. CT ABDOMEN AND PELVIS FINDINGS Hepatobiliary: The liver is normal. Normal gallbladder.There is no biliary ductal dilation. Pancreas: Normal contours without ductal dilatation. No peripancreatic fluid collection. Spleen: Unremarkable. Adrenals/Urinary Tract: --Adrenal glands: Unremarkable. --Right kidney/ureter: No hydronephrosis or radiopaque kidney stones. --Left kidney/ureter: No hydronephrosis or radiopaque kidney stones. --Urinary bladder: Unremarkable. Stomach/Bowel: --Stomach/Duodenum: No hiatal hernia or other gastric abnormality. Normal duodenal course and caliber. --Small bowel: Unremarkable. --Colon: There is scattered colonic diverticula without CT evidence for diverticulitis. --Appendix: Normal. Vascular/Lymphatic: Atherosclerotic calcification is present within the non-aneurysmal abdominal aorta, without hemodynamically significant stenosis. --No retroperitoneal lymphadenopathy. --No mesenteric lymphadenopathy. --No pelvic or inguinal lymphadenopathy. Reproductive:  Unremarkable Other: No ascites or free air. There is a small fat containing umbilical hernia. Musculoskeletal. There is severe disc height loss at the L5-S1 level. There is no acute lumbar spine fracture. IMPRESSION: 1. Extensive left-sided scalp swelling with pockets of subcutaneous gas and a large scalp laceration. No acute displaced calvarial fracture. 2. No acute intracranial abnormality. 3. No acute fracture or malalignment of the cervical spine. 4. Acute T9 fracture involving the posterior elements consistent with a posterior tension band disruption. 5. No acute solid organ injury within the chest, abdomen, or pelvis. Aortic Atherosclerosis (ICD10-I70.0). Electronically Signed   By: Constance Holster M.D.   On: 07/02/2019 15:46    Procedures Procedures (including critical care time)  Medications Ordered in ED Medications  lidocaine-EPINEPHrine (XYLOCAINE W/EPI) 2 %-1:200000 (PF) injection 20 mL (has no administration in time range)  Tdap (BOOSTRIX) injection 0.5 mL (0.5 mLs Intramuscular Given 07/02/19 1407)  fentaNYL (SUBLIMAZE) injection 50 mcg (50 mcg Intravenous Given 07/02/19 1355)  iohexol (OMNIPAQUE) 300 MG/ML solution 100 mL (100 mLs Intravenous Contrast Given 07/02/19 1516)    ED Course  I have reviewed the triage vital signs and the nursing notes.  Pertinent labs & imaging results that were available during my care of the patient were reviewed by me and considered in my medical decision making (see chart for details).    MDM Rules/Calculators/A&P  Patient is a 69 year old female who fell off of a ladder going to the attic.  She had a brief loss of consciousness.  She is neurologically intact.  She has evidence of a T9 vertebral body fracture.  She has an extensive scalp laceration/degloving injury.  I spoke with the ENT, Dr. Benjamine Mola, who will come in and repair the degloving injury.  I have a page out to neurosurgery regarding the T9 fracture.  Dr. Sabra Heck to take  over care pending the consult from neurosurgery.   Final Clinical Impression(s) / ED Diagnoses Final diagnoses:  Laceration of scalp, initial encounter  Fall, initial encounter  Closed fracture of ninth thoracic vertebra, unspecified fracture morphology, initial encounter [facility])    Rx / [place] Orders ED Discharge Orders    None       Malvin Johns, MD 07/02/19 1604

## 2019-07-02 NOTE — ED Notes (Signed)
Suture cart and suture tray placed at pt bedside

## 2019-07-02 NOTE — Progress Notes (Signed)
Spoke with Ortho tech in regard to placing brace on patient. Ortho tech stated that brace would be applied to patient in the am due to patient not having to get up out of bed through the night. Purewick applied to patient and will use bedpan as needed.

## 2019-07-02 NOTE — ED Notes (Signed)
Manual bp: 116/78

## 2019-07-02 NOTE — Progress Notes (Signed)
Patient admitted to unit from ED. Report received from [facility]. VS WNL. Complaints of head pain from laceration, prn dilaudid given. IVF started. Patient alert and oriented x4, will continue to monitor.

## 2019-07-02 NOTE — H&P (Signed)
Rachel [name] is an 69 y.o. female.   Chief Complaint: back pain HPI: 69 year old female was walking down the pulldown stairs from the attic.  She got to the final 3 or 4 steps and fell down.  She struck her head and noted bleeding from a laceration.  She was able to crawl to the phone and call EMS.  She was brought in as a nontrauma code activation and underwent evaluation in the emergency department.  She was found of a large scalp laceration which was repaired by Dr. Sabra Heck.  She was also found to have a T9 fracture and Dr. Marcello Moores was consulted for that.  I was asked to see her for admission.  She complains of back pain.  She reports that she is currently undergoing treatment for CLL by Dr. Jana Hakim at the cancer center.  Past Medical History:  Diagnosis Date  . 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 Diverticulitis   . Leukemia ([place])   . Lower back pain     Past Surgical History:  Procedure Laterality Date  . BREAST SURGERY  2015   left breast bx  . BUNIONECTOMY     right foot  . lining of uterus removed    . OTHER SURGICAL HISTORY     removal of uterine lining  . TONSILLECTOMY      Family History  Problem Relation Age of Onset  . Depression Son    Social History:  reports that she has never smoked. She has never used smokeless tobacco. She reports current alcohol use. She reports that she does not use drugs.  Allergies:  Allergies  Allergen Reactions  . Penicillins Anaphylaxis    "stopped breathing" "drops my heartbeat" "I just pass out"    (Not in a hospital admission)   Results for orders placed or performed during the hospital encounter of 07/02/19 (from the past 48 hour(s))  Sample to Blood Bank     Status: None   Collection Time: 07/02/19  1:45 PM  Result Value Ref Range   Blood Bank Specimen SAMPLE AVAILABLE FOR TESTING    Sample Expiration      07/03/2019,2359 Performed at [facility] [address], [place], [place] [address]   I-Stat Chem  8, ED     Status: Abnormal   Collection Time: 07/02/19  1:48 PM  Result Value Ref Range   Sodium 142 135 - 145 mmol/L   Potassium 4.3 3.5 - 5.1 mmol/L   Chloride 110 98 - 111 mmol/L   BUN 15 8 - 23 mg/dL   Creatinine, Ser 0.80 0.44 - 1.00 mg/dL   Glucose, Bld 105 (H) 70 - 99 mg/dL    Comment: Glucose reference range applies only to samples taken after fasting for at least 8 hours.   Calcium, Ion 1.01 (L) 1.15 - 1.40 mmol/L   TCO2 24 22 - 32 mmol/L   Hemoglobin 12.2 12.0 - 15.0 g/dL   HCT 36.0 36.0 - 46.0 %  Comprehensive metabolic panel     Status: Abnormal   Collection Time: 07/02/19  1:49 PM  Result Value Ref Range   Sodium 142 135 - 145 mmol/L   Potassium 3.9 3.5 - 5.1 mmol/L   Chloride 105 98 - 111 mmol/L   CO2 25 22 - 32 mmol/L   Glucose, Bld 126 (H) 70 - 99 mg/dL    Comment: Glucose reference range applies only to samples taken after fasting for at least 8 hours.   BUN 12 8 -  23 mg/dL   Creatinine, Ser 1.12 (H) 0.44 - 1.00 mg/dL   Calcium 9.3 8.9 - 10.3 mg/dL   Total Protein 6.2 (L) 6.5 - 8.1 g/dL   Albumin 3.8 3.5 - 5.0 g/dL   AST 49 (H) 15 - 41 U/L   ALT 48 (H) 0 - 44 U/L   Alkaline Phosphatase 106 38 - 126 U/L   Total Bilirubin 0.7 0.3 - 1.2 mg/dL   GFR calc non Af Amer 50 (L) >60 mL/min   GFR calc Af Amer 58 (L) >60 mL/min   Anion gap 12 5 - 15    Comment: Performed at [facility] [address], [place], [place] [address]  CBC     Status: Abnormal   Collection Time: 07/02/19  1:49 PM  Result Value Ref Range   WBC 5.8 4.0 - 10.5 K/uL   RBC 5.06 3.87 - 5.11 MIL/uL   Hemoglobin 14.3 12.0 - 15.0 g/dL   HCT 44.6 36.0 - 46.0 %   MCV 88.1 80.0 - 100.0 fL   MCH 28.3 26.0 - 34.0 pg   MCHC 32.1 30.0 - 36.0 g/dL   RDW 13.1 11.5 - 15.5 %   Platelets 123 (L) 150 - 400 K/uL    Comment: SPECIMEN CHECKED FOR CLOTS REPEATED TO VERIFY    nRBC 0.0 0.0 - 0.2 %    Comment: Performed at [facility], [place] [address], [place], [place] [address]  Ethanol      Status: None   Collection Time: 07/02/19  1:49 PM  Result Value Ref Range   Alcohol, Ethyl (B) <10 <10 mg/dL    Comment: (NOTE) Lowest detectable limit for serum alcohol is 10 mg/dL. For medical purposes only. Performed at [facility], [place] [address] [place], [place] [address]   Lactic acid, plasma     Status: None   Collection Time: 07/02/19  1:49 PM  Result Value Ref Range   Lactic Acid, Venous 1.7 0.5 - 1.9 mmol/L    Comment: Performed at [facility] [address] [place], [place] [address]  Protime-INR     Status: None   Collection Time: 07/02/19  1:49 PM  Result Value Ref Range   Prothrombin Time 12.1 11.4 - 15.2 seconds   INR 0.9 0.8 - 1.2    Comment: (NOTE) INR goal varies based on device and disease states. Performed at [facility], [place] [address] [place], [place] [address]   Urinalysis, Routine w reflex microscopic     Status: Abnormal   Collection Time: 07/02/19  4:28 PM  Result Value Ref Range   Color, Urine YELLOW YELLOW   APPearance CLEAR CLEAR   Specific Gravity, Urine >1.046 (H) 1.005 - 1.030   pH 7.0 5.0 - 8.0   Glucose, UA NEGATIVE NEGATIVE mg/dL   Hgb urine dipstick SMALL (A) NEGATIVE   Bilirubin Urine NEGATIVE NEGATIVE   Ketones, ur 5 (A) NEGATIVE mg/dL   Protein, ur NEGATIVE NEGATIVE mg/dL   Nitrite NEGATIVE NEGATIVE   Leukocytes,Ua NEGATIVE NEGATIVE   RBC / HPF 0-5 0 - 5 RBC/hpf   WBC, UA 0-5 0 - 5 WBC/hpf   Bacteria, UA NONE SEEN NONE SEEN   Squamous Epithelial / LPF 0-5 0 - 5   Mucus PRESENT     Comment: Performed at [facility], [place] [address] [place], [place] [address]   CT HEAD WO CONTRAST  Result Date: 07/02/2019 CLINICAL DATA:  Pain  status post fall from ladder. Laceration and bleeding to the back of the head. EXAM: CT HEAD WITHOUT CONTRAST CT CERVICAL SPINE WITHOUT CONTRAST CT CHEST, ABDOMEN AND PELVIS WITHOUT CONTRAST TECHNIQUE: Contiguous axial images were obtained from the base of the skull through the  vertex without intravenous contrast. Multidetector CT imaging of the cervical spine was performed without intravenous contrast. Multiplanar CT image reconstructions were also generated. Multidetector CT imaging of the chest, abdomen and pelvis was performed following the standard protocol without IV contrast. COMPARISON:  CT of the pelvis dated October 22, 2018. PET-CT dated January 06, 2018. FINDINGS: CT HEAD FINDINGS Brain: No evidence of acute infarction, hemorrhage, hydrocephalus, extra-axial collection or mass lesion/mass effect. There is a small punctate density in the anterior left frontal lobe (axial series 4, image 25). This is favored to represent a small calcification as opposed to subarachnoid hemorrhage. There are bilateral hyperdensities in the CP angles favored to represent artifact. Vascular: No hyperdense vessel or unexpected calcification. Skull: There is extensive left-sided scalp swelling with pockets of subcutaneous gas and a large scalp laceration. There is no acute displaced calvarial fracture. Sinuses/Orbits: No acute finding. Other: None. CT CERVICAL FINDINGS Alignment: Normal. Skull base and vertebrae: No acute fracture. No primary bone lesion or focal pathologic process. Soft tissues and spinal canal: No prevertebral fluid or swelling. No visible canal hematoma. Disc levels:  Mild disc height loss is noted in the cervical spine. Other: None. CT CHEST FINDINGS Cardiovascular: The heart size is unremarkable. There is a well-positioned right-sided Port-A-Cath in place. There is no evidence for thoracic aortic dissection or aneurysm. There is no large centrally located pulmonary embolism. No significant pericardial effusion. Mediastinum/Nodes: --No mediastinal or hilar lymphadenopathy. --No axillary lymphadenopathy. --No supraclavicular lymphadenopathy. --Normal thyroid gland. --The esophagus is unremarkable Lungs/Pleura: No pulmonary nodules or masses. No pleural effusion or pneumothorax. No  focal airspace consolidation. No focal pleural abnormality. Musculoskeletal: There is no evidence for rib fracture. There is an acute fracture of the T9 vertebral body. This fracture extends superiorly through both endplates and posteriorly into the posterior elements including the bilateral lamina. There is no significant retropulsion at this level. CT ABDOMEN AND PELVIS FINDINGS Hepatobiliary: The liver is normal. Normal gallbladder.There is no biliary ductal dilation. Pancreas: Normal contours without ductal dilatation. No peripancreatic fluid collection. Spleen: Unremarkable. Adrenals/Urinary Tract: --Adrenal glands: Unremarkable. --Right kidney/ureter: No hydronephrosis or radiopaque kidney stones. --Left kidney/ureter: No hydronephrosis or radiopaque kidney stones. --Urinary bladder: Unremarkable. Stomach/Bowel: --Stomach/Duodenum: No hiatal hernia or other gastric abnormality. Normal duodenal course and caliber. --Small bowel: Unremarkable. --Colon: There is scattered colonic diverticula without CT evidence for diverticulitis. --Appendix: Normal. Vascular/Lymphatic: Atherosclerotic calcification is present within the non-aneurysmal abdominal aorta, without hemodynamically significant stenosis. --No retroperitoneal lymphadenopathy. --No mesenteric lymphadenopathy. --No pelvic or inguinal lymphadenopathy. Reproductive: Unremarkable Other: No ascites or free air. There is a small fat containing umbilical hernia. Musculoskeletal. There is severe disc height loss at the L5-S1 level. There is no acute lumbar spine fracture. IMPRESSION: 1. Extensive left-sided scalp swelling with pockets of subcutaneous gas and a large scalp laceration. No acute displaced calvarial fracture. 2. No acute intracranial abnormality. 3. No acute fracture or malalignment of the cervical spine. 4. Acute T9 fracture involving the posterior elements consistent with a posterior tension band disruption. 5. No acute solid organ injury within  the chest, abdomen, or pelvis. Aortic Atherosclerosis (ICD10-I70.0). Electronically Signed   By: Constance Holster M.D.   On: 07/02/2019 15:46   CT CHEST W CONTRAST  Result  Date: 07/02/2019 CLINICAL DATA:  Pain status post fall from ladder. Laceration and bleeding to the back of the head. EXAM: CT HEAD WITHOUT CONTRAST CT CERVICAL SPINE WITHOUT CONTRAST CT CHEST, ABDOMEN AND PELVIS WITHOUT CONTRAST TECHNIQUE: Contiguous axial images were obtained from the base of the skull through the vertex without intravenous contrast. Multidetector CT imaging of the cervical spine was performed without intravenous contrast. Multiplanar CT image reconstructions were also generated. Multidetector CT imaging of the chest, abdomen and pelvis was performed following the standard protocol without IV contrast. COMPARISON:  CT of the pelvis dated October 22, 2018. PET-CT dated January 06, 2018. FINDINGS: CT HEAD FINDINGS Brain: No evidence of acute infarction, hemorrhage, hydrocephalus, extra-axial collection or mass lesion/mass effect. There is a small punctate density in the anterior left frontal lobe (axial series 4, image 25). This is favored to represent a small calcification as opposed to subarachnoid hemorrhage. There are bilateral hyperdensities in the CP angles favored to represent artifact. Vascular: No hyperdense vessel or unexpected calcification. Skull: There is extensive left-sided scalp swelling with pockets of subcutaneous gas and a large scalp laceration. There is no acute displaced calvarial fracture. Sinuses/Orbits: No acute finding. Other: None. CT CERVICAL FINDINGS Alignment: Normal. Skull base and vertebrae: No acute fracture. No primary bone lesion or focal pathologic process. Soft tissues and spinal canal: No prevertebral fluid or swelling. No visible canal hematoma. Disc levels:  Mild disc height loss is noted in the cervical spine. Other: None. CT CHEST FINDINGS Cardiovascular: The heart size is  unremarkable. There is a well-positioned right-sided Port-A-Cath in place. There is no evidence for thoracic aortic dissection or aneurysm. There is no large centrally located pulmonary embolism. No significant pericardial effusion. Mediastinum/Nodes: --No mediastinal or hilar lymphadenopathy. --No axillary lymphadenopathy. --No supraclavicular lymphadenopathy. --Normal thyroid gland. --The esophagus is unremarkable Lungs/Pleura: No pulmonary nodules or masses. No pleural effusion or pneumothorax. No focal airspace consolidation. No focal pleural abnormality. Musculoskeletal: There is no evidence for rib fracture. There is an acute fracture of the T9 vertebral body. This fracture extends superiorly through both endplates and posteriorly into the posterior elements including the bilateral lamina. There is no significant retropulsion at this level. CT ABDOMEN AND PELVIS FINDINGS Hepatobiliary: The liver is normal. Normal gallbladder.There is no biliary ductal dilation. Pancreas: Normal contours without ductal dilatation. No peripancreatic fluid collection. Spleen: Unremarkable. Adrenals/Urinary Tract: --Adrenal glands: Unremarkable. --Right kidney/ureter: No hydronephrosis or radiopaque kidney stones. --Left kidney/ureter: No hydronephrosis or radiopaque kidney stones. --Urinary bladder: Unremarkable. Stomach/Bowel: --Stomach/Duodenum: No hiatal hernia or other gastric abnormality. Normal duodenal course and caliber. --Small bowel: Unremarkable. --Colon: There is scattered colonic diverticula without CT evidence for diverticulitis. --Appendix: Normal. Vascular/Lymphatic: Atherosclerotic calcification is present within the non-aneurysmal abdominal aorta, without hemodynamically significant stenosis. --No retroperitoneal lymphadenopathy. --No mesenteric lymphadenopathy. --No pelvic or inguinal lymphadenopathy. Reproductive: Unremarkable Other: No ascites or free air. There is a small fat containing umbilical hernia.  Musculoskeletal. There is severe disc height loss at the L5-S1 level. There is no acute lumbar spine fracture. IMPRESSION: 1. Extensive left-sided scalp swelling with pockets of subcutaneous gas and a large scalp laceration. No acute displaced calvarial fracture. 2. No acute intracranial abnormality. 3. No acute fracture or malalignment of the cervical spine. 4. Acute T9 fracture involving the posterior elements consistent with a posterior tension band disruption. 5. No acute solid organ injury within the chest, abdomen, or pelvis. Aortic Atherosclerosis (ICD10-I70.0). Electronically Signed   By: Constance Holster M.D.   On: 07/02/2019 15:46   CT  CERVICAL SPINE WO CONTRAST  Result Date: 07/02/2019 CLINICAL DATA:  Pain status post fall from ladder. Laceration and bleeding to the back of the head. EXAM: CT HEAD WITHOUT CONTRAST CT CERVICAL SPINE WITHOUT CONTRAST CT CHEST, ABDOMEN AND PELVIS WITHOUT CONTRAST TECHNIQUE: Contiguous axial images were obtained from the base of the skull through the vertex without intravenous contrast. Multidetector CT imaging of the cervical spine was performed without intravenous contrast. Multiplanar CT image reconstructions were also generated. Multidetector CT imaging of the chest, abdomen and pelvis was performed following the standard protocol without IV contrast. COMPARISON:  CT of the pelvis dated October 22, 2018. PET-CT dated January 06, 2018. FINDINGS: CT HEAD FINDINGS Brain: No evidence of acute infarction, hemorrhage, hydrocephalus, extra-axial collection or mass lesion/mass effect. There is a small punctate density in the anterior left frontal lobe (axial series 4, image 25). This is favored to represent a small calcification as opposed to subarachnoid hemorrhage. There are bilateral hyperdensities in the CP angles favored to represent artifact. Vascular: No hyperdense vessel or unexpected calcification. Skull: There is extensive left-sided scalp swelling with pockets of  subcutaneous gas and a large scalp laceration. There is no acute displaced calvarial fracture. Sinuses/Orbits: No acute finding. Other: None. CT CERVICAL FINDINGS Alignment: Normal. Skull base and vertebrae: No acute fracture. No primary bone lesion or focal pathologic process. Soft tissues and spinal canal: No prevertebral fluid or swelling. No visible canal hematoma. Disc levels:  Mild disc height loss is noted in the cervical spine. Other: None. CT CHEST FINDINGS Cardiovascular: The heart size is unremarkable. There is a well-positioned right-sided Port-A-Cath in place. There is no evidence for thoracic aortic dissection or aneurysm. There is no large centrally located pulmonary embolism. No significant pericardial effusion. Mediastinum/Nodes: --No mediastinal or hilar lymphadenopathy. --No axillary lymphadenopathy. --No supraclavicular lymphadenopathy. --Normal thyroid gland. --The esophagus is unremarkable Lungs/Pleura: No pulmonary nodules or masses. No pleural effusion or pneumothorax. No focal airspace consolidation. No focal pleural abnormality. Musculoskeletal: There is no evidence for rib fracture. There is an acute fracture of the T9 vertebral body. This fracture extends superiorly through both endplates and posteriorly into the posterior elements including the bilateral lamina. There is no significant retropulsion at this level. CT ABDOMEN AND PELVIS FINDINGS Hepatobiliary: The liver is normal. Normal gallbladder.There is no biliary ductal dilation. Pancreas: Normal contours without ductal dilatation. No peripancreatic fluid collection. Spleen: Unremarkable. Adrenals/Urinary Tract: --Adrenal glands: Unremarkable. --Right kidney/ureter: No hydronephrosis or radiopaque kidney stones. --Left kidney/ureter: No hydronephrosis or radiopaque kidney stones. --Urinary bladder: Unremarkable. Stomach/Bowel: --Stomach/Duodenum: No hiatal hernia or other gastric abnormality. Normal duodenal course and caliber.  --Small bowel: Unremarkable. --Colon: There is scattered colonic diverticula without CT evidence for diverticulitis. --Appendix: Normal. Vascular/Lymphatic: Atherosclerotic calcification is present within the non-aneurysmal abdominal aorta, without hemodynamically significant stenosis. --No retroperitoneal lymphadenopathy. --No mesenteric lymphadenopathy. --No pelvic or inguinal lymphadenopathy. Reproductive: Unremarkable Other: No ascites or free air. There is a small fat containing umbilical hernia. Musculoskeletal. There is severe disc height loss at the L5-S1 level. There is no acute lumbar spine fracture. IMPRESSION: 1. Extensive left-sided scalp swelling with pockets of subcutaneous gas and a large scalp laceration. No acute displaced calvarial fracture. 2. No acute intracranial abnormality. 3. No acute fracture or malalignment of the cervical spine. 4. Acute T9 fracture involving the posterior elements consistent with a posterior tension band disruption. 5. No acute solid organ injury within the chest, abdomen, or pelvis. Aortic Atherosclerosis (ICD10-I70.0). Electronically Signed   By: Jamie Kato.D.  On: 07/02/2019 15:46   MR THORACIC SPINE WO CONTRAST  Result Date: 07/02/2019 CLINICAL DATA:  Spine fracture.  Preop. EXAM: MRI THORACIC SPINE WITHOUT CONTRAST TECHNIQUE: Multiplanar, multisequence MR imaging of the thoracic spine was performed. No intravenous contrast was administered. COMPARISON:  None. FINDINGS: Alignment: Mild degenerative anterolisthesis at T1-2. No traumatic malalignment. Exaggerated thoracic kyphosis. Vertebrae: Marrow edema within the T9 body with height loss measuring 60% when compared to T8. Minimal posterior cortex buckling. There is anterior displacement of vertebral body. T4 superior endplate with mild depression and marrow edema. No evidence of bone lesion. Cord:  No evidence of injury.  No intrathecal collection. Paraspinal and other soft tissues: Paravertebral edema  at the level of T9 fracture. There is strain of intrinsic back muscles at the level of T9, extending inferiorly and leftward. Subcutaneous contusion and mild muscular strain also seen at the upper thoracic levels. Disc levels: No significant disc degeneration or degenerative impingement. IMPRESSION: 1. T9 body fracture with 60% height loss. 2. T4 superior endplate fracture with minimal depression. 3. Back muscle strain without major ligamentous disruption. Electronically Signed   By: Monte Fantasia M.D.   On: 07/02/2019 20:50   CT ABDOMEN PELVIS W CONTRAST  Result Date: 07/02/2019 CLINICAL DATA:  Pain status post fall from ladder. Laceration and bleeding to the back of the head. EXAM: CT HEAD WITHOUT CONTRAST CT CERVICAL SPINE WITHOUT CONTRAST CT CHEST, ABDOMEN AND PELVIS WITHOUT CONTRAST TECHNIQUE: Contiguous axial images were obtained from the base of the skull through the vertex without intravenous contrast. Multidetector CT imaging of the cervical spine was performed without intravenous contrast. Multiplanar CT image reconstructions were also generated. Multidetector CT imaging of the chest, abdomen and pelvis was performed following the standard protocol without IV contrast. COMPARISON:  CT of the pelvis dated October 22, 2018. PET-CT dated January 06, 2018. FINDINGS: CT HEAD FINDINGS Brain: No evidence of acute infarction, hemorrhage, hydrocephalus, extra-axial collection or mass lesion/mass effect. There is a small punctate density in the anterior left frontal lobe (axial series 4, image 25). This is favored to represent a small calcification as opposed to subarachnoid hemorrhage. There are bilateral hyperdensities in the CP angles favored to represent artifact. Vascular: No hyperdense vessel or unexpected calcification. Skull: There is extensive left-sided scalp swelling with pockets of subcutaneous gas and a large scalp laceration. There is no acute displaced calvarial fracture. Sinuses/Orbits: No acute  finding. Other: None. CT CERVICAL FINDINGS Alignment: Normal. Skull base and vertebrae: No acute fracture. No primary bone lesion or focal pathologic process. Soft tissues and spinal canal: No prevertebral fluid or swelling. No visible canal hematoma. Disc levels:  Mild disc height loss is noted in the cervical spine. Other: None. CT CHEST FINDINGS Cardiovascular: The heart size is unremarkable. There is a well-positioned right-sided Port-A-Cath in place. There is no evidence for thoracic aortic dissection or aneurysm. There is no large centrally located pulmonary embolism. No significant pericardial effusion. Mediastinum/Nodes: --No mediastinal or hilar lymphadenopathy. --No axillary lymphadenopathy. --No supraclavicular lymphadenopathy. --Normal thyroid gland. --The esophagus is unremarkable Lungs/Pleura: No pulmonary nodules or masses. No pleural effusion or pneumothorax. No focal airspace consolidation. No focal pleural abnormality. Musculoskeletal: There is no evidence for rib fracture. There is an acute fracture of the T9 vertebral body. This fracture extends superiorly through both endplates and posteriorly into the posterior elements including the bilateral lamina. There is no significant retropulsion at this level. CT ABDOMEN AND PELVIS FINDINGS Hepatobiliary: The liver is normal. Normal gallbladder.There is no biliary ductal  dilation. Pancreas: Normal contours without ductal dilatation. No peripancreatic fluid collection. Spleen: Unremarkable. Adrenals/Urinary Tract: --Adrenal glands: Unremarkable. --Right kidney/ureter: No hydronephrosis or radiopaque kidney stones. --Left kidney/ureter: No hydronephrosis or radiopaque kidney stones. --Urinary bladder: Unremarkable. Stomach/Bowel: --Stomach/Duodenum: No hiatal hernia or other gastric abnormality. Normal duodenal course and caliber. --Small bowel: Unremarkable. --Colon: There is scattered colonic diverticula without CT evidence for diverticulitis.  --Appendix: Normal. Vascular/Lymphatic: Atherosclerotic calcification is present within the non-aneurysmal abdominal aorta, without hemodynamically significant stenosis. --No retroperitoneal lymphadenopathy. --No mesenteric lymphadenopathy. --No pelvic or inguinal lymphadenopathy. Reproductive: Unremarkable Other: No ascites or free air. There is a small fat containing umbilical hernia. Musculoskeletal. There is severe disc height loss at the L5-S1 level. There is no acute lumbar spine fracture. IMPRESSION: 1. Extensive left-sided scalp swelling with pockets of subcutaneous gas and a large scalp laceration. No acute displaced calvarial fracture. 2. No acute intracranial abnormality. 3. No acute fracture or malalignment of the cervical spine. 4. Acute T9 fracture involving the posterior elements consistent with a posterior tension band disruption. 5. No acute solid organ injury within the chest, abdomen, or pelvis. Aortic Atherosclerosis (ICD10-I70.0). Electronically Signed   By: Constance Holster M.D.   On: 07/02/2019 15:46    Review of Systems  Constitutional: Negative.   HENT:       Pain at scalp laceration  Eyes: Negative.   Respiratory: Negative.   Cardiovascular: Negative.   Gastrointestinal: Negative.   Endocrine: Negative.   Genitourinary: Negative.   Musculoskeletal: Positive for back pain.  Skin: Negative.   Allergic/Immunologic: Negative.   Neurological: Negative for speech difficulty and weakness.  Hematological: Negative.   Psychiatric/Behavioral: Negative.     Blood pressure (!) 115/54, pulse 83, temperature 98.2 F (36.8 C), temperature source Oral, resp. rate 15, height 4\' 10"  (1.473 m), weight 53.5 kg, SpO2 95 %. Physical Exam  Constitutional: She is oriented to person, place, and time. She appears well-developed and well-nourished. No distress.  HENT:  Right Ear: External ear normal.  Left Ear: External ear normal.  Nose: Nose normal.  Mouth/Throat: Oropharynx is clear  and moist.  Scalp laceration is currently dressed  Eyes: Pupils are equal, round, and reactive to light. EOM are normal. Right eye exhibits no discharge. Left eye exhibits no discharge. No scleral icterus.  Neck: No tracheal deviation present. No thyromegaly present.  Nontender  Cardiovascular: Normal rate, regular rhythm, normal heart sounds and intact distal pulses.  No murmur heard. No thrill  Respiratory: Effort normal and breath sounds normal. No respiratory distress. She has no wheezes. She has no rales.  GI: Soft. She exhibits no distension. There is no abdominal tenderness. There is no rebound and no guarding.  No hepatosplenomegaly  Musculoskeletal:        General: No edema. Normal range of motion.     Cervical back: Neck supple.     Comments: Bullous reaction to several insect bites lower extremities  Neurological: She is alert and oriented to person, place, and time. She displays no atrophy and no tremor. No cranial nerve deficit. She exhibits normal muscle tone. She displays no seizure activity. GCS eye subscore is 4. GCS verbal subscore is 5. GCS motor subscore is 6.  Moves all extremities, sensation seems intact lower extremities  Skin: Skin is warm.  Psychiatric: She has a normal mood and affect.  A&O x3     Assessment/Plan Fall down pulldown attic stairs Large scalp laceration -repaired in the ED by Dr. Sabra Heck T4 and T9 fractures -TLSO per Dr.  Marcello Moores, consult pending CLL  Admit to inpatient for pain control, PT/OT. I also spoke with her husband Zenovia Jarred, MD 07/02/2019, 9:54 PM

## 2019-07-02 NOTE — ED Triage Notes (Signed)
Patient arrives via ems due to falling off her attic ladder. Per ems, the patient was climbing on the ladder when she fell off and hit her head on a unknown object or the ground. The patient was able to crawl to her phone and call ems.    Ems noted that patient had a large gash to her head on the scene. Patient arrives A&Ox4, with a 20g to her left FA. Head wrapped in a dry dressing and patient in a c-collar.   Patient is currently undergoing treatment for Leukemia as well.

## 2019-07-02 NOTE — Discharge Instructions (Addendum)
Spinal Compression Fracture  A spinal compression fracture is a collapse of the bones that form the spine (vertebrae). With this type of fracture, the vertebrae become pushed (compressed) into a wedge shape. Most compression fractures happen in the middle or lower part of the spine. What are the causes? This condition may be caused by:  Thinning and loss of density in the bones (osteoporosis). This is the most common cause.  A fall.  A car or motorcycle accident.  Cancer.  Trauma, such as a heavy, direct hit to the head or back. What increases the risk? You are more likely to develop this condition if:  You are 71 years or older.  You have osteoporosis.  You have certain types of cancer, including: ? Multiple myeloma. ? Lymphoma. ? Prostate cancer. ? Lung cancer. ? Breast cancer. What are the signs or symptoms? Symptoms of this condition include:  Severe pain.  Pain that gets worse over time.  Pain that is worse when you stand, walk, sit, or bend.  Sudden pain that is so bad that it is hard for you to move.  Bending or humping of the spine.  Gradual loss of height.  Numbness, tingling, or weakness in the back and legs.  Trouble walking. Your symptoms will depend on the cause of the fracture and how quickly it develops. How is this diagnosed? This condition may be diagnosed based on symptoms, medical history, and a physical exam. During the physical exam, your health care provider may tap along the length of your spine to check for tenderness. Tests may be done to confirm the diagnosis. They may include:  A bone mineral density test to check for osteoporosis.  Imaging tests, such as a spine X-ray, CT scan, or MRI. How is this treated? Treatment for this condition depends on the cause and severity of the condition. Some fractures may heal on their own with supportive care. Treatment may include:  Pain medicine.  Rest.  A back brace.  Physical therapy  exercises.  Medicine to strengthen bone.  Calcium and vitamin D supplements. Fractures that cause the back to become misshapen, cause nerve pain or weakness, or do not respond to other treatment may be treated with surgery. This may include:  Vertebroplasty. Bone cement is injected into the collapsed vertebrae to stabilize them.  Balloon kyphoplasty. The collapsed vertebrae are expanded with a balloon and then bone cement is injected into them.  Spinal fusion. The collapsed vertebrae are connected (fused) to normal vertebrae. Follow these instructions at home: Medicines  Take over-the-counter and prescription medicines only as told by your health care provider.  Do not drive or operate heavy machinery while taking prescription pain medicine.  If you are taking prescription pain medicine, take actions to prevent or treat constipation. Your health care provider may recommend that you: ? Drink enough fluid to keep your urine pale yellow. ? Eat foods that are high in fiber, such as fresh fruits and vegetables, whole grains, and beans. ? Limit foods that are high in fat and processed sugars, such as fried or sweet foods. ? Take an over-the-counter or prescription medicine for constipation. If you have a brace:  Wear the brace as told by your health care provider. Remove it only as told by your health care provider.  Loosen the brace if your fingers or toes tingle, become numb, or turn cold and blue.  Keep the brace clean.  If the brace is not waterproof: ? Do not let it get wet. ?  Cover it with a watertight covering when you take a bath or a shower. Managing pain, stiffness, and swelling   If directed, apply ice to the injured area: ? If you have a removable brace, remove it as told by your health care provider. ? Put ice in a plastic bag. ? Place a towel between your skin and the bag. ? Leave the ice on for 30 minutes every two hours at first. Then apply the ice as  needed. Activity  Rest as told by your health care provider. ? Avoid sitting for a long time without moving. Get up to take short walks every 1-2 hours. This is important to improve blood flow and breathing. Ask for help if you feel weak or unsteady.  Return to your normal activities as directed by your health care provider. Ask what activities are safe for you.  Do exercises to improve motion and strength in your back (physical therapy), as recommended by your health care provider.  Exercise regularly as directed by your health care provider. General instructions   Do not drink alcohol. Alcohol can interfere with your treatment.  Do not use any products that contain nicotine or tobacco, such as cigarettes and e-cigarettes. These can delay bone healing. If you need help quitting, ask your health care provider.  Keep all follow-up visits as told by your health care provider. This is important. It can help to prevent permanent injury, disability, and long-lasting (chronic) pain. Contact a health care provider if:  You have a fever.  You develop a cough that makes your pain worse.  Your pain medicine is not helping.  Your pain does not get better over time.  You cannot return to your normal activities as planned or expected. Get help right away if:  Your pain is very bad and it suddenly gets worse.  You are unable to move any body part (paralysis) that is below the level of your injury.  You have numbness, tingling, or weakness in any body part that is below the level of your injury.  You cannot control your bladder or bowels. Summary  A spinal compression fracture is a collapse of the bones that form the spine (vertebrae).  With this type of fracture, the vertebrae become pushed (compressed) into a wedge shape.  Your symptoms and treatment will depend on the cause and severity of the fracture and how quickly it develops.  Some fractures may heal on their own with  supportive care. Fractures that cause the back to become misshapen, cause nerve pain or weakness, or do not respond to other treatment may be treated with surgery. This information is not intended to replace advice given to you by your health care provider. Make sure you discuss any questions you have with your health care provider. Document Revised: 04/08/2018 Document Reviewed: 03/24/2017 Elsevier Patient Education  2020 [employer].   How to Use a Back Brace  A back brace is a form-fitting device that wraps around your trunk to support your lower back, abdomen, and hips. You may need to wear a back brace to relieve back pain or to correct a medical condition related to the back, such as abnormal curvature of the spine (scoliosis). A back brace can maintain or correct the shape of the spine and prevent a spinal problem from getting worse. A back brace can also take pressure off the layers of tissue (disks) between the bones of the spine (vertebrae). You may need a back brace to keep your back  and spine in place while you heal from an injury or recover from surgery. Back braces can be either plastic (rigid brace) or soft elastic (dynamic brace).  A rigid brace usually covers both the front and back of the entire upper body.  A soft brace may cover only the lower back and abdomen and may fasten with self-adhesive elastic straps. Your health care provider will recommend the proper brace for your needs and medical condition. What are the risks?  A back brace may not help if you do not wear it as directed by your health care provider. Be sure to wear the brace exactly as instructed in order to prevent further back problems.  Wearing the brace may be uncomfortable at first. You may have trouble sleeping with it on. It may also be hard for you to do certain activities while wearing it.  If the back brace is not worn as instructed, it may cause rubbing and pressure on your skin and may cause  sores. How to use a back brace Different types of braces will have different instructions for use. Follow instructions from your health care provider about:  How to put on the brace.  When and how often to wear the brace. In some cases, braces may need to be worn for long stretches of time. For example, a brace may need to be worn for 16-23 hours a day when used for scoliosis.  How to take off the brace.  Any safety tips you should follow when wearing the brace. This may include: ? Move carefully while wearing the brace. The brace restricts your movement and could lead to additional injuries. ? Use a cane or walker for support if you feel unsteady. ? Sit in high, firm chairs. It may be difficult to stand up from low, soft chairs. ? Check the skin around the brace every day. Tell your health care provider about any concerns. How to care for a back brace  Do not let the back brace get wet. Typically, you will remove the brace for bathing and then put it back on afterward.  If you have a rigid brace, be sure to store it in a safe place when you are not wearing it. This will help to prevent damage.  Clean or wash the back brace with mild soap and water as told by your health care provider. Contact a health care provider if:  Your brace gets damaged.  You have pain or discomfort when wearing the back brace.  Your back pain is getting worse or is not improving over time.  You notice redness or skin breakdown from wearing the brace. Summary  You may need to wear a back brace to relieve back pain or to correct a medical condition related to the back, such as abnormal curvature of the spine (scoliosis).  Follow instructions as told by your health care provider about how to use the brace and how to take care of it.  A back brace may not help if you do not wear it as directed by your health care provider. Wear the brace exactly as instructed in order to prevent further back  problems.  Move carefully while wearing the brace.  Contact a health care provider if you have pain or discomfort when wearing the back brace or your back pain is getting worse or is not improving over time. This information is not intended to replace advice given to you by your health care provider. Make sure you discuss any  questions you have with your health care provider. Document Revised: 01/06/2018 Document Reviewed: 01/06/2018 Elsevier Patient Education  2020 [employer].

## 2019-07-02 NOTE — ED Notes (Signed)
Pt to Ct

## 2019-07-03 ENCOUNTER — Inpatient Hospital Stay (HOSPITAL_COMMUNITY): Payer: Medicare Other

## 2019-07-03 LAB — CBC
HCT: 36.1 % (ref 36.0–46.0)
Hemoglobin: 11.6 g/dL — ABNORMAL LOW (ref 12.0–15.0)
MCH: 28.9 pg (ref 26.0–34.0)
MCHC: 32.1 g/dL (ref 30.0–36.0)
MCV: 90 fL (ref 80.0–100.0)
Platelets: 114 10*3/uL — ABNORMAL LOW (ref 150–400)
RBC: 4.01 MIL/uL (ref 3.87–5.11)
RDW: 13.2 % (ref 11.5–15.5)
WBC: 5.7 10*3/uL (ref 4.0–10.5)
nRBC: 0 % (ref 0.0–0.2)

## 2019-07-03 LAB — BASIC METABOLIC PANEL
Anion gap: 11 (ref 5–15)
BUN: 9 mg/dL (ref 8–23)
CO2: 22 mmol/L (ref 22–32)
Calcium: 8.4 mg/dL — ABNORMAL LOW (ref 8.9–10.3)
Chloride: 108 mmol/L (ref 98–111)
Creatinine, Ser: 0.8 mg/dL (ref 0.44–1.00)
GFR calc Af Amer: >60 mL/min (ref >60)
GFR calc non Af Amer: >60 mL/min (ref >60)
Glucose, Bld: 135 mg/dL — ABNORMAL HIGH (ref 70–99)
Potassium: 3.8 mmol/L (ref 3.5–5.1)
Sodium: 141 mmol/L (ref 135–145)

## 2019-07-03 LAB — HIV ANTIBODY (ROUTINE TESTING W REFLEX): HIV Screen 4th Generation wRfx: NONREACTIVE

## 2019-07-03 NOTE — Progress Notes (Signed)
PT TREATMENT NOTE  Pt seen for additional session due to request of NT to assist with transfer back to bed. Pt tolerating ~20 minutes up in chair before asking to return to bed due to lightheadedness. BP 92/63. Offered to recline pt, but she reports she "feels safer in the bed." Education provided on increasing amount of time upright each day to improve tolerance; pt verbalized understanding. Transferring back to bed with a walker and min guard assist. Able to participate in bed level exercises for strengthening and core stabilization. Will continue to progress as tolerated.    Wyona Almas, PT, DPT Acute Rehabilitation Services Pager [phone] Office [phone]     07/03/19 1526  PT Visit Information  Last PT Received On 07/03/19  Assistance Needed +1  History of Present Illness Pt is a 68 y/o female with PMHx including Chronic lymphoblastic leukemia, Diverticulitis, and Lower back pain who suffered a fall down attic stairs, suffering a large scalp laceration (repaired in ED), T4 andT9 fracture. Neurosx currently assessing need for surgery.   Subjective Data  Patient Stated Goal regain her independence   Precautions  Precautions Fall;Back  Precaution Booklet Issued Yes (comment)  Required Braces or Orthoses Spinal Brace  Spinal Brace TLSO (for OOB, per orders okay to remove to shower)  Restrictions  Weight Bearing Restrictions No  Pain Assessment  Pain Assessment Faces  Faces Pain Scale 4  Pain Location back  Pain Descriptors / Indicators Sore;Heaviness;Discomfort  Pain Intervention(s) Limited activity within patient's tolerance;Monitored during session;Repositioned  Cognition  Arousal/Alertness Awake/alert  Behavior During Therapy WFL for tasks assessed/performed  Overall Cognitive Status Within Functional Limits for tasks assessed  Bed Mobility  Overal bed mobility Needs Assistance  Bed Mobility Sit to Sidelying;Rolling  Rolling Min assist  Sidelying to sit Min  assist  General bed mobility comments MinA for elevation of legs back into bed. Cues for technique  Transfers  Overall transfer level Needs assistance  Equipment used Rolling walker (2 wheeled)  Transfers Sit to/from Stand  Sit to Stand Min guard  General transfer comment for safety and balance  Ambulation/Gait  Ambulation/Gait assistance Min guard  Gait Distance (Feet) 3 Feet  Assistive device Rolling walker (2 wheeled)  Gait Pattern/deviations Step-through pattern;Decreased stride length  General Gait Details Pivotal steps from bed to chair, cues for sequencing/direction  Gait velocity decreased  Gait velocity interpretation <1.8 ft/sec, indicate of risk for recurrent falls  Balance  Overall balance assessment Needs assistance  Sitting-balance support Feet supported  Sitting balance-Leahy Scale Fair  Sitting balance - Comments minguard due to dizziness with intial sitting  Standing balance support Bilateral upper extremity supported;During functional activity  Standing balance-Leahy Scale Fair  Standing balance comment close minguard for balance while washing hands  Exercises  Exercises General Lower Extremity;Other exercises;General Upper Extremity  General Exercises - Upper Extremity  Shoulder Flexion Both;10 reps;Supine  General Exercises - Lower Extremity  Heel Slides Both;10 reps;Supine  Straight Leg Raises Both;10 reps;Supine  Other Exercises  Other Exercises Supine: TA contractions with 5 second hold x 8  PT - End of Session  Equipment Utilized During Treatment Gait belt;Back brace  Activity Tolerance Patient tolerated treatment well  Patient left with call bell/phone within reach;in bed;with bed alarm set  Nurse Communication Mobility status   PT - Assessment/Plan  PT Plan Current plan remains appropriate  PT Visit Diagnosis Difficulty in walking, not elsewhere classified (R26.2);Pain  Pain - part of body  (back)  PT Frequency (ACUTE ONLY) Min 5X/week  Follow Up  Recommendations Home health PT;Supervision for mobility/OOB  PT equipment Rolling walker with 5" wheels;3in1 (PT)  AM-PAC PT "6 Clicks" Mobility Outcome Measure (Version 2)  Help needed turning from your back to your side while in a flat bed without using bedrails? 3  Help needed moving from lying on your back to sitting on the side of a flat bed without using bedrails? 3  Help needed moving to and from a bed to a chair (including a wheelchair)? 3  Help needed standing up from a chair using your arms (e.g., wheelchair or bedside chair)? 3  Help needed to walk in hospital room? 3  Help needed climbing 3-5 steps with a railing?  3  6 Click Score 18  Consider Recommendation of Discharge To: Home with [facility]  Acute Rehab PT Goals  PT Goal Formulation With patient  Time For Goal Achievement 07/17/19  Potential to Achieve Goals Good  PT Time Calculation  PT Start Time (ACUTE ONLY) 1420  PT Stop Time (ACUTE ONLY) 1434  PT Time Calculation (min) (ACUTE ONLY) 14 min  PT General Charges  $$ ACUTE PT VISIT 1 Visit  PT Treatments  $Therapeutic Exercise 8-22 mins

## 2019-07-03 NOTE — Consult Note (Signed)
Chief Complaint   Chief Complaint  Patient presents with  . Fall  . Head Injury    History of Present Illness  Rachel [name] is a 69 y.o. 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  No weakness, numbness.  Scalp repaired in the ER.  She is having moderate-to-severe back pain in bed.  Past Medical History   Past Medical History:  Diagnosis Date  . 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 Diverticulitis   . Leukemia ([place])   . Lower back pain     Past Surgical History   Past Surgical History:  Procedure Laterality Date  . BREAST SURGERY  2015   left breast bx  . BUNIONECTOMY     right foot  . lining of uterus removed    . OTHER SURGICAL HISTORY     removal of uterine lining  . TONSILLECTOMY      Social History   Social History   Tobacco Use  . Smoking status: Never Smoker  . Smokeless tobacco: Never Used  Substance Use Topics  . Alcohol use: Yes    Alcohol/week: 0.0 standard drinks    Comment: socially  . Drug use: No    Medications   Prior to Admission medications   Medication Sig Start Date End Date Taking? Authorizing Provider  acetaminophen (TYLENOL) 500 MG tablet Take 500-1,000 mg by mouth every 6 (six) hours as needed for headache.    Yes [provider]  acyclovir (ZOVIRAX) 400 MG tablet Take 1 tablet (400 mg total) by mouth 2 (two) times daily. 07/13/18  Yes Magrinat, Virgie Dad, MD  albuterol (VENTOLIN HFA) 108 (90 Base) MCG/ACT inhaler Inhale 1-2 puffs into the lungs every 6 (six) hours as needed for shortness of breath or wheezing. 06/08/19  Yes [provider]  allopurinol (ZYLOPRIM) 300 MG tablet TAKE 1 TABLET DAILY Patient taking differently: Take 300 mg by mouth daily.  06/14/19  Yes Magrinat, Virgie Dad, MD  azelastine (ASTELIN) 0.1 % nasal spray Place into both nostrils 2 (two) times daily. Use in each nostril as directed   Yes  [provider]  Calcium Citrate-Vitamin D 200-250 MG-UNIT TABS Take 1 tablet by mouth 4 (four) times daily.   Yes [provider]  Cholecalciferol (VITAMIN D-3) 125 MCG (5000 UT) TABS Take 2 tablets by mouth daily.   Yes [provider]  Estradiol (ELESTRIN) 0.52 MG/0.87 GM (0.06%) GEL APPLY 2 PUMPS THINLY TO UPPER ARM DAILY AT BEDTIME Patient taking differently: Apply 1 application topically at bedtime. APPLY 2 PUMPS THINLY TO UPPER ARM DAILY AT BEDTIME 04/04/19  Yes [name], MD  famotidine (PEPCID) 20 MG tablet Take 20 mg by mouth at bedtime.    Yes [provider]  Ibrutinib 280 MG TABS Take 280 mg by mouth daily. Take with water at approximately the same time each day 01/06/19  Yes Magrinat, Virgie Dad, MD  lidocaine-prilocaine (EMLA) cream Apply a nickel size amount to skin on top of port- cover with saran wrap at least 1 hour before access 05/07/16  Yes Magrinat, Virgie Dad, MD  Magnesium 200 MG TABS Take 2 tablets by mouth daily.   Yes [provider]  montelukast (SINGULAIR) 10 MG tablet Take 10 mg by mouth at bedtime.   Yes [provider]  progesterone (PROMETRIUM) 200 MG capsule Take 1 capsule (200 mg total) by mouth at bedtime. 04/04/19  Yes [name], MD  traZODone (DESYREL) 100 MG tablet Take 1 tablet (100 mg total) by mouth at bedtime. 12/31/18  Yes [name], MD  venetoclax 100 MG TABS Take 100 mg by mouth daily. Take with food and water at approximately the same time each day. 01/06/19  Yes Magrinat, Virgie Dad, MD  Zinc 30 MG TABS Take 1 tablet by mouth daily.   Yes [provider]  cholestyramine (QUESTRAN) 4 g packet [place] 1 PACKET(4 GRAMS) BY MOUTH DAILY Patient not taking: Reported on 07/03/2019 05/24/19   Magrinat, Virgie Dad, MD  prochlorperazine (COMPAZINE) 5 MG tablet TAKE 1 TO 2 TABLETS(5 TO 10 MG) BY MOUTH EVERY 6 HOURS AS NEEDED FOR NAUSEA OR VOMITING Patient not taking: Reported on  01/31/2019 01/06/19   Magrinat, Virgie Dad, MD    Allergies   Allergies  Allergen Reactions  . Penicillins Anaphylaxis    "stopped breathing" "drops my heartbeat" "I just pass out"    Review of Systems  ROS  Neurologic Exam  Awake, alert, oriented Memory and concentration grossly intact Speech fluent, appropriate CN grossly intact Motor exam: Upper Extremities Deltoid Bicep Tricep Grip  Right 5/5 5/5 5/5 5/5  Left 5/5 5/5 5/5 5/5   Lower Extremities IP Quad PF DF EHL  Right 5/5 5/5 5/5 5/5 5/5  Left 5/5 5/5 5/5 5/5 5/5   Sensation grossly intact to LT  Imaging  CT and MRI reviewed.  There is a moderate-to-severe T9 compression fracture with ~40% loss of height.  There is linear fracture through the superior lamina and bilateral pars but no disruption of the ligamentum flavum or interspinous ligament.  There is mild kyphosis.  Impression  - 69 y.o. female with T9 compression fracture and linear posterior element fracture but no disruption of PLC  Plan  - this bony fracture has a good propensity to heal on its own but if she has persistent severe pain, she would be a good candidate for T9 vertebral augmentation and possible percutaneous instrumentation for internal bracing which may help significantly with speed of recovery. - I would like to see how she does with mobilization with PT with the TLSO brace to be worn out of bed. - I will also get standing x-rays with her in a brace. - will reassess tomorrow for need of surgery.

## 2019-07-03 NOTE — Progress Notes (Signed)
Subjective: CC: Back pain Patient reports that she is having mid and lower back pain.  Currently lying flat.  Has some pain on her scalp but denies any headache.  No chest pain.  Reports some abdominal pain but believes this is radiation from her back.  She has had some clear liquids and tolerated without nausea or emesis.  Not she has not urinated since arriving to the floor last night.  She denies any pain in her extremities.  Lives at home with her husband. Has not received TLSO yet.   Objective: Vital signs in last 24 hours: Temp:  [98.2 F (36.8 C)-98.8 F (37.1 C)] 98.3 F (36.8 C) (05/09 0205) Pulse Rate:  [69-97] 77 (05/09 0205) Resp:  [11-21] 17 (05/09 0205) BP: (94-128)/(47-79) 98/60 (05/09 0426) SpO2:  [92 %-100 %] 99 % (05/09 0205) Weight:  [53.5 kg] 53.5 kg (05/08 1308) Last BM Date: 07/02/19  Intake/Output from previous day: 05/08 0701 - 05/09 0700 In: 500 [IV Piggyback:500] Out: -  Intake/Output this shift: No intake/output data recorded.  PE: Gen:  Alert, NAD, pleasant HEENT: EOM's intact, pupils equal and round. Scalp lac with staples in place. C/d/i without signs of bleeding.  Card:  RRR Pulm:  CTAB, no W/R/R, effort normal Abd: Soft, ND, +BS. Notes tenderness in her back with palpation of abdomen. NT over abdomen.  Ext:  Passive ROM of BUE's and BLE's without pain Psych: A&Ox3  Skin: no rashes noted, warm and dry  Lab Results:  Recent Labs    07/02/19 1349 07/03/19 0245  WBC 5.8 5.7  HGB 14.3 11.6*  HCT 44.6 36.1  PLT 123* 114*   BMET Recent Labs    07/02/19 1349 07/03/19 0245  NA 142 141  K 3.9 3.8  CL 105 108  CO2 25 22  GLUCOSE 126* 135*  BUN 12 9  CREATININE 1.12* 0.80  CALCIUM 9.3 8.4*   PT/INR Recent Labs    07/02/19 1349  LABPROT 12.1  INR 0.9   CMP     Component Value Date/Time   NA 141 07/03/2019 0245   NA 140 12/31/2016 1406   K 3.8 07/03/2019 0245   K 3.6 12/31/2016 1406   CL 108 07/03/2019 0245   CL 101  12/15/2011 1322   CO2 22 07/03/2019 0245   CO2 24 12/31/2016 1406   GLUCOSE 135 (H) 07/03/2019 0245   GLUCOSE 125 12/31/2016 1406   GLUCOSE 83 12/15/2011 1322   BUN 9 07/03/2019 0245   BUN 12.8 12/31/2016 1406   CREATININE 0.80 07/03/2019 0245   CREATININE 0.82 12/15/2018 1425   CREATININE 0.9 12/31/2016 1406   CALCIUM 8.4 (L) 07/03/2019 0245   CALCIUM 8.8 12/31/2016 1406   PROT 6.2 (L) 07/02/2019 1349   PROT 6.0 (L) 12/31/2016 1406   ALBUMIN 3.8 07/02/2019 1349   ALBUMIN 3.6 12/31/2016 1406   AST 49 (H) 07/02/2019 1349   AST 18 12/15/2018 1425   AST 18 12/31/2016 1406   ALT 48 (H) 07/02/2019 1349   ALT 17 12/15/2018 1425   ALT 11 12/31/2016 1406   ALKPHOS 106 07/02/2019 1349   ALKPHOS 71 12/31/2016 1406   BILITOT 0.7 07/02/2019 1349   BILITOT 0.3 12/15/2018 1425   BILITOT 0.49 12/31/2016 1406   GFRNONAA >60 07/03/2019 0245   GFRNONAA >60 12/15/2018 1425   GFRAA >60 07/03/2019 0245   GFRAA >60 12/15/2018 1425   Lipase     Component Value Date/Time   LIPASE 30  10/22/2018 1147       Studies/Results: CT HEAD WO CONTRAST  Result Date: 07/02/2019 CLINICAL DATA:  Pain status post fall from ladder. Laceration and bleeding to the back of the head. EXAM: CT HEAD WITHOUT CONTRAST CT CERVICAL SPINE WITHOUT CONTRAST CT CHEST, ABDOMEN AND PELVIS WITHOUT CONTRAST TECHNIQUE: Contiguous axial images were obtained from the base of the skull through the vertex without intravenous contrast. Multidetector CT imaging of the cervical spine was performed without intravenous contrast. Multiplanar CT image reconstructions were also generated. Multidetector CT imaging of the chest, abdomen and pelvis was performed following the standard protocol without IV contrast. COMPARISON:  CT of the pelvis dated October 22, 2018. PET-CT dated January 06, 2018. FINDINGS: CT HEAD FINDINGS Brain: No evidence of acute infarction, hemorrhage, hydrocephalus, extra-axial collection or mass lesion/mass effect. There  is a small punctate density in the anterior left frontal lobe (axial series 4, image 25). This is favored to represent a small calcification as opposed to subarachnoid hemorrhage. There are bilateral hyperdensities in the CP angles favored to represent artifact. Vascular: No hyperdense vessel or unexpected calcification. Skull: There is extensive left-sided scalp swelling with pockets of subcutaneous gas and a large scalp laceration. There is no acute displaced calvarial fracture. Sinuses/Orbits: No acute finding. Other: None. CT CERVICAL FINDINGS Alignment: Normal. Skull base and vertebrae: No acute fracture. No primary bone lesion or focal pathologic process. Soft tissues and spinal canal: No prevertebral fluid or swelling. No visible canal hematoma. Disc levels:  Mild disc height loss is noted in the cervical spine. Other: None. CT CHEST FINDINGS Cardiovascular: The heart size is unremarkable. There is a well-positioned right-sided Port-A-Cath in place. There is no evidence for thoracic aortic dissection or aneurysm. There is no large centrally located pulmonary embolism. No significant pericardial effusion. Mediastinum/Nodes: --No mediastinal or hilar lymphadenopathy. --No axillary lymphadenopathy. --No supraclavicular lymphadenopathy. --Normal thyroid gland. --The esophagus is unremarkable Lungs/Pleura: No pulmonary nodules or masses. No pleural effusion or pneumothorax. No focal airspace consolidation. No focal pleural abnormality. Musculoskeletal: There is no evidence for rib fracture. There is an acute fracture of the T9 vertebral body. This fracture extends superiorly through both endplates and posteriorly into the posterior elements including the bilateral lamina. There is no significant retropulsion at this level. CT ABDOMEN AND PELVIS FINDINGS Hepatobiliary: The liver is normal. Normal gallbladder.There is no biliary ductal dilation. Pancreas: Normal contours without ductal dilatation. No peripancreatic  fluid collection. Spleen: Unremarkable. Adrenals/Urinary Tract: --Adrenal glands: Unremarkable. --Right kidney/ureter: No hydronephrosis or radiopaque kidney stones. --Left kidney/ureter: No hydronephrosis or radiopaque kidney stones. --Urinary bladder: Unremarkable. Stomach/Bowel: --Stomach/Duodenum: No hiatal hernia or other gastric abnormality. Normal duodenal course and caliber. --Small bowel: Unremarkable. --Colon: There is scattered colonic diverticula without CT evidence for diverticulitis. --Appendix: Normal. Vascular/Lymphatic: Atherosclerotic calcification is present within the non-aneurysmal abdominal aorta, without hemodynamically significant stenosis. --No retroperitoneal lymphadenopathy. --No mesenteric lymphadenopathy. --No pelvic or inguinal lymphadenopathy. Reproductive: Unremarkable Other: No ascites or free air. There is a small fat containing umbilical hernia. Musculoskeletal. There is severe disc height loss at the L5-S1 level. There is no acute lumbar spine fracture. IMPRESSION: 1. Extensive left-sided scalp swelling with pockets of subcutaneous gas and a large scalp laceration. No acute displaced calvarial fracture. 2. No acute intracranial abnormality. 3. No acute fracture or malalignment of the cervical spine. 4. Acute T9 fracture involving the posterior elements consistent with a posterior tension band disruption. 5. No acute solid organ injury within the chest, abdomen, or pelvis. Aortic Atherosclerosis (ICD10-I70.0). Electronically Signed  By: Constance Holster M.D.   On: 07/02/2019 15:46   CT CHEST W CONTRAST  Result Date: 07/02/2019 CLINICAL DATA:  Pain status post fall from ladder. Laceration and bleeding to the back of the head. EXAM: CT HEAD WITHOUT CONTRAST CT CERVICAL SPINE WITHOUT CONTRAST CT CHEST, ABDOMEN AND PELVIS WITHOUT CONTRAST TECHNIQUE: Contiguous axial images were obtained from the base of the skull through the vertex without intravenous contrast. Multidetector CT  imaging of the cervical spine was performed without intravenous contrast. Multiplanar CT image reconstructions were also generated. Multidetector CT imaging of the chest, abdomen and pelvis was performed following the standard protocol without IV contrast. COMPARISON:  CT of the pelvis dated October 22, 2018. PET-CT dated January 06, 2018. FINDINGS: CT HEAD FINDINGS Brain: No evidence of acute infarction, hemorrhage, hydrocephalus, extra-axial collection or mass lesion/mass effect. There is a small punctate density in the anterior left frontal lobe (axial series 4, image 25). This is favored to represent a small calcification as opposed to subarachnoid hemorrhage. There are bilateral hyperdensities in the CP angles favored to represent artifact. Vascular: No hyperdense vessel or unexpected calcification. Skull: There is extensive left-sided scalp swelling with pockets of subcutaneous gas and a large scalp laceration. There is no acute displaced calvarial fracture. Sinuses/Orbits: No acute finding. Other: None. CT CERVICAL FINDINGS Alignment: Normal. Skull base and vertebrae: No acute fracture. No primary bone lesion or focal pathologic process. Soft tissues and spinal canal: No prevertebral fluid or swelling. No visible canal hematoma. Disc levels:  Mild disc height loss is noted in the cervical spine. Other: None. CT CHEST FINDINGS Cardiovascular: The heart size is unremarkable. There is a well-positioned right-sided Port-A-Cath in place. There is no evidence for thoracic aortic dissection or aneurysm. There is no large centrally located pulmonary embolism. No significant pericardial effusion. Mediastinum/Nodes: --No mediastinal or hilar lymphadenopathy. --No axillary lymphadenopathy. --No supraclavicular lymphadenopathy. --Normal thyroid gland. --The esophagus is unremarkable Lungs/Pleura: No pulmonary nodules or masses. No pleural effusion or pneumothorax. No focal airspace consolidation. No focal pleural  abnormality. Musculoskeletal: There is no evidence for rib fracture. There is an acute fracture of the T9 vertebral body. This fracture extends superiorly through both endplates and posteriorly into the posterior elements including the bilateral lamina. There is no significant retropulsion at this level. CT ABDOMEN AND PELVIS FINDINGS Hepatobiliary: The liver is normal. Normal gallbladder.There is no biliary ductal dilation. Pancreas: Normal contours without ductal dilatation. No peripancreatic fluid collection. Spleen: Unremarkable. Adrenals/Urinary Tract: --Adrenal glands: Unremarkable. --Right kidney/ureter: No hydronephrosis or radiopaque kidney stones. --Left kidney/ureter: No hydronephrosis or radiopaque kidney stones. --Urinary bladder: Unremarkable. Stomach/Bowel: --Stomach/Duodenum: No hiatal hernia or other gastric abnormality. Normal duodenal course and caliber. --Small bowel: Unremarkable. --Colon: There is scattered colonic diverticula without CT evidence for diverticulitis. --Appendix: Normal. Vascular/Lymphatic: Atherosclerotic calcification is present within the non-aneurysmal abdominal aorta, without hemodynamically significant stenosis. --No retroperitoneal lymphadenopathy. --No mesenteric lymphadenopathy. --No pelvic or inguinal lymphadenopathy. Reproductive: Unremarkable Other: No ascites or free air. There is a small fat containing umbilical hernia. Musculoskeletal. There is severe disc height loss at the L5-S1 level. There is no acute lumbar spine fracture. IMPRESSION: 1. Extensive left-sided scalp swelling with pockets of subcutaneous gas and a large scalp laceration. No acute displaced calvarial fracture. 2. No acute intracranial abnormality. 3. No acute fracture or malalignment of the cervical spine. 4. Acute T9 fracture involving the posterior elements consistent with a posterior tension band disruption. 5. No acute solid organ injury within the chest, abdomen, or pelvis. Aortic  Atherosclerosis (ICD10-I70.0). Electronically Signed   By: Constance Holster M.D.   On: 07/02/2019 15:46   CT CERVICAL SPINE WO CONTRAST  Result Date: 07/02/2019 CLINICAL DATA:  Pain status post fall from ladder. Laceration and bleeding to the back of the head. EXAM: CT HEAD WITHOUT CONTRAST CT CERVICAL SPINE WITHOUT CONTRAST CT CHEST, ABDOMEN AND PELVIS WITHOUT CONTRAST TECHNIQUE: Contiguous axial images were obtained from the base of the skull through the vertex without intravenous contrast. Multidetector CT imaging of the cervical spine was performed without intravenous contrast. Multiplanar CT image reconstructions were also generated. Multidetector CT imaging of the chest, abdomen and pelvis was performed following the standard protocol without IV contrast. COMPARISON:  CT of the pelvis dated October 22, 2018. PET-CT dated January 06, 2018. FINDINGS: CT HEAD FINDINGS Brain: No evidence of acute infarction, hemorrhage, hydrocephalus, extra-axial collection or mass lesion/mass effect. There is a small punctate density in the anterior left frontal lobe (axial series 4, image 25). This is favored to represent a small calcification as opposed to subarachnoid hemorrhage. There are bilateral hyperdensities in the CP angles favored to represent artifact. Vascular: No hyperdense vessel or unexpected calcification. Skull: There is extensive left-sided scalp swelling with pockets of subcutaneous gas and a large scalp laceration. There is no acute displaced calvarial fracture. Sinuses/Orbits: No acute finding. Other: None. CT CERVICAL FINDINGS Alignment: Normal. Skull base and vertebrae: No acute fracture. No primary bone lesion or focal pathologic process. Soft tissues and spinal canal: No prevertebral fluid or swelling. No visible canal hematoma. Disc levels:  Mild disc height loss is noted in the cervical spine. Other: None. CT CHEST FINDINGS Cardiovascular: The heart size is unremarkable. There is a well-positioned  right-sided Port-A-Cath in place. There is no evidence for thoracic aortic dissection or aneurysm. There is no large centrally located pulmonary embolism. No significant pericardial effusion. Mediastinum/Nodes: --No mediastinal or hilar lymphadenopathy. --No axillary lymphadenopathy. --No supraclavicular lymphadenopathy. --Normal thyroid gland. --The esophagus is unremarkable Lungs/Pleura: No pulmonary nodules or masses. No pleural effusion or pneumothorax. No focal airspace consolidation. No focal pleural abnormality. Musculoskeletal: There is no evidence for rib fracture. There is an acute fracture of the T9 vertebral body. This fracture extends superiorly through both endplates and posteriorly into the posterior elements including the bilateral lamina. There is no significant retropulsion at this level. CT ABDOMEN AND PELVIS FINDINGS Hepatobiliary: The liver is normal. Normal gallbladder.There is no biliary ductal dilation. Pancreas: Normal contours without ductal dilatation. No peripancreatic fluid collection. Spleen: Unremarkable. Adrenals/Urinary Tract: --Adrenal glands: Unremarkable. --Right kidney/ureter: No hydronephrosis or radiopaque kidney stones. --Left kidney/ureter: No hydronephrosis or radiopaque kidney stones. --Urinary bladder: Unremarkable. Stomach/Bowel: --Stomach/Duodenum: No hiatal hernia or other gastric abnormality. Normal duodenal course and caliber. --Small bowel: Unremarkable. --Colon: There is scattered colonic diverticula without CT evidence for diverticulitis. --Appendix: Normal. Vascular/Lymphatic: Atherosclerotic calcification is present within the non-aneurysmal abdominal aorta, without hemodynamically significant stenosis. --No retroperitoneal lymphadenopathy. --No mesenteric lymphadenopathy. --No pelvic or inguinal lymphadenopathy. Reproductive: Unremarkable Other: No ascites or free air. There is a small fat containing umbilical hernia. Musculoskeletal. There is severe disc height  loss at the L5-S1 level. There is no acute lumbar spine fracture. IMPRESSION: 1. Extensive left-sided scalp swelling with pockets of subcutaneous gas and a large scalp laceration. No acute displaced calvarial fracture. 2. No acute intracranial abnormality. 3. No acute fracture or malalignment of the cervical spine. 4. Acute T9 fracture involving the posterior elements consistent with a posterior tension band disruption. 5. No acute solid organ injury within  the chest, abdomen, or pelvis. Aortic Atherosclerosis (ICD10-I70.0). Electronically Signed   By: Constance Holster M.D.   On: 07/02/2019 15:46   MR THORACIC SPINE WO CONTRAST  Result Date: 07/02/2019 CLINICAL DATA:  Spine fracture.  Preop. EXAM: MRI THORACIC SPINE WITHOUT CONTRAST TECHNIQUE: Multiplanar, multisequence MR imaging of the thoracic spine was performed. No intravenous contrast was administered. COMPARISON:  None. FINDINGS: Alignment: Mild degenerative anterolisthesis at T1-2. No traumatic malalignment. Exaggerated thoracic kyphosis. Vertebrae: Marrow edema within the T9 body with height loss measuring 60% when compared to T8. Minimal posterior cortex buckling. There is anterior displacement of vertebral body. T4 superior endplate with mild depression and marrow edema. No evidence of bone lesion. Cord:  No evidence of injury.  No intrathecal collection. Paraspinal and other soft tissues: Paravertebral edema at the level of T9 fracture. There is strain of intrinsic back muscles at the level of T9, extending inferiorly and leftward. Subcutaneous contusion and mild muscular strain also seen at the upper thoracic levels. Disc levels: No significant disc degeneration or degenerative impingement. IMPRESSION: 1. T9 body fracture with 60% height loss. 2. T4 superior endplate fracture with minimal depression. 3. Back muscle strain without major ligamentous disruption. Electronically Signed   By: Monte Fantasia M.D.   On: 07/02/2019 20:50   CT ABDOMEN  PELVIS W CONTRAST  Result Date: 07/02/2019 CLINICAL DATA:  Pain status post fall from ladder. Laceration and bleeding to the back of the head. EXAM: CT HEAD WITHOUT CONTRAST CT CERVICAL SPINE WITHOUT CONTRAST CT CHEST, ABDOMEN AND PELVIS WITHOUT CONTRAST TECHNIQUE: Contiguous axial images were obtained from the base of the skull through the vertex without intravenous contrast. Multidetector CT imaging of the cervical spine was performed without intravenous contrast. Multiplanar CT image reconstructions were also generated. Multidetector CT imaging of the chest, abdomen and pelvis was performed following the standard protocol without IV contrast. COMPARISON:  CT of the pelvis dated October 22, 2018. PET-CT dated January 06, 2018. FINDINGS: CT HEAD FINDINGS Brain: No evidence of acute infarction, hemorrhage, hydrocephalus, extra-axial collection or mass lesion/mass effect. There is a small punctate density in the anterior left frontal lobe (axial series 4, image 25). This is favored to represent a small calcification as opposed to subarachnoid hemorrhage. There are bilateral hyperdensities in the CP angles favored to represent artifact. Vascular: No hyperdense vessel or unexpected calcification. Skull: There is extensive left-sided scalp swelling with pockets of subcutaneous gas and a large scalp laceration. There is no acute displaced calvarial fracture. Sinuses/Orbits: No acute finding. Other: None. CT CERVICAL FINDINGS Alignment: Normal. Skull base and vertebrae: No acute fracture. No primary bone lesion or focal pathologic process. Soft tissues and spinal canal: No prevertebral fluid or swelling. No visible canal hematoma. Disc levels:  Mild disc height loss is noted in the cervical spine. Other: None. CT CHEST FINDINGS Cardiovascular: The heart size is unremarkable. There is a well-positioned right-sided Port-A-Cath in place. There is no evidence for thoracic aortic dissection or aneurysm. There is no large  centrally located pulmonary embolism. No significant pericardial effusion. Mediastinum/Nodes: --No mediastinal or hilar lymphadenopathy. --No axillary lymphadenopathy. --No supraclavicular lymphadenopathy. --Normal thyroid gland. --The esophagus is unremarkable Lungs/Pleura: No pulmonary nodules or masses. No pleural effusion or pneumothorax. No focal airspace consolidation. No focal pleural abnormality. Musculoskeletal: There is no evidence for rib fracture. There is an acute fracture of the T9 vertebral body. This fracture extends superiorly through both endplates and posteriorly into the posterior elements including the bilateral lamina. There is no significant retropulsion  at this level. CT ABDOMEN AND PELVIS FINDINGS Hepatobiliary: The liver is normal. Normal gallbladder.There is no biliary ductal dilation. Pancreas: Normal contours without ductal dilatation. No peripancreatic fluid collection. Spleen: Unremarkable. Adrenals/Urinary Tract: --Adrenal glands: Unremarkable. --Right kidney/ureter: No hydronephrosis or radiopaque kidney stones. --Left kidney/ureter: No hydronephrosis or radiopaque kidney stones. --Urinary bladder: Unremarkable. Stomach/Bowel: --Stomach/Duodenum: No hiatal hernia or other gastric abnormality. Normal duodenal course and caliber. --Small bowel: Unremarkable. --Colon: There is scattered colonic diverticula without CT evidence for diverticulitis. --Appendix: Normal. Vascular/Lymphatic: Atherosclerotic calcification is present within the non-aneurysmal abdominal aorta, without hemodynamically significant stenosis. --No retroperitoneal lymphadenopathy. --No mesenteric lymphadenopathy. --No pelvic or inguinal lymphadenopathy. Reproductive: Unremarkable Other: No ascites or free air. There is a small fat containing umbilical hernia. Musculoskeletal. There is severe disc height loss at the L5-S1 level. There is no acute lumbar spine fracture. IMPRESSION: 1. Extensive left-sided scalp swelling  with pockets of subcutaneous gas and a large scalp laceration. No acute displaced calvarial fracture. 2. No acute intracranial abnormality. 3. No acute fracture or malalignment of the cervical spine. 4. Acute T9 fracture involving the posterior elements consistent with a posterior tension band disruption. 5. No acute solid organ injury within the chest, abdomen, or pelvis. Aortic Atherosclerosis (ICD10-I70.0). Electronically Signed   By: Constance Holster M.D.   On: 07/02/2019 15:46    Anti-infectives: Anti-infectives (From admission, onward)   Start     Dose/Rate Route Frequency Ordered Stop   07/02/19 2300  acyclovir (ZOVIRAX) tablet 400 mg     400 mg Oral 2 times daily 07/02/19 2245         Assessment/Plan Fall down pulldown attic stairs Large scalp laceration -repaired in the ED by Dr. Sabra Heck T4 and T9 fractures -TLSO per Dr. 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 AM CBC FEN - FLD, AAT VTE - SCDs, start Lovenox when cleared by NS ID - None Foley - External, bladder scan  Dispo: NS consult. PT/OT. Lives at home with husband.    LOS: 1 day    Jillyn Ledger , [facility] Surgery 07/03/2019, 8:51 AM Please see Amion for pager number during day hours 7:00am-4:30pm

## 2019-07-03 NOTE — Evaluation (Addendum)
Physical Therapy Evaluation Patient Details Name: Rachel [name] MRN: [id number] DOB: December 22, 1950 Today's Date: 07/03/2019   History of Present Illness  Pt is a 69 y/o female with PMHx including Chronic lymphoblastic leukemia, Diverticulitis, and Lower back pain who suffered a fall down attic stairs, suffering a large scalp laceration (repaired in ED), T4 andT9 fracture. Neurosx currently assessing need for surgery.   Clinical Impression  Prior to admission, pt lives with her spouse in a two level home and is active; she enjoys doing yoga and aerobics. On PT evaluation, pt able to perform room ambulation with a walker at a min guard assist level. Denies increased back pain with mobility; reports mild discomfort with heaviness of TLSO. Education provided regarding spinal precautions and brace use. Expect steady progress given her PLOF and motivation. Will continue to progress as tolerated.     Follow Up Recommendations Home health PT;Supervision for mobility/OOB    Equipment Recommendations  Rolling walker with 5" wheels;3in1 (PT)    Recommendations for Other Services       Precautions / Restrictions Precautions Precautions: Fall;Back Precaution Booklet Issued: Yes (comment) Required Braces or Orthoses: Spinal Brace Spinal Brace: Thoracolumbosacral orthotic(for OOB, per orders okay to remove to shower) Restrictions Weight Bearing Restrictions: No      Mobility  Bed Mobility Overal bed mobility: Needs Assistance Bed Mobility: Rolling;Sidelying to Sit Rolling: Min assist Sidelying to sit: Min assist       General bed mobility comments: VCs for log roll technique  Transfers Overall transfer level: Needs assistance Equipment used: Rolling walker (2 wheeled) Transfers: Sit to/from Stand Sit to Stand: Min guard         General transfer comment: for safety and balance, VCs for safe hand placement. Pt stood from EOB and BSC over toilet  Ambulation/Gait Ambulation/Gait  assistance: Min guard Gait Distance (Feet): 15 Feet Assistive device: Rolling walker (2 wheeled) Gait Pattern/deviations: Step-through pattern;Decreased stride length Gait velocity: decreased Gait velocity interpretation: <1.8 ft/sec, indicate of risk for recurrent falls General Gait Details: Slow, cautious gait with min guard assist for safety. No gross imbalance  Stairs            Wheelchair Mobility    Modified Rankin (Stroke Patients Only)       Balance Overall balance assessment: Needs assistance Sitting-balance support: Feet supported Sitting balance-Leahy Scale: Fair Sitting balance - Comments: minguard due to dizziness with intial sitting   Standing balance support: Bilateral upper extremity supported;During functional activity Standing balance-Leahy Scale: Fair Standing balance comment: close minguard for balance while washing hands                             Pertinent Vitals/Pain Pain Assessment: Faces Faces Pain Scale: Hurts little more Pain Location: back Pain Descriptors / Indicators: Sore;Heaviness;Discomfort Pain Intervention(s): Limited activity within patient's tolerance;Monitored during session;Repositioned    Home Living Family/patient expects to be discharged to:: Private residence Living Arrangements: Spouse/significant other Available Help at Discharge: Family;Available 24 hours/day(sister also coming into town) Type of Home: House Home Access: Stairs to enter Entrance Stairs-Rails: Psychiatric nurse of Steps: [place]: Two level;1/2 bath on main level Home Equipment: Shower seat      Prior Function Level of Independence: Independent         Comments: very active     Hand Dominance        Extremity/Trunk Assessment   Upper Extremity Assessment Upper Extremity Assessment: Overall WFL for  tasks assessed    Lower Extremity Assessment Lower Extremity Assessment: RLE deficits/detail;LLE  deficits/detail RLE Deficits / Details: Strength 5/5 LLE Deficits / Details: Strength 5/5       Communication   Communication: No difficulties  Cognition Arousal/Alertness: Awake/alert Behavior During Therapy: WFL for tasks assessed/performed Overall Cognitive Status: Within Functional Limits for tasks assessed                                        General Comments      Exercises     Assessment/Plan    PT Assessment Patient needs continued PT services  PT Problem List Decreased strength;Decreased activity tolerance;Decreased balance;Decreased mobility;Pain;Decreased knowledge of precautions       PT Treatment Interventions DME instruction;Gait training;Stair training;Functional mobility training;Therapeutic activities;Therapeutic exercise;Balance training;Patient/family education    PT Goals (Current goals can be found in the Care Plan section)  Acute Rehab PT Goals Patient Stated Goal: regain her independence  PT Goal Formulation: With patient Time For Goal Achievement: 07/17/19 Potential to Achieve Goals: Good    Frequency Min 5X/week   Barriers to discharge        Co-evaluation PT/OT/SLP Co-Evaluation/Treatment: Yes Reason for Co-Treatment: For patient/therapist safety;To address functional/ADL transfers PT goals addressed during session: Mobility/safety with mobility OT goals addressed during session: ADL's and self-care       AM-PAC PT "6 Clicks" Mobility  Outcome Measure Help needed turning from your back to your side while in a flat bed without using bedrails?: A Little Help needed moving from lying on your back to sitting on the side of a flat bed without using bedrails?: A Little Help needed moving to and from a bed to a chair (including a wheelchair)?: A Little Help needed standing up from a chair using your arms (e.g., wheelchair or bedside chair)?: A Little Help needed to walk in hospital room?: A Little Help needed climbing 3-5  steps with a railing? : A Little 6 Click Score: 18    End of Session Equipment Utilized During Treatment: Gait belt;Back brace Activity Tolerance: Patient tolerated treatment well Patient left: in chair;with call bell/phone within reach;with chair alarm set Nurse Communication: Mobility status PT Visit Diagnosis: Difficulty in walking, not elsewhere classified (R26.2);Pain Pain - part of body: (back)    Time: [id number] PT Time Calculation (min) (ACUTE ONLY): 24 min   Charges:   PT Evaluation $PT Eval Moderate Complexity: 1 Mod            Wyona Almas, PT, DPT Acute Rehabilitation Services Pager [phone] Office [phone]   Deno Etienne 07/03/2019, 3:20 PM

## 2019-07-03 NOTE — Plan of Care (Signed)
  Problem: Education: Goal: Knowledge of General Education information will improve Description Including pain rating scale, medication(s)/side effects and non-pharmacologic comfort measures Outcome: Progressing   

## 2019-07-03 NOTE — Evaluation (Signed)
Occupational Therapy Evaluation Patient Details Name: Rachel [name] MRN: [id number] DOB: Jul 16, 1950 Today's Date: 07/03/2019    History of Present Illness Pt is a 69 y/o female with PMHx including Chronic lymphoblastic leukemia, Diverticulitis, and Lower back pain who suffered a fall down attic stairs, suffering a large scalp laceration (repaired in ED), T4 andT9 fracture. Neurosx currently assessing need for surgery.    Clinical Impression   This 69 y/o female presents with the above. PTA pt reports very independent with ADL, iADL and functional mobility, living with spouse and reports is active. Today pt tolerating room level mobility using RW, toileting and standing grooming ADL tasks. Pt requiring minA for mobility tasks using RW, up to maxA for toileting and LB ADL due to pain and limitations given current back precautions. Pt reports some dizziness with initial sitting which subsides with increased time upright prior to mobilizing. She will benefit from continued acute OT services; currently recommend follow up [facility] services however anticipate pt to progress well (may progress to point of needing no follow-up). Will continue to follow for pt progress.     Follow Up Recommendations  Home health OT;Supervision/Assistance - 24 hour(may progress to no follow-up)    Equipment Recommendations  3 in 1 bedside commode           Precautions / Restrictions Precautions Precautions: Fall;Back Precaution Booklet Issued: Yes (comment) Required Braces or Orthoses: Spinal Brace Spinal Brace: Thoracolumbosacral orthotic(for OOB, per orders okay to remove to shower) Restrictions Weight Bearing Restrictions: No      Mobility Bed Mobility Overal bed mobility: Needs Assistance Bed Mobility: Rolling;Sidelying to Sit Rolling: Min assist Sidelying to sit: Min assist       General bed mobility comments: VCs for log roll technique  Transfers Overall transfer level: Needs  assistance Equipment used: Rolling walker (2 wheeled) Transfers: Sit to/from Stand Sit to Stand: Min guard         General transfer comment: for safety and balance, VCs for safe hand placement. Pt stood from EOB and BSC over toilet    Balance Overall balance assessment: Needs assistance Sitting-balance support: Feet supported Sitting balance-Leahy Scale: Fair Sitting balance - Comments: minguard due to dizziness with intial sitting   Standing balance support: Bilateral upper extremity supported;During functional activity Standing balance-Leahy Scale: Fair Standing balance comment: close minguard for balance while washing hands                           ADL either performed or assessed with clinical judgement   ADL Overall ADL's : Needs assistance/impaired Eating/Feeding: Modified independent;Sitting   Grooming: Wash/dry hands;Min guard;Standing   Upper Body Bathing: Set up;Supervision/ safety;Sitting   Lower Body Bathing: Moderate assistance;Sit to/from stand Lower Body Bathing Details (indicate cue type and reason): given back precautions Upper Body Dressing : Sitting;Maximal assistance Upper Body Dressing Details (indicate cue type and reason): for TLSO management/adjustment Lower Body Dressing: Maximal assistance;Sit to/from stand   Toilet Transfer: Minimal assistance;Ambulation;BSC;RW Toilet Transfer Details (indicate cue type and reason): BSC over toilet Toileting- Clothing Manipulation and Hygiene: Maximal assistance;Sit to/from stand Toileting - Clothing Manipulation Details (indicate cue type and reason): pt unable to perform pericare - requiring assist after voiding bladder, assist for gown management     Functional mobility during ADLs: Minimal assistance;Rolling walker General ADL Comments: initiated education of general back precautiosn in relation to ADL/functional tasks     Vision   Vision Assessment?: Yes Eye Alignment: Within Functional  Limits Ocular Range of Motion: Impaired-to be further tested in functional context Tracking/Visual Pursuits: Decreased smoothness of horizontal tracking;Other (comment)(nystagmus noted with tracking to the L initially)     Perception     Praxis      Pertinent Vitals/Pain Pain Assessment: Faces Faces Pain Scale: Hurts little more Pain Location: back Pain Descriptors / Indicators: Sore;Heaviness;Discomfort Pain Intervention(s): Limited activity within patient's tolerance;Monitored during session;Repositioned     Hand Dominance     Extremity/Trunk Assessment Upper Extremity Assessment Upper Extremity Assessment: Overall WFL for tasks assessed   Lower Extremity Assessment Lower Extremity Assessment: Defer to PT evaluation       Communication Communication Communication: No difficulties   Cognition Arousal/Alertness: Awake/alert Behavior During Therapy: WFL for tasks assessed/performed Overall Cognitive Status: Within Functional Limits for tasks assessed                                     General Comments       Exercises     Shoulder Instructions      Home Living Family/patient expects to be discharged to:: Private residence Living Arrangements: Spouse/significant other Available Help at Discharge: Family;Available 24 hours/day(sister also coming into town) Type of Home: House Home Access: Stairs to enter Technical brewer of Steps: 4 Entrance Stairs-Rails: Right;Left Home Layout: Two level;1/2 bath on main level Alternate Level Stairs-Number of Steps: flight Alternate Level Stairs-Rails: Right;Left;Can reach both Bathroom Shower/Tub: Occupational psychologist: (one of each)     Home Equipment: Shower seat          Prior Functioning/Environment Level of Independence: Independent        Comments: very active        OT Problem List: Decreased strength;Decreased range of motion;Decreased activity tolerance;Impaired balance  (sitting and/or standing);Decreased knowledge of use of DME or AE;Decreased knowledge of precautions;Pain      OT Treatment/Interventions: Self-care/ADL training;Therapeutic exercise;Energy conservation;DME and/or AE instruction;Therapeutic activities;Patient/family education;Balance training    OT Goals(Current goals can be found in the care plan section) Acute Rehab OT Goals Patient Stated Goal: regain her independence  OT Goal Formulation: With patient Time For Goal Achievement: 07/17/19 Potential to Achieve Goals: Good  OT Frequency: Min 2X/week   Barriers to D/C:            Co-evaluation PT/OT/SLP Co-Evaluation/Treatment: Yes Reason for Co-Treatment: For patient/therapist safety;To address functional/ADL transfers   OT goals addressed during session: ADL's and self-care      AM-PAC OT "6 Clicks" Daily Activity     Outcome Measure Help from another person eating meals?: None Help from another person taking care of personal grooming?: A Little Help from another person toileting, which includes using toliet, bedpan, or urinal?: A Lot Help from another person bathing (including washing, rinsing, drying)?: A Lot Help from another person to put on and taking off regular upper body clothing?: A Lot Help from another person to put on and taking off regular lower body clothing?: A Lot 6 Click Score: 15   End of Session Equipment Utilized During Treatment: Gait belt;Rolling walker;Back brace Nurse Communication: Mobility status  Activity Tolerance: Patient tolerated treatment well Patient left: in chair;with call bell/phone within reach;with chair alarm set  OT Visit Diagnosis: Other abnormalities of gait and mobility (R26.89);Pain;History of falling (Z91.81) Pain - part of body: (back)                Time: [id number]  OT Time Calculation (min): 24 min Charges:  OT General Charges $OT Visit: 1 Visit OT Evaluation $OT Eval Moderate Complexity: [place],  OT Acute Rehabilitation Services Pager [phone] Office [place] 07/03/2019, 2:19 PM

## 2019-07-03 NOTE — Progress Notes (Signed)
PT Cancellation Note  Patient Details Name: Rachel [name] MRN: [id number] DOB: 1950/10/15   Cancelled Treatment:    Reason Eval/Treat Not Completed: Other (comment) Pt has not received TLSO.    Wyona Almas, PT, DPT Acute Rehabilitation Services Pager [phone] Office [phone]    Deno Etienne 07/03/2019, 11:43 AM

## 2019-07-04 DIAGNOSIS — S22079A Unspecified fracture of T9-T10 vertebra, initial encounter for closed fracture: Secondary | ICD-10-CM

## 2019-07-04 DIAGNOSIS — C9110 Chronic lymphocytic leukemia of B-cell type not having achieved remission: Secondary | ICD-10-CM

## 2019-07-04 DIAGNOSIS — S0101XA Laceration without foreign body of scalp, initial encounter: Principal | ICD-10-CM

## 2019-07-04 DIAGNOSIS — W19XXXA Unspecified fall, initial encounter: Secondary | ICD-10-CM

## 2019-07-04 LAB — CBC
HCT: 34.5 % — ABNORMAL LOW (ref 36.0–46.0)
Hemoglobin: 11 g/dL — ABNORMAL LOW (ref 12.0–15.0)
MCH: 28.6 pg (ref 26.0–34.0)
MCHC: 31.9 g/dL (ref 30.0–36.0)
MCV: 89.8 fL (ref 80.0–100.0)
Platelets: 119 10*3/uL — ABNORMAL LOW (ref 150–400)
RBC: 3.84 MIL/uL — ABNORMAL LOW (ref 3.87–5.11)
RDW: 13.1 % (ref 11.5–15.5)
WBC: 5.6 10*3/uL (ref 4.0–10.5)
nRBC: 0 % (ref 0.0–0.2)

## 2019-07-04 MED ORDER — POLYETHYLENE GLYCOL 3350 17 G PO PACK
17.0000 g | PACK | Freq: Every day | ORAL | Status: DC
  Administered 2019-07-04: 17 g via ORAL
  Filled 2019-07-04 (×2): qty 1

## 2019-07-04 MED ORDER — OXYCODONE HCL 5 MG PO TABS: 2.5000 mg | ORAL_TABLET | ORAL | Status: DC | PRN

## 2019-07-04 MED ORDER — METHOCARBAMOL 500 MG PO TABS
500.0000 mg | ORAL_TABLET | Freq: Three times a day (TID) | ORAL | Status: DC
  Administered 2019-07-04: 500 mg via ORAL
  Filled 2019-07-04: qty 1

## 2019-07-04 MED ORDER — POLYETHYLENE GLYCOL 3350 17 G PO PACK: 17.0000 g | PACK | Freq: Every day | ORAL | Status: DC | PRN

## 2019-07-04 MED ORDER — METHOCARBAMOL 500 MG PO TABS
1000.0000 mg | ORAL_TABLET | Freq: Three times a day (TID) | ORAL | Status: DC
  Administered 2019-07-04 – 2019-07-05 (×3): 1000 mg via ORAL
  Filled 2019-07-04 (×3): qty 2

## 2019-07-04 MED ORDER — TRAMADOL HCL 50 MG PO TABS
50.0000 mg | ORAL_TABLET | Freq: Four times a day (QID) | ORAL | Status: DC | PRN
  Administered 2019-07-04: 100 mg via ORAL
  Filled 2019-07-04: qty 2

## 2019-07-04 MED ORDER — CHLORHEXIDINE GLUCONATE CLOTH 2 % EX PADS: 6.0000 | MEDICATED_PAD | Freq: Every day | CUTANEOUS | Status: DC

## 2019-07-04 MED ORDER — ACETAMINOPHEN 500 MG PO TABS
500.0000 mg | ORAL_TABLET | Freq: Four times a day (QID) | ORAL | Status: DC
  Administered 2019-07-04: 500 mg via ORAL
  Filled 2019-07-04: qty 1

## 2019-07-04 MED ORDER — ACETAMINOPHEN 500 MG PO TABS
1000.0000 mg | ORAL_TABLET | Freq: Four times a day (QID) | ORAL | Status: DC
  Administered 2019-07-04 – 2019-07-05 (×4): 1000 mg via ORAL
  Filled 2019-07-04 (×4): qty 2

## 2019-07-04 NOTE — Progress Notes (Signed)
[facility] Progress Note     Subjective: Patient reports oxycodone makes her hallucinate and she wants to try not taking today. Tolerating diet but no BM since admission, +flatus. Denies chest pain or SOB. Brace is somewhat uncomfortable but she is trying to sit up more. Spoke with patient's husband as well.  Review of Systems  Constitutional: Negative for chills and fever.  Respiratory: Negative for shortness of breath and wheezing.   Cardiovascular: Negative for chest pain and palpitations.  Gastrointestinal: Positive for constipation. Negative for abdominal pain, nausea and vomiting.  Genitourinary: Negative for dysuria, frequency and urgency.  Musculoskeletal: Positive for back pain.  Neurological: Negative for weakness and headaches.  Psychiatric/Behavioral: Positive for hallucinations (with oxy).     Objective: Vital signs in last 24 hours: Temp:  [98.2 F (36.8 C)-99.6 F (37.6 C)] 98.8 F (37.1 C) (05/10 0443) Pulse Rate:  [76-87] 87 (05/10 0443) Resp:  [16-19] 17 (05/10 0443) BP: (92-110)/(43-61) 99/54 (05/10 0443) SpO2:  [91 %-100 %] 91 % (05/10 0443) Last BM Date: 07/02/19  Intake/Output from previous day: 05/09 0701 - 05/10 0700 In: 1806.4 [P.O.:570; I.V.:1236.4] Out: 500 [Urine:500] Intake/Output this shift: No intake/output data recorded.  PE: General: pleasant, WD, WN female who is sitting chair in NAD HEENT: laceration on the L with staples present and c/d/i, Sclera are noninjected.  PERRL.  Ears and nose without any masses or lesions.  Mouth is pink and moist Heart: regular, rate, and rhythm.  Normal s1,s2. No obvious murmurs, gallops, or rubs noted.  Palpable radial and pedal pulses bilaterally Lungs: CTAB, no wheezes, rhonchi, or rales noted.  Respiratory effort nonlabored Abd: soft, NT, ND, +BS, no masses, hernias, or organomegaly MS: all 4 extremities are symmetrical with no cyanosis, clubbing, or edema. TLSO brace present  Skin: warm and  dry with no masses, lesions, or rashes Neuro: Cranial nerves 2-12 grossly intact, sensation grossly intact throughout Psych: A&Ox3 with an appropriate affect.   Lab Results:  Recent Labs    07/03/19 0245 07/04/19 0256  WBC 5.7 5.6  HGB 11.6* 11.0*  HCT 36.1 34.5*  PLT 114* 119*   BMET Recent Labs    07/02/19 1349 07/03/19 0245  NA 142 141  K 3.9 3.8  CL 105 108  CO2 25 22  GLUCOSE 126* 135*  BUN 12 9  CREATININE 1.12* 0.80  CALCIUM 9.3 8.4*   PT/INR Recent Labs    07/02/19 1349  LABPROT 12.1  INR 0.9   CMP     Component Value Date/Time   NA 141 07/03/2019 0245   NA 140 12/31/2016 1406   K 3.8 07/03/2019 0245   K 3.6 12/31/2016 1406   CL 108 07/03/2019 0245   CL 101 12/15/2011 1322   CO2 22 07/03/2019 0245   CO2 24 12/31/2016 1406   GLUCOSE 135 (H) 07/03/2019 0245   GLUCOSE 125 12/31/2016 1406   GLUCOSE 83 12/15/2011 1322   BUN 9 07/03/2019 0245   BUN 12.8 12/31/2016 1406   CREATININE 0.80 07/03/2019 0245   CREATININE 0.82 12/15/2018 1425   CREATININE 0.9 12/31/2016 1406   CALCIUM 8.4 (L) 07/03/2019 0245   CALCIUM 8.8 12/31/2016 1406   PROT 6.2 (L) 07/02/2019 1349   PROT 6.0 (L) 12/31/2016 1406   ALBUMIN 3.8 07/02/2019 1349   ALBUMIN 3.6 12/31/2016 1406   AST 49 (H) 07/02/2019 1349   AST 18 12/15/2018 1425   AST 18 12/31/2016 1406   ALT 48 (H) 07/02/2019 1349   ALT  17 12/15/2018 1425   ALT 11 12/31/2016 1406   ALKPHOS 106 07/02/2019 1349   ALKPHOS 71 12/31/2016 1406   BILITOT 0.7 07/02/2019 1349   BILITOT 0.3 12/15/2018 1425   BILITOT 0.49 12/31/2016 1406   GFRNONAA >60 07/03/2019 0245   GFRNONAA >60 12/15/2018 1425   GFRAA >60 07/03/2019 0245   GFRAA >60 12/15/2018 1425   Lipase     Component Value Date/Time   LIPASE 30 10/22/2018 1147       Studies/Results: DG Thoracic Spine 2 View  Result Date: 07/03/2019 CLINICAL DATA:  Spine fracture. EXAM: THORACIC SPINE 2 VIEWS COMPARISON:  MRI and CT 1 day prior FINDINGS: There is a  right-sided Port-A-Cath in place. There has been interval placement of a brace. Again noted is a T9 vertebral body fracture better visualized on the patient's prior cross-sectional imaging. The known T4 compression fracture is not well visualized on this study. No additional acute displaced fracture identified. IMPRESSION: Again noted is a T9 compression fracture as previously described. The known T4 compression fracture is better visualized on the patient's prior cross-sectional imaging. Electronically Signed   By: Constance Holster M.D.   On: 07/03/2019 17:06   CT HEAD WO CONTRAST  Result Date: 07/02/2019 CLINICAL DATA:  Pain status post fall from ladder. Laceration and bleeding to the back of the head. EXAM: CT HEAD WITHOUT CONTRAST CT CERVICAL SPINE WITHOUT CONTRAST CT CHEST, ABDOMEN AND PELVIS WITHOUT CONTRAST TECHNIQUE: Contiguous axial images were obtained from the base of the skull through the vertex without intravenous contrast. Multidetector CT imaging of the cervical spine was performed without intravenous contrast. Multiplanar CT image reconstructions were also generated. Multidetector CT imaging of the chest, abdomen and pelvis was performed following the standard protocol without IV contrast. COMPARISON:  CT of the pelvis dated October 22, 2018. PET-CT dated January 06, 2018. FINDINGS: CT HEAD FINDINGS Brain: No evidence of acute infarction, hemorrhage, hydrocephalus, extra-axial collection or mass lesion/mass effect. There is a small punctate density in the anterior left frontal lobe (axial series 4, image 25). This is favored to represent a small calcification as opposed to subarachnoid hemorrhage. There are bilateral hyperdensities in the CP angles favored to represent artifact. Vascular: No hyperdense vessel or unexpected calcification. Skull: There is extensive left-sided scalp swelling with pockets of subcutaneous gas and a large scalp laceration. There is no acute displaced calvarial  fracture. Sinuses/Orbits: No acute finding. Other: None. CT CERVICAL FINDINGS Alignment: Normal. Skull base and vertebrae: No acute fracture. No primary bone lesion or focal pathologic process. Soft tissues and spinal canal: No prevertebral fluid or swelling. No visible canal hematoma. Disc levels:  Mild disc height loss is noted in the cervical spine. Other: None. CT CHEST FINDINGS Cardiovascular: The heart size is unremarkable. There is a well-positioned right-sided Port-A-Cath in place. There is no evidence for thoracic aortic dissection or aneurysm. There is no large centrally located pulmonary embolism. No significant pericardial effusion. Mediastinum/Nodes: --No mediastinal or hilar lymphadenopathy. --No axillary lymphadenopathy. --No supraclavicular lymphadenopathy. --Normal thyroid gland. --The esophagus is unremarkable Lungs/Pleura: No pulmonary nodules or masses. No pleural effusion or pneumothorax. No focal airspace consolidation. No focal pleural abnormality. Musculoskeletal: There is no evidence for rib fracture. There is an acute fracture of the T9 vertebral body. This fracture extends superiorly through both endplates and posteriorly into the posterior elements including the bilateral lamina. There is no significant retropulsion at this level. CT ABDOMEN AND PELVIS FINDINGS Hepatobiliary: The liver is normal. Normal gallbladder.There is no biliary  ductal dilation. Pancreas: Normal contours without ductal dilatation. No peripancreatic fluid collection. Spleen: Unremarkable. Adrenals/Urinary Tract: --Adrenal glands: Unremarkable. --Right kidney/ureter: No hydronephrosis or radiopaque kidney stones. --Left kidney/ureter: No hydronephrosis or radiopaque kidney stones. --Urinary bladder: Unremarkable. Stomach/Bowel: --Stomach/Duodenum: No hiatal hernia or other gastric abnormality. Normal duodenal course and caliber. --Small bowel: Unremarkable. --Colon: There is scattered colonic diverticula without CT  evidence for diverticulitis. --Appendix: Normal. Vascular/Lymphatic: Atherosclerotic calcification is present within the non-aneurysmal abdominal aorta, without hemodynamically significant stenosis. --No retroperitoneal lymphadenopathy. --No mesenteric lymphadenopathy. --No pelvic or inguinal lymphadenopathy. Reproductive: Unremarkable Other: No ascites or free air. There is a small fat containing umbilical hernia. Musculoskeletal. There is severe disc height loss at the L5-S1 level. There is no acute lumbar spine fracture. IMPRESSION: 1. Extensive left-sided scalp swelling with pockets of subcutaneous gas and a large scalp laceration. No acute displaced calvarial fracture. 2. No acute intracranial abnormality. 3. No acute fracture or malalignment of the cervical spine. 4. Acute T9 fracture involving the posterior elements consistent with a posterior tension band disruption. 5. No acute solid organ injury within the chest, abdomen, or pelvis. Aortic Atherosclerosis (ICD10-I70.0). Electronically Signed   By: Constance Holster M.D.   On: 07/02/2019 15:46   CT CHEST W CONTRAST  Result Date: 07/02/2019 CLINICAL DATA:  Pain status post fall from ladder. Laceration and bleeding to the back of the head. EXAM: CT HEAD WITHOUT CONTRAST CT CERVICAL SPINE WITHOUT CONTRAST CT CHEST, ABDOMEN AND PELVIS WITHOUT CONTRAST TECHNIQUE: Contiguous axial images were obtained from the base of the skull through the vertex without intravenous contrast. Multidetector CT imaging of the cervical spine was performed without intravenous contrast. Multiplanar CT image reconstructions were also generated. Multidetector CT imaging of the chest, abdomen and pelvis was performed following the standard protocol without IV contrast. COMPARISON:  CT of the pelvis dated October 22, 2018. PET-CT dated January 06, 2018. FINDINGS: CT HEAD FINDINGS Brain: No evidence of acute infarction, hemorrhage, hydrocephalus, extra-axial collection or mass  lesion/mass effect. There is a small punctate density in the anterior left frontal lobe (axial series 4, image 25). This is favored to represent a small calcification as opposed to subarachnoid hemorrhage. There are bilateral hyperdensities in the CP angles favored to represent artifact. Vascular: No hyperdense vessel or unexpected calcification. Skull: There is extensive left-sided scalp swelling with pockets of subcutaneous gas and a large scalp laceration. There is no acute displaced calvarial fracture. Sinuses/Orbits: No acute finding. Other: None. CT CERVICAL FINDINGS Alignment: Normal. Skull base and vertebrae: No acute fracture. No primary bone lesion or focal pathologic process. Soft tissues and spinal canal: No prevertebral fluid or swelling. No visible canal hematoma. Disc levels:  Mild disc height loss is noted in the cervical spine. Other: None. CT CHEST FINDINGS Cardiovascular: The heart size is unremarkable. There is a well-positioned right-sided Port-A-Cath in place. There is no evidence for thoracic aortic dissection or aneurysm. There is no large centrally located pulmonary embolism. No significant pericardial effusion. Mediastinum/Nodes: --No mediastinal or hilar lymphadenopathy. --No axillary lymphadenopathy. --No supraclavicular lymphadenopathy. --Normal thyroid gland. --The esophagus is unremarkable Lungs/Pleura: No pulmonary nodules or masses. No pleural effusion or pneumothorax. No focal airspace consolidation. No focal pleural abnormality. Musculoskeletal: There is no evidence for rib fracture. There is an acute fracture of the T9 vertebral body. This fracture extends superiorly through both endplates and posteriorly into the posterior elements including the bilateral lamina. There is no significant retropulsion at this level. CT ABDOMEN AND PELVIS FINDINGS Hepatobiliary: The liver is normal.  Normal gallbladder.There is no biliary ductal dilation. Pancreas: Normal contours without ductal  dilatation. No peripancreatic fluid collection. Spleen: Unremarkable. Adrenals/Urinary Tract: --Adrenal glands: Unremarkable. --Right kidney/ureter: No hydronephrosis or radiopaque kidney stones. --Left kidney/ureter: No hydronephrosis or radiopaque kidney stones. --Urinary bladder: Unremarkable. Stomach/Bowel: --Stomach/Duodenum: No hiatal hernia or other gastric abnormality. Normal duodenal course and caliber. --Small bowel: Unremarkable. --Colon: There is scattered colonic diverticula without CT evidence for diverticulitis. --Appendix: Normal. Vascular/Lymphatic: Atherosclerotic calcification is present within the non-aneurysmal abdominal aorta, without hemodynamically significant stenosis. --No retroperitoneal lymphadenopathy. --No mesenteric lymphadenopathy. --No pelvic or inguinal lymphadenopathy. Reproductive: Unremarkable Other: No ascites or free air. There is a small fat containing umbilical hernia. Musculoskeletal. There is severe disc height loss at the L5-S1 level. There is no acute lumbar spine fracture. IMPRESSION: 1. Extensive left-sided scalp swelling with pockets of subcutaneous gas and a large scalp laceration. No acute displaced calvarial fracture. 2. No acute intracranial abnormality. 3. No acute fracture or malalignment of the cervical spine. 4. Acute T9 fracture involving the posterior elements consistent with a posterior tension band disruption. 5. No acute solid organ injury within the chest, abdomen, or pelvis. Aortic Atherosclerosis (ICD10-I70.0). Electronically Signed   By: Constance Holster M.D.   On: 07/02/2019 15:46   CT CERVICAL SPINE WO CONTRAST  Result Date: 07/02/2019 CLINICAL DATA:  Pain status post fall from ladder. Laceration and bleeding to the back of the head. EXAM: CT HEAD WITHOUT CONTRAST CT CERVICAL SPINE WITHOUT CONTRAST CT CHEST, ABDOMEN AND PELVIS WITHOUT CONTRAST TECHNIQUE: Contiguous axial images were obtained from the base of the skull through the vertex without  intravenous contrast. Multidetector CT imaging of the cervical spine was performed without intravenous contrast. Multiplanar CT image reconstructions were also generated. Multidetector CT imaging of the chest, abdomen and pelvis was performed following the standard protocol without IV contrast. COMPARISON:  CT of the pelvis dated October 22, 2018. PET-CT dated January 06, 2018. FINDINGS: CT HEAD FINDINGS Brain: No evidence of acute infarction, hemorrhage, hydrocephalus, extra-axial collection or mass lesion/mass effect. There is a small punctate density in the anterior left frontal lobe (axial series 4, image 25). This is favored to represent a small calcification as opposed to subarachnoid hemorrhage. There are bilateral hyperdensities in the CP angles favored to represent artifact. Vascular: No hyperdense vessel or unexpected calcification. Skull: There is extensive left-sided scalp swelling with pockets of subcutaneous gas and a large scalp laceration. There is no acute displaced calvarial fracture. Sinuses/Orbits: No acute finding. Other: None. CT CERVICAL FINDINGS Alignment: Normal. Skull base and vertebrae: No acute fracture. No primary bone lesion or focal pathologic process. Soft tissues and spinal canal: No prevertebral fluid or swelling. No visible canal hematoma. Disc levels:  Mild disc height loss is noted in the cervical spine. Other: None. CT CHEST FINDINGS Cardiovascular: The heart size is unremarkable. There is a well-positioned right-sided Port-A-Cath in place. There is no evidence for thoracic aortic dissection or aneurysm. There is no large centrally located pulmonary embolism. No significant pericardial effusion. Mediastinum/Nodes: --No mediastinal or hilar lymphadenopathy. --No axillary lymphadenopathy. --No supraclavicular lymphadenopathy. --Normal thyroid gland. --The esophagus is unremarkable Lungs/Pleura: No pulmonary nodules or masses. No pleural effusion or pneumothorax. No focal airspace  consolidation. No focal pleural abnormality. Musculoskeletal: There is no evidence for rib fracture. There is an acute fracture of the T9 vertebral body. This fracture extends superiorly through both endplates and posteriorly into the posterior elements including the bilateral lamina. There is no significant retropulsion at this level. CT ABDOMEN AND  PELVIS FINDINGS Hepatobiliary: The liver is normal. Normal gallbladder.There is no biliary ductal dilation. Pancreas: Normal contours without ductal dilatation. No peripancreatic fluid collection. Spleen: Unremarkable. Adrenals/Urinary Tract: --Adrenal glands: Unremarkable. --Right kidney/ureter: No hydronephrosis or radiopaque kidney stones. --Left kidney/ureter: No hydronephrosis or radiopaque kidney stones. --Urinary bladder: Unremarkable. Stomach/Bowel: --Stomach/Duodenum: No hiatal hernia or other gastric abnormality. Normal duodenal course and caliber. --Small bowel: Unremarkable. --Colon: There is scattered colonic diverticula without CT evidence for diverticulitis. --Appendix: Normal. Vascular/Lymphatic: Atherosclerotic calcification is present within the non-aneurysmal abdominal aorta, without hemodynamically significant stenosis. --No retroperitoneal lymphadenopathy. --No mesenteric lymphadenopathy. --No pelvic or inguinal lymphadenopathy. Reproductive: Unremarkable Other: No ascites or free air. There is a small fat containing umbilical hernia. Musculoskeletal. There is severe disc height loss at the L5-S1 level. There is no acute lumbar spine fracture. IMPRESSION: 1. Extensive left-sided scalp swelling with pockets of subcutaneous gas and a large scalp laceration. No acute displaced calvarial fracture. 2. No acute intracranial abnormality. 3. No acute fracture or malalignment of the cervical spine. 4. Acute T9 fracture involving the posterior elements consistent with a posterior tension band disruption. 5. No acute solid organ injury within the chest,  abdomen, or pelvis. Aortic Atherosclerosis (ICD10-I70.0). Electronically Signed   By: Constance Holster M.D.   On: 07/02/2019 15:46   MR THORACIC SPINE WO CONTRAST  Result Date: 07/02/2019 CLINICAL DATA:  Spine fracture.  Preop. EXAM: MRI THORACIC SPINE WITHOUT CONTRAST TECHNIQUE: Multiplanar, multisequence MR imaging of the thoracic spine was performed. No intravenous contrast was administered. COMPARISON:  None. FINDINGS: Alignment: Mild degenerative anterolisthesis at T1-2. No traumatic malalignment. Exaggerated thoracic kyphosis. Vertebrae: Marrow edema within the T9 body with height loss measuring 60% when compared to T8. Minimal posterior cortex buckling. There is anterior displacement of vertebral body. T4 superior endplate with mild depression and marrow edema. No evidence of bone lesion. Cord:  No evidence of injury.  No intrathecal collection. Paraspinal and other soft tissues: Paravertebral edema at the level of T9 fracture. There is strain of intrinsic back muscles at the level of T9, extending inferiorly and leftward. Subcutaneous contusion and mild muscular strain also seen at the upper thoracic levels. Disc levels: No significant disc degeneration or degenerative impingement. IMPRESSION: 1. T9 body fracture with 60% height loss. 2. T4 superior endplate fracture with minimal depression. 3. Back muscle strain without major ligamentous disruption. Electronically Signed   By: Monte Fantasia M.D.   On: 07/02/2019 20:50   CT ABDOMEN PELVIS W CONTRAST  Result Date: 07/02/2019 CLINICAL DATA:  Pain status post fall from ladder. Laceration and bleeding to the back of the head. EXAM: CT HEAD WITHOUT CONTRAST CT CERVICAL SPINE WITHOUT CONTRAST CT CHEST, ABDOMEN AND PELVIS WITHOUT CONTRAST TECHNIQUE: Contiguous axial images were obtained from the base of the skull through the vertex without intravenous contrast. Multidetector CT imaging of the cervical spine was performed without intravenous contrast.  Multiplanar CT image reconstructions were also generated. Multidetector CT imaging of the chest, abdomen and pelvis was performed following the standard protocol without IV contrast. COMPARISON:  CT of the pelvis dated October 22, 2018. PET-CT dated January 06, 2018. FINDINGS: CT HEAD FINDINGS Brain: No evidence of acute infarction, hemorrhage, hydrocephalus, extra-axial collection or mass lesion/mass effect. There is a small punctate density in the anterior left frontal lobe (axial series 4, image 25). This is favored to represent a small calcification as opposed to subarachnoid hemorrhage. There are bilateral hyperdensities in the CP angles favored to represent artifact. Vascular: No hyperdense vessel or unexpected  calcification. Skull: There is extensive left-sided scalp swelling with pockets of subcutaneous gas and a large scalp laceration. There is no acute displaced calvarial fracture. Sinuses/Orbits: No acute finding. Other: None. CT CERVICAL FINDINGS Alignment: Normal. Skull base and vertebrae: No acute fracture. No primary bone lesion or focal pathologic process. Soft tissues and spinal canal: No prevertebral fluid or swelling. No visible canal hematoma. Disc levels:  Mild disc height loss is noted in the cervical spine. Other: None. CT CHEST FINDINGS Cardiovascular: The heart size is unremarkable. There is a well-positioned right-sided Port-A-Cath in place. There is no evidence for thoracic aortic dissection or aneurysm. There is no large centrally located pulmonary embolism. No significant pericardial effusion. Mediastinum/Nodes: --No mediastinal or hilar lymphadenopathy. --No axillary lymphadenopathy. --No supraclavicular lymphadenopathy. --Normal thyroid gland. --The esophagus is unremarkable Lungs/Pleura: No pulmonary nodules or masses. No pleural effusion or pneumothorax. No focal airspace consolidation. No focal pleural abnormality. Musculoskeletal: There is no evidence for rib fracture. There is an  acute fracture of the T9 vertebral body. This fracture extends superiorly through both endplates and posteriorly into the posterior elements including the bilateral lamina. There is no significant retropulsion at this level. CT ABDOMEN AND PELVIS FINDINGS Hepatobiliary: The liver is normal. Normal gallbladder.There is no biliary ductal dilation. Pancreas: Normal contours without ductal dilatation. No peripancreatic fluid collection. Spleen: Unremarkable. Adrenals/Urinary Tract: --Adrenal glands: Unremarkable. --Right kidney/ureter: No hydronephrosis or radiopaque kidney stones. --Left kidney/ureter: No hydronephrosis or radiopaque kidney stones. --Urinary bladder: Unremarkable. Stomach/Bowel: --Stomach/Duodenum: No hiatal hernia or other gastric abnormality. Normal duodenal course and caliber. --Small bowel: Unremarkable. --Colon: There is scattered colonic diverticula without CT evidence for diverticulitis. --Appendix: Normal. Vascular/Lymphatic: Atherosclerotic calcification is present within the non-aneurysmal abdominal aorta, without hemodynamically significant stenosis. --No retroperitoneal lymphadenopathy. --No mesenteric lymphadenopathy. --No pelvic or inguinal lymphadenopathy. Reproductive: Unremarkable Other: No ascites or free air. There is a small fat containing umbilical hernia. Musculoskeletal. There is severe disc height loss at the L5-S1 level. There is no acute lumbar spine fracture. IMPRESSION: 1. Extensive left-sided scalp swelling with pockets of subcutaneous gas and a large scalp laceration. No acute displaced calvarial fracture. 2. No acute intracranial abnormality. 3. No acute fracture or malalignment of the cervical spine. 4. Acute T9 fracture involving the posterior elements consistent with a posterior tension band disruption. 5. No acute solid organ injury within the chest, abdomen, or pelvis. Aortic Atherosclerosis (ICD10-I70.0). Electronically Signed   By: Constance Holster M.D.   On:  07/02/2019 15:46    Anti-infectives: Anti-infectives (From admission, onward)   Start     Dose/Rate Route Frequency Ordered Stop   07/02/19 2300  acyclovir (ZOVIRAX) tablet 400 mg     400 mg Oral 2 times daily 07/02/19 2245         Assessment/Plan Fall down pulldown attic stairs Large scalp laceration-repaired in the ED by Dr. Sabra Heck T4 and T9 fractures-TLSO per Dr. 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 Adjust pain meds since oxy makes her hallucinate. Working on setting up hospital bed.   LOS: 2 days    Norm Parcel , [facility] 07/04/2019, 11:17 AM Please see Amion for pager number during day hours 7:00am-4:30pm

## 2019-07-04 NOTE — Progress Notes (Signed)
Patient suffers from thoracic spine fractures which impairs their ability to perform daily activities like toileting and mobilizing in the home. She has difficulty getting in and out of bed. A hospital bed will allow patient to safely perform daily activities, and allow him to be positioned in ways not feasible with a normal bed.  Pain episodes frequently require immediate changes in body position which cannot be achieved with a normal bed.    Norm Parcel , [facility] 07/04/2019, 8:51 AM Please see Amion for pager number during day hours 7:00am-4:30pm

## 2019-07-04 NOTE — Social Work (Signed)
CSW met with pt at bedside. CSW introduced self and explained her role. CSW completed sbirt with pt.  Pt scored a 3 on the sbirt scale. Pt she drinks alcohol a few times a week. Pt stated she never has more that 1-2 shots or 1 glass of wine at a time. Pt stated she did not have a problem with alcohol use. Pt denied substance use.   CSW offered resources and pt declined.  Emeterio Reeve, Latanya Presser, [place] Social Worker [phone]

## 2019-07-04 NOTE — Progress Notes (Signed)
Physical Therapy Treatment Patient Details Name: Rachel [name] MRN: [id number] DOB: Jun 22, 1950 Today's Date: 07/04/2019    History of Present Illness Pt is a 69 y/o female with PMHx including Chronic lymphoblastic leukemia, Diverticulitis, and Lower back pain who suffered a fall down attic stairs, suffering a large scalp laceration (repaired in ED), T4 andT9 fracture. Neurosx currently assessing need for surgery.     PT Comments    Pt was seen for ck of orthostatics with no positive findings of low BP, after which she walked on RW on the hallway.  Dense instructions were given for safe mobility to sit up and to walk with RW support.  Pt was able to do a small adjustment on brace fit, comfortable and fitted correclty.  Follow up as needed for all goals of PT.   Follow Up Recommendations  Home health PT;Supervision for mobility/OOB     Equipment Recommendations  Rolling walker with 5" wheels;3in1 (PT)    Recommendations for Other Services       Precautions / Restrictions Precautions Precautions: Fall;Back Precaution Booklet Issued: Yes (comment) Required Braces or Orthoses: Spinal Brace Spinal Brace: Thoracolumbosacral orthotic(on at all times) Restrictions Weight Bearing Restrictions: No    Mobility  Bed Mobility Overal bed mobility: Needs Assistance Bed Mobility: Supine to Sit;Sit to Supine     Supine to sit: Min assist Sit to supine: Min assist      Transfers Overall transfer level: Needs assistance Equipment used: Rolling walker (2 wheeled) Transfers: Sit to/from Stand Sit to Stand: Min guard         General transfer comment: cues for hand placement  Ambulation/Gait Ambulation/Gait assistance: Min guard Gait Distance (Feet): 120 Feet Assistive device: Rolling walker (2 wheeled) Gait Pattern/deviations: Step-through pattern Gait velocity: decreased   General Gait Details: to door and back then out to hallway   Stairs              Wheelchair Mobility    Modified Rankin (Stroke Patients Only)       Balance Overall balance assessment: Needs assistance Sitting-balance support: Feet supported Sitting balance-Leahy Scale: Good Sitting balance - Comments: supervised   Standing balance support: Bilateral upper extremity supported;During functional activity Standing balance-Leahy Scale: Fair Standing balance comment: RW and cues for standing balance                            Cognition Arousal/Alertness: Awake/alert Behavior During Therapy: WFL for tasks assessed/performed Overall Cognitive Status: Within Functional Limits for tasks assessed                                        Exercises      General Comments General comments (skin integrity, edema, etc.): orthostatics were taken:  supine 102/61, sat 94% and pulse 86;  sitting 99/69, sat 95% and pulse 91;  standing 95/64, pulse 98 and sat 97%;  after gait 112/64, pulse 94, sat 96%      Pertinent Vitals/Pain Pain Assessment: 0-10 Pain Score: 5  Pain Location: back Pain Descriptors / Indicators: Guarding Pain Intervention(s): Limited activity within patient's tolerance;Monitored during session;Premedicated before session;Repositioned    Home Living                      Prior Function            PT Goals (current  goals can now be found in the care plan section) Acute Rehab PT Goals Patient Stated Goal: recover gait    Frequency    Min 5X/week      PT Plan Current plan remains appropriate    Co-evaluation              AM-PAC PT "6 Clicks" Mobility   Outcome Measure  Help needed turning from your back to your side while in a flat bed without using bedrails?: A Little Help needed moving from lying on your back to sitting on the side of a flat bed without using bedrails?: A Little Help needed moving to and from a bed to a chair (including a wheelchair)?: A Little Help needed standing up from  a chair using your arms (e.g., wheelchair or bedside chair)?: A Little Help needed to walk in hospital room?: A Little Help needed climbing 3-5 steps with a railing? : A Little 6 Click Score: 18    End of Session Equipment Utilized During Treatment: Gait belt;Back brace Activity Tolerance: Patient tolerated treatment well Patient left: with call bell/phone within reach;in bed;with bed alarm set Nurse Communication: Mobility status PT Visit Diagnosis: Difficulty in walking, not elsewhere classified (R26.2);Pain Pain - part of body: (spine)     Time: [id number] PT Time Calculation (min) (ACUTE ONLY): 23 min  Charges:  $Gait Training: 8-22 mins $Therapeutic Activity: 8-22 mins               Ramond Dial 07/04/2019, 10:56 PM  Mee Hives, PT MS Acute Rehab Dept. Number: [place]

## 2019-07-04 NOTE — Progress Notes (Signed)
Subjective: Patient reports NAEs o/n  Objective: Vital signs in last 24 hours: Temp:  [98 F (36.7 C)-99.6 F (37.6 C)] 98.8 F (37.1 C) (05/10 0443) Pulse Rate:  [74-87] 87 (05/10 0443) Resp:  [16-19] 17 (05/10 0443) BP: (92-110)/(43-70) 99/54 (05/10 0443) SpO2:  [91 %-100 %] 91 % (05/10 0443)  Intake/Output from previous day: 05/09 0701 - 05/10 0700 In: 1806.4 [P.O.:570; I.V.:1236.4] Out: 500 [Urine:500] Intake/Output this shift: No intake/output data recorded.  Awake, alert, OX3 Sitting in chair comfortably with TLSO brace on Full strength in LEs  Lab Results: Recent Labs    07/03/19 0245 07/04/19 0256  WBC 5.7 5.6  HGB 11.6* 11.0*  HCT 36.1 34.5*  PLT 114* 119*   BMET Recent Labs    07/02/19 1349 07/03/19 0245  NA 142 141  K 3.9 3.8  CL 105 108  CO2 25 22  GLUCOSE 126* 135*  BUN 12 9  CREATININE 1.12* 0.80  CALCIUM 9.3 8.4*    Studies/Results: DG Thoracic Spine 2 View  Result Date: 07/03/2019 CLINICAL DATA:  Spine fracture. EXAM: THORACIC SPINE 2 VIEWS COMPARISON:  MRI and CT 1 day prior FINDINGS: There is a right-sided Port-A-Cath in place. There has been interval placement of a brace. Again noted is a T9 vertebral body fracture better visualized on the patient's prior cross-sectional imaging. The known T4 compression fracture is not well visualized on this study. No additional acute displaced fracture identified. IMPRESSION: Again noted is a T9 compression fracture as previously described. The known T4 compression fracture is better visualized on the patient's prior cross-sectional imaging. Electronically Signed   By: Constance Holster M.D.   On: 07/03/2019 17:06   CT HEAD WO CONTRAST  Result Date: 07/02/2019 CLINICAL DATA:  Pain status post fall from ladder. Laceration and bleeding to the back of the head. EXAM: CT HEAD WITHOUT CONTRAST CT CERVICAL SPINE WITHOUT CONTRAST CT CHEST, ABDOMEN AND PELVIS WITHOUT CONTRAST TECHNIQUE: Contiguous axial images  were obtained from the base of the skull through the vertex without intravenous contrast. Multidetector CT imaging of the cervical spine was performed without intravenous contrast. Multiplanar CT image reconstructions were also generated. Multidetector CT imaging of the chest, abdomen and pelvis was performed following the standard protocol without IV contrast. COMPARISON:  CT of the pelvis dated October 22, 2018. PET-CT dated January 06, 2018. FINDINGS: CT HEAD FINDINGS Brain: No evidence of acute infarction, hemorrhage, hydrocephalus, extra-axial collection or mass lesion/mass effect. There is a small punctate density in the anterior left frontal lobe (axial series 4, image 25). This is favored to represent a small calcification as opposed to subarachnoid hemorrhage. There are bilateral hyperdensities in the CP angles favored to represent artifact. Vascular: No hyperdense vessel or unexpected calcification. Skull: There is extensive left-sided scalp swelling with pockets of subcutaneous gas and a large scalp laceration. There is no acute displaced calvarial fracture. Sinuses/Orbits: No acute finding. Other: None. CT CERVICAL FINDINGS Alignment: Normal. Skull base and vertebrae: No acute fracture. No primary bone lesion or focal pathologic process. Soft tissues and spinal canal: No prevertebral fluid or swelling. No visible canal hematoma. Disc levels:  Mild disc height loss is noted in the cervical spine. Other: None. CT CHEST FINDINGS Cardiovascular: The heart size is unremarkable. There is a well-positioned right-sided Port-A-Cath in place. There is no evidence for thoracic aortic dissection or aneurysm. There is no large centrally located pulmonary embolism. No significant pericardial effusion. Mediastinum/Nodes: --No mediastinal or hilar lymphadenopathy. --No axillary lymphadenopathy. --No supraclavicular lymphadenopathy. --  Normal thyroid gland. --The esophagus is unremarkable Lungs/Pleura: No pulmonary  nodules or masses. No pleural effusion or pneumothorax. No focal airspace consolidation. No focal pleural abnormality. Musculoskeletal: There is no evidence for rib fracture. There is an acute fracture of the T9 vertebral body. This fracture extends superiorly through both endplates and posteriorly into the posterior elements including the bilateral lamina. There is no significant retropulsion at this level. CT ABDOMEN AND PELVIS FINDINGS Hepatobiliary: The liver is normal. Normal gallbladder.There is no biliary ductal dilation. Pancreas: Normal contours without ductal dilatation. No peripancreatic fluid collection. Spleen: Unremarkable. Adrenals/Urinary Tract: --Adrenal glands: Unremarkable. --Right kidney/ureter: No hydronephrosis or radiopaque kidney stones. --Left kidney/ureter: No hydronephrosis or radiopaque kidney stones. --Urinary bladder: Unremarkable. Stomach/Bowel: --Stomach/Duodenum: No hiatal hernia or other gastric abnormality. Normal duodenal course and caliber. --Small bowel: Unremarkable. --Colon: There is scattered colonic diverticula without CT evidence for diverticulitis. --Appendix: Normal. Vascular/Lymphatic: Atherosclerotic calcification is present within the non-aneurysmal abdominal aorta, without hemodynamically significant stenosis. --No retroperitoneal lymphadenopathy. --No mesenteric lymphadenopathy. --No pelvic or inguinal lymphadenopathy. Reproductive: Unremarkable Other: No ascites or free air. There is a small fat containing umbilical hernia. Musculoskeletal. There is severe disc height loss at the L5-S1 level. There is no acute lumbar spine fracture. IMPRESSION: 1. Extensive left-sided scalp swelling with pockets of subcutaneous gas and a large scalp laceration. No acute displaced calvarial fracture. 2. No acute intracranial abnormality. 3. No acute fracture or malalignment of the cervical spine. 4. Acute T9 fracture involving the posterior elements consistent with a posterior  tension band disruption. 5. No acute solid organ injury within the chest, abdomen, or pelvis. Aortic Atherosclerosis (ICD10-I70.0). Electronically Signed   By: Constance Holster M.D.   On: 07/02/2019 15:46   CT CHEST W CONTRAST  Result Date: 07/02/2019 CLINICAL DATA:  Pain status post fall from ladder. Laceration and bleeding to the back of the head. EXAM: CT HEAD WITHOUT CONTRAST CT CERVICAL SPINE WITHOUT CONTRAST CT CHEST, ABDOMEN AND PELVIS WITHOUT CONTRAST TECHNIQUE: Contiguous axial images were obtained from the base of the skull through the vertex without intravenous contrast. Multidetector CT imaging of the cervical spine was performed without intravenous contrast. Multiplanar CT image reconstructions were also generated. Multidetector CT imaging of the chest, abdomen and pelvis was performed following the standard protocol without IV contrast. COMPARISON:  CT of the pelvis dated October 22, 2018. PET-CT dated January 06, 2018. FINDINGS: CT HEAD FINDINGS Brain: No evidence of acute infarction, hemorrhage, hydrocephalus, extra-axial collection or mass lesion/mass effect. There is a small punctate density in the anterior left frontal lobe (axial series 4, image 25). This is favored to represent a small calcification as opposed to subarachnoid hemorrhage. There are bilateral hyperdensities in the CP angles favored to represent artifact. Vascular: No hyperdense vessel or unexpected calcification. Skull: There is extensive left-sided scalp swelling with pockets of subcutaneous gas and a large scalp laceration. There is no acute displaced calvarial fracture. Sinuses/Orbits: No acute finding. Other: None. CT CERVICAL FINDINGS Alignment: Normal. Skull base and vertebrae: No acute fracture. No primary bone lesion or focal pathologic process. Soft tissues and spinal canal: No prevertebral fluid or swelling. No visible canal hematoma. Disc levels:  Mild disc height loss is noted in the cervical spine. Other: None.  CT CHEST FINDINGS Cardiovascular: The heart size is unremarkable. There is a well-positioned right-sided Port-A-Cath in place. There is no evidence for thoracic aortic dissection or aneurysm. There is no large centrally located pulmonary embolism. No significant pericardial effusion. Mediastinum/Nodes: --No mediastinal or hilar lymphadenopathy. --  No axillary lymphadenopathy. --No supraclavicular lymphadenopathy. --Normal thyroid gland. --The esophagus is unremarkable Lungs/Pleura: No pulmonary nodules or masses. No pleural effusion or pneumothorax. No focal airspace consolidation. No focal pleural abnormality. Musculoskeletal: There is no evidence for rib fracture. There is an acute fracture of the T9 vertebral body. This fracture extends superiorly through both endplates and posteriorly into the posterior elements including the bilateral lamina. There is no significant retropulsion at this level. CT ABDOMEN AND PELVIS FINDINGS Hepatobiliary: The liver is normal. Normal gallbladder.There is no biliary ductal dilation. Pancreas: Normal contours without ductal dilatation. No peripancreatic fluid collection. Spleen: Unremarkable. Adrenals/Urinary Tract: --Adrenal glands: Unremarkable. --Right kidney/ureter: No hydronephrosis or radiopaque kidney stones. --Left kidney/ureter: No hydronephrosis or radiopaque kidney stones. --Urinary bladder: Unremarkable. Stomach/Bowel: --Stomach/Duodenum: No hiatal hernia or other gastric abnormality. Normal duodenal course and caliber. --Small bowel: Unremarkable. --Colon: There is scattered colonic diverticula without CT evidence for diverticulitis. --Appendix: Normal. Vascular/Lymphatic: Atherosclerotic calcification is present within the non-aneurysmal abdominal aorta, without hemodynamically significant stenosis. --No retroperitoneal lymphadenopathy. --No mesenteric lymphadenopathy. --No pelvic or inguinal lymphadenopathy. Reproductive: Unremarkable Other: No ascites or free air.  There is a small fat containing umbilical hernia. Musculoskeletal. There is severe disc height loss at the L5-S1 level. There is no acute lumbar spine fracture. IMPRESSION: 1. Extensive left-sided scalp swelling with pockets of subcutaneous gas and a large scalp laceration. No acute displaced calvarial fracture. 2. No acute intracranial abnormality. 3. No acute fracture or malalignment of the cervical spine. 4. Acute T9 fracture involving the posterior elements consistent with a posterior tension band disruption. 5. No acute solid organ injury within the chest, abdomen, or pelvis. Aortic Atherosclerosis (ICD10-I70.0). Electronically Signed   By: Constance Holster M.D.   On: 07/02/2019 15:46   CT CERVICAL SPINE WO CONTRAST  Result Date: 07/02/2019 CLINICAL DATA:  Pain status post fall from ladder. Laceration and bleeding to the back of the head. EXAM: CT HEAD WITHOUT CONTRAST CT CERVICAL SPINE WITHOUT CONTRAST CT CHEST, ABDOMEN AND PELVIS WITHOUT CONTRAST TECHNIQUE: Contiguous axial images were obtained from the base of the skull through the vertex without intravenous contrast. Multidetector CT imaging of the cervical spine was performed without intravenous contrast. Multiplanar CT image reconstructions were also generated. Multidetector CT imaging of the chest, abdomen and pelvis was performed following the standard protocol without IV contrast. COMPARISON:  CT of the pelvis dated October 22, 2018. PET-CT dated January 06, 2018. FINDINGS: CT HEAD FINDINGS Brain: No evidence of acute infarction, hemorrhage, hydrocephalus, extra-axial collection or mass lesion/mass effect. There is a small punctate density in the anterior left frontal lobe (axial series 4, image 25). This is favored to represent a small calcification as opposed to subarachnoid hemorrhage. There are bilateral hyperdensities in the CP angles favored to represent artifact. Vascular: No hyperdense vessel or unexpected calcification. Skull: There is  extensive left-sided scalp swelling with pockets of subcutaneous gas and a large scalp laceration. There is no acute displaced calvarial fracture. Sinuses/Orbits: No acute finding. Other: None. CT CERVICAL FINDINGS Alignment: Normal. Skull base and vertebrae: No acute fracture. No primary bone lesion or focal pathologic process. Soft tissues and spinal canal: No prevertebral fluid or swelling. No visible canal hematoma. Disc levels:  Mild disc height loss is noted in the cervical spine. Other: None. CT CHEST FINDINGS Cardiovascular: The heart size is unremarkable. There is a well-positioned right-sided Port-A-Cath in place. There is no evidence for thoracic aortic dissection or aneurysm. There is no large centrally located pulmonary embolism. No significant pericardial effusion.  Mediastinum/Nodes: --No mediastinal or hilar lymphadenopathy. --No axillary lymphadenopathy. --No supraclavicular lymphadenopathy. --Normal thyroid gland. --The esophagus is unremarkable Lungs/Pleura: No pulmonary nodules or masses. No pleural effusion or pneumothorax. No focal airspace consolidation. No focal pleural abnormality. Musculoskeletal: There is no evidence for rib fracture. There is an acute fracture of the T9 vertebral body. This fracture extends superiorly through both endplates and posteriorly into the posterior elements including the bilateral lamina. There is no significant retropulsion at this level. CT ABDOMEN AND PELVIS FINDINGS Hepatobiliary: The liver is normal. Normal gallbladder.There is no biliary ductal dilation. Pancreas: Normal contours without ductal dilatation. No peripancreatic fluid collection. Spleen: Unremarkable. Adrenals/Urinary Tract: --Adrenal glands: Unremarkable. --Right kidney/ureter: No hydronephrosis or radiopaque kidney stones. --Left kidney/ureter: No hydronephrosis or radiopaque kidney stones. --Urinary bladder: Unremarkable. Stomach/Bowel: --Stomach/Duodenum: No hiatal hernia or other gastric  abnormality. Normal duodenal course and caliber. --Small bowel: Unremarkable. --Colon: There is scattered colonic diverticula without CT evidence for diverticulitis. --Appendix: Normal. Vascular/Lymphatic: Atherosclerotic calcification is present within the non-aneurysmal abdominal aorta, without hemodynamically significant stenosis. --No retroperitoneal lymphadenopathy. --No mesenteric lymphadenopathy. --No pelvic or inguinal lymphadenopathy. Reproductive: Unremarkable Other: No ascites or free air. There is a small fat containing umbilical hernia. Musculoskeletal. There is severe disc height loss at the L5-S1 level. There is no acute lumbar spine fracture. IMPRESSION: 1. Extensive left-sided scalp swelling with pockets of subcutaneous gas and a large scalp laceration. No acute displaced calvarial fracture. 2. No acute intracranial abnormality. 3. No acute fracture or malalignment of the cervical spine. 4. Acute T9 fracture involving the posterior elements consistent with a posterior tension band disruption. 5. No acute solid organ injury within the chest, abdomen, or pelvis. Aortic Atherosclerosis (ICD10-I70.0). Electronically Signed   By: Constance Holster M.D.   On: 07/02/2019 15:46   MR THORACIC SPINE WO CONTRAST  Result Date: 07/02/2019 CLINICAL DATA:  Spine fracture.  Preop. EXAM: MRI THORACIC SPINE WITHOUT CONTRAST TECHNIQUE: Multiplanar, multisequence MR imaging of the thoracic spine was performed. No intravenous contrast was administered. COMPARISON:  None. FINDINGS: Alignment: Mild degenerative anterolisthesis at T1-2. No traumatic malalignment. Exaggerated thoracic kyphosis. Vertebrae: Marrow edema within the T9 body with height loss measuring 60% when compared to T8. Minimal posterior cortex buckling. There is anterior displacement of vertebral body. T4 superior endplate with mild depression and marrow edema. No evidence of bone lesion. Cord:  No evidence of injury.  No intrathecal collection.  Paraspinal and other soft tissues: Paravertebral edema at the level of T9 fracture. There is strain of intrinsic back muscles at the level of T9, extending inferiorly and leftward. Subcutaneous contusion and mild muscular strain also seen at the upper thoracic levels. Disc levels: No significant disc degeneration or degenerative impingement. IMPRESSION: 1. T9 body fracture with 60% height loss. 2. T4 superior endplate fracture with minimal depression. 3. Back muscle strain without major ligamentous disruption. Electronically Signed   By: Monte Fantasia M.D.   On: 07/02/2019 20:50   CT ABDOMEN PELVIS W CONTRAST  Result Date: 07/02/2019 CLINICAL DATA:  Pain status post fall from ladder. Laceration and bleeding to the back of the head. EXAM: CT HEAD WITHOUT CONTRAST CT CERVICAL SPINE WITHOUT CONTRAST CT CHEST, ABDOMEN AND PELVIS WITHOUT CONTRAST TECHNIQUE: Contiguous axial images were obtained from the base of the skull through the vertex without intravenous contrast. Multidetector CT imaging of the cervical spine was performed without intravenous contrast. Multiplanar CT image reconstructions were also generated. Multidetector CT imaging of the chest, abdomen and pelvis was performed following the standard protocol  without IV contrast. COMPARISON:  CT of the pelvis dated October 22, 2018. PET-CT dated January 06, 2018. FINDINGS: CT HEAD FINDINGS Brain: No evidence of acute infarction, hemorrhage, hydrocephalus, extra-axial collection or mass lesion/mass effect. There is a small punctate density in the anterior left frontal lobe (axial series 4, image 25). This is favored to represent a small calcification as opposed to subarachnoid hemorrhage. There are bilateral hyperdensities in the CP angles favored to represent artifact. Vascular: No hyperdense vessel or unexpected calcification. Skull: There is extensive left-sided scalp swelling with pockets of subcutaneous gas and a large scalp laceration. There is no acute  displaced calvarial fracture. Sinuses/Orbits: No acute finding. Other: None. CT CERVICAL FINDINGS Alignment: Normal. Skull base and vertebrae: No acute fracture. No primary bone lesion or focal pathologic process. Soft tissues and spinal canal: No prevertebral fluid or swelling. No visible canal hematoma. Disc levels:  Mild disc height loss is noted in the cervical spine. Other: None. CT CHEST FINDINGS Cardiovascular: The heart size is unremarkable. There is a well-positioned right-sided Port-A-Cath in place. There is no evidence for thoracic aortic dissection or aneurysm. There is no large centrally located pulmonary embolism. No significant pericardial effusion. Mediastinum/Nodes: --No mediastinal or hilar lymphadenopathy. --No axillary lymphadenopathy. --No supraclavicular lymphadenopathy. --Normal thyroid gland. --The esophagus is unremarkable Lungs/Pleura: No pulmonary nodules or masses. No pleural effusion or pneumothorax. No focal airspace consolidation. No focal pleural abnormality. Musculoskeletal: There is no evidence for rib fracture. There is an acute fracture of the T9 vertebral body. This fracture extends superiorly through both endplates and posteriorly into the posterior elements including the bilateral lamina. There is no significant retropulsion at this level. CT ABDOMEN AND PELVIS FINDINGS Hepatobiliary: The liver is normal. Normal gallbladder.There is no biliary ductal dilation. Pancreas: Normal contours without ductal dilatation. No peripancreatic fluid collection. Spleen: Unremarkable. Adrenals/Urinary Tract: --Adrenal glands: Unremarkable. --Right kidney/ureter: No hydronephrosis or radiopaque kidney stones. --Left kidney/ureter: No hydronephrosis or radiopaque kidney stones. --Urinary bladder: Unremarkable. Stomach/Bowel: --Stomach/Duodenum: No hiatal hernia or other gastric abnormality. Normal duodenal course and caliber. --Small bowel: Unremarkable. --Colon: There is scattered colonic  diverticula without CT evidence for diverticulitis. --Appendix: Normal. Vascular/Lymphatic: Atherosclerotic calcification is present within the non-aneurysmal abdominal aorta, without hemodynamically significant stenosis. --No retroperitoneal lymphadenopathy. --No mesenteric lymphadenopathy. --No pelvic or inguinal lymphadenopathy. Reproductive: Unremarkable Other: No ascites or free air. There is a small fat containing umbilical hernia. Musculoskeletal. There is severe disc height loss at the L5-S1 level. There is no acute lumbar spine fracture. IMPRESSION: 1. Extensive left-sided scalp swelling with pockets of subcutaneous gas and a large scalp laceration. No acute displaced calvarial fracture. 2. No acute intracranial abnormality. 3. No acute fracture or malalignment of the cervical spine. 4. Acute T9 fracture involving the posterior elements consistent with a posterior tension band disruption. 5. No acute solid organ injury within the chest, abdomen, or pelvis. Aortic Atherosclerosis (ICD10-I70.0). Electronically Signed   By: Constance Holster M.D.   On: 07/02/2019 15:46    Assessment/Plan: T9 compression fracture - continue mobilization with PT, TLSO brace when OOB. - I discussed treatment plan with patient and husband.  It appears that her pain is significantly improving.  Kyphoplasty is reserved for poor pain control or difficulty with mobilization, so at this point I would not recommend kyphoplasty but it certainly would be an option in the future if her progress halts or pain worsens. - she will need to f/u in 2 weeks with another standing x-ray of her T-spine  All questions and  concerns were answered.   Vallarie Mare 07/04/2019, 9:04 AM

## 2019-07-04 NOTE — Progress Notes (Addendum)
[facility]  [phone] [phone]  ID: Rachel [name] OB: 1950-05-11 MR#: [id number] PCP: Leeroy Cha, MD Donjuan Robison, Virgie Dad, MD as Consulting Physician (Oncology) [name], MD as Consulting Physician (Obstetrics and Gynecology) OTHER MD:  CHIEF COMPLAINT: Chronic lymphocytic leukemia  CURRENT TREATMENT: Venetoclax, ibrutinib  INTERVAL HISTORY: She has been admitted to the hospital with back pain. She fell down her pulldown stairs from the attic, struck her head, and had a laceration.  Her large scalp laceration was repaired and she was also found to have a T9 fracture.  She has been seen by neurosurgery who was recommended mobilization with PT and TLSO brace when she is out of bed.  REVIEW OF SYSTEMS: Reports that her pain is better today.  She is not recurrent bleeding.  Has multiple bruises.  States that prior to her fall, she thinks she had a loss of consciousness with lack of control of her legs.  PAST MEDICAL HISTORY: 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   Diverticulitis    Leukemia ([place])    Lower back pain    PAST SURGICAL HISTORY: Past Surgical History:  Procedure Laterality Date   BREAST SURGERY  2015   left breast bx   BUNIONECTOMY     right foot   lining of uterus removed     OTHER SURGICAL HISTORY     removal of uterine lining   TONSILLECTOMY     FAMILY HISTORY Family History  Problem Relation Age of Onset   Depression Son    GYNECOLOGIC HISTORY: Menarche age 34, first live birth age 27, she is [place] P3. She stopped having periods in 1999 after endometrial stripping. She continues on hormone replacement.  Social History   Tobacco Use   Smoking status: Never Smoker   Smokeless tobacco: Never Used  Substance Use Topics   Alcohol use: Yes    Alcohol/week: 0.0 standard drinks    Comment: socially   Drug use: No   SOCIAL HISTORY: Her husband  [name] is retired from [employer]. Her son [name] lives in [place]; he works for a [employer]. A second son lives in [place] and has two children. Son [name] lives in [place]. She has 8 grandchildren, the oldest is 71 and the youngest is 71.   ADVANCED DIRECTIVES: In place  Colonoscopy: 12/2018 (Dr. Collene Mares), repeat in 10 years             PAP: December 2012/ Jodi Mourning             Bone density: Due             Lipid panel: per Dr Drema Dallas  Allergies  Allergen Reactions   Penicillins Anaphylaxis    "stopped breathing" "drops my heartbeat" "I just pass out"   Current Facility-Administered Medications  Medication Dose Route Frequency Provider Last Rate Last Admin   acetaminophen (TYLENOL) tablet 500 mg  500 mg Oral Q6H Barkley Boards R, PA-C   500 mg at 07/04/19 1032   acyclovir (ZOVIRAX) tablet 400 mg  400 mg Oral BID Georganna Skeans, MD   400 mg at 07/04/19 1032   allopurinol (ZYLOPRIM) tablet 300 mg  300 mg Oral Daily Georganna Skeans, MD   300 mg at 07/04/19 1032   azelastine (ASTELIN) 0.1 % nasal spray 1 spray  1 spray Each Nare BID Georganna Skeans, MD   1 spray at 07/04/19 1033   Chlorhexidine Gluconate Cloth 2 %  PADS 6 each  6 each Topical Daily Norm Parcel, PA-C       docusate sodium (COLACE) capsule 100 mg  100 mg Oral BID Georganna Skeans, MD   100 mg at 07/04/19 1032   HYDROmorphone (DILAUDID) injection 0.5 mg  0.5 mg Intravenous Q3H PRN Georganna Skeans, MD   0.5 mg at 07/02/19 2311   Ibrutinib TABS 280 mg  280 mg Oral Q breakfast Georganna Skeans, MD   280 mg at 07/04/19 0749   methocarbamol (ROBAXIN) tablet 500 mg  500 mg Oral TID Norm Parcel, PA-C   500 mg at 07/04/19 1032   montelukast (SINGULAIR) tablet 10 mg  10 mg Oral QHS Georganna Skeans, MD   10 mg at 07/03/19 2109   ondansetron (ZOFRAN-ODT) disintegrating tablet 4 mg  4 mg Oral Q6H PRN Georganna Skeans, MD       Or   ondansetron [facility]) injection 4 mg  4 mg Intravenous Q6H PRN Georganna Skeans, MD        oxyCODONE (Oxy IR/ROXICODONE) immediate release tablet 2.5 mg  2.5 mg Oral Q4H PRN Norm Parcel, PA-C       polyethylene glycol (MIRALAX / GLYCOLAX) packet 17 g  17 g Oral Daily Norm Parcel, PA-C   17 g at 07/04/19 1031   progesterone (PROMETRIUM) capsule 200 mg  200 mg Oral QHS Georganna Skeans, MD   200 mg at 07/03/19 2109   traMADol (ULTRAM) tablet 50-100 mg  50-100 mg Oral Q6H PRN Norm Parcel, PA-C       traZODone (DESYREL) tablet 100 mg  100 mg Oral QHS Georganna Skeans, MD   100 mg at 07/03/19 2109   venetoclax TABS 100 mg  100 mg Oral Q breakfast Georganna Skeans, MD   100 mg at 07/04/19 0748   OBJECTIVE: General: Awake and alert, no distress Vitals:   07/04/19 0443 07/04/19 1321  BP: (!) 99/54 (!) 98/52  Pulse: 87 76  Resp: 17 18  Temp: 98.8 F (37.1 C) 98.3 F (36.8 C)  SpO2: 91% 93%   Body mass index is 24.66 kg/m. ECOG FS:1 - Symptomatic but completely ambulatory Ocular: Sclerae unicteric, pupils equal, round and reactive to light Ear-nose-throat: Oropharynx clear, dentition fair Lymphatic: No cervical or supraclavicular adenopathy Lungs no rales or rhonchi, good excursion bilaterally Heart regular rate and rhythm, no murmur appreciated Abd soft, nontender, positive bowel sounds MSK no focal spinal tenderness, no joint edema Neuro: non-focal, well-oriented, appropriate affect Skin: Multiple ecchymoses noted on her arms and face  LAB RESULTS: CMP     Component Value Date/Time   NA 141 07/03/2019 0245   NA 140 12/31/2016 1406   K 3.8 07/03/2019 0245   K 3.6 12/31/2016 1406   CL 108 07/03/2019 0245   CL 101 12/15/2011 1322   CO2 22 07/03/2019 0245   CO2 24 12/31/2016 1406   GLUCOSE 135 (H) 07/03/2019 0245   GLUCOSE 125 12/31/2016 1406   GLUCOSE 83 12/15/2011 1322   BUN 9 07/03/2019 0245   BUN 12.8 12/31/2016 1406   CREATININE 0.80 07/03/2019 0245   CREATININE 0.82 12/15/2018 1425   CREATININE 0.9 12/31/2016 1406   CALCIUM 8.4 (L) 07/03/2019 0245    CALCIUM 8.8 12/31/2016 1406   PROT 6.2 (L) 07/02/2019 1349   PROT 6.0 (L) 12/31/2016 1406   ALBUMIN 3.8 07/02/2019 1349   ALBUMIN 3.6 12/31/2016 1406   AST 49 (H) 07/02/2019 1349   AST 18 12/15/2018 1425   AST 18  12/31/2016 1406   ALT 48 (H) 07/02/2019 1349   ALT 17 12/15/2018 1425   ALT 11 12/31/2016 1406   ALKPHOS 106 07/02/2019 1349   ALKPHOS 71 12/31/2016 1406   BILITOT 0.7 07/02/2019 1349   BILITOT 0.3 12/15/2018 1425   BILITOT 0.49 12/31/2016 1406   GFRNONAA >60 07/03/2019 0245   GFRNONAA >60 12/15/2018 1425   GFRAA >60 07/03/2019 0245   GFRAA >60 12/15/2018 1425   INo results found for: SPEP, UPEP Lab Results  Component Value Date   WBC 5.6 07/04/2019   NEUTROABS 2.1 04/25/2019   HGB 11.0 (L) 07/04/2019   HCT 34.5 (L) 07/04/2019   MCV 89.8 07/04/2019   PLT 119 (L) 07/04/2019   @LASTCHEMISTRY @ No results found for: LABCA2 No components found for: LABCA125 Recent Labs  Lab 07/02/19 1349  INR 0.9   Urinalysis    Component Value Date/Time   COLORURINE YELLOW 07/02/2019 1628   APPEARANCEUR CLEAR 07/02/2019 1628   LABSPEC >1.046 (H) 07/02/2019 1628   LABSPEC 1.010 06/25/2015 1345   PHURINE 7.0 07/02/2019 1628   GLUCOSEU NEGATIVE 07/02/2019 1628   GLUCOSEU Negative 06/25/2015 1345   HGBUR SMALL (A) 07/02/2019 1628   BILIRUBINUR NEGATIVE 07/02/2019 1628   BILIRUBINUR Negative 06/25/2015 1345   KETONESUR 5 (A) 07/02/2019 1628   PROTEINUR NEGATIVE 07/02/2019 1628   UROBILINOGEN 0.2 06/25/2015 1345   NITRITE NEGATIVE 07/02/2019 1628   LEUKOCYTESUR NEGATIVE 07/02/2019 1628   LEUKOCYTESUR Negative 06/25/2015 1345   STUDIES: DG Thoracic Spine 2 View  Result Date: 07/03/2019 CLINICAL DATA:  Spine fracture. EXAM: THORACIC SPINE 2 VIEWS COMPARISON:  MRI and CT 1 day prior FINDINGS: There is a right-sided Port-A-Cath in place. There has been interval placement of a brace. Again noted is a T9 vertebral body fracture better visualized on the patient's prior  cross-sectional imaging. The known T4 compression fracture is not well visualized on this study. No additional acute displaced fracture identified. IMPRESSION: Again noted is a T9 compression fracture as previously described. The known T4 compression fracture is better visualized on the patient's prior cross-sectional imaging. Electronically Signed   By: Constance Holster M.D.   On: 07/03/2019 17:06   CT HEAD WO CONTRAST  Result Date: 07/02/2019 CLINICAL DATA:  Pain status post fall from ladder. Laceration and bleeding to the back of the head. EXAM: CT HEAD WITHOUT CONTRAST CT CERVICAL SPINE WITHOUT CONTRAST CT CHEST, ABDOMEN AND PELVIS WITHOUT CONTRAST TECHNIQUE: Contiguous axial images were obtained from the base of the skull through the vertex without intravenous contrast. Multidetector CT imaging of the cervical spine was performed without intravenous contrast. Multiplanar CT image reconstructions were also generated. Multidetector CT imaging of the chest, abdomen and pelvis was performed following the standard protocol without IV contrast. COMPARISON:  CT of the pelvis dated October 22, 2018. PET-CT dated January 06, 2018. FINDINGS: CT HEAD FINDINGS Brain: No evidence of acute infarction, hemorrhage, hydrocephalus, extra-axial collection or mass lesion/mass effect. There is a small punctate density in the anterior left frontal lobe (axial series 4, image 25). This is favored to represent a small calcification as opposed to subarachnoid hemorrhage. There are bilateral hyperdensities in the CP angles favored to represent artifact. Vascular: No hyperdense vessel or unexpected calcification. Skull: There is extensive left-sided scalp swelling with pockets of subcutaneous gas and a large scalp laceration. There is no acute displaced calvarial fracture. Sinuses/Orbits: No acute finding. Other: None. CT CERVICAL FINDINGS Alignment: Normal. Skull base and vertebrae: No acute fracture. No primary bone lesion  or  focal pathologic process. Soft tissues and spinal canal: No prevertebral fluid or swelling. No visible canal hematoma. Disc levels:  Mild disc height loss is noted in the cervical spine. Other: None. CT CHEST FINDINGS Cardiovascular: The heart size is unremarkable. There is a well-positioned right-sided Port-A-Cath in place. There is no evidence for thoracic aortic dissection or aneurysm. There is no large centrally located pulmonary embolism. No significant pericardial effusion. Mediastinum/Nodes: --No mediastinal or hilar lymphadenopathy. --No axillary lymphadenopathy. --No supraclavicular lymphadenopathy. --Normal thyroid gland. --The esophagus is unremarkable Lungs/Pleura: No pulmonary nodules or masses. No pleural effusion or pneumothorax. No focal airspace consolidation. No focal pleural abnormality. Musculoskeletal: There is no evidence for rib fracture. There is an acute fracture of the T9 vertebral body. This fracture extends superiorly through both endplates and posteriorly into the posterior elements including the bilateral lamina. There is no significant retropulsion at this level. CT ABDOMEN AND PELVIS FINDINGS Hepatobiliary: The liver is normal. Normal gallbladder.There is no biliary ductal dilation. Pancreas: Normal contours without ductal dilatation. No peripancreatic fluid collection. Spleen: Unremarkable. Adrenals/Urinary Tract: --Adrenal glands: Unremarkable. --Right kidney/ureter: No hydronephrosis or radiopaque kidney stones. --Left kidney/ureter: No hydronephrosis or radiopaque kidney stones. --Urinary bladder: Unremarkable. Stomach/Bowel: --Stomach/Duodenum: No hiatal hernia or other gastric abnormality. Normal duodenal course and caliber. --Small bowel: Unremarkable. --Colon: There is scattered colonic diverticula without CT evidence for diverticulitis. --Appendix: Normal. Vascular/Lymphatic: Atherosclerotic calcification is present within the non-aneurysmal abdominal aorta, without  hemodynamically significant stenosis. --No retroperitoneal lymphadenopathy. --No mesenteric lymphadenopathy. --No pelvic or inguinal lymphadenopathy. Reproductive: Unremarkable Other: No ascites or free air. There is a small fat containing umbilical hernia. Musculoskeletal. There is severe disc height loss at the L5-S1 level. There is no acute lumbar spine fracture. IMPRESSION: 1. Extensive left-sided scalp swelling with pockets of subcutaneous gas and a large scalp laceration. No acute displaced calvarial fracture. 2. No acute intracranial abnormality. 3. No acute fracture or malalignment of the cervical spine. 4. Acute T9 fracture involving the posterior elements consistent with a posterior tension band disruption. 5. No acute solid organ injury within the chest, abdomen, or pelvis. Aortic Atherosclerosis (ICD10-I70.0). Electronically Signed   By: Constance Holster M.D.   On: 07/02/2019 15:46   CT CHEST W CONTRAST  Result Date: 07/02/2019 CLINICAL DATA:  Pain status post fall from ladder. Laceration and bleeding to the back of the head. EXAM: CT HEAD WITHOUT CONTRAST CT CERVICAL SPINE WITHOUT CONTRAST CT CHEST, ABDOMEN AND PELVIS WITHOUT CONTRAST TECHNIQUE: Contiguous axial images were obtained from the base of the skull through the vertex without intravenous contrast. Multidetector CT imaging of the cervical spine was performed without intravenous contrast. Multiplanar CT image reconstructions were also generated. Multidetector CT imaging of the chest, abdomen and pelvis was performed following the standard protocol without IV contrast. COMPARISON:  CT of the pelvis dated October 22, 2018. PET-CT dated January 06, 2018. FINDINGS: CT HEAD FINDINGS Brain: No evidence of acute infarction, hemorrhage, hydrocephalus, extra-axial collection or mass lesion/mass effect. There is a small punctate density in the anterior left frontal lobe (axial series 4, image 25). This is favored to represent a small calcification as  opposed to subarachnoid hemorrhage. There are bilateral hyperdensities in the CP angles favored to represent artifact. Vascular: No hyperdense vessel or unexpected calcification. Skull: There is extensive left-sided scalp swelling with pockets of subcutaneous gas and a large scalp laceration. There is no acute displaced calvarial fracture. Sinuses/Orbits: No acute finding. Other: None. CT CERVICAL FINDINGS Alignment: Normal. Skull base and vertebrae: No  acute fracture. No primary bone lesion or focal pathologic process. Soft tissues and spinal canal: No prevertebral fluid or swelling. No visible canal hematoma. Disc levels:  Mild disc height loss is noted in the cervical spine. Other: None. CT CHEST FINDINGS Cardiovascular: The heart size is unremarkable. There is a well-positioned right-sided Port-A-Cath in place. There is no evidence for thoracic aortic dissection or aneurysm. There is no large centrally located pulmonary embolism. No significant pericardial effusion. Mediastinum/Nodes: --No mediastinal or hilar lymphadenopathy. --No axillary lymphadenopathy. --No supraclavicular lymphadenopathy. --Normal thyroid gland. --The esophagus is unremarkable Lungs/Pleura: No pulmonary nodules or masses. No pleural effusion or pneumothorax. No focal airspace consolidation. No focal pleural abnormality. Musculoskeletal: There is no evidence for rib fracture. There is an acute fracture of the T9 vertebral body. This fracture extends superiorly through both endplates and posteriorly into the posterior elements including the bilateral lamina. There is no significant retropulsion at this level. CT ABDOMEN AND PELVIS FINDINGS Hepatobiliary: The liver is normal. Normal gallbladder.There is no biliary ductal dilation. Pancreas: Normal contours without ductal dilatation. No peripancreatic fluid collection. Spleen: Unremarkable. Adrenals/Urinary Tract: --Adrenal glands: Unremarkable. --Right kidney/ureter: No hydronephrosis or  radiopaque kidney stones. --Left kidney/ureter: No hydronephrosis or radiopaque kidney stones. --Urinary bladder: Unremarkable. Stomach/Bowel: --Stomach/Duodenum: No hiatal hernia or other gastric abnormality. Normal duodenal course and caliber. --Small bowel: Unremarkable. --Colon: There is scattered colonic diverticula without CT evidence for diverticulitis. --Appendix: Normal. Vascular/Lymphatic: Atherosclerotic calcification is present within the non-aneurysmal abdominal aorta, without hemodynamically significant stenosis. --No retroperitoneal lymphadenopathy. --No mesenteric lymphadenopathy. --No pelvic or inguinal lymphadenopathy. Reproductive: Unremarkable Other: No ascites or free air. There is a small fat containing umbilical hernia. Musculoskeletal. There is severe disc height loss at the L5-S1 level. There is no acute lumbar spine fracture. IMPRESSION: 1. Extensive left-sided scalp swelling with pockets of subcutaneous gas and a large scalp laceration. No acute displaced calvarial fracture. 2. No acute intracranial abnormality. 3. No acute fracture or malalignment of the cervical spine. 4. Acute T9 fracture involving the posterior elements consistent with a posterior tension band disruption. 5. No acute solid organ injury within the chest, abdomen, or pelvis. Aortic Atherosclerosis (ICD10-I70.0). Electronically Signed   By: Constance Holster M.D.   On: 07/02/2019 15:46   CT CERVICAL SPINE WO CONTRAST  Result Date: 07/02/2019 CLINICAL DATA:  Pain status post fall from ladder. Laceration and bleeding to the back of the head. EXAM: CT HEAD WITHOUT CONTRAST CT CERVICAL SPINE WITHOUT CONTRAST CT CHEST, ABDOMEN AND PELVIS WITHOUT CONTRAST TECHNIQUE: Contiguous axial images were obtained from the base of the skull through the vertex without intravenous contrast. Multidetector CT imaging of the cervical spine was performed without intravenous contrast. Multiplanar CT image reconstructions were also generated.  Multidetector CT imaging of the chest, abdomen and pelvis was performed following the standard protocol without IV contrast. COMPARISON:  CT of the pelvis dated October 22, 2018. PET-CT dated January 06, 2018. FINDINGS: CT HEAD FINDINGS Brain: No evidence of acute infarction, hemorrhage, hydrocephalus, extra-axial collection or mass lesion/mass effect. There is a small punctate density in the anterior left frontal lobe (axial series 4, image 25). This is favored to represent a small calcification as opposed to subarachnoid hemorrhage. There are bilateral hyperdensities in the CP angles favored to represent artifact. Vascular: No hyperdense vessel or unexpected calcification. Skull: There is extensive left-sided scalp swelling with pockets of subcutaneous gas and a large scalp laceration. There is no acute displaced calvarial fracture. Sinuses/Orbits: No acute finding. Other: None. CT CERVICAL FINDINGS Alignment:  Normal. Skull base and vertebrae: No acute fracture. No primary bone lesion or focal pathologic process. Soft tissues and spinal canal: No prevertebral fluid or swelling. No visible canal hematoma. Disc levels:  Mild disc height loss is noted in the cervical spine. Other: None. CT CHEST FINDINGS Cardiovascular: The heart size is unremarkable. There is a well-positioned right-sided Port-A-Cath in place. There is no evidence for thoracic aortic dissection or aneurysm. There is no large centrally located pulmonary embolism. No significant pericardial effusion. Mediastinum/Nodes: --No mediastinal or hilar lymphadenopathy. --No axillary lymphadenopathy. --No supraclavicular lymphadenopathy. --Normal thyroid gland. --The esophagus is unremarkable Lungs/Pleura: No pulmonary nodules or masses. No pleural effusion or pneumothorax. No focal airspace consolidation. No focal pleural abnormality. Musculoskeletal: There is no evidence for rib fracture. There is an acute fracture of the T9 vertebral body. This fracture  extends superiorly through both endplates and posteriorly into the posterior elements including the bilateral lamina. There is no significant retropulsion at this level. CT ABDOMEN AND PELVIS FINDINGS Hepatobiliary: The liver is normal. Normal gallbladder.There is no biliary ductal dilation. Pancreas: Normal contours without ductal dilatation. No peripancreatic fluid collection. Spleen: Unremarkable. Adrenals/Urinary Tract: --Adrenal glands: Unremarkable. --Right kidney/ureter: No hydronephrosis or radiopaque kidney stones. --Left kidney/ureter: No hydronephrosis or radiopaque kidney stones. --Urinary bladder: Unremarkable. Stomach/Bowel: --Stomach/Duodenum: No hiatal hernia or other gastric abnormality. Normal duodenal course and caliber. --Small bowel: Unremarkable. --Colon: There is scattered colonic diverticula without CT evidence for diverticulitis. --Appendix: Normal. Vascular/Lymphatic: Atherosclerotic calcification is present within the non-aneurysmal abdominal aorta, without hemodynamically significant stenosis. --No retroperitoneal lymphadenopathy. --No mesenteric lymphadenopathy. --No pelvic or inguinal lymphadenopathy. Reproductive: Unremarkable Other: No ascites or free air. There is a small fat containing umbilical hernia. Musculoskeletal. There is severe disc height loss at the L5-S1 level. There is no acute lumbar spine fracture. IMPRESSION: 1. Extensive left-sided scalp swelling with pockets of subcutaneous gas and a large scalp laceration. No acute displaced calvarial fracture. 2. No acute intracranial abnormality. 3. No acute fracture or malalignment of the cervical spine. 4. Acute T9 fracture involving the posterior elements consistent with a posterior tension band disruption. 5. No acute solid organ injury within the chest, abdomen, or pelvis. Aortic Atherosclerosis (ICD10-I70.0). Electronically Signed   By: Constance Holster M.D.   On: 07/02/2019 15:46   MR THORACIC SPINE WO  CONTRAST  Result Date: 07/02/2019 CLINICAL DATA:  Spine fracture.  Preop. EXAM: MRI THORACIC SPINE WITHOUT CONTRAST TECHNIQUE: Multiplanar, multisequence MR imaging of the thoracic spine was performed. No intravenous contrast was administered. COMPARISON:  None. FINDINGS: Alignment: Mild degenerative anterolisthesis at T1-2. No traumatic malalignment. Exaggerated thoracic kyphosis. Vertebrae: Marrow edema within the T9 body with height loss measuring 60% when compared to T8. Minimal posterior cortex buckling. There is anterior displacement of vertebral body. T4 superior endplate with mild depression and marrow edema. No evidence of bone lesion. Cord:  No evidence of injury.  No intrathecal collection. Paraspinal and other soft tissues: Paravertebral edema at the level of T9 fracture. There is strain of intrinsic back muscles at the level of T9, extending inferiorly and leftward. Subcutaneous contusion and mild muscular strain also seen at the upper thoracic levels. Disc levels: No significant disc degeneration or degenerative impingement. IMPRESSION: 1. T9 body fracture with 60% height loss. 2. T4 superior endplate fracture with minimal depression. 3. Back muscle strain without major ligamentous disruption. Electronically Signed   By: Monte Fantasia M.D.   On: 07/02/2019 20:50   CT ABDOMEN PELVIS W CONTRAST  Result Date: 07/02/2019 CLINICAL DATA:  Pain status post fall from ladder. Laceration and bleeding to the back of the head. EXAM: CT HEAD WITHOUT CONTRAST CT CERVICAL SPINE WITHOUT CONTRAST CT CHEST, ABDOMEN AND PELVIS WITHOUT CONTRAST TECHNIQUE: Contiguous axial images were obtained from the base of the skull through the vertex without intravenous contrast. Multidetector CT imaging of the cervical spine was performed without intravenous contrast. Multiplanar CT image reconstructions were also generated. Multidetector CT imaging of the chest, abdomen and pelvis was performed following the standard protocol  without IV contrast. COMPARISON:  CT of the pelvis dated October 22, 2018. PET-CT dated January 06, 2018. FINDINGS: CT HEAD FINDINGS Brain: No evidence of acute infarction, hemorrhage, hydrocephalus, extra-axial collection or mass lesion/mass effect. There is a small punctate density in the anterior left frontal lobe (axial series 4, image 25). This is favored to represent a small calcification as opposed to subarachnoid hemorrhage. There are bilateral hyperdensities in the CP angles favored to represent artifact. Vascular: No hyperdense vessel or unexpected calcification. Skull: There is extensive left-sided scalp swelling with pockets of subcutaneous gas and a large scalp laceration. There is no acute displaced calvarial fracture. Sinuses/Orbits: No acute finding. Other: None. CT CERVICAL FINDINGS Alignment: Normal. Skull base and vertebrae: No acute fracture. No primary bone lesion or focal pathologic process. Soft tissues and spinal canal: No prevertebral fluid or swelling. No visible canal hematoma. Disc levels:  Mild disc height loss is noted in the cervical spine. Other: None. CT CHEST FINDINGS Cardiovascular: The heart size is unremarkable. There is a well-positioned right-sided Port-A-Cath in place. There is no evidence for thoracic aortic dissection or aneurysm. There is no large centrally located pulmonary embolism. No significant pericardial effusion. Mediastinum/Nodes: --No mediastinal or hilar lymphadenopathy. --No axillary lymphadenopathy. --No supraclavicular lymphadenopathy. --Normal thyroid gland. --The esophagus is unremarkable Lungs/Pleura: No pulmonary nodules or masses. No pleural effusion or pneumothorax. No focal airspace consolidation. No focal pleural abnormality. Musculoskeletal: There is no evidence for rib fracture. There is an acute fracture of the T9 vertebral body. This fracture extends superiorly through both endplates and posteriorly into the posterior elements including the  bilateral lamina. There is no significant retropulsion at this level. CT ABDOMEN AND PELVIS FINDINGS Hepatobiliary: The liver is normal. Normal gallbladder.There is no biliary ductal dilation. Pancreas: Normal contours without ductal dilatation. No peripancreatic fluid collection. Spleen: Unremarkable. Adrenals/Urinary Tract: --Adrenal glands: Unremarkable. --Right kidney/ureter: No hydronephrosis or radiopaque kidney stones. --Left kidney/ureter: No hydronephrosis or radiopaque kidney stones. --Urinary bladder: Unremarkable. Stomach/Bowel: --Stomach/Duodenum: No hiatal hernia or other gastric abnormality. Normal duodenal course and caliber. --Small bowel: Unremarkable. --Colon: There is scattered colonic diverticula without CT evidence for diverticulitis. --Appendix: Normal. Vascular/Lymphatic: Atherosclerotic calcification is present within the non-aneurysmal abdominal aorta, without hemodynamically significant stenosis. --No retroperitoneal lymphadenopathy. --No mesenteric lymphadenopathy. --No pelvic or inguinal lymphadenopathy. Reproductive: Unremarkable Other: No ascites or free air. There is a small fat containing umbilical hernia. Musculoskeletal. There is severe disc height loss at the L5-S1 level. There is no acute lumbar spine fracture. IMPRESSION: 1. Extensive left-sided scalp swelling with pockets of subcutaneous gas and a large scalp laceration. No acute displaced calvarial fracture. 2. No acute intracranial abnormality. 3. No acute fracture or malalignment of the cervical spine. 4. Acute T9 fracture involving the posterior elements consistent with a posterior tension band disruption. 5. No acute solid organ injury within the chest, abdomen, or pelvis. Aortic Atherosclerosis (ICD10-I70.0). Electronically Signed   By: Constance Holster M.D.   On: 07/02/2019 15:46   ASSESSMENT: 69 y.o. 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 rigors 02/27/2014: received IV vancomycin and levaquin briefly, then levaquin po (pcn allergy)   (6) bendamustine/ rituximab for 6 cycles, completed 05/04/2014, with improvement but not normalization of the absolute lymphocyte count   (7) ibrutinib started 05/08/2014, currently at 420mg  daily. Stopped by patient in June, 2019 restarted in September, 2019, discontinued October 2020             (a) hepatitis B studies 03/06/2014 and 12/31/2016, negative             (b) ibrutinib restarted 01/06/2019 (at 280 mg daily) in conjunction with venetoclax   (8) CT abd/pelvis 10/22/2018 shows retroperitoneal adenopathy, increased splenomegaly   (9) started venetoclax 12/15/2018             (a) uneventful ramp-up to the 100 mg a day dose, then held at that dose due to symptoms  PLAN: Rachel [name] is now admitted following a fall.  CBC from today has been reviewed which shows mild anemia which is likely due to her blood loss following the fall and mild thrombocytopenia which seems to be persistent.  From our standpoint, there would be no issue in treating her spinal fracture and may proceed with this despite her CLL diagnosis.  For her CLL, she remains on venetoclax and ibrutinib.   Mikey Bussing, NP 07/04/2019 1:36 PM  ADDENDUM: Rachel [name] has a long history of chronic lymphoid leukemia, currently well-controlled on venetoclax.  While this makes her immunocompromised it does not affect clotting and she could undergo invasive procedures (kyphoplasty perhaps) at your discretion. I am not sure what the precipitating event was but she tells me she had gone up and down the same stairs more than a dozen times that day (putting things away in the attic). She had an appointmetn! with me for 5/24 but we will move that back as we have current counts. She is looking forward to dischare and tells me her sister will be coming to help her husband care for her. Greatly appreciate your help to this patient and he family

## 2019-07-04 NOTE — TOC Initial Note (Addendum)
Transition of Care [facility]) - Initial/Assessment Note    Patient Details  Name: Rachel [name] MRN: [id number] Date of Birth: 1950/04/09  [facility]) CM/SW Contact:    Ella Bodo, RN Phone Number: 07/04/2019, 5:20 PM  Clinical Narrative:  Pt is a 69 y/o female with PMHx including Chronic lymphoblastic leukemia, Diverticulitis, and Lower back pain who suffered a fall down attic stairs, suffering a large scalp laceration (repaired in ED), T4 andT9 fracture.  PTA, pt independent, lives at home with spouse.  Husband able to provide care at discharge.  PT/OT recommending HH follow up, and pt agreeable to services.  Referral to [facility], per pt choice.  Referral to [place] for DME needs.  RW to be delivered to pt's room prior to dc; hospital bed and 3 in 1 to be delivered to home.                  Expected Discharge Plan: [place] Barriers to Discharge: Continued Medical Work up   Patient Goals and CMS Choice Patient states their goals for this hospitalization and ongoing recovery are:: to feel better CMS Medicare.gov Compare Post Acute Care list provided to:: Patient Choice offered to / list presented to : Patient  Expected Discharge Plan and Services Expected Discharge Plan: [place]   Discharge Planning Services: CM Consult Post Acute Care Choice: [place] arrangements for the past 2 months: Single Family Home Expected Discharge Date: 07/05/19               DME Arranged: Berta Minor rolling, Hospital bed   Date DME Agency Contacted: 07/04/19 Time DME Agency Contacted: 1500 Representative spoke with at DME Agency: [place]: PT, OT [place] Agency: [facility] (now [facility]) Date [place]: 07/04/19 Time [place]: 26 Representative spoke with at [place]: Joen Laura  Prior Living Arrangements/Services Living arrangements for the past 2 months:  [place] with:: Spouse Patient language and need for interpreter reviewed:: Yes Do you feel safe going back to the place where you live?: Yes      Need for Family Participation in Patient Care: Yes (Comment) Care giver support system in place?: Yes (comment)   Criminal Activity/Legal Involvement Pertinent to Current Situation/Hospitalization: No - Comment as needed  Activities of Daily Living Home Assistive Devices/Equipment: None ADL Screening (condition at time of admission) Patient's cognitive ability adequate to safely complete daily activities?: Yes Is the patient deaf or have difficulty hearing?: No Does the patient have difficulty seeing, even when wearing glasses/contacts?: No Does the patient have difficulty concentrating, remembering, or making decisions?: No Patient able to express need for assistance with ADLs?: Yes Does the patient have difficulty dressing or bathing?: No Independently performs ADLs?: Yes (appropriate for developmental age) Does the patient have difficulty walking or climbing stairs?: No Weakness of Legs: None Weakness of Arms/Hands: None  Permission Sought/Granted   Permission granted to share information with : Yes, Verbal Permission Granted     Permission granted to share info w AGENCY: [facility]        Emotional Assessment Appearance:: Appears stated age Attitude/Demeanor/Rapport: Gracious, Engaged Affect (typically observed): Accepting Orientation: : Oriented to Self, Oriented to Place, Oriented to  Time, Oriented to Situation      Admission diagnosis:  T9 vertebral fracture ([place]) [S22.079A] Fall, initial encounter [W19.XXXA] Laceration of scalp, initial encounter [S01.01XA] Closed fracture  of ninth thoracic vertebra, unspecified fracture morphology, initial encounter [facility]) [id number] Patient Active Problem List   Diagnosis Date Noted  . T9 vertebral fracture ([place]) 07/02/2019  . Aortic atherosclerosis ([place])  01/07/2018  . Chronic rhinitis 11/03/2017  . Mild intermittent asthma 11/03/2017  . History of food allergy 11/03/2017  . Lymphoma, small lymphocytic ([place]) 10/05/2017  . Bruising 05/12/2016  . Thrombocytopenia ([place]) 05/12/2016  . Port catheter in place 10/19/2015  . Dehydration 06/26/2015  . Transaminitis 06/26/2015  . Nausea with vomiting 06/26/2015  . Diverticulitis 06/25/2015  . Vaginal discharge 01/31/2014  . Low grade squamous intraepithelial lesion (LGSIL) on Papanicolaou smear of cervix 01/31/2014  . Headache disorder 01/05/2014  . CLL (chronic lymphocytic leukemia) ([place]) 07/17/2011   PCP:  Leeroy Cha, MD Pharmacy:   [facility] [place], [place] [address] [place] [address] Phone: [phone] Fax: [phone]  [place], [place] - [address] [place] [address] Phone: [phone] Fax: [phone]  [facility] [place], [place] [address] [place] [address] Phone: [phone] Fax: [phone]     Social Determinants of Health (SDOH) Interventions    Readmission Risk Interventions No flowsheet data found.  Reinaldo Raddle, RN, BSN  Trauma/Neuro ICU Case Manager [phone]

## 2019-07-04 NOTE — Plan of Care (Signed)
  Problem: Education: Goal: Knowledge of General Education information will improve Description Including pain rating scale, medication(s)/side effects and non-pharmacologic comfort measures Outcome: Progressing   

## 2019-07-04 NOTE — Progress Notes (Signed)
Occupational Therapy Treatment Patient Details Name: Rachel [name] MRN: [id number] DOB: 11/17/1950 Today's Date: 07/04/2019    History of present illness Pt is a 69 y/o female with PMHx including Chronic lymphoblastic leukemia, Diverticulitis, and Lower back pain who suffered a fall down attic stairs, suffering a large scalp laceration (repaired in ED), T4 andT9 fracture. Neurosx currently assessing need for surgery.    OT comments  Patient supine in bed with brace on upon entry, agreeable to OT session. Reviewed back precautions, brace mgmt (with pt and spouse), and ADl compensatory techniques.  Educated on use of toilet aide for toileting, but pt declines LB AE as plans to have spouse/sister to assist her initially. Simulated shower transfers using RW (reverse step) with min guard, good understanding of techniques.  Patient will benefit from further OT services, continue to recommend [facility].    Follow Up Recommendations  Home health OT;Supervision/Assistance - 24 hour    Equipment Recommendations  3 in 1 bedside commode    Recommendations for Other Services      Precautions / Restrictions Precautions Precautions: Fall;Back Precaution Booklet Issued: Yes (comment) Precaution Comments: reviewed back precautions with pt  Required Braces or Orthoses: Spinal Brace Spinal Brace: Thoracolumbosacral orthotic(for OOB, per orders okay to remove to shower ) Restrictions Weight Bearing Restrictions: No       Mobility Bed Mobility Overal bed mobility: Needs Assistance Bed Mobility: Sit to Sidelying;Rolling Rolling: Min guard Sidelying to sit: Min guard       General bed mobility comments: min guard for safety/technique   Transfers Overall transfer level: Needs assistance Equipment used: Rolling walker (2 wheeled) Transfers: Sit to/from Stand Sit to Stand: Min guard         General transfer comment: for safety and balance     Balance Overall balance assessment: Needs  assistance Sitting-balance support: Feet supported Sitting balance-Leahy Scale: Fair Sitting balance - Comments: supervision for safety    Standing balance support: Bilateral upper extremity supported;During functional activity Standing balance-Leahy Scale: Fair Standing balance comment: min guard for safety                            ADL either performed or assessed with clinical judgement   ADL Overall ADL's : Needs assistance/impaired                     Lower Body Dressing: Maximal assistance;Sit to/from stand Lower Body Dressing Details (indicate cue type and reason): pt unable to complete figure 4 technique to manage sock, reviewed compensatory techniques and reports plan to have her sister/spouse assist her  Toilet Transfer: Min guard;Ambulation;RW Toilet Transfer Details (indicate cue type and reason): simulated in room    Toileting - Clothing Manipulation Details (indicate cue type and reason): educated on use of toilet aide  Tub/ Shower Transfer: Min guard;Walk-in shower;Ambulation;Rolling walker Tub/Shower Transfer Details (indicate cue type and reason): reviewed technique for reverse step into shower using RW, pt demonstrated technique with min guard  Functional mobility during ADLs: Min guard;Rolling walker General ADL Comments: reviewed back precautions, ADL compensatory technique and brace mgmt; verbally described brace mgmt with pt/spouse and educated on when adjusting at EOB. Educated to remove supine to give her back a break and to avoid pressure areas.       Vision       Perception     Praxis      Cognition Arousal/Alertness: Awake/alert Behavior During Therapy: [facility] for tasks  assessed/performed Overall Cognitive Status: Within Functional Limits for tasks assessed                                          Exercises     Shoulder Instructions       General Comments      Pertinent Vitals/ Pain       Pain Assessment:  Faces Faces Pain Scale: Hurts little more Pain Location: back Pain Descriptors / Indicators: Sore;Heaviness;Discomfort Pain Intervention(s): Limited activity within patient's tolerance;Monitored during session;Patient requesting pain meds-RN notified  Home Living                                          Prior Functioning/Environment              Frequency  Min 2X/week        Progress Toward Goals  OT Goals(current goals can now be found in the care plan section)  Progress towards OT goals: Progressing toward goals  Acute Rehab OT Goals Patient Stated Goal: regain her independence  OT Goal Formulation: With patient  Plan Discharge plan remains appropriate;Frequency remains appropriate    Co-evaluation                 AM-PAC OT "6 Clicks" Daily Activity     Outcome Measure   Help from another person eating meals?: None Help from another person taking care of personal grooming?: A Little Help from another person toileting, which includes using toliet, bedpan, or urinal?: A Little Help from another person bathing (including washing, rinsing, drying)?: A Lot Help from another person to put on and taking off regular upper body clothing?: A Little Help from another person to put on and taking off regular lower body clothing?: A Lot 6 Click Score: 17    End of Session Equipment Utilized During Treatment: Rolling walker;Back brace  OT Visit Diagnosis: Other abnormalities of gait and mobility (R26.89);Pain;History of falling (Z91.81) Pain - part of body: (back)   Activity Tolerance Patient tolerated treatment well   Patient Left in chair;with call bell/phone within reach   Nurse Communication Mobility status        Time: [id number] OT Time Calculation (min): 30 min  Charges: OT General Charges $OT Visit: 1 Visit OT Treatments $Self Care/Home Management : 23-37 mins  [place] Pager  [phone] Office [phone]     Delight Stare 07/04/2019, 1:23 PM

## 2019-07-05 MED ORDER — DOCUSATE SODIUM 100 MG PO CAPS: 100.0000 mg | ORAL_CAPSULE | Freq: Every day | ORAL | Status: DC | PRN

## 2019-07-05 MED ORDER — METHOCARBAMOL 500 MG PO TABS: 1000.0000 mg | ORAL_TABLET | Freq: Three times a day (TID) | ORAL | 0 refills | Status: DC | PRN

## 2019-07-05 MED ORDER — POLYETHYLENE GLYCOL 3350 17 G PO PACK: 17.0000 g | PACK | Freq: Every day | ORAL | Status: DC | PRN

## 2019-07-05 NOTE — Progress Notes (Signed)
Neurosurgery Patient reports pain continues to improve. She is mobilizing out of bed into a chair, wearing her brace. She would like to continue with non-operative therapy. I discussed with her that should her pain worsen or persist, we certainly would have the option of retrieval augmentation at that time. I would like her to follow up in two weeks with a standing x-ray. She will need to continue to wear the TLSO brace went out of b ed.

## 2019-07-05 NOTE — Discharge Summary (Signed)
Physician Discharge Summary  Patient ID: Rachel [name] MRN: [id number] DOB/AGE: 24-Aug-1950 69 y.o.  Admit date: 07/02/2019 Discharge date: 07/05/2019  Discharge Diagnoses Fall down 3 stairs Scalp laceration T4 compression fracture T9 compression fracture ABL anemia, stable CLL  Consultants Neurosurgery  Procedures Laceration repair - 07/02/19 Dr. Noemi Chapel  HPI: Patient is a 69 year old female was walking down the pulldown stairs from the attic. She got to the final 3 or 4 steps and fell down. She struck her head and noted bleeding from a laceration. She was able to crawl to the phone and call EMS. She was brought in as a nontrauma code activation and underwent evaluation in the emergency department. She was found to have a large scalp laceration which was repaired by Dr. Sabra Heck. She was also found to have a T4 and T9 fracture and Dr. Marcello Moores with neurosurgery was consulted for that. She complained of back pain. She reported that she is currently undergoing treatment for CLL by Dr. Jana Hakim at the cancer center.  Hospital Course: Patient was admitted to the trauma service. Neurosurgery recommended TLSO for thoracic fractures. Patient's hemoglobin stabilized 5/10. Patient was evaluated by therapies who recommended home health PT/OT. Patient also developed some mild constipation which resolved with miralax. Patient had a bad reaction to oxycodone and did not feel like tramadol made a big difference, pain was controlled with tylenol and robaxin. On 07/05/19 patient was tolerating a diet, voiding appropriately, pain well controlled, VSS and overall felt stable for discharge home with her husband. Follow up as outlined below.    PE: General: pleasant, WD, WN female who is sitting chair in NAD HEENT: laceration on the L with staples present and c/d/i, Sclera are noninjected.  PERRL.  Ears and nose without any masses or lesions.  Mouth is pink and moist Heart: regular, rate, and rhythm.   Normal s1,s2. No obvious murmurs, gallops, or rubs noted.  Palpable radial and pedal pulses bilaterally Lungs: CTAB, no wheezes, rhonchi, or rales noted.  Respiratory effort nonlabored Abd: soft, NT, ND, +BS, no masses, hernias, or organomegaly MS: all 4 extremities are symmetrical with no cyanosis, clubbing, or edema. TLSO brace present  Skin: warm and dry with no masses, lesions, or rashes Neuro: Cranial nerves 2-12 grossly intact, sensation grossly intact throughout Psych: A&Ox3 with an appropriate affect.   Allergies as of 07/05/2019      Reactions   Penicillins Anaphylaxis   "stopped breathing" "drops my heartbeat" "I just pass out"      Medication List    TAKE these medications   acetaminophen 500 MG tablet Commonly known as: TYLENOL Take 500-1,000 mg by mouth every 6 (six) hours as needed for headache.   acyclovir 400 MG tablet Commonly known as: ZOVIRAX Take 1 tablet (400 mg total) by mouth 2 (two) times daily.   albuterol 108 (90 Base) MCG/ACT inhaler Commonly known as: VENTOLIN HFA Inhale 1-2 puffs into the lungs every 6 (six) hours as needed for shortness of breath or wheezing.   allopurinol 300 MG tablet Commonly known as: ZYLOPRIM TAKE 1 TABLET DAILY   azelastine 0.1 % nasal spray Commonly known as: ASTELIN Place into both nostrils 2 (two) times daily. Use in each nostril as directed   Calcium Citrate-Vitamin D 200-250 MG-UNIT Tabs Take 1 tablet by mouth 4 (four) times daily.   docusate sodium 100 MG capsule Commonly known as: COLACE Take 1 capsule (100 mg total) by mouth daily as needed for mild constipation.   Elestrin 0.52  MG/0.87 GM (0.06%) Gel Generic drug: Estradiol APPLY 2 PUMPS THINLY TO UPPER ARM DAILY AT BEDTIME What changed:   how much to take  how to take this  when to take this   famotidine 20 MG tablet Commonly known as: PEPCID Take 20 mg by mouth at bedtime.   Ibrutinib 280 MG Tabs Take 280 mg by mouth daily. Take with water at  approximately the same time each day   lidocaine-prilocaine cream Commonly known as: EMLA Apply a nickel size amount to skin on top of port- cover with saran wrap at least 1 hour before access   Magnesium 200 MG Tabs Take 2 tablets by mouth daily.   methocarbamol 500 MG tablet Commonly known as: ROBAXIN Take 2 tablets (1,000 mg total) by mouth every 8 (eight) hours as needed for muscle spasms.   montelukast 10 MG tablet Commonly known as: SINGULAIR Take 10 mg by mouth at bedtime.   polyethylene glycol 17 g packet Commonly known as: MIRALAX / GLYCOLAX Take 17 g by mouth daily as needed for mild constipation.   progesterone 200 MG capsule Commonly known as: PROMETRIUM Take 1 capsule (200 mg total) by mouth at bedtime.   traZODone 100 MG tablet Commonly known as: DESYREL Take 1 tablet (100 mg total) by mouth at bedtime.   venetoclax 100 MG Tabs Take 100 mg by mouth daily. Take with food and water at approximately the same time each day.   Vitamin D-3 125 MCG (5000 UT) Tabs Take 2 tablets by mouth daily.   Zinc 30 MG Tabs Take 1 tablet by mouth daily.            Durable Medical Equipment  (From admission, onward)         Start     Ordered   07/04/19 0851  For home use only DME Hospital bed  Once    Question Answer Comment  Length of Need 6 Months   Patient has (list medical condition): back fractures   The above medical condition requires: Patient requires the ability to reposition frequently   Bed type Semi-electric   [place] Yes      07/04/19 0851   07/04/19 0824  For home use only DME 3 n 1  Once     07/04/19 0824   07/04/19 0824  For home use only DME Walker rolling  Once    Question Answer Comment  Walker: With 5 Inch Wheels   Patient needs a walker to treat with the following condition Compression fracture of T4 vertebra ([place])   Patient needs a walker to treat with the following condition Compression fracture of T9 vertebra ([place])      07/04/19  0824           Follow-up Information    Home, [facility] up.   Specialty: Home Health Services Why: Home health physical and occupational therapists to follow up with you at home.  They will call you for an appointment. Contact information: [address] [place] [address] [phone]        Vallarie Mare, MD. Call.   Specialty: Neurosurgery Why: Call and schedule a follow up appointment regarding back fractures in 1-2 weeks Contact information: [address] [place] [address] [phone]        Surgery, [place]. Go on 07/11/2019.   Specialty: General Surgery Why: Appointment for staple removal scheduled for X. Please arrive 30 min prior to appointment time.  Bring photo ID and insurance information.  Contact information: [address] [place] [address] [phone]           Signed: Norm Parcel , [facility] Surgery 07/05/2019, 7:50 AM Please see Amion for pager number during day hours 7:00am-4:30pm

## 2019-07-05 NOTE — Progress Notes (Signed)
Rachel [name] to be D/C'd  per MD order. Discussed with the patient and all questions fully answered.  VSS, Skin clean, dry and intact without evidence of skin break down, no evidence of skin tears noted.  IV catheter discontinued intact. Site without signs and symptoms of complications. Dressing and pressure applied.  An After Visit Summary was printed and given to the patient. Patient received prescription.  D/c education completed with patient/family including follow up instructions, medication list, d/c activities limitations if indicated, with other d/c instructions as indicated by MD - patient able to verbalize understanding, all questions fully answered.   Patient instructed to return to ED, call 911, or call MD for any changes in condition.   Patient to be escorted via [place], and D/C home via private auto.

## 2019-07-05 NOTE — TOC Transition Note (Signed)
Transition of Care [facility]) - CM/SW Discharge Note   Patient Details  Name: Rachel [name] Bleicher MRN: [id number] Date of Birth: 09-02-1950  Transition of Care [facility]) CM/SW Contact:  Ella Bodo, RN Phone Number: 07/05/2019, 12:09 PM   Clinical Narrative:  Pt medically stable for discharge home today with spouse and [facility] services as arranged.  RW has been delivered to room; [employer] to contact husband this morning for delivery of hosp bed and 3 in 1 BSC.  Pt inquired about overbed table, but then declined, as it is a self-pay item, and insurance will not cover.       Final next level of care: [place] Barriers to Discharge: Barriers Resolved   Patient Goals and CMS Choice Patient states their goals for this hospitalization and ongoing recovery are:: to feel better CMS Medicare.gov Compare Post Acute Care list provided to:: Patient Choice offered to / list presented to : Patient                         Discharge Plan and Services   Discharge Planning Services: CM Consult Post Acute Care Choice: Home Health          DME Arranged: 3-N-1, Walker rolling, Hospital bed   Date DME Agency Contacted: 07/04/19 Time DME Agency Contacted: 1500 Representative spoke with at DME Agency: [place]: PT, OT [place] Agency: [facility] (now [facility]) Date [place]: 07/04/19 Time [place]: 37 Representative spoke with at [place]: [place] ([place]) Interventions     Readmission Risk Interventions Readmission Risk Prevention Plan 07/05/2019  Transportation Screening Complete  PCP or Specialist Appt within 5-7 Days Complete  Home Care Screening Complete  Medication Review (RN CM) Complete  Some recent data might be hidden   Reinaldo Raddle, RN, BSN  Trauma/Neuro ICU Case Manager [phone]

## 2019-07-05 NOTE — Progress Notes (Signed)
Physical Therapy Treatment Patient Details Name: Sarika Hargett Giambalvo MRN: [id number] DOB: [birth year] Today's Date: 07/05/2019    History of Present Illness Pt is a 69 y/o female admitted 07/02/19 after fall down attic stairs, suffering a large scalp laceration (repaired in ED), T4 and T9 compression fracture. Per neurosurgery, plan for non-operative management. PMH includes chronic lymphoblastic leukemia, diverticulitis, LBP.   PT Comments    Pt progressing well with mobility. Today's session focused on stair training, pt moving well at supervision-level. Husband present and supportive, receptive to education. Educ pt and husband re: precautions, brace wear, positioning, safety, importance of mobility. Pt preparing for d/c home today.    Follow Up Recommendations  Home health PT;Supervision for mobility/OOB     Equipment Recommendations  Rolling walker with 5" wheels;3in1 (PT)(already delivered)    Recommendations for Other Services       Precautions / Restrictions Precautions Precautions: Fall;Back Precaution Comments: Pt able to recall 2/3 back precautions, able to get 3rd with cues Required Braces or Orthoses: Spinal Brace Spinal Brace: Thoracolumbosacral orthotic Restrictions Weight Bearing Restrictions: No    Mobility  Bed Mobility Overal bed mobility: Independent Bed Mobility: Supine to Sit;Sit to Supine           General bed mobility comments: Educ on brace application while supine, pt adamant about putting on in sitting, able to perform log roll indep supine<>sit while keeping back straight, then donning/doffing brace at EOB; reports this is how she plans to do at home  Transfers Overall transfer level: Modified independent Equipment used: Rolling walker (2 wheeled);1 person hand held assist Transfers: Sit to/from Stand Sit to Stand: Supervision;Modified independent (Device/Increase time)         General transfer comment: Mod indep sit<>stand with RW, good  technique; supervision standing without DME, HHA for balance  Ambulation/Gait Ambulation/Gait assistance: Supervision Gait Distance (Feet): 240 Feet Assistive device: Rolling walker (2 wheeled) Gait Pattern/deviations: Step-through pattern;Decreased stride length;Trunk flexed Gait velocity: Decreased   General Gait Details: Slow, steady gait with RW at supervision-level; short amb distance in room without DME, pt using HHA for balance   Stairs Stairs: Yes Stairs assistance: Supervision Stair Management: One rail Right;Forwards;Step to pattern Number of Stairs: 11 General stair comments: Able to ascend/descend steps with single UE rail support, supervision for safety; husband present for education   Wheelchair Mobility    Modified Rankin (Stroke Patients Only)       Balance Overall balance assessment: Needs assistance Sitting-balance support: Feet supported Sitting balance-Leahy Scale: Good     Standing balance support: Bilateral upper extremity supported;During functional activity;No upper extremity supported Standing balance-Leahy Scale: Fair Standing balance comment: Can static stand without UE support                            Cognition Arousal/Alertness: Awake/alert Behavior During Therapy: WFL for tasks assessed/performed Overall Cognitive Status: Within Functional Limits for tasks assessed                                        Exercises      General Comments General comments (skin integrity, edema, etc.): Indep to don/doff brace. Pt's RW delivered and adjusted to height. Husband present during session; educ on DME use and recommendations. Reviewed precautions, positioning, brace wear, importance of mobility      Pertinent Vitals/Pain Pain Assessment: Faces Faces Pain  Scale: Hurts a little bit Pain Location: back Pain Descriptors / Indicators: Guarding Pain Intervention(s): Monitored during session    Home Living                       Prior Function            PT Goals (current goals can now be found in the care plan section) Progress towards PT goals: Progressing toward goals    Frequency    Min 5X/week      PT Plan Current plan remains appropriate    Co-evaluation              AM-PAC PT "6 Clicks" Mobility   Outcome Measure  Help needed turning from your back to your side while in a flat bed without using bedrails?: None Help needed moving from lying on your back to sitting on the side of a flat bed without using bedrails?: None Help needed moving to and from a bed to a chair (including a wheelchair)?: None Help needed standing up from a chair using your arms (e.g., wheelchair or bedside chair)?: None Help needed to walk in hospital room?: None Help needed climbing 3-5 steps with a railing? : A Little 6 Click Score: 23    End of Session Equipment Utilized During Treatment: Gait belt;Back brace Activity Tolerance: Patient tolerated treatment well Patient left: in bed;with call bell/phone within reach;with family/visitor present Nurse Communication: Mobility status PT Visit Diagnosis: Difficulty in walking, not elsewhere classified (R26.2);Pain     Time: [id number] PT Time Calculation (min) (ACUTE ONLY): 21 min  Charges:  $Gait Training: 8-22 mins                    Mabeline Caras, PT, DPT Acute Rehabilitation Services  Pager [phone] Office [place] 07/05/2019, 9:53 AM

## 2019-07-06 DIAGNOSIS — I700 Atherosclerosis of aorta: Secondary | ICD-10-CM | POA: Diagnosis not present

## 2019-07-06 DIAGNOSIS — J4520 Mild intermittent asthma, uncomplicated: Secondary | ICD-10-CM | POA: Diagnosis not present

## 2019-07-06 DIAGNOSIS — D696 Thrombocytopenia, unspecified: Secondary | ICD-10-CM | POA: Diagnosis not present

## 2019-07-06 DIAGNOSIS — Z8572 Personal history of non-Hodgkin lymphomas: Secondary | ICD-10-CM | POA: Diagnosis not present

## 2019-07-06 DIAGNOSIS — S0101XD Laceration without foreign body of scalp, subsequent encounter: Secondary | ICD-10-CM | POA: Diagnosis not present

## 2019-07-06 DIAGNOSIS — S22049D Unspecified fracture of fourth thoracic vertebra, subsequent encounter for fracture with routine healing: Secondary | ICD-10-CM | POA: Diagnosis not present

## 2019-07-06 DIAGNOSIS — Z9181 History of falling: Secondary | ICD-10-CM | POA: Diagnosis not present

## 2019-07-06 DIAGNOSIS — C9110 Chronic lymphocytic leukemia of B-cell type not having achieved remission: Secondary | ICD-10-CM | POA: Diagnosis not present

## 2019-07-06 DIAGNOSIS — S22079D Unspecified fracture of T9-T10 vertebra, subsequent encounter for fracture with routine healing: Secondary | ICD-10-CM | POA: Diagnosis not present

## 2019-07-08 ENCOUNTER — Telehealth: Payer: Self-pay | Admitting: *Deleted

## 2019-07-08 NOTE — Telephone Encounter (Signed)
This RN received VM from the pt stating she has tried to call the surgeon's office and no one has responded " and I am having more pain now - need to know who I can see "  This RN noted pt was seen by Dr Georganna Skeans with [facility] for wound care.  This RN called to [facility] - and spoke with Abigail Butts - concern given with request to contact the patient.  Abigail Butts verbalized need and stated she would follow up.

## 2019-07-08 NOTE — Telephone Encounter (Signed)
This RN called pt per noted from [place] stating pt called them pm 5/13 with temp of 100.5 with recommendation for pt to proceed to the ER.  No ER data found per above date.  Per phone conversation Rachel [name] states she used an old thermometer with reading of 100.5- she then obtain a " newer one " and it read 99.5 - so I took 2 tylenol and a muscle relaxer and slept like a baby until just a little while ago "  This morning her temp is 97.5.  This RN informed pt - primary concern at present for fevers is possible wound infection . This RN inquired who is following her for wound care for head injury.  She stated she is unsure who the doctor is but that she has an appointment this Monday at [facility] for follow up and possible staple removal.  This RN informed Rachel [name] to proceed to call them with concerns due to pending weekend and possible need for further recommendations.  Rachel [name] verbalized understanding as well as Rachel [name] was present during call as well.

## 2019-07-12 DIAGNOSIS — S0101XD Laceration without foreign body of scalp, subsequent encounter: Secondary | ICD-10-CM | POA: Diagnosis not present

## 2019-07-12 DIAGNOSIS — I700 Atherosclerosis of aorta: Secondary | ICD-10-CM | POA: Diagnosis not present

## 2019-07-12 DIAGNOSIS — S22049D Unspecified fracture of fourth thoracic vertebra, subsequent encounter for fracture with routine healing: Secondary | ICD-10-CM | POA: Diagnosis not present

## 2019-07-12 DIAGNOSIS — J4520 Mild intermittent asthma, uncomplicated: Secondary | ICD-10-CM | POA: Diagnosis not present

## 2019-07-12 DIAGNOSIS — C9110 Chronic lymphocytic leukemia of B-cell type not having achieved remission: Secondary | ICD-10-CM | POA: Diagnosis not present

## 2019-07-12 DIAGNOSIS — S22070D Wedge compression fracture of T9-T10 vertebra, subsequent encounter for fracture with routine healing: Secondary | ICD-10-CM | POA: Diagnosis not present

## 2019-07-12 DIAGNOSIS — S22079D Unspecified fracture of T9-T10 vertebra, subsequent encounter for fracture with routine healing: Secondary | ICD-10-CM | POA: Diagnosis not present

## 2019-07-13 DIAGNOSIS — J4520 Mild intermittent asthma, uncomplicated: Secondary | ICD-10-CM | POA: Diagnosis not present

## 2019-07-13 DIAGNOSIS — S22079D Unspecified fracture of T9-T10 vertebra, subsequent encounter for fracture with routine healing: Secondary | ICD-10-CM | POA: Diagnosis not present

## 2019-07-13 DIAGNOSIS — S22049D Unspecified fracture of fourth thoracic vertebra, subsequent encounter for fracture with routine healing: Secondary | ICD-10-CM | POA: Diagnosis not present

## 2019-07-13 DIAGNOSIS — S0101XD Laceration without foreign body of scalp, subsequent encounter: Secondary | ICD-10-CM | POA: Diagnosis not present

## 2019-07-13 DIAGNOSIS — I700 Atherosclerosis of aorta: Secondary | ICD-10-CM | POA: Diagnosis not present

## 2019-07-13 DIAGNOSIS — C9110 Chronic lymphocytic leukemia of B-cell type not having achieved remission: Secondary | ICD-10-CM | POA: Diagnosis not present

## 2019-07-14 DIAGNOSIS — W19XXXD Unspecified fall, subsequent encounter: Secondary | ICD-10-CM | POA: Diagnosis not present

## 2019-07-14 DIAGNOSIS — R2681 Unsteadiness on feet: Secondary | ICD-10-CM | POA: Diagnosis not present

## 2019-07-14 DIAGNOSIS — S22070D Wedge compression fracture of T9-T10 vertebra, subsequent encounter for fracture with routine healing: Secondary | ICD-10-CM | POA: Diagnosis not present

## 2019-07-14 DIAGNOSIS — S0101XS Laceration without foreign body of scalp, sequela: Secondary | ICD-10-CM | POA: Diagnosis not present

## 2019-07-14 DIAGNOSIS — S22049D Unspecified fracture of fourth thoracic vertebra, subsequent encounter for fracture with routine healing: Secondary | ICD-10-CM | POA: Diagnosis not present

## 2019-07-14 DIAGNOSIS — J4520 Mild intermittent asthma, uncomplicated: Secondary | ICD-10-CM | POA: Diagnosis not present

## 2019-07-14 DIAGNOSIS — C9110 Chronic lymphocytic leukemia of B-cell type not having achieved remission: Secondary | ICD-10-CM | POA: Diagnosis not present

## 2019-07-14 DIAGNOSIS — R262 Difficulty in walking, not elsewhere classified: Secondary | ICD-10-CM | POA: Diagnosis not present

## 2019-07-14 DIAGNOSIS — S22079D Unspecified fracture of T9-T10 vertebra, subsequent encounter for fracture with routine healing: Secondary | ICD-10-CM | POA: Diagnosis not present

## 2019-07-14 DIAGNOSIS — S0101XD Laceration without foreign body of scalp, subsequent encounter: Secondary | ICD-10-CM | POA: Diagnosis not present

## 2019-07-14 DIAGNOSIS — I700 Atherosclerosis of aorta: Secondary | ICD-10-CM | POA: Diagnosis not present

## 2019-07-15 ENCOUNTER — Other Ambulatory Visit: Payer: Self-pay | Admitting: *Deleted

## 2019-07-15 DIAGNOSIS — C8300 Small cell B-cell lymphoma, unspecified site: Secondary | ICD-10-CM

## 2019-07-17 NOTE — Progress Notes (Signed)
[facility]  [phone] [phone]     ID: Rachel [name]   DOB: July 25, 1950  MR#: [id number]  [id number]  Patient Care Team: Leeroy Cha, MD as PCP - General (Internal Medicine) Hagan Vanauken, Virgie Dad, MD as Consulting Physician (Oncology) [name], MD as Consulting Physician (Obstetrics and Gynecology) Vallarie Mare, MD as Consulting Physician (Neurosurgery) OTHER MD:  CHIEF COMPLAINT: Chronic lymphocytic leukemia  CURRENT TREATMENT: venetoclax, ibrutinib   INTERVAL HISTORY: Rachel [name] returns today for follow-up and treatment of her chronic lymphocytic leukemia/small-lymphocytic lymphoma accompanied by her husband Rachel [name].   She continues on venetoclax, 100 mg a day.  She has tolerated this remarkably well at the current dose.  She also continues on ibrutinib, which she says is the only thing that controls her warts.  She has had a little bit of bruising related to this but no other side effects.  Her warts are controlled.  Since her last visit, she was admitted to the hospital with back pain on 07/02/2019. She fell down her pulldown stairs from the attic, struck her head, and had a laceration.  Her large scalp laceration was repaired and she was also found to have a T9 fracture.  She has been seen by neurosurgery who was recommended mobilization with PT and TLSO brace when she is out of bed.   REVIEW OF SYSTEMS: Rachel [name] is wearing her brace and following instructions.  She tells me physical therapy is very painful.  She is on minimal pain medicine, only Tylenol at present.  She does use cyclobenzaprine and methocarbamol.  She still has some vertigo issues.  Her sister told her that allopurinol can cause people to lose a function of their legs and that can cause a fall.  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 She has been treated with Rituxan, ofatumumab, cladribine, bendamustine and currently with ibrutinib, with details summarized below.   PAST MEDICAL HISTORY: 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   Diverticulitis    Leukemia ([place])    Lower back pain     PAST SURGICAL HISTORY: Past Surgical History:  Procedure Laterality Date   BREAST SURGERY  2015   left breast bx   BUNIONECTOMY     right foot   lining of uterus removed     OTHER SURGICAL HISTORY     removal of uterine lining   TONSILLECTOMY      FAMILY HISTORY Family History  Problem Relation Age of Onset   Depression Son   The patient's mother will turn 80 on March 2021   GYNECOLOGIC HISTORY: Menarche age 19, first live birth age 67, she is [place] P3. She stopped having periods in 1999 after endometrial stripping. She continues on hormone replacement.   SOCIAL HISTORY: Her husband Rachel [name] is retired from [employer]. Her son Rachel [name] lives in [place]; he works for a [employer]. A second son lives in [place] and has two children. Son Rachel [name] lives in [place]. She has 8 grandchildren, the oldest is 27 and the youngest is 75.  ADVANCED DIRECTIVES: In place   HEALTH MAINTENANCE: Social History   Tobacco Use   Smoking status: Never Smoker   Smokeless tobacco: Never Used  Substance Use Topics  Alcohol use: Yes    Alcohol/week: 0.0 standard drinks    Comment: socially   Drug use: No     Colonoscopy: 12/2018 (Dr. Collene Mares), repeat in 10 years  PAP: December 2012/ Jodi Mourning  Bone density: Due  Lipid panel: per Dr Drema Dallas  Allergies  Allergen Reactions   Penicillins Anaphylaxis    "stopped breathing" "drops my heartbeat" "I just pass out"    Current Outpatient Medications  Medication Sig Dispense Refill    acetaminophen (TYLENOL) 500 MG tablet Take 500-1,000 mg by mouth every 6 (six) hours as needed for headache.      acyclovir (ZOVIRAX) 400 MG tablet Take 1 tablet (400 mg total) by mouth 2 (two) times daily. 180 tablet 3   albuterol (VENTOLIN HFA) 108 (90 Base) MCG/ACT inhaler Inhale 1-2 puffs into the lungs every 6 (six) hours as needed for shortness of breath or wheezing.     allopurinol (ZYLOPRIM) 300 MG tablet TAKE 1 TABLET DAILY (Patient taking differently: Take 300 mg by mouth daily. ) 90 tablet 3   azelastine (ASTELIN) 0.1 % nasal spray Place into both nostrils 2 (two) times daily. Use in each nostril as directed     Calcium Citrate-Vitamin D 200-250 MG-UNIT TABS Take 1 tablet by mouth 4 (four) times daily.     Cholecalciferol (VITAMIN D-3) 125 MCG (5000 UT) TABS Take 2 tablets by mouth daily.     docusate sodium (COLACE) 100 MG capsule Take 1 capsule (100 mg total) by mouth daily as needed for mild constipation.     Estradiol (ELESTRIN) 0.52 MG/0.87 GM (0.06%) GEL APPLY 2 PUMPS THINLY TO UPPER ARM DAILY AT BEDTIME (Patient taking differently: Apply 1 application topically at bedtime. APPLY 2 PUMPS THINLY TO UPPER ARM DAILY AT BEDTIME) 200 g 3   famotidine (PEPCID) 20 MG tablet Take 20 mg by mouth at bedtime.      Ibrutinib 280 MG TABS Take 280 mg by mouth daily. Take with water at approximately the same time each day 84 tablet 4   lidocaine-prilocaine (EMLA) cream Apply a nickel size amount to skin on top of port- cover with saran wrap at least 1 hour before access 30 g 0   Magnesium 200 MG TABS Take 2 tablets by mouth daily.     methocarbamol (ROBAXIN) 500 MG tablet Take 2 tablets (1,000 mg total) by mouth every 8 (eight) hours as needed for muscle spasms. 60 tablet 0   montelukast (SINGULAIR) 10 MG tablet Take 10 mg by mouth at bedtime.     polyethylene glycol (MIRALAX / GLYCOLAX) 17 g packet Take 17 g by mouth daily as needed for mild constipation.     progesterone  (PROMETRIUM) 200 MG capsule Take 1 capsule (200 mg total) by mouth at bedtime. 90 capsule 3   traZODone (DESYREL) 100 MG tablet Take 1 tablet (100 mg total) by mouth at bedtime. 90 tablet 3   venetoclax 100 MG TABS Take 100 mg by mouth daily. Take with food and water at approximately the same time each day. 30 tablet 6   Zinc 30 MG TABS Take 1 tablet by mouth daily.     No current facility-administered medications for this visit.    OBJECTIVE: white woman wearing a chest brace  Vitals:   07/18/19 0959  BP: (!) 95/48  Pulse: (!) 107  Resp: 18  Temp: 99.1 F (37.3 C)  SpO2: 99%   Wt Readings from Last 3 Encounters:  07/18/19 115 lb 1.6 oz (52.2 kg)  07/02/19 118 lb (53.5 kg)  04/25/19 116 lb 1.6 oz (52.7 kg)   Body mass index is 24.06 kg/m.    ECOG FS:0 - Asymptomatic  Sclerae unicteric, EOMs intact Wearing a mask No cervical or supraclavicular adenopathy, no axillary or inguinal adenopathy Lungs no rales or rhonchi Heart regular rate and rhythm Abd soft, nontender, positive bowel sounds MSK no focal spinal tenderness, no upper extremity lymphedema Neuro: nonfocal, well oriented, appropriate affect Breasts: Deferred   LAB RESULTS:  Lab Results  Component Value Date   WBC 5.6 07/04/2019   NEUTROABS 2.1 04/25/2019   HGB 11.0 (L) 07/04/2019   HCT 34.5 (L) 07/04/2019   MCV 89.8 07/04/2019   PLT 119 (L) 07/04/2019      Chemistry      Component Value Date/Time   NA 141 07/03/2019 0245   NA 140 12/31/2016 1406   K 3.8 07/03/2019 0245   K 3.6 12/31/2016 1406   CL 108 07/03/2019 0245   CL 101 12/15/2011 1322   CO2 22 07/03/2019 0245   CO2 24 12/31/2016 1406   BUN 9 07/03/2019 0245   BUN 12.8 12/31/2016 1406   CREATININE 0.80 07/03/2019 0245   CREATININE 0.82 12/15/2018 1425   CREATININE 0.9 12/31/2016 1406      Component Value Date/Time   CALCIUM 8.4 (L) 07/03/2019 0245   CALCIUM 8.8 12/31/2016 1406   ALKPHOS 106 07/02/2019 1349   ALKPHOS 71 12/31/2016  1406   AST 49 (H) 07/02/2019 1349   AST 18 12/15/2018 1425   AST 18 12/31/2016 1406   ALT 48 (H) 07/02/2019 1349   ALT 17 12/15/2018 1425   ALT 11 12/31/2016 1406   BILITOT 0.7 07/02/2019 1349   BILITOT 0.3 12/15/2018 1425   BILITOT 0.49 12/31/2016 1406      STUDIES: DG Thoracic Spine 2 View  Result Date: 07/03/2019 CLINICAL DATA:  Spine fracture. EXAM: THORACIC SPINE 2 VIEWS COMPARISON:  MRI and CT 1 day prior FINDINGS: There is a right-sided Port-A-Cath in place. There has been interval placement of a brace. Again noted is a T9 vertebral body fracture better visualized on the patient's prior cross-sectional imaging. The known T4 compression fracture is not well visualized on this study. No additional acute displaced fracture identified. IMPRESSION: Again noted is a T9 compression fracture as previously described. The known T4 compression fracture is better visualized on the patient's prior cross-sectional imaging. Electronically Signed   By: Constance Holster M.D.   On: 07/03/2019 17:06   CT HEAD WO CONTRAST  Result Date: 07/02/2019 CLINICAL DATA:  Pain status post fall from ladder. Laceration and bleeding to the back of the head. EXAM: CT HEAD WITHOUT CONTRAST CT CERVICAL SPINE WITHOUT CONTRAST CT CHEST, ABDOMEN AND PELVIS WITHOUT CONTRAST TECHNIQUE: Contiguous axial images were obtained from the base of the skull through the vertex without intravenous contrast. Multidetector CT imaging of the cervical spine was performed without intravenous contrast. Multiplanar CT image reconstructions were also generated. Multidetector CT imaging of the chest, abdomen and pelvis was performed following the standard protocol without IV contrast. COMPARISON:  CT of the pelvis dated October 22, 2018. PET-CT dated January 06, 2018. FINDINGS: CT HEAD FINDINGS Brain: No evidence of acute infarction, hemorrhage, hydrocephalus, extra-axial collection or mass lesion/mass effect. There is a small punctate density in  the anterior left frontal lobe (axial series 4, image 25). This is favored to represent a small calcification as opposed to subarachnoid hemorrhage. There are bilateral hyperdensities in the CP angles favored  to represent artifact. Vascular: No hyperdense vessel or unexpected calcification. Skull: There is extensive left-sided scalp swelling with pockets of subcutaneous gas and a large scalp laceration. There is no acute displaced calvarial fracture. Sinuses/Orbits: No acute finding. Other: None. CT CERVICAL FINDINGS Alignment: Normal. Skull base and vertebrae: No acute fracture. No primary bone lesion or focal pathologic process. Soft tissues and spinal canal: No prevertebral fluid or swelling. No visible canal hematoma. Disc levels:  Mild disc height loss is noted in the cervical spine. Other: None. CT CHEST FINDINGS Cardiovascular: The heart size is unremarkable. There is a well-positioned right-sided Port-A-Cath in place. There is no evidence for thoracic aortic dissection or aneurysm. There is no large centrally located pulmonary embolism. No significant pericardial effusion. Mediastinum/Nodes: --No mediastinal or hilar lymphadenopathy. --No axillary lymphadenopathy. --No supraclavicular lymphadenopathy. --Normal thyroid gland. --The esophagus is unremarkable Lungs/Pleura: No pulmonary nodules or masses. No pleural effusion or pneumothorax. No focal airspace consolidation. No focal pleural abnormality. Musculoskeletal: There is no evidence for rib fracture. There is an acute fracture of the T9 vertebral body. This fracture extends superiorly through both endplates and posteriorly into the posterior elements including the bilateral lamina. There is no significant retropulsion at this level. CT ABDOMEN AND PELVIS FINDINGS Hepatobiliary: The liver is normal. Normal gallbladder.There is no biliary ductal dilation. Pancreas: Normal contours without ductal dilatation. No peripancreatic fluid collection. Spleen:  Unremarkable. Adrenals/Urinary Tract: --Adrenal glands: Unremarkable. --Right kidney/ureter: No hydronephrosis or radiopaque kidney stones. --Left kidney/ureter: No hydronephrosis or radiopaque kidney stones. --Urinary bladder: Unremarkable. Stomach/Bowel: --Stomach/Duodenum: No hiatal hernia or other gastric abnormality. Normal duodenal course and caliber. --Small bowel: Unremarkable. --Colon: There is scattered colonic diverticula without CT evidence for diverticulitis. --Appendix: Normal. Vascular/Lymphatic: Atherosclerotic calcification is present within the non-aneurysmal abdominal aorta, without hemodynamically significant stenosis. --No retroperitoneal lymphadenopathy. --No mesenteric lymphadenopathy. --No pelvic or inguinal lymphadenopathy. Reproductive: Unremarkable Other: No ascites or free air. There is a small fat containing umbilical hernia. Musculoskeletal. There is severe disc height loss at the L5-S1 level. There is no acute lumbar spine fracture. IMPRESSION: 1. Extensive left-sided scalp swelling with pockets of subcutaneous gas and a large scalp laceration. No acute displaced calvarial fracture. 2. No acute intracranial abnormality. 3. No acute fracture or malalignment of the cervical spine. 4. Acute T9 fracture involving the posterior elements consistent with a posterior tension band disruption. 5. No acute solid organ injury within the chest, abdomen, or pelvis. Aortic Atherosclerosis (ICD10-I70.0). Electronically Signed   By: Constance Holster M.D.   On: 07/02/2019 15:46   CT CHEST W CONTRAST  Result Date: 07/02/2019 CLINICAL DATA:  Pain status post fall from ladder. Laceration and bleeding to the back of the head. EXAM: CT HEAD WITHOUT CONTRAST CT CERVICAL SPINE WITHOUT CONTRAST CT CHEST, ABDOMEN AND PELVIS WITHOUT CONTRAST TECHNIQUE: Contiguous axial images were obtained from the base of the skull through the vertex without intravenous contrast. Multidetector CT imaging of the cervical  spine was performed without intravenous contrast. Multiplanar CT image reconstructions were also generated. Multidetector CT imaging of the chest, abdomen and pelvis was performed following the standard protocol without IV contrast. COMPARISON:  CT of the pelvis dated October 22, 2018. PET-CT dated January 06, 2018. FINDINGS: CT HEAD FINDINGS Brain: No evidence of acute infarction, hemorrhage, hydrocephalus, extra-axial collection or mass lesion/mass effect. There is a small punctate density in the anterior left frontal lobe (axial series 4, image 25). This is favored to represent a small calcification as opposed to subarachnoid hemorrhage. There are bilateral hyperdensities  in the CP angles favored to represent artifact. Vascular: No hyperdense vessel or unexpected calcification. Skull: There is extensive left-sided scalp swelling with pockets of subcutaneous gas and a large scalp laceration. There is no acute displaced calvarial fracture. Sinuses/Orbits: No acute finding. Other: None. CT CERVICAL FINDINGS Alignment: Normal. Skull base and vertebrae: No acute fracture. No primary bone lesion or focal pathologic process. Soft tissues and spinal canal: No prevertebral fluid or swelling. No visible canal hematoma. Disc levels:  Mild disc height loss is noted in the cervical spine. Other: None. CT CHEST FINDINGS Cardiovascular: The heart size is unremarkable. There is a well-positioned right-sided Port-A-Cath in place. There is no evidence for thoracic aortic dissection or aneurysm. There is no large centrally located pulmonary embolism. No significant pericardial effusion. Mediastinum/Nodes: --No mediastinal or hilar lymphadenopathy. --No axillary lymphadenopathy. --No supraclavicular lymphadenopathy. --Normal thyroid gland. --The esophagus is unremarkable Lungs/Pleura: No pulmonary nodules or masses. No pleural effusion or pneumothorax. No focal airspace consolidation. No focal pleural abnormality. Musculoskeletal:  There is no evidence for rib fracture. There is an acute fracture of the T9 vertebral body. This fracture extends superiorly through both endplates and posteriorly into the posterior elements including the bilateral lamina. There is no significant retropulsion at this level. CT ABDOMEN AND PELVIS FINDINGS Hepatobiliary: The liver is normal. Normal gallbladder.There is no biliary ductal dilation. Pancreas: Normal contours without ductal dilatation. No peripancreatic fluid collection. Spleen: Unremarkable. Adrenals/Urinary Tract: --Adrenal glands: Unremarkable. --Right kidney/ureter: No hydronephrosis or radiopaque kidney stones. --Left kidney/ureter: No hydronephrosis or radiopaque kidney stones. --Urinary bladder: Unremarkable. Stomach/Bowel: --Stomach/Duodenum: No hiatal hernia or other gastric abnormality. Normal duodenal course and caliber. --Small bowel: Unremarkable. --Colon: There is scattered colonic diverticula without CT evidence for diverticulitis. --Appendix: Normal. Vascular/Lymphatic: Atherosclerotic calcification is present within the non-aneurysmal abdominal aorta, without hemodynamically significant stenosis. --No retroperitoneal lymphadenopathy. --No mesenteric lymphadenopathy. --No pelvic or inguinal lymphadenopathy. Reproductive: Unremarkable Other: No ascites or free air. There is a small fat containing umbilical hernia. Musculoskeletal. There is severe disc height loss at the L5-S1 level. There is no acute lumbar spine fracture. IMPRESSION: 1. Extensive left-sided scalp swelling with pockets of subcutaneous gas and a large scalp laceration. No acute displaced calvarial fracture. 2. No acute intracranial abnormality. 3. No acute fracture or malalignment of the cervical spine. 4. Acute T9 fracture involving the posterior elements consistent with a posterior tension band disruption. 5. No acute solid organ injury within the chest, abdomen, or pelvis. Aortic Atherosclerosis (ICD10-I70.0).  Electronically Signed   By: Constance Holster M.D.   On: 07/02/2019 15:46   CT CERVICAL SPINE WO CONTRAST  Result Date: 07/02/2019 CLINICAL DATA:  Pain status post fall from ladder. Laceration and bleeding to the back of the head. EXAM: CT HEAD WITHOUT CONTRAST CT CERVICAL SPINE WITHOUT CONTRAST CT CHEST, ABDOMEN AND PELVIS WITHOUT CONTRAST TECHNIQUE: Contiguous axial images were obtained from the base of the skull through the vertex without intravenous contrast. Multidetector CT imaging of the cervical spine was performed without intravenous contrast. Multiplanar CT image reconstructions were also generated. Multidetector CT imaging of the chest, abdomen and pelvis was performed following the standard protocol without IV contrast. COMPARISON:  CT of the pelvis dated October 22, 2018. PET-CT dated January 06, 2018. FINDINGS: CT HEAD FINDINGS Brain: No evidence of acute infarction, hemorrhage, hydrocephalus, extra-axial collection or mass lesion/mass effect. There is a small punctate density in the anterior left frontal lobe (axial series 4, image 25). This is favored to represent a small calcification as opposed to  subarachnoid hemorrhage. There are bilateral hyperdensities in the CP angles favored to represent artifact. Vascular: No hyperdense vessel or unexpected calcification. Skull: There is extensive left-sided scalp swelling with pockets of subcutaneous gas and a large scalp laceration. There is no acute displaced calvarial fracture. Sinuses/Orbits: No acute finding. Other: None. CT CERVICAL FINDINGS Alignment: Normal. Skull base and vertebrae: No acute fracture. No primary bone lesion or focal pathologic process. Soft tissues and spinal canal: No prevertebral fluid or swelling. No visible canal hematoma. Disc levels:  Mild disc height loss is noted in the cervical spine. Other: None. CT CHEST FINDINGS Cardiovascular: The heart size is unremarkable. There is a well-positioned right-sided Port-A-Cath in  place. There is no evidence for thoracic aortic dissection or aneurysm. There is no large centrally located pulmonary embolism. No significant pericardial effusion. Mediastinum/Nodes: --No mediastinal or hilar lymphadenopathy. --No axillary lymphadenopathy. --No supraclavicular lymphadenopathy. --Normal thyroid gland. --The esophagus is unremarkable Lungs/Pleura: No pulmonary nodules or masses. No pleural effusion or pneumothorax. No focal airspace consolidation. No focal pleural abnormality. Musculoskeletal: There is no evidence for rib fracture. There is an acute fracture of the T9 vertebral body. This fracture extends superiorly through both endplates and posteriorly into the posterior elements including the bilateral lamina. There is no significant retropulsion at this level. CT ABDOMEN AND PELVIS FINDINGS Hepatobiliary: The liver is normal. Normal gallbladder.There is no biliary ductal dilation. Pancreas: Normal contours without ductal dilatation. No peripancreatic fluid collection. Spleen: Unremarkable. Adrenals/Urinary Tract: --Adrenal glands: Unremarkable. --Right kidney/ureter: No hydronephrosis or radiopaque kidney stones. --Left kidney/ureter: No hydronephrosis or radiopaque kidney stones. --Urinary bladder: Unremarkable. Stomach/Bowel: --Stomach/Duodenum: No hiatal hernia or other gastric abnormality. Normal duodenal course and caliber. --Small bowel: Unremarkable. --Colon: There is scattered colonic diverticula without CT evidence for diverticulitis. --Appendix: Normal. Vascular/Lymphatic: Atherosclerotic calcification is present within the non-aneurysmal abdominal aorta, without hemodynamically significant stenosis. --No retroperitoneal lymphadenopathy. --No mesenteric lymphadenopathy. --No pelvic or inguinal lymphadenopathy. Reproductive: Unremarkable Other: No ascites or free air. There is a small fat containing umbilical hernia. Musculoskeletal. There is severe disc height loss at the L5-S1 level.  There is no acute lumbar spine fracture. IMPRESSION: 1. Extensive left-sided scalp swelling with pockets of subcutaneous gas and a large scalp laceration. No acute displaced calvarial fracture. 2. No acute intracranial abnormality. 3. No acute fracture or malalignment of the cervical spine. 4. Acute T9 fracture involving the posterior elements consistent with a posterior tension band disruption. 5. No acute solid organ injury within the chest, abdomen, or pelvis. Aortic Atherosclerosis (ICD10-I70.0). Electronically Signed   By: Constance Holster M.D.   On: 07/02/2019 15:46   MR THORACIC SPINE WO CONTRAST  Result Date: 07/02/2019 CLINICAL DATA:  Spine fracture.  Preop. EXAM: MRI THORACIC SPINE WITHOUT CONTRAST TECHNIQUE: Multiplanar, multisequence MR imaging of the thoracic spine was performed. No intravenous contrast was administered. COMPARISON:  None. FINDINGS: Alignment: Mild degenerative anterolisthesis at T1-2. No traumatic malalignment. Exaggerated thoracic kyphosis. Vertebrae: Marrow edema within the T9 body with height loss measuring 60% when compared to T8. Minimal posterior cortex buckling. There is anterior displacement of vertebral body. T4 superior endplate with mild depression and marrow edema. No evidence of bone lesion. Cord:  No evidence of injury.  No intrathecal collection. Paraspinal and other soft tissues: Paravertebral edema at the level of T9 fracture. There is strain of intrinsic back muscles at the level of T9, extending inferiorly and leftward. Subcutaneous contusion and mild muscular strain also seen at the upper thoracic levels. Disc levels: No significant disc degeneration or  degenerative impingement. IMPRESSION: 1. T9 body fracture with 60% height loss. 2. T4 superior endplate fracture with minimal depression. 3. Back muscle strain without major ligamentous disruption. Electronically Signed   By: Monte Fantasia M.D.   On: 07/02/2019 20:50   CT ABDOMEN PELVIS W CONTRAST  Result  Date: 07/02/2019 CLINICAL DATA:  Pain status post fall from ladder. Laceration and bleeding to the back of the head. EXAM: CT HEAD WITHOUT CONTRAST CT CERVICAL SPINE WITHOUT CONTRAST CT CHEST, ABDOMEN AND PELVIS WITHOUT CONTRAST TECHNIQUE: Contiguous axial images were obtained from the base of the skull through the vertex without intravenous contrast. Multidetector CT imaging of the cervical spine was performed without intravenous contrast. Multiplanar CT image reconstructions were also generated. Multidetector CT imaging of the chest, abdomen and pelvis was performed following the standard protocol without IV contrast. COMPARISON:  CT of the pelvis dated October 22, 2018. PET-CT dated January 06, 2018. FINDINGS: CT HEAD FINDINGS Brain: No evidence of acute infarction, hemorrhage, hydrocephalus, extra-axial collection or mass lesion/mass effect. There is a small punctate density in the anterior left frontal lobe (axial series 4, image 25). This is favored to represent a small calcification as opposed to subarachnoid hemorrhage. There are bilateral hyperdensities in the CP angles favored to represent artifact. Vascular: No hyperdense vessel or unexpected calcification. Skull: There is extensive left-sided scalp swelling with pockets of subcutaneous gas and a large scalp laceration. There is no acute displaced calvarial fracture. Sinuses/Orbits: No acute finding. Other: None. CT CERVICAL FINDINGS Alignment: Normal. Skull base and vertebrae: No acute fracture. No primary bone lesion or focal pathologic process. Soft tissues and spinal canal: No prevertebral fluid or swelling. No visible canal hematoma. Disc levels:  Mild disc height loss is noted in the cervical spine. Other: None. CT CHEST FINDINGS Cardiovascular: The heart size is unremarkable. There is a well-positioned right-sided Port-A-Cath in place. There is no evidence for thoracic aortic dissection or aneurysm. There is no large centrally located pulmonary  embolism. No significant pericardial effusion. Mediastinum/Nodes: --No mediastinal or hilar lymphadenopathy. --No axillary lymphadenopathy. --No supraclavicular lymphadenopathy. --Normal thyroid gland. --The esophagus is unremarkable Lungs/Pleura: No pulmonary nodules or masses. No pleural effusion or pneumothorax. No focal airspace consolidation. No focal pleural abnormality. Musculoskeletal: There is no evidence for rib fracture. There is an acute fracture of the T9 vertebral body. This fracture extends superiorly through both endplates and posteriorly into the posterior elements including the bilateral lamina. There is no significant retropulsion at this level. CT ABDOMEN AND PELVIS FINDINGS Hepatobiliary: The liver is normal. Normal gallbladder.There is no biliary ductal dilation. Pancreas: Normal contours without ductal dilatation. No peripancreatic fluid collection. Spleen: Unremarkable. Adrenals/Urinary Tract: --Adrenal glands: Unremarkable. --Right kidney/ureter: No hydronephrosis or radiopaque kidney stones. --Left kidney/ureter: No hydronephrosis or radiopaque kidney stones. --Urinary bladder: Unremarkable. Stomach/Bowel: --Stomach/Duodenum: No hiatal hernia or other gastric abnormality. Normal duodenal course and caliber. --Small bowel: Unremarkable. --Colon: There is scattered colonic diverticula without CT evidence for diverticulitis. --Appendix: Normal. Vascular/Lymphatic: Atherosclerotic calcification is present within the non-aneurysmal abdominal aorta, without hemodynamically significant stenosis. --No retroperitoneal lymphadenopathy. --No mesenteric lymphadenopathy. --No pelvic or inguinal lymphadenopathy. Reproductive: Unremarkable Other: No ascites or free air. There is a small fat containing umbilical hernia. Musculoskeletal. There is severe disc height loss at the L5-S1 level. There is no acute lumbar spine fracture. IMPRESSION: 1. Extensive left-sided scalp swelling with pockets of  subcutaneous gas and a large scalp laceration. No acute displaced calvarial fracture. 2. No acute intracranial abnormality. 3. No acute fracture or malalignment  of the cervical spine. 4. Acute T9 fracture involving the posterior elements consistent with a posterior tension band disruption. 5. No acute solid organ injury within the chest, abdomen, or pelvis. Aortic Atherosclerosis (ICD10-I70.0). Electronically Signed   By: Constance Holster M.D.   On: 07/02/2019 15:46     ASSESSMENT: 69 y.o.  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 rigors 02/27/2014: received IV vancomycin and levaquin briefly, then levaquin po (pcn allergy)  (6) bendamustine/ rituximab for 6 cycles, completed 05/04/2014, with improvement but not normalization of the absolute lymphocyte count  (7) ibrutinib started 05/08/2014, currently at 420mg  daily. Stopped by patient in June, 2019 restarted in September, 2019, discontinued October 2020  (a) hepatitis B studies 03/06/2014 and 12/31/2016, negative  (b) ibrutinib restarted 01/06/2019 (at 280 mg daily) in conjunction with venetoclax  (8) CT abd/pelvis 10/22/2018 shows retroperitoneal adenopathy, increased splenomegaly  (a) repeat CT scan of the neck chest abdomen and pelvis 07/02/2019, with resolution of the previously noted adenopathy and  splenomegaly.  (9) started venetoclax 12/15/2018  (a) uneventful ramp-up to the 100 mg a day dose, then held at that dose due to symptoms   PLAN: Rachel [name] is a 19-year history of chronic lymphoid leukemia and when we last scanned her in August 2020 there was evidence of disease progression, with increased retroperitoneal adenopathy and splenomegaly.  Since that time she was started on venetoclax.  She just had CT scans of the neck chest abdomen and pelvis earlier this month to make sure there were no other areas of fracture after her fall.  The good news is that all the adenopathy has resolved as has the splenomegaly.  I am quite delighted with those results.  She is still in a fair amount of pain and is having trouble getting through her physical therapy.  She will be seeing Dr. Marcello Moores again in a month and at that time if she has not progressed further perhaps they can consider kyphoplasty if he thinks that is possible.  She thinks she fell because of allopurinol which her sister tells her causes people to lose use of their legs.  I am not sure where that information is coming from but the reason for her fall of course is her vertigo and going up on a ladder with vertigo is just not a good idea.  I am placing a referral to our physical therapist to see if they can resolve the vertigo issue for her.  Otherwise I am continuing the CLL treatment as before and she will see me again in 3 months  Total encounter time 35 minutes.Rachel Jews C. Felisia Balcom, MD 07/18/19 10:22 AM Medical Oncology and Hematology [facility] [place], [place] [address] Tel. [phone]    Fax. [phone]   I, Wilburn Mylar, am acting as scribe for Dr. Virgie Dad. Rachel [name].  Lindie Spruce MD, have reviewed the above documentation for accuracy and completeness, and  I agree with the above.   *Total Encounter Time as defined by the Centers for Medicare and Medicaid Services includes, in  addition to the face-to-face time of a patient visit (documented in the note above) non-face-to-face time: obtaining and reviewing outside history, ordering and reviewing medications, tests or procedures, care coordination (communications with other health care professionals or caregivers) and documentation in the medical record.

## 2019-07-18 ENCOUNTER — Other Ambulatory Visit: Payer: Self-pay

## 2019-07-18 ENCOUNTER — Inpatient Hospital Stay: Payer: Medicare Other | Attending: Oncology | Admitting: Oncology

## 2019-07-18 ENCOUNTER — Inpatient Hospital Stay: Payer: Medicare Other

## 2019-07-18 VITALS — BP 95/48 | HR 107 | Temp 99.1°F | Resp 18 | Wt 115.1 lb

## 2019-07-18 DIAGNOSIS — Z888 Allergy status to other drugs, medicaments and biological substances status: Secondary | ICD-10-CM | POA: Diagnosis not present

## 2019-07-18 DIAGNOSIS — C9110 Chronic lymphocytic leukemia of B-cell type not having achieved remission: Secondary | ICD-10-CM

## 2019-07-18 DIAGNOSIS — Z9225 Personal history of immunosupression therapy: Secondary | ICD-10-CM | POA: Diagnosis not present

## 2019-07-18 DIAGNOSIS — R42 Dizziness and giddiness: Secondary | ICD-10-CM | POA: Insufficient documentation

## 2019-07-18 DIAGNOSIS — B079 Viral wart, unspecified: Secondary | ICD-10-CM | POA: Diagnosis not present

## 2019-07-18 DIAGNOSIS — Z79899 Other long term (current) drug therapy: Secondary | ICD-10-CM | POA: Insufficient documentation

## 2019-07-18 DIAGNOSIS — I700 Atherosclerosis of aorta: Secondary | ICD-10-CM | POA: Diagnosis not present

## 2019-07-18 DIAGNOSIS — C8300 Small cell B-cell lymphoma, unspecified site: Secondary | ICD-10-CM

## 2019-07-18 DIAGNOSIS — Z9221 Personal history of antineoplastic chemotherapy: Secondary | ICD-10-CM | POA: Diagnosis not present

## 2019-07-18 DIAGNOSIS — J4520 Mild intermittent asthma, uncomplicated: Secondary | ICD-10-CM | POA: Diagnosis not present

## 2019-07-18 DIAGNOSIS — S22079D Unspecified fracture of T9-T10 vertebra, subsequent encounter for fracture with routine healing: Secondary | ICD-10-CM | POA: Diagnosis not present

## 2019-07-18 DIAGNOSIS — S0101XD Laceration without foreign body of scalp, subsequent encounter: Secondary | ICD-10-CM | POA: Diagnosis not present

## 2019-07-18 DIAGNOSIS — S22049D Unspecified fracture of fourth thoracic vertebra, subsequent encounter for fracture with routine healing: Secondary | ICD-10-CM | POA: Diagnosis not present

## 2019-07-18 LAB — CBC WITH DIFFERENTIAL (CANCER CENTER ONLY)
Abs Immature Granulocytes: 0.18 10*3/uL — ABNORMAL HIGH (ref 0.00–0.07)
Basophils Absolute: 0 10*3/uL (ref 0.0–0.1)
Basophils Relative: 1 %
Eosinophils Absolute: 0.1 10*3/uL (ref 0.0–0.5)
Eosinophils Relative: 1 %
HCT: 38.5 % (ref 36.0–46.0)
Hemoglobin: 12.4 g/dL (ref 12.0–15.0)
Immature Granulocytes: 4 %
Lymphocytes Relative: 22 %
Lymphs Abs: 1.1 10*3/uL (ref 0.7–4.0)
MCH: 28.9 pg (ref 26.0–34.0)
MCHC: 32.2 g/dL (ref 30.0–36.0)
MCV: 89.7 fL (ref 80.0–100.0)
Monocytes Absolute: 0.5 10*3/uL (ref 0.1–1.0)
Monocytes Relative: 10 %
Neutro Abs: 3 10*3/uL (ref 1.7–7.7)
Neutrophils Relative %: 62 %
Platelet Count: 176 10*3/uL (ref 150–400)
RBC: 4.29 MIL/uL (ref 3.87–5.11)
RDW: 13.1 % (ref 11.5–15.5)
WBC Count: 4.9 10*3/uL (ref 4.0–10.5)
nRBC: 0 % (ref 0.0–0.2)

## 2019-07-18 LAB — MAGNESIUM: Magnesium: 1.9 mg/dL (ref 1.7–2.4)

## 2019-07-18 LAB — CMP (CANCER CENTER ONLY)
ALT: 100 U/L — ABNORMAL HIGH (ref 0–44)
AST: 72 U/L — ABNORMAL HIGH (ref 15–41)
Albumin: 3.8 g/dL (ref 3.5–5.0)
Alkaline Phosphatase: 251 U/L — ABNORMAL HIGH (ref 38–126)
Anion gap: 10 (ref 5–15)
BUN: 13 mg/dL (ref 8–23)
CO2: 27 mmol/L (ref 22–32)
Calcium: 9.3 mg/dL (ref 8.9–10.3)
Chloride: 103 mmol/L (ref 98–111)
Creatinine: 0.91 mg/dL (ref 0.44–1.00)
GFR, Est AFR Am: >60 mL/min (ref >60)
GFR, Estimated: >60 mL/min (ref >60)
Glucose, Bld: 120 mg/dL — ABNORMAL HIGH (ref 70–99)
Potassium: 4.3 mmol/L (ref 3.5–5.1)
Sodium: 140 mmol/L (ref 135–145)
Total Bilirubin: 0.4 mg/dL (ref 0.3–1.2)
Total Protein: 6.5 g/dL (ref 6.5–8.1)

## 2019-07-18 LAB — LACTATE DEHYDROGENASE: LDH: 384 U/L — ABNORMAL HIGH (ref 98–192)

## 2019-07-19 ENCOUNTER — Telehealth: Payer: Self-pay | Admitting: Oncology

## 2019-07-19 LAB — BETA 2 MICROGLOBULIN, SERUM: Beta-2 Microglobulin: 3.8 mg/L — ABNORMAL HIGH (ref 0.6–2.4)

## 2019-07-19 MED FILL — VENCLEXTA 100 MG TABS: 100 | 30 days supply | Qty: 30 | Fill #6

## 2019-07-19 NOTE — Telephone Encounter (Signed)
Scheduled appts per 5/24 los. Pt confirmed appt date and time.

## 2019-07-21 DIAGNOSIS — J0101 Acute recurrent maxillary sinusitis: Secondary | ICD-10-CM | POA: Diagnosis not present

## 2019-07-22 DIAGNOSIS — S22079D Unspecified fracture of T9-T10 vertebra, subsequent encounter for fracture with routine healing: Secondary | ICD-10-CM | POA: Diagnosis not present

## 2019-07-22 DIAGNOSIS — S22049D Unspecified fracture of fourth thoracic vertebra, subsequent encounter for fracture with routine healing: Secondary | ICD-10-CM | POA: Diagnosis not present

## 2019-07-22 DIAGNOSIS — S0101XD Laceration without foreign body of scalp, subsequent encounter: Secondary | ICD-10-CM | POA: Diagnosis not present

## 2019-07-22 DIAGNOSIS — I700 Atherosclerosis of aorta: Secondary | ICD-10-CM | POA: Diagnosis not present

## 2019-07-22 DIAGNOSIS — J4520 Mild intermittent asthma, uncomplicated: Secondary | ICD-10-CM | POA: Diagnosis not present

## 2019-07-22 DIAGNOSIS — C9110 Chronic lymphocytic leukemia of B-cell type not having achieved remission: Secondary | ICD-10-CM | POA: Diagnosis not present

## 2019-07-28 DIAGNOSIS — C9110 Chronic lymphocytic leukemia of B-cell type not having achieved remission: Secondary | ICD-10-CM | POA: Diagnosis not present

## 2019-07-28 DIAGNOSIS — S0101XD Laceration without foreign body of scalp, subsequent encounter: Secondary | ICD-10-CM | POA: Diagnosis not present

## 2019-07-28 DIAGNOSIS — S22079D Unspecified fracture of T9-T10 vertebra, subsequent encounter for fracture with routine healing: Secondary | ICD-10-CM | POA: Diagnosis not present

## 2019-07-28 DIAGNOSIS — J4520 Mild intermittent asthma, uncomplicated: Secondary | ICD-10-CM | POA: Diagnosis not present

## 2019-07-28 DIAGNOSIS — I700 Atherosclerosis of aorta: Secondary | ICD-10-CM | POA: Diagnosis not present

## 2019-07-28 DIAGNOSIS — S22049D Unspecified fracture of fourth thoracic vertebra, subsequent encounter for fracture with routine healing: Secondary | ICD-10-CM | POA: Diagnosis not present

## 2019-08-01 DIAGNOSIS — S22070D Wedge compression fracture of T9-T10 vertebra, subsequent encounter for fracture with routine healing: Secondary | ICD-10-CM | POA: Diagnosis not present

## 2019-08-02 ENCOUNTER — Other Ambulatory Visit: Payer: Self-pay

## 2019-08-02 ENCOUNTER — Telehealth: Payer: Self-pay

## 2019-08-02 ENCOUNTER — Telehealth: Payer: Self-pay | Admitting: Internal Medicine

## 2019-08-02 ENCOUNTER — Ambulatory Visit: Payer: Medicare Other | Attending: Oncology

## 2019-08-02 DIAGNOSIS — M542 Cervicalgia: Secondary | ICD-10-CM | POA: Insufficient documentation

## 2019-08-02 DIAGNOSIS — H8112 Benign paroxysmal vertigo, left ear: Secondary | ICD-10-CM | POA: Diagnosis present

## 2019-08-02 DIAGNOSIS — R2689 Other abnormalities of gait and mobility: Secondary | ICD-10-CM | POA: Diagnosis present

## 2019-08-02 NOTE — Patient Instructions (Signed)
Post-Treatment Instructions for Patients After In-Office Epley or Semont Maneuvers  After either of these maneuvers, you should be prepared to follow the instructions below, which are aimed at reducing the chance that debris might fall back into the sensitive back part of the ear.  Wait for 10 minutes after the maneuver is performed before going home. This is to avoid "quick spins", or brief bursts of vertigo as debris repositions itself immediately after the maneuver. Don't drive yourself home.   Sleep semi-recumbent for one night. This means sleep with your head halfway between being flat and upright (a 45 degree angle). This is most easily done by using a recliner chair or by using pillows arranged on a couch.   During the day, try to keep your head vertical. You must not go to the hairdresser or dentist, or engage in exercise that requires head movement. When men shave under their chins, they should bend their bodies forward in order to keep their head vertical. If eye drops are required, try to put them in without tilting the head back. Shampoo only under the shower.   For at least one week, avoid provoking head positions that might bring BPPV on again: Use two pillows when you sleep Avoid sleeping on the "bad" side (Left) Don't turn your head far up or far down    Be careful to avoid the head-extended position, in which you are lying on your back, especially with your head turned toward the affected side. This means that you should be cautious at the beauty parlor, dentist's office, and while undergoing minor surgery. Try to stay as upright as possible. Exercises for low-back pain should be stopped for one week. No sit-ups should be done for at least one week and no "crawl" swimming (Breast stroke is permitted). Also avoid far head-forward positions that might occur in certain exercises (for example, touching the toes).

## 2019-08-02 NOTE — Telephone Encounter (Signed)
Pt called with concerns about ongoing back pain/discomfort and seeks advice from Dr Jannifer Rodney. Pt states she feels the neurosurgeon she saw is "too young and inexperienced" to perform "cement surgery on my back". Pt states she would not be able to have the surgery for 6 mos and is looking for relief and a new Psychologist, sport and exercise. Dr Jana Hakim suggested pt contact Dr. Kristeen Miss for 2nd opinion. Pt verbalized thanks and understanding for recommendation and states she will call them for 2nd opinion.

## 2019-08-02 NOTE — Therapy (Signed)
[place], [place], [address] Phone: [phone]   Fax:  [phone]  Physical Therapy Evaluation  Patient Details  Name: Rachel [name] MRN: [id number] Date of Birth: [birth year] Referring Provider (PT): Lurline Del MD   Encounter Date: 08/02/2019  PT End of Session - 08/02/19 1034    Visit Number  1    Number of Visits  9    Date for PT Re-Evaluation  10/04/19    PT Start Time  0806    PT Stop Time  0905    PT Time Calculation (min)  59 min    Activity Tolerance  Patient tolerated treatment well    Behavior During Therapy  [facility] for tasks assessed/performed       Past Medical History:  Diagnosis Date  . 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 Diverticulitis   . Leukemia ([place])   . Lower back pain     Past Surgical History:  Procedure Laterality Date  . BREAST SURGERY  2015   left breast bx  . BUNIONECTOMY     right foot  . lining of uterus removed    . OTHER SURGICAL HISTORY     removal of uterine lining  . TONSILLECTOMY      There were no vitals filed for this visit.   Subjective Assessment - 08/02/19 0818    Subjective  Pt states that she had vertigo previously about 4-5 years ago that lasted for a while becuase her mother suffers from it as well. She states that it lasted for several years but then seemed to resolve. She states that recently she has had vertigo when she lays down in bed and gets up really fast which seems to have gotten worse since she had a fall about 1 month ago. She fractured her back and ended up in the emergency room at [facility]. She had another fall a few months before when there was a small step and she doesn't know what happened but she missed a small step and fell on the floor. She states that her depth perception seemed to be off becuase she was knocking over her coffee cups and other objects inbetween the falls. Pt states that last week she had an  ear infection in her L ear and now it is in her R ear.    Pertinent History  Chronic lymphocytic leukemia    Patient Stated Goals  I just want to not have that feeling of dizziness.    Currently in Pain?  Yes   0/10 dizziness at the moment, when happen lasts couple sec   Pain Score  5     Pain Location  Back    Pain Orientation  Lower    Pain Descriptors / Indicators  Aching    Pain Type  Acute pain    Pain Onset  1 to 4 weeks ago    Pain Frequency  Intermittent    Aggravating Factors   once she is up and walking around    Pain Relieving Factors  resting         [facility] PT Assessment - 08/02/19 0001      Assessment   Medical Diagnosis       Referring Provider (PT)  Lurline Del MD    Hand Dominance  Right    Prior Therapy  For her back       Precautions   Precautions  Back    Precaution Booklet Issued  No  Precaution Comments  No bending, lifting or twisting pt is wearing a hard turtle shell due back fracture    Required Braces or Orthoses  Spinal Brace    Spinal Brace  Thoracolumbosacral orthotic      Balance Screen   Has the patient fallen in the past 6 months  Yes    How many times?  2    Has the patient had a decrease in activity level because of a fear of falling?   Yes    Is the patient reluctant to leave their home because of a fear of falling?   No      Home Environment   Living Environment  Private residence    Living Arrangements  Spouse/significant other    Type of [place] to enter    Entrance Stairs-Number of Steps  6    Entrance Stairs-Rails  Cannot reach both    [place]  Two level    Alternate Level Stairs-Number of Steps  13    Alternate Level Stairs-Rails  Can reach both      Prior Function   Level of Independence  Independent with basic ADLs    Vocation  Retired    Leisure  puzzles, sewing, Academic librarian   Overall Cognitive Status  Within Functional Limits for tasks assessed      Observation/Other  Assessments   Observations  Pt is using a rolling walker .      Posture/Postural Control   Posture/Postural Control  Postural limitations    Postural Limitations  Rounded Shoulders           VOR: pt reports diploplia and that she feels "weird"  VOR cancellation: lightheadedness (modified to prevent lumbar/thoracic rotation) pt in standing stepping with visual fixation Conversion: Pt reports slight increase in vertigo symptoms.  Smooth Tracking: WFL  Saccades: pt has 1-2 bounce nystagmus bil with L > R  Dix-Hallpike: modified with support at the lumbar spine and pt stabilizing with hands on therapists arm looking to the R with no symptoms and looking to the L with brief vertigo: no nystagmus noted. Went into [employer] with pt reporting dizziness in the first 2 positions and no dizziness upon sitting.        Objective measurements completed on examination: See above findings.              PT Education - 08/02/19 1031    Education Details  Pt was educated on the anatomy and physiology of the vestibular system. She was educated on BPPV vs. VOR vs. central balance issue. Discussed possible imbalance related to current ear infection. Pt was educated on findings including deficits in VOR and with BPPV on the L. No visual nystagmus present but pt has positional vertigo that lasts less than 30 seconds when performing Dix-Hallpike to the R in compared to the L and pt was educated on these findings. Pt was educated on directions to following following Epley Maneuver for effectiveness at home within spinal precautions due to pt is under no bending, lifting or twisting precautions.    Person(s) Educated  Patient    Methods  Explanation    Comprehension  Verbalized understanding       PT Short Term Goals - 08/02/19 1043      PT SHORT TERM GOAL #1   Title  Pt will follow modified directions for the Epley including sleeping on 2 pillows for 2  nights, avoiding extreme head  movements and sleeping on the L side for 1 weeks.    Baseline  pt was provided with instructions today.    Time  1    Period  Weeks    Status  New    Target Date  08/16/19        PT Long Term Goals - 08/02/19 1045      PT LONG TERM GOAL #1   Title  Pt will demonstrate no reports of vertigo with L Dix-hallpike maneuver within 4 weeks to demonstrate improved positional vertigo related to BPPV.    Baseline  pt reports vertigo with L Dix-Hallpike    Time  4    Period  Weeks    Status  New    Target Date  09/06/19      PT LONG TERM GOAL #2   Title  Pt will be able to perform directional changes of the head in standing without UE support both vertical and horizontal while walking without loss of balance within 8 weeks.    Baseline  Currently pt is using a RW and repots light headedness with VOR, convergence and saccades.    Time  8    Period  Weeks    Status  New    Target Date  10/04/19      PT LONG TERM GOAL #3   Title  Pt will report 50% improvement in pain/stiffness at the cervical spine within 8 weeks in order to improve pt cervical ROM to prevent falls and improve functional movement.    Baseline  5/10 pain and stiffness    Time  8    Period  Weeks    Status  New    Target Date  10/04/19      PT LONG TERM GOAL #4   Title  Pt will perform TUG without AD within time frame for decreased risk for falls within 8 weeks in order to demonstrate objective decrease in risk for falls.    Baseline  not performed today due to time limitations pt uses RW    Time  8    Period  Weeks    Status  New    Target Date  10/04/19             Plan - 08/02/19 1035    Clinical Impression Statement  Pt presents to physical therapy with reports of vertigo when she sits up quickly or lays down on her back. She has had 2 falls within the past 6 months both when looking down at a step and then when looking down in the attic. She fell down the stairs and hip her head and fractured her spine from  the fall in the attic and is now on lumbar precautions for no bending, lifting, or twisting and has a thoraco-lumbar brace. Pt demonstrates no difficulty with visual tracking, increased reports of light headedness with VOR cancellation, VOR, saccades, and convergence demonstrating most likely VOR deficits related to BPPV. Pt had a positive Dix-Hallpike on the L with reports of vertigo though no nystagmus was noted most likely due to compensations that had to be performed including pt LB support and slow lowering secondary to spinal precautions. Epley was performed this session for the L with pt maintaining spinal precautions throughout and assisted by the physical therapist. She reports dizziness in position 2 and 3 and no dizziness when back to upright sitting. Pt demonstrated cervical stiffness and reports pain in this area most likely  related to hitting her head in her most recent fall which was 4 weeks ago. Pt will benefit from skilled physical therapy services 1x/week for 8 weeks in order to address the above limitations to decrease fall risk and risk for further injury.    Personal Factors and Comorbidities  Comorbidity 1    Comorbidities  Pt has fractured her back and is under no bending, lifting or twisting precautions wearing a thoraco-lumbar brace.    Stability/Clinical Decision Making  Stable/Uncomplicated    Clinical Decision Making  Low    Rehab Potential  Good    PT Frequency  1x / week    PT Duration  8 weeks    PT Treatment/Interventions  Iontophoresis 4mg /ml Dexamethasone;Therapeutic activities;Therapeutic exercise;Neuromuscular re-education;Patient/family education;Manual techniques    PT Next Visit Plan  Do TUG, Assess Dix-Hallpike, start VOR activities, balance, cervical ROM, STM to tight cervical muscles    PT Home Exercise Plan  precautions for Epley sleep on 2 pillows for 2 nights, no extreme head positions or sleeping on the L side for 1 week. Modified for pt    Consulted and Agree  with Plan of Care  Patient       Patient will benefit from skilled therapeutic intervention in order to improve the following deficits and impairments:  Decreased safety awareness, Pain, Difficulty walking, Decreased balance, Abnormal gait, Dizziness  Visit Diagnosis: Abnormality of gait due to impairment of balance - Plan: PT plan of care cert/re-cert  Cervicalgia - Plan: PT plan of care cert/re-cert  BPPV (benign paroxysmal positional vertigo), left - Plan: PT plan of care cert/re-cert     Problem List Patient Active Problem List   Diagnosis Date Noted  . T9 vertebral fracture ([place]) 07/02/2019  . Aortic atherosclerosis ([place]) 01/07/2018  . Chronic rhinitis 11/03/2017  . Mild intermittent asthma 11/03/2017  . History of food allergy 11/03/2017  . Lymphoma, small lymphocytic ([place]) 10/05/2017  . Bruising 05/12/2016  . Thrombocytopenia ([place]) 05/12/2016  . Port catheter in place 10/19/2015  . Dehydration 06/26/2015  . Transaminitis 06/26/2015  . Nausea with vomiting 06/26/2015  . Diverticulitis 06/25/2015  . Vaginal discharge 01/31/2014  . Low grade squamous intraepithelial lesion (LGSIL) on Papanicolaou smear of cervix 01/31/2014  . Headache disorder 01/05/2014  . CLL (chronic lymphocytic leukemia) ([place]) 07/17/2011    Ander Purpura, PT 08/02/2019, 10:52 AM  [place], [place], [address] Phone: [phone]   Fax:  [phone]  Name: Kalilah Barua Beller MRN: [id number] Date of Birth: 06/22/1950

## 2019-08-02 NOTE — Telephone Encounter (Signed)
   Rachel [name] DOB: 1950/04/21 MRN: [id number]   RIDER WAIVER AND RELEASE OF LIABILITY  For purposes of improving physical access to our facilities, [facility] is pleased to partner with third parties to provide [facility] patients or other authorized individuals the option of convenient, on-demand ground transportation services (the [place]") through use of the technology service that enables users to request on-demand ground transportation from independent third-party providers.  By opting to use and accept these [employer], I, the undersigned, hereby agree on behalf of myself, and on behalf of any minor child using the [employer] for whom I am the parent or legal guardian, as follows:  1. Government social research officer provided to me are provided by independent third-party transportation providers who are not [employer] or employees and who are unaffiliated with [employer]. 2. [facility] is neither a transportation carrier nor a common or public carrier. 3. [facility] has no control over the quality or safety of the transportation that occurs as a result of the [employer]. 4. [facility] cannot guarantee that any third-party transportation provider will complete any arranged transportation service. 5. [facility] makes no representation, warranty, or guarantee regarding the reliability, timeliness, quality, safety, suitability, or availability of any of the Transport Services or that they will be error free. 6. I fully understand that traveling by vehicle involves risks and dangers of serious bodily injury, including permanent disability, paralysis, and death. I agree, on behalf of myself and on behalf of any minor child using the Transport Services for whom I am the parent or legal guardian, that the entire risk arising out of my use of the [employer] remains solely with me, to the maximum extent permitted under applicable law. 7. The [employer] are provided "as is" and "as available." [facility] disclaims all representations and warranties, express, implied or statutory, not expressly set out in these terms, including the implied warranties of merchantability and fitness for a particular purpose. 8. I hereby waive and release [facility], its agents, employees, officers, directors, representatives, insurers, attorneys, assigns, successors, subsidiaries, and affiliates from any and all past, present, or future claims, demands, liabilities, actions, causes of action, or suits of any kind directly or indirectly arising from acceptance and use of the [employer]. 9. I further waive and release [facility] and its affiliates from all present and future liability and responsibility for any injury or death to persons or damages to property caused by or related to the use of the [employer]. 10. I have read this Waiver and Release of Liability, and I understand the terms used in it and their legal significance. This Waiver is freely and voluntarily given with the understanding that my right (as well as the right of any minor child for whom I am the parent or legal guardian using the [employer]) to legal recourse against [facility] in connection with the [employer] is knowingly surrendered in return for use of these services.   I attest that I read the consent document to Rachel [name], gave Rachel [name] the opportunity to ask questions and answered the questions asked (if any). I affirm that Rachel [name] then provided consent for she's participation in this program.     Rachel [name]

## 2019-08-05 ENCOUNTER — Ambulatory Visit (INDEPENDENT_AMBULATORY_CARE_PROVIDER_SITE_OTHER): Payer: Medicare Other | Admitting: Otolaryngology

## 2019-08-05 ENCOUNTER — Encounter (INDEPENDENT_AMBULATORY_CARE_PROVIDER_SITE_OTHER): Payer: Self-pay | Admitting: Otolaryngology

## 2019-08-05 ENCOUNTER — Other Ambulatory Visit: Payer: Self-pay

## 2019-08-05 VITALS — Temp 98.1°F

## 2019-08-05 DIAGNOSIS — J310 Chronic rhinitis: Secondary | ICD-10-CM | POA: Diagnosis not present

## 2019-08-05 DIAGNOSIS — S22079A Unspecified fracture of T9-T10 vertebra, initial encounter for closed fracture: Secondary | ICD-10-CM | POA: Diagnosis not present

## 2019-08-05 NOTE — Progress Notes (Signed)
HPI: Rachel [name] is a 69 y.o. female who presents is referred by her PCP for evaluation of ear and sinus infections.  Patient states that she has been having recurrent ear infections as well as recurrent sinus infections.  She just recently finished an antibiotic.  Apparently she fell last month and had a CT scan of her head and I reviewed this and this showed clear paranasal sinuses as well as clear middle ear space and clear mastoids bilaterally. She does not feel like she has a ear infection presently.. She does have history of allergies and uses Flonase, azelastine and Singulair.  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   Diverticulitis    Leukemia ([place])    Lower back pain    Past Surgical History:  Procedure Laterality Date   BREAST SURGERY  2015   left breast bx   BUNIONECTOMY     right foot   lining of uterus removed     OTHER SURGICAL HISTORY     removal of uterine lining   TONSILLECTOMY     Social History   Socioeconomic History   Marital status: Married    Spouse name: Not on file   Number of children: Not on file   Years of education: Not on file   Highest education level: Not on file  Occupational History   Not on file  Tobacco Use   Smoking status: Never Smoker   Smokeless tobacco: Never Used  Vaping Use   Vaping Use: Never used  Substance and Sexual Activity   Alcohol use: Yes    Alcohol/week: 0.0 standard drinks    Comment: socially   Drug use: No   Sexual activity: Not Currently    Partners: Male    Birth control/protection: None  Other Topics Concern   Not on file  Social History Narrative   Not on file   Social Determinants of Health   Financial Resource Strain:    Difficulty of Paying Living Expenses:   Food Insecurity:    Worried About Charity fundraiser in the Last Year:    Arboriculturist in the Last Year:   Transportation Needs:    Film/video editor  (Medical):    Lack of Transportation (Non-Medical):   Physical Activity:    Days of Exercise per Week:    Minutes of Exercise per Session:   Stress:    Feeling of Stress :   Social Connections:    Frequency of Communication with Friends and Family:    Frequency of Social Gatherings with Friends and Family:    Attends Religious Services:    Active Member of Clubs or Organizations:    Attends Music therapist:    Marital Status:    Family History  Problem Relation Age of Onset   Depression Son    Allergies  Allergen Reactions   Penicillins Anaphylaxis    "stopped breathing" "drops my heartbeat" "I just pass out"   Prior to Admission medications   Medication Sig Start Date End Date Taking? Authorizing Provider  acetaminophen (TYLENOL) 500 MG tablet Take 500-1,000 mg by mouth every 6 (six) hours as needed for headache.    Yes [provider]  acyclovir (ZOVIRAX) 400 MG tablet Take 1 tablet (400 mg total) by mouth 2 (two) times daily. 07/13/18  Yes Magrinat, Virgie Dad, MD  albuterol (VENTOLIN HFA) 108 (90 Base) MCG/ACT inhaler Inhale 1-2 puffs into the lungs every 6 (six)  hours as needed for shortness of breath or wheezing. 06/08/19  Yes [provider]  allopurinol (ZYLOPRIM) 300 MG tablet TAKE 1 TABLET DAILY Patient taking differently: Take 300 mg by mouth daily.  06/14/19  Yes Magrinat, Virgie Dad, MD  azelastine (ASTELIN) 0.1 % nasal spray Place into both nostrils 2 (two) times daily. Use in each nostril as directed   Yes [provider]  betamethasone dipropionate (DIPROLENE) 0.05 % ointment Apply topically 2 (two) times daily.   Yes [provider]  Calcium Citrate-Vitamin D 200-250 MG-UNIT TABS Take 1 tablet by mouth 4 (four) times daily.   Yes [provider]  Cholecalciferol (VITAMIN D-3) 125 MCG (5000 UT) TABS Take 2 tablets by mouth daily.   Yes [provider]  docusate sodium (COLACE) 100 MG capsule  Take 1 capsule (100 mg total) by mouth daily as needed for mild constipation. 07/05/19  Yes Norm Parcel, PA-C  Estradiol (ELESTRIN) 0.52 MG/0.87 GM (0.06%) GEL APPLY 2 PUMPS THINLY TO UPPER ARM DAILY AT BEDTIME Patient taking differently: Apply 1 application topically at bedtime. APPLY 2 PUMPS THINLY TO UPPER ARM DAILY AT BEDTIME 04/04/19  Yes [name], MD  famotidine (PEPCID) 20 MG tablet Take 20 mg by mouth at bedtime.    Yes [provider]  Ibrutinib 280 MG TABS Take 280 mg by mouth daily. Take with water at approximately the same time each day 01/06/19  Yes Magrinat, Virgie Dad, MD  lidocaine-prilocaine (EMLA) cream Apply a nickel size amount to skin on top of port- cover with saran wrap at least 1 hour before access 05/07/16  Yes Magrinat, Virgie Dad, MD  Magnesium 200 MG TABS Take 2 tablets by mouth daily.   Yes [provider]  methocarbamol (ROBAXIN) 500 MG tablet Take 2 tablets (1,000 mg total) by mouth every 8 (eight) hours as needed for muscle spasms. 07/05/19  Yes Barkley Boards R, PA-C  montelukast (SINGULAIR) 10 MG tablet Take 10 mg by mouth at bedtime.   Yes [provider]  polyethylene glycol (MIRALAX / GLYCOLAX) 17 g packet Take 17 g by mouth daily as needed for mild constipation. 07/05/19  Yes Norm Parcel, PA-C  progesterone (PROMETRIUM) 200 MG capsule Take 1 capsule (200 mg total) by mouth at bedtime. 04/04/19  Yes [name], MD  traZODone (DESYREL) 100 MG tablet Take 1 tablet (100 mg total) by mouth at bedtime. 12/31/18  Yes [name], MD  venetoclax 100 MG TABS Take 100 mg by mouth daily. Take with food and water at approximately the same time each day. 01/06/19  Yes Magrinat, Virgie Dad, MD  Zinc 30 MG TABS Take 1 tablet by mouth daily.   Yes [provider]     Positive ROS: Otherwise negative  All other systems have been reviewed and were otherwise negative with the exception of those mentioned in the HPI and as  above.  Physical Exam: Constitutional: Alert, well-appearing, no acute distress Ears: External ears without lesions or tenderness. Ear canals are clear bilaterally.  TMs are clear bilaterally with good mobility on pneumatic otoscopy no middle ear effusion noted.  On hearing screening with the 512 1024 tuning fork she hears about the same in both ears with AC > BC bilaterally. Nasal: External nose without lesions. Septum mildly deviated with moderate rhinitis..  After decongesting the nose with Afrin nasal cavity was clear bilaterally both middle meatus regions were clear with no signs of active infection presently. Oral: Lips  and gums without lesions. Tongue and palate mucosa without lesions. Posterior oropharynx clear. Neck: No palpable adenopathy or masses Respiratory: Breathing comfortably  Skin: No facial/neck lesions or rash noted.  Procedures  Assessment: History of recurrent ear infections.  Presently with clear TMs bilaterally and no signs of infection. Chronic rhinitis.  Presently no evidence of acute sinus infection.  Plan: Recommended continue use of her nasal steroid spray and azelastine as this will help reduce sinus problems as well as eustachian tube problems. She will follow up here next time she has a ear infection.  I reviewed her CT scan from 07/02/2019 and this showed clear paranasal sinuses.   Radene Journey, MD   CC:

## 2019-08-09 ENCOUNTER — Ambulatory Visit: Payer: Medicare Other

## 2019-08-09 ENCOUNTER — Other Ambulatory Visit: Payer: Self-pay

## 2019-08-09 DIAGNOSIS — R2689 Other abnormalities of gait and mobility: Secondary | ICD-10-CM

## 2019-08-09 DIAGNOSIS — H8112 Benign paroxysmal vertigo, left ear: Secondary | ICD-10-CM

## 2019-08-09 DIAGNOSIS — M542 Cervicalgia: Secondary | ICD-10-CM

## 2019-08-09 NOTE — Therapy (Signed)
[place], [place], [address] Phone: [phone]   Fax:  [phone]  Physical Therapy Treatment  Patient Details  Name: Rachel [name] MRN: [id number] Date of Birth: 10/28/1950 Referring Provider (PT): Lurline Del MD   Encounter Date: 08/09/2019   PT End of Session - 08/09/19 1007    Visit Number 2    Number of Visits 9    Date for PT Re-Evaluation 10/04/19    PT Start Time 1007    PT Stop Time 1100    PT Time Calculation (min) 53 min    Activity Tolerance Patient tolerated treatment well    Behavior During Therapy [facility] for tasks assessed/performed           Past Medical History:  Diagnosis Date  . 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 Diverticulitis   . Leukemia ([place])   . Lower back pain     Past Surgical History:  Procedure Laterality Date  . BREAST SURGERY  2015   left breast bx  . BUNIONECTOMY     right foot  . lining of uterus removed    . OTHER SURGICAL HISTORY     removal of uterine lining  . TONSILLECTOMY      There were no vitals filed for this visit.   Subjective Assessment - 08/09/19 1008    Subjective Pt states that she was unable to lay propped up on 2 pillows because her back was too painful and she kept waking up laying on her L side. She states that the dizziness has been about the same.    Pertinent History Chronic lymphocytic leukemia    Patient Stated Goals I just want to not have that feeling of dizziness.    Currently in Pain? Yes    Pain Score 5     Pain Location Back    Pain Orientation Lower    Pain Descriptors / Indicators Aching    Pain Type Acute pain    Pain Onset 1 to 4 weeks ago    Pain Frequency Intermittent    Aggravating Factors  once she is up and walking around    Pain Relieving Factors resting                             [facility] Adult PT Treatment/Exercise - 08/09/19 0001      Neuro Re-ed    Neuro Re-ed  Details  Convergence 3x 1 min without any signficiant increase or decrease with repetitions. Saccades 3x 1 min pt reports light headedness without any significant increase or decrease with each repetition      Exercises   Exercises Neck      Neck Exercises: Seated   Neck Retraction 10 reps    Neck Retraction Limitations demonstration with tactile cueing using towel for correct movement and VC    Cervical Rotation Left    Cervical Rotation Limitations 3x 10 seconds flexion/rotation for R sided stretch with demonstration and VC to avoid pain    Lateral Flexion Left    Lateral Flexion Limitations 3x 10 seconds demonstration and return demonstration pt reports no pain on the L and stretching on the R    Other Seated Exercise cervical side bend stretch 3x 10 seconds with demonstration and return demonstration pt reports no increase pain on the L    Other Seated Exercise scapular retraction with demonstration 10x       Manual Therapy  Manual Therapy Soft tissue mobilization    Soft tissue mobilization easy STM to bil trapezius, scalenes, levator scap and splenius cervicis with R tighter/more tender than L; slight improvement by end of session.                   PT Education - 08/09/19 1102    Education Details Access Code: [place], [place] educated on New HEP including cervical exercises and visual activities. Discussed importance of letting symptoms decrease between repetitions of visual activities to decrease risk of exacerbating symptoms.    Person(s) Educated Patient    Methods Explanation;Demonstration;Verbal cues;Handout;Tactile cues    Comprehension Verbalized understanding;Returned demonstration            PT Short Term Goals - 08/02/19 1043      PT SHORT TERM GOAL #1   Title Pt will follow modified directions for the [place] including sleeping on 2 pillows for 2 nights, avoiding extreme head movements and sleeping on the L side for 1 weeks.    Baseline pt was provided with  instructions today.    Time 1    Period Weeks    Status New    Target Date 08/16/19             PT Long Term Goals - 08/02/19 1045      PT LONG TERM GOAL #1   Title Pt will demonstrate no reports of vertigo with L Dix-hallpike maneuver within 4 weeks to demonstrate improved positional vertigo related to BPPV.    Baseline pt reports vertigo with L Dix-Hallpike    Time 4    Period Weeks    Status New    Target Date 09/06/19      PT LONG TERM GOAL #2   Title Pt will be able to perform directional changes of the head in standing without UE support both vertical and horizontal while walking without loss of balance within 8 weeks.    Baseline Currently pt is using a RW and repots light headedness with VOR, convergence and saccades.    Time 8    Period Weeks    Status New    Target Date 10/04/19      PT LONG TERM GOAL #3   Title Pt will report 50% improvement in pain/stiffness at the cervical spine within 8 weeks in order to improve pt cervical ROM to prevent falls and improve functional movement.    Baseline 5/10 pain and stiffness    Time 8    Period Weeks    Status New    Target Date 10/04/19      PT LONG TERM GOAL #4   Title Pt will perform TUG without AD within time frame for decreased risk for falls within 8 weeks in order to demonstrate objective decrease in risk for falls.    Baseline not performed today due to time limitations pt uses RW    Time 8    Period Weeks    Status New    Target Date 10/04/19                 Plan - 08/09/19 1007    Clinical Impression Statement Pt presents to physical therapy with no significant improvement in symptoms post Epley maneuver but states she was unable to follow home instructions due to back brace. Pt continues with increased lightheadedness with convergence and saccades with the latter creating greater symtoms than the former. Pt has no significant increase in symptoms with repetitions and was given  this to work on at  home. Pt is reporting tightness/tenderness at the R cervical spine and was given stretching/sttrengthening exercises for the R cervical spine in order to decrease pain on the R. Palpable tightness/tenderness noted at the bil trapezius, scalenes, levator scap and splenius cervicus with R>L; decreased minimally by end of session with light STM. Pt will benefit from continued POC at this time.    Personal Factors and Comorbidities Comorbidity 1    Comorbidities Pt has fractured her back and is under no bending, lifting or twisting precautions wearing a thoraco-lumbar brace.    Rehab Potential Good    PT Frequency 1x / week    PT Duration 8 weeks    PT Treatment/Interventions Iontophoresis 4mg /ml Dexamethasone;Therapeutic activities;Therapeutic exercise;Neuromuscular re-education;Patient/family education;Manual techniques    PT Next Visit Plan Do TUG, Assess exercises, start VOR activities, continue with visual activities, balance, cervical ROM, STM to tight cervical muscles    PT Home Exercise Plan ccess Code: [place] and Agree with Plan of Care Patient           Patient will benefit from skilled therapeutic intervention in order to improve the following deficits and impairments:  Decreased safety awareness, Pain, Difficulty walking, Decreased balance, Abnormal gait, Dizziness  Visit Diagnosis: Abnormality of gait due to impairment of balance  Cervicalgia  BPPV (benign paroxysmal positional vertigo), left     Problem List Patient Active Problem List   Diagnosis Date Noted  . T9 vertebral fracture ([place]) 07/02/2019  . Aortic atherosclerosis ([place]) 01/07/2018  . Chronic rhinitis 11/03/2017  . Mild intermittent asthma 11/03/2017  . History of food allergy 11/03/2017  . 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 Bruising 05/12/2016  . Thrombocytopenia ([place]) 05/12/2016  . Port catheter in place 10/19/2015  . Dehydration 06/26/2015  . Transaminitis 06/26/2015  .  Nausea with vomiting 06/26/2015  . Diverticulitis 06/25/2015  . Vaginal discharge 01/31/2014  . Low grade squamous intraepithelial lesion (LGSIL) on Papanicolaou smear of cervix 01/31/2014  . Headache disorder 01/05/2014  . CLL (chronic lymphocytic leukemia) ([place]) 07/17/2011    Ander Purpura, PT 08/09/2019, 11:06 AM  [place], [place], [address] Phone: [phone]   Fax:  [phone]  Name: Rachel [name] MRN: [id number] Date of Birth: 1950/11/24

## 2019-08-09 NOTE — Patient Instructions (Signed)
Access Code: [facility] URL: [email or web address] Date: 08/09/2019 Prepared by: Tomma Rakers  Exercises Neck Flexion Stretch - 3-5 x daily - 7 x weekly - 1 sets - 3 reps - 10 seconds hold Cervical AROM Flexion and Rotation - 3-5 x daily - 7 x weekly - 1 sets - 3 reps - 10 seconds only stretch toward the l looking down and bending toward the left do not stretch toward the right hold Seated Cervical Sidebending AROM - 3-5 x daily - 7 x weekly - 1 sets - 3 reps - 10 seconds only stretch toward the left do not follow the picture do not stretch towad the right hold Seated Scapular Retraction - 3-5 x daily - 7 x weekly - 1 sets - 10 reps Seated Cervical Retraction - 3-5 x daily - 7 x weekly - 1 sets - 10 reps Seated Horizontal Saccades - 1 x daily - 7 x weekly - 1 sets - 3 reps - let symptoms completely decrease before going to the next repetition hold Pencil Pushups - 1 x daily - 7 x weekly - 1 sets - 3 reps - let symptoms completely decrease before performing the next repetion of 1 min hold

## 2019-08-12 ENCOUNTER — Other Ambulatory Visit: Payer: Self-pay | Admitting: Oncology

## 2019-08-12 ENCOUNTER — Other Ambulatory Visit: Payer: Self-pay | Admitting: *Deleted

## 2019-08-12 DIAGNOSIS — C9110 Chronic lymphocytic leukemia of B-cell type not having achieved remission: Secondary | ICD-10-CM

## 2019-08-12 MED ORDER — VENCLEXTA 100 MG PO TABS: ORAL_TABLET | ORAL | 6 refills | Status: DC

## 2019-08-17 MED FILL — VENCLEXTA 100 MG TABS: 100 | 30 days supply | Qty: 30 | Fill #0

## 2019-08-23 ENCOUNTER — Other Ambulatory Visit: Payer: Self-pay

## 2019-08-23 ENCOUNTER — Ambulatory Visit: Payer: Medicare Other

## 2019-08-23 DIAGNOSIS — R2689 Other abnormalities of gait and mobility: Secondary | ICD-10-CM | POA: Diagnosis not present

## 2019-08-23 DIAGNOSIS — M542 Cervicalgia: Secondary | ICD-10-CM

## 2019-08-23 DIAGNOSIS — H8112 Benign paroxysmal vertigo, left ear: Secondary | ICD-10-CM

## 2019-08-23 NOTE — Therapy (Signed)
[place], [place], [address] Phone: [phone]   Fax:  [phone]  Physical Therapy Treatment  Patient Details  Name: Rachel [name] MRN: [id number] Date of Birth: September 25, 1950 Referring Provider (PT): Lurline Del MD   Encounter Date: 08/23/2019   PT End of Session - 08/23/19 0951    Visit Number 3    Number of Visits 9    Date for PT Re-Evaluation 10/04/19    PT Start Time 0949    PT Stop Time 1047    PT Time Calculation (min) 58 min    Activity Tolerance Patient tolerated treatment well    Behavior During Therapy [facility] for tasks assessed/performed           Past Medical History:  Diagnosis Date  . 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 Diverticulitis   . Leukemia ([place])   . Lower back pain     Past Surgical History:  Procedure Laterality Date  . BREAST SURGERY  2015   left breast bx  . BUNIONECTOMY     right foot  . lining of uterus removed    . OTHER SURGICAL HISTORY     removal of uterine lining  . TONSILLECTOMY      There were no vitals filed for this visit.   Subjective Assessment - 08/23/19 0952    Subjective Pt states that she continues with dizziness when she gets up quickly or lays down she feels the room spinning. She states that the exercises she has been doing are getting easier but she continues to be working on them.    Pertinent History Chronic lymphocytic leukemia    Patient Stated Goals I just want to not have that feeling of dizziness.    Currently in Pain? Yes    Pain Score 5     Pain Location Back    Pain Orientation Lower    Pain Descriptors / Indicators Aching    Pain Type Acute pain    Pain Onset More than a month ago    Pain Frequency Intermittent    Aggravating Factors  prolonged positions.    Pain Relieving Factors resting                             [facility] Adult PT Treatment/Exercise - 08/23/19 0001      Neuro Re-ed     Neuro Re-ed Details  gaze stabilization with head not and vertical arm movement 1x 1 min sitting then 3x 1 min standing improving with coordination with repetitions, gaze stabilization horizontal with horizontal arm movements 3x 1 min sitting with poor coordination especially when rotating arm to the L and head to the R with holding gaze slight improvement and able to increase speed by last repetition, VOR cancellation vertical in sitting 3x 1 min with difficulty coordination movement and slow movement with activity. Improvement in coordination with gaze stabilization by end of 3 repetitions.       Manual Therapy   Manual Therapy Soft tissue mobilization    Soft tissue mobilization easy STM to bil trapezius, scalenes, levator scap and splenius cervicis, suboccipitals with R tighter/more tender than L; slight improvement by end of session with suboccipital release and easy grad 1-2 traction w/o reports of pain/clunking 1 min hold each                   PT Education - 08/23/19 1028    Education  Details Access Code: [place], updated HEP with VOR cancellation and gaze stabiliation activities in sitting and standing.    Person(s) Educated Patient    Methods Explanation;Demonstration;Verbal cues;Handout    Comprehension Verbalized understanding;Returned demonstration            PT Short Term Goals - 08/02/19 1043      PT SHORT TERM GOAL #1   Title Pt will follow modified directions for the [place] including sleeping on 2 pillows for 2 nights, avoiding extreme head movements and sleeping on the L side for 1 weeks.    Baseline pt was provided with instructions today.    Time 1    Period Weeks    Status New    Target Date 08/16/19             PT Long Term Goals - 08/02/19 1045      PT LONG TERM GOAL #1   Title Pt will demonstrate no reports of vertigo with L Dix-hallpike maneuver within 4 weeks to demonstrate improved positional vertigo related to BPPV.    Baseline pt reports  vertigo with L Dix-Hallpike    Time 4    Period Weeks    Status New    Target Date 09/06/19      PT LONG TERM GOAL #2   Title Pt will be able to perform directional changes of the head in standing without UE support both vertical and horizontal while walking without loss of balance within 8 weeks.    Baseline Currently pt is using a RW and repots light headedness with VOR, convergence and saccades.    Time 8    Period Weeks    Status New    Target Date 10/04/19      PT LONG TERM GOAL #3   Title Pt will report 50% improvement in pain/stiffness at the cervical spine within 8 weeks in order to improve pt cervical ROM to prevent falls and improve functional movement.    Baseline 5/10 pain and stiffness    Time 8    Period Weeks    Status New    Target Date 10/04/19      PT LONG TERM GOAL #4   Title Pt will perform TUG without AD within time frame for decreased risk for falls within 8 weeks in order to demonstrate objective decrease in risk for falls.    Baseline not performed today due to time limitations pt uses RW    Time 8    Period Weeks    Status New    Target Date 10/04/19                 Plan - 08/23/19 0951    Clinical Impression Statement Dix-hallpike re-tested today and horizontal canals with negative finidngs; pt reports she can now sleep on her R side without vertigo. Anterior canal was not checked due to lumbar restrictions; can possibly check in quadruped next session. VOR and gaze stabilization activities were added this session with pt demonstrating slow guarded movements with difficulty maintaining gaze stabilization throughout with initial repetition improving with increased repetitions. She reported 1x vertigo following vertical VOR cancellation on the last repetition when she had increased speed. HEP was updated to reflect activities performed today. Palpable tightness/tenderness continues at the bil traezius, scalenes, levator scapula, splenius cervicis and  suboccipitals with R>L; decreased moderately by end of session with light STm and suboccipital release. Pt will benefit from continued POC at this time.    Personal Factors and  Comorbidities Comorbidity 1    Comorbidities Pt has fractured her back and is under no bending, lifting or twisting precautions wearing a thoraco-lumbar brace.    Rehab Potential Good    PT Frequency 1x / week    PT Duration 8 weeks    PT Treatment/Interventions Iontophoresis 4mg /ml Dexamethasone;Therapeutic activities;Therapeutic exercise;Neuromuscular re-education;Patient/family education;Manual techniques    PT Next Visit Plan Do TUG, Assess exercises, start VOR activities, continue with visual activities, balance, cervical ROM, STM to tight cervical muscles    PT Home Exercise Plan ccess Code: [place] and Agree with Plan of Care Patient           Patient will benefit from skilled therapeutic intervention in order to improve the following deficits and impairments:  Decreased safety awareness, Pain, Difficulty walking, Decreased balance, Abnormal gait, Dizziness  Visit Diagnosis: Abnormality of gait due to impairment of balance  Cervicalgia  BPPV (benign paroxysmal positional vertigo), left     Problem List Patient Active Problem List   Diagnosis Date Noted  . T9 vertebral fracture ([place]) 07/02/2019  . Aortic atherosclerosis ([employer]) 01/07/2018  . Chronic rhinitis 11/03/2017  . Mild intermittent asthma 11/03/2017  . History of food allergy 11/03/2017  . 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 Bruising 05/12/2016  . Thrombocytopenia ([place]) 05/12/2016  . Port catheter in place 10/19/2015  . Dehydration 06/26/2015  . Transaminitis 06/26/2015  . Nausea with vomiting 06/26/2015  . Diverticulitis 06/25/2015  . Vaginal discharge 01/31/2014  . Low grade squamous intraepithelial lesion (LGSIL) on Papanicolaou smear of cervix 01/31/2014  . Headache disorder 01/05/2014  . CLL (chronic  lymphocytic leukemia) ([place]) 07/17/2011    Ander Purpura, PT 08/23/2019, 10:52 AM  [place], [place], [address] Phone: [phone]   Fax:  [phone]  Name: Rachel [name] MRN: [id number] Date of Birth: 08-03-1950

## 2019-08-23 NOTE — Patient Instructions (Signed)
Access Code: [facility] URL: [email or web address] Date: 08/23/2019 Prepared by: Tomma Rakers  Exercises Neck Flexion Stretch - 3-5 x daily - 7 x weekly - 1 sets - 3 reps - 10 seconds hold Cervical AROM Flexion and Rotation - 3-5 x daily - 7 x weekly - 1 sets - 3 reps - 10 seconds only stretch toward the l looking down and bending toward the left do not stretch toward the right hold Seated Cervical Sidebending AROM - 3-5 x daily - 7 x weekly - 1 sets - 3 reps - 10 seconds only stretch toward the left do not follow the picture do not stretch towad the right hold Seated Scapular Retraction - 3-5 x daily - 7 x weekly - 1 sets - 10 reps Seated Cervical Retraction - 3-5 x daily - 7 x weekly - 1 sets - 10 reps Standing Gaze Stabilization with Head Nod and Vertical Arm Movement - 1 x daily - 7 x weekly - 1 sets - 3 reps - 60 seconds hold Standing Gaze Stabilization with Head Rotation and Horizontal Arm Movement - 1 x daily - 7 x weekly - 1 sets - 3 reps - 60 seconds then rest hold Seated Vertical Smooth Pursuit - 1 x daily - 7 x weekly - 3 sets - 10 reps Seated Vertical Smooth Pursuit - 1 x daily - 7 x weekly - 1 sets - 3 reps - 1 minute bring the popsicle stick up over your head following it with your head keeping you gaze fixed on the circle, done bring your head back into pain just as far as you are able without pain then bring it back down repeat for 1 minute then rest and re hold

## 2019-08-30 ENCOUNTER — Other Ambulatory Visit: Payer: Self-pay

## 2019-08-30 ENCOUNTER — Ambulatory Visit: Payer: Medicare Other | Attending: Oncology

## 2019-08-30 DIAGNOSIS — M542 Cervicalgia: Secondary | ICD-10-CM

## 2019-08-30 DIAGNOSIS — H8112 Benign paroxysmal vertigo, left ear: Secondary | ICD-10-CM | POA: Insufficient documentation

## 2019-08-30 DIAGNOSIS — R2689 Other abnormalities of gait and mobility: Secondary | ICD-10-CM | POA: Diagnosis not present

## 2019-08-30 NOTE — Therapy (Signed)
[place], [place], [address] Phone: [phone]   Fax:  [phone]  Physical Therapy Treatment  Patient Details  Name: Rachel [name] MRN: [id number] Date of Birth: 1950-04-14 Referring Provider (PT): Lurline Del MD   Encounter Date: 08/30/2019   PT End of Session - 08/30/19 1107    Visit Number 4    Number of Visits 9    Date for PT Re-Evaluation 10/04/19    PT Start Time 1006    PT Stop Time 1104    PT Time Calculation (min) 58 min    Activity Tolerance Patient tolerated treatment well    Behavior During Therapy [facility] for tasks assessed/performed           Past Medical History:  Diagnosis Date  . 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 Diverticulitis   . Leukemia ([place])   . Lower back pain     Past Surgical History:  Procedure Laterality Date  . BREAST SURGERY  2015   left breast bx  . BUNIONECTOMY     right foot  . lining of uterus removed    . OTHER SURGICAL HISTORY     removal of uterine lining  . TONSILLECTOMY      There were no vitals filed for this visit.   Subjective Assessment - 08/30/19 1014    Subjective I must have slept wrong because when I woke up my back pain was a 12. It's making my dizziness worse today also. I took 2 Tylenol earlier which started to help but I feel like I flared it back up trying to get my bra on. But I did good over the weekend, I was able to walk alot with and without my walker and felt good. So I was really frustrated waking up in so much pain this morning.    Pertinent History Chronic lymphocytic leukemia    Patient Stated Goals I just want to not have that feeling of dizziness.    Currently in Pain? Yes    Pain Score 10-Worst pain ever    Pain Location Back    Pain Orientation Lower    Pain Descriptors / Indicators Sharp;Other (Comment)   just excruciating   Pain Type Acute pain    Pain Onset More than a month ago    Pain  Frequency Constant    Aggravating Factors  I think I slept wrong    Pain Relieving Factors reclining                             [facility] Adult PT Treatment/Exercise - 08/30/19 0001      Self-Care   Self-Care Other Self-Care Comments    Other Self-Care Comments  Spent some of session today encouraging pt to cont daily walking routine as able, but being aware of not overdoing when she is feeling good. She thinks she may have walked too much over weekend and this woulc have played a part in increased pain this moring. Also began discusising how core strengthening will be beneficial once pt is cleared by MD.       Neuro Re-ed    Neuro Re-ed Details  Gaze stabilization with head turns x1 with fingertips on counter of 1 hand, then with alternating arm swings and head turns with gaze stabilization x1 min both in standing and head turns Rt/Lt gazing at post it on wall; seated rest break then standing on Airex in  bil tandem stance with up/down gaze stab 2x30 sec each no HHA  but CGA throughout      Manual Therapy   Manual Therapy Soft tissue mobilization    Soft tissue mobilization In sitting: easy STM to bil trapezius, scalenes, levator scap and splenius cervicis, and suboccipitals                    PT Short Term Goals - 08/02/19 1043      PT SHORT TERM GOAL #1   Title Pt will follow modified directions for the [place] including sleeping on 2 pillows for 2 nights, avoiding extreme head movements and sleeping on the L side for 1 weeks.    Baseline pt was provided with instructions today.    Time 1    Period Weeks    Status New    Target Date 08/16/19             PT Long Term Goals - 08/02/19 1045      PT LONG TERM GOAL #1   Title Pt will demonstrate no reports of vertigo with L Dix-hallpike maneuver within 4 weeks to demonstrate improved positional vertigo related to BPPV.    Baseline pt reports vertigo with L Dix-Hallpike    Time 4    Period Weeks    Status  New    Target Date 09/06/19      PT LONG TERM GOAL #2   Title Pt will be able to perform directional changes of the head in standing without UE support both vertical and horizontal while walking without loss of balance within 8 weeks.    Baseline Currently pt is using a RW and repots light headedness with VOR, convergence and saccades.    Time 8    Period Weeks    Status New    Target Date 10/04/19      PT LONG TERM GOAL #3   Title Pt will report 50% improvement in pain/stiffness at the cervical spine within 8 weeks in order to improve pt cervical ROM to prevent falls and improve functional movement.    Baseline 5/10 pain and stiffness    Time 8    Period Weeks    Status New    Target Date 10/04/19      PT LONG TERM GOAL #4   Title Pt will perform TUG without AD within time frame for decreased risk for falls within 8 weeks in order to demonstrate objective decrease in risk for falls.    Baseline not performed today due to time limitations pt uses RW    Time 8    Period Weeks    Status New    Target Date 10/04/19                 Plan - 08/30/19 1345    Clinical Impression Statement Pt came in today reporting she had woke up with flare up of her LBP that wrapped around to her abdomen (saying this felt like nerve pain) so more rest breaks required. She did tolerate gaze stablization exercises well without increase in dizziness symptoms. Also progressed balance activites to standing on airex with gaze stab activity. Continued with manual therapy to neck and upper thoracic area as pt reports this continues to offer relief from tightness. Did not perform TUG as pt was experiencing increased LBP today. Pt will benefit from further physical therapy to continue address balance deficits and address tight cervical musculature.    Personal Factors and Comorbidities  Comorbidity 1    Comorbidities Pt has fractured her back and is under no bending, lifting or twisting precautions wearing a  thoraco-lumbar brace.    Stability/Clinical Decision Making Stable/Uncomplicated    Rehab Potential Good    PT Frequency 1x / week    PT Duration 8 weeks    PT Treatment/Interventions Iontophoresis 4mg /ml Dexamethasone;Therapeutic activities;Therapeutic exercise;Neuromuscular re-education;Patient/family education;Manual techniques    PT Next Visit Plan Do TUG, Assess exercises, cont VOR activities and assess HEP, continue with visual activities, balance, cervical ROM, STM to tight cervical muscles    PT Home Exercise Plan ccess Code: [place] and Agree with Plan of Care Patient           Patient will benefit from skilled therapeutic intervention in order to improve the following deficits and impairments:  Decreased safety awareness, Pain, Difficulty walking, Decreased balance, Abnormal gait, Dizziness  Visit Diagnosis: Abnormality of gait due to impairment of balance  Cervicalgia  BPPV (benign paroxysmal positional vertigo), left     Problem List Patient Active Problem List   Diagnosis Date Noted  . T9 vertebral fracture ([place]) 07/02/2019  . Aortic atherosclerosis ([place]) 01/07/2018  . Chronic rhinitis 11/03/2017  . Mild intermittent asthma 11/03/2017  . History of food allergy 11/03/2017  . 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 Bruising 05/12/2016  . Thrombocytopenia ([place]) 05/12/2016  . Port catheter in place 10/19/2015  . Dehydration 06/26/2015  . Transaminitis 06/26/2015  . Nausea with vomiting 06/26/2015  . Diverticulitis 06/25/2015  . Vaginal discharge 01/31/2014  . Low grade squamous intraepithelial lesion (LGSIL) on Papanicolaou smear of cervix 01/31/2014  . Headache disorder 01/05/2014  . CLL (chronic lymphocytic leukemia) ([place]) 07/17/2011    Otelia Limes, PTA 08/30/2019, 2:44 PM  [place], [place], [address] Phone: [phone]   Fax:   [phone]  Name: Rachel [name] MRN: [id number] Date of Birth: November 27, 1950

## 2019-08-31 NOTE — Telephone Encounter (Signed)
No entry 

## 2019-09-06 ENCOUNTER — Ambulatory Visit: Payer: Medicare Other | Admitting: Physical Therapy

## 2019-09-15 ENCOUNTER — Ambulatory Visit: Payer: Medicare Other | Admitting: Rehabilitation

## 2019-09-15 ENCOUNTER — Other Ambulatory Visit: Payer: Self-pay

## 2019-09-15 ENCOUNTER — Encounter: Payer: Self-pay | Admitting: Rehabilitation

## 2019-09-15 DIAGNOSIS — R2689 Other abnormalities of gait and mobility: Secondary | ICD-10-CM

## 2019-09-15 DIAGNOSIS — H8112 Benign paroxysmal vertigo, left ear: Secondary | ICD-10-CM | POA: Diagnosis not present

## 2019-09-15 DIAGNOSIS — M542 Cervicalgia: Secondary | ICD-10-CM

## 2019-09-15 NOTE — Therapy (Addendum)
[place], [place], [address] Phone: [phone]   Fax:  [phone]  Physical Therapy Treatment  Patient Details  Name: Rachel [name] MRN: [id number] Date of Birth: 1951/02/12 Referring Provider (PT): Lurline Del MD   Encounter Date: 09/15/2019   PT End of Session - 09/15/19 1714    Visit Number 5    Number of Visits 9    Date for PT Re-Evaluation 10/04/19    PT Start Time 1000    PT Stop Time 1054    PT Time Calculation (min) 54 min    Activity Tolerance Patient tolerated treatment well    Behavior During Therapy [facility] for tasks assessed/performed           Past Medical History:  Diagnosis Date  . 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 Diverticulitis   . Leukemia ([place])   . Lower back pain     Past Surgical History:  Procedure Laterality Date  . BREAST SURGERY  2015   left breast bx  . BUNIONECTOMY     right foot  . lining of uterus removed    . OTHER SURGICAL HISTORY     removal of uterine lining  . TONSILLECTOMY      There were no vitals filed for this visit.   Subjective Assessment - 09/15/19 1005    Subjective I have noticed that if I eat better, more protein and no tequila have helped.  but overall not as much dizziness. I was able to walk without my brace and no walker x 67mn and was okay.  I see the MD tomorrow for xrays.  I have also not had as much pain.    Pertinent History Chronic lymphocytic leukemia. Crush fracture T9 07/02/19 after falling down the stairs.    Limitations Standing;Walking    Currently in Pain? Yes    Pain Score 1     Pain Location Back    Pain Orientation Lower    Pain Type Acute pain    Pain Onset More than a month ago    Pain Frequency Constant              [facility] PT Assessment - 09/15/19 0001      ROM / Strength   AROM / PROM / Strength AROM      AROM   Overall AROM Comments reports overall stiffness from OA even prior to  fall    AROM Assessment Site Cervical    Cervical Flexion 30    Cervical Extension 45    Cervical - Right Side Bend 30    Cervical - Left Side Bend 20    Cervical - Right Rotation 52    Cervical - Left Rotation 42      Special Tests   Other special tests --   smooth pursuit, Head thrust bil, saccades WNL,     High Level Balance   High Level Balance Comments M-CTSIB WNl with no LOB, rhomberg and sharpened rhomberg WNL on foam                         [facility] Adult PT Treatment/Exercise - 09/15/19 0001      Neck Exercises: Seated   Neck Retraction 10 reps    Neck Retraction Limitations demonstration with tactile cueing       Manual Therapy   Manual Therapy Other (comment)    Soft tissue mobilization In sitting: easy STM to bil trapezius,  scalenes, levator scap and splenius cervicis, and suboccipitals    Other Manual Therapy reassessed verstibular function and balance today                    PT Short Term Goals - 08/02/19 1043      PT SHORT TERM GOAL #1   Title Pt will follow modified directions for the [place] including sleeping on 2 pillows for 2 nights, avoiding extreme head movements and sleeping on the L side for 1 weeks.    Baseline pt was provided with instructions today.    Time 1    Period Weeks    Status New    Target Date 08/16/19             PT Long Term Goals - 09/15/19 1719      PT LONG TERM GOAL #1   Title Pt will demonstrate no reports of vertigo with L Dix-hallpike maneuver within 4 weeks to demonstrate improved positional vertigo related to BPPV.    Baseline head thrust negative and negative positional reports.  did not retest DHM due to back    Status Achieved      PT LONG TERM GOAL #2   Title Pt will be able to perform directional changes of the head in standing without UE support both vertical and horizontal while walking without loss of balance within 8 weeks.    Status Achieved      PT LONG TERM GOAL #3   Title Pt will  report 50% improvement in pain/stiffness at the cervical spine within 8 weeks in order to improve pt cervical ROM to prevent falls and improve functional movement.    Baseline 2/10    Status Achieved      PT LONG TERM GOAL #4   Title Pt will perform TUG without AD within time frame for decreased risk for falls within 8 weeks in order to demonstrate objective decrease in risk for falls.    Status Deferred      PT LONG TERM GOAL #5   Title Pt will be ind with final HEP    Time 4    Period Weeks    Status New                 Plan - 09/15/19 1715    Clinical Impression Statement Pt has had no dizziness in 5 days and no symptoms provoked with vestibular assessment in the clinic although limited due to pain with neck motions, decreased neck mobility, and back brace.  No LOB with MCTSIB or other gaze activites on unstable surface, Pt will be seeing the spinal MD tomorrow and would like to return if cleared from brace and restrictions to try and decrease some of the back pain and get back to exercising without limitations or will wait until cleared if not cleared tomorrow.    Rehab Potential Good    PT Frequency 1x / week    PT Duration 8 weeks    PT Treatment/Interventions Iontophoresis 60m/ml Dexamethasone;Therapeutic activities;Therapeutic exercise;Neuromuscular re-education;Patient/family education;Manual techniques    PT Next Visit Plan any changes for spinal precautions?, any dizziness?, may advance treatment to work on LB stiffness or return to general stretch and yoga when cleared    Consulted and Agree with Plan of Care Patient           Patient will benefit from skilled therapeutic intervention in order to improve the following deficits and impairments:  Decreased safety awareness, Pain, Difficulty walking, Decreased  balance, Abnormal gait, Dizziness  Visit Diagnosis: Abnormality of gait due to impairment of balance  Cervicalgia  BPPV (benign paroxysmal positional  vertigo), left     Problem List Patient Active Problem List   Diagnosis Date Noted  . T9 vertebral fracture ([place]) 07/02/2019  . Aortic atherosclerosis ([place]) 01/07/2018  . Chronic rhinitis 11/03/2017  . Mild intermittent asthma 11/03/2017  . History of food allergy 11/03/2017  . 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 Bruising 05/12/2016  . Thrombocytopenia ([place]) 05/12/2016  . Port catheter in place 10/19/2015  . Dehydration 06/26/2015  . Transaminitis 06/26/2015  . Nausea with vomiting 06/26/2015  . Diverticulitis 06/25/2015  . Vaginal discharge 01/31/2014  . Low grade squamous intraepithelial lesion (LGSIL) on Papanicolaou smear of cervix 01/31/2014  . Headache disorder 01/05/2014  . CLL (chronic lymphocytic leukemia) ([place]) 07/17/2011    Stark Bray 09/15/2019, 5:21 PM  [place], [place], [address] Phone: [phone]   Fax:  [phone]  Name: Rachel [name] MRN: [id number] Date of Birth: 10-Mar-1950  PHYSICAL THERAPY DISCHARGE SUMMARY  Visits from Start of Care: 5  Current functional level related to goals / functional outcomes: See above   Remaining deficits: See above   Education / Equipment: See above Plan: Patient agrees to discharge.  Patient goals were partially met. Patient is being discharged due to being pleased with the current functional level.  ?????     Shan Levans, PT

## 2019-09-16 DIAGNOSIS — S22070D Wedge compression fracture of T9-T10 vertebra, subsequent encounter for fracture with routine healing: Secondary | ICD-10-CM | POA: Diagnosis not present

## 2019-09-20 DIAGNOSIS — J309 Allergic rhinitis, unspecified: Secondary | ICD-10-CM | POA: Diagnosis not present

## 2019-09-20 DIAGNOSIS — M810 Age-related osteoporosis without current pathological fracture: Secondary | ICD-10-CM | POA: Diagnosis not present

## 2019-09-20 DIAGNOSIS — J321 Chronic frontal sinusitis: Secondary | ICD-10-CM | POA: Diagnosis not present

## 2019-09-26 MED FILL — VENCLEXTA 100 MG TABS: 100 | 30 days supply | Qty: 30 | Fill #1

## 2019-09-27 ENCOUNTER — Other Ambulatory Visit: Payer: Self-pay | Admitting: Oncology

## 2019-10-07 ENCOUNTER — Other Ambulatory Visit: Payer: Self-pay | Admitting: Internal Medicine

## 2019-10-07 DIAGNOSIS — M810 Age-related osteoporosis without current pathological fracture: Secondary | ICD-10-CM

## 2019-10-11 DIAGNOSIS — Z1231 Encounter for screening mammogram for malignant neoplasm of breast: Secondary | ICD-10-CM | POA: Diagnosis not present

## 2019-10-14 DIAGNOSIS — J45909 Unspecified asthma, uncomplicated: Secondary | ICD-10-CM | POA: Diagnosis not present

## 2019-10-14 DIAGNOSIS — J321 Chronic frontal sinusitis: Secondary | ICD-10-CM | POA: Diagnosis not present

## 2019-10-14 DIAGNOSIS — M810 Age-related osteoporosis without current pathological fracture: Secondary | ICD-10-CM | POA: Diagnosis not present

## 2019-10-14 DIAGNOSIS — R112 Nausea with vomiting, unspecified: Secondary | ICD-10-CM | POA: Diagnosis not present

## 2019-10-14 DIAGNOSIS — Z780 Asymptomatic menopausal state: Secondary | ICD-10-CM | POA: Diagnosis not present

## 2019-10-14 DIAGNOSIS — N183 Chronic kidney disease, stage 3 unspecified: Secondary | ICD-10-CM | POA: Diagnosis not present

## 2019-10-14 DIAGNOSIS — Z0000 Encounter for general adult medical examination without abnormal findings: Secondary | ICD-10-CM | POA: Diagnosis not present

## 2019-10-14 DIAGNOSIS — R2681 Unsteadiness on feet: Secondary | ICD-10-CM | POA: Diagnosis not present

## 2019-10-14 DIAGNOSIS — F3341 Major depressive disorder, recurrent, in partial remission: Secondary | ICD-10-CM | POA: Diagnosis not present

## 2019-10-14 DIAGNOSIS — Z1389 Encounter for screening for other disorder: Secondary | ICD-10-CM | POA: Diagnosis not present

## 2019-10-14 DIAGNOSIS — R748 Abnormal levels of other serum enzymes: Secondary | ICD-10-CM | POA: Diagnosis not present

## 2019-10-14 DIAGNOSIS — C9110 Chronic lymphocytic leukemia of B-cell type not having achieved remission: Secondary | ICD-10-CM | POA: Diagnosis not present

## 2019-10-14 DIAGNOSIS — K219 Gastro-esophageal reflux disease without esophagitis: Secondary | ICD-10-CM | POA: Diagnosis not present

## 2019-10-18 ENCOUNTER — Inpatient Hospital Stay: Payer: Medicare Other | Attending: Oncology | Admitting: Oncology

## 2019-10-18 ENCOUNTER — Other Ambulatory Visit: Payer: Self-pay

## 2019-10-18 ENCOUNTER — Inpatient Hospital Stay: Payer: Medicare Other

## 2019-10-18 VITALS — BP 109/66 | HR 89 | Temp 97.9°F | Resp 18 | Ht 59.0 in | Wt 120.8 lb

## 2019-10-18 DIAGNOSIS — R161 Splenomegaly, not elsewhere classified: Secondary | ICD-10-CM | POA: Insufficient documentation

## 2019-10-18 DIAGNOSIS — C9110 Chronic lymphocytic leukemia of B-cell type not having achieved remission: Secondary | ICD-10-CM | POA: Insufficient documentation

## 2019-10-18 DIAGNOSIS — C8300 Small cell B-cell lymphoma, unspecified site: Secondary | ICD-10-CM

## 2019-10-18 DIAGNOSIS — D649 Anemia, unspecified: Secondary | ICD-10-CM | POA: Diagnosis not present

## 2019-10-18 DIAGNOSIS — Z79899 Other long term (current) drug therapy: Secondary | ICD-10-CM | POA: Diagnosis not present

## 2019-10-18 LAB — COMPREHENSIVE METABOLIC PANEL
ALT: 52 U/L — ABNORMAL HIGH (ref 0–44)
AST: 32 U/L (ref 15–41)
Albumin: 3.7 g/dL (ref 3.5–5.0)
Alkaline Phosphatase: 149 U/L — ABNORMAL HIGH (ref 38–126)
Anion gap: 9 (ref 5–15)
BUN: 11 mg/dL (ref 8–23)
CO2: 25 mmol/L (ref 22–32)
Calcium: 9 mg/dL (ref 8.9–10.3)
Chloride: 106 mmol/L (ref 98–111)
Creatinine, Ser: 0.91 mg/dL (ref 0.44–1.00)
GFR calc Af Amer: >60 mL/min (ref >60)
GFR calc non Af Amer: >60 mL/min (ref >60)
Glucose, Bld: 113 mg/dL — ABNORMAL HIGH (ref 70–99)
Potassium: 3.8 mmol/L (ref 3.5–5.1)
Sodium: 140 mmol/L (ref 135–145)
Total Bilirubin: 0.4 mg/dL (ref 0.3–1.2)
Total Protein: 6.1 g/dL — ABNORMAL LOW (ref 6.5–8.1)

## 2019-10-18 LAB — CBC WITH DIFFERENTIAL/PLATELET
Abs Immature Granulocytes: 0.01 10*3/uL (ref 0.00–0.07)
Basophils Absolute: 0 10*3/uL (ref 0.0–0.1)
Basophils Relative: 1 %
Eosinophils Absolute: 0.1 10*3/uL (ref 0.0–0.5)
Eosinophils Relative: 3 %
HCT: 40 % (ref 36.0–46.0)
Hemoglobin: 12.6 g/dL (ref 12.0–15.0)
Immature Granulocytes: 0 %
Lymphocytes Relative: 22 %
Lymphs Abs: 0.9 10*3/uL (ref 0.7–4.0)
MCH: 25.4 pg — ABNORMAL LOW (ref 26.0–34.0)
MCHC: 31.5 g/dL (ref 30.0–36.0)
MCV: 80.5 fL (ref 80.0–100.0)
Monocytes Absolute: 0.6 10*3/uL (ref 0.1–1.0)
Monocytes Relative: 14 %
Neutro Abs: 2.6 10*3/uL (ref 1.7–7.7)
Neutrophils Relative %: 60 %
Platelets: 141 10*3/uL — ABNORMAL LOW (ref 150–400)
RBC: 4.97 MIL/uL (ref 3.87–5.11)
RDW: 13.9 % (ref 11.5–15.5)
WBC: 4.3 10*3/uL (ref 4.0–10.5)
nRBC: 0 % (ref 0.0–0.2)

## 2019-10-18 LAB — LACTATE DEHYDROGENASE: LDH: 217 U/L — ABNORMAL HIGH (ref 98–192)

## 2019-10-18 LAB — MAGNESIUM: Magnesium: 1.9 mg/dL (ref 1.7–2.4)

## 2019-10-18 MED ORDER — VALACYCLOVIR HCL 1 G PO TABS
ORAL_TABLET | ORAL | 6 refills | Status: DC
Start: 2019-10-18 — End: 2020-12-14

## 2019-10-18 NOTE — Progress Notes (Signed)
[place]  [phone] [phone]     ID: Rachel [name]   DOB: 08-Sep-1950  MR#: [id number]  GMW#:[id number]  Patient Care Team: Leeroy Cha, MD as PCP - General (Internal Medicine) Jaxten Brosh, Virgie Dad, MD as Consulting Physician (Oncology) [name], MD as Consulting Physician (Obstetrics and Gynecology) Vallarie Mare, MD as Consulting Physician (Neurosurgery) OTHER MD:  CHIEF COMPLAINT: Chronic lymphocytic leukemia  CURRENT TREATMENT: venetoclax, ibrutinib   INTERVAL HISTORY: Rachel [name] returns today for follow-up and treatment of her chronic lymphocytic leukemia/small-lymphocytic lymphoma accompanied by her husband Rachel [name].   She continues on venetoclax, 100 mg a day.  She has tolerated this remarkably well at the current dose.  She also continues on ibrutinib, which she says is the only thing that controls her warts.  She has had a little bit of bruising related to this but no other side effects.  Her warts are controlled.  She is scheduled for bone density screening on 01/23/2020.   REVIEW OF SYSTEMS: Rachel [name] has made a very good recovery from her fall and T9 fracture.  She does have some back pain at times but this is not keeping her from walking every day 40 to 45 minutes.  She does have mouth blisters despite being on acyclovir.  These come and go but are present most of the time.  She did get Rachel [name] vaccine in February and is wondering if she should have the booster.  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 She has been treated with Rituxan, ofatumumab, cladribine, bendamustine and currently with ibrutinib, with details summarized below.   PAST MEDICAL HISTORY: Past Medical History:  Diagnosis Date  . Chronic lymphoblastic  leukemia 02/1998  . Diverticulitis   . Leukemia ([place])   . Lower back pain     PAST SURGICAL HISTORY: Past Surgical History:  Procedure Laterality Date  . BREAST SURGERY  2015   left breast bx  . BUNIONECTOMY     right foot  . lining of uterus removed    . OTHER SURGICAL HISTORY     removal of uterine lining  . TONSILLECTOMY      FAMILY HISTORY Family History  Problem Relation Age of Onset  . Depression Son   The patient's mother will turn 41 on March 2021   GYNECOLOGIC HISTORY: Menarche age 54, first live birth age 73, she is [place] P3. She stopped having periods in 1999 after endometrial stripping. She continues on hormone replacement.   SOCIAL HISTORY: Her husband Rachel [name] is retired from [employer]. Her son Rachel [name] lives in [place]; he works for a [employer]. A second son lives in [place] and has two children. Son Rachel [name] lives in [place]. She has 8 grandchildren, the oldest is 69 and the youngest is 44.  ADVANCED DIRECTIVES: In place   HEALTH MAINTENANCE: Social History   Tobacco Use  . Smoking status: Never Smoker  . Smokeless tobacco: Never Used  Vaping Use  . Vaping Use: Never used  Substance Use Topics  . Alcohol use: Yes    Alcohol/week: 0.0 standard drinks    Comment: socially  . Drug use: No     Colonoscopy: 12/2018 (Dr. Collene Mares), repeat in 10 years  PAP: December 2012/ Jodi Mourning  Bone density: Due  Lipid panel: per Dr Drema Dallas  Allergies  Allergen Reactions  .  Penicillins Anaphylaxis    "stopped breathing" "drops my heartbeat" "I just pass out"    Current Outpatient Medications  Medication Sig Dispense Refill  . acetaminophen (TYLENOL) 500 MG tablet Take 500-1,000 mg by mouth every 6 (six) hours as needed for headache.     Marland Kitchen acyclovir (ZOVIRAX) 400 MG tablet TAKE 1 TABLET TWICE A DAY 180 tablet 3  . albuterol (VENTOLIN HFA) 108 (90 Base) MCG/ACT inhaler Inhale 1-2 puffs into the lungs every 6 (six) hours as needed for shortness of breath or  wheezing.    Marland Kitchen allopurinol (ZYLOPRIM) 300 MG tablet TAKE 1 TABLET DAILY (Patient taking differently: Take 300 mg by mouth daily. ) 90 tablet 3  . azelastine (ASTELIN) 0.1 % nasal spray Place into both nostrils 2 (two) times daily. Use in each nostril as directed    . betamethasone dipropionate (DIPROLENE) 0.05 % ointment Apply topically 2 (two) times daily.    . Calcium Citrate-Vitamin D 200-250 MG-UNIT TABS Take 1 tablet by mouth 4 (four) times daily.    . Cholecalciferol (VITAMIN D-3) 125 MCG (5000 UT) TABS Take 2 tablets by mouth daily.    Marland Kitchen docusate sodium (COLACE) 100 MG capsule Take 1 capsule (100 mg total) by mouth daily as needed for mild constipation.    . Estradiol (ELESTRIN) 0.52 MG/0.87 GM (0.06%) GEL APPLY 2 PUMPS THINLY TO UPPER ARM DAILY AT BEDTIME (Patient taking differently: Apply 1 application topically at bedtime. APPLY 2 PUMPS THINLY TO UPPER ARM DAILY AT BEDTIME) 200 g 3  . famotidine (PEPCID) 20 MG tablet Take 20 mg by mouth at bedtime.     . Ibrutinib 280 MG TABS Take 280 mg by mouth daily. Take with water at approximately the same time each day 84 tablet 4  . lidocaine-prilocaine (EMLA) cream Apply a nickel size amount to skin on top of port- cover with saran wrap at least 1 hour before access 30 g 0  . Magnesium 200 MG TABS Take 2 tablets by mouth daily.    . methocarbamol (ROBAXIN) 500 MG tablet Take 2 tablets (1,000 mg total) by mouth every 8 (eight) hours as needed for muscle spasms. 60 tablet 0  . montelukast (SINGULAIR) 10 MG tablet Take 10 mg by mouth at bedtime.    . polyethylene glycol (MIRALAX / GLYCOLAX) 17 g packet Take 17 g by mouth daily as needed for mild constipation.    . progesterone (PROMETRIUM) 200 MG capsule Take 1 capsule (200 mg total) by mouth at bedtime. 90 capsule 3  . traZODone (DESYREL) 100 MG tablet Take 1 tablet (100 mg total) by mouth at bedtime. 90 tablet 3  . venetoclax (VENCLEXTA) 100 MG TABS TAKE 1 TABLET (100 MG) BY MOUTH DAILY. TAKE WITH  FOOD AND WATER AT APPROXIMATELY THE SAME TIME EACH DAY. 30 tablet 6  . Zinc 30 MG TABS Take 1 tablet by mouth daily.     No current facility-administered medications for this visit.    OBJECTIVE: white woman who appears stated age  10:   10/18/19 1444  BP: 109/66  Pulse: 89  Resp: 18  Temp: 97.9 F (36.6 C)  SpO2: 98%   Wt Readings from Last 3 Encounters:  10/18/19 120 lb 12.8 oz (54.8 kg)  07/18/19 115 lb 1.6 oz (52.2 kg)  07/02/19 118 lb (53.5 kg)   Body mass index is 24.4 kg/m.    ECOG FS:1 - Symptomatic but completely ambulatory  Sclerae unicteric, EOMs intact Wearing a mask No cervical or supraclavicular  adenopathy Lungs no rales or rhonchi Heart regular rate and rhythm Abd soft, nontender, positive bowel sounds MSK no focal spinal tenderness, no upper extremity lymphedema Neuro: nonfocal, well oriented, appropriate affect Breasts: Deferred   LAB RESULTS:  Lab Results  Component Value Date   WBC 4.9 07/18/2019   NEUTROABS 3.0 07/18/2019   HGB 12.4 07/18/2019   HCT 38.5 07/18/2019   MCV 89.7 07/18/2019   PLT 176 07/18/2019      Chemistry      Component Value Date/Time   NA 140 07/18/2019 1020   NA 140 12/31/2016 1406   K 4.3 07/18/2019 1020   K 3.6 12/31/2016 1406   CL 103 07/18/2019 1020   CL 101 12/15/2011 1322   CO2 27 07/18/2019 1020   CO2 24 12/31/2016 1406   BUN 13 07/18/2019 1020   BUN 12.8 12/31/2016 1406   CREATININE 0.91 07/18/2019 1020   CREATININE 0.9 12/31/2016 1406      Component Value Date/Time   CALCIUM 9.3 07/18/2019 1020   CALCIUM 8.8 12/31/2016 1406   ALKPHOS 251 (H) 07/18/2019 1020   ALKPHOS 71 12/31/2016 1406   AST 72 (H) 07/18/2019 1020   AST 18 12/31/2016 1406   ALT 100 (H) 07/18/2019 1020   ALT 11 12/31/2016 1406   BILITOT 0.4 07/18/2019 1020   BILITOT 0.49 12/31/2016 1406      STUDIES: No results found.   ASSESSMENT: 69 y.o.  [place] woman with a history of chronic lymphoid  leukemia/well-differentiated lymphocytic lymphoma dating back to January of 2002 treated with Rituxan in 2004 and 2008 and January/February 2012  (1) ofatumumab started 12/10/2011, with a complete remission not obtained after the first 8 weekly treatments; an additional 4 monthly treatments completed 06/08/2012  (2) cladribine x6 completed 04/06/2012  (3) started bendamustine/ rituximab 12/12/2013,  repeated every 28 days for 4-6 cycles, completed 05/04/2014  (a) allopurinol and acyclovir started OCT 2015   (4) anemia: B-12 and folate WNL 02/28/2014; ferritin 48; absolute retic count 41.3, nl MCV  (5) reaction to rituximab vs. rigors 02/27/2014: received IV vancomycin and levaquin briefly, then levaquin po (pcn allergy)  (6) bendamustine/ rituximab for 6 cycles, completed 05/04/2014, with improvement but not normalization of the absolute lymphocyte count  (7) ibrutinib started 05/08/2014, currently at 420mg  daily. Stopped by patient in June, 2019 restarted in September, 2019, discontinued October 2020  (a) hepatitis B studies 03/06/2014 and 12/31/2016, negative  (b) ibrutinib restarted 01/06/2019 (at 280 mg daily) in conjunction with venetoclax  (c) ibrutinib held after 10/18/2019 with resolution of warts  (8) CT abd/pelvis 10/22/2018 shows retroperitoneal adenopathy, increased splenomegaly  (a) repeat CT scan of the neck chest abdomen and pelvis 07/02/2019, with resolution of the previously noted adenopathy and splenomegaly.  (9) started venetoclax 12/15/2018  (a) uneventful ramp-up to the 100 mg a day dose, then held at that dose due to symptoms   PLAN: Wille Glaser is 19-1/2 years out from definitive diagnosis of chronic lymphoid leukemia.  She her disease is quite well controlled on her current treatment.  She is tolerating venetoclax well and we are going to continue that at least through October at which time we could consider possibly going off after restaging.  Her warts have totally  resolved.  This could be the venetoclax rather than the ibrutinib so we are going to hold the ibrutinib.  I am glad she is recovering so well from her recent fall.  She will have a bone density in the next week or 2  and if she is significant osteopenic or osteoporotic we can consider bone building agents for her.  She has mouth sores recurring despite the acyclovir.  I am going to put her on Valtrex and see if we can clear it with that.  She will take 1 tablet twice daily for 5 to 7 days and then daily indefinitely.  I encouraged her to get the [place] booster vaccine as soon as it becomes available to her  Otherwise I will see her again late October before they go back to [place] in November.  We will consider setting her up for repeat PET scan before the end of the year.    Total encounter time 30 minutes.Sarajane Jews C. Javares Kaufhold, MD 10/18/19 3:08 PM Medical Oncology and Hematology [facility] [place], [place] [address] Tel. [phone]    Fax. [phone]   I, Wilburn Mylar, am acting as scribe for Dr. Virgie Dad. Anamaria Dusenbury.  I, Lurline Del MD, have reviewed the above documentation for accuracy and completeness, and I agree with the above.   *Total Encounter Time as defined by the Centers for Medicare and Medicaid Services includes, in addition to the face-to-face time of a patient visit (documented in the note above) non-face-to-face time: obtaining and reviewing outside history, ordering and reviewing medications, tests or procedures, care coordination (communications with other health care professionals or caregivers) and documentation in the medical record.

## 2019-10-19 LAB — BETA 2 MICROGLOBULIN, SERUM: Beta-2 Microglobulin: 3 mg/L — ABNORMAL HIGH (ref 0.6–2.4)

## 2019-10-25 DIAGNOSIS — K219 Gastro-esophageal reflux disease without esophagitis: Secondary | ICD-10-CM | POA: Diagnosis not present

## 2019-10-25 DIAGNOSIS — K5730 Diverticulosis of large intestine without perforation or abscess without bleeding: Secondary | ICD-10-CM | POA: Diagnosis not present

## 2019-10-25 DIAGNOSIS — R682 Dry mouth, unspecified: Secondary | ICD-10-CM | POA: Diagnosis not present

## 2019-10-25 DIAGNOSIS — R1310 Dysphagia, unspecified: Secondary | ICD-10-CM | POA: Diagnosis not present

## 2019-10-25 DIAGNOSIS — R748 Abnormal levels of other serum enzymes: Secondary | ICD-10-CM | POA: Diagnosis not present

## 2019-10-26 MED FILL — VENCLEXTA 100 MG TABS: 100 | 30 days supply | Qty: 30 | Fill #2

## 2019-10-27 LAB — HEPATITIS PANEL, ACUTE
HCV Ab: <0.1 s/co ratio — AB (ref 0.0–0.9)
Hep A IgM: NEGATIVE — AB
Hep B C IgM: NEGATIVE — AB
Hepatitis B Surface Ag: NEGATIVE — AB

## 2019-11-01 ENCOUNTER — Other Ambulatory Visit: Payer: Self-pay | Admitting: Gastroenterology

## 2019-11-01 DIAGNOSIS — R1310 Dysphagia, unspecified: Secondary | ICD-10-CM

## 2019-11-24 DIAGNOSIS — J069 Acute upper respiratory infection, unspecified: Secondary | ICD-10-CM | POA: Diagnosis not present

## 2019-11-24 DIAGNOSIS — J309 Allergic rhinitis, unspecified: Secondary | ICD-10-CM | POA: Diagnosis not present

## 2019-11-28 MED FILL — VENCLEXTA 100 MG TABS: 100 | 30 days supply | Qty: 30 | Fill #3

## 2019-12-06 DIAGNOSIS — Z23 Encounter for immunization: Secondary | ICD-10-CM | POA: Diagnosis not present

## 2019-12-08 ENCOUNTER — Other Ambulatory Visit: Payer: Self-pay | Admitting: Obstetrics

## 2019-12-08 DIAGNOSIS — F5101 Primary insomnia: Secondary | ICD-10-CM

## 2019-12-16 DIAGNOSIS — Z6825 Body mass index (BMI) 25.0-25.9, adult: Secondary | ICD-10-CM | POA: Diagnosis not present

## 2019-12-16 DIAGNOSIS — S22070D Wedge compression fracture of T9-T10 vertebra, subsequent encounter for fracture with routine healing: Secondary | ICD-10-CM | POA: Diagnosis not present

## 2019-12-19 ENCOUNTER — Ambulatory Visit: Payer: Medicare Other | Admitting: Oncology

## 2019-12-19 ENCOUNTER — Other Ambulatory Visit: Payer: Medicare Other

## 2019-12-23 ENCOUNTER — Inpatient Hospital Stay: Payer: Medicare Other | Attending: Oncology

## 2019-12-23 ENCOUNTER — Inpatient Hospital Stay (HOSPITAL_BASED_OUTPATIENT_CLINIC_OR_DEPARTMENT_OTHER): Payer: Medicare Other | Admitting: Oncology

## 2019-12-23 ENCOUNTER — Inpatient Hospital Stay: Payer: Medicare Other

## 2019-12-23 ENCOUNTER — Other Ambulatory Visit: Payer: Self-pay

## 2019-12-23 VITALS — BP 113/54 | HR 64 | Temp 97.5°F | Resp 17 | Ht 59.0 in | Wt 120.8 lb

## 2019-12-23 DIAGNOSIS — C8300 Small cell B-cell lymphoma, unspecified site: Secondary | ICD-10-CM

## 2019-12-23 DIAGNOSIS — Z23 Encounter for immunization: Secondary | ICD-10-CM | POA: Diagnosis not present

## 2019-12-23 DIAGNOSIS — M8580 Other specified disorders of bone density and structure, unspecified site: Secondary | ICD-10-CM | POA: Insufficient documentation

## 2019-12-23 DIAGNOSIS — C9110 Chronic lymphocytic leukemia of B-cell type not having achieved remission: Secondary | ICD-10-CM

## 2019-12-23 DIAGNOSIS — Z452 Encounter for adjustment and management of vascular access device: Secondary | ICD-10-CM | POA: Insufficient documentation

## 2019-12-23 DIAGNOSIS — Z79899 Other long term (current) drug therapy: Secondary | ICD-10-CM | POA: Insufficient documentation

## 2019-12-23 DIAGNOSIS — Z95828 Presence of other vascular implants and grafts: Secondary | ICD-10-CM

## 2019-12-23 LAB — COMPREHENSIVE METABOLIC PANEL
ALT: 22 U/L (ref 0–44)
AST: 24 U/L (ref 15–41)
Albumin: 3.6 g/dL (ref 3.5–5.0)
Alkaline Phosphatase: 85 U/L (ref 38–126)
Anion gap: 8 (ref 5–15)
BUN: 11 mg/dL (ref 8–23)
CO2: 23 mmol/L (ref 22–32)
Calcium: 8.5 mg/dL — ABNORMAL LOW (ref 8.9–10.3)
Chloride: 109 mmol/L (ref 98–111)
Creatinine, Ser: 0.82 mg/dL (ref 0.44–1.00)
GFR, Estimated: >60 mL/min (ref >60)
Glucose, Bld: 84 mg/dL (ref 70–99)
Potassium: 3.7 mmol/L (ref 3.5–5.1)
Sodium: 140 mmol/L (ref 135–145)
Total Bilirubin: 0.5 mg/dL (ref 0.3–1.2)
Total Protein: 5.6 g/dL — ABNORMAL LOW (ref 6.5–8.1)

## 2019-12-23 LAB — CBC WITH DIFFERENTIAL/PLATELET
Abs Immature Granulocytes: 0.02 10*3/uL (ref 0.00–0.07)
Basophils Absolute: 0 10*3/uL (ref 0.0–0.1)
Basophils Relative: 1 %
Eosinophils Absolute: 0.1 10*3/uL (ref 0.0–0.5)
Eosinophils Relative: 3 %
HCT: 40 % (ref 36.0–46.0)
Hemoglobin: 12.7 g/dL (ref 12.0–15.0)
Immature Granulocytes: 1 %
Lymphocytes Relative: 30 %
Lymphs Abs: 1.1 10*3/uL (ref 0.7–4.0)
MCH: 25.6 pg — ABNORMAL LOW (ref 26.0–34.0)
MCHC: 31.8 g/dL (ref 30.0–36.0)
MCV: 80.6 fL (ref 80.0–100.0)
Monocytes Absolute: 0.5 10*3/uL (ref 0.1–1.0)
Monocytes Relative: 15 %
Neutro Abs: 1.8 10*3/uL (ref 1.7–7.7)
Neutrophils Relative %: 50 %
Platelets: 114 10*3/uL — ABNORMAL LOW (ref 150–400)
RBC: 4.96 MIL/uL (ref 3.87–5.11)
RDW: 14.5 % (ref 11.5–15.5)
WBC: 3.6 10*3/uL — ABNORMAL LOW (ref 4.0–10.5)
nRBC: 0 % (ref 0.0–0.2)

## 2019-12-23 LAB — MAGNESIUM: Magnesium: 1.8 mg/dL (ref 1.7–2.4)

## 2019-12-23 LAB — LACTATE DEHYDROGENASE: LDH: 185 U/L (ref 98–192)

## 2019-12-23 MED ORDER — SODIUM CHLORIDE 0.9% FLUSH
10.0000 mL | Freq: Once | INTRAVENOUS | Status: AC
  Administered 2019-12-23: 10 mL
  Filled 2019-12-23: qty 10

## 2019-12-23 MED ORDER — HEPARIN SOD (PORK) LOCK FLUSH 100 UNIT/ML IV SOLN
500.0000 [IU] | Freq: Once | INTRAVENOUS | Status: AC
  Administered 2019-12-23: 500 [IU]
  Filled 2019-12-23: qty 5

## 2019-12-23 MED ORDER — INFLUENZA VAC A&B SA ADJ QUAD 0.5 ML IM PRSY
PREFILLED_SYRINGE | INTRAMUSCULAR | Status: AC
  Filled 2019-12-23: qty 0.5

## 2019-12-23 MED ORDER — INFLUENZA VAC A&B SA ADJ QUAD 0.5 ML IM PRSY
0.5000 mL | PREFILLED_SYRINGE | Freq: Once | INTRAMUSCULAR | Status: AC
  Administered 2019-12-23: 0.5 mL via INTRAMUSCULAR

## 2019-12-23 NOTE — Progress Notes (Signed)
[place]  [phone] [phone]) [phone]     ID: Rachel [name]   DOB: [birth year]  MR#: [id number]  BSW#:[id number]  Patient Care Team: Rachel Cha, MD as PCP - General (Internal Medicine) Rachel [name], Rachel Dad, MD as Consulting Physician (Oncology) Rachel Bombard, MD as Consulting Physician (Obstetrics and Gynecology) Rachel Mare, MD as Consulting Physician (Neurosurgery) OTHER MD:  CHIEF COMPLAINT: Chronic lymphocytic leukemia  CURRENT TREATMENT: venetoclax, ibrutinib   INTERVAL HISTORY: Rachel [name] returns today for follow-up and treatment of her chronic lymphocytic leukemia/small-lymphocytic lymphoma.  She continues on venetoclax, 100 mg a day.  She has tolerates this remarkably well at the current dose.  She has no symptoms associated with it that she is aware of  She also continues on ibrutinib.  She uses this intermittently whenever her warts act up.  She is now back on the drug at 280 mg daily.  She has no side effects from it.  She is scheduled for bone density screening on 04/02/2020 but would like to have that moved up this possible   REVIEW OF SYSTEMS: Rachel [name] has had the Rachel [name] vaccine without complications.  She is planning to go to [place] [name] for more than a month because her mother has been found to have a renal tumor and at 98 probably cannot continue to live independently.  Aside from these issues Rachel [name] is doing quite well and there have been no other family news.  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 She has been treated with Rituxan, ofatumumab, cladribine, bendamustine and currently with ibrutinib, with details summarized below.   PAST MEDICAL HISTORY: Past Medical History:  Diagnosis Date  .  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 Diverticulitis   . Leukemia ([place])   . Lower back pain     PAST SURGICAL HISTORY: Past Surgical History:  Procedure Laterality Date  . BREAST SURGERY  2015   left breast bx  . BUNIONECTOMY     right foot  . lining of uterus removed    . OTHER SURGICAL HISTORY     removal of uterine lining  . TONSILLECTOMY      FAMILY HISTORY Family History  Problem Relation Age of Onset  . Depression Son   The patient's mother will turn 25 on March 2021   GYNECOLOGIC HISTORY: Menarche age 35, first live birth age 32, she is [place] P3. She stopped having periods in 1999 after endometrial stripping. She continues on hormone replacement.   SOCIAL HISTORY: Her husband Rachel [name] is retired from [employer]. Her son Rachel [name] lives in [place]; he works for a [employer]. A second son lives in [place] and has two children. Son Rachel [name] lives in [place]. She has 8 grandchildren, the oldest is 42 and the youngest is 73.  ADVANCED DIRECTIVES: In place   HEALTH MAINTENANCE: Social History   Tobacco Use  . Smoking status: Never Smoker  . Smokeless tobacco: Never Used  Vaping Use  . Vaping Use: Never used  Substance Use Topics  . Alcohol use: Yes    Alcohol/week: 0.0 standard drinks    Comment: socially  . Drug use: No     Colonoscopy: 12/2018 (Dr. Collene Mares), repeat in 10 years  PAP: December 2012/ Rachel [name]  Bone density: Due  Lipid panel: per Dr Rachel [name]  Allergies  Allergen Reactions  . Penicillins Anaphylaxis    "stopped breathing" "drops my heartbeat" "I just pass out"    Current Outpatient Medications  Medication Sig Dispense Refill  . acetaminophen (TYLENOL) 500 MG tablet Take 500-1,000 mg by mouth every 6 (six) hours as needed for headache.     . albuterol (VENTOLIN HFA) 108 (90 Base) MCG/ACT inhaler Inhale 1-2 puffs into the lungs every 6 (six) hours as needed for shortness of breath or wheezing.    Marland Kitchen allopurinol (ZYLOPRIM) 300 MG tablet  TAKE 1 TABLET DAILY (Patient taking differently: Take 300 mg by mouth daily. ) 90 tablet 3  . azelastine (ASTELIN) 0.1 % nasal spray Place into both nostrils 2 (two) times daily. Use in each nostril as directed    . betamethasone dipropionate (DIPROLENE) 0.05 % ointment Apply topically 2 (two) times daily.    . Calcium Citrate-Vitamin D 200-250 MG-UNIT TABS Take 1 tablet by mouth 4 (four) times daily.    . Cholecalciferol (VITAMIN D-3) 125 MCG (5000 UT) TABS Take 2 tablets by mouth daily.    Marland Kitchen docusate sodium (COLACE) 100 MG capsule Take 1 capsule (100 mg total) by mouth daily as needed for mild constipation.    . Estradiol (ELESTRIN) 0.52 MG/0.87 GM (0.06%) GEL APPLY 2 PUMPS THINLY TO UPPER ARM DAILY AT BEDTIME (Patient taking differently: Apply 1 application topically at bedtime. APPLY 2 PUMPS THINLY TO UPPER ARM DAILY AT BEDTIME) 200 g 3  . famotidine (PEPCID) 20 MG tablet Take 20 mg by mouth at bedtime.     . lidocaine-prilocaine (EMLA) cream Apply a nickel size amount to skin on top of port- cover with saran wrap at least 1 hour before access 30 g 0  . Magnesium 200 MG TABS Take 2 tablets by mouth daily.    . methocarbamol (ROBAXIN) 500 MG tablet Take 2 tablets (1,000 mg total) by mouth every 8 (eight) hours as needed for muscle spasms. 60 tablet 0  . montelukast (SINGULAIR) 10 MG tablet Take 10 mg by mouth at bedtime.    . polyethylene glycol (MIRALAX / GLYCOLAX) 17 g packet Take 17 g by mouth daily as needed for mild constipation.    . progesterone (PROMETRIUM) 200 MG capsule Take 1 capsule (200 mg total) by mouth at bedtime. 90 capsule 3  . traZODone (DESYREL) 100 MG tablet TAKE 1 TABLET AT BEDTIME 90 tablet 3  . valACYclovir (VALTREX) 1000 MG tablet Take one tablet by mouth twice daily for 5 days, then one tablet daily 80 tablet 6  . venetoclax (VENCLEXTA) 100 MG TABS TAKE 1 TABLET (100 MG) BY MOUTH DAILY. TAKE WITH FOOD AND WATER AT APPROXIMATELY THE SAME TIME EACH DAY. 30 tablet 6  . Zinc  30 MG TABS Take 1 tablet by mouth daily.     Current Facility-Administered Medications  Medication Dose Route Frequency Provider Last Rate Last Admin  . influenza vaccine adjuvanted (FLUAD) injection 0.5 mL  0.5 mL Intramuscular Once Sandor Arboleda, Rachel Dad, MD        OBJECTIVE: white woman who appears stated age  86:   12/23/19 1034  BP: (!) 113/54  Pulse: 64  Resp: 17  Temp: (!) 97.5 F (36.4 C)  SpO2: 98%   Wt Readings from Last 3 Encounters:  12/23/19 120 lb 12.8 oz (54.8 kg)  10/18/19 120 lb 12.8 oz (54.8 kg)  07/18/19 115 lb 1.6 oz (52.2 kg)   Body mass index is 24.4 kg/m.    ECOG FS:1 - Symptomatic but  completely ambulatory  Sclerae unicteric, EOMs intact Wearing a mask No cervical or supraclavicular adenopathy Lungs no rales or rhonchi Heart regular rate and rhythm Abd soft, nontender, positive bowel sounds MSK no focal spinal tenderness, no upper extremity lymphedema Neuro: nonfocal, well oriented, appropriate affect Breasts: Deferred  LAB RESULTS:  Lab Results  Component Value Date   WBC 3.6 (L) 12/23/2019   NEUTROABS 1.8 12/23/2019   HGB 12.7 12/23/2019   HCT 40.0 12/23/2019   MCV 80.6 12/23/2019   PLT 114 (L) 12/23/2019      Chemistry      Component Value Date/Time   NA 140 10/18/2019 1500   NA 140 12/31/2016 1406   K 3.8 10/18/2019 1500   K 3.6 12/31/2016 1406   CL 106 10/18/2019 1500   CL 101 12/15/2011 1322   CO2 25 10/18/2019 1500   CO2 24 12/31/2016 1406   BUN 11 10/18/2019 1500   BUN 12.8 12/31/2016 1406   CREATININE 0.91 10/18/2019 1500   CREATININE 0.91 07/18/2019 1020   CREATININE 0.9 12/31/2016 1406      Component Value Date/Time   CALCIUM 9.0 10/18/2019 1500   CALCIUM 8.8 12/31/2016 1406   ALKPHOS 149 (H) 10/18/2019 1500   ALKPHOS 71 12/31/2016 1406   AST 32 10/18/2019 1500   AST 72 (H) 07/18/2019 1020   AST 18 12/31/2016 1406   ALT 52 (H) 10/18/2019 1500   ALT 100 (H) 07/18/2019 1020   ALT 11 12/31/2016 1406    BILITOT 0.4 10/18/2019 1500   BILITOT 0.4 07/18/2019 1020   BILITOT 0.49 12/31/2016 1406      STUDIES: No results found.   ASSESSMENT: 69 y.o.  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 rigors 02/27/2014: received IV vancomycin and levaquin briefly, then levaquin po (pcn allergy)  (6) bendamustine/ rituximab for 6 cycles, completed 05/04/2014, with improvement but not normalization of the absolute lymphocyte count  (7) ibrutinib started 05/08/2014, currently at 280 mg daily. Stopped by patient in June, 2019 restarted in September, 2019, discontinued October 2020  (a) hepatitis B studies 03/06/2014 and 12/31/2016, negative  (b) ibrutinib restarted 01/06/2019 (at 280 mg daily) in conjunction with venetoclax  (c) ibrutinib held after 10/18/2019, now used as needed for wart control  (8) CT abd/pelvis 10/22/2018 shows retroperitoneal adenopathy, increased splenomegaly  (a) repeat CT scan of the neck chest abdomen and pelvis 07/02/2019, with resolution of the previously noted adenopathy and splenomegaly.  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  This is very well controlled on venetoclax.  She obtains it of less than $30 a month.  The plan is to continue that indefinitely until there is evidence of disease  progression or unacceptable toxicities or complications  She is using low-dose Imbruvica as needed for the unusual indication of warts.  Of course this also helps as far as her CLL is concerned.  She would like to have her bone density before leaving town mid November.  We will try to facilitate that for her but it may not be possible  If she does have osteopenia she will qualify for denosumab/Prolia and she would be interested in starting that at the next visit here which will be in 3 months  She requested a flu shot today  Total encounter time 25 minutes.Sarajane Jews C. Patrena Santalucia, MD 12/23/19 10:50 AM Medical Oncology and Hematology [facility] [place], [place] [address] Tel. [phone]    Fax. [phone]   I, Wilburn Mylar, am acting as scribe for Dr. Virgie [name]. Jarelle Ates.  I, Lurline Del MD, have reviewed the above documentation for accuracy and completeness, and I agree with the above.   *Total Encounter Time as defined by the Centers for Medicare and Medicaid Services includes, in addition to the face-to-face time of a patient visit (documented in the note above) non-face-to-face time: obtaining and reviewing outside history, ordering and reviewing medications, tests or procedures, care coordination (communications with other health care professionals or caregivers) and documentation in the medical record.

## 2019-12-24 LAB — BETA 2 MICROGLOBULIN, SERUM: Beta-2 Microglobulin: 3.3 mg/L — ABNORMAL HIGH (ref 0.6–2.4)

## 2020-01-03 MED FILL — VENCLEXTA 100 MG TABS: 100 | 30 days supply | Qty: 30 | Fill #4

## 2020-01-23 ENCOUNTER — Other Ambulatory Visit: Payer: Medicare Other

## 2020-01-24 DIAGNOSIS — N760 Acute vaginitis: Secondary | ICD-10-CM | POA: Diagnosis not present

## 2020-02-03 MED FILL — VENCLEXTA 100 MG TABS: 100 | 30 days supply | Qty: 30 | Fill #5

## 2020-02-08 ENCOUNTER — Encounter: Payer: Self-pay | Admitting: Oncology

## 2020-02-20 ENCOUNTER — Other Ambulatory Visit: Payer: Self-pay | Admitting: Oncology

## 2020-02-20 DIAGNOSIS — C9110 Chronic lymphocytic leukemia of B-cell type not having achieved remission: Secondary | ICD-10-CM

## 2020-03-05 ENCOUNTER — Telehealth: Payer: Self-pay | Admitting: *Deleted

## 2020-03-05 ENCOUNTER — Other Ambulatory Visit: Payer: Self-pay | Admitting: *Deleted

## 2020-03-05 MED ORDER — IMBRUVICA 280 MG PO TABS: 1.0000 | ORAL_TABLET | Freq: Every day | ORAL | 1 refills | Status: DC

## 2020-03-05 MED FILL — VENCLEXTA 100 MG TABS: 100 | 30 days supply | Qty: 30 | Fill #6

## 2020-03-05 NOTE — Telephone Encounter (Signed)
Dr Jana Hakim would like her to resume imbruvica. Rx sent to [facility]

## 2020-03-05 NOTE — Telephone Encounter (Signed)
Rachel [name] states she needs a refill for Imbruvica sent to [facility]. She has tried to go off of it, but the warts keep coming back, very painful.

## 2020-03-08 ENCOUNTER — Emergency Department (HOSPITAL_COMMUNITY)
Admission: EM | Admit: 2020-03-08 | Discharge: 2020-03-08 | Disposition: A | Discharge status: Home / Self Care | Payer: Medicare Other | Attending: Emergency Medicine | Admitting: Emergency Medicine

## 2020-03-08 ENCOUNTER — Emergency Department (HOSPITAL_COMMUNITY): Payer: Medicare Other

## 2020-03-08 DIAGNOSIS — Z880 Allergy status to penicillin: Secondary | ICD-10-CM | POA: Diagnosis not present

## 2020-03-08 DIAGNOSIS — I639 Cerebral infarction, unspecified: Secondary | ICD-10-CM | POA: Diagnosis present

## 2020-03-08 DIAGNOSIS — R29810 Facial weakness: Secondary | ICD-10-CM

## 2020-03-08 DIAGNOSIS — J4520 Mild intermittent asthma, uncomplicated: Secondary | ICD-10-CM | POA: Diagnosis not present

## 2020-03-08 DIAGNOSIS — R29818 Other symptoms and signs involving the nervous system: Secondary | ICD-10-CM | POA: Diagnosis not present

## 2020-03-08 DIAGNOSIS — Z79899 Other long term (current) drug therapy: Secondary | ICD-10-CM | POA: Insufficient documentation

## 2020-03-08 DIAGNOSIS — R52 Pain, unspecified: Secondary | ICD-10-CM | POA: Diagnosis not present

## 2020-03-08 DIAGNOSIS — C9111 Chronic lymphocytic leukemia of B-cell type in remission: Secondary | ICD-10-CM | POA: Diagnosis not present

## 2020-03-08 DIAGNOSIS — Z20822 Contact with and (suspected) exposure to covid-19: Secondary | ICD-10-CM | POA: Diagnosis not present

## 2020-03-08 DIAGNOSIS — I10 Essential (primary) hypertension: Secondary | ICD-10-CM | POA: Diagnosis not present

## 2020-03-08 DIAGNOSIS — R531 Weakness: Secondary | ICD-10-CM | POA: Diagnosis not present

## 2020-03-08 DIAGNOSIS — R202 Paresthesia of skin: Secondary | ICD-10-CM | POA: Diagnosis not present

## 2020-03-08 DIAGNOSIS — R29898 Other symptoms and signs involving the musculoskeletal system: Secondary | ICD-10-CM | POA: Diagnosis not present

## 2020-03-08 LAB — COMPREHENSIVE METABOLIC PANEL
ALT: 23 U/L (ref 0–44)
AST: 32 U/L (ref 15–41)
Albumin: 3.5 g/dL (ref 3.5–5.0)
Alkaline Phosphatase: 74 U/L (ref 38–126)
Anion gap: 10 (ref 5–15)
BUN: 14 mg/dL (ref 8–23)
CO2: 22 mmol/L (ref 22–32)
Calcium: 8.9 mg/dL (ref 8.9–10.3)
Chloride: 106 mmol/L (ref 98–111)
Creatinine, Ser: 1.02 mg/dL — ABNORMAL HIGH (ref 0.44–1.00)
GFR, Estimated: 59 mL/min — ABNORMAL LOW (ref >60)
Glucose, Bld: 120 mg/dL — ABNORMAL HIGH (ref 70–99)
Potassium: 3.8 mmol/L (ref 3.5–5.1)
Sodium: 138 mmol/L (ref 135–145)
Total Bilirubin: 0.3 mg/dL (ref 0.3–1.2)
Total Protein: 5.7 g/dL — ABNORMAL LOW (ref 6.5–8.1)

## 2020-03-08 LAB — CBC
HCT: 43.9 % (ref 36.0–46.0)
Hemoglobin: 13.7 g/dL (ref 12.0–15.0)
MCH: 26.7 pg (ref 26.0–34.0)
MCHC: 31.2 g/dL (ref 30.0–36.0)
MCV: 85.4 fL (ref 80.0–100.0)
Platelets: 98 10*3/uL — ABNORMAL LOW (ref 150–400)
RBC: 5.14 MIL/uL — ABNORMAL HIGH (ref 3.87–5.11)
RDW: 13.9 % (ref 11.5–15.5)
WBC: 3.6 10*3/uL — ABNORMAL LOW (ref 4.0–10.5)
nRBC: 0 % (ref 0.0–0.2)

## 2020-03-08 LAB — PROTIME-INR
INR: 1 (ref 0.8–1.2)
Prothrombin Time: 12.4 seconds (ref 11.4–15.2)

## 2020-03-08 LAB — ETHANOL: Alcohol, Ethyl (B): <10 mg/dL (ref <10)

## 2020-03-08 LAB — DIFFERENTIAL
Abs Immature Granulocytes: 0.02 10*3/uL (ref 0.00–0.07)
Basophils Absolute: 0 10*3/uL (ref 0.0–0.1)
Basophils Relative: 1 %
Eosinophils Absolute: 0 10*3/uL (ref 0.0–0.5)
Eosinophils Relative: 1 %
Immature Granulocytes: 1 %
Lymphocytes Relative: 23 %
Lymphs Abs: 0.8 10*3/uL (ref 0.7–4.0)
Monocytes Absolute: 0.4 10*3/uL (ref 0.1–1.0)
Monocytes Relative: 11 %
Neutro Abs: 2.3 10*3/uL (ref 1.7–7.7)
Neutrophils Relative %: 63 %

## 2020-03-08 LAB — I-STAT CHEM 8, ED
BUN: 17 mg/dL (ref 8–23)
Calcium, Ion: 1.13 mmol/L — ABNORMAL LOW (ref 1.15–1.40)
Chloride: 105 mmol/L (ref 98–111)
Creatinine, Ser: 0.9 mg/dL (ref 0.44–1.00)
Glucose, Bld: 117 mg/dL — ABNORMAL HIGH (ref 70–99)
HCT: 41 % (ref 36.0–46.0)
Hemoglobin: 13.9 g/dL (ref 12.0–15.0)
Potassium: 3.8 mmol/L (ref 3.5–5.1)
Sodium: 139 mmol/L (ref 135–145)
TCO2: 25 mmol/L (ref 22–32)

## 2020-03-08 LAB — CBG MONITORING, ED: Glucose-Capillary: 110 mg/dL — ABNORMAL HIGH (ref 70–99)

## 2020-03-08 LAB — RESP PANEL BY RT-PCR (FLU A&B, COVID) ARPGX2
Influenza A by PCR: NEGATIVE
Influenza B by PCR: NEGATIVE
SARS Coronavirus 2 by RT PCR: NEGATIVE

## 2020-03-08 LAB — APTT: aPTT: 24 seconds (ref 24–36)

## 2020-03-08 NOTE — ED Triage Notes (Signed)
To ED via [facility] from dentist office-- was having dental work on left side of face, developed left side facial droop,  Has had left arm tingling on and off for a few days.  Hx of Leukemia- has a port in right side of chest --  18G IV in left AC per EMS

## 2020-03-08 NOTE — Code Documentation (Addendum)
Stroke Response Nurse Documentation Code Stroke Documentation  Rachel [name] is a 70 y.o. F arriving to [facility] ED via [facility] on 03/08/2020 with PMH of Leukemia. Code stroke was activated by EMS. Patient from her dentist's office where she was LKW at 1120 and now complaining of facial droop and L arm weakness. Patient did receive lidocaine for her dental procedure but symptoms did not develop until after procedure was completed. Patient taking No antithrombotic PTA. Stroke team at the bedside on patient arrival, labs drawn and patient cleared for CT by Dr. Dina Rich. Patient taken to CT with team. NIHSS 1, see documentation for details and code stroke times. Patient with left facial droop on exam. The following imaging was completed:  CT, MRI. Patient is not a candidate for tPA due to no stroke on imaging per Dr. Rory Percy, will cancel code stroke per MD. Bedside handoff with ED RN Santiago Glad.    Rachel [name]  Stroke Response RN [phone]

## 2020-03-08 NOTE — ED Provider Notes (Signed)
[facility] EMERGENCY DEPARTMENT Provider Note   CSN: [id number] Arrival date & time: 03/08/20  1238  An emergency department physician performed an initial assessment on this suspected stroke patient at 1239.  History Chief Complaint  Patient presents with  . Code Stroke    Jinx A Hockey is a 70 y.o. female.  HPI      22  Patient medically clear for CT scan.  Neurology at the bedside.  This is a 70 year old female who presents from her dental office as a code stroke.  She has history of CLL now in remission.  She was at her dental office having the procedure.  Lidocaine with epi was administered and shortly thereafter she began to have tingling in her left hand and arm.  Lidocaine was injected on the left side of her face. she was also noted to have a left-sided facial droop.  Because of the left upper extremity symptoms, EMS was called and she was brought in as a code stroke.  She was last seen normal at 11:15 AM.  Level 5 caveat for acuity of condition  Past Medical History:  Diagnosis Date  . 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 Diverticulitis   . Leukemia ([place])   . Lower back pain     Patient Active Problem List   Diagnosis Date Noted  . T9 vertebral fracture ([place]) 07/02/2019  . Aortic atherosclerosis ([place]) 01/07/2018  . Chronic rhinitis 11/03/2017  . Mild intermittent asthma 11/03/2017  . History of food allergy 11/03/2017  . 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 Bruising 05/12/2016  . Thrombocytopenia ([place]) 05/12/2016  . Port catheter in place 10/19/2015  . Dehydration 06/26/2015  . Transaminitis 06/26/2015  . Nausea with vomiting 06/26/2015  . Diverticulitis 06/25/2015  . Vaginal discharge 01/31/2014  . Low grade squamous intraepithelial lesion (LGSIL) on Papanicolaou smear of cervix 01/31/2014  . Headache disorder 01/05/2014  . CLL (chronic lymphocytic leukemia) ([place]) 07/17/2011    Past Surgical History:   Procedure Laterality Date  . BREAST SURGERY  2015   left breast bx  . BUNIONECTOMY     right foot  . lining of uterus removed    . OTHER SURGICAL HISTORY     removal of uterine lining  . TONSILLECTOMY       OB History    Gravida  3   Para      Term      Preterm      AB      Living  3     SAB      IAB      Ectopic      Multiple      Live Births  3           Family History  Problem Relation Age of Onset  . Depression Son     Social History   Tobacco Use  . Smoking status: Never Smoker  . Smokeless tobacco: Never Used  Vaping Use  . Vaping Use: Never used  Substance Use Topics  . Alcohol use: Yes    Alcohol/week: 0.0 standard drinks    Comment: socially  . Drug use: No    Home Medications Prior to Admission medications   Medication Sig Start Date End Date Taking? Authorizing Provider  acetaminophen (TYLENOL) 500 MG tablet Take 500-1,000 mg by mouth every 6 (six) hours as needed for headache.     [provider]  albuterol (VENTOLIN HFA) 108 (90 Base) MCG/ACT inhaler Inhale  1-2 puffs into the lungs every 6 (six) hours as needed for shortness of breath or wheezing. 06/08/19   [provider]  allopurinol (ZYLOPRIM) 300 MG tablet TAKE 1 TABLET DAILY Patient taking differently: Take 300 mg by mouth daily.  06/14/19   Magrinat, Virgie Dad, MD  azelastine (ASTELIN) 0.1 % nasal spray Place into both nostrils 2 (two) times daily. Use in each nostril as directed    [provider]  betamethasone dipropionate (DIPROLENE) 0.05 % ointment Apply topically 2 (two) times daily.    [provider]  Calcium Citrate-Vitamin D 200-250 MG-UNIT TABS Take 1 tablet by mouth 4 (four) times daily.    [provider]  Cholecalciferol (VITAMIN D-3) 125 MCG (5000 UT) TABS Take 2 tablets by mouth daily.    [provider]  docusate sodium (COLACE) 100 MG capsule Take 1 capsule (100 mg total) by mouth daily as needed for mild  constipation. 07/05/19   Norm Parcel, PA-C  Estradiol (ELESTRIN) 0.52 MG/0.87 GM (0.06%) GEL APPLY 2 PUMPS THINLY TO UPPER ARM DAILY AT BEDTIME Patient taking differently: Apply 1 application topically at bedtime. APPLY 2 PUMPS THINLY TO UPPER ARM DAILY AT BEDTIME 04/04/19   [name], MD  famotidine (PEPCID) 20 MG tablet Take 20 mg by mouth at bedtime.     [provider]  ibrutinib (IMBRUVICA) 280 MG TABS Take 1 tablet by mouth daily. 03/05/20   Magrinat, Virgie Dad, MD  lidocaine-prilocaine (EMLA) cream Apply a nickel size amount to skin on top of port- cover with saran wrap at least 1 hour before access 05/07/16   Magrinat, Virgie Dad, MD  Magnesium 200 MG TABS Take 2 tablets by mouth daily.    [provider]  methocarbamol (ROBAXIN) 500 MG tablet Take 2 tablets (1,000 mg total) by mouth every 8 (eight) hours as needed for muscle spasms. 07/05/19   Norm Parcel, PA-C  montelukast (SINGULAIR) 10 MG tablet Take 10 mg by mouth at bedtime.    [provider]  polyethylene glycol (MIRALAX / GLYCOLAX) 17 g packet Take 17 g by mouth daily as needed for mild constipation. 07/05/19   Norm Parcel, PA-C  progesterone (PROMETRIUM) 200 MG capsule Take 1 capsule (200 mg total) by mouth at bedtime. 04/04/19   [name], MD  traZODone (DESYREL) 100 MG tablet TAKE 1 TABLET AT BEDTIME 12/08/19   [name], MD  valACYclovir (VALTREX) 1000 MG tablet Take one tablet by mouth twice daily for 5 days, then one tablet daily 10/18/19   Magrinat, Virgie Dad, MD  venetoclax (VENCLEXTA) 100 MG TABS TAKE 1 TABLET (100 MG) BY MOUTH DAILY. TAKE WITH FOOD AND WATER AT APPROXIMATELY THE SAME TIME EACH DAY. 08/12/19   Magrinat, Virgie Dad, MD  Zinc 30 MG TABS Take 1 tablet by mouth daily.    [provider]    Allergies    Penicillins and Lidocaine-tetracaine-epineph  Review of Systems   Review of Systems  Constitutional: Negative for fever.  Respiratory: Negative for  shortness of breath.   Cardiovascular: Negative for chest pain.  Gastrointestinal: Negative for abdominal pain, nausea and vomiting.  Genitourinary: Negative for dysuria.  Neurological: Positive for facial asymmetry and numbness. Negative for headaches.  All other systems reviewed and are negative.   Physical Exam Updated Vital Signs BP 116/72   Pulse 70   Resp 18   Ht 1.499 m (4\' 11" )   Wt 56.2 kg   SpO2 99%  BMI 25.02 kg/m   Physical Exam Vitals and nursing note reviewed.  Constitutional:      Appearance: She is well-developed and well-nourished.     Comments: ABCs intact  HENT:     Head: Normocephalic and atraumatic.     Mouth/Throat:     Mouth: Mucous membranes are moist.  Eyes:     Pupils: Pupils are equal, round, and reactive to light.  Cardiovascular:     Rate and Rhythm: Normal rate and regular rhythm.     Heart sounds: Normal heart sounds.  Pulmonary:     Effort: Pulmonary effort is normal. No respiratory distress.  Abdominal:     Palpations: Abdomen is soft.     Tenderness: There is no abdominal tenderness.  Musculoskeletal:     Cervical back: Neck supple.     Right lower leg: No edema.     Left lower leg: No edema.  Skin:    General: Skin is warm and dry.  Neurological:     Mental Status: She is alert and oriented to person, place, and time.     Comments: Left facial droop noted with flattening of the left nasal labial fold and difficulty with eye closure, 5 out of 5 strength in all 4 extremities, no drift noted, no dysmetria to finger-nose-finger, further evaluation deferred to neurology.  Psychiatric:        Mood and Affect: Mood and affect and mood normal.     ED Results / Procedures / Treatments   Labs (all labs ordered are listed, but only abnormal results are displayed) Labs Reviewed  CBC - Abnormal; Notable for the following components:      Result Value   WBC 3.6 (*)    RBC 5.14 (*)    Platelets 98 (*)    All other components within  normal limits  COMPREHENSIVE METABOLIC PANEL - Abnormal; Notable for the following components:   Glucose, Bld 120 (*)    Creatinine, Ser 1.02 (*)    Total Protein 5.7 (*)    GFR, Estimated 59 (*)    All other components within normal limits  CBG MONITORING, ED - Abnormal; Notable for the following components:   Glucose-Capillary 110 (*)    All other components within normal limits  I-STAT CHEM 8, ED - Abnormal; Notable for the following components:   Glucose, Bld 117 (*)    Calcium, Ion 1.13 (*)    All other components within normal limits  RESP PANEL BY RT-PCR (FLU A&B, COVID) ARPGX2  ETHANOL  PROTIME-INR  APTT  DIFFERENTIAL  RAPID URINE DRUG SCREEN, HOSP PERFORMED  URINALYSIS, ROUTINE W REFLEX MICROSCOPIC    EKG None  Radiology MR BRAIN WO CONTRAST  Result Date: 03/08/2020 CLINICAL DATA:  Acute neuro deficit. Left facial droop. Dental procedure today. EXAM: MRI HEAD WITHOUT CONTRAST TECHNIQUE: Multiplanar, multiecho pulse sequences of the brain and surrounding structures were obtained without intravenous contrast. COMPARISON:  CT head 03/08/2020.  MRI head 01/04/2014 FINDINGS: Brain: Negative for acute infarct.  Negative for hemorrhage or mass. Multiple small deep white matter hyperintensities bilaterally, with progression since 2015. No cortical infarct. Vascular: Normal arterial flow voids. Skull and upper cervical spine: No focal skeletal lesion. Sinuses/Orbits: Paranasal sinuses clear.  Negative orbit. Other: None IMPRESSION: No acute abnormality Mild to moderate white matter changes, with progression since 2015. Electronically Signed   By: Franchot Gallo M.D.   On: 03/08/2020 13:29   CT HEAD CODE STROKE WO CONTRAST  Result Date: 03/08/2020 CLINICAL DATA:  Code stroke.  Left-sided weakness EXAM: CT HEAD WITHOUT CONTRAST TECHNIQUE: Contiguous axial images were obtained from the base of the skull through the vertex without intravenous contrast. COMPARISON:  07/02/2019 FINDINGS:  Brain: There is no acute intracranial hemorrhage, mass effect, or edema. Gray-white differentiation is preserved. Ventricles and sulci are normal in size and configuration. Minimal patchy hypoattenuation in the supratentorial white matter is nonspecific but could reflect minor chronic microvascular ischemic changes. There is no extra-axial collection. Vascular: No hyperdense vessel. Minimal intracranial atherosclerotic calcification at the skull base. Skull: Unremarkable. Sinuses/Orbits: Aerated.  Orbits are unremarkable. Other: Mastoid air cells are clear. ASPECTS ([place] Stroke Program Early CT Score) - Ganglionic level infarction (caudate, lentiform nuclei, internal capsule, insula, M1-M3 cortex): 7 - Supraganglionic infarction (M4-M6 cortex): 3 Total score (0-10 with 10 being normal): 10 IMPRESSION: There is no acute intracranial hemorrhage or evidence of acute infarction. ASPECT score is 10. These results were communicated to Dr. Rory Percy at 12:56 pm on 03/08/2020 by text page via the [facility] messaging system. Electronically Signed   By: Macy Mis M.D.   On: 03/08/2020 12:57    Procedures Procedures (including critical care time)  Medications Ordered in ED Medications - No data to display  ED Course  I have reviewed the triage vital signs and the nursing notes.  Pertinent labs & imaging results that were available during my care of the patient were reviewed by me and considered in my medical decision making (see chart for details).    MDM Rules/Calculators/A&P                          Patient presents as a code stroke.  She has evidence of left facial droop.  No objective extremity weakness although this was reported as numbness and tingling prior to arrival.  She was cleared for CT.  Neurology got an emergent CT scan as well as MRI that shows no obvious stroke if patient has had progressive resolution of symptoms.  Neurology highly suspicious that symptoms may be related to injection of  lidocaine given that it was on the ipsilateral side of injection and patient has clinically been improving.  On my evaluation, facial droop has completely resolved.  She has symmetric strength in bilateral upper extremities.  Will monitor for short time.  If no progression of symptoms, no further work-up per neurology.  There is a small chance that this could have been a TIA; however, given time course of events and likelihood of facial droop related to injectable medications, would favor this.  Patient was instructed to return immediately if she has any new strokelike symptoms.  She stated understanding  After history, exam, and medical workup I feel the patient has been appropriately medically screened and is safe for discharge home. Pertinent diagnoses were discussed with the patient. Patient was given return precautions.   Final Clinical Impression(s) / ED Diagnoses Final diagnoses:  Facial droop    Rx / DC Orders ED Discharge Orders    None       Merryl Hacker, MD 03/08/20 1401

## 2020-03-08 NOTE — Consult Note (Signed)
Neurology Code Stroke Consult H&P  CC: CODE STROKE Requesting physician: Dr. Dina Rich  History is obtained from: pt and EMS  HPI: Rachel [name] is a 70 y.o. female with a PMHx of CLL now in remission with chemotherapy in the past and now on orals, acid reflux, hypomagnesia, allergic rhinitis, and constipation. No personal or FMHx of stroke.   Pt was at dentist office this a.m. for a procedure. Lidocaine with Epi was administered and shortly thereafter, she complained of left arm and hand weakness with numbness and tingling. She also felt "pressure" in the forehead. Noted left facial droop. Pt felt weak in LUE but none on exam. EMS was called and pt was brought to [facility] ED as a CODE STROKE.   Exam on arrival-pt alert and oriented. Speech clear. Left facial droop. No wrinkles to left forehead with eyebrow raise. Decreased sensation to left lower face. No weakness or drift in extremities. Vision intact. Naming and repetition intact. CBG 110. BP   /  Pt states she has had numbness and tingling in her hand for 3 mos. No weakness until today.   LKW: 7062 tpa given?: No, no stroke on imaging IR Thrombectomy? No, no LVO MRS-1   NIHSS:  1a Level of Conscious.: 0 1b LOC Questions: 0 1c LOC Commands: 0 2 Best Gaze: 0 3 Visual: 0 4 Facial Palsy: 1 5a Motor Arm - left: 0 5b Motor Arm - Right: 0 6a Motor Leg - Left: 0 6b Motor Leg - Right: 0 7 Limb Ataxia: 0 8 Sensory: 1 9 Best Language: 0  10 Dysarthria: 0 11 Extinct. and Inattention: 0 TOTAL: 2   ROS: A complete ROS was no performed due to emergent situation. No n/v, dysphagia, pain in extremities or chest. + frontal pressure. + subjective weakness left UE. Numbness and tingling to left arm x 3 months.     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 She has never used smokeless tobacco. She reports current alcohol use. She reports that she does not use drugs.   Prior to Admission medications   Medication Sig Start Date End Date Taking? Authorizing Provider  acetaminophen (TYLENOL) 500 MG tablet Take 500-1,000 mg by mouth every 6 (six) hours as needed for headache.     [provider]  albuterol (VENTOLIN HFA) 108 (90 Base) MCG/ACT inhaler Inhale 1-2 puffs into the lungs every 6 (six) hours as needed for shortness of breath or wheezing. 06/08/19   [provider]  allopurinol (ZYLOPRIM) 300 MG tablet TAKE 1 TABLET DAILY Patient taking differently: Take 300 mg by mouth daily.  06/14/19   Magrinat, Virgie Dad, MD  azelastine (ASTELIN) 0.1 % nasal spray Place into both nostrils 2 (two) times daily. Use in each nostril as directed    [provider]  betamethasone dipropionate (DIPROLENE) 0.05 % ointment Apply topically 2 (two) times daily.    [provider]  Calcium Citrate-Vitamin D 200-250 MG-UNIT TABS Take 1 tablet by mouth 4 (four) times daily.    [provider]  Cholecalciferol (VITAMIN D-3) 125 MCG (5000 UT) TABS Take 2 tablets by mouth daily.    [provider]  docusate sodium (COLACE) 100 MG capsule Take 1  capsule (100 mg total) by mouth daily as needed for mild constipation. 07/05/19   Norm Parcel, PA-C  Estradiol (ELESTRIN) 0.52 MG/0.87 GM (0.06%) GEL APPLY 2 PUMPS THINLY TO UPPER ARM DAILY AT BEDTIME Patient taking differently: Apply 1 application topically at bedtime. APPLY 2 PUMPS THINLY TO UPPER ARM DAILY AT BEDTIME 04/04/19   [name], MD  famotidine (PEPCID) 20 MG tablet Take 20 mg by mouth at bedtime.     [provider]  ibrutinib (IMBRUVICA) 280 MG TABS Take 1 tablet by mouth daily. 03/05/20   Magrinat, Virgie Dad, MD  lidocaine-prilocaine (EMLA) cream Apply a nickel size amount to skin on top of port- cover with  saran wrap at least 1 hour before access 05/07/16   Magrinat, Virgie Dad, MD  Magnesium 200 MG TABS Take 2 tablets by mouth daily.    [provider]  methocarbamol (ROBAXIN) 500 MG tablet Take 2 tablets (1,000 mg total) by mouth every 8 (eight) hours as needed for muscle spasms. 07/05/19   Norm Parcel, PA-C  montelukast (SINGULAIR) 10 MG tablet Take 10 mg by mouth at bedtime.    [provider]  polyethylene glycol (MIRALAX / GLYCOLAX) 17 g packet Take 17 g by mouth daily as needed for mild constipation. 07/05/19   Norm Parcel, PA-C  progesterone (PROMETRIUM) 200 MG capsule Take 1 capsule (200 mg total) by mouth at bedtime. 04/04/19   [name], MD  traZODone (DESYREL) 100 MG tablet TAKE 1 TABLET AT BEDTIME 12/08/19   [name], MD  valACYclovir (VALTREX) 1000 MG tablet Take one tablet by mouth twice daily for 5 days, then one tablet daily 10/18/19   Magrinat, Virgie Dad, MD  venetoclax (VENCLEXTA) 100 MG TABS TAKE 1 TABLET (100 MG) BY MOUTH DAILY. TAKE WITH FOOD AND WATER AT APPROXIMATELY THE SAME TIME EACH DAY. 08/12/19   Magrinat, Virgie Dad, MD  Zinc 30 MG TABS Take 1 tablet by mouth daily.    [provider]    Exam: Current vital signs: BP 116/72    Pulse 70    Resp 18    Ht 4\' 11"  (1.499 m)    Wt 56.2 kg    SpO2 99%    BMI 25.02 kg/m   Physical Exam  Constitutional: Appears well-developed and well-nourished.  Psych: Affect appropriate to situation Eyes: No scleral injection HENT: No OP obstrucion Head: Normocephalic.  Cardiovascular: RRR Respiratory: Effort normal. GI: Soft.  Skin: WDI  Neuro: Mental Status: Patient is awake, alert, oriented to person, place, month, year, and situation. Patient is able to give a clear and coherent history. No signs of aphasia or neglect. Speech/Language: Speech is intact and fluent. No dysarthria. Comprehension, repetition, and naming intact.  Cranial Nerves: II: Visual Fields are full. Pupils are  equal, round, and reactive to light. III,IV, VI: EOMI without ptosis or diploplia. Strength to OS less than OD.  V: Facial sensation is symmetric to light touch on the forehead and decreased on the left mid and lower face. Able to move jaw back and forth.  VII: left facial droop. Able to puff cheeks and raise eyebrows. No wrinkles to left forehead with eyebrow raise.  VIII: hearing is intact to voice IX, X: Uvula elevates symmetrically. Phonation normal.  XI: Shoulder shrug is symmetric. XII: tongue is midline without atrophy or fasciculations.  Motor: Tone is normal. Bulk is normal. 5/5 strength was present in all four extremities. Sensory: Sensation is symmetric to light  touch in [place]. Extinction intact.  Deep Tendon Reflexes: 2+ and symmetric in the biceps Plantars: Toes are downgoing bilaterally. Cerebellar: FNF are intact bilaterally. No drift UE/LE.  Gait: deferred.   I have reviewed labs in epic and the pertinent results are:  INR   1.0      Dr. Rory Percy reviewed the images obtained:  NCT head showed: There is no acute intracranial hemorrhage or evidence of acute infarction. ASPECT score is 10.  MRI brain showed no acute abnormality. Mild to moderate white matter changes, with progression since 2015.  Assessment: Rachel [name] is a 70 y.o. female PMHx of CLL s/p chemo and now on po meds, in remission. Pt was at dentist office for procedure this am. Pt was in usual state of health prior to Lidocaine with Epi injection at 1115 hrs. Shortly thereafter, she began to c/o left arm weakness and numbness with tingling, although the N/T has been occurring x 3 mos. EMS found pt to c/o frontal head pressure and left hand weakness. EMS saw left facial droop, no aphasia nor dysarthria.   Pt was examined again after MRI. Her sx were completely gone except for slight decreased lower facial sensation, but is better than before. Facial droop is gone. No other focal deficits.    Impression:  1. Stroke like symptoms with normal CT and MRI with completely resolved symptoms except for slight decreased sensation to light touch in lower left face.  Likely facial nerve palsy secondary to anesthetic administration-less likely stroke.  Plan: - MRI brain without contrast done after CTH-negative for acute stroke. -No ASA now as no stroke on imaging.  -no further neurological workup is needed.  -Patient provided with return instructions-asked to call 911 if any of her neurological symptoms were to recur.  Pt seen by Clance Boll, NP and MD. Note/plan to be edited by MD as necessary.  Pager: [phone]  Attending Neurohospitalist Addendum Patient seen and examined with APP/Resident. Agree with the history and physical as documented above.  Briefly, patient was brought in as an acute code stroke from the dentist office after receiving dental treatment for facial droop on the same side where she had received lidocaine injection.  On examination, she had a LMN type left facial palsy.  Given her risk factor with history of cancer, she was taken for a stat MRI after the noncontrast head CT was negative for acute process.  Her brain MRI was negative for any acute stroke.  As she was coming back from the MRI her symptoms are nearly completely resolved and she reported subjective improvement in her symptoms 2. This is likely secondary to side effect from the lidocaine injection for dental treatment and not true stroke or Bell's palsy. Recommendations as above. Plan discussed in detail with the husband, patient and Dr. Dina Rich in the emergency room.  I have independently reviewed the chart, obtained history, review of systems and examined the patient.I have personally reviewed pertinent head/neck/spine imaging (CT/MRI). Please feel free to call with any questions. --- Amie Portland, MD [facility] Pager: [phone]  If 7pm to 7am, please call on call as listed  on AMION.

## 2020-03-08 NOTE — Discharge Instructions (Addendum)
You were seen today for facial droop.  This is likely related to your dental procedure.  However, if you have any new concerning signs or symptoms of stroke, you should call EMS immediately.

## 2020-03-12 ENCOUNTER — Other Ambulatory Visit: Payer: Self-pay | Admitting: Adult Health

## 2020-03-12 DIAGNOSIS — C9110 Chronic lymphocytic leukemia of B-cell type not having achieved remission: Secondary | ICD-10-CM

## 2020-03-12 NOTE — Progress Notes (Signed)
Referral for evusheld placed per Dr. Jana Hakim

## 2020-03-14 DIAGNOSIS — F411 Generalized anxiety disorder: Secondary | ICD-10-CM | POA: Diagnosis not present

## 2020-03-26 NOTE — Progress Notes (Signed)
[place]  [phone] [phone]     ID: Toney Sang Uptain   DOB: [birth year]  MR#: [id number]  [id number]  Patient Care Team: Leeroy Cha, MD as PCP - General (Internal Medicine) Quavis Klutz, Virgie Dad, MD as Consulting Physician (Oncology) [name], MD as Consulting Physician (Obstetrics and Gynecology) Vallarie Mare, MD as Consulting Physician (Neurosurgery) OTHER MD:  CHIEF COMPLAINT: Chronic lymphocytic leukemia  CURRENT TREATMENT: venetoclax, ibrutinib   INTERVAL HISTORY: Rachel [name] returns today for follow-up of her chronic lymphocytic leukemia/small-lymphocytic lymphoma.  She continues on venetoclax, 100 mg a day.  She has tolerates this remarkably well at the current dose.  She has no symptoms associated with it that she is aware of.  She obtains it through our [employer]  She also continues on ibrutinib.  She uses this intermittently whenever her warts act up.  She is now back on the drug at 280 mg daily.  She has no side effects from it other than minimal bruising.  Since her last visit, she underwent bone density screening on 12/26/2019 at [facility] showing a T-score of -1.6, which is considered osteopenic.  She presented to the ED with left-sided weakness and left facial droop following a dental procedure on 03/08/2020. She underwent code stroke workup, which was negative.  Specifically a brain MRI without contrast 03/08/2020 showed no acute abnormality.   REVIEW OF SYSTEMS: Rachel [name] continues to have some family problems and we discussed those today.  She is currently not exercising regularly because of the cold weather she says she is gaining a little bit of weight.  Aside from that she is having no drenching sweats, no fevers, no weight loss (rather she has gained a little weight), no unexplained fatigue, and no adenopathy.   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 She has been treated with Rituxan, ofatumumab, cladribine, bendamustine and currently with ibrutinib, with details summarized below.   PAST MEDICAL HISTORY: Past Medical History:  Diagnosis Date  . 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 Diverticulitis   . Leukemia ([place])   . Lower back pain     PAST SURGICAL HISTORY: Past Surgical History:  Procedure Laterality Date  . BREAST SURGERY  2015   left breast bx  . BUNIONECTOMY     right foot  . lining of uterus removed    . OTHER SURGICAL HISTORY     removal of uterine lining  . TONSILLECTOMY      FAMILY HISTORY Family History  Problem Relation Age of Onset  . Depression Son   The patient's mother will turn 1 on March 2021   GYNECOLOGIC HISTORY: Menarche age 62, first live birth age 27, she is [place] P3. She stopped having periods in 1999 after endometrial stripping. She continues on hormone replacement.   SOCIAL HISTORY: [name] is retired from [employer]. Her son [name] lives in [place]; he works for a [employer]. A second son lives in [place] and has two children. Son [name] lives in [place]. She has 8 grandchildren, the oldest is 40 and the youngest is 51.  ADVANCED DIRECTIVES: In place   HEALTH MAINTENANCE: Social History   Tobacco Use  . Smoking status: Never Smoker  . Smokeless tobacco: Never Used  Vaping Use  . Vaping Use: Never used  Substance Use Topics  .  Alcohol use: Yes    Alcohol/week: 0.0 standard drinks    Comment: socially  . Drug use: No     Colonoscopy: 12/2018 (Dr. Collene Mares), repeat in 10 years  PAP: December 2012/ Jodi Mourning  Bone density: Due  Lipid panel: per Dr Drema Dallas  Allergies  Allergen Reactions  . Penicillins Anaphylaxis    "stopped breathing" "drops my heartbeat" "I just pass out"  .  Lidocaine-Tetracaine-Epineph Other (See Comments)    Pt came in for CODE STROKE after receiving Lidocaine with Epi in dental office. No stroke, but had symptoms.     Current Outpatient Medications  Medication Sig Dispense Refill  . acetaminophen (TYLENOL) 500 MG tablet Take 500-1,000 mg by mouth every 6 (six) hours as needed for headache.     . albuterol (VENTOLIN HFA) 108 (90 Base) MCG/ACT inhaler Inhale 1-2 puffs into the lungs every 6 (six) hours as needed for shortness of breath or wheezing.    Marland Kitchen allopurinol (ZYLOPRIM) 300 MG tablet TAKE 1 TABLET DAILY (Patient taking differently: Take 300 mg by mouth daily. ) 90 tablet 3  . azelastine (ASTELIN) 0.1 % nasal spray Place into both nostrils 2 (two) times daily. Use in each nostril as directed    . betamethasone dipropionate (DIPROLENE) 0.05 % ointment Apply topically 2 (two) times daily.    . Calcium Citrate-Vitamin D 200-250 MG-UNIT TABS Take 1 tablet by mouth 4 (four) times daily.    . Cholecalciferol (VITAMIN D-3) 125 MCG (5000 UT) TABS Take 2 tablets by mouth daily.    Marland Kitchen docusate sodium (COLACE) 100 MG capsule Take 1 capsule (100 mg total) by mouth daily as needed for mild constipation.    . Estradiol (ELESTRIN) 0.52 MG/0.87 GM (0.06%) GEL APPLY 2 PUMPS THINLY TO UPPER ARM DAILY AT BEDTIME (Patient taking differently: Apply 1 application topically at bedtime. APPLY 2 PUMPS THINLY TO UPPER ARM DAILY AT BEDTIME) 200 g 3  . famotidine (PEPCID) 20 MG tablet Take 20 mg by mouth at bedtime.     Marland Kitchen ibrutinib (IMBRUVICA) 280 MG TABS Take 1 tablet by mouth daily. 30 tablet 1  . lidocaine-prilocaine (EMLA) cream Apply a nickel size amount to skin on top of port- cover with saran wrap at least 1 hour before access 30 g 0  . Magnesium 200 MG TABS Take 2 tablets by mouth daily.    . methocarbamol (ROBAXIN) 500 MG tablet Take 2 tablets (1,000 mg total) by mouth every 8 (eight) hours as needed for muscle spasms. 60 tablet 0  . montelukast (SINGULAIR) 10 MG  tablet Take 10 mg by mouth at bedtime.    . polyethylene glycol (MIRALAX / GLYCOLAX) 17 g packet Take 17 g by mouth daily as needed for mild constipation.    . progesterone (PROMETRIUM) 200 MG capsule Take 1 capsule (200 mg total) by mouth at bedtime. 90 capsule 3  . traZODone (DESYREL) 100 MG tablet TAKE 1 TABLET AT BEDTIME 90 tablet 3  . valACYclovir (VALTREX) 1000 MG tablet Take one tablet by mouth twice daily for 5 days, then one tablet daily 80 tablet 6  . venetoclax (VENCLEXTA) 100 MG TABS TAKE 1 TABLET (100 MG) BY MOUTH DAILY. TAKE WITH FOOD AND WATER AT APPROXIMATELY THE SAME TIME EACH DAY. 30 tablet 6  . Zinc 30 MG TABS Take 1 tablet by mouth daily.     No current facility-administered medications for this visit.    OBJECTIVE: white woman in no acute distress  Vitals:   03/27/20 1041  BP: (!) 109/51  Pulse: 79  Resp: 16  Temp: (!) 97.2 F (36.2 C)  SpO2: 100%   Wt Readings from Last 3 Encounters:  03/27/20 123 lb 3.2 oz (55.9 kg)  03/08/20 123 lb 14.4 oz (56.2 kg)  12/23/19 120 lb 12.8 oz (54.8 kg)   Body mass index is 24.88 kg/m.    ECOG FS:1 - Symptomatic but completely ambulatory  Sclerae unicteric, EOMs intact Wearing a mask No cervical or supraclavicular adenopathy, no axillary adenopathy Lungs no rales or rhonchi Heart regular rate and rhythm Abd soft, nontender, positive bowel sounds MSK no focal spinal tenderness, no upper extremity lymphedema Neuro: nonfocal, well oriented, appropriate affect Breasts: Deferred   LAB RESULTS:  Lab Results  Component Value Date   WBC 5.4 03/27/2020   NEUTROABS 2.9 03/27/2020   HGB 13.7 03/27/2020   HCT 42.9 03/27/2020   MCV 85.0 03/27/2020   PLT 123 (L) 03/27/2020      Chemistry      Component Value Date/Time   NA 139 03/08/2020 1252   NA 140 12/31/2016 1406   K 3.8 03/08/2020 1252   K 3.6 12/31/2016 1406   CL 105 03/08/2020 1252   CL 101 12/15/2011 1322   CO2 22 03/08/2020 1248   CO2 24 12/31/2016 1406    BUN 17 03/08/2020 1252   BUN 12.8 12/31/2016 1406   CREATININE 0.90 03/08/2020 1252   CREATININE 0.91 07/18/2019 1020   CREATININE 0.9 12/31/2016 1406      Component Value Date/Time   CALCIUM 8.9 03/08/2020 1248   CALCIUM 8.8 12/31/2016 1406   ALKPHOS 74 03/08/2020 1248   ALKPHOS 71 12/31/2016 1406   AST 32 03/08/2020 1248   AST 72 (H) 07/18/2019 1020   AST 18 12/31/2016 1406   ALT 23 03/08/2020 1248   ALT 100 (H) 07/18/2019 1020   ALT 11 12/31/2016 1406   BILITOT 0.3 03/08/2020 1248   BILITOT 0.4 07/18/2019 1020   BILITOT 0.49 12/31/2016 1406      STUDIES: MR BRAIN WO CONTRAST  Result Date: 03/08/2020 CLINICAL DATA:  Acute neuro deficit. Left facial droop. Dental procedure today. EXAM: MRI HEAD WITHOUT CONTRAST TECHNIQUE: Multiplanar, multiecho pulse sequences of the brain and surrounding structures were obtained without intravenous contrast. COMPARISON:  CT head 03/08/2020.  MRI head 01/04/2014 FINDINGS: Brain: Negative for acute infarct.  Negative for hemorrhage or mass. Multiple small deep white matter hyperintensities bilaterally, with progression since 2015. No cortical infarct. Vascular: Normal arterial flow voids. Skull and upper cervical spine: No focal skeletal lesion. Sinuses/Orbits: Paranasal sinuses clear.  Negative orbit. Other: None IMPRESSION: No acute abnormality Mild to moderate white matter changes, with progression since 2015. Electronically Signed   By: Franchot Gallo M.D.   On: 03/08/2020 13:29   CT HEAD CODE STROKE WO CONTRAST  Result Date: 03/08/2020 CLINICAL DATA:  Code stroke.  Left-sided weakness EXAM: CT HEAD WITHOUT CONTRAST TECHNIQUE: Contiguous axial images were obtained from the base of the skull through the vertex without intravenous contrast. COMPARISON:  07/02/2019 FINDINGS: Brain: There is no acute intracranial hemorrhage, mass effect, or edema. Gray-white differentiation is preserved. Ventricles and sulci are normal in size and configuration.  Minimal patchy hypoattenuation in the supratentorial white matter is nonspecific but could reflect minor chronic microvascular ischemic changes. There is no extra-axial collection. Vascular: No hyperdense vessel. Minimal intracranial atherosclerotic calcification at the skull base. Skull: Unremarkable. Sinuses/Orbits: Aerated.  Orbits are unremarkable. Other: Mastoid air cells are clear. ASPECTS [facility] Stroke Program Early  CT Score) - Ganglionic level infarction (caudate, lentiform nuclei, internal capsule, insula, M1-M3 cortex): 7 - Supraganglionic infarction (M4-M6 cortex): 3 Total score (0-10 with 10 being normal): 10 IMPRESSION: There is no acute intracranial hemorrhage or evidence of acute infarction. ASPECT score is 10. These results were communicated to Dr. Rory Percy at 12:56 pm on 03/08/2020 by text page via the [facility] messaging system. Electronically Signed   By: Macy Mis M.D.   On: 03/08/2020 12:57     ASSESSMENT: 70 y.o.  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 rigors 02/27/2014: received IV vancomycin and levaquin briefly, then levaquin po (pcn allergy)  (6) bendamustine/ rituximab for 6 cycles, completed 05/04/2014, with improvement but not normalization of the absolute lymphocyte count  (7) ibrutinib started 05/08/2014, currently at 280 mg daily.  Stopped by patient in June, 2019 restarted in September, 2019, discontinued October 2020  (a) hepatitis B studies 03/06/2014 and 12/31/2016, negative  (b) ibrutinib restarted 01/06/2019 (at 280 mg daily) in conjunction with venetoclax  (c) ibrutinib held after 10/18/2019, now used as needed for wart control  (8) CT abd/pelvis 10/22/2018 shows retroperitoneal adenopathy, increased splenomegaly  (a) repeat CT scan of the neck chest abdomen and pelvis 07/02/2019, with resolution of the previously noted adenopathy and splenomegaly.  (9) started venetoclax 12/15/2018  (a) uneventful ramp-up to the 100 mg a day dose, then held at that dose due to symptoms  (b) repeat hepatitis panel 10/18/2019 again negative   PLAN: Rachel [name] is now 20 years from initial diagnosis of her chronic lymphoid leukemia.  She has no symptoms related to the disease.  She is tolerating treatment well.  Specifically she has no symptoms that she is aware of from the venetoclax.  She takes the ibrutinib more for her warts than anything else but that also of course contributes and she tolerates it with minimal bruising.  We discussed some family issues of concern  We reviewed her bone density which is currently quite good with a T score of -1.6.  I do not think she needs Prolia.  She is a candidate for Evushed and she is aware that I have added her to the list.  She needs to have her port flushed every 3 months.  I have ordered that.  She will see me in 6 months.  She knows to call for any other issue that may develop before then  Total encounter time 25 minutes.Sarajane Jews C. Richmond Coldren, MD 03/27/20 10:59 AM Medical Oncology and Hematology [facility] [place], [place] [address] Tel. [phone]    Fax. [phone]   I, Wilburn Mylar, am acting as scribe for Dr. Virgie Dad. Joyell Emami.  I, Lurline Del MD, have reviewed the above documentation for accuracy and completeness, and I agree with  the above.   *Total Encounter Time as defined by the Centers for Medicare and Medicaid Services includes, in addition to the face-to-face time of  a patient visit (documented in the note above) non-face-to-face time: obtaining and reviewing outside history, ordering and reviewing medications, tests or procedures, care coordination (communications with other health care professionals or caregivers) and documentation in the medical record.

## 2020-03-27 ENCOUNTER — Other Ambulatory Visit: Payer: Self-pay

## 2020-03-27 ENCOUNTER — Other Ambulatory Visit: Payer: Self-pay | Admitting: Oncology

## 2020-03-27 ENCOUNTER — Inpatient Hospital Stay: Payer: Medicare Other

## 2020-03-27 ENCOUNTER — Inpatient Hospital Stay: Payer: Medicare Other | Attending: Oncology | Admitting: Oncology

## 2020-03-27 VITALS — BP 109/51 | HR 79 | Temp 97.2°F | Resp 16 | Ht 59.0 in | Wt 123.2 lb

## 2020-03-27 DIAGNOSIS — S22070G Wedge compression fracture of T9-T10 vertebra, subsequent encounter for fracture with delayed healing: Secondary | ICD-10-CM

## 2020-03-27 DIAGNOSIS — Z298 Encounter for other specified prophylactic measures: Secondary | ICD-10-CM | POA: Insufficient documentation

## 2020-03-27 DIAGNOSIS — Z9221 Personal history of antineoplastic chemotherapy: Secondary | ICD-10-CM | POA: Insufficient documentation

## 2020-03-27 DIAGNOSIS — D649 Anemia, unspecified: Secondary | ICD-10-CM | POA: Diagnosis not present

## 2020-03-27 DIAGNOSIS — C9110 Chronic lymphocytic leukemia of B-cell type not having achieved remission: Secondary | ICD-10-CM

## 2020-03-27 DIAGNOSIS — C8300 Small cell B-cell lymphoma, unspecified site: Secondary | ICD-10-CM

## 2020-03-27 LAB — CBC WITH DIFFERENTIAL/PLATELET
Abs Immature Granulocytes: 0.02 10*3/uL (ref 0.00–0.07)
Basophils Absolute: 0 10*3/uL (ref 0.0–0.1)
Basophils Relative: 1 %
Eosinophils Absolute: 0.1 10*3/uL (ref 0.0–0.5)
Eosinophils Relative: 2 %
HCT: 42.9 % (ref 36.0–46.0)
Hemoglobin: 13.7 g/dL (ref 12.0–15.0)
Immature Granulocytes: 0 %
Lymphocytes Relative: 29 %
Lymphs Abs: 1.6 10*3/uL (ref 0.7–4.0)
MCH: 27.1 pg (ref 26.0–34.0)
MCHC: 31.9 g/dL (ref 30.0–36.0)
MCV: 85 fL (ref 80.0–100.0)
Monocytes Absolute: 0.7 10*3/uL (ref 0.1–1.0)
Monocytes Relative: 14 %
Neutro Abs: 2.9 10*3/uL (ref 1.7–7.7)
Neutrophils Relative %: 54 %
Platelets: 123 10*3/uL — ABNORMAL LOW (ref 150–400)
RBC: 5.05 MIL/uL (ref 3.87–5.11)
RDW: 14.4 % (ref 11.5–15.5)
WBC: 5.4 10*3/uL (ref 4.0–10.5)
nRBC: 0 % (ref 0.0–0.2)

## 2020-03-27 LAB — MAGNESIUM: Magnesium: 1.8 mg/dL (ref 1.7–2.4)

## 2020-03-27 LAB — COMPREHENSIVE METABOLIC PANEL
ALT: 21 U/L (ref 0–44)
AST: 24 U/L (ref 15–41)
Albumin: 3.7 g/dL (ref 3.5–5.0)
Alkaline Phosphatase: 102 U/L (ref 38–126)
Anion gap: 6 (ref 5–15)
BUN: 15 mg/dL (ref 8–23)
CO2: 27 mmol/L (ref 22–32)
Calcium: 9.1 mg/dL (ref 8.9–10.3)
Chloride: 105 mmol/L (ref 98–111)
Creatinine, Ser: 0.98 mg/dL (ref 0.44–1.00)
GFR, Estimated: >60 mL/min (ref >60)
Glucose, Bld: 75 mg/dL (ref 70–99)
Potassium: 4.1 mmol/L (ref 3.5–5.1)
Sodium: 138 mmol/L (ref 135–145)
Total Bilirubin: 0.6 mg/dL (ref 0.3–1.2)
Total Protein: 6.1 g/dL — ABNORMAL LOW (ref 6.5–8.1)

## 2020-03-27 LAB — LACTATE DEHYDROGENASE: LDH: 172 U/L (ref 98–192)

## 2020-03-27 MED ORDER — IMBRUVICA 280 MG PO TABS: 1.0000 | ORAL_TABLET | Freq: Every day | ORAL | 1 refills | Status: DC

## 2020-03-27 MED ORDER — VENETOCLAX 100 MG PO TABS: ORAL_TABLET | ORAL | 6 refills | Status: DC

## 2020-03-29 ENCOUNTER — Telehealth: Payer: Self-pay | Admitting: Oncology

## 2020-03-29 LAB — BETA 2 MICROGLOBULIN, SERUM: Beta-2 Microglobulin: 3.1 mg/L — ABNORMAL HIGH (ref 0.6–2.4)

## 2020-03-29 NOTE — Telephone Encounter (Signed)
Scheduled appts per 2/1 los. Pt confirmed appt dates and times.  

## 2020-04-02 ENCOUNTER — Other Ambulatory Visit: Payer: Self-pay | Admitting: Adult Health

## 2020-04-02 ENCOUNTER — Encounter: Payer: Self-pay | Admitting: Adult Health

## 2020-04-02 ENCOUNTER — Other Ambulatory Visit: Payer: Medicare Other

## 2020-04-02 DIAGNOSIS — C9110 Chronic lymphocytic leukemia of B-cell type not having achieved remission: Secondary | ICD-10-CM

## 2020-04-02 NOTE — Progress Notes (Signed)
I connected by phone with Rachel [name] on 04/02/2020, 4:06 PM to discuss the potential use of a new treatment for mild to moderate COVID-19 viral infection in non-hospitalized patients.  This patient is a 70 y.o. female that meets the FDA criteria for Emergency Use Authorization of tixagevimab/cilgavimab for pre-exposure prophylaxis of COVID-19 disease. Pt meets following criteria:  Age >12 yr and weight > 40kg  Not currently infected with SARS-CoV-2 and has no known recent exposure to an individual infected with SARS-CoV-2 AND o Who has moderate to severe immune compromise due to a medical condition or receipt of immunosuppressive medications or treatments and may not mount an adequate immune response to COVID-19 vaccination or  o Vaccination with any available COVID-19 vaccine, according to the approved or authorized schedule, is not recommended due to a history of severe adverse reaction (e.g., severe allergic reaction) to a COVID-19 vaccine(s) and/or COVID-19 vaccine component(s).  o Patient meets the following definition of mod-severe immune compromised status: 6. Other actively treated hematologic malignancies or severe congenital immunodeficiency syndromes  I have spoken and communicated the following to the patient or parent/caregiver regarding COVID monoclonal antibody treatment:  1. FDA has authorized the emergency use of tixagevimab/cilgavimab for the pre-exposure prophylaxis of COVID-19 in patients with moderate-severe immunocompromised status, who meet above EUA criteria.  2. The significant known and potential risks and benefits of COVID monoclonal antibody, and the extent to which such potential risks and benefits are unknown.  3. Information on available alternative treatments and the risks and benefits of those alternatives, including clinical trials.  4. The patient or parent/caregiver has the option to accept or refuse COVID monoclonal antibody treatment.  After reviewing  this information with the patient, agree to receive tixagevimab/cilgavimab  Scot Dock, NP, 04/02/2020, 4:06 PM

## 2020-04-06 ENCOUNTER — Other Ambulatory Visit: Payer: Self-pay

## 2020-04-06 ENCOUNTER — Inpatient Hospital Stay: Payer: Medicare Other

## 2020-04-06 DIAGNOSIS — Z9221 Personal history of antineoplastic chemotherapy: Secondary | ICD-10-CM | POA: Diagnosis not present

## 2020-04-06 DIAGNOSIS — C9110 Chronic lymphocytic leukemia of B-cell type not having achieved remission: Secondary | ICD-10-CM | POA: Diagnosis not present

## 2020-04-06 DIAGNOSIS — D649 Anemia, unspecified: Secondary | ICD-10-CM | POA: Diagnosis not present

## 2020-04-06 DIAGNOSIS — Z298 Encounter for other specified prophylactic measures: Secondary | ICD-10-CM | POA: Diagnosis not present

## 2020-04-06 MED ORDER — CILGAVIMAB (PART OF EVUSHELD) INJECTION
150.0000 mg | Freq: Once | INTRAMUSCULAR | Status: AC
  Administered 2020-04-06: 150 mg via INTRAMUSCULAR
  Filled 2020-04-06: qty 1.5

## 2020-04-06 MED ORDER — TIXAGEVIMAB (PART OF EVUSHELD) INJECTION
150.0000 mg | Freq: Once | INTRAMUSCULAR | Status: AC
  Administered 2020-04-06: 150 mg via INTRAMUSCULAR
  Filled 2020-04-06: qty 1.5

## 2020-04-11 MED FILL — VENCLEXTA 100 MG TABS: 100 | 30 days supply | Qty: 30 | Fill #0

## 2020-04-16 ENCOUNTER — Other Ambulatory Visit: Payer: Self-pay

## 2020-04-16 ENCOUNTER — Ambulatory Visit (INDEPENDENT_AMBULATORY_CARE_PROVIDER_SITE_OTHER): Payer: Medicare Other | Admitting: Obstetrics

## 2020-04-16 ENCOUNTER — Encounter: Payer: Self-pay | Admitting: Obstetrics

## 2020-04-16 ENCOUNTER — Other Ambulatory Visit (HOSPITAL_COMMUNITY)
Admission: RE | Admit: 2020-04-16 | Discharge: 2020-04-16 | Disposition: A | Discharge status: Home / Self Care | Payer: Medicare Other | Source: Ambulatory Visit | Attending: Obstetrics | Admitting: Obstetrics

## 2020-04-16 VITALS — BP 104/61 | HR 89 | Ht <= 58 in | Wt 124.0 lb

## 2020-04-16 DIAGNOSIS — Z01419 Encounter for gynecological examination (general) (routine) without abnormal findings: Secondary | ICD-10-CM | POA: Insufficient documentation

## 2020-04-16 DIAGNOSIS — Z1151 Encounter for screening for human papillomavirus (HPV): Secondary | ICD-10-CM | POA: Diagnosis not present

## 2020-04-16 DIAGNOSIS — R87612 Low grade squamous intraepithelial lesion on cytologic smear of cervix (LGSIL): Secondary | ICD-10-CM | POA: Insufficient documentation

## 2020-04-16 DIAGNOSIS — Z124 Encounter for screening for malignant neoplasm of cervix: Secondary | ICD-10-CM | POA: Diagnosis not present

## 2020-04-16 DIAGNOSIS — Z79890 Hormone replacement therapy (postmenopausal): Secondary | ICD-10-CM | POA: Diagnosis not present

## 2020-04-16 MED ORDER — PROGESTERONE 200 MG PO CAPS: 200.0000 mg | ORAL_CAPSULE | Freq: Every day | ORAL | 3 refills | Status: DC

## 2020-04-16 MED ORDER — ELESTRIN 0.52 MG/0.87 GM (0.06%) TD GEL: 1.0000 "application " | Freq: Every day | TRANSDERMAL | 3 refills | Status: DC

## 2020-04-16 NOTE — Progress Notes (Addendum)
GYN presents for AEX/PAP.  Reports no complaints today.  PHQ-9=2

## 2020-04-16 NOTE — Progress Notes (Signed)
Subjective:        Rachel [name] is a 70 y.o. female here for a routine exam.  Current complaints: None.    Personal health questionnaire:  Is patient Ashkenazi Jewish, have a family history of breast and/or ovarian cancer: no Is there a family history of uterine cancer diagnosed at age < 44, gastrointestinal cancer, urinary tract cancer, family member who is a Field seismologist syndrome-associated carrier: no Is the patient overweight and hypertensive, family history of diabetes, personal history of gestational diabetes, preeclampsia or PCOS: no Is patient over 12, have PCOS,  family history of premature CHD under age 52, diabetes, smoke, have hypertension or peripheral artery disease:  no At any time, has a partner hit, kicked or otherwise hurt or frightened you?: no Over the past 2 weeks, have you felt down, depressed or hopeless?: no Over the past 2 weeks, have you felt little interest or pleasure in doing things?:no   Gynecologic History No LMP recorded. Patient is postmenopausal. Contraception: post menopausal status Last Pap: 12-31-2018. Results were: LGSIL with negative HRHPV Last mammogram: 05-18-2017. Results were: normal  Obstetric History OB History  Gravida Para Term Preterm AB Living  3         3  SAB IAB Ectopic Multiple Live Births          3    # Outcome Date GA Lbr Len/2nd Weight Sex Delivery Anes PTL Lv  3 Gravida 09/02/76    M    LIV  2 Gravida 02/12/74    M    LIV  1 Gravida 10/08/72    M   Y     Past Medical History:  Diagnosis Date  . 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 Diverticulitis   . Leukemia ([place])   . Lower back pain     Past Surgical History:  Procedure Laterality Date  . BREAST SURGERY  2015   left breast bx  . BUNIONECTOMY     right foot  . lining of uterus removed    . OTHER SURGICAL HISTORY     removal of uterine lining  . TONSILLECTOMY       Current Outpatient Medications:  .  Estradiol (ELESTRIN) 0.52 MG/0.87 GM (0.06%)  GEL, Apply 1 application topically at bedtime., Disp: 200 g, Rfl: 3 .  progesterone (PROMETRIUM) 200 MG capsule, Take 1 capsule (200 mg total) by mouth at bedtime., Disp: 90 capsule, Rfl: 3 .  acetaminophen (TYLENOL) 500 MG tablet, Take 500-1,000 mg by mouth every 6 (six) hours as needed for headache. , Disp: , Rfl:  .  albuterol (VENTOLIN HFA) 108 (90 Base) MCG/ACT inhaler, Inhale 1-2 puffs into the lungs every 6 (six) hours as needed for shortness of breath or wheezing., Disp: , Rfl:  .  allopurinol (ZYLOPRIM) 300 MG tablet, TAKE 1 TABLET DAILY (Patient taking differently: Take 300 mg by mouth daily. ), Disp: 90 tablet, Rfl: 3 .  azelastine (ASTELIN) 0.1 % nasal spray, Place into both nostrils 2 (two) times daily. Use in each nostril as directed, Disp: , Rfl:  .  betamethasone dipropionate (DIPROLENE) 0.05 % ointment, Apply topically 2 (two) times daily., Disp: , Rfl:  .  Calcium Citrate-Vitamin D 200-250 MG-UNIT TABS, Take 1 tablet by mouth 4 (four) times daily., Disp: , Rfl:  .  Cholecalciferol (VITAMIN D-3) 125 MCG (5000 UT) TABS, Take 2 tablets by mouth daily., Disp: , Rfl:  .  docusate sodium (COLACE) 100 MG capsule, Take 1 capsule (100 mg total)  by mouth daily as needed for mild constipation., Disp: , Rfl:  .  Estradiol (ELESTRIN) 0.52 MG/0.87 GM (0.06%) GEL, APPLY 2 PUMPS THINLY TO UPPER ARM DAILY AT BEDTIME (Patient taking differently: Apply 1 application topically at bedtime. APPLY 2 PUMPS THINLY TO UPPER ARM DAILY AT BEDTIME), Disp: 200 g, Rfl: 3 .  famotidine (PEPCID) 20 MG tablet, Take 20 mg by mouth at bedtime. , Disp: , Rfl:  .  ibrutinib (IMBRUVICA) 280 MG TABS, Take 1 tablet by mouth daily., Disp: 60 tablet, Rfl: 1 .  lidocaine-prilocaine (EMLA) cream, Apply a nickel size amount to skin on top of port- cover with saran wrap at least 1 hour before access, Disp: 30 g, Rfl: 0 .  Magnesium 200 MG TABS, Take 2 tablets by mouth daily., Disp: , Rfl:  .  methocarbamol (ROBAXIN) 500 MG  tablet, Take 2 tablets (1,000 mg total) by mouth every 8 (eight) hours as needed for muscle spasms., Disp: 60 tablet, Rfl: 0 .  montelukast (SINGULAIR) 10 MG tablet, Take 10 mg by mouth at bedtime., Disp: , Rfl:  .  polyethylene glycol (MIRALAX / GLYCOLAX) 17 g packet, Take 17 g by mouth daily as needed for mild constipation., Disp: , Rfl:  .  progesterone (PROMETRIUM) 200 MG capsule, Take 1 capsule (200 mg total) by mouth at bedtime., Disp: 90 capsule, Rfl: 3 .  traZODone (DESYREL) 100 MG tablet, TAKE 1 TABLET AT BEDTIME, Disp: 90 tablet, Rfl: 3 .  valACYclovir (VALTREX) 1000 MG tablet, Take one tablet by mouth twice daily for 5 days, then one tablet daily, Disp: 80 tablet, Rfl: 6 .  venetoclax (VENCLEXTA) 100 MG tablet, TAKE 1 TABLET (100 MG) BY MOUTH DAILY. TAKE WITH FOOD AND WATER AT APPROXIMATELY THE SAME TIME EACH DAY., Disp: 30 tablet, Rfl: 6 .  Zinc 30 MG TABS, Take 1 tablet by mouth daily., Disp: , Rfl:  Allergies  Allergen Reactions  . Penicillins Anaphylaxis    "stopped breathing" "drops my heartbeat" "I just pass out"  . Lidocaine-Tetracaine-Epineph Other (See Comments)    Pt came in for CODE STROKE after receiving Lidocaine with Epi in dental office. No stroke, but had symptoms.     Social History   Tobacco Use  . Smoking status: Never Smoker  . Smokeless tobacco: Never Used  Substance Use Topics  . Alcohol use: Yes    Alcohol/week: 0.0 standard drinks    Comment: socially    Family History  Problem Relation Age of Onset  . Depression Son       Review of Systems  Constitutional: negative for fatigue and weight loss Respiratory: negative for cough and wheezing Cardiovascular: negative for chest pain, fatigue and palpitations Gastrointestinal: negative for abdominal pain and change in bowel habits Musculoskeletal:negative for myalgias Neurological: negative for gait problems and tremors Behavioral/Psych: negative for abusive relationship, depression Endocrine:  negative for temperature intolerance    Genitourinary:negative for abnormal menstrual periods, genital lesions, hot flashes, sexual problems and vaginal discharge Integument/breast: negative for breast lump, breast tenderness, nipple discharge and skin lesion(s)    Objective:       BP 104/61   Pulse 89   Ht 4' 9.5" (1.461 m)   Wt 124 lb (56.2 kg)   BMI 26.37 kg/m  General:   alert and no distress  Skin:   no rash or abnormalities  Lungs:   clear to auscultation bilaterally  Heart:   regular rate and rhythm, S1, S2 normal, no murmur, click, rub or gallop  Breasts:   normal without suspicious masses, skin or nipple changes or axillary nodes  Abdomen:  normal findings: no organomegaly, soft, non-tender and no hernia  Pelvis:  External genitalia: normal general appearance Urinary system: urethral meatus normal and bladder without fullness, nontender Vaginal: normal without tenderness, induration or masses Cervix: normal appearance Adnexa: normal bimanual exam Uterus: anteverted and non-tender, normal size   Lab Review Urine pregnancy test Labs reviewed yes Radiologic studies reviewed yes  50% of 20 min visit spent on counseling and coordination of care.   Assessment:     1. Encounter for gynecological examination with Papanicolaou smear of cervix Rx: - Cytology - PAP( [place])  2. Postmenopausal hormone replacement therapy Rx: - progesterone (PROMETRIUM) 200 MG capsule; Take 1 capsule (200 mg total) by mouth at bedtime.  Dispense: 90 capsule; Refill: 3 - Estradiol (ELESTRIN) 0.52 MG/0.87 GM (0.06%) GEL; Apply 1 application topically at bedtime.  Dispense: 200 g; Refill: 3   Plan:    Education reviewed: calcium supplements, depression evaluation, low fat, low cholesterol diet, safe sex/STD prevention, self breast exams, skin cancer screening and weight bearing exercise. Follow up in: 1 year.   Meds ordered this encounter  Medications  . progesterone (PROMETRIUM) 200  MG capsule    Sig: Take 1 capsule (200 mg total) by mouth at bedtime.    Dispense:  90 capsule    Refill:  3  . Estradiol (ELESTRIN) 0.52 MG/0.87 GM (0.06%) GEL    Sig: Apply 1 application topically at bedtime.    Dispense:  200 g    Refill:  3     [name], MD 04/16/2020 11:11 AM

## 2020-04-19 DIAGNOSIS — F411 Generalized anxiety disorder: Secondary | ICD-10-CM | POA: Diagnosis not present

## 2020-04-23 ENCOUNTER — Telehealth: Payer: Self-pay | Admitting: Adult Health

## 2020-04-23 ENCOUNTER — Other Ambulatory Visit: Payer: Self-pay | Admitting: Adult Health

## 2020-04-23 DIAGNOSIS — C9110 Chronic lymphocytic leukemia of B-cell type not having achieved remission: Secondary | ICD-10-CM

## 2020-04-23 NOTE — Progress Notes (Signed)
See phone note

## 2020-04-23 NOTE — Telephone Encounter (Signed)
I reviewed with Rachel [name] that we received updated FDA guidance that all patients who previously received Tixagevimab150mg /Cilgavimab150mg  should receive another 150mg  dose to equal the new updated dose of 300mg Tixagevimab/300mg  Cilgavimab.  Patient verbalized understanding and agreed to updated dose.  They will receive this dose 05/01/2020 at 1530.  Orders placed.  Scheduling message sent  Wilber Bihari, NP

## 2020-04-24 ENCOUNTER — Telehealth: Payer: Self-pay

## 2020-04-24 NOTE — Telephone Encounter (Signed)
Patient notified of results and need for COLPO.  she stated that she needs to talk with Dr. Jodi Mourning because she is very worried.

## 2020-04-24 NOTE — Telephone Encounter (Signed)
-----   Message from [name], MD sent at 04/24/2020 11:06 AM EST ----- LGSIL pap smear.  Schedule colposcopy.

## 2020-04-25 DIAGNOSIS — K219 Gastro-esophageal reflux disease without esophagitis: Secondary | ICD-10-CM | POA: Diagnosis not present

## 2020-04-25 DIAGNOSIS — R748 Abnormal levels of other serum enzymes: Secondary | ICD-10-CM | POA: Diagnosis not present

## 2020-04-25 DIAGNOSIS — F3341 Major depressive disorder, recurrent, in partial remission: Secondary | ICD-10-CM | POA: Diagnosis not present

## 2020-04-25 DIAGNOSIS — C9110 Chronic lymphocytic leukemia of B-cell type not having achieved remission: Secondary | ICD-10-CM | POA: Diagnosis not present

## 2020-04-25 DIAGNOSIS — H66006 Acute suppurative otitis media without spontaneous rupture of ear drum, recurrent, bilateral: Secondary | ICD-10-CM | POA: Diagnosis not present

## 2020-04-25 DIAGNOSIS — N183 Chronic kidney disease, stage 3 unspecified: Secondary | ICD-10-CM | POA: Diagnosis not present

## 2020-04-25 LAB — CYTOLOGY - PAP
Adequacy: ABSENT
Comment: NEGATIVE
Comment: NEGATIVE
HPV 16: NEGATIVE
HPV 18 / 45: NEGATIVE
High risk HPV: POSITIVE — AB

## 2020-05-01 ENCOUNTER — Inpatient Hospital Stay: Payer: Medicare Other | Attending: Oncology

## 2020-05-01 ENCOUNTER — Other Ambulatory Visit: Payer: Self-pay

## 2020-05-01 DIAGNOSIS — Z298 Encounter for other specified prophylactic measures: Secondary | ICD-10-CM | POA: Insufficient documentation

## 2020-05-01 DIAGNOSIS — C9110 Chronic lymphocytic leukemia of B-cell type not having achieved remission: Secondary | ICD-10-CM | POA: Insufficient documentation

## 2020-05-01 MED ORDER — CILGAVIMAB (PART OF EVUSHELD) INJECTION
150.0000 mg | Freq: Once | INTRAMUSCULAR | Status: AC
  Administered 2020-05-01: 150 mg via INTRAMUSCULAR
  Filled 2020-05-01: qty 1.5

## 2020-05-01 MED ORDER — TIXAGEVIMAB (PART OF EVUSHELD) INJECTION
150.0000 mg | Freq: Once | INTRAMUSCULAR | Status: AC
  Administered 2020-05-01: 150 mg via INTRAMUSCULAR
  Filled 2020-05-01: qty 1.5

## 2020-05-09 DIAGNOSIS — F411 Generalized anxiety disorder: Secondary | ICD-10-CM | POA: Diagnosis not present

## 2020-05-14 ENCOUNTER — Other Ambulatory Visit (HOSPITAL_COMMUNITY)
Admission: RE | Admit: 2020-05-14 | Discharge: 2020-05-14 | Disposition: A | Discharge status: Home / Self Care | Payer: Medicare Other | Source: Ambulatory Visit | Attending: Obstetrics | Admitting: Obstetrics

## 2020-05-14 ENCOUNTER — Other Ambulatory Visit: Payer: Self-pay

## 2020-05-14 ENCOUNTER — Ambulatory Visit (INDEPENDENT_AMBULATORY_CARE_PROVIDER_SITE_OTHER): Payer: Medicare Other | Admitting: Obstetrics

## 2020-05-14 ENCOUNTER — Encounter: Payer: Self-pay | Admitting: Obstetrics

## 2020-05-14 VITALS — Ht <= 58 in | Wt 125.4 lb

## 2020-05-14 DIAGNOSIS — R87612 Low grade squamous intraepithelial lesion on cytologic smear of cervix (LGSIL): Secondary | ICD-10-CM | POA: Insufficient documentation

## 2020-05-14 DIAGNOSIS — Z780 Asymptomatic menopausal state: Secondary | ICD-10-CM

## 2020-05-14 DIAGNOSIS — N870 Mild cervical dysplasia: Secondary | ICD-10-CM | POA: Diagnosis not present

## 2020-05-14 DIAGNOSIS — R87810 Cervical high risk human papillomavirus (HPV) DNA test positive: Secondary | ICD-10-CM

## 2020-05-14 MED FILL — VENCLEXTA 100 MG TABS: 100 | 30 days supply | Qty: 30 | Fill #1

## 2020-05-14 NOTE — Progress Notes (Signed)
Patient presents for Colpo for LGSIL and +HPV on 04/16/20.

## 2020-05-14 NOTE — Addendum Note (Signed)
Addended by: Baltazar Najjar A on: 05/14/2020 11:42 AM   Modules accepted: Orders

## 2020-05-14 NOTE — Progress Notes (Signed)
Colposcopy Procedure Note  Indications: Pap smear 1 months ago showed: low-grade squamous intraepithelial neoplasia (LGSIL - encompassing HPV,mild dysplasia,CIN I). The prior pap showed no abnormalities.  Prior cervical/vaginal disease: CIN 1. Prior cervical treatment: no treatment.  Procedure Details  The risks and benefits of the procedure and Written informed consent obtained.  A time-out was performed confirming the patient, procedure and allergy status  Speculum placed in vagina and excellent visualization of cervix achieved, cervix swabbed x 3 with acetic acid solution.  Findings: Cervix: no visible lesions, no mosaicism, no punctation and no abnormal vasculature; SCJ visualized 360 degrees without lesions, endocervical curettage performed, cervical biopsies taken at 6, 9, 12 o'clock, specimen labelled and sent to pathology and hemostasis achieved with silver nitrate.   Vaginal inspection: normal without visible lesions. Vulvar colposcopy: vulvar colposcopy not performed.   Physical Exam   Specimens: ECC and Cervical Biopsies  Complications: none.  Plan: Specimens labelled and sent to Pathology. Will base further treatment on Pathology findings. Treatment options discussed with patient. Post biopsy instructions given to patient. Return to discuss Pathology results in 2 weeks.   [name], MD 05/14/2020 10:53 AM

## 2020-05-15 DIAGNOSIS — F411 Generalized anxiety disorder: Secondary | ICD-10-CM | POA: Diagnosis not present

## 2020-05-15 DIAGNOSIS — Z79891 Long term (current) use of opiate analgesic: Secondary | ICD-10-CM | POA: Diagnosis not present

## 2020-05-15 LAB — SURGICAL PATHOLOGY

## 2020-05-21 ENCOUNTER — Telehealth: Payer: Self-pay | Admitting: Oncology

## 2020-05-21 NOTE — Telephone Encounter (Signed)
R/s appts per 3/28 sch msg. Pt aware.

## 2020-05-22 ENCOUNTER — Other Ambulatory Visit (HOSPITAL_COMMUNITY): Payer: Self-pay

## 2020-05-22 DIAGNOSIS — F411 Generalized anxiety disorder: Secondary | ICD-10-CM | POA: Diagnosis not present

## 2020-05-28 ENCOUNTER — Ambulatory Visit: Payer: Medicare Other | Admitting: Obstetrics

## 2020-06-07 ENCOUNTER — Other Ambulatory Visit (HOSPITAL_COMMUNITY): Payer: Self-pay

## 2020-06-08 ENCOUNTER — Other Ambulatory Visit: Payer: Self-pay | Admitting: Oncology

## 2020-06-11 ENCOUNTER — Other Ambulatory Visit (HOSPITAL_COMMUNITY): Payer: Self-pay

## 2020-06-11 MED FILL — Venetoclax Tab 100 MG: ORAL | 30 days supply | Qty: 30 | Fill #0 | Status: AC

## 2020-06-11 MED FILL — Venetoclax Tab 100 MG: ORAL | 30 days supply | Qty: 30 | Fill #0 | Status: CN

## 2020-06-12 ENCOUNTER — Other Ambulatory Visit (HOSPITAL_COMMUNITY): Payer: Self-pay

## 2020-06-13 ENCOUNTER — Other Ambulatory Visit (HOSPITAL_COMMUNITY): Payer: Self-pay

## 2020-06-14 DIAGNOSIS — H66001 Acute suppurative otitis media without spontaneous rupture of ear drum, right ear: Secondary | ICD-10-CM | POA: Diagnosis not present

## 2020-06-14 DIAGNOSIS — J3081 Allergic rhinitis due to animal (cat) (dog) hair and dander: Secondary | ICD-10-CM | POA: Diagnosis not present

## 2020-06-18 ENCOUNTER — Other Ambulatory Visit (HOSPITAL_COMMUNITY): Payer: Self-pay

## 2020-06-19 ENCOUNTER — Other Ambulatory Visit: Payer: Self-pay

## 2020-06-19 ENCOUNTER — Inpatient Hospital Stay: Payer: Medicare Other

## 2020-06-19 ENCOUNTER — Inpatient Hospital Stay: Payer: Medicare Other | Attending: Oncology

## 2020-06-19 DIAGNOSIS — Z95828 Presence of other vascular implants and grafts: Secondary | ICD-10-CM

## 2020-06-19 DIAGNOSIS — C9110 Chronic lymphocytic leukemia of B-cell type not having achieved remission: Secondary | ICD-10-CM | POA: Diagnosis not present

## 2020-06-19 DIAGNOSIS — C8300 Small cell B-cell lymphoma, unspecified site: Secondary | ICD-10-CM

## 2020-06-19 LAB — CBC WITH DIFFERENTIAL/PLATELET
Abs Immature Granulocytes: 0.03 10*3/uL (ref 0.00–0.07)
Basophils Absolute: 0 10*3/uL (ref 0.0–0.1)
Basophils Relative: 1 %
Eosinophils Absolute: 0.2 10*3/uL (ref 0.0–0.5)
Eosinophils Relative: 4 %
HCT: 40.3 % (ref 36.0–46.0)
Hemoglobin: 13.1 g/dL (ref 12.0–15.0)
Immature Granulocytes: 1 %
Lymphocytes Relative: 26 %
Lymphs Abs: 1 10*3/uL (ref 0.7–4.0)
MCH: 28.5 pg (ref 26.0–34.0)
MCHC: 32.5 g/dL (ref 30.0–36.0)
MCV: 87.8 fL (ref 80.0–100.0)
Monocytes Absolute: 0.6 10*3/uL (ref 0.1–1.0)
Monocytes Relative: 14 %
Neutro Abs: 2.2 10*3/uL (ref 1.7–7.7)
Neutrophils Relative %: 54 %
Platelets: 119 10*3/uL — ABNORMAL LOW (ref 150–400)
RBC: 4.59 MIL/uL (ref 3.87–5.11)
RDW: 13.8 % (ref 11.5–15.5)
WBC: 4 10*3/uL (ref 4.0–10.5)
nRBC: 0 % (ref 0.0–0.2)

## 2020-06-19 LAB — COMPREHENSIVE METABOLIC PANEL
ALT: 30 U/L (ref 0–44)
AST: 29 U/L (ref 15–41)
Albumin: 3.6 g/dL (ref 3.5–5.0)
Alkaline Phosphatase: 108 U/L (ref 38–126)
Anion gap: 8 (ref 5–15)
BUN: 11 mg/dL (ref 8–23)
CO2: 26 mmol/L (ref 22–32)
Calcium: 9 mg/dL (ref 8.9–10.3)
Chloride: 106 mmol/L (ref 98–111)
Creatinine, Ser: 0.88 mg/dL (ref 0.44–1.00)
GFR, Estimated: >60 mL/min (ref >60)
Glucose, Bld: 99 mg/dL (ref 70–99)
Potassium: 3.9 mmol/L (ref 3.5–5.1)
Sodium: 140 mmol/L (ref 135–145)
Total Bilirubin: 0.5 mg/dL (ref 0.3–1.2)
Total Protein: 5.8 g/dL — ABNORMAL LOW (ref 6.5–8.1)

## 2020-06-19 LAB — MAGNESIUM: Magnesium: 1.8 mg/dL (ref 1.7–2.4)

## 2020-06-19 MED ORDER — HEPARIN SOD (PORK) LOCK FLUSH 100 UNIT/ML IV SOLN
500.0000 [IU] | Freq: Once | INTRAVENOUS | Status: AC
  Administered 2020-06-19: 500 [IU]
  Filled 2020-06-19: qty 5

## 2020-06-19 MED ORDER — SODIUM CHLORIDE 0.9% FLUSH
10.0000 mL | Freq: Once | INTRAVENOUS | Status: AC
  Administered 2020-06-19: 10 mL
  Filled 2020-06-19: qty 10

## 2020-06-20 ENCOUNTER — Telehealth: Payer: Self-pay

## 2020-06-20 NOTE — Telephone Encounter (Signed)
Returned call to pt regarding Keto diet. Informed pt ok per MD for her to go on the Keto diet. Pt acknowledged.

## 2020-06-26 ENCOUNTER — Other Ambulatory Visit: Payer: Medicare Other

## 2020-07-07 IMAGING — CT CT ABDOMEN AND PELVIS WITH CONTRAST
2 of 5 series · 14 of 46 positions shown, 16 images · IV contrast (OMNIPAQUE 300)
Comparison: February 08, 2016

CLINICAL DATA: Abdominal pain and fever.  History of leukemia

EXAM:
CT ABDOMEN AND PELVIS WITH CONTRAST
TECHNIQUE: Multidetector CT imaging of the abdomen and pelvis was performed
using the standard protocol following bolus administration of
intravenous contrast.
CONTRAST:  100mL OMNIPAQUE IOHEXOL 300 MG/ML  SOLN

[Series 2: axial st · axial · 0.74mm/px · z∈[-601,-211]mm · 11 of 94 slices shown, 13 images]
[im 8/94  soft-tissue]
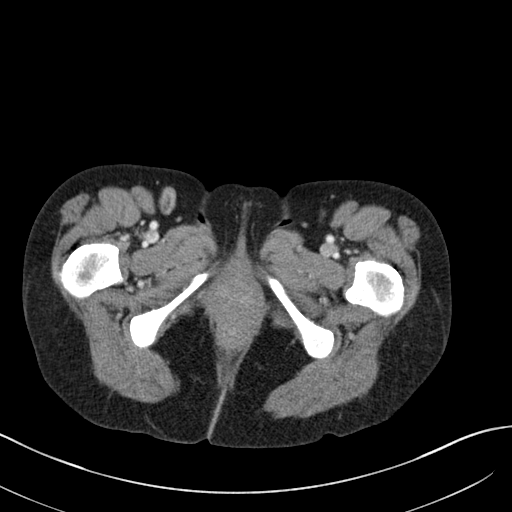
[im 8/94  bone]
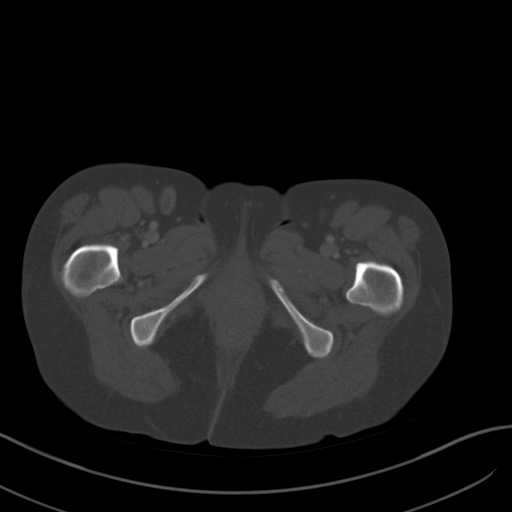
[im 16/94  soft-tissue]
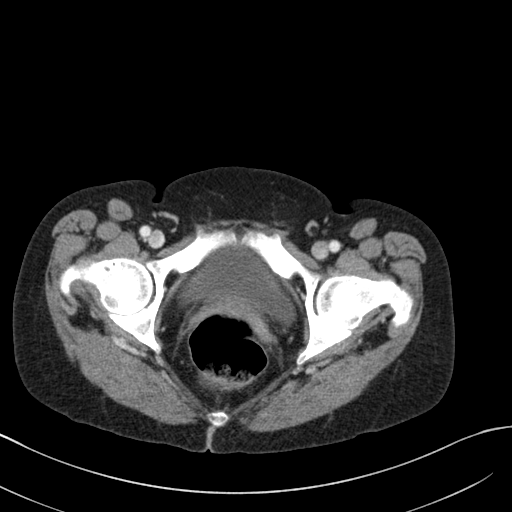
[im 24/94  soft-tissue]
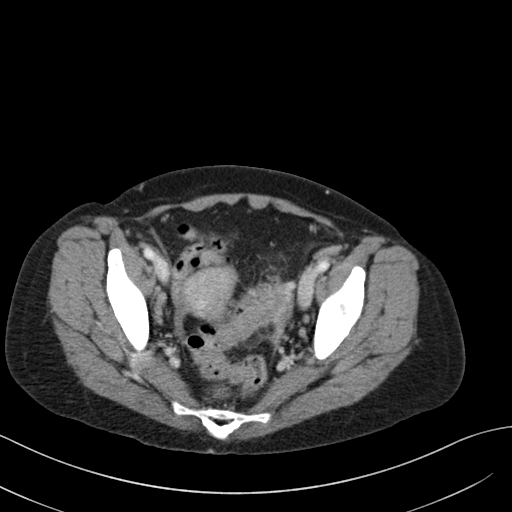
[im 32/94  soft-tissue]
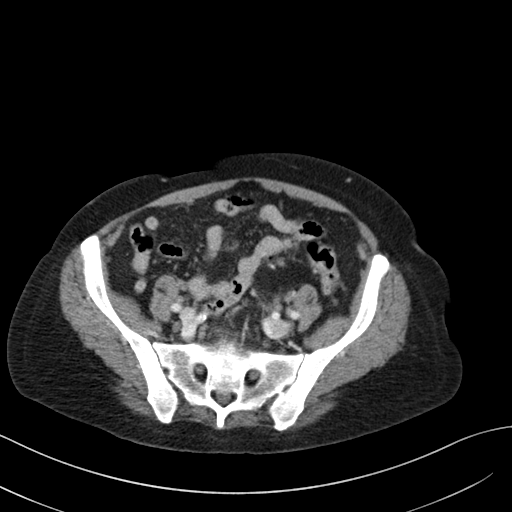
[im 39/94  soft-tissue]
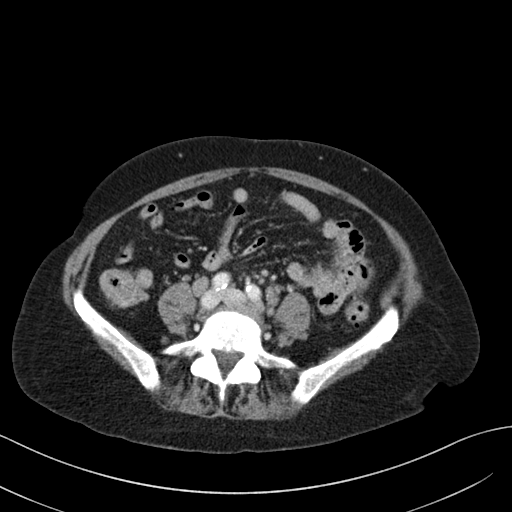
[im 47/94  soft-tissue]
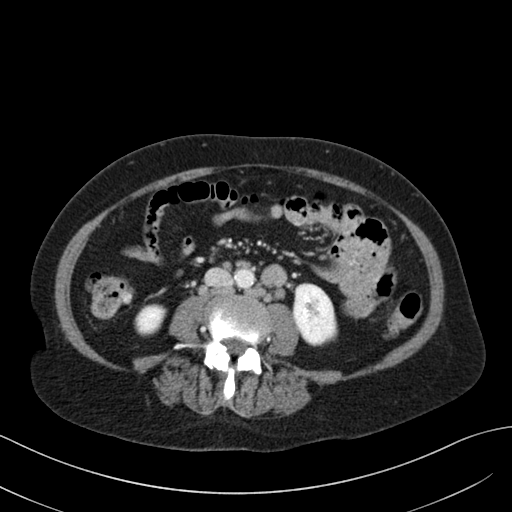
[im 55/94  soft-tissue]
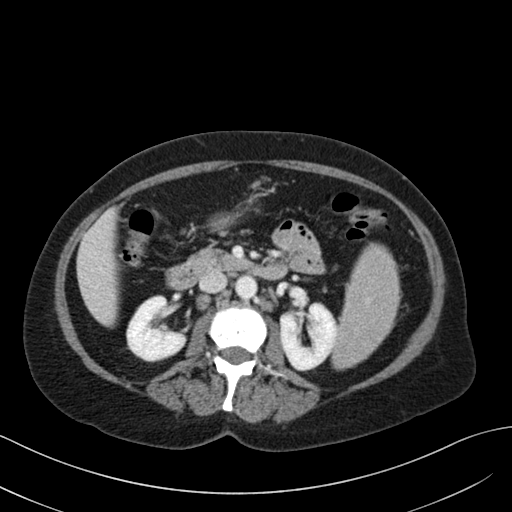
[im 63/94  soft-tissue]
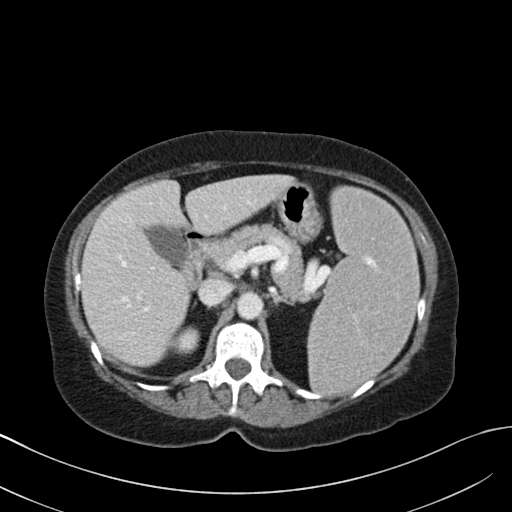
[im 70/94  soft-tissue]
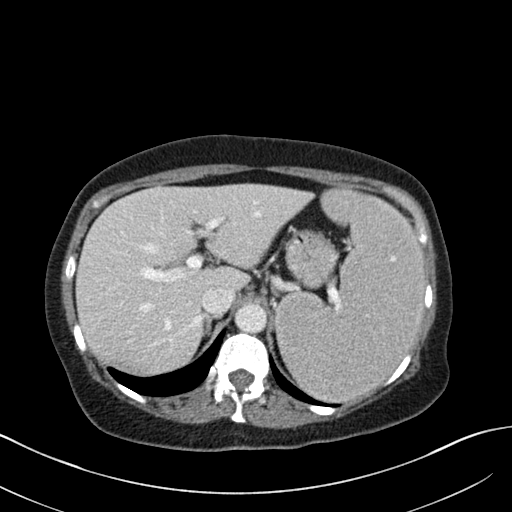
[im 70/94  bone]
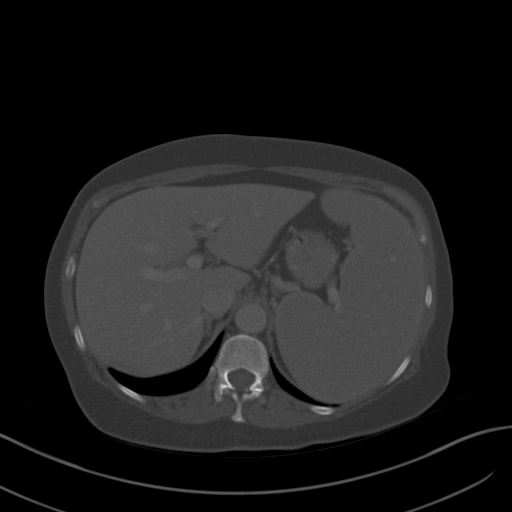
[im 78/94  soft-tissue]
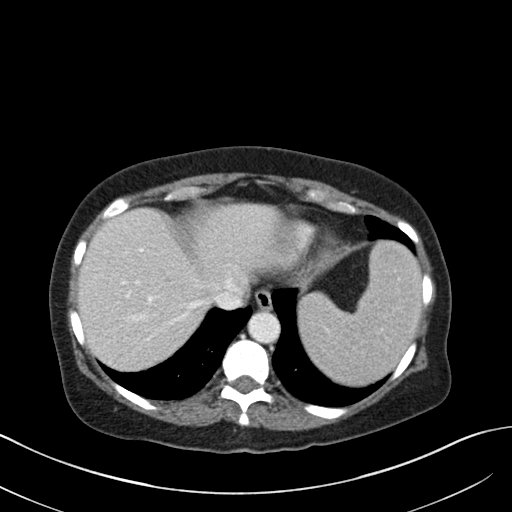
[im 86/94  soft-tissue]
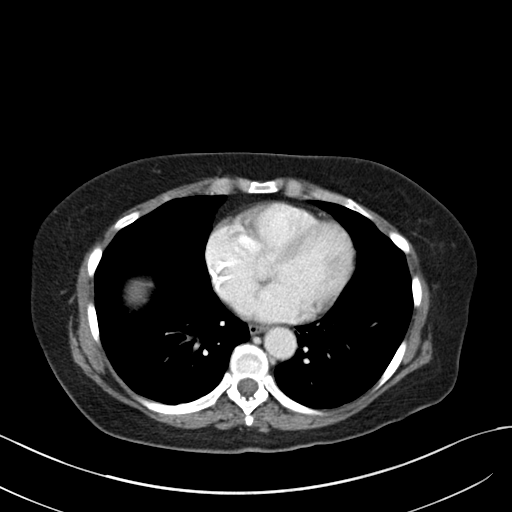

[Series 5: coronal st · coronal · 0.76mm/px · 3 of 143 slices shown]
[im 48/143  soft-tissue]
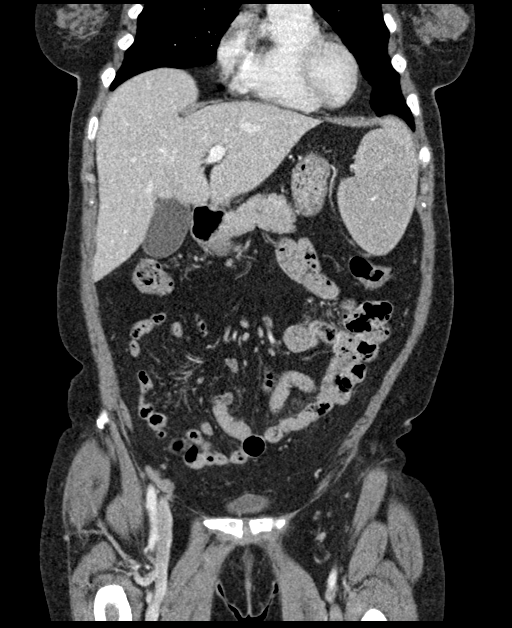
[im 64/143  soft-tissue]
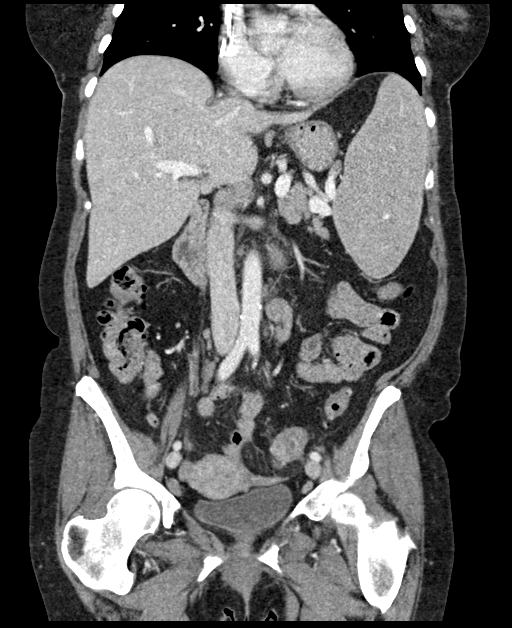
[im 79/143  soft-tissue]
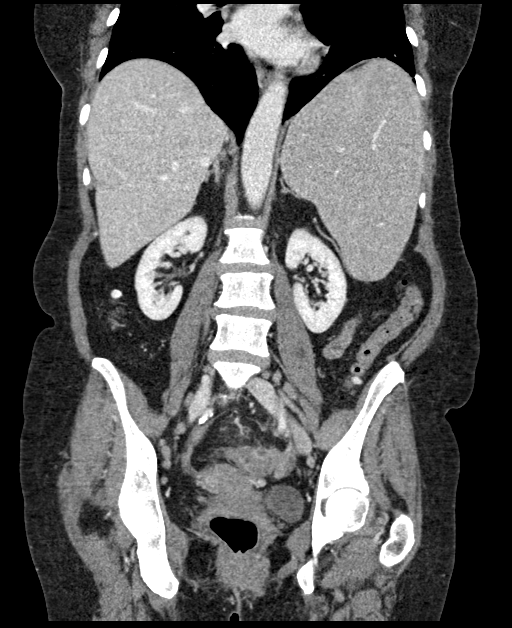

[14 of 46 positions shown; findings below may reference images not displayed]

FINDINGS: Lower chest: Lung bases are clear. There are foci of coronary artery
calcification.

Hepatobiliary: No focal liver lesions are evident. The gallbladder
wall is not appreciably thickened. There is no biliary duct
dilatation.

Pancreas: There is no pancreatic mass or inflammatory focus.

Spleen: Spleen measures 16.1 x 16.9 x 10.4 cm with a measured
splenic volume of 1,4 no focal splenic lesions are demonstrable. 15
cubic cm.

Adrenals/Urinary Tract: Adrenals appear normal bilaterally. Kidneys
bilaterally show no evident hydronephrosis on either side. There is
a cyst in the medial mid left kidney measuring 1.1 x 0.7 cm. There
is no evident renal or ureteral calculus on either side. Urinary
bladder is midline with wall thickness within normal limits.

Stomach/Bowel: There is wall thickening throughout the mid to distal
sigmoid colon. There are multiple diverticula in this area, with
surrounding mesenteric stranding and mild fluid. Note that there is
inflammation of several epiploic appendages in the sigmoid colon.
There is no abscess or perforation in the sigmoid colon region.
Elsewhere there are scattered diverticula in the ascending and
descending colon without inflammatory changes. There is no bowel
obstruction. Terminal ileum appears unremarkable. No free air or
portal venous air.

Vascular/Lymphatic: There is aortic and iliac artery
atherosclerosis. No aneurysm evident.

There are multiple retroperitoneal lymph nodes, most subcentimeter.
There is a lymph node located between the lower pole left kidney and
the aorta which measures 2.1 x 1.8 cm. No adenopathy is noted
elsewhere. Inguinal lymph nodes are upper normal in size and contain
fatty hila, suggesting benign etiology.

Reproductive: Uterus is anteverted.  No pelvic mass is demonstrable.

Other: Appendix appears normal. No abscess or ascites is evident in
the abdomen or pelvis.

Musculoskeletal: There is degenerative change in the lower lumbar
spine. There are no blastic or lytic bone lesions. No intramuscular
or abdominal wall lesions are noted.
IMPRESSION: 1. Splenomegaly. Retroperitoneal adenopathy, more severe on the left
than on the right. Neoplastic etiology must be of concern. Patient
reportedly has history of leukemia; recurrence of leukemia could
account for these findings. Lymphoma is a differential
consideration. Note that the spleen is significantly larger than on
most recent study from January 2016.

2. Sigmoid inflammation, likely due to diverticulitis. There is also
inflammation of epiploic appendages in this area. There may be a
degree of concomitant epiploic appendagitis. No abscess or
perforation evident in this area. Elsewhere there are scattered
colonic diverticula without inflammation.

3. No bowel obstruction. No abscess elsewhere in the abdomen or
pelvis. Appendix appears normal.

4. There is aortoiliac atherosclerosis. There are foci of coronary
artery calcification.

## 2020-07-24 ENCOUNTER — Other Ambulatory Visit (HOSPITAL_COMMUNITY): Payer: Self-pay

## 2020-07-27 ENCOUNTER — Other Ambulatory Visit (HOSPITAL_COMMUNITY): Payer: Self-pay

## 2020-07-27 MED FILL — Venetoclax Tab 100 MG: ORAL | 30 days supply | Qty: 30 | Fill #1 | Status: AC

## 2020-07-31 ENCOUNTER — Other Ambulatory Visit (HOSPITAL_COMMUNITY): Payer: Self-pay

## 2020-08-02 ENCOUNTER — Other Ambulatory Visit (HOSPITAL_COMMUNITY): Payer: Self-pay

## 2020-08-23 ENCOUNTER — Other Ambulatory Visit: Payer: Self-pay | Admitting: Oncology

## 2020-08-24 ENCOUNTER — Encounter: Payer: Self-pay | Admitting: Oncology

## 2020-08-28 ENCOUNTER — Other Ambulatory Visit (HOSPITAL_COMMUNITY): Payer: Self-pay

## 2020-08-28 MED FILL — Venetoclax Tab 100 MG: ORAL | 30 days supply | Qty: 30 | Fill #2 | Status: AC

## 2020-08-29 ENCOUNTER — Other Ambulatory Visit (HOSPITAL_COMMUNITY): Payer: Self-pay

## 2020-08-30 ENCOUNTER — Other Ambulatory Visit (HOSPITAL_COMMUNITY): Payer: Self-pay

## 2020-09-04 ENCOUNTER — Other Ambulatory Visit (HOSPITAL_COMMUNITY): Payer: Self-pay

## 2020-09-24 ENCOUNTER — Other Ambulatory Visit (HOSPITAL_COMMUNITY): Payer: Self-pay

## 2020-09-24 MED FILL — Venetoclax Tab 100 MG: ORAL | 30 days supply | Qty: 30 | Fill #3 | Status: AC

## 2020-09-26 ENCOUNTER — Inpatient Hospital Stay: Payer: Medicare Other | Attending: Oncology

## 2020-09-26 ENCOUNTER — Other Ambulatory Visit: Payer: Medicare Other

## 2020-09-26 ENCOUNTER — Inpatient Hospital Stay (HOSPITAL_BASED_OUTPATIENT_CLINIC_OR_DEPARTMENT_OTHER): Payer: Medicare Other | Admitting: Oncology

## 2020-09-26 ENCOUNTER — Other Ambulatory Visit: Payer: Self-pay

## 2020-09-26 ENCOUNTER — Other Ambulatory Visit (HOSPITAL_COMMUNITY): Payer: Self-pay

## 2020-09-26 VITALS — BP 101/50 | HR 68 | Temp 97.7°F | Resp 18 | Ht <= 58 in | Wt 124.8 lb

## 2020-09-26 DIAGNOSIS — C9110 Chronic lymphocytic leukemia of B-cell type not having achieved remission: Secondary | ICD-10-CM

## 2020-09-26 DIAGNOSIS — Z95828 Presence of other vascular implants and grafts: Secondary | ICD-10-CM

## 2020-09-26 DIAGNOSIS — C8300 Small cell B-cell lymphoma, unspecified site: Secondary | ICD-10-CM | POA: Diagnosis not present

## 2020-09-26 LAB — COMPREHENSIVE METABOLIC PANEL
ALT: 25 U/L (ref 0–44)
AST: 27 U/L (ref 15–41)
Albumin: 3.7 g/dL (ref 3.5–5.0)
Alkaline Phosphatase: 76 U/L (ref 38–126)
Anion gap: 9 (ref 5–15)
BUN: 15 mg/dL (ref 8–23)
CO2: 23 mmol/L (ref 22–32)
Calcium: 8.8 mg/dL — ABNORMAL LOW (ref 8.9–10.3)
Chloride: 108 mmol/L (ref 98–111)
Creatinine, Ser: 1.13 mg/dL — ABNORMAL HIGH (ref 0.44–1.00)
GFR, Estimated: 52 mL/min — ABNORMAL LOW (ref >60)
Glucose, Bld: 85 mg/dL (ref 70–99)
Potassium: 3.9 mmol/L (ref 3.5–5.1)
Sodium: 140 mmol/L (ref 135–145)
Total Bilirubin: 0.6 mg/dL (ref 0.3–1.2)
Total Protein: 5.9 g/dL — ABNORMAL LOW (ref 6.5–8.1)

## 2020-09-26 LAB — CBC WITH DIFFERENTIAL/PLATELET
Abs Immature Granulocytes: 0.02 10*3/uL (ref 0.00–0.07)
Basophils Absolute: 0 10*3/uL (ref 0.0–0.1)
Basophils Relative: 0 %
Eosinophils Absolute: 0.1 10*3/uL (ref 0.0–0.5)
Eosinophils Relative: 2 %
HCT: 41.8 % (ref 36.0–46.0)
Hemoglobin: 13.8 g/dL (ref 12.0–15.0)
Immature Granulocytes: 0 %
Lymphocytes Relative: 22 %
Lymphs Abs: 1.1 10*3/uL (ref 0.7–4.0)
MCH: 29.4 pg (ref 26.0–34.0)
MCHC: 33 g/dL (ref 30.0–36.0)
MCV: 88.9 fL (ref 80.0–100.0)
Monocytes Absolute: 0.6 10*3/uL (ref 0.1–1.0)
Monocytes Relative: 12 %
Neutro Abs: 3.2 10*3/uL (ref 1.7–7.7)
Neutrophils Relative %: 64 %
Platelets: 109 10*3/uL — ABNORMAL LOW (ref 150–400)
RBC: 4.7 MIL/uL (ref 3.87–5.11)
RDW: 13.2 % (ref 11.5–15.5)
WBC: 5.1 10*3/uL (ref 4.0–10.5)
nRBC: 0 % (ref 0.0–0.2)

## 2020-09-26 LAB — LACTATE DEHYDROGENASE: LDH: 195 U/L — ABNORMAL HIGH (ref 98–192)

## 2020-09-26 LAB — MAGNESIUM: Magnesium: 2 mg/dL (ref 1.7–2.4)

## 2020-09-26 MED ORDER — SODIUM CHLORIDE 0.9% FLUSH
10.0000 mL | Freq: Once | INTRAVENOUS | Status: AC
  Administered 2020-09-26: 10 mL
  Filled 2020-09-26: qty 10

## 2020-09-26 MED ORDER — HEPARIN SOD (PORK) LOCK FLUSH 100 UNIT/ML IV SOLN
500.0000 [IU] | Freq: Once | INTRAVENOUS | Status: AC
  Administered 2020-09-26: 500 [IU]
  Filled 2020-09-26: qty 5

## 2020-09-26 NOTE — Progress Notes (Signed)
[place]  Telephone:([phone] Fax:([phone]) [phone]     ID: Rethia Key [place]   DOB: Nov 01, 1950  MR#: [id number]  [id number]  Patient Care Team: Leeroy Cha, MD as PCP - General (Internal Medicine) Nance Mccombs, Virgie Dad, MD as Consulting Physician (Oncology) [name], MD as Consulting Physician (Obstetrics and Gynecology) Vallarie Mare, MD as Consulting Physician (Neurosurgery) OTHER MD:  CHIEF COMPLAINT: Chronic lymphocytic leukemia  CURRENT TREATMENT: venetoclax, ibrutinib   INTERVAL HISTORY: Rachel [name] returns today for follow-up of her chronic lymphocytic leukemia/small-lymphocytic lymphoma.  She remains very stable on her medications  She take venetoclax, at 100 mg a day.    She has no symptoms associated with it that she is aware of.  She obtains it through our [employer]  She also continues on ibrutinib at 280 mg daily.  She has no side effects from it other than minimal bruising.  She tells me whenever she stops taking this drug her warts come back  Since her last visit, she underwent routine pap smear on 04/16/2020 showing high risk HPV and low grade squamous intraepithelial lesion (LSIL).  She proceeded to colposcopy on 05/14/20 for further evaluation. Pathology from the procedure showed: Endocervical, curettage - focal koilocytic atypia, consistent with LSIL 2. Cervix, biopsy  - koilocytic atypia, consistent with LSIL   REVIEW OF SYSTEMS: Rachel [name] is very upset because her oldest son has become estranged.  She is exercising about 3 times a week, chiefly by walking.  She has had no fevers no drenching sweats no unexplained fatigue or weight loss and no palpable adenopathy that she is aware of.  A detailed review of systems today was otherwise stable.   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 She has been treated with Rituxan, ofatumumab, cladribine, bendamustine and currently with ibrutinib, with details summarized below.   PAST MEDICAL HISTORY: 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   Diverticulitis    Leukemia ([place])    Lower back pain     PAST SURGICAL HISTORY: Past Surgical History:  Procedure Laterality Date   BREAST SURGERY  2015   left breast bx   BUNIONECTOMY     right foot   lining of uterus removed     OTHER SURGICAL HISTORY     removal of uterine lining   TONSILLECTOMY      FAMILY HISTORY Family History  Problem Relation Age of Onset   Depression Son   The patient's mother will turn 66 on March 2021   GYNECOLOGIC HISTORY: Menarche age 35, first live birth age 49, she is [place] P3. She stopped having periods in 1999 after endometrial stripping. She continues on hormone replacement.   SOCIAL HISTORY: [name] is retired from [employer].  The patient and her husband are now living in [place].  Her son [name] lives in [place]; he works for a [employer]. A second son lives in [place] and has two children. Son [name] lives in [place]. She has 8 grandchildren, the oldest is 69 and the youngest is 68.  ADVANCED DIRECTIVES: In place   HEALTH MAINTENANCE: Social History   Tobacco Use   Smoking status: Never   Smokeless tobacco: Never  Vaping Use   Vaping Use: Never used  Substance Use Topics  Alcohol use: Yes    Alcohol/week: 0.0 standard drinks    Comment: 2 shots a day   Drug use: No     Colonoscopy: 12/2018 (Dr. Collene Mares), repeat in 10 years  PAP: December 2012/ [name]  Bone density: Due  Lipid panel: per Dr Drema Dallas  Allergies  Allergen Reactions   Penicillins Anaphylaxis    "stopped breathing" "drops my heartbeat" "I just pass out"   Lidocaine-Tetracaine-Epineph Other (See Comments)     Pt came in for CODE STROKE after receiving Lidocaine with Epi in dental office. No stroke, but had symptoms.     Current Outpatient Medications  Medication Sig Dispense Refill   acetaminophen (TYLENOL) 500 MG tablet Take 500-1,000 mg by mouth every 6 (six) hours as needed for headache.      albuterol (VENTOLIN HFA) 108 (90 Base) MCG/ACT inhaler Inhale 1-2 puffs into the lungs every 6 (six) hours as needed for shortness of breath or wheezing.     allopurinol (ZYLOPRIM) 300 MG tablet TAKE 1 TABLET DAILY 90 tablet 3   azelastine (ASTELIN) 0.1 % nasal spray Place into both nostrils 2 (two) times daily. Use in each nostril as directed     betamethasone dipropionate (DIPROLENE) 0.05 % ointment Apply topically 2 (two) times daily.     Calcium Citrate-Vitamin D 200-250 MG-UNIT TABS Take 1 tablet by mouth 4 (four) times daily.     Cholecalciferol (VITAMIN D-3) 125 MCG (5000 UT) TABS Take 2 tablets by mouth daily.     Estradiol (ELESTRIN) 0.52 MG/0.87 GM (0.06%) GEL APPLY 2 PUMPS THINLY TO UPPER ARM DAILY AT BEDTIME (Patient taking differently: Apply 1 application topically at bedtime. APPLY 2 PUMPS THINLY TO UPPER ARM DAILY AT BEDTIME) 200 g 3   Estradiol (ELESTRIN) 0.52 MG/0.87 GM (0.06%) GEL Apply 1 application topically at bedtime. 200 g 3   famotidine (PEPCID) 20 MG tablet Take 20 mg by mouth at bedtime.  (Patient not taking: Reported on 05/14/2020)     IMBRUVICA 280 MG TABS TAKE 1 TABLET DAILY 56 tablet 5   lidocaine-prilocaine (EMLA) cream Apply a nickel size amount to skin on top of port- cover with saran wrap at least 1 hour before access 30 g 0   Magnesium 200 MG TABS Take 2 tablets by mouth daily.     methocarbamol (ROBAXIN) 500 MG tablet Take 2 tablets (1,000 mg total) by mouth every 8 (eight) hours as needed for muscle spasms. (Patient not taking: Reported on 05/14/2020) 60 tablet 0   montelukast (SINGULAIR) 10 MG tablet Take 10 mg by mouth at bedtime.     progesterone (PROMETRIUM) 200 MG  capsule Take 1 capsule (200 mg total) by mouth at bedtime. 90 capsule 3   traZODone (DESYREL) 100 MG tablet TAKE 1 TABLET AT BEDTIME 90 tablet 3   valACYclovir (VALTREX) 1000 MG tablet Take one tablet by mouth twice daily for 5 days, then one tablet daily (Patient not taking: Reported on 05/14/2020) 80 tablet 6   venetoclax (VENCLEXTA) 100 MG tablet TAKE 1 TABLET (100 MG) BY MOUTH DAILY. TAKE WITH FOOD AND WATER AT APPROXIMATELY THE SAME TIME EACH DAY. 30 tablet 6   Zinc 30 MG TABS Take 1 tablet by mouth daily.     No current facility-administered medications for this visit.    OBJECTIVE: white woman in no acute distress  Vitals:   09/26/20 1130  BP: (!) 101/50  Pulse: 68  Resp: 18  Temp: 97.7 F (36.5 C)  SpO2: 100%  Wt Readings from Last 3 Encounters:  09/26/20 124 lb 12.8 oz (56.6 kg)  05/14/20 125 lb 6.4 oz (56.9 kg)  04/16/20 124 lb (56.2 kg)   Body mass index is 26.54 kg/m.    ECOG FS:1 - Symptomatic but completely ambulatory  Sclerae unicteric, EOMs intact Wearing a mask No cervical or supraclavicular adenopathy, no axillary or inguinal adenopathy Lungs no rales or rhonchi Heart regular rate and rhythm Abd soft, nontender, positive bowel sounds MSK no focal spinal tenderness, no upper extremity lymphedema Neuro: nonfocal, well oriented, appropriate affect Breasts: Deferred   LAB RESULTS:  Lab Results  Component Value Date   WBC 5.1 09/26/2020   NEUTROABS 3.2 09/26/2020   HGB 13.8 09/26/2020   HCT 41.8 09/26/2020   MCV 88.9 09/26/2020   PLT 109 (L) 09/26/2020      Chemistry      Component Value Date/Time   NA 140 06/19/2020 0949   NA 140 12/31/2016 1406   K 3.9 06/19/2020 0949   K 3.6 12/31/2016 1406   CL 106 06/19/2020 0949   CL 101 12/15/2011 1322   CO2 26 06/19/2020 0949   CO2 24 12/31/2016 1406   BUN 11 06/19/2020 0949   BUN 12.8 12/31/2016 1406   CREATININE 0.88 06/19/2020 0949   CREATININE 0.91 07/18/2019 1020   CREATININE 0.9  12/31/2016 1406      Component Value Date/Time   CALCIUM 9.0 06/19/2020 0949   CALCIUM 8.8 12/31/2016 1406   ALKPHOS 108 06/19/2020 0949   ALKPHOS 71 12/31/2016 1406   AST 29 06/19/2020 0949   AST 72 (H) 07/18/2019 1020   AST 18 12/31/2016 1406   ALT 30 06/19/2020 0949   ALT 100 (H) 07/18/2019 1020   ALT 11 12/31/2016 1406   BILITOT 0.5 06/19/2020 0949   BILITOT 0.4 07/18/2019 1020   BILITOT 0.49 12/31/2016 1406      STUDIES: No results found.   ASSESSMENT: 70 y.o.  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 rigors 02/27/2014: received IV vancomycin and levaquin briefly, then levaquin po (pcn allergy)  (6) bendamustine/ rituximab for 6 cycles, completed 05/04/2014, with improvement but not normalization of the absolute lymphocyte count  (7) ibrutinib started 05/08/2014, currently at 280 mg daily. Stopped by patient in June, 2019 restarted in September, 2019, discontinued October 2020  (a) hepatitis B studies 03/06/2014 and 12/31/2016, negative  (b) ibrutinib restarted 01/06/2019 (at 280 mg daily) in conjunction with venetoclax  (c) ibrutinib held after 10/18/2019, now used as needed for wart control  (8) CT abd/pelvis 10/22/2018 shows retroperitoneal adenopathy, increased splenomegaly  (a) repeat CT scan of the neck chest abdomen and pelvis  07/02/2019, with resolution of the previously noted adenopathy and splenomegaly.  (9) started venetoclax 12/15/2018  (a) uneventful ramp-up to the 100 mg a day dose, then held at that dose due to symptoms  (b) repeat hepatitis panel 10/18/2019 again negative   PLAN: Rachel [name] is now  more than 20 years out from initial diagnosis of chronic lymphoid leukemia.  Her disease is very well controlled on her current medications and she is tolerating them well.  Accordingly we are making no changes.  She continues low-dose venetoclax and she will continue the ibrutinib at the current dose.  She had Evusheld 05/01/2020-- this needs to be repeated in September.  Those doses are being entered.  We discussed her family situation in detail  She will return to see me in 3 months.  She knows to call for any other issue that may develop before then  Total encounter time 20 minutes.Sarajane Jews C. Mahamed Zalewski, MD 09/26/20 11:58 AM Medical Oncology and Hematology [facility] [place], [place] [address] Tel. [phone]    Fax. [phone]   I, Wilburn Mylar, am acting as scribe for Dr. Virgie Dad. Jariyah Hackley.  I, Lurline Del MD, have reviewed the above documentation for accuracy and completeness, and I agree with the above.   *Total Encounter Time as defined by the Centers for Medicare and Medicaid Services includes, in addition to the face-to-face time of a patient visit (documented in the note above) non-face-to-face time: obtaining and reviewing outside history, ordering and reviewing medications, tests or procedures, care coordination (communications with other health care professionals or caregivers) and documentation in the medical record.

## 2020-09-27 LAB — BETA 2 MICROGLOBULIN, SERUM: Beta-2 Microglobulin: 3.4 mg/L — ABNORMAL HIGH (ref 0.6–2.4)

## 2020-10-12 ENCOUNTER — Other Ambulatory Visit: Payer: Self-pay | Admitting: Adult Health

## 2020-10-17 DIAGNOSIS — J309 Allergic rhinitis, unspecified: Secondary | ICD-10-CM | POA: Diagnosis not present

## 2020-10-17 DIAGNOSIS — N183 Chronic kidney disease, stage 3 unspecified: Secondary | ICD-10-CM | POA: Diagnosis not present

## 2020-10-17 DIAGNOSIS — K5730 Diverticulosis of large intestine without perforation or abscess without bleeding: Secondary | ICD-10-CM | POA: Diagnosis not present

## 2020-10-17 DIAGNOSIS — K219 Gastro-esophageal reflux disease without esophagitis: Secondary | ICD-10-CM | POA: Diagnosis not present

## 2020-10-17 DIAGNOSIS — C9110 Chronic lymphocytic leukemia of B-cell type not having achieved remission: Secondary | ICD-10-CM | POA: Diagnosis not present

## 2020-10-17 DIAGNOSIS — F3341 Major depressive disorder, recurrent, in partial remission: Secondary | ICD-10-CM | POA: Diagnosis not present

## 2020-10-17 DIAGNOSIS — Z0000 Encounter for general adult medical examination without abnormal findings: Secondary | ICD-10-CM | POA: Diagnosis not present

## 2020-10-17 DIAGNOSIS — M810 Age-related osteoporosis without current pathological fracture: Secondary | ICD-10-CM | POA: Diagnosis not present

## 2020-10-19 DIAGNOSIS — Z1231 Encounter for screening mammogram for malignant neoplasm of breast: Secondary | ICD-10-CM | POA: Diagnosis not present

## 2020-10-22 ENCOUNTER — Other Ambulatory Visit (HOSPITAL_COMMUNITY): Payer: Self-pay

## 2020-10-24 ENCOUNTER — Other Ambulatory Visit (HOSPITAL_COMMUNITY): Payer: Self-pay

## 2020-10-26 ENCOUNTER — Other Ambulatory Visit (HOSPITAL_COMMUNITY): Payer: Self-pay

## 2020-10-26 DIAGNOSIS — H2513 Age-related nuclear cataract, bilateral: Secondary | ICD-10-CM | POA: Diagnosis not present

## 2020-10-26 DIAGNOSIS — H2512 Age-related nuclear cataract, left eye: Secondary | ICD-10-CM | POA: Diagnosis not present

## 2020-10-30 ENCOUNTER — Telehealth: Payer: Self-pay | Admitting: *Deleted

## 2020-10-30 DIAGNOSIS — H6503 Acute serous otitis media, bilateral: Secondary | ICD-10-CM | POA: Diagnosis not present

## 2020-10-30 DIAGNOSIS — R197 Diarrhea, unspecified: Secondary | ICD-10-CM | POA: Diagnosis not present

## 2020-10-30 NOTE — Telephone Encounter (Signed)
Error

## 2020-10-31 ENCOUNTER — Other Ambulatory Visit (HOSPITAL_COMMUNITY): Payer: Self-pay

## 2020-10-31 ENCOUNTER — Telehealth: Payer: Self-pay | Admitting: *Deleted

## 2020-10-31 MED FILL — Venetoclax Tab 100 MG: ORAL | 30 days supply | Qty: 30 | Fill #4 | Status: AC

## 2020-10-31 NOTE — Telephone Encounter (Signed)
Pt left VM stating increased diarrhea occurring. She states she uses imodium with benefit but then becomes constipated.  Per return call she states since leaving the message she had gone to her primary MD who is checking her for C Difficile.  This RN discussed concern with diarrhea and imbruvica use - she states the diarrhea she is currently having is " very different and much more watery then the diarrhea I have had with the imbruvica.  She states at the most she had 2 loose stools with with the Imbruvica and was able to control it better.  She states over the past 2 weeks she has had up to 10 loose stools a day- ( not always a lot ).  She states she is hydrating.  Per discussion- she will keep this office updated post obtaining results from her primary MD per stool results.  This RN will follow up later this week.

## 2020-11-06 ENCOUNTER — Other Ambulatory Visit: Payer: Self-pay

## 2020-11-06 ENCOUNTER — Inpatient Hospital Stay: Payer: Medicare Other

## 2020-11-06 ENCOUNTER — Other Ambulatory Visit: Payer: Self-pay | Admitting: Adult Health

## 2020-11-06 ENCOUNTER — Inpatient Hospital Stay: Payer: Medicare Other | Attending: Oncology

## 2020-11-06 DIAGNOSIS — Z298 Encounter for other specified prophylactic measures: Secondary | ICD-10-CM | POA: Diagnosis not present

## 2020-11-06 DIAGNOSIS — C9110 Chronic lymphocytic leukemia of B-cell type not having achieved remission: Secondary | ICD-10-CM | POA: Insufficient documentation

## 2020-11-06 DIAGNOSIS — R197 Diarrhea, unspecified: Secondary | ICD-10-CM | POA: Diagnosis not present

## 2020-11-06 MED ORDER — TIXAGEVIMAB (PART OF EVUSHELD) INJECTION
300.0000 mg | Freq: Once | INTRAMUSCULAR | Status: AC
  Administered 2020-11-06: 300 mg via INTRAMUSCULAR
  Filled 2020-11-06: qty 3

## 2020-11-06 MED ORDER — CILGAVIMAB (PART OF EVUSHELD) INJECTION
300.0000 mg | Freq: Once | INTRAMUSCULAR | Status: AC
  Administered 2020-11-06: 300 mg via INTRAMUSCULAR
  Filled 2020-11-06: qty 3

## 2020-11-06 NOTE — Progress Notes (Signed)
Evusheld placed, orders missing and patient is due per val dodd.  Wilber Bihari, NP

## 2020-11-06 NOTE — Progress Notes (Signed)
Pt observed for 30 minutes post Evushield injection. Provided pt with information on Evushield. Tolerated injection well.  VSS at discharge. Ambulatory to lobby.

## 2020-11-13 ENCOUNTER — Telehealth: Payer: Self-pay

## 2020-11-13 NOTE — Telephone Encounter (Signed)
Pt called stating that [facility] called and informed her that her Elestrin gel is no longer available. Pt would like to know if there is something else she can be prescribed in place of this medication. Pt is currently being followed by hematology and oncology. Please advise.

## 2020-11-14 ENCOUNTER — Other Ambulatory Visit: Payer: Self-pay | Admitting: Obstetrics

## 2020-11-14 DIAGNOSIS — N951 Menopausal and female climacteric states: Secondary | ICD-10-CM

## 2020-11-14 MED ORDER — DIVIGEL 0.5 MG/0.5GM TD GEL: TRANSDERMAL | 4 refills | Status: DC

## 2020-11-14 NOTE — Telephone Encounter (Signed)
Pt notified that new Rx has been sent to [facility]. Pt verbalized understanding.

## 2020-11-19 ENCOUNTER — Other Ambulatory Visit: Payer: Self-pay | Admitting: Adult Health

## 2020-11-19 ENCOUNTER — Other Ambulatory Visit: Payer: Self-pay | Admitting: *Deleted

## 2020-11-19 DIAGNOSIS — U071 COVID-19: Secondary | ICD-10-CM

## 2020-11-19 NOTE — Progress Notes (Signed)
  This patient is a 70 y.o. female that meets the FDA criteria for Emergency Use Authorization of COVID monoclonal antibody bebtelovimab. Has a (+) direct SARS-CoV-2 viral test result Has mild or moderate COVID-19  Is NOT hospitalized due to COVID-19 Is within 10 days of symptom onset Has at least one of the high risk factor(s) for progression to severe COVID-19 and/or hospitalization as defined in EUA. Specific high risk criteria : Older age (>/= 72 yo) and Immunosuppressive Disease or Treatment   I have spoken and communicated the following to the patient or parent/caregiver regarding COVID monoclonal antibody treatment:  FDA has authorized the emergency use for the treatment of mild to moderate COVID-19 in adults and pediatric patients with positive results of direct SARS-CoV-2 viral testing who are 31 years of age and older weighing at least 40 kg, and who are at high risk for progressing to severe COVID-19 and/or hospitalization.  The significant known and potential risks and benefits of COVID monoclonal antibody, and the extent to which such potential risks and benefits are unknown.  Information on available alternative treatments and the risks and benefits of those alternatives, including clinical trials.  Patients treated with COVID monoclonal antibody should continue to self-isolate and use infection control measures (e.g., wear mask, isolate, social distance, avoid sharing personal items, clean and disinfect "high touch" surfaces, and frequent handwashing) according to CDC guidelines.   The patient or parent/caregiver has the option to accept or refuse COVID monoclonal antibody treatment.  Discussion about the monoclonal antibody infusion does not ensure treatment. The patient will be placed on a list and scheduled according to risk, symptom onset and availability. A scheduler will reach to the patient to let them know if we can accommodate their infusion or not.  After reviewing  this information with the patient, the patient has agreed to receive one of the available covid 19 monoclonal antibodies and will be provided an appropriate fact sheet prior to infusion. Scot Dock, NP 11/19/2020 3:35 PM

## 2020-11-20 ENCOUNTER — Ambulatory Visit (INDEPENDENT_AMBULATORY_CARE_PROVIDER_SITE_OTHER): Payer: Medicare Other

## 2020-11-20 ENCOUNTER — Telehealth: Payer: Self-pay

## 2020-11-20 DIAGNOSIS — U071 COVID-19: Secondary | ICD-10-CM

## 2020-11-20 MED ORDER — FAMOTIDINE IN NACL 20-0.9 MG/50ML-% IV SOLN: 20.0000 mg | Freq: Once | INTRAVENOUS | Status: AC | PRN

## 2020-11-20 MED ORDER — METHYLPREDNISOLONE SODIUM SUCC 125 MG IJ SOLR: 125.0000 mg | Freq: Once | INTRAMUSCULAR | Status: AC | PRN

## 2020-11-20 MED ORDER — SODIUM CHLORIDE 0.9 % IV SOLN: INTRAVENOUS | Status: AC | PRN

## 2020-11-20 MED ORDER — DIPHENHYDRAMINE HCL 50 MG/ML IJ SOLN: 50.0000 mg | Freq: Once | INTRAMUSCULAR | Status: AC | PRN

## 2020-11-20 MED ORDER — ALBUTEROL SULFATE HFA 108 (90 BASE) MCG/ACT IN AERS: 2.0000 | INHALATION_SPRAY | Freq: Once | RESPIRATORY_TRACT | Status: AC | PRN

## 2020-11-20 MED ORDER — EPINEPHRINE 0.3 MG/0.3ML IJ SOAJ: 0.3000 mg | Freq: Once | INTRAMUSCULAR | Status: AC | PRN

## 2020-11-20 MED ORDER — BEBTELOVIMAB 175 MG/2 ML IV (EUA)
175.0000 mg | Freq: Once | INTRAMUSCULAR | Status: AC
  Administered 2020-11-20: 175 mg via INTRAVENOUS

## 2020-11-20 NOTE — Telephone Encounter (Signed)
Called to schedule Bebtelovimab infusion. No answer. Left a voicemail for patient to return call.

## 2020-11-20 NOTE — Progress Notes (Signed)
Diagnosis: COVID  Provider:  Marshell Garfinkel, MD  Procedure: Infusion  IV Type: Peripheral, IV Location: L Hand  Bebtelovimab, Dose: 175 mg  Infusion Start Time: 1694  Infusion Stop Time: 5038  Post Infusion IV Care: Observation period completed and Peripheral IV Discontinued  Discharge: Condition: Good, Destination: Home . AVS provided to patient.   Performed by:  Arnoldo Morale, RN

## 2020-11-20 NOTE — Patient Instructions (Signed)
Patient was given drug information sheet for Bebtelovimab.  Also given cost estimate sheet for Bebtelovimab.  Patient reviewed documentation and questions were answered.  Patient would like to proceed with treatment at this time.   

## 2020-11-22 ENCOUNTER — Other Ambulatory Visit (HOSPITAL_COMMUNITY): Payer: Self-pay

## 2020-11-28 ENCOUNTER — Other Ambulatory Visit: Payer: Self-pay | Admitting: Oncology

## 2020-11-28 ENCOUNTER — Other Ambulatory Visit (HOSPITAL_COMMUNITY): Payer: Self-pay

## 2020-11-28 DIAGNOSIS — C9110 Chronic lymphocytic leukemia of B-cell type not having achieved remission: Secondary | ICD-10-CM

## 2020-11-28 DIAGNOSIS — J40 Bronchitis, not specified as acute or chronic: Secondary | ICD-10-CM | POA: Diagnosis not present

## 2020-11-28 DIAGNOSIS — U071 COVID-19: Secondary | ICD-10-CM | POA: Diagnosis not present

## 2020-11-28 DIAGNOSIS — R051 Acute cough: Secondary | ICD-10-CM | POA: Diagnosis not present

## 2020-11-28 DIAGNOSIS — A0472 Enterocolitis due to Clostridium difficile, not specified as recurrent: Secondary | ICD-10-CM | POA: Diagnosis not present

## 2020-11-28 MED ORDER — VENETOCLAX 100 MG PO TABS
ORAL_TABLET | ORAL | 6 refills | Status: DC
  Filled 2020-11-28: qty 30, 30d supply, fill #0

## 2020-11-29 ENCOUNTER — Other Ambulatory Visit (HOSPITAL_COMMUNITY): Payer: Self-pay

## 2020-11-30 ENCOUNTER — Other Ambulatory Visit (HOSPITAL_COMMUNITY): Payer: Self-pay

## 2020-12-03 ENCOUNTER — Other Ambulatory Visit: Payer: Self-pay | Admitting: Obstetrics

## 2020-12-03 DIAGNOSIS — F5101 Primary insomnia: Secondary | ICD-10-CM

## 2020-12-04 ENCOUNTER — Other Ambulatory Visit: Payer: Self-pay | Admitting: *Deleted

## 2020-12-04 DIAGNOSIS — C9110 Chronic lymphocytic leukemia of B-cell type not having achieved remission: Secondary | ICD-10-CM

## 2020-12-04 MED ORDER — VENETOCLAX 100 MG PO TABS: ORAL_TABLET | ORAL | 6 refills | Status: DC

## 2020-12-06 DIAGNOSIS — H2512 Age-related nuclear cataract, left eye: Secondary | ICD-10-CM | POA: Diagnosis not present

## 2020-12-07 DIAGNOSIS — H25011 Cortical age-related cataract, right eye: Secondary | ICD-10-CM | POA: Diagnosis not present

## 2020-12-07 DIAGNOSIS — H2511 Age-related nuclear cataract, right eye: Secondary | ICD-10-CM | POA: Diagnosis not present

## 2020-12-07 DIAGNOSIS — H25041 Posterior subcapsular polar age-related cataract, right eye: Secondary | ICD-10-CM | POA: Diagnosis not present

## 2020-12-14 ENCOUNTER — Telehealth: Payer: Self-pay | Admitting: *Deleted

## 2020-12-14 ENCOUNTER — Other Ambulatory Visit: Payer: Self-pay | Admitting: *Deleted

## 2020-12-14 ENCOUNTER — Other Ambulatory Visit: Payer: Self-pay | Admitting: Oncology

## 2020-12-14 NOTE — Telephone Encounter (Signed)
Rachel [name] called regarding a refill of Acyclovir. States she has run out. RN asked her about Valcyclovir that was prescribed in August. States she never picked it up. Acyclovir is not on her active med list. Valcyclovir refill sent to [facility] today  Please advise as to which medication she should be taking.

## 2020-12-18 ENCOUNTER — Encounter: Payer: Self-pay | Admitting: Oncology

## 2020-12-18 NOTE — Telephone Encounter (Signed)
No entry 

## 2020-12-18 NOTE — Telephone Encounter (Signed)
See other note

## 2020-12-20 DIAGNOSIS — H2511 Age-related nuclear cataract, right eye: Secondary | ICD-10-CM | POA: Diagnosis not present

## 2020-12-25 DIAGNOSIS — F3341 Major depressive disorder, recurrent, in partial remission: Secondary | ICD-10-CM | POA: Diagnosis not present

## 2020-12-25 DIAGNOSIS — N183 Chronic kidney disease, stage 3 unspecified: Secondary | ICD-10-CM | POA: Diagnosis not present

## 2020-12-25 DIAGNOSIS — A0472 Enterocolitis due to Clostridium difficile, not specified as recurrent: Secondary | ICD-10-CM | POA: Diagnosis not present

## 2020-12-25 DIAGNOSIS — Z23 Encounter for immunization: Secondary | ICD-10-CM | POA: Diagnosis not present

## 2020-12-25 DIAGNOSIS — C9110 Chronic lymphocytic leukemia of B-cell type not having achieved remission: Secondary | ICD-10-CM | POA: Diagnosis not present

## 2021-01-13 NOTE — Progress Notes (Signed)
[place]  Telephone:(336) [phone] Fax:(336) [phone]     ID: Rachel [name]   DOB: 1950-04-09  MR#: [id number]  GUR#:[id number]  Patient Care Team: Leeroy Cha, MD as PCP - General (Internal Medicine) Henryetta Corriveau, Virgie Dad, MD as Consulting Physician (Oncology) [name], MD as Consulting Physician (Obstetrics and Gynecology) Vallarie Mare, MD as Consulting Physician (Neurosurgery) OTHER MD:  CHIEF COMPLAINT: Chronic lymphocytic leukemia  CURRENT TREATMENT: venetoclax, ibrutinib; evusheld   INTERVAL HISTORY: Rachel [name] returns today for follow-up of her chronic lymphocytic leukemia/small-lymphocytic lymphoma.  She remains very stable on her medications  She take venetoclax, at 100 mg a day.    She has no symptoms associated with it that she is aware of.  She obtains it at $34 for a 1 month supply  She also continues on ibrutinib at 280 mg daily.  She has no side effects from it other than minimal bruising.  She tells me whenever she stops taking this drug her warts come back.  She wants to continue it indefinitely.  She obtains it at $34 for 31-month supply  Since her last visit, she underwent bilateral screening mammography with tomography at [facility] on 10/19/2020 showing: breast density category C; no evidence of malignancy in either breast.    REVIEW OF SYSTEMS: Rachel [name] is very concerned about her older son and we discussed that at length today.  The other children are doing very well.  She had bilateral cataract surgery last week and that went well.  She is not exercising as much as she would like because she says the gyms in [place] do not look clean to her.  She is concerned that she has gained some weight.  She has no "B" symptoms.  A detailed review of systems today was otherwise stable.   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 She has been treated with Rituxan, ofatumumab, cladribine, bendamustine and currently with ibrutinib, with details summarized below.   PAST MEDICAL HISTORY: 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   Diverticulitis    Leukemia ([place])    Lower back pain     PAST SURGICAL HISTORY: Past Surgical History:  Procedure Laterality Date   BREAST SURGERY  2015   left breast bx   BUNIONECTOMY     right foot   lining of uterus removed     OTHER SURGICAL HISTORY     removal of uterine lining   TONSILLECTOMY      FAMILY HISTORY Family History  Problem Relation Age of Onset   Depression Son   The patient's mother will turn 48 on March 2021   GYNECOLOGIC HISTORY: Menarche age 37, first live birth age 45, she is [place] P3. She stopped having periods in 1999 after endometrial stripping. She continues on hormone replacement.   SOCIAL HISTORY: [name] is retired from [employer].  The patient and her husband are now living in [place].  Her son [name] lives in [place]; he works for a [employer]. A second son lives in [place] and has two children. Son [name] lives in [place]. She has 8 grandchildren, the oldest is 6 and the youngest is 58.  ADVANCED DIRECTIVES: In place   HEALTH MAINTENANCE: Social History   Tobacco Use   Smoking  status: Never   Smokeless tobacco: Never  Vaping Use   Vaping Use: Never used  Substance Use Topics   Alcohol use: Yes    Alcohol/week: 0.0 standard drinks    Comment: 2 shots a day   Drug use: No     Colonoscopy: 12/2018 (Dr. Collene Mares), repeat in 10 years  PAP: December 2012/ [name]  Bone density: Due  Lipid panel: per Dr Drema Dallas  Allergies  Allergen Reactions   Penicillins Anaphylaxis    "stopped breathing" "drops my heartbeat" "I just pass out"    Lidocaine-Tetracaine-Epineph Other (See Comments)    Pt came in for CODE STROKE after receiving Lidocaine with Epi in dental office. No stroke, but had symptoms.     Current Outpatient Medications  Medication Sig Dispense Refill   acetaminophen (TYLENOL) 500 MG tablet Take 500-1,000 mg by mouth every 6 (six) hours as needed for headache.      albuterol (VENTOLIN HFA) 108 (90 Base) MCG/ACT inhaler Inhale 1-2 puffs into the lungs every 6 (six) hours as needed for shortness of breath or wheezing.     allopurinol (ZYLOPRIM) 300 MG tablet TAKE 1 TABLET DAILY 90 tablet 3   azelastine (ASTELIN) 0.1 % nasal spray Place into both nostrils 2 (two) times daily. Use in each nostril as directed     betamethasone dipropionate (DIPROLENE) 0.05 % ointment Apply topically 2 (two) times daily.     Calcium Citrate-Vitamin D 200-250 MG-UNIT TABS Take 1 tablet by mouth 4 (four) times daily.     Cholecalciferol (VITAMIN D-3) 125 MCG (5000 UT) TABS Take 2 tablets by mouth daily.     Estradiol (DIVIGEL) 0.5 MG/0.5GM GEL Thinly apply contents of one complete foil packet to left or right upper thigh on alternating days.  Allow to dry completely before dressing.  Wash both hands thoroughly after application. 3 each 4   famotidine (PEPCID) 20 MG tablet Take 20 mg by mouth at bedtime.  (Patient not taking: Reported on 05/14/2020)     IMBRUVICA 280 MG TABS TAKE 1 TABLET DAILY 56 tablet 5   lidocaine-prilocaine (EMLA) cream Apply a nickel size amount to skin on top of port- cover with saran wrap at least 1 hour before access 30 g 0   Magnesium 200 MG TABS Take 2 tablets by mouth daily.     methocarbamol (ROBAXIN) 500 MG tablet Take 2 tablets (1,000 mg total) by mouth every 8 (eight) hours as needed for muscle spasms. (Patient not taking: Reported on 05/14/2020) 60 tablet 0   montelukast (SINGULAIR) 10 MG tablet Take 10 mg by mouth at bedtime.     progesterone (PROMETRIUM) 200 MG capsule Take 1 capsule (200 mg total) by mouth at  bedtime. 90 capsule 3   traZODone (DESYREL) 100 MG tablet TAKE 1 TABLET AT BEDTIME 90 tablet 3   valACYclovir (VALTREX) 1000 MG tablet TAKE 1 TABLET BY MOUTH TWICE DAILY FOR 5 DAYS THEN TAKE 1 TABLET BY MOUTH DAILY 80 tablet 6   venetoclax (VENCLEXTA) 100 MG tablet TAKE 1 TABLET (100 MG) BY MOUTH DAILY. TAKE WITH FOOD AND WATER AT APPROXIMATELY THE SAME TIME EACH DAY. 30 tablet 6   Zinc 30 MG TABS Take 1 tablet by mouth daily.     Current Facility-Administered Medications  Medication Dose Route Frequency Provider Last Rate Last Admin   0.9 %  sodium chloride infusion   Intravenous PRN Causey, Charlestine Massed, NP        OBJECTIVE: white woman in no acute distress  Vitals:   01/14/21 1305  BP: 118/66  Pulse: 81  Resp: 18  Temp: 97.9 F (36.6 C)  SpO2: 100%   Wt Readings from Last 3 Encounters:  01/14/21 122 lb 11.2 oz (55.7 kg)  09/26/20 124 lb 12.8 oz (56.6 kg)  05/14/20 125 lb 6.4 oz (56.9 kg)   Body mass index is 26.09 kg/m.    ECOG FS:1 - Symptomatic but completely ambulatory  Sclerae unicteric, EOMs intact Wearing a mask No cervical or supraclavicular adenopathy, no axillary adenopathy Lungs no rales or rhonchi Heart regular rate and rhythm Abd soft, nontender, positive bowel sounds MSK no focal spinal tenderness, no upper extremity lymphedema Neuro: nonfocal, well oriented, appropriate affect Breasts: Deferred   LAB RESULTS:  Lab Results  Component Value Date   WBC 6.1 01/14/2021   NEUTROABS 4.2 01/14/2021   HGB 15.4 (H) 01/14/2021   HCT 44.7 01/14/2021   MCV 90.3 01/14/2021   PLT 120 (L) 01/14/2021      Chemistry      Component Value Date/Time   NA 139 01/14/2021 1247   NA 140 12/31/2016 1406   K 4.0 01/14/2021 1247   K 3.6 12/31/2016 1406   CL 106 01/14/2021 1247   CL 101 12/15/2011 1322   CO2 23 01/14/2021 1247   CO2 24 12/31/2016 1406   BUN 13 01/14/2021 1247   BUN 12.8 12/31/2016 1406   CREATININE 0.83 01/14/2021 1247   CREATININE 0.9  12/31/2016 1406      Component Value Date/Time   CALCIUM 9.1 01/14/2021 1247   CALCIUM 8.8 12/31/2016 1406   ALKPHOS 94 01/14/2021 1247   ALKPHOS 71 12/31/2016 1406   AST 34 01/14/2021 1247   AST 18 12/31/2016 1406   ALT 28 01/14/2021 1247   ALT 11 12/31/2016 1406   BILITOT 0.6 01/14/2021 1247   BILITOT 0.49 12/31/2016 1406      STUDIES: No results found.   ASSESSMENT: 70 y.o.  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 rigors 02/27/2014: received IV vancomycin and levaquin briefly, then levaquin po (pcn allergy)  (6) bendamustine/ rituximab for 6 cycles, completed 05/04/2014, with improvement but not normalization of the absolute lymphocyte count  (7) ibrutinib started 05/08/2014, currently at 280 mg daily. Stopped by patient in June, 2019 restarted in September, 2019, discontinued October 2020  (a) hepatitis B studies 03/06/2014 and 12/31/2016, negative  (b) ibrutinib restarted 01/06/2019 (at 280 mg daily) in conjunction with venetoclax  (c) ibrutinib held after 10/18/2019, now used as needed for wart control  (8) CT abd/pelvis 10/22/2018 shows retroperitoneal adenopathy, increased splenomegaly  (a) repeat CT scan of the neck chest abdomen and pelvis 07/02/2019, with resolution of the previously noted adenopathy and splenomegaly.  (9)  started venetoclax 12/15/2018  (a) uneventful ramp-up to the 100 mg a day dose, then held at that dose due to symptoms  (b) repeat hepatitis panel 10/18/2019 again negative  (10) evusheld started march 2022,  repeated Sept 2022   PLAN: Rachel [name] is now a little over 20 years out from definitive diagnosis of chronic lymphoid leukemia.  Her disease is very well controlled on venetoclax and ibrutinib and I am making no changes in those treatments.  She understands I am retiring and I offered to refer her to one of my partners in [place] but she is very embedded in this office and wishes to follow-up with Dr. Chryl Heck who will be working in conjunction with my nurse Hinda Lenis.  She understands that she is profoundly immunocompromised.  She did well with COVID and I encouraged her to go ahead and get the vaccines as they come in.  Nevertheless she will need evusheld so long as it appears that those injections are helpful (if the [place] 2 virus continues to mutated they might not be).  Accordingly I scheduling her for a visit labs and her next evusheld dose in February 2023.  She knows to call us for any other issue that may develop before the next visit.  Total encounter time 20 minutes.Sarajane Jews C. Jerico Grisso, MD 01/14/21 2:12 PM Medical Oncology and Hematology [facility], [place] [address] Tel. [phone]    Fax. [phone]   I, Wilburn Mylar, am acting as scribe for Dr. Virgie Dad. Chany Woolworth.  I, Lurline Del MD, have reviewed the above documentation for accuracy and completeness, and I agree with the above.   *Total Encounter Time as defined by the Centers for Medicare and Medicaid Services includes, in addition to the face-to-face time of a patient visit (documented in the note above) non-face-to-face time: obtaining and reviewing outside history, ordering and reviewing medications, tests or procedures, care coordination (communications with  other health care professionals or caregivers) and documentation in the medical record.

## 2021-01-14 ENCOUNTER — Other Ambulatory Visit: Payer: Self-pay

## 2021-01-14 ENCOUNTER — Inpatient Hospital Stay: Payer: Medicare Other | Attending: Oncology | Admitting: Oncology

## 2021-01-14 ENCOUNTER — Inpatient Hospital Stay: Payer: Medicare Other

## 2021-01-14 ENCOUNTER — Other Ambulatory Visit: Payer: Self-pay | Admitting: *Deleted

## 2021-01-14 VITALS — BP 118/66 | HR 81 | Temp 97.9°F | Resp 18 | Ht <= 58 in | Wt 122.7 lb

## 2021-01-14 DIAGNOSIS — Z923 Personal history of irradiation: Secondary | ICD-10-CM | POA: Insufficient documentation

## 2021-01-14 DIAGNOSIS — Z9221 Personal history of antineoplastic chemotherapy: Secondary | ICD-10-CM | POA: Diagnosis not present

## 2021-01-14 DIAGNOSIS — C9110 Chronic lymphocytic leukemia of B-cell type not having achieved remission: Secondary | ICD-10-CM

## 2021-01-14 DIAGNOSIS — Z8572 Personal history of non-Hodgkin lymphomas: Secondary | ICD-10-CM | POA: Insufficient documentation

## 2021-01-14 DIAGNOSIS — Z95828 Presence of other vascular implants and grafts: Secondary | ICD-10-CM

## 2021-01-14 DIAGNOSIS — D649 Anemia, unspecified: Secondary | ICD-10-CM | POA: Insufficient documentation

## 2021-01-14 DIAGNOSIS — C8300 Small cell B-cell lymphoma, unspecified site: Secondary | ICD-10-CM

## 2021-01-14 LAB — CBC WITH DIFFERENTIAL (CANCER CENTER ONLY)
Abs Immature Granulocytes: 0.06 10*3/uL (ref 0.00–0.07)
Basophils Absolute: 0 10*3/uL (ref 0.0–0.1)
Basophils Relative: 1 %
Eosinophils Absolute: 0.1 10*3/uL (ref 0.0–0.5)
Eosinophils Relative: 1 %
HCT: 44.7 % (ref 36.0–46.0)
Hemoglobin: 15.4 g/dL — ABNORMAL HIGH (ref 12.0–15.0)
Immature Granulocytes: 1 %
Lymphocytes Relative: 19 %
Lymphs Abs: 1.2 10*3/uL (ref 0.7–4.0)
MCH: 31.1 pg (ref 26.0–34.0)
MCHC: 34.5 g/dL (ref 30.0–36.0)
MCV: 90.3 fL (ref 80.0–100.0)
Monocytes Absolute: 0.5 10*3/uL (ref 0.1–1.0)
Monocytes Relative: 9 %
Neutro Abs: 4.2 10*3/uL (ref 1.7–7.7)
Neutrophils Relative %: 69 %
Platelet Count: 120 10*3/uL — ABNORMAL LOW (ref 150–400)
RBC: 4.95 MIL/uL (ref 3.87–5.11)
RDW: 13.7 % (ref 11.5–15.5)
WBC Count: 6.1 10*3/uL (ref 4.0–10.5)
nRBC: 0 % (ref 0.0–0.2)

## 2021-01-14 LAB — CMP (CANCER CENTER ONLY)
ALT: 28 U/L (ref 0–44)
AST: 34 U/L (ref 15–41)
Albumin: 4.1 g/dL (ref 3.5–5.0)
Alkaline Phosphatase: 94 U/L (ref 38–126)
Anion gap: 10 (ref 5–15)
BUN: 13 mg/dL (ref 8–23)
CO2: 23 mmol/L (ref 22–32)
Calcium: 9.1 mg/dL (ref 8.9–10.3)
Chloride: 106 mmol/L (ref 98–111)
Creatinine: 0.83 mg/dL (ref 0.44–1.00)
GFR, Estimated: >60 mL/min (ref >60)
Glucose, Bld: 95 mg/dL (ref 70–99)
Potassium: 4 mmol/L (ref 3.5–5.1)
Sodium: 139 mmol/L (ref 135–145)
Total Bilirubin: 0.6 mg/dL (ref 0.3–1.2)
Total Protein: 6.6 g/dL (ref 6.5–8.1)

## 2021-01-14 LAB — LACTATE DEHYDROGENASE: LDH: 225 U/L — ABNORMAL HIGH (ref 98–192)

## 2021-01-14 LAB — MAGNESIUM: Magnesium: 1.9 mg/dL (ref 1.7–2.4)

## 2021-01-14 MED ORDER — HEPARIN SOD (PORK) LOCK FLUSH 100 UNIT/ML IV SOLN
500.0000 [IU] | Freq: Once | INTRAVENOUS | Status: AC
  Administered 2021-01-14: 500 [IU]

## 2021-01-14 MED ORDER — SODIUM CHLORIDE 0.9% FLUSH
10.0000 mL | Freq: Once | INTRAVENOUS | Status: AC
Start: 2021-01-14 — End: 2021-01-14
  Administered 2021-01-14: 10 mL

## 2021-01-15 LAB — BETA 2 MICROGLOBULIN, SERUM: Beta-2 Microglobulin: 2.8 mg/L — ABNORMAL HIGH (ref 0.6–2.4)

## 2021-01-31 DIAGNOSIS — H01009 Unspecified blepharitis unspecified eye, unspecified eyelid: Secondary | ICD-10-CM | POA: Diagnosis not present

## 2021-02-23 DIAGNOSIS — J00 Acute nasopharyngitis [common cold]: Secondary | ICD-10-CM | POA: Diagnosis not present

## 2021-03-04 DIAGNOSIS — J309 Allergic rhinitis, unspecified: Secondary | ICD-10-CM | POA: Diagnosis not present

## 2021-03-04 DIAGNOSIS — J0110 Acute frontal sinusitis, unspecified: Secondary | ICD-10-CM | POA: Diagnosis not present

## 2021-03-26 ENCOUNTER — Other Ambulatory Visit: Payer: Self-pay | Admitting: *Deleted

## 2021-03-26 DIAGNOSIS — F411 Generalized anxiety disorder: Secondary | ICD-10-CM | POA: Diagnosis not present

## 2021-03-26 MED ORDER — ALLOPURINOL 300 MG PO TABS: 300.0000 mg | ORAL_TABLET | Freq: Every day | ORAL | 3 refills | Status: DC

## 2021-04-09 ENCOUNTER — Telehealth: Payer: Self-pay | Admitting: *Deleted

## 2021-04-09 ENCOUNTER — Other Ambulatory Visit: Payer: Self-pay | Admitting: *Deleted

## 2021-04-09 DIAGNOSIS — C9110 Chronic lymphocytic leukemia of B-cell type not having achieved remission: Secondary | ICD-10-CM

## 2021-04-09 NOTE — Telephone Encounter (Signed)
Contacted patient to inform that  Rachel [name], [facility] contacted [facility] was confirmed delivery for Venetoclax and Imbruvica  will be tomorrow. Patient expressed thanks  Per Dr. Chryl Heck: patient can restart Imbruvica when she gets it. She should come to [place] on Friday for labs, She is to hold Venetoclax until after she has labs on Friday since she has been off Venetoclax. She can resume 100 mg dose daily Venetoclax Friday unless directed otherwise following labs.  Patient made aware of all directions from Dr. Chryl Heck and verbalized understanding. Schedule message sent to schedule for port flush/labs.

## 2021-04-09 NOTE — Telephone Encounter (Signed)
Contacted patient r/t message about difficulty receiving medication deliveries from [place] for [place] and request to speak with pharmacist about possible side effects from not taking meds as she is currently out of them.  Patient reports she has not taken Venclexta for 6 days and today has run out of [place]. She said in past that when off Imbruvica for more than 5 days, she develops warts on fingers and toes. [name] said she was informed by [facility] that they may be able to overnight her medications, but it ould also be next week. Advised her that this Probation officer will contact pharmacist with request. Contacted Drema Halon, [facility] with [place] with request to assist patient. Ms. Pamala Hurry stated she will contact [facility] on patient behalf and then contact patient.

## 2021-04-12 ENCOUNTER — Other Ambulatory Visit: Payer: Self-pay | Admitting: *Deleted

## 2021-04-12 ENCOUNTER — Other Ambulatory Visit: Payer: Self-pay

## 2021-04-12 ENCOUNTER — Inpatient Hospital Stay: Payer: Medicare Other | Attending: Hematology and Oncology

## 2021-04-12 DIAGNOSIS — Z8572 Personal history of non-Hodgkin lymphomas: Secondary | ICD-10-CM | POA: Diagnosis not present

## 2021-04-12 DIAGNOSIS — C9110 Chronic lymphocytic leukemia of B-cell type not having achieved remission: Secondary | ICD-10-CM | POA: Diagnosis not present

## 2021-04-12 DIAGNOSIS — Z95828 Presence of other vascular implants and grafts: Secondary | ICD-10-CM

## 2021-04-12 LAB — CBC WITH DIFFERENTIAL (CANCER CENTER ONLY)
Abs Immature Granulocytes: 0.09 10*3/uL — ABNORMAL HIGH (ref 0.00–0.07)
Basophils Absolute: 0 10*3/uL (ref 0.0–0.1)
Basophils Relative: 1 %
Eosinophils Absolute: 0.1 10*3/uL (ref 0.0–0.5)
Eosinophils Relative: 2 %
HCT: 42.5 % (ref 36.0–46.0)
Hemoglobin: 14.2 g/dL (ref 12.0–15.0)
Immature Granulocytes: 2 %
Lymphocytes Relative: 19 %
Lymphs Abs: 0.8 10*3/uL (ref 0.7–4.0)
MCH: 31.6 pg (ref 26.0–34.0)
MCHC: 33.4 g/dL (ref 30.0–36.0)
MCV: 94.7 fL (ref 80.0–100.0)
Monocytes Absolute: 0.4 10*3/uL (ref 0.1–1.0)
Monocytes Relative: 9 %
Neutro Abs: 3 10*3/uL (ref 1.7–7.7)
Neutrophils Relative %: 67 %
Platelet Count: 112 10*3/uL — ABNORMAL LOW (ref 150–400)
RBC: 4.49 MIL/uL (ref 3.87–5.11)
RDW: 12.3 % (ref 11.5–15.5)
WBC Count: 4.5 10*3/uL (ref 4.0–10.5)
nRBC: 0 % (ref 0.0–0.2)

## 2021-04-12 LAB — CMP (CANCER CENTER ONLY)
ALT: 29 U/L (ref 0–44)
AST: 28 U/L (ref 15–41)
Albumin: 4 g/dL (ref 3.5–5.0)
Alkaline Phosphatase: 76 U/L (ref 38–126)
Anion gap: 6 (ref 5–15)
BUN: 13 mg/dL (ref 8–23)
CO2: 28 mmol/L (ref 22–32)
Calcium: 9.1 mg/dL (ref 8.9–10.3)
Chloride: 109 mmol/L (ref 98–111)
Creatinine: 0.92 mg/dL (ref 0.44–1.00)
GFR, Estimated: >60 mL/min (ref >60)
Glucose, Bld: 132 mg/dL — ABNORMAL HIGH (ref 70–99)
Potassium: 3.4 mmol/L — ABNORMAL LOW (ref 3.5–5.1)
Sodium: 143 mmol/L (ref 135–145)
Total Bilirubin: 0.6 mg/dL (ref 0.3–1.2)
Total Protein: 5.8 g/dL — ABNORMAL LOW (ref 6.5–8.1)

## 2021-04-12 LAB — URIC ACID: Uric Acid, Serum: 4.7 mg/dL (ref 2.5–7.1)

## 2021-04-12 LAB — PHOSPHORUS: Phosphorus: 3.1 mg/dL (ref 2.5–4.6)

## 2021-04-12 LAB — LACTATE DEHYDROGENASE: LDH: 198 U/L — ABNORMAL HIGH (ref 98–192)

## 2021-04-12 LAB — MAGNESIUM: Magnesium: 1.9 mg/dL (ref 1.7–2.4)

## 2021-04-12 MED ORDER — HEPARIN SOD (PORK) LOCK FLUSH 100 UNIT/ML IV SOLN
500.0000 [IU] | Freq: Once | INTRAVENOUS | Status: AC
  Administered 2021-04-12: 500 [IU]

## 2021-04-12 MED ORDER — SODIUM CHLORIDE 0.9% FLUSH
10.0000 mL | Freq: Once | INTRAVENOUS | Status: AC
  Administered 2021-04-12: 10 mL

## 2021-04-16 NOTE — Progress Notes (Signed)
[place]  Telephone:(336) [phone] Fax:(336) [phone]     ID: Rachel [name]   DOB: Oct 17, 1950  MR#: [id number]  MBW#:[id number]  Patient Care Team: Leeroy Cha, MD as PCP - General (Internal Medicine) Magrinat, Rachel Dad, MD (Inactive) as Consulting Physician (Oncology) Rachel Bombard, MD as Consulting Physician (Obstetrics and Gynecology) Rachel Mare, MD as Consulting Physician (Neurosurgery) OTHER MD:  CHIEF COMPLAINT: Chronic lymphocytic leukemia  CURRENT TREATMENT: venetoclax, ibrutinib; evusheld  INTERVAL HISTORY:  Rachel [name] returns today for follow-up of her chronic lymphocytic leukemia/small-lymphocytic lymphoma.  She remains very stable on her medications She has been taking ibrutinib and venetoclax daily as instructed. She reports some diarrhea,poops 5/7 times a day, takes imodium as needed. She also notices some dry mouth from the medication. No B symptoms. She denies any changes in breathing, bowel habits or urinary habits. She uses her inhaler as needed when she is anxious. She is understandably worried about some ongoing family issues  She underwent bilateral screening mammography with tomography at [facility] on 10/19/2020 showing: breast density category C; no evidence of malignancy in either breast.    REVIEW OF SYSTEMS:   She has no "B" symptoms.  A detailed review of systems today was otherwise stable.   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 She has been treated with Rituxan, ofatumumab, cladribine, bendamustine and currently with ibrutinib, with details summarized below.   PAST MEDICAL HISTORY: 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   Diverticulitis     Leukemia ([place])    Lower back pain     PAST SURGICAL HISTORY: Past Surgical History:  Procedure Laterality Date   BREAST SURGERY  2015   left breast bx   BUNIONECTOMY     right foot   lining of uterus removed     OTHER SURGICAL HISTORY     removal of uterine lining   TONSILLECTOMY      FAMILY HISTORY Family History  Problem Relation Age of Onset   Depression Son   The patient's mother will turn 64 on March 2021   GYNECOLOGIC HISTORY: Menarche age 50, first live birth age 1, she is [place] P3. She stopped having periods in 1999 after endometrial stripping. She continues on hormone replacement.   SOCIAL HISTORY: Her husband Rachel [name] is retired from [employer].  The patient and her husband are now living in [place].  Her son Rachel [name] lives in [place]; he works for a [employer]. A second son lives in [place] and has two children. Son Rachel [name] lives in [place]. She has 8 grandchildren, the oldest is 31 and the youngest is 48.  ADVANCED DIRECTIVES: In place   HEALTH MAINTENANCE: Social History   Tobacco Use   Smoking status: Never   Smokeless tobacco: Never  Vaping Use   Vaping Use: Never used  Substance Use Topics   Alcohol use: Yes    Alcohol/week: 0.0 standard drinks    Comment: 2 shots a day   Drug use: No     Colonoscopy: 12/2018 (Dr. Collene Mares), repeat in 10 years  PAP: December 2012/ [facility]  Bone density: Due  Lipid panel: per Dr Drema Dallas  Allergies  Allergen Reactions   Penicillins Anaphylaxis    "stopped breathing" "drops my heartbeat" "I just pass out"  Lidocaine-Tetracaine-Epineph Other (See Comments)    Pt came in for CODE STROKE after receiving Lidocaine with Epi in dental office. No stroke, but had symptoms.     Current Outpatient Medications  Medication Sig Dispense Refill   acetaminophen (TYLENOL) 500 MG tablet Take 500-1,000 mg by mouth every 6 (six) hours as needed for headache.      albuterol (VENTOLIN HFA) 108 (90 Base) MCG/ACT inhaler  Inhale 1-2 puffs into the lungs every 6 (six) hours as needed for shortness of breath or wheezing.     allopurinol (ZYLOPRIM) 300 MG tablet Take 1 tablet (300 mg total) by mouth daily. 90 tablet 3   azelastine (ASTELIN) 0.1 % nasal spray Place into both nostrils 2 (two) times daily. Use in each nostril as directed     betamethasone dipropionate (DIPROLENE) 0.05 % ointment Apply topically 2 (two) times daily.     Calcium Citrate-Vitamin D 200-250 MG-UNIT TABS Take 1 tablet by mouth 4 (four) times daily.     Cholecalciferol (VITAMIN D-3) 125 MCG (5000 UT) TABS Take 2 tablets by mouth daily.     Estradiol (DIVIGEL) 0.5 MG/0.5GM GEL Thinly apply contents of one complete foil packet to left or right upper thigh on alternating days.  Allow to dry completely before dressing.  Wash both hands thoroughly after application. 3 each 4   famotidine (PEPCID) 20 MG tablet Take 20 mg by mouth at bedtime.  (Patient not taking: Reported on 05/14/2020)     IMBRUVICA 280 MG TABS TAKE 1 TABLET DAILY 56 tablet 5   lidocaine-prilocaine (EMLA) cream Apply a nickel size amount to skin on top of port- cover with saran wrap at least 1 hour before access 30 g 0   Magnesium 200 MG TABS Take 2 tablets by mouth daily.     methocarbamol (ROBAXIN) 500 MG tablet Take 2 tablets (1,000 mg total) by mouth every 8 (eight) hours as needed for muscle spasms. (Patient not taking: Reported on 05/14/2020) 60 tablet 0   montelukast (SINGULAIR) 10 MG tablet Take 10 mg by mouth at bedtime.     progesterone (PROMETRIUM) 200 MG capsule Take 1 capsule (200 mg total) by mouth at bedtime. 90 capsule 3   traZODone (DESYREL) 100 MG tablet TAKE 1 TABLET AT BEDTIME 90 tablet 3   valACYclovir (VALTREX) 1000 MG tablet TAKE 1 TABLET BY MOUTH TWICE DAILY FOR 5 DAYS THEN TAKE 1 TABLET BY MOUTH DAILY 80 tablet 6   venetoclax (VENCLEXTA) 100 MG tablet TAKE 1 TABLET (100 MG) BY MOUTH DAILY. TAKE WITH FOOD AND WATER AT APPROXIMATELY THE SAME TIME EACH DAY. 30  tablet 6   Zinc 30 MG TABS Take 1 tablet by mouth daily.     Current Facility-Administered Medications  Medication Dose Route Frequency Provider Last Rate Last Admin   0.9 %  sodium chloride infusion   Intravenous PRN Causey, Charlestine Massed, NP        OBJECTIVE: white woman in no acute distress  There were no vitals filed for this visit.  Wt Readings from Last 3 Encounters:  01/14/21 122 lb 11.2 oz (55.7 kg)  09/26/20 124 lb 12.8 oz (56.6 kg)  05/14/20 125 lb 6.4 oz (56.9 kg)   There is no height or weight on file to calculate BMI.    ECOG FS:1 - Symptomatic but completely ambulatory  Sclerae unicteric, EOMs intact Wearing a mask No cervical or supraclavicular adenopathy, no axillary adenopathy Lungs no rales or rhonchi Heart regular rate and rhythm Abd soft,  nontender, positive bowel sounds MSK no focal spinal tenderness, no upper extremity lymphedema Neuro: nonfocal, well oriented, appropriate affect Breasts: Deferred   LAB RESULTS:  Lab Results  Component Value Date   WBC 4.5 04/12/2021   NEUTROABS 3.0 04/12/2021   HGB 14.2 04/12/2021   HCT 42.5 04/12/2021   MCV 94.7 04/12/2021   PLT 112 (L) 04/12/2021      Chemistry      Component Value Date/Time   NA 143 04/12/2021 0943   NA 140 12/31/2016 1406   K 3.4 (L) 04/12/2021 0943   K 3.6 12/31/2016 1406   CL 109 04/12/2021 0943   CL 101 12/15/2011 1322   CO2 28 04/12/2021 0943   CO2 24 12/31/2016 1406   BUN 13 04/12/2021 0943   BUN 12.8 12/31/2016 1406   CREATININE 0.92 04/12/2021 0943   CREATININE 0.9 12/31/2016 1406      Component Value Date/Time   CALCIUM 9.1 04/12/2021 0943   CALCIUM 8.8 12/31/2016 1406   ALKPHOS 76 04/12/2021 0943   ALKPHOS 71 12/31/2016 1406   AST 28 04/12/2021 0943   AST 18 12/31/2016 1406   ALT 29 04/12/2021 0943   ALT 11 12/31/2016 1406   BILITOT 0.6 04/12/2021 0943   BILITOT 0.49 12/31/2016 1406      STUDIES: No results found.   ASSESSMENT: 71 y.o.  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 rigors 02/27/2014: received IV vancomycin and levaquin briefly, then levaquin po (pcn allergy)  (6) bendamustine/ rituximab for 6 cycles, completed 05/04/2014, with improvement but not normalization of the absolute lymphocyte count  (7) ibrutinib started 05/08/2014, currently at 280 mg daily. Stopped by patient in June, 2019 restarted in September, 2019, discontinued October 2020  (a) hepatitis B studies 03/06/2014 and 12/31/2016, negative  (b) ibrutinib restarted 01/06/2019 (at 280 mg daily) in conjunction with venetoclax  (c) ibrutinib held after 10/18/2019, now used as needed for wart control  (8) CT abd/pelvis 10/22/2018 shows retroperitoneal adenopathy, increased splenomegaly  (a) repeat CT scan of the neck chest abdomen and pelvis 07/02/2019, with resolution of the previously noted adenopathy and splenomegaly.  (9) started venetoclax 12/15/2018  (a) uneventful ramp-up to the 100 mg a day dose, then held at that dose due to symptoms  (b) repeat hepatitis panel 10/18/2019 again negative  (10) evusheld started march 2022, repeated Sept 2022   PLAN: Patient is here for follow up on Ibrutinib and venetoclax. She is tolerating this combination very well. No major adverse effects. No PE findings concerning for CLL  relapse. Labs reviewed, no concerns for relapse She can RTC in 3 months for labs and follow up in 6 months She was encouraged to contact us with any questions or concerns.  She knows to call us for any other issue that may develop before the next visit.  Total encounter time 30 minutes. This is a new patient to me, transfer from Dr. Jana Hakim upon his  retirement.  *Total Encounter Time as defined by the Centers for Medicare and Medicaid Services includes, in addition to the face-to-face time of a patient visit (documented in the note above) non-face-to-face time: obtaining and reviewing outside history, ordering and reviewing medications, tests or procedures, care coordination (communications with other health care professionals or caregivers) and documentation in the medical record.

## 2021-04-17 ENCOUNTER — Ambulatory Visit: Payer: Medicare Other

## 2021-04-17 ENCOUNTER — Other Ambulatory Visit: Payer: Medicare Other

## 2021-04-17 ENCOUNTER — Encounter: Payer: Self-pay | Admitting: Hematology and Oncology

## 2021-04-17 ENCOUNTER — Other Ambulatory Visit: Payer: Self-pay

## 2021-04-17 ENCOUNTER — Inpatient Hospital Stay (HOSPITAL_BASED_OUTPATIENT_CLINIC_OR_DEPARTMENT_OTHER): Payer: Medicare Other | Admitting: Hematology and Oncology

## 2021-04-17 VITALS — BP 113/45 | HR 74 | Temp 97.8°F | Resp 16 | Ht <= 58 in | Wt 122.1 lb

## 2021-04-17 DIAGNOSIS — C9110 Chronic lymphocytic leukemia of B-cell type not having achieved remission: Secondary | ICD-10-CM

## 2021-04-17 DIAGNOSIS — Z8572 Personal history of non-Hodgkin lymphomas: Secondary | ICD-10-CM | POA: Diagnosis not present

## 2021-04-29 DIAGNOSIS — C9110 Chronic lymphocytic leukemia of B-cell type not having achieved remission: Secondary | ICD-10-CM | POA: Diagnosis not present

## 2021-04-29 DIAGNOSIS — K219 Gastro-esophageal reflux disease without esophagitis: Secondary | ICD-10-CM | POA: Diagnosis not present

## 2021-04-29 DIAGNOSIS — Z8619 Personal history of other infectious and parasitic diseases: Secondary | ICD-10-CM | POA: Diagnosis not present

## 2021-04-29 DIAGNOSIS — N1831 Chronic kidney disease, stage 3a: Secondary | ICD-10-CM | POA: Diagnosis not present

## 2021-05-03 ENCOUNTER — Other Ambulatory Visit: Payer: Self-pay | Admitting: Obstetrics

## 2021-05-03 DIAGNOSIS — Z79890 Hormone replacement therapy (postmenopausal): Secondary | ICD-10-CM

## 2021-05-09 ENCOUNTER — Telehealth: Payer: Self-pay | Admitting: *Deleted

## 2021-05-09 NOTE — Telephone Encounter (Signed)
Pt called to office stating her Divigel isn't working well for her symptoms, would like to know if she can get more, ?increase.  ? ? ? ? ? ?Please advise ?

## 2021-05-13 ENCOUNTER — Other Ambulatory Visit (HOSPITAL_COMMUNITY): Payer: Self-pay

## 2021-05-18 ENCOUNTER — Other Ambulatory Visit: Payer: Self-pay | Admitting: Obstetrics

## 2021-05-18 DIAGNOSIS — N951 Menopausal and female climacteric states: Secondary | ICD-10-CM

## 2021-05-18 MED ORDER — ESTRADIOL 1 MG/GM TD GEL: TRANSDERMAL | 4 refills | Status: DC

## 2021-06-03 DIAGNOSIS — Z789 Other specified health status: Secondary | ICD-10-CM | POA: Diagnosis not present

## 2021-06-03 DIAGNOSIS — J309 Allergic rhinitis, unspecified: Secondary | ICD-10-CM | POA: Diagnosis not present

## 2021-06-03 DIAGNOSIS — H6990 Unspecified Eustachian tube disorder, unspecified ear: Secondary | ICD-10-CM | POA: Diagnosis not present

## 2021-06-03 DIAGNOSIS — C9510 Chronic leukemia of unspecified cell type not having achieved remission: Secondary | ICD-10-CM | POA: Diagnosis not present

## 2021-06-25 ENCOUNTER — Other Ambulatory Visit: Payer: Self-pay | Admitting: *Deleted

## 2021-06-25 DIAGNOSIS — C9110 Chronic lymphocytic leukemia of B-cell type not having achieved remission: Secondary | ICD-10-CM

## 2021-06-25 MED ORDER — VENETOCLAX 100 MG PO TABS: ORAL_TABLET | ORAL | 6 refills | Status: DC

## 2021-07-04 ENCOUNTER — Other Ambulatory Visit: Payer: Self-pay | Admitting: *Deleted

## 2021-07-04 DIAGNOSIS — C9110 Chronic lymphocytic leukemia of B-cell type not having achieved remission: Secondary | ICD-10-CM

## 2021-07-08 ENCOUNTER — Other Ambulatory Visit: Payer: Self-pay

## 2021-07-08 ENCOUNTER — Inpatient Hospital Stay: Payer: Medicare Other | Attending: Hematology and Oncology

## 2021-07-08 DIAGNOSIS — C9110 Chronic lymphocytic leukemia of B-cell type not having achieved remission: Secondary | ICD-10-CM | POA: Insufficient documentation

## 2021-07-08 DIAGNOSIS — Z95828 Presence of other vascular implants and grafts: Secondary | ICD-10-CM

## 2021-07-08 LAB — CBC WITH DIFFERENTIAL (CANCER CENTER ONLY)
Abs Immature Granulocytes: 0.05 10*3/uL (ref 0.00–0.07)
Basophils Absolute: 0 10*3/uL (ref 0.0–0.1)
Basophils Relative: 0 %
Eosinophils Absolute: 0.1 10*3/uL (ref 0.0–0.5)
Eosinophils Relative: 1 %
HCT: 41.1 % (ref 36.0–46.0)
Hemoglobin: 14.4 g/dL (ref 12.0–15.0)
Immature Granulocytes: 1 %
Lymphocytes Relative: 21 %
Lymphs Abs: 1.2 10*3/uL (ref 0.7–4.0)
MCH: 33.3 pg (ref 26.0–34.0)
MCHC: 35 g/dL (ref 30.0–36.0)
MCV: 94.9 fL (ref 80.0–100.0)
Monocytes Absolute: 0.6 10*3/uL (ref 0.1–1.0)
Monocytes Relative: 10 %
Neutro Abs: 3.7 10*3/uL (ref 1.7–7.7)
Neutrophils Relative %: 67 %
Platelet Count: 127 10*3/uL — ABNORMAL LOW (ref 150–400)
RBC: 4.33 MIL/uL (ref 3.87–5.11)
RDW: 12.2 % (ref 11.5–15.5)
WBC Count: 5.7 10*3/uL (ref 4.0–10.5)
nRBC: 0 % (ref 0.0–0.2)

## 2021-07-08 LAB — CMP (CANCER CENTER ONLY)
ALT: 23 U/L (ref 0–44)
AST: 21 U/L (ref 15–41)
Albumin: 4 g/dL (ref 3.5–5.0)
Alkaline Phosphatase: 96 U/L (ref 38–126)
Anion gap: 6 (ref 5–15)
BUN: 16 mg/dL (ref 8–23)
CO2: 27 mmol/L (ref 22–32)
Calcium: 9 mg/dL (ref 8.9–10.3)
Chloride: 105 mmol/L (ref 98–111)
Creatinine: 0.95 mg/dL (ref 0.44–1.00)
GFR, Estimated: >60 mL/min (ref >60)
Glucose, Bld: 90 mg/dL (ref 70–99)
Potassium: 4 mmol/L (ref 3.5–5.1)
Sodium: 138 mmol/L (ref 135–145)
Total Bilirubin: 0.9 mg/dL (ref 0.3–1.2)
Total Protein: 6.1 g/dL — ABNORMAL LOW (ref 6.5–8.1)

## 2021-07-08 LAB — MAGNESIUM: Magnesium: 1.9 mg/dL (ref 1.7–2.4)

## 2021-07-08 LAB — LACTATE DEHYDROGENASE: LDH: 149 U/L (ref 98–192)

## 2021-07-08 MED ORDER — HEPARIN SOD (PORK) LOCK FLUSH 100 UNIT/ML IV SOLN
500.0000 [IU] | Freq: Once | INTRAVENOUS | Status: AC
  Administered 2021-07-08: 500 [IU]

## 2021-07-08 MED ORDER — SODIUM CHLORIDE 0.9% FLUSH
10.0000 mL | Freq: Once | INTRAVENOUS | Status: AC
  Administered 2021-07-08: 10 mL

## 2021-07-09 LAB — BETA 2 MICROGLOBULIN, SERUM: Beta-2 Microglobulin: 2.6 mg/L — ABNORMAL HIGH (ref 0.6–2.4)

## 2021-07-15 ENCOUNTER — Other Ambulatory Visit: Payer: Medicare Other

## 2021-08-19 DIAGNOSIS — J019 Acute sinusitis, unspecified: Secondary | ICD-10-CM | POA: Diagnosis not present

## 2021-08-19 DIAGNOSIS — R197 Diarrhea, unspecified: Secondary | ICD-10-CM | POA: Diagnosis not present

## 2021-08-19 DIAGNOSIS — C9110 Chronic lymphocytic leukemia of B-cell type not having achieved remission: Secondary | ICD-10-CM | POA: Diagnosis not present

## 2021-08-19 DIAGNOSIS — J069 Acute upper respiratory infection, unspecified: Secondary | ICD-10-CM | POA: Diagnosis not present

## 2021-08-29 ENCOUNTER — Other Ambulatory Visit: Payer: Self-pay | Admitting: *Deleted

## 2021-08-29 DIAGNOSIS — C9110 Chronic lymphocytic leukemia of B-cell type not having achieved remission: Secondary | ICD-10-CM

## 2021-08-29 MED ORDER — VENETOCLAX 100 MG PO TABS: ORAL_TABLET | ORAL | 6 refills | Status: DC

## 2021-10-07 ENCOUNTER — Inpatient Hospital Stay (HOSPITAL_BASED_OUTPATIENT_CLINIC_OR_DEPARTMENT_OTHER): Payer: Medicare Other | Admitting: Hematology and Oncology

## 2021-10-07 ENCOUNTER — Other Ambulatory Visit: Payer: Self-pay

## 2021-10-07 ENCOUNTER — Encounter: Payer: Self-pay | Admitting: Hematology and Oncology

## 2021-10-07 ENCOUNTER — Inpatient Hospital Stay: Payer: Medicare Other | Attending: Hematology and Oncology

## 2021-10-07 DIAGNOSIS — C9110 Chronic lymphocytic leukemia of B-cell type not having achieved remission: Secondary | ICD-10-CM

## 2021-10-07 DIAGNOSIS — D696 Thrombocytopenia, unspecified: Secondary | ICD-10-CM | POA: Diagnosis not present

## 2021-10-07 DIAGNOSIS — Z7969 Long term (current) use of other immunomodulators and immunosuppressants: Secondary | ICD-10-CM | POA: Diagnosis not present

## 2021-10-07 DIAGNOSIS — Z95828 Presence of other vascular implants and grafts: Secondary | ICD-10-CM

## 2021-10-07 LAB — CMP (CANCER CENTER ONLY)
ALT: 24 U/L (ref 0–44)
AST: 26 U/L (ref 15–41)
Albumin: 4.2 g/dL (ref 3.5–5.0)
Alkaline Phosphatase: 101 U/L (ref 38–126)
Anion gap: 4 — ABNORMAL LOW (ref 5–15)
BUN: 15 mg/dL (ref 8–23)
CO2: 27 mmol/L (ref 22–32)
Calcium: 8.9 mg/dL (ref 8.9–10.3)
Chloride: 107 mmol/L (ref 98–111)
Creatinine: 0.96 mg/dL (ref 0.44–1.00)
GFR, Estimated: >60 mL/min (ref >60)
Glucose, Bld: 89 mg/dL (ref 70–99)
Potassium: 3.8 mmol/L (ref 3.5–5.1)
Sodium: 138 mmol/L (ref 135–145)
Total Bilirubin: 0.6 mg/dL (ref 0.3–1.2)
Total Protein: 6.1 g/dL — ABNORMAL LOW (ref 6.5–8.1)

## 2021-10-07 LAB — CBC WITH DIFFERENTIAL (CANCER CENTER ONLY)
Abs Immature Granulocytes: 0.05 10*3/uL (ref 0.00–0.07)
Basophils Absolute: 0 10*3/uL (ref 0.0–0.1)
Basophils Relative: 1 %
Eosinophils Absolute: 0.1 10*3/uL (ref 0.0–0.5)
Eosinophils Relative: 1 %
HCT: 41.9 % (ref 36.0–46.0)
Hemoglobin: 14.8 g/dL (ref 12.0–15.0)
Immature Granulocytes: 1 %
Lymphocytes Relative: 23 %
Lymphs Abs: 1.2 10*3/uL (ref 0.7–4.0)
MCH: 33.5 pg (ref 26.0–34.0)
MCHC: 35.3 g/dL (ref 30.0–36.0)
MCV: 94.8 fL (ref 80.0–100.0)
Monocytes Absolute: 0.6 10*3/uL (ref 0.1–1.0)
Monocytes Relative: 12 %
Neutro Abs: 3.2 10*3/uL (ref 1.7–7.7)
Neutrophils Relative %: 62 %
Platelet Count: 127 10*3/uL — ABNORMAL LOW (ref 150–400)
RBC: 4.42 MIL/uL (ref 3.87–5.11)
RDW: 11.8 % (ref 11.5–15.5)
WBC Count: 5.2 10*3/uL (ref 4.0–10.5)
nRBC: 0 % (ref 0.0–0.2)

## 2021-10-07 MED ORDER — SODIUM CHLORIDE 0.9% FLUSH
10.0000 mL | Freq: Once | INTRAVENOUS | Status: AC
  Administered 2021-10-07: 10 mL

## 2021-10-07 MED ORDER — VALACYCLOVIR HCL 1 G PO TABS: ORAL_TABLET | ORAL | 6 refills | Status: DC

## 2021-10-07 MED ORDER — HEPARIN SOD (PORK) LOCK FLUSH 100 UNIT/ML IV SOLN
500.0000 [IU] | Freq: Once | INTRAVENOUS | Status: AC
  Administered 2021-10-07: 500 [IU]

## 2021-10-07 MED ORDER — VENETOCLAX 100 MG PO TABS: ORAL_TABLET | ORAL | 6 refills | Status: DC

## 2021-10-07 NOTE — Progress Notes (Signed)
[facility]  [phone] [phone]     ID: Toney Sang Janoski   DOB: [birth year]  MR#: [id number]  BTD#:[id number]  Patient Care Team: Leeroy Cha, MD as PCP - General (Internal Medicine) Magrinat, Virgie Dad, MD (Inactive) as Consulting Physician (Oncology) [name], MD as Consulting Physician (Obstetrics and Gynecology) Vallarie Mare, MD as Consulting Physician (Neurosurgery) OTHER MD:  CHIEF COMPLAINT: Chronic lymphocytic leukemia  CURRENT TREATMENT: venetoclax, ibrutinib  INTERVAL HISTORY:  Rachel [name] returns today for follow-up of her chronic lymphocytic leukemia/small-lymphocytic lymphoma.   She remains very stable on her medications She has been taking ibrutinib and venetoclax daily as instructed. She was hoping to get rid of some of the medications. She was wondering if she can stop Ibrutinib and venetoclax as well as a few other medications. She is still having about 5 bowel movements a day. Sometimes, she can have up to 10 bowel movements a day. She has not been using imodium except when she has to go out. She has some morning sickness everyday. No B symptoms otherwise. Rest of the pertinent 10 point ROS reviewed and negative.   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 She has been treated with Rituxan, ofatumumab, cladribine, bendamustine and currently with ibrutinib, with details summarized below.   PAST MEDICAL HISTORY: 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   Diverticulitis    Leukemia ([place])    Lower back pain     PAST SURGICAL HISTORY: Past Surgical History:  Procedure Laterality Date   BREAST SURGERY  2015   left breast bx    BUNIONECTOMY     right foot   lining of uterus removed     OTHER SURGICAL HISTORY     removal of uterine lining   TONSILLECTOMY      FAMILY HISTORY Family History  Problem Relation Age of Onset   Depression Son   The patient's mother will turn 91 on March 2021   GYNECOLOGIC HISTORY: Menarche age 13, first live birth age 51, she is [place] P3. She stopped having periods in 1999 after endometrial stripping. She continues on hormone replacement.   SOCIAL HISTORY: [name] is retired from [employer].  The patient and her husband are now living in [place].  Her son [name] lives in [place]; he works for a [employer]. A second son lives in [place] and has two children. Son [name] lives in [place]. She has 8 grandchildren, the oldest is 60 and the youngest is 64.  ADVANCED DIRECTIVES: In place   HEALTH MAINTENANCE: Social History   Tobacco Use   Smoking status: Never   Smokeless tobacco: Never  Vaping Use   Vaping Use: Never used  Substance Use Topics   Alcohol use: Yes    Alcohol/week: 0.0 standard drinks of alcohol    Comment: 2 shots a day   Drug use: No     Colonoscopy: 12/2018 (Dr. Collene Mares), repeat in 10 years  PAP: December 2012/ [name]  Bone density: Due  Lipid panel: per Dr Drema Dallas  Allergies  Allergen Reactions   Penicillins Anaphylaxis    "stopped breathing" "drops my heartbeat" "I just pass out"   Lidocaine-Tetracaine-Epineph Other (See Comments)    Pt came in for CODE STROKE after receiving Lidocaine  with Epi in dental office. No stroke, but had symptoms.     Current Outpatient Medications  Medication Sig Dispense Refill   acetaminophen (TYLENOL) 500 MG tablet Take 500-1,000 mg by mouth every 6 (six) hours as needed for headache.      albuterol (VENTOLIN HFA) 108 (90 Base) MCG/ACT inhaler Inhale 1-2 puffs into the lungs every 6 (six) hours as needed for shortness of breath or wheezing.     azelastine (ASTELIN) 0.1 % nasal spray Place into  both nostrils 2 (two) times daily. Use in each nostril as directed     betamethasone dipropionate (DIPROLENE) 0.05 % ointment Apply topically 2 (two) times daily.     Calcium Citrate-Vitamin D 200-250 MG-UNIT TABS Take 1 tablet by mouth 4 (four) times daily.     Cholecalciferol (VITAMIN D-3) 125 MCG (5000 UT) TABS Take 2 tablets by mouth daily.     Estradiol 1 MG/GM GEL Thinly apply contents of 1 complete foil packet to left or right upper thigh on alternating days.  Wash hands thoroughly after each application. 3 g 4   IMBRUVICA 280 MG TABS TAKE 1 TABLET DAILY 56 tablet 5   lidocaine-prilocaine (EMLA) cream Apply a nickel size amount to skin on top of port- cover with saran wrap at least 1 hour before access 30 g 0   Magnesium 200 MG TABS Take 2 tablets by mouth daily.     montelukast (SINGULAIR) 10 MG tablet Take 10 mg by mouth at bedtime.     progesterone (PROMETRIUM) 200 MG capsule TAKE 1 CAPSULE AT BEDTIME 90 capsule 3   traZODone (DESYREL) 100 MG tablet TAKE 1 TABLET AT BEDTIME 90 tablet 3   Zinc 30 MG TABS Take 1 tablet by mouth daily.     Current Facility-Administered Medications  Medication Dose Route Frequency Provider Last Rate Last Admin   0.9 %  sodium chloride infusion   Intravenous PRN Causey, Charlestine Massed, NP        OBJECTIVE: white woman in no acute distress  Vitals:   10/07/21 1129  BP: (!) 109/57  Pulse: 71  Resp: 17  Temp: 97.7 F (36.5 C)  SpO2: 98%    Wt Readings from Last 3 Encounters:  10/07/21 124 lb 9 oz (56.5 kg)  04/17/21 122 lb 1.6 oz (55.4 kg)  01/14/21 122 lb 11.2 oz (55.7 kg)   Body mass index is 26.96 kg/m.    ECOG FS:1 - Symptomatic but completely ambulatory  Physical Exam Constitutional:      Appearance: Normal appearance.  Cardiovascular:     Rate and Rhythm: Normal rate and regular rhythm.     Pulses: Normal pulses.     Heart sounds: Normal heart sounds.  Pulmonary:     Effort: Pulmonary effort is normal.     Breath sounds:  Normal breath sounds.  Abdominal:     General: Abdomen is flat. Bowel sounds are normal. There is no distension.     Palpations: There is no mass.  Musculoskeletal:        General: No swelling or tenderness. Normal range of motion.     Cervical back: Normal range of motion and neck supple. No rigidity.  Lymphadenopathy:     Cervical: No cervical adenopathy.  Skin:    General: Skin is warm and dry.  Neurological:     General: No focal deficit present.     Mental Status: She is alert.  Psychiatric:        Mood and Affect: Mood  normal.     LAB RESULTS:  Lab Results  Component Value Date   WBC 5.2 10/07/2021   NEUTROABS 3.2 10/07/2021   HGB 14.8 10/07/2021   HCT 41.9 10/07/2021   MCV 94.8 10/07/2021   PLT 127 (L) 10/07/2021      Chemistry      Component Value Date/Time   NA 138 10/07/2021 1119   NA 140 12/31/2016 1406   K 3.8 10/07/2021 1119   K 3.6 12/31/2016 1406   CL 107 10/07/2021 1119   CL 101 12/15/2011 1322   CO2 27 10/07/2021 1119   CO2 24 12/31/2016 1406   BUN 15 10/07/2021 1119   BUN 12.8 12/31/2016 1406   CREATININE 0.96 10/07/2021 1119   CREATININE 0.9 12/31/2016 1406      Component Value Date/Time   CALCIUM 8.9 10/07/2021 1119   CALCIUM 8.8 12/31/2016 1406   ALKPHOS 101 10/07/2021 1119   ALKPHOS 71 12/31/2016 1406   AST 26 10/07/2021 1119   AST 18 12/31/2016 1406   ALT 24 10/07/2021 1119   ALT 11 12/31/2016 1406   BILITOT 0.6 10/07/2021 1119   BILITOT 0.49 12/31/2016 1406      STUDIES: No results found.   ASSESSMENT: 71 y.o.  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 rigors 02/27/2014: received IV vancomycin and levaquin briefly, then levaquin po (pcn allergy)  (6) bendamustine/ rituximab for 6 cycles, completed 05/04/2014, with improvement but not normalization of the absolute lymphocyte count  (7) ibrutinib started 05/08/2014, currently at 280 mg daily. Stopped by patient in June, 2019 restarted in September, 2019, discontinued October 2020  (a) hepatitis B studies 03/06/2014 and 12/31/2016, negative  (b) ibrutinib restarted 01/06/2019 (at 280 mg daily) in conjunction with venetoclax  (c) ibrutinib held after 10/18/2019, now used as needed for wart control  (8) CT abd/pelvis 10/22/2018 shows retroperitoneal adenopathy, increased splenomegaly  (a) repeat CT scan of the neck chest abdomen and pelvis 07/02/2019, with resolution of the previously noted adenopathy and splenomegaly.  (9) started venetoclax 12/15/2018  (a) uneventful ramp-up to the 100 mg a day dose, then held at that dose due to symptoms  (b) repeat hepatitis panel 10/18/2019 again negative  (10) evusheld started march 2022, repeated Sept 2022   PLAN:  She is wondering if she can discontinue some of her medication given ongoing diarrhea. In the past, she was continued on Ibrutinib for wart control after progressing in 2021. We have today discussed about omitting Ibrutinib and continuing venetoclax alone with valcyclovir. I dont think she will need TLS prophylaxis at this time. PE unremarkable, CBC stable, mild thrombocytopenia. RTC in 8 weeks with repeat labs to ensure venetoclax alone is adequate.  Total encounter time 30 minutes.   *Total Encounter Time as defined by the Centers for Medicare and Medicaid Services  includes, in addition to the face-to-face time of a patient visit (documented in the note above) non-face-to-face time: obtaining and  reviewing outside history, ordering and reviewing medications, tests or procedures, care coordination (communications with other health care professionals or caregivers) and documentation in the medical record.

## 2021-10-11 ENCOUNTER — Other Ambulatory Visit: Payer: Self-pay | Admitting: *Deleted

## 2021-10-11 DIAGNOSIS — C9110 Chronic lymphocytic leukemia of B-cell type not having achieved remission: Secondary | ICD-10-CM

## 2021-10-11 MED ORDER — VENETOCLAX 100 MG PO TABS: ORAL_TABLET | ORAL | 6 refills | Status: DC

## 2021-10-11 MED ORDER — VALACYCLOVIR HCL 1 G PO TABS: ORAL_TABLET | ORAL | 6 refills | Status: DC

## 2021-10-23 DIAGNOSIS — Z1231 Encounter for screening mammogram for malignant neoplasm of breast: Secondary | ICD-10-CM | POA: Diagnosis not present

## 2021-10-24 DIAGNOSIS — Z961 Presence of intraocular lens: Secondary | ICD-10-CM | POA: Diagnosis not present

## 2021-10-24 DIAGNOSIS — H35363 Drusen (degenerative) of macula, bilateral: Secondary | ICD-10-CM | POA: Diagnosis not present

## 2021-10-24 DIAGNOSIS — H02889 Meibomian gland dysfunction of unspecified eye, unspecified eyelid: Secondary | ICD-10-CM | POA: Diagnosis not present

## 2021-10-24 DIAGNOSIS — H26493 Other secondary cataract, bilateral: Secondary | ICD-10-CM | POA: Diagnosis not present

## 2021-10-29 ENCOUNTER — Encounter: Payer: Self-pay | Admitting: Obstetrics

## 2021-11-08 DIAGNOSIS — H40013 Open angle with borderline findings, low risk, bilateral: Secondary | ICD-10-CM | POA: Diagnosis not present

## 2021-11-08 DIAGNOSIS — H26491 Other secondary cataract, right eye: Secondary | ICD-10-CM | POA: Diagnosis not present

## 2021-11-08 DIAGNOSIS — H26493 Other secondary cataract, bilateral: Secondary | ICD-10-CM | POA: Diagnosis not present

## 2021-11-18 DIAGNOSIS — H26491 Other secondary cataract, right eye: Secondary | ICD-10-CM | POA: Diagnosis not present

## 2021-11-22 ENCOUNTER — Other Ambulatory Visit (HOSPITAL_COMMUNITY)
Admission: RE | Admit: 2021-11-22 | Discharge: 2021-11-22 | Disposition: A | Discharge status: Home / Self Care | Payer: Medicare Other | Source: Ambulatory Visit | Attending: Obstetrics and Gynecology | Admitting: Obstetrics and Gynecology

## 2021-11-22 ENCOUNTER — Ambulatory Visit (INDEPENDENT_AMBULATORY_CARE_PROVIDER_SITE_OTHER): Payer: Medicare Other | Admitting: Obstetrics and Gynecology

## 2021-11-22 ENCOUNTER — Encounter: Payer: Self-pay | Admitting: Obstetrics and Gynecology

## 2021-11-22 VITALS — BP 98/64 | HR 65 | Ht <= 58 in | Wt 126.0 lb

## 2021-11-22 DIAGNOSIS — R87612 Low grade squamous intraepithelial lesion on cytologic smear of cervix (LGSIL): Secondary | ICD-10-CM

## 2021-11-22 DIAGNOSIS — Z1151 Encounter for screening for human papillomavirus (HPV): Secondary | ICD-10-CM | POA: Insufficient documentation

## 2021-11-22 DIAGNOSIS — N951 Menopausal and female climacteric states: Secondary | ICD-10-CM

## 2021-11-22 DIAGNOSIS — Z01419 Encounter for gynecological examination (general) (routine) without abnormal findings: Secondary | ICD-10-CM | POA: Insufficient documentation

## 2021-11-22 NOTE — Progress Notes (Signed)
Pt presents for annual reports hot flashes no relief with Estradiol gel.

## 2021-11-22 NOTE — Patient Instructions (Signed)

## 2021-11-22 NOTE — Progress Notes (Signed)
Rachel [name] is a 71 y.o. G9P0 female here for a routine annual gynecologic exam.  Current complaints: menopausal Sx. Pt reports problems with hot flashes since age 61. No dose of HRT was helped with her Sx in any way. She reports 3-4 .episodes of hot flashes a day. H/O CLL in remission   Denies abnormal vaginal bleeding, discharge, pelvic pain,  or other gynecologic concerns. Not sexual active   Gynecologic History No LMP recorded. Patient is postmenopausal. Contraception: post menopausal status Last Pap: 2022. Results were: abnormal. Colpo 3/22, LGSIL Last mammogram: 8/23. Results were: normal  Pt reports UTD on colon cancer screening  Obstetric History OB History  Gravida Para Term Preterm AB Living  3         3  SAB IAB Ectopic Multiple Live Births          3    # Outcome Date GA Lbr Len/2nd Weight Sex Delivery Anes PTL Lv  3 Gravida 09/02/76    M    LIV  2 Gravida 02/12/74    M    LIV  1 Gravida 10/08/72    M   Y     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   Diverticulitis    Diverticulitis 06/25/2015   Leukemia ([place])    Lower back pain     Past Surgical History:  Procedure Laterality Date   BREAST SURGERY  2015   left breast bx   BUNIONECTOMY     right foot   lining of uterus removed     OTHER SURGICAL HISTORY     removal of uterine lining   TONSILLECTOMY      Current Outpatient Medications on File Prior to Visit  Medication Sig Dispense Refill   acetaminophen (TYLENOL) 500 MG tablet Take 500-1,000 mg by mouth every 6 (six) hours as needed for headache.      albuterol (VENTOLIN HFA) 108 (90 Base) MCG/ACT inhaler Inhale 1-2 puffs into the lungs every 6 (six) hours as needed for shortness of breath or wheezing.     azelastine (ASTELIN) 0.1 % nasal spray Place into both nostrils 2 (two) times daily. Use in each nostril as directed     betamethasone dipropionate (DIPROLENE) 0.05 % ointment Apply topically 2 (two) times  daily.     Calcium Citrate-Vitamin D 200-250 MG-UNIT TABS Take 1 tablet by mouth 4 (four) times daily.     Cholecalciferol (VITAMIN D-3) 125 MCG (5000 UT) TABS Take 2 tablets by mouth daily.     Estradiol 1 MG/GM GEL Thinly apply contents of 1 complete foil packet to left or right upper thigh on alternating days.  Wash hands thoroughly after each application. 3 g 4   lidocaine-prilocaine (EMLA) cream Apply a nickel size amount to skin on top of port- cover with saran wrap at least 1 hour before access 30 g 0   Magnesium 200 MG TABS Take 2 tablets by mouth daily.     montelukast (SINGULAIR) 10 MG tablet Take 10 mg by mouth at bedtime.     progesterone (PROMETRIUM) 200 MG capsule TAKE 1 CAPSULE AT BEDTIME 90 capsule 3   traZODone (DESYREL) 100 MG tablet TAKE 1 TABLET AT BEDTIME 90 tablet 3   valACYclovir (VALTREX) 1000 MG tablet TAKE 1 TABLET BY MOUTH TWICE DAILY FOR 5 DAYS THEN TAKE 1 TABLET BY MOUTH DAILY 80 tablet 6   venetoclax (VENCLEXTA) 100 MG tablet TAKE 1 TABLET (100 MG) BY MOUTH  DAILY. TAKE WITH FOOD AND WATER AT APPROXIMATELY THE SAME TIME EACH DAY. 30 tablet 6   Zinc 30 MG TABS Take 1 tablet by mouth daily.     Current Facility-Administered Medications on File Prior to Visit  Medication Dose Route Frequency Provider Last Rate Last Admin   0.9 %  sodium chloride infusion   Intravenous PRN Causey, Charlestine Massed, NP        Allergies  Allergen Reactions   Penicillins Anaphylaxis    "stopped breathing" "drops my heartbeat" "I just pass out"   Lidocaine-Tetracaine-Epineph Other (See Comments)    Pt came in for CODE STROKE after receiving Lidocaine with Epi in dental office. No stroke, but had symptoms.     Social History   Socioeconomic History   Marital status: Married    Spouse name: Not on file   Number of children: Not on file   Years of education: Not on file   Highest education level: Not on file  Occupational History   Not on file  Tobacco Use   Smoking status:  Never    Passive exposure: Never   Smokeless tobacco: Never  Vaping Use   Vaping Use: Never used  Substance and Sexual Activity   Alcohol use: Yes    Comment: 1 shot liquor daily   Drug use: No   Sexual activity: Not Currently    Partners: Male    Birth control/protection: None  Other Topics Concern   Not on file  Social History Narrative   Not on file   Social Determinants of Health   Financial Resource Strain: Not on file  Food Insecurity: Not on file  Transportation Needs: Not on file  Physical Activity: Not on file  Stress: Not on file  Social Connections: Not on file  Intimate Partner Violence: Not on file    Family History  Problem Relation Age of Onset   Depression Son     The following portions of the patient's history were reviewed and updated as appropriate: allergies, current medications, past family history, past medical history, past social history, past surgical history and problem list.  Review of Systems Pertinent items noted in HPI and remainder of comprehensive ROS otherwise negative.   Objective:  BP 98/64   Pulse 65   Ht '4\' 9"'$  (1.448 m)   Wt 126 lb (57.2 kg)   BMI 27.27 kg/m  Chaperone present CONSTITUTIONAL: Well-developed, well-nourished female in no acute distress.  HENT:  Normocephalic, atraumatic, External right and left ear normal. Oropharynx is clear and moist EYES: Conjunctivae and EOM are normal. Pupils are equal, round, and reactive to light. No scleral icterus.  NECK: Normal range of motion, supple, no masses.  Normal thyroid.  SKIN: Skin is warm and dry. No rash noted. Not diaphoretic. No erythema. No pallor. [place]: Alert and oriented to person, place, and time. Normal reflexes, muscle tone coordination. No cranial nerve deficit noted. PSYCHIATRIC: Normal mood and affect. Normal behavior. Normal judgment and thought content. CARDIOVASCULAR: Normal heart rate noted, regular rhythm RESPIRATORY: Clear to auscultation bilaterally.  Effort and breath sounds normal, no problems with respiration noted. BREASTS: Symmetric in size. No masses, skin changes, nipple drainage, or lymphadenopathy. ABDOMEN: Soft, normal bowel sounds, no distention noted.  No tenderness, rebound or guarding.  PELVIC: Normal appearing external genitalia;  vaginal mucosa atrophic and cervix.  No abnormal discharge noted.  Pap smear obtained.  Normal uterine size, no other palpable masses, no uterine or adnexal tenderness. MUSCULOSKELETAL: Normal range of motion. No  tenderness.  No cyanosis, clubbing, or edema.  2+ distal pulses.   Assessment:  Annual gynecologic examination with pap smear Menopausal Sx Abnormal pap smear, s/p colpo Plan:  Will follow up results of pap smear and manage accordingly. Will check labs today. Not certain that pt's hot flashes are hormonal related. Discussed with pt Routine preventative health maintenance measures emphasized. Please refer to After Visit Summary for other counseling recommendations.    Chancy Milroy, MD, [place] Attending [place] for [facility], [place]

## 2021-11-23 LAB — THYROID PANEL WITH TSH
Free Thyroxine Index: 2.3 (ref 1.2–4.9)
T3 Uptake Ratio: 27 % (ref 24–39)
T4, Total: 8.6 ug/dL (ref 4.5–12.0)
TSH: 2.21 u[IU]/mL (ref 0.450–4.500)

## 2021-11-23 LAB — ESTRADIOL: Estradiol: 11.3 pg/mL

## 2021-11-26 DIAGNOSIS — Z23 Encounter for immunization: Secondary | ICD-10-CM | POA: Diagnosis not present

## 2021-11-26 DIAGNOSIS — R21 Rash and other nonspecific skin eruption: Secondary | ICD-10-CM | POA: Diagnosis not present

## 2021-11-28 LAB — CYTOLOGY - PAP
Comment: NEGATIVE
Comment: NEGATIVE
Comment: NEGATIVE
Diagnosis: UNDETERMINED — AB
HPV 16: NEGATIVE
HPV 18 / 45: NEGATIVE
High risk HPV: POSITIVE — AB

## 2021-12-02 ENCOUNTER — Other Ambulatory Visit: Payer: Self-pay | Admitting: Obstetrics

## 2021-12-02 ENCOUNTER — Inpatient Hospital Stay: Payer: Medicare Other

## 2021-12-02 ENCOUNTER — Inpatient Hospital Stay: Payer: Medicare Other | Admitting: Hematology and Oncology

## 2021-12-02 DIAGNOSIS — F5101 Primary insomnia: Secondary | ICD-10-CM

## 2021-12-18 ENCOUNTER — Telehealth: Payer: Self-pay | Admitting: *Deleted

## 2021-12-18 ENCOUNTER — Ambulatory Visit: Payer: Medicare Other | Admitting: Hematology and Oncology

## 2021-12-18 ENCOUNTER — Other Ambulatory Visit: Payer: Medicare Other

## 2021-12-18 DIAGNOSIS — U071 COVID-19: Secondary | ICD-10-CM | POA: Diagnosis not present

## 2021-12-18 DIAGNOSIS — Z1152 Encounter for screening for COVID-19: Secondary | ICD-10-CM | POA: Diagnosis not present

## 2021-12-18 NOTE — Telephone Encounter (Signed)
See there entry

## 2021-12-18 NOTE — Telephone Encounter (Signed)
Pt called this RN to state she is in [place] with her mother who was Covid + and had to be in the ICU- she has now returned to her previous skilled nursing facility.  Kiandra states she has had symptoms for approximately 10 days but just tested herself for Covid which shows positive result.  She is asking about " do I need to get that shot like I did before or anything else"  Pt states her symptoms are mild with noted sinus congestion and watery eyes " like I feel when I have allergies"  She denies any fevers and no breathing issues.   Note pt had Covid previously approximately 1 year ago and received monoclonal antibody therapy per Covid clinic. She received the Covid vaccine x 4 as well as 2 dose of Evushield.  Above reviewed by MD with MD as well as previous labs and current medication- informed for pt to treat symptoms and if they worsen to go to the ER.  Of note pt is scheduled for appointment in this office and will verify current office policy for Covid + patients.

## 2021-12-25 ENCOUNTER — Other Ambulatory Visit: Payer: Self-pay | Admitting: Obstetrics

## 2021-12-25 ENCOUNTER — Telehealth: Payer: Self-pay | Admitting: *Deleted

## 2021-12-25 DIAGNOSIS — N951 Menopausal and female climacteric states: Secondary | ICD-10-CM

## 2021-12-25 NOTE — Telephone Encounter (Signed)
Pt called for results from 11/22/21 visit. Was not able to check MyChart. Results and full comments given per Dr. Marjory Lies message to pt in [place]. Pt verbalized understanding and will schedule Pap next year in October.

## 2021-12-30 DIAGNOSIS — U071 COVID-19: Secondary | ICD-10-CM | POA: Diagnosis not present

## 2021-12-30 DIAGNOSIS — J45909 Unspecified asthma, uncomplicated: Secondary | ICD-10-CM | POA: Diagnosis not present

## 2021-12-30 DIAGNOSIS — J0100 Acute maxillary sinusitis, unspecified: Secondary | ICD-10-CM | POA: Diagnosis not present

## 2022-01-01 ENCOUNTER — Ambulatory Visit: Payer: Medicare Other | Admitting: Hematology and Oncology

## 2022-01-01 ENCOUNTER — Other Ambulatory Visit: Payer: Medicare Other

## 2022-01-02 ENCOUNTER — Telehealth: Payer: Self-pay | Admitting: *Deleted

## 2022-01-02 NOTE — Telephone Encounter (Signed)
This RN spoke to pt per her VM stating she retested on Monday and is positive again for Covid.  Note pt on 10/25 tested while in [place] and was positive for Covid at that time.  Per further discussion she states she was improving post first testing but then on Sunday she developed fever of 100.4, headache,sweating but "so cold that even 3 blankets couldn't get me warm"   Discussed above- with MD - including possible benefit with Paxlovid- but in talking with the pt she gave additional information -   She contacted her primary MD and was seen on Monday 11/6 and was tested there for Covid showing positive reading. Her primary MD is currently giving her instructions.  Pt states she would prefer not to do the Paxlovid.  Per MD - recommendation for pt to hold ventoclax for one week .  Pt verbalized understanding- request will be sent to reschedule her appt.

## 2022-01-03 ENCOUNTER — Inpatient Hospital Stay: Payer: Medicare Other | Admitting: Hematology and Oncology

## 2022-01-03 ENCOUNTER — Inpatient Hospital Stay: Payer: Medicare Other | Attending: Hematology and Oncology

## 2022-01-07 ENCOUNTER — Telehealth: Payer: Self-pay | Admitting: *Deleted

## 2022-01-07 NOTE — Telephone Encounter (Signed)
This RN spoke with pt who states " I am still testing positive for Covid "- she states presently her symptoms have resolved to "just a lot of sinus congestion "  She was given a medrol dose pak by her primary MD for benefit.  She is still holding the venetoclax-  She asked " so when do you want me to re test ?"  This RN informed her that retesting may not be beneficial.  She will continue to hold the venetoclax at this time and feels she does not want to restart it till she finishes the steroids.  This note will be forwarded to MD for her recommendations as well as appt request will be placed for rescheduling of appts to week of 82/50 per policy of this patient with compromised immunity and 21 day protocol ( due to possible prolonged shedding of virus).

## 2022-01-08 ENCOUNTER — Encounter: Payer: Self-pay | Admitting: Oncology

## 2022-01-10 DIAGNOSIS — H26492 Other secondary cataract, left eye: Secondary | ICD-10-CM | POA: Diagnosis not present

## 2022-01-20 DIAGNOSIS — H35363 Drusen (degenerative) of macula, bilateral: Secondary | ICD-10-CM | POA: Diagnosis not present

## 2022-01-20 DIAGNOSIS — H02889 Meibomian gland dysfunction of unspecified eye, unspecified eyelid: Secondary | ICD-10-CM | POA: Diagnosis not present

## 2022-01-20 DIAGNOSIS — H26492 Other secondary cataract, left eye: Secondary | ICD-10-CM | POA: Diagnosis not present

## 2022-01-22 DIAGNOSIS — C9110 Chronic lymphocytic leukemia of B-cell type not having achieved remission: Secondary | ICD-10-CM | POA: Diagnosis not present

## 2022-01-22 DIAGNOSIS — Z136 Encounter for screening for cardiovascular disorders: Secondary | ICD-10-CM | POA: Diagnosis not present

## 2022-01-22 DIAGNOSIS — Z0000 Encounter for general adult medical examination without abnormal findings: Secondary | ICD-10-CM | POA: Diagnosis not present

## 2022-01-22 DIAGNOSIS — D630 Anemia in neoplastic disease: Secondary | ICD-10-CM | POA: Diagnosis not present

## 2022-01-22 DIAGNOSIS — K5730 Diverticulosis of large intestine without perforation or abscess without bleeding: Secondary | ICD-10-CM | POA: Diagnosis not present

## 2022-01-22 DIAGNOSIS — J45909 Unspecified asthma, uncomplicated: Secondary | ICD-10-CM | POA: Diagnosis not present

## 2022-01-22 DIAGNOSIS — K219 Gastro-esophageal reflux disease without esophagitis: Secondary | ICD-10-CM | POA: Diagnosis not present

## 2022-01-22 DIAGNOSIS — J309 Allergic rhinitis, unspecified: Secondary | ICD-10-CM | POA: Diagnosis not present

## 2022-01-22 DIAGNOSIS — Z13220 Encounter for screening for lipoid disorders: Secondary | ICD-10-CM | POA: Diagnosis not present

## 2022-01-22 DIAGNOSIS — F3341 Major depressive disorder, recurrent, in partial remission: Secondary | ICD-10-CM | POA: Diagnosis not present

## 2022-01-22 DIAGNOSIS — I700 Atherosclerosis of aorta: Secondary | ICD-10-CM | POA: Diagnosis not present

## 2022-01-22 DIAGNOSIS — Z1331 Encounter for screening for depression: Secondary | ICD-10-CM | POA: Diagnosis not present

## 2022-01-27 ENCOUNTER — Telehealth: Payer: Self-pay | Admitting: Hematology and Oncology

## 2022-01-27 NOTE — Telephone Encounter (Signed)
Per 12/4 IB pt called and confirmed

## 2022-01-28 ENCOUNTER — Telehealth: Payer: Self-pay | Admitting: Hematology and Oncology

## 2022-01-28 NOTE — Telephone Encounter (Signed)
Contacted patient to scheduled appointments. Patient is aware of appointments that are scheduled.   

## 2022-02-06 ENCOUNTER — Other Ambulatory Visit: Payer: Self-pay | Admitting: Internal Medicine

## 2022-02-06 DIAGNOSIS — E785 Hyperlipidemia, unspecified: Secondary | ICD-10-CM

## 2022-02-10 ENCOUNTER — Other Ambulatory Visit: Payer: Self-pay

## 2022-02-10 ENCOUNTER — Inpatient Hospital Stay: Payer: Medicare Other

## 2022-02-10 ENCOUNTER — Telehealth: Payer: Self-pay | Admitting: Pharmacy Technician

## 2022-02-10 ENCOUNTER — Telehealth: Payer: Self-pay

## 2022-02-10 ENCOUNTER — Encounter: Payer: Self-pay | Admitting: Hematology and Oncology

## 2022-02-10 ENCOUNTER — Inpatient Hospital Stay: Payer: Medicare Other | Attending: Hematology and Oncology | Admitting: Hematology and Oncology

## 2022-02-10 ENCOUNTER — Other Ambulatory Visit (HOSPITAL_COMMUNITY): Payer: Self-pay

## 2022-02-10 VITALS — BP 117/59 | HR 71 | Temp 98.1°F | Resp 18 | Wt 122.5 lb

## 2022-02-10 DIAGNOSIS — C9110 Chronic lymphocytic leukemia of B-cell type not having achieved remission: Secondary | ICD-10-CM

## 2022-02-10 DIAGNOSIS — B079 Viral wart, unspecified: Secondary | ICD-10-CM | POA: Insufficient documentation

## 2022-02-10 DIAGNOSIS — Z95828 Presence of other vascular implants and grafts: Secondary | ICD-10-CM

## 2022-02-10 LAB — LACTATE DEHYDROGENASE: LDH: 147 U/L (ref 98–192)

## 2022-02-10 LAB — CMP (CANCER CENTER ONLY)
ALT: 22 U/L (ref 0–44)
AST: 22 U/L (ref 15–41)
Albumin: 4.3 g/dL (ref 3.5–5.0)
Alkaline Phosphatase: 88 U/L (ref 38–126)
Anion gap: 7 (ref 5–15)
BUN: 16 mg/dL (ref 8–23)
CO2: 28 mmol/L (ref 22–32)
Calcium: 9.7 mg/dL (ref 8.9–10.3)
Chloride: 105 mmol/L (ref 98–111)
Creatinine: 0.85 mg/dL (ref 0.44–1.00)
GFR, Estimated: >60 mL/min (ref >60)
Glucose, Bld: 105 mg/dL — ABNORMAL HIGH (ref 70–99)
Potassium: 3.8 mmol/L (ref 3.5–5.1)
Sodium: 140 mmol/L (ref 135–145)
Total Bilirubin: 0.6 mg/dL (ref 0.3–1.2)
Total Protein: 5.9 g/dL — ABNORMAL LOW (ref 6.5–8.1)

## 2022-02-10 LAB — CBC WITH DIFFERENTIAL (CANCER CENTER ONLY)
Abs Immature Granulocytes: 0.03 10*3/uL (ref 0.00–0.07)
Basophils Absolute: 0 10*3/uL (ref 0.0–0.1)
Basophils Relative: 0 %
Eosinophils Absolute: 0.1 10*3/uL (ref 0.0–0.5)
Eosinophils Relative: 1 %
HCT: 40.4 % (ref 36.0–46.0)
Hemoglobin: 14.6 g/dL (ref 12.0–15.0)
Immature Granulocytes: 1 %
Lymphocytes Relative: 23 %
Lymphs Abs: 1.3 10*3/uL (ref 0.7–4.0)
MCH: 33.3 pg (ref 26.0–34.0)
MCHC: 36.1 g/dL — ABNORMAL HIGH (ref 30.0–36.0)
MCV: 92 fL (ref 80.0–100.0)
Monocytes Absolute: 0.5 10*3/uL (ref 0.1–1.0)
Monocytes Relative: 9 %
Neutro Abs: 3.8 10*3/uL (ref 1.7–7.7)
Neutrophils Relative %: 66 %
Platelet Count: 142 10*3/uL — ABNORMAL LOW (ref 150–400)
RBC: 4.39 MIL/uL (ref 3.87–5.11)
RDW: 11.5 % (ref 11.5–15.5)
WBC Count: 5.8 10*3/uL (ref 4.0–10.5)
nRBC: 0 % (ref 0.0–0.2)

## 2022-02-10 MED ORDER — IBRUTINIB 280 MG PO TABS: 280.0000 mg | ORAL_TABLET | Freq: Every day | ORAL | 3 refills | Status: DC

## 2022-02-10 MED ORDER — IBRUTINIB 280 MG PO TABS
280.0000 mg | ORAL_TABLET | Freq: Every day | ORAL | 3 refills | Status: DC
  Filled 2022-02-10: qty 56, 56d supply, fill #0

## 2022-02-10 MED ORDER — SODIUM CHLORIDE 0.9% FLUSH
10.0000 mL | Freq: Once | INTRAVENOUS | Status: AC
  Administered 2022-02-10: 10 mL

## 2022-02-10 NOTE — Telephone Encounter (Signed)
Oral Oncology Patient Advocate Encounter  After completing a benefits investigation, prior authorization for Imbruvica is not required at this time through [facility].  Patient's copay is $38.     Rachel [name], CPhT-Adv Oncology Pharmacy Patient [place] Direct Number: [phone]  Fax: [phone]

## 2022-02-10 NOTE — Progress Notes (Signed)
[place]  [phone] [phone]) [phone]     ID: Rachel [name]   DOB: [birth year]  MR#: [id number]  WEX#:[id number]  Patient Care Team: Leeroy Cha, MD as PCP - General (Internal Medicine) Magrinat, Virgie Dad, MD (Inactive) as Consulting Physician (Oncology) [name], MD as Consulting Physician (Obstetrics and Gynecology) Vallarie Mare, MD as Consulting Physician (Neurosurgery) OTHER MD:  CHIEF COMPLAINT: Chronic lymphocytic leukemia  CURRENT TREATMENT: venetoclax, ibrutinib  INTERVAL HISTORY:  Rachel [name] returns today for follow-up of her chronic lymphocytic leukemia/small-lymphocytic lymphoma.   Since last visit, she had COVID tested positive oct 25 th.  She finally recovered from it. She says skin warts are back and she wants to go back on Ibrutinib.  Rest of the pertinent 10 point ROS reviewed and negative.   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 She has been treated with Rituxan, ofatumumab, cladribine, bendamustine and currently with ibrutinib, with details summarized below.   PAST MEDICAL HISTORY: 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   Diverticulitis    Diverticulitis 06/25/2015   Leukemia ([place])    Lower back pain     PAST SURGICAL HISTORY: Past Surgical History:  Procedure Laterality Date   BREAST SURGERY  2015   left breast bx   BUNIONECTOMY     right foot   lining of uterus removed     OTHER SURGICAL HISTORY     removal of uterine lining   TONSILLECTOMY      FAMILY HISTORY Family History  Problem Relation Age of Onset   Depression Son   The patient's mother will turn 63 on March 2021   GYNECOLOGIC HISTORY: Menarche age  13, first live birth age 79, she is [place] P3. She stopped having periods in 1999 after endometrial stripping. She continues on hormone replacement.   SOCIAL HISTORY: [name] is retired from [employer].  The patient and her husband are now living in [place].  Her son [name] lives in [place]; he works for a [employer]. A second son lives in [place] and has two children. Son [name] lives in [place]. She has 8 grandchildren, the oldest is 33 and the youngest is 94.  ADVANCED DIRECTIVES: In place   HEALTH MAINTENANCE: Social History   Tobacco Use   Smoking status: Never    Passive exposure: Never   Smokeless tobacco: Never  Vaping Use   Vaping Use: Never used  Substance Use Topics   Alcohol use: Yes    Comment: 1 shot liquor daily   Drug use: No     Colonoscopy: 12/2018 (Dr. Collene Mares), repeat in 10 years  PAP: December 2012/ [name]  Bone density: Due  Lipid panel: per Dr Drema Dallas  Allergies  Allergen Reactions   Penicillins Anaphylaxis    "stopped breathing" "drops my heartbeat" "I just pass out"   Lidocaine-Tetracaine-Epineph Other (See Comments)    Pt came in for CODE STROKE after receiving Lidocaine with Epi in dental office. No stroke, but had symptoms.     Current Outpatient Medications  Medication Sig Dispense Refill   acetaminophen (TYLENOL) 500 MG tablet Take 500-1,000 mg by mouth every 6 (six) hours as needed for headache.      albuterol (VENTOLIN HFA) 108 (90 Base) MCG/ACT inhaler Inhale  1-2 puffs into the lungs every 6 (six) hours as needed for shortness of breath or wheezing.     azelastine (ASTELIN) 0.1 % nasal spray Place into both nostrils 2 (two) times daily. Use in each nostril as directed     betamethasone dipropionate (DIPROLENE) 0.05 % ointment Apply topically 2 (two) times daily.     Calcium Citrate-Vitamin D 200-250 MG-UNIT TABS Take 1 tablet by mouth 4 (four) times daily.     Cholecalciferol (VITAMIN D-3) 125 MCG (5000 UT) TABS Take 2  tablets by mouth daily.     Estradiol (DIVIGEL) 0.5 MG/0.5GM GEL USE AS INSTRUCTED BY YOUR PRESCRIBER 90 each 3   Estradiol 1 MG/GM GEL Thinly apply contents of 1 complete foil packet to left or right upper thigh on alternating days.  Wash hands thoroughly after each application. 3 g 4   lidocaine-prilocaine (EMLA) cream Apply a nickel size amount to skin on top of port- cover with saran wrap at least 1 hour before access 30 g 0   Magnesium 200 MG TABS Take 2 tablets by mouth daily.     montelukast (SINGULAIR) 10 MG tablet Take 10 mg by mouth at bedtime.     progesterone (PROMETRIUM) 200 MG capsule TAKE 1 CAPSULE AT BEDTIME 90 capsule 3   traZODone (DESYREL) 100 MG tablet TAKE 1 TABLET AT BEDTIME 90 tablet 3   valACYclovir (VALTREX) 1000 MG tablet TAKE 1 TABLET BY MOUTH TWICE DAILY FOR 5 DAYS THEN TAKE 1 TABLET BY MOUTH DAILY 80 tablet 6   venetoclax (VENCLEXTA) 100 MG tablet TAKE 1 TABLET (100 MG) BY MOUTH DAILY. TAKE WITH FOOD AND WATER AT APPROXIMATELY THE SAME TIME EACH DAY. 30 tablet 6   Zinc 30 MG TABS Take 1 tablet by mouth daily.     Current Facility-Administered Medications  Medication Dose Route Frequency Provider Last Rate Last Admin   0.9 %  sodium chloride infusion   Intravenous PRN Causey, Charlestine Massed, NP        OBJECTIVE: white woman in no acute distress  There were no vitals filed for this visit.   Wt Readings from Last 3 Encounters:  11/22/21 126 lb (57.2 kg)  10/07/21 124 lb 9 oz (56.5 kg)  04/17/21 122 lb 1.6 oz (55.4 kg)   There is no height or weight on file to calculate BMI.    ECOG FS:1 - Symptomatic but completely ambulatory  Physical Exam Constitutional:      Appearance: Normal appearance.  Cardiovascular:     Rate and Rhythm: Normal rate and regular rhythm.     Pulses: Normal pulses.     Heart sounds: Normal heart sounds.  Pulmonary:     Effort: Pulmonary effort is normal.     Breath sounds: Normal breath sounds.  Abdominal:     General:  Abdomen is flat. Bowel sounds are normal. There is no distension.     Palpations: There is no mass.  Musculoskeletal:        General: No swelling or tenderness. Normal range of motion.     Cervical back: Normal range of motion and neck supple. No rigidity.  Lymphadenopathy:     Cervical: No cervical adenopathy.  Skin:    General: Skin is warm and dry.  Neurological:     General: No focal deficit present.     Mental Status: She is alert.  Psychiatric:        Mood and Affect: Mood normal.     LAB RESULTS:  Lab Results  Component Value Date   WBC 5.8 02/10/2022   NEUTROABS 3.8 02/10/2022   HGB 14.6 02/10/2022   HCT 40.4 02/10/2022   MCV 92.0 02/10/2022   PLT 142 (L) 02/10/2022      Chemistry      Component Value Date/Time   NA 138 10/07/2021 1119   NA 140 12/31/2016 1406   K 3.8 10/07/2021 1119   K 3.6 12/31/2016 1406   CL 107 10/07/2021 1119   CL 101 12/15/2011 1322   CO2 27 10/07/2021 1119   CO2 24 12/31/2016 1406   BUN 15 10/07/2021 1119   BUN 12.8 12/31/2016 1406   CREATININE 0.96 10/07/2021 1119   CREATININE 0.9 12/31/2016 1406      Component Value Date/Time   CALCIUM 8.9 10/07/2021 1119   CALCIUM 8.8 12/31/2016 1406   ALKPHOS 101 10/07/2021 1119   ALKPHOS 71 12/31/2016 1406   AST 26 10/07/2021 1119   AST 18 12/31/2016 1406   ALT 24 10/07/2021 1119   ALT 11 12/31/2016 1406   BILITOT 0.6 10/07/2021 1119   BILITOT 0.49 12/31/2016 1406      STUDIES: No results found.   ASSESSMENT: 71 y.o.  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 rigors 02/27/2014: received IV vancomycin and levaquin briefly, then levaquin po (pcn allergy)  (6) bendamustine/ rituximab for 6 cycles, completed 05/04/2014, with improvement but not normalization of the absolute lymphocyte count  (7) ibrutinib started 05/08/2014, currently at 280 mg daily. Stopped by patient in June, 2019 restarted in September, 2019, discontinued October 2020  (a) hepatitis B studies 03/06/2014 and 12/31/2016, negative  (b) ibrutinib restarted 01/06/2019 (at 280 mg daily) in conjunction with venetoclax  (c) ibrutinib held after 10/18/2019, now used as needed for wart control  (8) CT abd/pelvis 10/22/2018 shows retroperitoneal adenopathy, increased splenomegaly  (a) repeat CT scan of the neck chest abdomen and pelvis 07/02/2019, with resolution of the previously noted adenopathy and splenomegaly.  (9) started venetoclax 12/15/2018  (a) uneventful ramp-up to the 100 mg a day dose, then held at that dose due to symptoms  (b) repeat hepatitis panel 10/18/2019 again negative  (10) evusheld started march 2022, repeated Sept 2022   PLAN:  She is now on venetoclax alone for CLL but wondering if she can go back on Ibrutinib for wart control. In the past, she was continued on Ibrutinib for wart control after progressing in 2021. She had bad warts in the past and dont want to get back to that situation. Since last visit, she had COVID, took very long to recover. No concerns on physical exam today.  CBC today reviewed, no concerns for worsening CLL. RTC in 8 weeks with repeat labs  Sent a message to oral pharmacy to review indications and to see if we can get Ibrutinib again  Total encounter time 30 minutes.   *Total Encounter Time as defined by the  Centers for Medicare and Medicaid Services  includes, in addition to the face-to-face time of a patient visit (documented in the note above) non-face-to-face time: obtaining and reviewing outside history, ordering and reviewing medications, tests or procedures, care coordination (communications with other health care professionals or caregivers) and documentation in the medical record.

## 2022-02-12 ENCOUNTER — Ambulatory Visit: Payer: Medicare Other | Admitting: Hematology and Oncology

## 2022-02-12 ENCOUNTER — Other Ambulatory Visit: Payer: Medicare Other

## 2022-02-13 ENCOUNTER — Other Ambulatory Visit: Payer: Self-pay | Admitting: *Deleted

## 2022-02-13 ENCOUNTER — Other Ambulatory Visit (HOSPITAL_COMMUNITY): Payer: Self-pay

## 2022-02-13 DIAGNOSIS — C9110 Chronic lymphocytic leukemia of B-cell type not having achieved remission: Secondary | ICD-10-CM

## 2022-02-13 MED ORDER — IBRUTINIB 280 MG PO TABS: 280.0000 mg | ORAL_TABLET | Freq: Every day | ORAL | 0 refills | Status: DC

## 2022-02-13 NOTE — Telephone Encounter (Signed)
Oral Oncology Pharmacist Encounter  Patient is restarting imbruvica, prescription redirected to [facility] per patients wishes. She is on a low dose of venetoclax at this time.   Drema Halon, PharmD Hematology/Oncology Clinical Pharmacist [facility] [phone] 02/13/2022 10:40 AM

## 2022-02-27 ENCOUNTER — Other Ambulatory Visit (HOSPITAL_COMMUNITY): Payer: Self-pay

## 2022-03-17 ENCOUNTER — Ambulatory Visit
Admission: RE | Admit: 2022-03-17 | Discharge: 2022-03-17 | Disposition: A | Discharge status: Home / Self Care | Payer: Medicare Other | Source: Ambulatory Visit | Attending: Internal Medicine | Admitting: Internal Medicine

## 2022-03-17 ENCOUNTER — Other Ambulatory Visit: Payer: Self-pay | Admitting: *Deleted

## 2022-03-17 DIAGNOSIS — I700 Atherosclerosis of aorta: Secondary | ICD-10-CM | POA: Diagnosis not present

## 2022-03-17 DIAGNOSIS — C9110 Chronic lymphocytic leukemia of B-cell type not having achieved remission: Secondary | ICD-10-CM

## 2022-03-17 DIAGNOSIS — E785 Hyperlipidemia, unspecified: Secondary | ICD-10-CM

## 2022-03-17 MED ORDER — MONTELUKAST SODIUM 10 MG PO TABS: 10.0000 mg | ORAL_TABLET | Freq: Every day | ORAL | 1 refills | Status: DC

## 2022-04-03 DIAGNOSIS — E785 Hyperlipidemia, unspecified: Secondary | ICD-10-CM | POA: Diagnosis not present

## 2022-04-07 ENCOUNTER — Inpatient Hospital Stay (HOSPITAL_BASED_OUTPATIENT_CLINIC_OR_DEPARTMENT_OTHER): Payer: Medicare Other | Admitting: Hematology and Oncology

## 2022-04-07 ENCOUNTER — Inpatient Hospital Stay: Payer: Medicare Other | Attending: Hematology and Oncology

## 2022-04-07 ENCOUNTER — Other Ambulatory Visit: Payer: Self-pay

## 2022-04-07 ENCOUNTER — Encounter: Payer: Self-pay | Admitting: Hematology and Oncology

## 2022-04-07 DIAGNOSIS — C9110 Chronic lymphocytic leukemia of B-cell type not having achieved remission: Secondary | ICD-10-CM | POA: Insufficient documentation

## 2022-04-07 DIAGNOSIS — R197 Diarrhea, unspecified: Secondary | ICD-10-CM | POA: Insufficient documentation

## 2022-04-07 DIAGNOSIS — Z95828 Presence of other vascular implants and grafts: Secondary | ICD-10-CM

## 2022-04-07 DIAGNOSIS — I700 Atherosclerosis of aorta: Secondary | ICD-10-CM | POA: Diagnosis not present

## 2022-04-07 LAB — CMP (CANCER CENTER ONLY)
ALT: 24 U/L (ref 0–44)
AST: 23 U/L (ref 15–41)
Albumin: 4 g/dL (ref 3.5–5.0)
Alkaline Phosphatase: 84 U/L (ref 38–126)
Anion gap: 7 (ref 5–15)
BUN: 14 mg/dL (ref 8–23)
CO2: 27 mmol/L (ref 22–32)
Calcium: 9.2 mg/dL (ref 8.9–10.3)
Chloride: 106 mmol/L (ref 98–111)
Creatinine: 0.8 mg/dL (ref 0.44–1.00)
GFR, Estimated: >60 mL/min (ref >60)
Glucose, Bld: 95 mg/dL (ref 70–99)
Potassium: 3.8 mmol/L (ref 3.5–5.1)
Sodium: 140 mmol/L (ref 135–145)
Total Bilirubin: 0.7 mg/dL (ref 0.3–1.2)
Total Protein: 5.8 g/dL — ABNORMAL LOW (ref 6.5–8.1)

## 2022-04-07 LAB — CBC WITH DIFFERENTIAL (CANCER CENTER ONLY)
Abs Immature Granulocytes: 0.03 10*3/uL (ref 0.00–0.07)
Basophils Absolute: 0 10*3/uL (ref 0.0–0.1)
Basophils Relative: 1 %
Eosinophils Absolute: 0 10*3/uL (ref 0.0–0.5)
Eosinophils Relative: 0 %
HCT: 40.8 % (ref 36.0–46.0)
Hemoglobin: 14.5 g/dL (ref 12.0–15.0)
Immature Granulocytes: 1 %
Lymphocytes Relative: 23 %
Lymphs Abs: 1.2 10*3/uL (ref 0.7–4.0)
MCH: 33 pg (ref 26.0–34.0)
MCHC: 35.5 g/dL (ref 30.0–36.0)
MCV: 92.9 fL (ref 80.0–100.0)
Monocytes Absolute: 0.6 10*3/uL (ref 0.1–1.0)
Monocytes Relative: 10 %
Neutro Abs: 3.5 10*3/uL (ref 1.7–7.7)
Neutrophils Relative %: 65 %
Platelet Count: 117 10*3/uL — ABNORMAL LOW (ref 150–400)
RBC: 4.39 MIL/uL (ref 3.87–5.11)
RDW: 11.7 % (ref 11.5–15.5)
WBC Count: 5.4 10*3/uL (ref 4.0–10.5)
nRBC: 0 % (ref 0.0–0.2)

## 2022-04-07 LAB — LACTATE DEHYDROGENASE: LDH: 162 U/L (ref 98–192)

## 2022-04-07 MED ORDER — HEPARIN SOD (PORK) LOCK FLUSH 100 UNIT/ML IV SOLN
500.0000 [IU] | Freq: Once | INTRAVENOUS | Status: AC
  Administered 2022-04-07: 500 [IU]

## 2022-04-07 MED ORDER — VENETOCLAX 100 MG PO TABS: ORAL_TABLET | ORAL | 6 refills | Status: DC

## 2022-04-07 MED ORDER — IBRUTINIB 280 MG PO TABS: 280.0000 mg | ORAL_TABLET | Freq: Every day | ORAL | 6 refills | Status: DC

## 2022-04-07 MED ORDER — SODIUM CHLORIDE 0.9% FLUSH
10.0000 mL | Freq: Once | INTRAVENOUS | Status: AC
  Administered 2022-04-07: 10 mL

## 2022-04-07 NOTE — Progress Notes (Signed)
[place]  [phone]) [phone] [phone]) [phone]     ID: Evan Verney Fiveash   DOB: [birth year]  MR#: [id number]  [id number]  Patient Care Team: Leeroy Cha, MD as PCP - General (Internal Medicine) Magrinat, Virgie Dad, MD (Inactive) as Consulting Physician (Oncology) [name], MD as Consulting Physician (Obstetrics and Gynecology) Vallarie Mare, MD as Consulting Physician (Neurosurgery) OTHER MD:  CHIEF COMPLAINT: Chronic lymphocytic leukemia  CURRENT TREATMENT: venetoclax,   INTERVAL HISTORY:  Rachel [name] returns today for follow-up of her chronic lymphocytic leukemia/small-lymphocytic lymphoma.   She is on combintion of Ibrutinib and venetoclax. 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 She is also on rosuvastatin for hyperlipidemia, she will be doing a repeat lipid panel soon. She says she has been drinking too much, about 3 drinks of tequila, but has been cutting it down.  Rest of the pertinent 10 point ROS reviewed and negative.   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 She has been treated with Rituxan, ofatumumab, cladribine, bendamustine and currently with ibrutinib, with details summarized below.   PAST MEDICAL HISTORY: 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   Diverticulitis    Diverticulitis 06/25/2015   Leukemia ([place])    Lower back pain     PAST SURGICAL HISTORY: Past Surgical History:  Procedure Laterality Date   BREAST SURGERY  2015   left breast bx   BUNIONECTOMY     right foot   lining of uterus removed     OTHER SURGICAL  HISTORY     removal of uterine lining   TONSILLECTOMY      FAMILY HISTORY Family History  Problem Relation Age of Onset   Depression Son   The patient's mother will turn 36 on March 2021   GYNECOLOGIC HISTORY: Menarche age 50, first live birth age 75, she is [place] P3. She stopped having periods in 1999 after endometrial stripping. She continues on hormone replacement.   SOCIAL HISTORY: [name] is retired from [employer].  The patient and her husband are now living in [place].  Her son [name] lives in [place]; he works for a [employer]. A second son lives in [place] and has two children. Son [name] lives in [place]. She has 8 grandchildren, the oldest is 66 and the youngest is 38.  ADVANCED DIRECTIVES: In place   HEALTH MAINTENANCE: Social History   Tobacco Use   Smoking status: Never    Passive exposure: Never   Smokeless tobacco: Never  Vaping Use   Vaping Use: Never used  Substance Use Topics   Alcohol use: Yes    Comment: 1 shot liquor daily   Drug use: No     Colonoscopy: 12/2018 (Dr. Collene Mares), repeat in 10 years  PAP: December 2012/ [name]  Bone density: Due  Lipid panel: per Dr Drema Dallas  Allergies  Allergen Reactions   Penicillins Anaphylaxis    "stopped breathing" "drops my heartbeat" "I just pass out"   Lidocaine-Tetracaine-Epineph Other (See Comments)    Pt came in for CODE STROKE after receiving Lidocaine with Epi in dental office. No stroke, but had symptoms.     Current  Outpatient Medications  Medication Sig Dispense Refill   acetaminophen (TYLENOL) 500 MG tablet Take 500-1,000 mg by mouth every 6 (six) hours as needed for headache.      albuterol (VENTOLIN HFA) 108 (90 Base) MCG/ACT inhaler Inhale 1-2 puffs into the lungs every 6 (six) hours as needed for shortness of breath or wheezing.     azelastine (ASTELIN) 0.1 % nasal spray Place into both nostrils 2 (two) times daily. Use in each nostril as directed     betamethasone  dipropionate (DIPROLENE) 0.05 % ointment Apply topically 2 (two) times daily.     Calcium Citrate-Vitamin D 200-250 MG-UNIT TABS Take 1 tablet by mouth 4 (four) times daily.     Cholecalciferol (VITAMIN D-3) 125 MCG (5000 UT) TABS Take 2 tablets by mouth daily.     Estradiol (DIVIGEL) 0.5 MG/0.5GM GEL USE AS INSTRUCTED BY YOUR PRESCRIBER 90 each 3   Estradiol 1 MG/GM GEL Thinly apply contents of 1 complete foil packet to left or right upper thigh on alternating days.  Wash hands thoroughly after each application. 3 g 4   ibrutinib (IMBRUVICA) 280 MG tablet Take 1 tablet (280 mg total) by mouth daily. Take with a glass of water. 56 tablet 0   lidocaine-prilocaine (EMLA) cream Apply a nickel size amount to skin on top of port- cover with saran wrap at least 1 hour before access 30 g 0   Magnesium 200 MG TABS Take 2 tablets by mouth daily.     montelukast (SINGULAIR) 10 MG tablet Take 1 tablet (10 mg total) by mouth at bedtime. 30 tablet 1   progesterone (PROMETRIUM) 200 MG capsule TAKE 1 CAPSULE AT BEDTIME 90 capsule 3   traZODone (DESYREL) 100 MG tablet TAKE 1 TABLET AT BEDTIME 90 tablet 3   valACYclovir (VALTREX) 1000 MG tablet TAKE 1 TABLET BY MOUTH TWICE DAILY FOR 5 DAYS THEN TAKE 1 TABLET BY MOUTH DAILY 80 tablet 6   venetoclax (VENCLEXTA) 100 MG tablet TAKE 1 TABLET (100 MG) BY MOUTH DAILY. TAKE WITH FOOD AND WATER AT APPROXIMATELY THE SAME TIME EACH DAY. 30 tablet 6   Zinc 30 MG TABS Take 1 tablet by mouth daily.     Current Facility-Administered Medications  Medication Dose Route Frequency Provider Last Rate Last Admin   0.9 %  sodium chloride infusion   Intravenous PRN Causey, Charlestine Massed, NP        OBJECTIVE: white woman in no acute distress  Vitals:   04/07/22 1305  BP: 116/63  Pulse: 79  Resp: 16  Temp: 97.9 F (36.6 C)  SpO2: 98%     Wt Readings from Last 3 Encounters:  04/07/22 123 lb 6.4 oz (56 kg)  02/10/22 122 lb 8 oz (55.6 kg)  11/22/21 126 lb (57.2 kg)    Body mass index is 26.7 kg/m.    ECOG FS:1 - Symptomatic but completely ambulatory  Physical Exam Constitutional:      Appearance: Normal appearance.  Cardiovascular:     Rate and Rhythm: Normal rate and regular rhythm.     Pulses: Normal pulses.     Heart sounds: Normal heart sounds.  Pulmonary:     Effort: Pulmonary effort is normal.     Breath sounds: Normal breath sounds.  Abdominal:     General: Abdomen is flat. Bowel sounds are normal. There is no distension.     Palpations: There is no mass.  Musculoskeletal:        General: No swelling or tenderness. Normal  range of motion.     Cervical back: Normal range of motion and neck supple. No rigidity.  Lymphadenopathy:     Cervical: No cervical adenopathy.  Skin:    General: Skin is warm and dry.  Neurological:     General: No focal deficit present.     Mental Status: She is alert.  Psychiatric:        Mood and Affect: Mood normal.     LAB RESULTS:  Lab Results  Component Value Date   WBC 5.4 04/07/2022   NEUTROABS 3.5 04/07/2022   HGB 14.5 04/07/2022   HCT 40.8 04/07/2022   MCV 92.9 04/07/2022   PLT 117 (L) 04/07/2022      Chemistry      Component Value Date/Time   NA 140 02/10/2022 1219   NA 140 12/31/2016 1406   K 3.8 02/10/2022 1219   K 3.6 12/31/2016 1406   CL 105 02/10/2022 1219   CL 101 12/15/2011 1322   CO2 28 02/10/2022 1219   CO2 24 12/31/2016 1406   BUN 16 02/10/2022 1219   BUN 12.8 12/31/2016 1406   CREATININE 0.85 02/10/2022 1219   CREATININE 0.9 12/31/2016 1406      Component Value Date/Time   CALCIUM 9.7 02/10/2022 1219   CALCIUM 8.8 12/31/2016 1406   ALKPHOS 88 02/10/2022 1219   ALKPHOS 71 12/31/2016 1406   AST 22 02/10/2022 1219   AST 18 12/31/2016 1406   ALT 22 02/10/2022 1219   ALT 11 12/31/2016 1406   BILITOT 0.6 02/10/2022 1219   BILITOT 0.49 12/31/2016 1406      STUDIES: CT CARDIAC SCORING ([facility] LOCATIONS ONLY)  Result Date: 03/17/2022 CLINICAL DATA:  Elevated  lipids. History of leukemia in remission. Ongoing oral chemotherapy. * Tracking Code: [place] * EXAM: CT CARDIAC CORONARY ARTERY CALCIUM SCORE TECHNIQUE: Non-contrast imaging through the heart was performed using prospective ECG gating. Image post processing was performed on an independent workstation, allowing for quantitative analysis of the heart and coronary arteries. Note that this exam targets the heart and the chest was not imaged in its entirety. COMPARISON:  07/02/2019 chest CT FINDINGS: CORONARY CALCIUM SCORES: Left Main: 0 LAD: 11.2 LCx: 0 RCA: 0 Total Agatston Score: 11.2 MESA database percentile: 40 AORTA MEASUREMENTS: Ascending Aorta: 2.9 cm Descending Aorta:2.5 cm OTHER FINDINGS: No pleural fluid. Mild air trapping in the lower lobes presumably secondary to hypoventilation. Central line terminates in the mid right atrium. Aortic atherosclerosis. Normal heart size, without pericardial effusion. No imaged thoracic adenopathy. Normal imaged portions of the liver, spleen, stomach. Lower thoracic moderate to severe compression deformity is nonacute without significant ventral canal encroachment. Present on 12/16/2019 plain film. IMPRESSION: 1. Total Agatston score of 11.2, corresponding to 40th percentile for age, sex, and race based cohort. 2.  Aortic Atherosclerosis (ICD10-I70.0). Electronically Signed   By: Abigail Miyamoto M.D.   On: 03/17/2022 12:37     ASSESSMENT: 72 y.o.  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 rigors 02/27/2014: received IV vancomycin and levaquin briefly, then levaquin po (pcn allergy)  (6) bendamustine/ rituximab for 6 cycles, completed 05/04/2014, with improvement but not normalization of the absolute lymphocyte count  (7) ibrutinib started 05/08/2014, currently at 280 mg daily. Stopped by patient in June, 2019 restarted in September, 2019, discontinued October 2020  (a) hepatitis B studies 03/06/2014 and 12/31/2016, negative  (b) ibrutinib restarted 01/06/2019 (at 280 mg daily) in conjunction with venetoclax  (c) ibrutinib held after 10/18/2019, now used as needed for wart control  (8) CT abd/pelvis 10/22/2018 shows retroperitoneal adenopathy, increased splenomegaly  (a) repeat CT scan of the neck chest abdomen and pelvis 07/02/2019, with resolution of the previously noted adenopathy and splenomegaly.  (9) started venetoclax 12/15/2018  (a) uneventful ramp-up to the 100 mg a day dose, then held at that dose due to symptoms  (b) repeat hepatitis panel 10/18/2019 again negative  (10) evusheld started march 2022, repeated Sept 2022   PLAN:  She is now on venetoclax alone for CLL but went on Ibrutinib again along with venetoclax for wart control. She is doing well with the combination except for mild diarrhea. She takes imodium PRN No concerns on physical exam today.  CBC today reviewed, no concerns for worsening CLL. We have today once again discussed about cutting down on alcohol consumption. She will continue to work on it. RTC in 12  weeks with repeat labs    Total encounter time 30 minutes.   *Total Encounter Time as defined by the Centers for Medicare and Medicaid Services includes, in addition to the face-to-face time of a patient visit (documented in the note above) non-face-to-face time: obtaining and reviewing  outside history, ordering and reviewing medications, tests or procedures, care coordination (communications with other health care professionals or caregivers) and documentation in the medical record.

## 2022-04-21 ENCOUNTER — Telehealth: Payer: Self-pay | Admitting: Hematology and Oncology

## 2022-04-21 NOTE — Telephone Encounter (Signed)
Rescheduled patient per [place] out of office. Patient aware of date and time of appointment.

## 2022-04-28 ENCOUNTER — Telehealth: Payer: Self-pay | Admitting: *Deleted

## 2022-04-30 ENCOUNTER — Encounter: Payer: Self-pay | Admitting: Oncology

## 2022-04-30 NOTE — Telephone Encounter (Signed)
On 04/28/2022 this RN spoke with pt per her call stating continued mouth sores- she states they are on the inside of her gums and occasionally on her tongue.  "One sore will get better then another one pops up"  This RN verified pt is taking Valtrex '1000mg'$  daily.  Per review with MD - this RN informed pt to increase the Valtrex to twice a day for 5 days - with request for pt to call this RN on 3/8 with update.  This RN also reviewed home remedy of using coconut oil (solid form) and to swish with it several times a day.  Pt verbalized understanding.

## 2022-05-20 DIAGNOSIS — R42 Dizziness and giddiness: Secondary | ICD-10-CM | POA: Diagnosis not present

## 2022-05-20 DIAGNOSIS — J019 Acute sinusitis, unspecified: Secondary | ICD-10-CM | POA: Diagnosis not present

## 2022-05-20 DIAGNOSIS — H6692 Otitis media, unspecified, left ear: Secondary | ICD-10-CM | POA: Diagnosis not present

## 2022-05-26 ENCOUNTER — Other Ambulatory Visit: Payer: Self-pay | Admitting: *Deleted

## 2022-05-26 MED ORDER — VALACYCLOVIR HCL 1 G PO TABS: ORAL_TABLET | ORAL | 0 refills | Status: DC

## 2022-05-27 ENCOUNTER — Other Ambulatory Visit: Payer: Self-pay | Admitting: *Deleted

## 2022-07-04 DIAGNOSIS — D84821 Immunodeficiency due to drugs: Secondary | ICD-10-CM | POA: Diagnosis not present

## 2022-07-04 DIAGNOSIS — R1032 Left lower quadrant pain: Secondary | ICD-10-CM | POA: Diagnosis not present

## 2022-07-04 DIAGNOSIS — C9110 Chronic lymphocytic leukemia of B-cell type not having achieved remission: Secondary | ICD-10-CM | POA: Diagnosis not present

## 2022-07-08 ENCOUNTER — Other Ambulatory Visit: Payer: Medicare Other

## 2022-07-08 ENCOUNTER — Ambulatory Visit: Payer: Medicare Other | Admitting: Hematology and Oncology

## 2022-07-14 ENCOUNTER — Other Ambulatory Visit: Payer: Self-pay | Admitting: *Deleted

## 2022-07-14 DIAGNOSIS — C9110 Chronic lymphocytic leukemia of B-cell type not having achieved remission: Secondary | ICD-10-CM

## 2022-07-15 ENCOUNTER — Encounter: Payer: Self-pay | Admitting: Hematology and Oncology

## 2022-07-15 ENCOUNTER — Inpatient Hospital Stay: Payer: Medicare Other | Attending: Hematology and Oncology

## 2022-07-15 ENCOUNTER — Inpatient Hospital Stay (HOSPITAL_BASED_OUTPATIENT_CLINIC_OR_DEPARTMENT_OTHER): Payer: Medicare Other | Admitting: Hematology and Oncology

## 2022-07-15 ENCOUNTER — Other Ambulatory Visit: Payer: Self-pay

## 2022-07-15 ENCOUNTER — Other Ambulatory Visit: Payer: Self-pay | Admitting: *Deleted

## 2022-07-15 VITALS — BP 94/40 | HR 65 | Temp 97.5°F | Resp 16 | Ht <= 58 in | Wt 122.4 lb

## 2022-07-15 DIAGNOSIS — Z95828 Presence of other vascular implants and grafts: Secondary | ICD-10-CM

## 2022-07-15 DIAGNOSIS — D649 Anemia, unspecified: Secondary | ICD-10-CM | POA: Insufficient documentation

## 2022-07-15 DIAGNOSIS — C9110 Chronic lymphocytic leukemia of B-cell type not having achieved remission: Secondary | ICD-10-CM

## 2022-07-15 DIAGNOSIS — R197 Diarrhea, unspecified: Secondary | ICD-10-CM | POA: Diagnosis not present

## 2022-07-15 LAB — CMP (CANCER CENTER ONLY)
ALT: 20 U/L (ref 0–44)
AST: 24 U/L (ref 15–41)
Albumin: 4.1 g/dL (ref 3.5–5.0)
Alkaline Phosphatase: 91 U/L (ref 38–126)
Anion gap: 6 (ref 5–15)
BUN: 16 mg/dL (ref 8–23)
CO2: 28 mmol/L (ref 22–32)
Calcium: 8.7 mg/dL — ABNORMAL LOW (ref 8.9–10.3)
Chloride: 107 mmol/L (ref 98–111)
Creatinine: 0.83 mg/dL (ref 0.44–1.00)
GFR, Estimated: >60 mL/min (ref >60)
Glucose, Bld: 92 mg/dL (ref 70–99)
Potassium: 3.9 mmol/L (ref 3.5–5.1)
Sodium: 141 mmol/L (ref 135–145)
Total Bilirubin: 0.6 mg/dL (ref 0.3–1.2)
Total Protein: 6 g/dL — ABNORMAL LOW (ref 6.5–8.1)

## 2022-07-15 LAB — CBC WITH DIFFERENTIAL (CANCER CENTER ONLY)
Abs Immature Granulocytes: 0.03 10*3/uL (ref 0.00–0.07)
Basophils Absolute: 0 10*3/uL (ref 0.0–0.1)
Basophils Relative: 0 %
Eosinophils Absolute: 0 10*3/uL (ref 0.0–0.5)
Eosinophils Relative: 0 %
HCT: 40.5 % (ref 36.0–46.0)
Hemoglobin: 13.7 g/dL (ref 12.0–15.0)
Immature Granulocytes: 1 %
Lymphocytes Relative: 25 %
Lymphs Abs: 1.2 10*3/uL (ref 0.7–4.0)
MCH: 32.4 pg (ref 26.0–34.0)
MCHC: 33.8 g/dL (ref 30.0–36.0)
MCV: 95.7 fL (ref 80.0–100.0)
Monocytes Absolute: 0.5 10*3/uL (ref 0.1–1.0)
Monocytes Relative: 10 %
Neutro Abs: 3 10*3/uL (ref 1.7–7.7)
Neutrophils Relative %: 64 %
Platelet Count: 129 10*3/uL — ABNORMAL LOW (ref 150–400)
RBC: 4.23 MIL/uL (ref 3.87–5.11)
RDW: 12 % (ref 11.5–15.5)
WBC Count: 4.7 10*3/uL (ref 4.0–10.5)
nRBC: 0 % (ref 0.0–0.2)

## 2022-07-15 LAB — LACTATE DEHYDROGENASE: LDH: 155 U/L (ref 98–192)

## 2022-07-15 MED ORDER — SODIUM CHLORIDE 0.9% FLUSH
10.0000 mL | Freq: Once | INTRAVENOUS | Status: AC
  Administered 2022-07-15: 10 mL

## 2022-07-15 MED ORDER — HEPARIN SOD (PORK) LOCK FLUSH 100 UNIT/ML IV SOLN
500.0000 [IU] | Freq: Once | INTRAVENOUS | Status: AC
  Administered 2022-07-15: 500 [IU]

## 2022-07-15 MED ORDER — MONTELUKAST SODIUM 10 MG PO TABS: 10.0000 mg | ORAL_TABLET | Freq: Every day | ORAL | 3 refills | Status: DC

## 2022-07-15 NOTE — Progress Notes (Signed)
[facility]  Telephone:(336) [phone] Fax:(336) [phone]     ID: Rachel [name]   DOB: [birth year]  MR#: [id number]  JYN#:[id number]  Patient Care Team: Lorenda Ishihara, MD as PCP - General (Internal Medicine) Magrinat, Valentino Hue, MD (Inactive) as Consulting Physician (Oncology) Brock Bad, MD as Consulting Physician (Obstetrics and Gynecology) Bedelia Person, MD as Consulting Physician (Neurosurgery) OTHER MD:  CHIEF COMPLAINT: Chronic lymphocytic leukemia  CURRENT TREATMENT: venetoclax,   INTERVAL HISTORY:  Rachel [name] returns today for follow-up of her chronic lymphocytic leukemia/small-lymphocytic lymphoma.   She is on combintion of Ibrutinib and venetoclax. 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 She is doing very well on this combination. Since last visit, she had vertigo when she went to [facility] and this episode lasted for 5 days.   Rest of the pertinent 10 point ROS reviewed and negative.   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 She has been treated with Rituxan, ofatumumab, cladribine, bendamustine and currently with ibrutinib, with details summarized below.   PAST MEDICAL HISTORY: 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   Diverticulitis    Diverticulitis 06/25/2015   Leukemia (HCC)    Lower back pain     PAST SURGICAL HISTORY: Past Surgical History:  Procedure Laterality Date   BREAST SURGERY  2015   left breast bx   BUNIONECTOMY     right foot   lining of uterus removed     OTHER SURGICAL HISTORY     removal of uterine lining    TONSILLECTOMY      FAMILY HISTORY Family History  Problem Relation Age of Onset   Depression Son   The patient's mother will turn 80 on March 2021   GYNECOLOGIC HISTORY: Menarche age 41, first live birth age 50, she is GX P3. She stopped having periods in 1999 after endometrial stripping. She continues on hormone replacement.   SOCIAL HISTORY: Her husband [name] is retired from [employer].  The patient and her husband are now living in [place].  Her son [name] lives in [place]; he works for a [employer]. A second son lives in [place] and has two children. Son [name] lives in [place]. She has 8 grandchildren, the oldest is 16 and the youngest is 6.  ADVANCED DIRECTIVES: In place   HEALTH MAINTENANCE: Social History   Tobacco Use   Smoking status: Never    Passive exposure: Never   Smokeless tobacco: Never  Vaping Use   Vaping Use: Never used  Substance Use Topics   Alcohol use: Yes    Comment: 1 shot liquor daily   Drug use: No     Colonoscopy: 12/2018 (Dr. Loreta Ave), repeat in 10 years  PAP: December 2012/ [name]  Bone density: Due  Lipid panel: per Dr Zachery Dauer  Allergies  Allergen Reactions   Penicillins Anaphylaxis    "stopped breathing" "drops my heartbeat" "I just pass out"   Lidocaine-Tetracaine-Epineph Other (See Comments)    Pt came in for CODE STROKE after receiving Lidocaine with Epi in dental office. No stroke, but had symptoms.     Current Outpatient Medications  Medication Sig Dispense Refill  acetaminophen (TYLENOL) 500 MG tablet Take 500-1,000 mg by mouth every 6 (six) hours as needed for headache.      albuterol (VENTOLIN HFA) 108 (90 Base) MCG/ACT inhaler Inhale 1-2 puffs into the lungs every 6 (six) hours as needed for shortness of breath or wheezing.     azelastine (ASTELIN) 0.1 % nasal spray Place into both nostrils 2 (two) times daily. Use in each nostril as directed     betamethasone dipropionate (DIPROLENE) 0.05 % ointment Apply  topically 2 (two) times daily.     Calcium Citrate-Vitamin D 200-250 MG-UNIT TABS Take 1 tablet by mouth 4 (four) times daily.     Cholecalciferol (VITAMIN D-3) 125 MCG (5000 UT) TABS Take 2 tablets by mouth daily.     ibrutinib (IMBRUVICA) 280 MG tablet Take 1 tablet (280 mg total) by mouth daily. Take with a glass of water. 56 tablet 6   lidocaine-prilocaine (EMLA) cream Apply a nickel size amount to skin on top of port- cover with saran wrap at least 1 hour before access 30 g 0   Magnesium 200 MG TABS Take 2 tablets by mouth daily.     montelukast (SINGULAIR) 10 MG tablet Take 1 tablet (10 mg total) by mouth at bedtime. 90 tablet 3   traZODone (DESYREL) 100 MG tablet TAKE 1 TABLET AT BEDTIME 90 tablet 3   valACYclovir (VALTREX) 1000 MG tablet TAKE 1 TABLET BY MOUTH TWICE DAILY FOR 5 DAYS THEN TAKE 1 TABLET BY MOUTH DAILY 80 tablet 0   venetoclax (VENCLEXTA) 100 MG tablet TAKE 1 TABLET (100 MG) BY MOUTH DAILY. TAKE WITH FOOD AND WATER AT APPROXIMATELY THE SAME TIME EACH DAY. 30 tablet 6   Zinc 30 MG TABS Take 1 tablet by mouth daily.     Current Facility-Administered Medications  Medication Dose Route Frequency Provider Last Rate Last Admin   0.9 %  sodium chloride infusion   Intravenous PRN Causey, Larna Daughters, NP        OBJECTIVE: white woman in no acute distress  Vitals:   07/15/22 1059  BP: (!) 94/40  Pulse: 65  Resp: 16  Temp: (!) 97.5 F (36.4 C)  SpO2: 99%     Wt Readings from Last 3 Encounters:  07/15/22 122 lb 7 oz (55.5 kg)  04/07/22 123 lb 6.4 oz (56 kg)  02/10/22 122 lb 8 oz (55.6 kg)   Body mass index is 26.5 kg/m.    ECOG FS:1 - Symptomatic but completely ambulatory  Physical Exam Constitutional:      Appearance: Normal appearance.  Cardiovascular:     Rate and Rhythm: Normal rate and regular rhythm.     Pulses: Normal pulses.     Heart sounds: Normal heart sounds.  Pulmonary:     Effort: Pulmonary effort is normal.     Breath sounds: Normal  breath sounds.  Abdominal:     General: Abdomen is flat. Bowel sounds are normal. There is no distension.     Palpations: There is no mass.  Musculoskeletal:        General: No swelling or tenderness. Normal range of motion.     Cervical back: Normal range of motion and neck supple. No rigidity.  Lymphadenopathy:     Cervical: No cervical adenopathy.  Skin:    General: Skin is warm and dry.  Neurological:     General: No focal deficit present.     Mental Status: She is alert.  Psychiatric:        Mood  and Affect: Mood normal.     LAB RESULTS:  Lab Results  Component Value Date   WBC 4.7 07/15/2022   NEUTROABS 3.0 07/15/2022   HGB 13.7 07/15/2022   HCT 40.5 07/15/2022   MCV 95.7 07/15/2022   PLT 129 (L) 07/15/2022      Chemistry      Component Value Date/Time   NA 140 04/07/2022 1214   NA 140 12/31/2016 1406   K 3.8 04/07/2022 1214   K 3.6 12/31/2016 1406   CL 106 04/07/2022 1214   CL 101 12/15/2011 1322   CO2 27 04/07/2022 1214   CO2 24 12/31/2016 1406   BUN 14 04/07/2022 1214   BUN 12.8 12/31/2016 1406   CREATININE 0.80 04/07/2022 1214   CREATININE 0.9 12/31/2016 1406      Component Value Date/Time   CALCIUM 9.2 04/07/2022 1214   CALCIUM 8.8 12/31/2016 1406   ALKPHOS 84 04/07/2022 1214   ALKPHOS 71 12/31/2016 1406   AST 23 04/07/2022 1214   AST 18 12/31/2016 1406   ALT 24 04/07/2022 1214   ALT 11 12/31/2016 1406   BILITOT 0.7 04/07/2022 1214   BILITOT 0.49 12/31/2016 1406      STUDIES: No results found.   ASSESSMENT: 72 y.o.  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 rigors 02/27/2014: received IV vancomycin and levaquin briefly, then levaquin po (pcn allergy)  (6) bendamustine/ rituximab for 6 cycles, completed 05/04/2014, with improvement but not normalization of the absolute lymphocyte count  (7) ibrutinib started 05/08/2014, currently at 280 mg daily. Stopped by patient in June, 2019 restarted in September, 2019, discontinued October 2020  (a) hepatitis B studies 03/06/2014 and 12/31/2016, negative  (b) ibrutinib restarted 01/06/2019 (at 280 mg daily) in conjunction with venetoclax  (c) ibrutinib held after 10/18/2019, now used as needed for wart control  (8) CT abd/pelvis 10/22/2018 shows retroperitoneal adenopathy, increased splenomegaly  (a) repeat CT scan of the neck chest abdomen and pelvis 07/02/2019, with resolution of the previously noted adenopathy and splenomegaly.  (9) started venetoclax 12/15/2018  (a) uneventful ramp-up to the 100 mg a day dose, then held at that dose due to symptoms  (b) repeat hepatitis panel 10/18/2019 again negative  (10) evusheld started march 2022, repeated Sept 2022   PLAN:  She is now on venetoclax alone and Ibrutinib , ibrutinib is for wart control. She is doing well with the combination except for mild diarrhea. She takes imodium PRN No concerns on physical exam today.   CBC today reviewed, no concerns for worsening CLL. RTC in 12  weeks with repeat labs    Total encounter time 20 minutes.   *Total Encounter Time as defined by the Centers for Medicare and Medicaid Services includes, in addition to the face-to-face time of a patient visit (documented in the note above) non-face-to-face time: obtaining and reviewing outside history,  ordering and reviewing medications, tests or procedures, care coordination (communications with  other health care professionals or caregivers) and documentation in the medical record.

## 2022-09-05 ENCOUNTER — Telehealth: Payer: Self-pay | Admitting: *Deleted

## 2022-09-05 NOTE — Telephone Encounter (Signed)
Pt called to state recurrence of sores in mouth - on gums and tongue which started on Sunday - she is using orajel and salt water rinses with little benefit.  She is using valtrex 1 tablet daily for maintenance therapy.  In the past she has increased dosage of valtrex to bid x 1 week with good outcome.  Pt will increase valtrex as above - will also do coconut oil rinses at least two x a day for comfort as well.  Pt will call if symptoms do not improve

## 2022-09-19 ENCOUNTER — Telehealth: Payer: Self-pay | Admitting: Hematology and Oncology

## 2022-10-14 DIAGNOSIS — H1131 Conjunctival hemorrhage, right eye: Secondary | ICD-10-CM | POA: Diagnosis not present

## 2022-10-17 ENCOUNTER — Other Ambulatory Visit: Payer: Medicare Other

## 2022-10-17 ENCOUNTER — Ambulatory Visit: Payer: Medicare Other | Admitting: Hematology and Oncology

## 2022-10-21 ENCOUNTER — Telehealth: Payer: Self-pay | Admitting: Hematology and Oncology

## 2022-10-29 DIAGNOSIS — Z1231 Encounter for screening mammogram for malignant neoplasm of breast: Secondary | ICD-10-CM | POA: Diagnosis not present

## 2022-11-05 ENCOUNTER — Encounter: Payer: Self-pay | Admitting: Hematology and Oncology

## 2022-11-17 ENCOUNTER — Ambulatory Visit: Payer: Medicare Other | Admitting: Hematology and Oncology

## 2022-11-17 ENCOUNTER — Other Ambulatory Visit: Payer: Medicare Other

## 2022-11-17 ENCOUNTER — Other Ambulatory Visit: Payer: Self-pay | Admitting: *Deleted

## 2022-11-17 ENCOUNTER — Other Ambulatory Visit: Payer: Self-pay

## 2022-11-17 DIAGNOSIS — C9110 Chronic lymphocytic leukemia of B-cell type not having achieved remission: Secondary | ICD-10-CM

## 2022-11-17 MED ORDER — VENETOCLAX 100 MG PO TABS: ORAL_TABLET | ORAL | 6 refills | Status: DC

## 2022-11-18 ENCOUNTER — Other Ambulatory Visit: Payer: Self-pay | Admitting: *Deleted

## 2022-11-18 DIAGNOSIS — C9110 Chronic lymphocytic leukemia of B-cell type not having achieved remission: Secondary | ICD-10-CM

## 2022-11-18 MED ORDER — VENETOCLAX 100 MG PO TABS: ORAL_TABLET | ORAL | 6 refills | Status: AC

## 2022-11-27 ENCOUNTER — Other Ambulatory Visit: Payer: Self-pay | Admitting: *Deleted

## 2022-11-27 DIAGNOSIS — C9110 Chronic lymphocytic leukemia of B-cell type not having achieved remission: Secondary | ICD-10-CM

## 2022-11-28 ENCOUNTER — Inpatient Hospital Stay: Payer: Medicare Other

## 2022-11-28 ENCOUNTER — Inpatient Hospital Stay: Payer: Medicare Other | Attending: Hematology and Oncology | Admitting: Hematology and Oncology

## 2022-11-28 VITALS — BP 110/49 | HR 66 | Temp 97.7°F | Resp 17 | Ht <= 58 in | Wt 126.7 lb

## 2022-11-28 DIAGNOSIS — Z95828 Presence of other vascular implants and grafts: Secondary | ICD-10-CM

## 2022-11-28 DIAGNOSIS — R161 Splenomegaly, not elsewhere classified: Secondary | ICD-10-CM | POA: Insufficient documentation

## 2022-11-28 DIAGNOSIS — R197 Diarrhea, unspecified: Secondary | ICD-10-CM | POA: Insufficient documentation

## 2022-11-28 DIAGNOSIS — C9110 Chronic lymphocytic leukemia of B-cell type not having achieved remission: Secondary | ICD-10-CM

## 2022-11-28 DIAGNOSIS — D649 Anemia, unspecified: Secondary | ICD-10-CM | POA: Diagnosis not present

## 2022-11-28 LAB — CBC WITH DIFFERENTIAL (CANCER CENTER ONLY)
Abs Immature Granulocytes: 0.02 10*3/uL (ref 0.00–0.07)
Basophils Absolute: 0 10*3/uL (ref 0.0–0.1)
Basophils Relative: 1 %
Eosinophils Absolute: 0.1 10*3/uL (ref 0.0–0.5)
Eosinophils Relative: 2 %
HCT: 41.9 % (ref 36.0–46.0)
Hemoglobin: 14.1 g/dL (ref 12.0–15.0)
Immature Granulocytes: 1 %
Lymphocytes Relative: 31 %
Lymphs Abs: 1.2 10*3/uL (ref 0.7–4.0)
MCH: 32 pg (ref 26.0–34.0)
MCHC: 33.7 g/dL (ref 30.0–36.0)
MCV: 95.2 fL (ref 80.0–100.0)
Monocytes Absolute: 0.4 10*3/uL (ref 0.1–1.0)
Monocytes Relative: 11 %
Neutro Abs: 2.1 10*3/uL (ref 1.7–7.7)
Neutrophils Relative %: 54 %
Platelet Count: 135 10*3/uL — ABNORMAL LOW (ref 150–400)
RBC: 4.4 MIL/uL (ref 3.87–5.11)
RDW: 11.6 % (ref 11.5–15.5)
WBC Count: 3.9 10*3/uL — ABNORMAL LOW (ref 4.0–10.5)
nRBC: 0 % (ref 0.0–0.2)

## 2022-11-28 LAB — CMP (CANCER CENTER ONLY)
ALT: 26 U/L (ref 0–44)
AST: 22 U/L (ref 15–41)
Albumin: 4.2 g/dL (ref 3.5–5.0)
Alkaline Phosphatase: 109 U/L (ref 38–126)
Anion gap: 5 (ref 5–15)
BUN: 19 mg/dL (ref 8–23)
CO2: 29 mmol/L (ref 22–32)
Calcium: 9.4 mg/dL (ref 8.9–10.3)
Chloride: 107 mmol/L (ref 98–111)
Creatinine: 0.85 mg/dL (ref 0.44–1.00)
GFR, Estimated: >60 mL/min (ref >60)
Glucose, Bld: 93 mg/dL (ref 70–99)
Potassium: 3.8 mmol/L (ref 3.5–5.1)
Sodium: 141 mmol/L (ref 135–145)
Total Bilirubin: 0.7 mg/dL (ref 0.3–1.2)
Total Protein: 6 g/dL — ABNORMAL LOW (ref 6.5–8.1)

## 2022-11-28 LAB — LACTATE DEHYDROGENASE: LDH: 162 U/L (ref 98–192)

## 2022-11-28 MED ORDER — HEPARIN SOD (PORK) LOCK FLUSH 100 UNIT/ML IV SOLN
500.0000 [IU] | Freq: Once | INTRAVENOUS | Status: AC
  Administered 2022-11-28: 500 [IU]

## 2022-11-28 MED ORDER — SODIUM CHLORIDE 0.9 % IV SOLN: INTRAVENOUS | Status: DC

## 2022-11-28 MED ORDER — SODIUM CHLORIDE 0.9% FLUSH
10.0000 mL | Freq: Once | INTRAVENOUS | Status: AC
  Administered 2022-11-28: 10 mL

## 2022-11-28 NOTE — Progress Notes (Signed)
[facility]  Telephone:(336) [phone] Fax:(336) [phone]     ID: Rachel [name]   DOB: Jun 01, 1950  MR#: [id number]  OZD#:[id number]  Patient Care Team: Rachel Ishihara, MD as PCP - General (Internal Medicine) [name], Rachel Hue, MD (Inactive) as Consulting Physician (Oncology) Rachel Bad, MD as Consulting Physician (Obstetrics and Gynecology) Rachel Person, MD as Consulting Physician (Neurosurgery) OTHER MD:  CHIEF COMPLAINT: Chronic lymphocytic leukemia  CURRENT TREATMENT: venetoclax,   INTERVAL HISTORY:  Ms returns today for follow-up of her chronic lymphocytic leukemia/small-lymphocytic lymphoma.   She is on combintion of Ibrutinib and venetoclax. 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     Rest of the pertinent 10 point ROS reviewed and negative.   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 She has been treated with Rituxan, ofatumumab, cladribine, bendamustine and currently with ibrutinib, with details summarized below.  She is here for a follow up. She says there are some non hematological issues such as swelling of legs which stayed for about 3 days. There is some left arm pain as well as some cough, some intercostal muscle pain. She says she was using right arm quite a bit but this didn't hurt, instead left arm hurts. She is otherwise taking ibrutinib and venlafaxine regularly. Diarrhea is mild from venetoclax. No fevers or drenching night sweats, loss of appetite and loss of weight. No recent infections or abx use.  PAST MEDICAL HISTORY: Past Medical History:  Diagnosis Date    Chronic lymphoblastic leukemia 02/1998   Diverticulitis    Diverticulitis 06/25/2015   Leukemia (HCC)    Lower back pain     PAST SURGICAL HISTORY: Past Surgical History:  Procedure Laterality Date   BREAST SURGERY  2015   left breast bx   BUNIONECTOMY     right foot   lining of uterus removed     OTHER SURGICAL HISTORY     removal of uterine lining   TONSILLECTOMY      FAMILY HISTORY Family History  Problem Relation Age of Onset   Depression Son   The patient's mother will turn 58 on March 2021   GYNECOLOGIC HISTORY: Menarche age 63, first live birth age 25, she is GX P3. She stopped having periods in 1999 after endometrial stripping. She continues on hormone replacement.   SOCIAL HISTORY: Her husband Rachel [name] is retired from [employer].  The patient and her husband are now living in [place].  Her son Rachel [name] lives in [place]; he works for a [employer]. A second son lives in [place] and has two children. Son Rachel [name] lives in [place]. She has 8 grandchildren, the oldest is 105 and the youngest is 6.  ADVANCED DIRECTIVES: In place   HEALTH MAINTENANCE: Social History   Tobacco Use   Smoking status: Never    Passive exposure: Never   Smokeless tobacco: Never  Vaping Use   Vaping status: Never Used  Substance Use Topics   Alcohol use: Yes    Comment: 1 shot liquor daily   Drug use: No     Colonoscopy: 12/2018 (Rachel. Loreta [name]), repeat in 10 years  PAP: December 2012/ Rachel [name]  Bone density: Due  Lipid panel: per Rachel [name]  Allergies  Allergen Reactions   Penicillins Anaphylaxis    "stopped breathing" "drops my heartbeat" "I just pass out"   Lidocaine-Tetracaine-Epineph Other (See Comments)    Pt came in for CODE STROKE after receiving Lidocaine with Epi in dental office. No stroke, but had symptoms.     Current Outpatient Medications  Medication Sig Dispense Refill   acetaminophen (TYLENOL) 500 MG tablet Take 500-1,000 mg by mouth every 6 (six) hours  as needed for headache.      albuterol (VENTOLIN HFA) 108 (90 Base) MCG/ACT inhaler Inhale 1-2 puffs into the lungs every 6 (six) hours as needed for shortness of breath or wheezing.     azelastine (ASTELIN) 0.1 % nasal spray Place into both nostrils 2 (two) times daily. Use in each nostril as directed     betamethasone dipropionate (DIPROLENE) 0.05 % ointment Apply topically 2 (two) times daily.     Calcium Citrate-Vitamin D 200-250 MG-UNIT TABS Take 1 tablet by mouth 4 (four) times daily.     Cholecalciferol (VITAMIN D-3) 125 MCG (5000 UT) TABS Take 2 tablets by mouth daily.     ibrutinib (IMBRUVICA) 280 MG tablet Take 1 tablet (280 mg total) by mouth daily. Take with a glass of water. 56 tablet 6   lidocaine-prilocaine (EMLA) cream Apply a nickel size amount to skin on top of port- cover with saran wrap at least 1 hour before access 30 g 0   Magnesium 200 MG TABS Take 2 tablets by mouth daily.     montelukast (SINGULAIR) 10 MG tablet Take 1 tablet (10 mg total) by mouth at bedtime. 90 tablet 3   traZODone (DESYREL) 100 MG tablet TAKE 1 TABLET AT BEDTIME 90 tablet 3   valACYclovir (VALTREX) 1000 MG tablet TAKE 1 TABLET BY MOUTH TWICE DAILY FOR 5 DAYS THEN TAKE 1 TABLET BY MOUTH DAILY 80 tablet 0   venetoclax (VENCLEXTA) 100 MG tablet TAKE 1 TABLET (100 MG) BY MOUTH DAILY. TAKE WITH FOOD AND WATER AT APPROXIMATELY THE SAME TIME EACH DAY. 30 tablet 6   Zinc 30 MG TABS Take 1 tablet by mouth daily.     Current Facility-Administered Medications  Medication Dose Route Frequency Provider Last Rate Last Admin   0.9 %  sodium chloride infusion   Intravenous PRN Causey, Larna Daughters, NP        OBJECTIVE: white woman in no acute distress  Vitals:   11/28/22 0819  BP: (!) 110/49  Pulse: 66  Resp: 17  Temp: 97.7 F (36.5 C)  SpO2: 99%     Wt Readings from Last 3 Encounters:  11/28/22 126 lb 11.2 oz (57.5 kg)  07/15/22 122 lb 7 oz (55.5 kg)  04/07/22 123 lb 6.4 oz (56 kg)   Body mass  index is 27.42 kg/m.    ECOG FS:1 - Symptomatic but completely ambulatory  Physical Exam Constitutional:      Appearance: Normal appearance.  Cardiovascular:     Rate and Rhythm: Normal rate and regular rhythm.     Pulses: Normal pulses.     Heart sounds: Normal heart sounds.  Pulmonary:     Effort: Pulmonary effort is normal.     Breath sounds: Normal breath sounds.  Abdominal:     General: Abdomen is flat. Bowel sounds are normal. There is no distension.     Palpations: There is no mass.  Musculoskeletal:        General: No swelling or tenderness. Normal range of  motion.     Cervical back: Normal range of motion and neck supple. No rigidity.  Lymphadenopathy:     Cervical: No cervical adenopathy.  Skin:    General: Skin is warm and dry.  Neurological:     General: No focal deficit present.     Mental Status: She is alert.  Psychiatric:        Mood and Affect: Mood normal.     LAB RESULTS:  Lab Results  Component Value Date   WBC 4.7 07/15/2022   NEUTROABS 3.0 07/15/2022   HGB 13.7 07/15/2022   HCT 40.5 07/15/2022   MCV 95.7 07/15/2022   PLT 129 (L) 07/15/2022      Chemistry      Component Value Date/Time   NA 141 07/15/2022 1041   NA 140 12/31/2016 1406   K 3.9 07/15/2022 1041   K 3.6 12/31/2016 1406   CL 107 07/15/2022 1041   CL 101 12/15/2011 1322   CO2 28 07/15/2022 1041   CO2 24 12/31/2016 1406   BUN 16 07/15/2022 1041   BUN 12.8 12/31/2016 1406   CREATININE 0.83 07/15/2022 1041   CREATININE 0.9 12/31/2016 1406      Component Value Date/Time   CALCIUM 8.7 (L) 07/15/2022 1041   CALCIUM 8.8 12/31/2016 1406   ALKPHOS 91 07/15/2022 1041   ALKPHOS 71 12/31/2016 1406   AST 24 07/15/2022 1041   AST 18 12/31/2016 1406   ALT 20 07/15/2022 1041   ALT 11 12/31/2016 1406   BILITOT 0.6 07/15/2022 1041   BILITOT 0.49 12/31/2016 1406      STUDIES: No results found.   ASSESSMENT: 72 y.o.  [place] woman with a history of chronic lymphoid  leukemia/well-differentiated lymphocytic lymphoma dating back to January of 2002 treated with Rituxan in 2004 and 2008 and January/February 2012  (1) ofatumumab started 12/10/2011, with a complete remission not obtained after the first 8 weekly treatments; an additional 4 monthly treatments completed 06/08/2012  (2) cladribine x6 completed 04/06/2012  (3) started bendamustine/ rituximab 12/12/2013,  repeated every 28 days for 4-6 cycles, completed 05/04/2014  (a) allopurinol and acyclovir started OCT 2015   (4) anemia: B-12 and folate WNL 02/28/2014; ferritin 48; absolute retic count 41.3, nl MCV  (5) reaction to rituximab vs. rigors 02/27/2014: received IV vancomycin and levaquin briefly, then levaquin po (pcn allergy)  (6) bendamustine/ rituximab for 6 cycles, completed 05/04/2014, with improvement but not normalization of the absolute lymphocyte count  (7) ibrutinib started 05/08/2014, currently at 280 mg daily. Stopped by patient in June, 2019 restarted in September, 2019, discontinued October 2020  (a) hepatitis B studies 03/06/2014 and 12/31/2016, negative  (b) ibrutinib restarted 01/06/2019 (at 280 mg daily) in conjunction with venetoclax  (c) ibrutinib held after 10/18/2019, now used as needed for wart control  (8) CT abd/pelvis 10/22/2018 shows retroperitoneal adenopathy, increased splenomegaly  (a) repeat CT scan of the neck chest abdomen and pelvis 07/02/2019, with resolution of the previously noted adenopathy and splenomegaly.  (9) started venetoclax 12/15/2018  (a) uneventful ramp-up to the 100 mg a day dose, then held at that dose due to symptoms  (b) repeat hepatitis panel 10/18/2019 again negative  (10) evusheld started march 2022, repeated Sept 2022   PLAN:  She is now on venetoclax alone and Ibrutinib , ibrutinib is for wart control. She is doing well with the combination except for mild diarrhea. She takes imodium PRN No concerns on physical exam today.   CBC  today reviewed, no concerns  for worsening CLL. Rest of the labs are pending RTC in 12  weeks with repeat labs   Total encounter time 20 minutes.   *Total Encounter Time as defined by the Centers for Medicare and Medicaid Services includes, in addition to the face-to-face time of a patient visit (documented in the note above) non-face-to-face time: obtaining and reviewing outside history, ordering and reviewing medications, tests or procedures, care coordination (communications with other health care professionals or caregivers) and documentation in the medical record.

## 2022-12-09 DIAGNOSIS — Z23 Encounter for immunization: Secondary | ICD-10-CM | POA: Diagnosis not present

## 2022-12-09 DIAGNOSIS — R0609 Other forms of dyspnea: Secondary | ICD-10-CM | POA: Diagnosis not present

## 2022-12-09 DIAGNOSIS — R197 Diarrhea, unspecified: Secondary | ICD-10-CM | POA: Diagnosis not present

## 2022-12-09 DIAGNOSIS — R635 Abnormal weight gain: Secondary | ICD-10-CM | POA: Diagnosis not present

## 2022-12-09 DIAGNOSIS — R252 Cramp and spasm: Secondary | ICD-10-CM | POA: Diagnosis not present

## 2022-12-09 DIAGNOSIS — R42 Dizziness and giddiness: Secondary | ICD-10-CM | POA: Diagnosis not present

## 2022-12-24 ENCOUNTER — Other Ambulatory Visit (HOSPITAL_COMMUNITY): Payer: Self-pay | Admitting: Internal Medicine

## 2022-12-24 DIAGNOSIS — R0600 Dyspnea, unspecified: Secondary | ICD-10-CM

## 2023-01-01 ENCOUNTER — Other Ambulatory Visit: Payer: Self-pay | Admitting: *Deleted

## 2023-01-01 ENCOUNTER — Inpatient Hospital Stay: Payer: Medicare Other | Admitting: Hematology and Oncology

## 2023-01-01 ENCOUNTER — Other Ambulatory Visit: Payer: Medicare Other | Attending: Hematology and Oncology

## 2023-01-01 ENCOUNTER — Encounter: Payer: Self-pay | Admitting: Hematology and Oncology

## 2023-01-01 VITALS — BP 118/43 | HR 67 | Temp 97.2°F | Resp 18 | Wt 124.1 lb

## 2023-01-01 DIAGNOSIS — M7989 Other specified soft tissue disorders: Secondary | ICD-10-CM | POA: Insufficient documentation

## 2023-01-01 DIAGNOSIS — C9110 Chronic lymphocytic leukemia of B-cell type not having achieved remission: Secondary | ICD-10-CM

## 2023-01-01 DIAGNOSIS — R059 Cough, unspecified: Secondary | ICD-10-CM | POA: Diagnosis not present

## 2023-01-01 DIAGNOSIS — Z630 Problems in relationship with spouse or partner: Secondary | ICD-10-CM | POA: Diagnosis not present

## 2023-01-01 DIAGNOSIS — R197 Diarrhea, unspecified: Secondary | ICD-10-CM | POA: Insufficient documentation

## 2023-01-01 DIAGNOSIS — D649 Anemia, unspecified: Secondary | ICD-10-CM | POA: Insufficient documentation

## 2023-01-01 DIAGNOSIS — R0989 Other specified symptoms and signs involving the circulatory and respiratory systems: Secondary | ICD-10-CM | POA: Insufficient documentation

## 2023-01-01 DIAGNOSIS — M79602 Pain in left arm: Secondary | ICD-10-CM | POA: Diagnosis not present

## 2023-01-01 DIAGNOSIS — R0782 Intercostal pain: Secondary | ICD-10-CM | POA: Insufficient documentation

## 2023-01-01 LAB — CBC WITH DIFFERENTIAL (CANCER CENTER ONLY)
Abs Immature Granulocytes: 0.02 10*3/uL (ref 0.00–0.07)
Basophils Absolute: 0 10*3/uL (ref 0.0–0.1)
Basophils Relative: 0 %
Eosinophils Absolute: 0 10*3/uL (ref 0.0–0.5)
Eosinophils Relative: 0 %
HCT: 43 % (ref 36.0–46.0)
Hemoglobin: 14.7 g/dL (ref 12.0–15.0)
Immature Granulocytes: 0 %
Lymphocytes Relative: 14 %
Lymphs Abs: 1.1 10*3/uL (ref 0.7–4.0)
MCH: 32.2 pg (ref 26.0–34.0)
MCHC: 34.2 g/dL (ref 30.0–36.0)
MCV: 94.1 fL (ref 80.0–100.0)
Monocytes Absolute: 0.6 10*3/uL (ref 0.1–1.0)
Monocytes Relative: 8 %
Neutro Abs: 5.6 10*3/uL (ref 1.7–7.7)
Neutrophils Relative %: 78 %
Platelet Count: 123 10*3/uL — ABNORMAL LOW (ref 150–400)
RBC: 4.57 MIL/uL (ref 3.87–5.11)
RDW: 11.7 % (ref 11.5–15.5)
WBC Count: 7.3 10*3/uL (ref 4.0–10.5)
nRBC: 0 % (ref 0.0–0.2)

## 2023-01-01 LAB — LACTATE DEHYDROGENASE: LDH: 159 U/L (ref 98–192)

## 2023-01-01 LAB — CMP (CANCER CENTER ONLY)
ALT: 20 U/L (ref 0–44)
AST: 21 U/L (ref 15–41)
Albumin: 4.3 g/dL (ref 3.5–5.0)
Alkaline Phosphatase: 95 U/L (ref 38–126)
Anion gap: 6 (ref 5–15)
BUN: 18 mg/dL (ref 8–23)
CO2: 29 mmol/L (ref 22–32)
Calcium: 9.7 mg/dL (ref 8.9–10.3)
Chloride: 105 mmol/L (ref 98–111)
Creatinine: 0.9 mg/dL (ref 0.44–1.00)
GFR, Estimated: >60 mL/min (ref >60)
Glucose, Bld: 90 mg/dL (ref 70–99)
Potassium: 4.1 mmol/L (ref 3.5–5.1)
Sodium: 140 mmol/L (ref 135–145)
Total Bilirubin: 0.9 mg/dL (ref <1.2)
Total Protein: 6.5 g/dL (ref 6.5–8.1)

## 2023-01-01 MED ORDER — VALACYCLOVIR HCL 1 G PO TABS: 1000.0000 mg | ORAL_TABLET | Freq: Every day | ORAL | 1 refills | Status: DC

## 2023-01-01 MED ORDER — VALACYCLOVIR HCL 1 G PO TABS: 1000.0000 mg | ORAL_TABLET | Freq: Every day | ORAL | 1 refills | Status: AC

## 2023-01-01 NOTE — Progress Notes (Signed)
[facility]  [phone]) [phone] [phone]) [phone]     ID: Rachel [name]   DOB: 1950/05/11  MR#: [id number]  GNF#:[id number]  Patient Care Team: Rachel Ishihara, MD as PCP - General (Internal Medicine) [name], Rachel Hue, MD (Inactive) as Consulting Physician (Oncology) Rachel Bad, MD as Consulting Physician (Obstetrics and Gynecology) Rachel Person, MD as Consulting Physician (Neurosurgery) OTHER MD:  CHIEF COMPLAINT: Chronic lymphocytic leukemia  CURRENT TREATMENT: venetoclax and Ibrutinib  INTERVAL HISTORY:  Discussed the use of AI scribe software for clinical note transcription with the patient, who gave verbal consent to proceed.  History of Present Illness    The patient, with a history of CLL for 24 years, presents with a desire to stop Venetoclax due to severe diarrhea. She reports that the diarrhea occurs three times a week and is so severe that she has to wear a pad when traveling due to fear of not making it to the bathroom in time. She expresses a desire to stop the venetoclax and monitor the CLL, understanding that she may need to restart the medication if the CLL becomes active again.  The patient also reports feeling down due to relationship issues with her husband. She describes her husband as having severe PTSD due to war and that he has threatened to harm himself in the past. The patient reports feeling stressed and tired due to these ongoing issues.  In addition to the CLL and relationship stress, the patient also reports feeling like she is choking in the middle of the night and struggling to breathe. She has an echocardiogram scheduled due to these symptoms and a family history of heart disease.   Rest of the pertinent 10 point ROS reviewed and negative.   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 She has been treated with Rituxan, ofatumumab, cladribine, bendamustine and currently with ibrutinib, with details summarized below.  She is here for a follow up. She says there are some non hematological issues such as swelling of legs which stayed for about 3 days. There is some left arm pain as well as some cough, some intercostal muscle pain. She says she was using right arm quite a bit but this didn't hurt, instead left arm hurts. She is otherwise taking ibrutinib and venlafaxine regularly. Diarrhea is mild from venetoclax. No fevers or drenching night sweats, loss of appetite and loss of weight. No recent infections or abx use.  PAST MEDICAL HISTORY: 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   Diverticulitis    Diverticulitis 06/25/2015   Leukemia (HCC)    Lower back pain     PAST SURGICAL HISTORY: Past Surgical History:  Procedure Laterality Date   BREAST SURGERY  2015   left breast bx   BUNIONECTOMY     right foot   lining of uterus removed     OTHER SURGICAL HISTORY     removal of uterine lining   TONSILLECTOMY      FAMILY HISTORY Family History  Problem Relation Age of Onset   Depression Son   The patient's mother will turn 71 on March 2021   GYNECOLOGIC HISTORY: Menarche age 2, first live birth age 60, she is GX P3. She stopped having periods in 1999 after endometrial stripping. She continues on  hormone replacement.   SOCIAL HISTORY: Her husband Rachel [name] is retired from [employer].  The patient and her husband are now living in [place].  Her son Rachel [name] lives in [place]; he works for a [employer]. A second son lives in [place] and has two children. Son Rachel [name] lives in [place]. She has 8 grandchildren, the oldest is 75 and the youngest is 6.  ADVANCED DIRECTIVES: In place   HEALTH  MAINTENANCE: Social History   Tobacco Use   Smoking status: Never    Passive exposure: Never   Smokeless tobacco: Never  Vaping Use   Vaping status: Never Used  Substance Use Topics   Alcohol use: Yes    Comment: 1 shot liquor daily   Drug use: No     Colonoscopy: 12/2018 (Dr. Loreta Ave), repeat in 10 years  PAP: December 2012/ [name]  Bone density: Due  Lipid panel: per Dr Zachery Dauer  Allergies  Allergen Reactions   Penicillins Anaphylaxis    "stopped breathing" "drops my heartbeat" "I just pass out"   Lidocaine-Tetracaine-Epineph Other (See Comments)    Pt came in for CODE STROKE after receiving Lidocaine with Epi in dental office. No stroke, but had symptoms.     Current Outpatient Medications  Medication Sig Dispense Refill   acetaminophen (TYLENOL) 500 MG tablet Take 500-1,000 mg by mouth every 6 (six) hours as needed for headache.      albuterol (VENTOLIN HFA) 108 (90 Base) MCG/ACT inhaler Inhale 1-2 puffs into the lungs every 6 (six) hours as needed for shortness of breath or wheezing.     azelastine (ASTELIN) 0.1 % nasal spray Place into both nostrils 2 (two) times daily. Use in each nostril as directed     betamethasone dipropionate (DIPROLENE) 0.05 % ointment Apply topically 2 (two) times daily.     Calcium Citrate-Vitamin D 200-250 MG-UNIT TABS Take 1 tablet by mouth 4 (four) times daily.     Cholecalciferol (VITAMIN D-3) 125 MCG (5000 UT) TABS Take 2 tablets by mouth daily.     ibrutinib (IMBRUVICA) 280 MG tablet Take 1 tablet (280 mg total) by mouth daily. Take with a glass of water. 56 tablet 6   lidocaine-prilocaine (EMLA) cream Apply a nickel size amount to skin on top of port- cover with saran wrap at least 1 hour before access 30 g 0   Magnesium 200 MG TABS Take 2 tablets by mouth daily.     montelukast (SINGULAIR) 10 MG tablet Take 1 tablet (10 mg total) by mouth at bedtime. 90 tablet 3   traZODone (DESYREL) 100 MG tablet TAKE 1 TABLET AT BEDTIME 90 tablet 3    valACYclovir (VALTREX) 1000 MG tablet TAKE 1 TABLET BY MOUTH TWICE DAILY FOR 5 DAYS THEN TAKE 1 TABLET BY MOUTH DAILY 80 tablet 0   venetoclax (VENCLEXTA) 100 MG tablet TAKE 1 TABLET (100 MG) BY MOUTH DAILY. TAKE WITH FOOD AND WATER AT APPROXIMATELY THE SAME TIME EACH DAY. 30 tablet 6   Zinc 30 MG TABS Take 1 tablet by mouth daily.     Current Facility-Administered Medications  Medication Dose Route Frequency Provider Last Rate Last Admin   0.9 %  sodium chloride infusion   Intravenous PRN Causey, Larna Daughters, NP        OBJECTIVE: white woman in no acute distress  There were no vitals filed for this visit.    Wt Readings from Last 3 Encounters:  11/28/22 126 lb 11.2 oz (57.5 kg)  07/15/22 122 lb 7  oz (55.5 kg)  04/07/22 123 lb 6.4 oz (56 kg)   There is no height or weight on file to calculate BMI.    ECOG FS:1 - Symptomatic but completely ambulatory  Physical Exam Constitutional:      Appearance: Normal appearance.  Cardiovascular:     Rate and Rhythm: Normal rate and regular rhythm.     Pulses: Normal pulses.     Heart sounds: Normal heart sounds.  Pulmonary:     Effort: Pulmonary effort is normal.     Breath sounds: Normal breath sounds.  Abdominal:     General: Abdomen is flat. Bowel sounds are normal. There is no distension.     Palpations: There is no mass.  Musculoskeletal:        General: No swelling or tenderness. Normal range of motion.     Cervical back: Normal range of motion and neck supple. No rigidity.  Lymphadenopathy:     Cervical: No cervical adenopathy.  Skin:    General: Skin is warm and dry.  Neurological:     General: No focal deficit present.     Mental Status: She is alert.  Psychiatric:        Mood and Affect: Mood normal.    LAB RESULTS:  Lab Results  Component Value Date   WBC 3.9 (L) 11/28/2022   NEUTROABS 2.1 11/28/2022   HGB 14.1 11/28/2022   HCT 41.9 11/28/2022   MCV 95.2 11/28/2022   PLT 135 (L) 11/28/2022       Chemistry      Component Value Date/Time   NA 141 11/28/2022 0800   NA 140 12/31/2016 1406   K 3.8 11/28/2022 0800   K 3.6 12/31/2016 1406   CL 107 11/28/2022 0800   CL 101 12/15/2011 1322   CO2 29 11/28/2022 0800   CO2 24 12/31/2016 1406   BUN 19 11/28/2022 0800   BUN 12.8 12/31/2016 1406   CREATININE 0.85 11/28/2022 0800   CREATININE 0.9 12/31/2016 1406      Component Value Date/Time   CALCIUM 9.4 11/28/2022 0800   CALCIUM 8.8 12/31/2016 1406   ALKPHOS 109 11/28/2022 0800   ALKPHOS 71 12/31/2016 1406   AST 22 11/28/2022 0800   AST 18 12/31/2016 1406   ALT 26 11/28/2022 0800   ALT 11 12/31/2016 1406   BILITOT 0.7 11/28/2022 0800   BILITOT 0.49 12/31/2016 1406      STUDIES: No results found.   ASSESSMENT: 72 y.o.  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 rigors 02/27/2014: received IV vancomycin and levaquin briefly, then levaquin po (pcn allergy)  (6) bendamustine/ rituximab for 6 cycles, completed 05/04/2014, with improvement but not normalization of the absolute lymphocyte count  (7) ibrutinib started 05/08/2014, currently at 280 mg daily. Stopped by patient in June, 2019 restarted in September, 2019, discontinued October 2020  (a) hepatitis B studies 03/06/2014 and 12/31/2016, negative  (b) ibrutinib restarted 01/06/2019 (at 280  mg daily) in conjunction with venetoclax  (c) ibrutinib held after 10/18/2019, now used as needed for wart control  (8) CT abd/pelvis 10/22/2018 shows retroperitoneal adenopathy, increased splenomegaly  (a) repeat CT scan of the neck chest abdomen and pelvis 07/02/2019, with resolution of the previously noted adenopathy and splenomegaly.  (9) started venetoclax 12/15/2018  (a) uneventful ramp-up to the 100 mg a day dose, then held at that dose due to symptoms  (b) repeat hepatitis panel 10/18/2019 again negative  (10) evusheld started march 2022, repeated Sept 2022   PLAN:  Chronic Lymphocytic Leukemia (CLL) Patient has been on Venetoclax for 5 years and wishes to discontinue due to severe diarrhea. Discussed the potential need to restart medication if CLL becomes active again. -Discontinue Venetoclax. -Monitor CLL closely with regular blood counts.  Diarrhea, pt attributes this to venetoclax This has been impairing her QOL. She would like to take some time off of venetoclax and monitor.  Ibrutinib Patient is on Ibrutinib, previously used for CLL but she has skin warts when she comes off ibrutinib hence she prefers to continue ibrutinib at this time.    Cardiac Concerns Patient reports occasional choking sensation at night and has a family history of heart issues. Echocardiogram scheduled. -Continue with scheduled echocardiogram on 01/27/2023.  Follow-up Given the discontinuation of Venetoclax and ongoing monitoring of CLL, a follow-up appointment is necessary. -Schedule follow-up appointment in 3 months.  Total encounter time 30 minutes.   *Total Encounter Time as defined by the Centers for Medicare and Medicaid Services includes, in addition to the face-to-face time of a patient visit (documented in the note above) non-face-to-face time: obtaining and reviewing outside history, ordering and reviewing medications, tests or procedures, care coordination (communications with other  health care professionals or caregivers) and documentation in the medical record.

## 2023-01-05 ENCOUNTER — Telehealth: Payer: Self-pay

## 2023-01-05 NOTE — Telephone Encounter (Signed)
Called [facility] to clarify prescription refill request that was transferred from another pharmacy. Clarified that strength is 1000 mg, quantity is 90 and refills are 3.  Take 1 tablet daily is the instructions.  Reference number from [facility] was [id number].  Lorayne Marek, RN

## 2023-01-27 ENCOUNTER — Ambulatory Visit (HOSPITAL_COMMUNITY): Payer: Medicare Other | Attending: Internal Medicine

## 2023-01-27 DIAGNOSIS — R0600 Dyspnea, unspecified: Secondary | ICD-10-CM | POA: Diagnosis not present

## 2023-01-27 LAB — ECHOCARDIOGRAM COMPLETE
Area-P 1/2: 3.16 cm2
S' Lateral: 2.5 cm

## 2023-01-29 DIAGNOSIS — J4520 Mild intermittent asthma, uncomplicated: Secondary | ICD-10-CM | POA: Diagnosis not present

## 2023-01-29 DIAGNOSIS — I700 Atherosclerosis of aorta: Secondary | ICD-10-CM | POA: Diagnosis not present

## 2023-01-29 DIAGNOSIS — J0101 Acute recurrent maxillary sinusitis: Secondary | ICD-10-CM | POA: Diagnosis not present

## 2023-01-29 DIAGNOSIS — J45909 Unspecified asthma, uncomplicated: Secondary | ICD-10-CM | POA: Diagnosis not present

## 2023-01-29 DIAGNOSIS — J305 Allergic rhinitis due to food: Secondary | ICD-10-CM | POA: Diagnosis not present

## 2023-01-29 DIAGNOSIS — Z23 Encounter for immunization: Secondary | ICD-10-CM | POA: Diagnosis not present

## 2023-01-29 DIAGNOSIS — Z0000 Encounter for general adult medical examination without abnormal findings: Secondary | ICD-10-CM | POA: Diagnosis not present

## 2023-01-29 DIAGNOSIS — C9110 Chronic lymphocytic leukemia of B-cell type not having achieved remission: Secondary | ICD-10-CM | POA: Diagnosis not present

## 2023-01-29 DIAGNOSIS — F3341 Major depressive disorder, recurrent, in partial remission: Secondary | ICD-10-CM | POA: Diagnosis not present

## 2023-01-29 DIAGNOSIS — M810 Age-related osteoporosis without current pathological fracture: Secondary | ICD-10-CM | POA: Diagnosis not present

## 2023-01-29 DIAGNOSIS — R0609 Other forms of dyspnea: Secondary | ICD-10-CM | POA: Diagnosis not present

## 2023-02-27 DIAGNOSIS — Z03818 Encounter for observation for suspected exposure to other biological agents ruled out: Secondary | ICD-10-CM | POA: Diagnosis not present

## 2023-02-27 DIAGNOSIS — J0100 Acute maxillary sinusitis, unspecified: Secondary | ICD-10-CM | POA: Diagnosis not present

## 2023-02-27 DIAGNOSIS — J069 Acute upper respiratory infection, unspecified: Secondary | ICD-10-CM | POA: Diagnosis not present

## 2023-03-09 DIAGNOSIS — M8588 Other specified disorders of bone density and structure, other site: Secondary | ICD-10-CM | POA: Diagnosis not present

## 2023-04-02 ENCOUNTER — Other Ambulatory Visit: Payer: Medicare Other

## 2023-04-02 ENCOUNTER — Ambulatory Visit: Payer: Medicare Other | Admitting: Hematology and Oncology

## 2023-04-07 ENCOUNTER — Other Ambulatory Visit: Payer: Self-pay | Admitting: *Deleted

## 2023-04-07 ENCOUNTER — Telehealth: Payer: Self-pay

## 2023-04-07 DIAGNOSIS — C9110 Chronic lymphocytic leukemia of B-cell type not having achieved remission: Secondary | ICD-10-CM

## 2023-04-07 NOTE — Telephone Encounter (Signed)
Spoke with patient and confirmed visit with Dr Al Pimple on 04/08/23.Marland Kitchen Also advised patient that she has a lab visit as well.. and to try and arrive at 15 mins prior to that visit.  Patient confirmed and understood.

## 2023-04-08 ENCOUNTER — Inpatient Hospital Stay: Payer: Medicare Other | Attending: Hematology and Oncology

## 2023-04-08 ENCOUNTER — Encounter: Payer: Self-pay | Admitting: Hematology and Oncology

## 2023-04-08 ENCOUNTER — Inpatient Hospital Stay (HOSPITAL_BASED_OUTPATIENT_CLINIC_OR_DEPARTMENT_OTHER): Payer: Medicare Other | Admitting: Hematology and Oncology

## 2023-04-08 ENCOUNTER — Inpatient Hospital Stay: Payer: Medicare Other

## 2023-04-08 VITALS — BP 108/56 | HR 65 | Temp 98.1°F | Resp 15 | Wt 124.9 lb

## 2023-04-08 DIAGNOSIS — D649 Anemia, unspecified: Secondary | ICD-10-CM | POA: Insufficient documentation

## 2023-04-08 DIAGNOSIS — M62838 Other muscle spasm: Secondary | ICD-10-CM | POA: Diagnosis not present

## 2023-04-08 DIAGNOSIS — Z9221 Personal history of antineoplastic chemotherapy: Secondary | ICD-10-CM | POA: Diagnosis not present

## 2023-04-08 DIAGNOSIS — R42 Dizziness and giddiness: Secondary | ICD-10-CM | POA: Insufficient documentation

## 2023-04-08 DIAGNOSIS — C9110 Chronic lymphocytic leukemia of B-cell type not having achieved remission: Secondary | ICD-10-CM

## 2023-04-08 DIAGNOSIS — Z95828 Presence of other vascular implants and grafts: Secondary | ICD-10-CM

## 2023-04-08 DIAGNOSIS — M8580 Other specified disorders of bone density and structure, unspecified site: Secondary | ICD-10-CM | POA: Diagnosis not present

## 2023-04-08 LAB — CMP (CANCER CENTER ONLY)
ALT: 23 U/L (ref 0–44)
AST: 24 U/L (ref 15–41)
Albumin: 4.2 g/dL (ref 3.5–5.0)
Alkaline Phosphatase: 103 U/L (ref 38–126)
Anion gap: 7 (ref 5–15)
BUN: 13 mg/dL (ref 8–23)
CO2: 27 mmol/L (ref 22–32)
Calcium: 9.1 mg/dL (ref 8.9–10.3)
Chloride: 105 mmol/L (ref 98–111)
Creatinine: 0.82 mg/dL (ref 0.44–1.00)
GFR, Estimated: >60 mL/min (ref >60)
Glucose, Bld: 91 mg/dL (ref 70–99)
Potassium: 4 mmol/L (ref 3.5–5.1)
Sodium: 139 mmol/L (ref 135–145)
Total Bilirubin: 0.8 mg/dL (ref 0.0–1.2)
Total Protein: 6.2 g/dL — ABNORMAL LOW (ref 6.5–8.1)

## 2023-04-08 LAB — LACTATE DEHYDROGENASE: LDH: 161 U/L (ref 98–192)

## 2023-04-08 LAB — CBC WITH DIFFERENTIAL (CANCER CENTER ONLY)
Abs Immature Granulocytes: 0.03 10*3/uL (ref 0.00–0.07)
Basophils Absolute: 0 10*3/uL (ref 0.0–0.1)
Basophils Relative: 1 %
Eosinophils Absolute: 0.1 10*3/uL (ref 0.0–0.5)
Eosinophils Relative: 1 %
HCT: 41.4 % (ref 36.0–46.0)
Hemoglobin: 14 g/dL (ref 12.0–15.0)
Immature Granulocytes: 0 %
Lymphocytes Relative: 23 %
Lymphs Abs: 1.6 10*3/uL (ref 0.7–4.0)
MCH: 31.2 pg (ref 26.0–34.0)
MCHC: 33.8 g/dL (ref 30.0–36.0)
MCV: 92.2 fL (ref 80.0–100.0)
Monocytes Absolute: 0.6 10*3/uL (ref 0.1–1.0)
Monocytes Relative: 8 %
Neutro Abs: 4.8 10*3/uL (ref 1.7–7.7)
Neutrophils Relative %: 67 %
Platelet Count: 122 10*3/uL — ABNORMAL LOW (ref 150–400)
RBC: 4.49 MIL/uL (ref 3.87–5.11)
RDW: 11.6 % (ref 11.5–15.5)
WBC Count: 7.1 10*3/uL (ref 4.0–10.5)
nRBC: 0 % (ref 0.0–0.2)

## 2023-04-08 MED ORDER — SODIUM CHLORIDE 0.9% FLUSH
10.0000 mL | Freq: Once | INTRAVENOUS | Status: AC
Start: 2023-04-08 — End: 2023-04-08
  Administered 2023-04-08: 10 mL

## 2023-04-08 MED ORDER — HEPARIN SOD (PORK) LOCK FLUSH 100 UNIT/ML IV SOLN
500.0000 [IU] | Freq: Once | INTRAVENOUS | Status: AC
  Administered 2023-04-08: 500 [IU]

## 2023-04-08 NOTE — Progress Notes (Signed)
[facility]  [phone] [phone]     ID: Rachel [name]   DOB: 1950/11/24  MR#: [id number]  FAO#:[id number]  Patient Care Team: Lorenda Ishihara, MD as PCP - General (Internal Medicine) Magrinat, Valentino Hue, MD (Inactive) as Consulting Physician (Oncology) Brock Bad, MD as Consulting Physician (Obstetrics and Gynecology) Bedelia Person, MD as Consulting Physician (Neurosurgery)  CHIEF COMPLAINT: Chronic lymphocytic leukemia  CURRENT TREATMENT:  Ibrutinib  INTERVAL HISTORY:  Discussed the use of AI scribe software for clinical note transcription with the patient, who gave verbal consent to proceed.  History of Present Illness    The patient, with a history of CLL for 24 years, who is now on Ibrutinib alone comes here for follow up.  Rachel [name] is a 73 year old female who presents with persistent vertigo symptoms.  She experiences persistent vertigo that has not improved despite two courses of antibiotics prescribed for fluid in her ears. 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  She is currently not taking venetoclax but continues on Imbruvica. Recent blood work shows a stable white blood cell count, normal hemoglobin, and consistently low platelets, which is typical for her. She mentions a recent trip to [place] where she did not experience diarrhea as she used to, attributing this improvement to stopping venetoclax. However, she still experiences occasional loose stools.  She describes experiencing painful spasms in her head, particularly behind the ear, which occur when she strains her neck. She takes magnesium supplements and has recently resumed eating bananas, which she believes helps with cramping. She admits to not drinking enough water and often feels dehydrated.  She recently traveled to [place] and  reports not eating properly during the trip. She also mentions a habit of drinking tequila in the mornings. She is considering marriage counseling after a recent argument with her husband during the trip. No fevers, night sweats, or infections. Her appetite remains unchanged.  Rest of the pertinent 10 point ROS reviewed and negative.   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 She has been treated with Imbruvica followed by Imbruvica with venetoclax.  PAST MEDICAL HISTORY: 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   Diverticulitis    Diverticulitis 06/25/2015   Leukemia (HCC)    Lower back pain     PAST SURGICAL HISTORY: Past Surgical History:  Procedure Laterality Date   BREAST SURGERY  2015   left breast bx   BUNIONECTOMY     right foot   lining of uterus removed     OTHER SURGICAL HISTORY     removal of uterine lining   TONSILLECTOMY      FAMILY HISTORY Family History  Problem Relation Age of Onset   Depression Son   The patient's mother will turn 12 on March 2021   GYNECOLOGIC HISTORY: Menarche age 14, first live birth age 73, she is GX P3. She stopped having periods in 1999 after endometrial stripping. She continues on hormone replacement.   SOCIAL HISTORY: Her husband [name] is retired from [employer].  The patient and her husband are now living in [place].  Her son [name] lives in [place]; he works for a [employer]. A second  son lives in [place] and has two children. Son [name] lives in [place]. She has 8 grandchildren, the oldest is 73 and the youngest is 6.  ADVANCED DIRECTIVES: In place   HEALTH MAINTENANCE: Social History   Tobacco Use   Smoking status: Never     Passive exposure: Never   Smokeless tobacco: Never  Vaping Use   Vaping status: Never Used  Substance Use Topics   Alcohol use: Yes    Comment: 1 shot liquor daily   Drug use: No     Colonoscopy: 12/2018 (Dr. Loreta Ave), repeat in 10 years  PAP: December 2012/ [name]  Bone density: Due  Lipid panel: per Dr Zachery Dauer  Allergies  Allergen Reactions   Penicillins Anaphylaxis    "stopped breathing" "drops my heartbeat" "I just pass out"   Lidocaine-Tetracaine-Epineph Other (See Comments)    Pt came in for CODE STROKE after receiving Lidocaine with Epi in dental office. No stroke, but had symptoms.     Current Outpatient Medications  Medication Sig Dispense Refill   acetaminophen (TYLENOL) 500 MG tablet Take 500-1,000 mg by mouth every 6 (six) hours as needed for headache.      albuterol (VENTOLIN HFA) 108 (90 Base) MCG/ACT inhaler Inhale 1-2 puffs into the lungs every 6 (six) hours as needed for shortness of breath or wheezing.     azelastine (ASTELIN) 0.1 % nasal spray Place into both nostrils 2 (two) times daily. Use in each nostril as directed     betamethasone dipropionate (DIPROLENE) 0.05 % ointment Apply topically 2 (two) times daily.     Calcium Citrate-Vitamin D 200-250 MG-UNIT TABS Take 1 tablet by mouth 4 (four) times daily.     Cholecalciferol (VITAMIN D-3) 125 MCG (5000 UT) TABS Take 2 tablets by mouth daily.     ibrutinib (IMBRUVICA) 280 MG tablet Take 1 tablet (280 mg total) by mouth daily. Take with a glass of water. 56 tablet 6   lidocaine-prilocaine (EMLA) cream Apply a nickel size amount to skin on top of port- cover with saran wrap at least 1 hour before access 30 g 0   Magnesium 200 MG TABS Take 2 tablets by mouth daily.     montelukast (SINGULAIR) 10 MG tablet Take 1 tablet (10 mg total) by mouth at bedtime. 90 tablet 3   traZODone (DESYREL) 100 MG tablet TAKE 1 TABLET AT BEDTIME 90 tablet 3   valACYclovir (VALTREX) 1000 MG tablet Take 1 tablet (1,000 mg total) by mouth  daily. TAKE 1 TABLET BY MOUTH TWICE DAILY FOR 5 DAYS THEN TAKE 1 TABLET BY MOUTH DAILY 90 tablet 1   venetoclax (VENCLEXTA) 100 MG tablet TAKE 1 TABLET (100 MG) BY MOUTH DAILY. TAKE WITH FOOD AND WATER AT APPROXIMATELY THE SAME TIME EACH DAY. 30 tablet 6   Zinc 30 MG TABS Take 1 tablet by mouth daily.     Current Facility-Administered Medications  Medication Dose Route Frequency Provider Last Rate Last Admin   0.9 %  sodium chloride infusion   Intravenous PRN Causey, Larna Daughters, NP        OBJECTIVE: white woman in no acute distress  There were no vitals filed for this visit.    Wt Readings from Last 3 Encounters:  01/01/23 124 lb 1.6 oz (56.3 kg)  11/28/22 126 lb 11.2 oz (57.5 kg)  07/15/22 122 lb 7 oz (55.5 kg)   There is no height or weight on file to calculate BMI.    ECOG FS:1 - Symptomatic but  completely ambulatory  Physical Exam Constitutional:      Appearance: Normal appearance.  Cardiovascular:     Rate and Rhythm: Normal rate and regular rhythm.     Pulses: Normal pulses.     Heart sounds: Normal heart sounds.  Pulmonary:     Effort: Pulmonary effort is normal.     Breath sounds: Normal breath sounds.  Abdominal:     General: Abdomen is flat. Bowel sounds are normal. There is no distension.     Palpations: There is no mass.  Musculoskeletal:        General: No swelling or tenderness. Normal range of motion.     Cervical back: Normal range of motion and neck supple. No rigidity.  Lymphadenopathy:     Cervical: No cervical adenopathy.  Skin:    General: Skin is warm and dry.  Neurological:     General: No focal deficit present.     Mental Status: She is alert.  Psychiatric:        Mood and Affect: Mood normal.     LAB RESULTS:  Lab Results  Component Value Date   WBC 7.3 01/01/2023   NEUTROABS 5.6 01/01/2023   HGB 14.7 01/01/2023   HCT 43.0 01/01/2023   MCV 94.1 01/01/2023   PLT 123 (L) 01/01/2023      Chemistry      Component Value  Date/Time   NA 140 01/01/2023 0943   NA 140 12/31/2016 1406   K 4.1 01/01/2023 0943   K 3.6 12/31/2016 1406   CL 105 01/01/2023 0943   CL 101 12/15/2011 1322   CO2 29 01/01/2023 0943   CO2 24 12/31/2016 1406   BUN 18 01/01/2023 0943   BUN 12.8 12/31/2016 1406   CREATININE 0.90 01/01/2023 0943   CREATININE 0.9 12/31/2016 1406      Component Value Date/Time   CALCIUM 9.7 01/01/2023 0943   CALCIUM 8.8 12/31/2016 1406   ALKPHOS 95 01/01/2023 0943   ALKPHOS 71 12/31/2016 1406   AST 21 01/01/2023 0943   AST 18 12/31/2016 1406   ALT 20 01/01/2023 0943   ALT 11 12/31/2016 1406   BILITOT 0.9 01/01/2023 0943   BILITOT 0.49 12/31/2016 1406      STUDIES: No results found.   ASSESSMENT: 73 y.o.  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 rigors 02/27/2014: received IV vancomycin and levaquin briefly, then levaquin po (pcn allergy)  (6) bendamustine/ rituximab for 6 cycles, completed 05/04/2014, with improvement but not normalization of the absolute lymphocyte count  (7) ibrutinib started 05/08/2014, currently at 280 mg daily. Stopped by patient in June, 2019 restarted in September, 2019, discontinued October 2020  (a) hepatitis B studies 03/06/2014 and 12/31/2016, negative  (b) ibrutinib restarted 01/06/2019 (at 280 mg daily) in conjunction with  venetoclax  (c) ibrutinib held after 10/18/2019, now used as needed for wart control  (8) CT abd/pelvis 10/22/2018 shows retroperitoneal adenopathy, increased splenomegaly  (a)  repeat CT scan of the neck chest abdomen and pelvis 07/02/2019, with resolution of the previously noted adenopathy and splenomegaly.  (9) started venetoclax 12/15/2018  (a) uneventful ramp-up to the 100 mg a day dose, then held at that dose due to symptoms  (b) repeat hepatitis panel 10/18/2019 again negative  (10) evusheld started march 2022, repeated Sept 2022   PLAN:  Vertigo Persistent vertigo despite two courses of antibiotics for suspected otitis media. Previous successful treatment with physical therapy for suspected benign paroxysmal positional vertigo (BPPV). -Referral to physical therapy for evaluation and potential treatment.  Muscle Spasms Reports of spasms and stiffness, particularly in the neck. Currently taking magnesium supplements and consuming potassium-rich foods. -Encouraged to increase water intake to prevent dehydration, which may contribute to muscle cramping.  Blood Type Expressed interest in knowing blood type. -Order blood type and screen during next blood work.  Genetic Screening Expressed interest in participating in a no-cost genetic screening program through [facility]. -Advised to follow the link in the email received and enroll in the program.  Bone Density This showed osteopenia. Encouraged Ca/vit D and weight bearing exercises.  Chronic Lymphocytic Leukemia Currently off Venetoclax and on Imbruvica. Blood counts stable. Reports of improved bowel habits since stopping Venetoclax. -Continue Imbruvica. -Plan for follow-up in 3-4 months with labs.  Marital Issues Reports of ongoing marital issues and plans to start counseling. -Encouraged continuation of counseling as planned.  Time spent: 30 min  *Total Encounter Time as defined by the Centers for  Medicare and Medicaid Services includes, in addition to the face-to-face time of a patient visit (documented in the note above) non-face-to-face time: obtaining and reviewing outside history, ordering and reviewing medications, tests or procedures, care coordination (communications with other health care professionals or caregivers) and documentation in the medical record.

## 2023-04-08 NOTE — Addendum Note (Signed)
Addended by: Jackquline Denmark on: 04/08/2023 04:30 PM   Modules accepted: Orders

## 2023-04-13 ENCOUNTER — Encounter: Payer: Self-pay | Admitting: Physical Therapy

## 2023-04-13 ENCOUNTER — Ambulatory Visit: Payer: Medicare Other | Attending: Hematology and Oncology | Admitting: Physical Therapy

## 2023-04-13 ENCOUNTER — Other Ambulatory Visit: Payer: Self-pay

## 2023-04-13 VITALS — BP 114/62 | HR 62

## 2023-04-13 DIAGNOSIS — Z95828 Presence of other vascular implants and grafts: Secondary | ICD-10-CM | POA: Insufficient documentation

## 2023-04-13 DIAGNOSIS — R42 Dizziness and giddiness: Secondary | ICD-10-CM | POA: Diagnosis not present

## 2023-04-13 DIAGNOSIS — R2681 Unsteadiness on feet: Secondary | ICD-10-CM | POA: Diagnosis not present

## 2023-04-13 DIAGNOSIS — C9110 Chronic lymphocytic leukemia of B-cell type not having achieved remission: Secondary | ICD-10-CM | POA: Insufficient documentation

## 2023-04-13 NOTE — Therapy (Signed)
OUTPATIENT PHYSICAL THERAPY VESTIBULAR EVALUATION     Patient Name: Rachel [name] MRN: [id number] DOB:[birth year], 73 y.o., female Today's Date: 04/13/2023  END OF SESSION:  PT End of Session - 04/13/23 1019     Visit Number 1    Number of Visits 7    Date for PT Re-Evaluation 05/18/23    Authorization Type Medicare    PT Start Time 1020    PT Stop Time 1103    PT Time Calculation (min) 43 min    Equipment Utilized During Treatment Other (comment)   no gait belt as performed at seated level   Activity Tolerance Patient tolerated treatment well    Behavior During Therapy [facility] for tasks assessed/performed             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   Diverticulitis    Diverticulitis 06/25/2015   Leukemia (HCC)    Lower back pain    Past Surgical History:  Procedure Laterality Date   BREAST SURGERY  2015   left breast bx   BUNIONECTOMY     right foot   lining of uterus removed     OTHER SURGICAL HISTORY     removal of uterine lining   TONSILLECTOMY     Patient Active Problem List   Diagnosis Date Noted   Visit for routine gyn exam 11/22/2021   Menopausal symptoms 11/22/2021   T9 vertebral fracture (HCC) 07/02/2019   Aortic atherosclerosis (HCC) 01/07/2018   Chronic rhinitis 11/03/2017   Mild intermittent asthma 11/03/2017   History of food allergy 11/03/2017   Lymphoma, 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   Bruising 05/12/2016   Thrombocytopenia (HCC) 05/12/2016   Port catheter in place 10/19/2015   Low grade squamous intraepithelial lesion (LGSIL) on Papanicolaou smear of cervix 01/31/2014   Headache disorder 01/05/2014   CLL (chronic lymphocytic leukemia) (HCC) 07/17/2011    PCP: Lorenda Ishihara, MD REFERRING PROVIDER: Rachel Moulds, MD  REFERRING DIAG: Z95.828 (ICD-10-CM) - Port-A-Cath in place C91.10 (ICD-10-CM) - CLL (chronic lymphocytic leukemia) (HCC), H81.39 - Vertigo   THERAPY DIAG:   Dizziness and giddiness - Plan: PT plan of care cert/re-cert  Unsteadiness on feet - Plan: PT plan of care cert/re-cert  ONSET DATE: 04/08/2023 (referral date)  Rationale for Evaluation and Treatment: Rehabilitation  SUBJECTIVE:   SUBJECTIVE STATEMENT: Patient provides complex history as follows: Patient reports onset of dizziness over the last 3 months. Patient reports that her dizziness is sometimes so bad she has to hold onto the walls. She has intermixed reports of room spinning but later states she has not experienced this for some time. She reports long lifetime history of low BP. Patient reports that 4 years ago in May she fell off a ladder due to passing out after becoming lightheaded. She reports her BP is much better controlled now. Patient reports that she does not wear compression socks. Patient reports that she was treated for BPPV after this fall. Patient reports that her dizziness is different than just feeling lightheaded. Patient also reports a long history of ear infections and fluid in her ears that she has had to take antibiotics for. Patient with some difficulty describing current symptoms.  Pt accompanied by: self  PERTINENT HISTORY: prior history of BPPV, history of CLL for 24 years who is now on Ibrutinib for maintance, reports long history of low BP, fall off latter with syncope ~4 years   PAIN:  Are you having pain? No - can  feel fluid in ears  PRECAUTIONS: Fall and Other: port on R side of upper chst  RED FLAGS: None   WEIGHT BEARING RESTRICTIONS: No  FALLS: Has patient fallen in last 6 months? No  LIVING ENVIRONMENT: Lives with: lives with their spouse Lives in: House/apartment Stairs: Yes: External: small entry steps; none Has following equipment at home: shower chair and Grab bars  PLOF: Independent - retired from working as Engineer, materials at [employer]   PATIENT GOALS: "I just want to not feel vertigo."   OBJECTIVE:  Note: Objective measures were completed  at Evaluation unless otherwise noted.  DIAGNOSTIC FINDINGS:   MR Brain 03/08/2022: IMPRESSION: No acute abnormality   Mild to moderate white matter changes, with progression since 2015.  COGNITION: Overall cognitive status: Within functional limits for tasks assessed - reports some minor short term changes but doesn't seem major    SENSATION: WFL  Cervical ROM:    Active A/PROM (deg) eval  Flexion WFL  Extension WFL - minor restriction but sufficient for [place]  Right lateral flexion WFL  Left lateral flexion WFL  Right rotation WFL  Left rotation WFL  (Blank rows = not tested)  VESTIBULAR ASSESSMENT:  GENERAL OBSERVATION: wears glasses only for reading   SYMPTOM BEHAVIOR:  Subjective history: see above Non-Vestibular symptoms: changes in hearing, neck pain, headaches, tinnitus, and nausea/vomiting Type of dizziness: Blurred Vision, Imbalance (Disequilibrium), Unsteady with head/body turns, and Lightheadedness/Faint  Frequency: reports dizzy when skip meals, has not gotten dizzy this month   Duration: improves when I sit down   Aggravating factors:  standing and moving  Relieving factors:  sitting down  Progression of symptoms: better   OCULOMOTOR EXAM:  Ocular Alignment: normal  Ocular ROM: No Limitations  Spontaneous Nystagmus: absent  Gaze-Induced Nystagmus: right beating with right gaze  Smooth Pursuits: saccades and possible right beat   Saccades: intact and mild delay with vertical down   Convergence/Divergence: ~10  cm   Mild exotropia of L eye with cover and uncover Test of Skew: negative bilaterally     Performed in busy clinic and patient with difficulty maintaining focus with busy background, advise rechecking in quite treatment room, treatment room unavailable at time of eval  VESTIBULAR - OCULAR REFLEX:   Slow VOR: WFL but reprots more dizziness up and down  VOR cancelation: reports dizziness with head turns   Head-Impulse Test: HIT Right:  intermittent mild corrections HIT Left: appears mild positive to the L but very slight and not always reproduced  Dynamic Visual Acuity: Line 8 both static and dynamic: WFL   POSITIONAL TESTING: To be assessed   Vitals:   04/13/23 1031 04/13/23 1100  BP: (!) 116/59 114/62  Pulse: 71 62  Low but patient reports she lives in low numbers, second reading after vestibular exam with patient reporting feeling a little dizzier, did not drop when assessed seated to standing in session  TREATMENT DATE:   Self Care: Discussed etiology of dizziness and advised follow up with opthomologist given tracking noted today, discussed resources as patient reports some mariatel challenges as husband suffering with PTSD, patient politely declined at this time, discussed BP safety and briefly mentioned trial of compression socks or PCP following pending findings in future sessions.   PATIENT EDUCATION: Education details: POC, goal collaboration, examination findings  Person educated: Patient Education method: Explanation Education comprehension: verbalized understanding and needs further education  HOME EXERCISE PROGRAM:  GOALS: Goals reviewed with patient? Yes  LONG TERM GOALS: Target date: 05/18/2023 (STG = LTG due to POC length)   Patient will report demonstrate independence with final HEP in order to maintain current gains and continue to progress after physical therapy discharge.   Baseline: To be provided  Goal status: INITIAL  2.  MSQ to be assessed / LTG written Baseline: To be assessed  Goal status: INITIAL  3.  SOT to be assessed / LTG written Baseline: To be assessed Goal status: INITIAL  4.  Positional testing to be assessed / LTG written as indicated  Baseline: To be assessed  Goal status: INITIAL   ASSESSMENT:  CLINICAL IMPRESSION: Patient is a 73 y.o. female who  was seen today for physical therapy evaluation and treatment for dizziness. Patient testing limited by complex presentation including abnormal occulomotor findings; however, patient session performed in busy treatment session and unclear how many of oculomotor deficits due to clinic distraction versus true deficit; patient testing improved with cued a second time to keep eyes fixated on single test point. Test of skew negative, patient presenting with abnormal convergence and mild exotropia of L eye. Patient did have a fall 4 years ago off ladder when passing out and recent cataract surgery which could explain some of abnormal oculomotor findings. Patient subjective also reports feeling less dizzy when she eats all meals, suggesting nutrition is likely culprit as well. Patient reports dizziness with vertical head nods, VOR cancellation, and mild deviations with head impulse though D[facility] was [facility]. Given inconsistency of findings on eval advise follow up testing next session and adjust POC as needed.  OBJECTIVE IMPAIRMENTS: decreased balance and dizziness.   ACTIVITY LIMITATIONS: bending, transfers, and reach over head  PARTICIPATION LIMITATIONS: cleaning, shopping, and community activity  PERSONAL FACTORS: Age, Sex, and 1-2 comorbidities: see above  are also affecting patient's functional outcome.   REHAB POTENTIAL: Fair unlear etiology of symptoms  CLINICAL DECISION MAKING: Evolving/moderate complexity  EVALUATION COMPLEXITY: Moderate   PLAN:  PT FREQUENCY: 2x/week  PT DURATION: 3 weeks  PLANNED INTERVENTIONS: 97164- PT Re-evaluation, 97110-Therapeutic exercises, 97530- Therapeutic activity, 97112- Neuromuscular re-education, 97535- Self Care, 16109- Manual therapy, [phone]- Gait training, [phone]- Canalith repositioning, and Vestibular training  PLAN FOR NEXT SESSION: retest most of vestibular exam in quiet treatment room, MSQ and positional testing, assess SOT as indicated, pending occulomotor  testing consider follow up with neurology before resuming PT or balance work in meantime    [facility], PT 04/13/2023, 2:45 PM

## 2023-04-20 ENCOUNTER — Encounter: Payer: Self-pay | Admitting: Physical Therapy

## 2023-04-20 ENCOUNTER — Ambulatory Visit: Payer: Medicare Other | Admitting: Physical Therapy

## 2023-04-20 VITALS — BP 192/103 | HR 84

## 2023-04-20 DIAGNOSIS — R2681 Unsteadiness on feet: Secondary | ICD-10-CM

## 2023-04-20 DIAGNOSIS — R42 Dizziness and giddiness: Secondary | ICD-10-CM

## 2023-04-20 DIAGNOSIS — Z95828 Presence of other vascular implants and grafts: Secondary | ICD-10-CM | POA: Diagnosis not present

## 2023-04-20 DIAGNOSIS — C9110 Chronic lymphocytic leukemia of B-cell type not having achieved remission: Secondary | ICD-10-CM | POA: Diagnosis not present

## 2023-04-20 NOTE — Therapy (Signed)
 OUTPATIENT PHYSICAL THERAPY VESTIBULAR TREATMENT     Patient Name: Rachel [name] MRN: [id number] DOB:[birth year], 73 y.o., female Today's Date: 04/22/2023  END OF SESSION:    04/20/23 1021  PT Visits / Re-Eval  Visit Number 2  Number of Visits 7  Date for PT Re-Evaluation 05/18/23  Authorization  Authorization Type Medicare  PT Time Calculation  PT Start Time 1020  PT Stop Time 1103  PT Time Calculation (min) 43 min  PT - End of Session  Equipment Utilized During Treatment Gait belt  Activity Tolerance Patient tolerated treatment well  Behavior During Therapy WFL for tasks assessed/performed    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   Diverticulitis    Diverticulitis 06/25/2015   Leukemia (HCC)    Lower back pain    Past Surgical History:  Procedure Laterality Date   BREAST SURGERY  2015   left breast bx   BUNIONECTOMY     right foot   lining of uterus removed     OTHER SURGICAL HISTORY     removal of uterine lining   TONSILLECTOMY     Patient Active Problem List   Diagnosis Date Noted   Visit for routine gyn exam 11/22/2021   Menopausal symptoms 11/22/2021   T9 vertebral fracture (HCC) 07/02/2019   Aortic atherosclerosis (HCC) 01/07/2018   Chronic rhinitis 11/03/2017   Mild intermittent asthma 11/03/2017   History of food allergy 11/03/2017   Lymphoma, 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   Bruising 05/12/2016   Thrombocytopenia (HCC) 05/12/2016   Port catheter in place 10/19/2015   Low grade squamous intraepithelial lesion (LGSIL) on Papanicolaou smear of cervix 01/31/2014   Headache disorder 01/05/2014   CLL (chronic lymphocytic leukemia) (HCC) 07/17/2011    PCP: Lorenda Ishihara, MD REFERRING PROVIDER: Rachel Moulds, MD  REFERRING DIAG: Z95.828 (ICD-10-CM) - Port-A-Cath in place C91.10 (ICD-10-CM) - CLL (chronic lymphocytic leukemia) (HCC), H81.39 - Vertigo   THERAPY DIAG:  Dizziness and  giddiness  Unsteadiness on feet  ONSET DATE: 04/08/2023 (referral date)  Rationale for Evaluation and Treatment: Rehabilitation  SUBJECTIVE:   SUBJECTIVE STATEMENT: Patient reports that she had one episode when she got up quickly and turned and got very dizzy and unsteady. She reports feeling unsteady at this point.   Pt accompanied by: self  PERTINENT HISTORY: prior history of BPPV, history of CLL for 24 years who is now on Ibrutinib for maintance, reports long history of low BP, fall off latter with syncope ~4 years   PAIN:  Are you having pain? No - can feel fluid in ears  PRECAUTIONS: Fall and Other: port on R side of upper chst  RED FLAGS: None   WEIGHT BEARING RESTRICTIONS: No  FALLS: Has patient fallen in last 6 months? 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  DIAGNOSTIC FINDINGS:   MR Brain 03/08/2022: IMPRESSION: No acute abnormality   Mild to moderate white matter changes, with progression since 2015.  COGNITION: Overall cognitive status: Within functional limits for tasks assessed - reports some minor short term changes but doesn't seem major    TREATMENT DATE:   NMR:   VESTIBULAR  ASSESSMENT:  Performed in quiet treatment room for more accurate results as patient distracted in busy gym on eval  OCULOMOTOR EXAM:  Ocular Alignment: normal  Ocular ROM: No Limitations  Spontaneous Nystagmus: absent  Gaze-Induced Nystagmus: absent  Smooth Pursuits: intact  Saccades: slow and appropriate for age Convergence/Divergence: <7 cm, mild exotropia of L eye at final end (reports a history of lazy eye on  this side)  VESTIBULAR - OCULAR REFLEX:   Slow VOR: Normal  VOR Cancellation: Normal  Head-Impulse Test: HIT Right: positive (mild correction to the right), negative to left  Dynamic Visual Acuity: Static: 7 Dynamic: 6  Within functional limits    POSITIONAL TESTING:  Right Dix-Hallpike: no nystagmus Left Dix-Hallpike: no nystagmus Right Roll Test: no nystagmus Left Roll Test: no nystagmus   MOTION SENSITIVITY:    Motion Sensitivity Quotient Intensity: 0 = none, 1 = Lightheaded, 2 = Mild, 3 = Moderate, 4 = Severe, 5 = Vomiting  Intensity  1. Sitting to supine 1  2. Supine to L side 2 - reports feeling fluid in ears  3. Supine to R side 1  4. Supine to sitting 2   5. L Hallpike-Dix 0  6. Up from L  1  7. R Hallpike-Dix 0  8. Up from R  1  9. Sitting, head  tipped to L knee 0  10. Head up from L  knee 0  11. Sitting, head  tipped to R knee 0  12. Head up from R  knee 0  13. Sitting head turns x5 0  14.Sitting head nods x5 0  15. In stance, 180  turn to L  1  16. In stance, 180  turn to R 0    OTHOSTATICS:   Vitals:   04/20/23 1025 04/20/23 1026  BP: 109/70 105/74  Pulse: 80 83     Seated      Standing  Addended note as vitals entered incorrectly   Brandt-Deroff: trialed 1x bilateral - reports no symptoms   M-CTSIB  Condition 1: Firm Surface, EO 30 Sec, Normal Sway  Condition 2: Firm Surface, EC 30 Sec, Normal Sway  Condition 3: Foam Surface, EO 30 Sec, Normal Sway  Condition 4: Foam Surface, EC 04 Sec, Mod sway   Self Care: Patient reports that she has been drinking 1-3 shots of tequila in the morning but stopped 2 days ago as she knew this was too much; also reports cutting meals/calories to try to lose weight; pt discussed that these may contribute to dizziness as well; discussed if she wanted any resources for managing these concerns and she stated she is [facility] to help   PATIENT EDUCATION: Education details: POC, testing  findings, POC moving forward Person educated: Patient Education method: Explanation Education comprehension: verbalized understanding and needs further education  HOME EXERCISE PROGRAM:  GOALS: Goals reviewed with patient? Yes  LONG TERM GOALS: Target date: 05/18/2023 (STG = LTG due to POC length)   Patient will report demonstrate independence with final HEP in order to maintain current gains and continue to progress after physical therapy discharge.   Baseline: To be provided  Goal status: INITIAL  2.  Patient will report dizziness rolling to the left < 2/5 to demonstrate decreased motion sensitivity.  Baseline: 2/5 Goal status: INITIAL  3.  SOT to be assessed / LTG written Baseline: To be assessed Goal status: INITIAL  4.  Positional testing to be assessed / LTG written as indicated  Baseline:  goals not checked  Goal status: DISCONTINUED   ASSESSMENT:  CLINICAL IMPRESSION: Emphasis of skilled physical therapy services on further vestibular testing. 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 Patient also reporting poor nutrition and heavy drinking in recent history which anticipate may contribute to deficits as well. Patient is currently working on managing this area of her life and starting counseling and may benefit from a few basic exercises and then return to PT if symptoms do not improve following getting nutrition better under control. Continue POC.   OBJECTIVE IMPAIRMENTS: decreased balance and dizziness.   ACTIVITY LIMITATIONS: bending, transfers, and reach over head  PARTICIPATION LIMITATIONS: cleaning, shopping, and community activity  PERSONAL FACTORS: Age, Sex, and 1-2 comorbidities: see above  are also affecting patient's functional outcome.   REHAB POTENTIAL: Fair unlear etiology of symptoms  CLINICAL DECISION MAKING: Evolving/moderate complexity  EVALUATION COMPLEXITY:  Moderate   PLAN:  PT FREQUENCY: 2x/week  PT DURATION: 3 weeks  PLANNED INTERVENTIONS: 97164- PT Re-evaluation, 97110-Therapeutic exercises, 97530- Therapeutic activity, [id number]- Neuromuscular re-education, 97535- Self Care, 16109- Manual therapy, [id number]- Gait training, [phone]- Canalith repositioning, and Vestibular training  PLAN FOR NEXT SESSION:  Create HEP and plan for discharge (recommend return to PT in a few months once nutrition better handled if symptoms not resolved)   Carmelia Bake, PT, DPT 04/22/2023, 9:08 AM

## 2023-04-22 ENCOUNTER — Encounter: Payer: Self-pay | Admitting: Physical Therapy

## 2023-04-22 ENCOUNTER — Ambulatory Visit: Payer: Medicare Other | Admitting: Physical Therapy

## 2023-04-22 VITALS — BP 87/51 | HR 88

## 2023-04-22 DIAGNOSIS — R42 Dizziness and giddiness: Secondary | ICD-10-CM

## 2023-04-22 DIAGNOSIS — Z95828 Presence of other vascular implants and grafts: Secondary | ICD-10-CM | POA: Diagnosis not present

## 2023-04-22 DIAGNOSIS — R2681 Unsteadiness on feet: Secondary | ICD-10-CM | POA: Diagnosis not present

## 2023-04-22 DIAGNOSIS — C9110 Chronic lymphocytic leukemia of B-cell type not having achieved remission: Secondary | ICD-10-CM | POA: Diagnosis not present

## 2023-04-22 NOTE — Therapy (Signed)
 OUTPATIENT PHYSICAL THERAPY VESTIBULAR TREATMENT / DISCHARGE    PHYSICAL THERAPY DISCHARGE SUMMARY  Visits from Start of Care: 3  Current functional level related to goals / functional outcomes: See below   Remaining deficits: Believe dizziness may be in part related to drinking hard liquor in morning, patient expresses interest in quitting but not wanting resources for support at this time, patient also struggling with adequate nutrition for fear of gaining weight, BP low at baseline and has also been more dehydrated due to dizziness, advise follow up with PCP    Education / Equipment: See below   Patient agrees to discharge. Patient goals were partially met. Patient is being discharged due to maximized rehab potential.   Patient Name: Rachel [name] MRN: [id number] DOB:October 05, 1950, 73 y.o., female Today's Date: 04/22/2023  END OF SESSION:  PT End of Session - 04/22/23 1532     Visit Number 3    Number of Visits 7    Date for PT Re-Evaluation 05/18/23    Authorization Type Medicare    PT Start Time 1532    PT Stop Time 1545    PT Time Calculation (min) 13 min    Equipment Utilized During Treatment Gait belt    Activity Tolerance Patient tolerated treatment well    Behavior During Therapy WFL for tasks assessed/performed            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   Diverticulitis    Diverticulitis 06/25/2015   Leukemia (HCC)    Lower back pain    Past Surgical History:  Procedure Laterality Date   BREAST SURGERY  2015   left breast bx   BUNIONECTOMY     right foot   lining of uterus removed     OTHER SURGICAL HISTORY     removal of uterine lining   TONSILLECTOMY     Patient Active Problem List   Diagnosis Date Noted   Visit for routine gyn exam 11/22/2021   Menopausal symptoms 11/22/2021   T9 vertebral fracture (HCC) 07/02/2019   Aortic atherosclerosis (HCC) 01/07/2018   Chronic rhinitis 11/03/2017    Mild intermittent asthma 11/03/2017   History of food allergy 11/03/2017   Lymphoma, 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   Bruising 05/12/2016   Thrombocytopenia (HCC) 05/12/2016   Port catheter in place 10/19/2015   Low grade squamous intraepithelial lesion (LGSIL) on Papanicolaou smear of cervix 01/31/2014   Headache disorder 01/05/2014   CLL (chronic lymphocytic leukemia) (HCC) 07/17/2011    PCP: Lorenda Ishihara, MD REFERRING PROVIDER: Rachel Moulds, MD  REFERRING DIAG: Z95.828 (ICD-10-CM) - Port-A-Cath in place C91.10 (ICD-10-CM) - CLL (chronic lymphocytic leukemia) (HCC), H81.39 - Vertigo   THERAPY DIAG:  Dizziness and giddiness  Unsteadiness on feet  ONSET DATE: 04/08/2023 (referral date)  Rationale for Evaluation and Treatment: Rehabilitation  SUBJECTIVE:   SUBJECTIVE STATEMENT: Patient reports that she has had diarrhea over the last day. She is unsure of the cause. She reports no dizziness episodes. Reports one hard liquor this morning.  Pt accompanied by: self  PERTINENT HISTORY: prior history of BPPV, history of CLL for 24 years who is now on Ibrutinib for maintance, reports long history of low BP, fall off latter with syncope ~4 years   PAIN:  Are you having pain? No - can feel fluid in ears  PRECAUTIONS: Fall and Other: port on R side of upper chst  RED FLAGS: None   WEIGHT BEARING RESTRICTIONS: No  FALLS: Has  patient fallen in last 6 months? 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  DIAGNOSTIC FINDINGS:   MR Brain 03/08/2022: IMPRESSION: No acute abnormality   Mild to moderate white  matter changes, with progression since 2015.  COGNITION: Overall cognitive status: Within functional limits for tasks assessed - reports some minor short term changes but doesn't seem major    TREATMENT DATE:   NMR:   VESTIBULAR ASSESSMENT:  Performed in quiet treatment room for more accurate results as patient distracted in busy gym on eval  OCULOMOTOR EXAM:  Ocular Alignment: normal  Ocular ROM: No Limitations  Spontaneous Nystagmus: absent  Gaze-Induced Nystagmus: absent  Smooth Pursuits: intact  Saccades: slow and appropriate for age Convergence/Divergence: <7 cm, mild exotropia of L eye at final end (reports a history of lazy eye on this side)  VESTIBULAR - OCULAR REFLEX:   Slow VOR: Normal  VOR Cancellation: Normal  Head-Impulse Test: HIT Right: positive (mild correction to the right), negative to left  Dynamic Visual Acuity: Static: 7 Dynamic: 6  Within functional limits    POSITIONAL TESTING:  Right Dix-Hallpike: no nystagmus Left Dix-Hallpike: no nystagmus Right Roll Test: no nystagmus Left Roll Test: no nystagmus   MOTION SENSITIVITY:    Motion Sensitivity Quotient Intensity: 0 = none, 1 = Lightheaded, 2 = Mild, 3 = Moderate, 4 = Severe, 5 = Vomiting  Intensity  1. Sitting to supine 1  2. Supine to L side 2 - reports feeling fluid in ears  3. Supine to R side 1  4. Supine to sitting 2   5. L Hallpike-Dix 0  6. Up from L  1  7. R Hallpike-Dix 0  8. Up from R  1  9. Sitting, head  tipped to L knee 0  10. Head up from L  knee 0  11. Sitting, head  tipped to R knee 0  12. Head up from R  knee 0  13. Sitting head turns x5 0  14.Sitting head nods x5 0  15. In stance, 180  turn to L  1  16. In stance, 180  turn to R 0    OTHOSTATICS:   Vitals:   04/22/23 1535  BP: (!) 87/51  Pulse: 88   Self Care: Patient presenting with low BP in today's session; politely declines additional attempts to increase but does request cup of water which PT  provides, given episode of diarrhea explain that patient may be more dehyradated and encourage her to follow up with PCP, patient is asymptomatic for low bp  Given confounding factors including low BP and heavy drinking without conclusive vestibular exam and no dizziness reports in recent days advise discharge at this time, continue to encourage patient to speak with PCP about support groups to help with drinking as patient expresses interest in quitting. She denies additional resources at this time   PATIENT EDUCATION: Education details: POC, testing findings, POC moving forward Person educated: Patient Education method: Explanation Education comprehension: verbalized understanding and needs further education  HOME EXERCISE PROGRAM:  GOALS: Goals reviewed with patient? Yes  LONG TERM GOALS: Target date: 05/18/2023 (STG = LTG due to POC length)  Patient will report demonstrate independence with final HEP in order to maintain current gains and continue to progress after physical therapy discharge.   Baseline: To be provided, not provided as patient unsafe to perform at home at this time Goal status: DISCONTINUED  2.  Patient will report dizziness rolling to the left < 2/5 to demonstrate decreased motion sensitivity.  Baseline: 2/5, reports no dizziness episodes in recent days Goal status: MET  3.  SOT to be assessed / LTG written Baseline: To be assessed Goal status: DISCONTINUED - held as patient still reporting heavy drinking and will not accurately assess function  4.  Positional testing to be assessed / LTG written as indicated  Baseline: goals not checked  Goal status: DISCONTINUED   ASSESSMENT:  CLINICAL IMPRESSION: Physical therapist advising discharge from PT at this time. BP again low in today's session and advise follow up. Patient also continues to report ongoing drinking in morning which is likely contributing to impairment. Advise looking into support group. Given  concern with safety and drinking did not provide HEP at this time for balance as is not appropriate or safe given ongoing challenge with liquor intake.   OBJECTIVE IMPAIRMENTS: decreased balance and dizziness.   ACTIVITY LIMITATIONS: bending, transfers, and reach over head  PARTICIPATION LIMITATIONS: cleaning, shopping, and community activity  PERSONAL FACTORS: Age, Sex, and 1-2 comorbidities: see above  are also affecting patient's functional outcome.   REHAB POTENTIAL: Fair unlear etiology of symptoms  CLINICAL DECISION MAKING: Evolving/moderate complexity  EVALUATION COMPLEXITY: Moderate   PLAN:  PT FREQUENCY: 2x/week  PT DURATION: 3 weeks  PLANNED INTERVENTIONS: 97164- PT Re-evaluation, 97110-Therapeutic exercises, 97530- Therapeutic activity, [id number]- Neuromuscular re-education, 97535- Self Care, 16109- Manual therapy, [id number]- Gait training, [phone]- Canalith repositioning, and Vestibular training  PLAN FOR NEXT SESSION:  NA - discharged  Carmelia Bake, PT, DPT 04/22/2023, 3:59 PM

## 2023-04-23 ENCOUNTER — Encounter: Payer: Medicare Other | Admitting: Physical Therapy

## 2023-04-23 DIAGNOSIS — H5203 Hypermetropia, bilateral: Secondary | ICD-10-CM | POA: Diagnosis not present

## 2023-04-23 DIAGNOSIS — H53413 Scotoma involving central area, bilateral: Secondary | ICD-10-CM | POA: Diagnosis not present

## 2023-04-23 DIAGNOSIS — H524 Presbyopia: Secondary | ICD-10-CM | POA: Diagnosis not present

## 2023-04-23 DIAGNOSIS — H353131 Nonexudative age-related macular degeneration, bilateral, early dry stage: Secondary | ICD-10-CM | POA: Diagnosis not present

## 2023-05-01 ENCOUNTER — Encounter: Payer: Medicare Other | Admitting: Physical Therapy

## 2023-05-05 ENCOUNTER — Other Ambulatory Visit: Payer: Self-pay | Admitting: *Deleted

## 2023-05-05 DIAGNOSIS — R635 Abnormal weight gain: Secondary | ICD-10-CM

## 2023-05-06 ENCOUNTER — Encounter (INDEPENDENT_AMBULATORY_CARE_PROVIDER_SITE_OTHER): Payer: Self-pay

## 2023-05-08 ENCOUNTER — Encounter: Payer: Medicare Other | Admitting: Physical Therapy

## 2023-05-11 ENCOUNTER — Encounter: Payer: Medicare Other | Admitting: Physical Therapy

## 2023-05-16 ENCOUNTER — Other Ambulatory Visit: Payer: Self-pay | Admitting: Obstetrics

## 2023-05-16 DIAGNOSIS — F5101 Primary insomnia: Secondary | ICD-10-CM

## 2023-05-18 ENCOUNTER — Encounter: Payer: Medicare Other | Admitting: Physical Therapy

## 2023-05-20 ENCOUNTER — Other Ambulatory Visit: Payer: Self-pay | Admitting: *Deleted

## 2023-05-20 DIAGNOSIS — C9110 Chronic lymphocytic leukemia of B-cell type not having achieved remission: Secondary | ICD-10-CM

## 2023-05-20 MED ORDER — IBRUTINIB 280 MG PO TABS: 280.0000 mg | ORAL_TABLET | Freq: Every day | ORAL | 6 refills | Status: AC

## 2023-05-22 ENCOUNTER — Encounter: Payer: Medicare Other | Admitting: Physical Therapy

## 2023-06-02 ENCOUNTER — Ambulatory Visit: Admitting: Obstetrics and Gynecology

## 2023-06-02 ENCOUNTER — Encounter: Payer: Self-pay | Admitting: Obstetrics and Gynecology

## 2023-06-02 ENCOUNTER — Other Ambulatory Visit (HOSPITAL_COMMUNITY)
Admission: RE | Admit: 2023-06-02 | Discharge: 2023-06-02 | Disposition: A | Discharge status: Home / Self Care | Source: Ambulatory Visit | Attending: Obstetrics and Gynecology | Admitting: Obstetrics and Gynecology

## 2023-06-02 VITALS — BP 103/60 | HR 84 | Ht <= 58 in | Wt 127.0 lb

## 2023-06-02 DIAGNOSIS — N898 Other specified noninflammatory disorders of vagina: Secondary | ICD-10-CM

## 2023-06-02 DIAGNOSIS — N952 Postmenopausal atrophic vaginitis: Secondary | ICD-10-CM | POA: Diagnosis not present

## 2023-06-02 DIAGNOSIS — Z01419 Encounter for gynecological examination (general) (routine) without abnormal findings: Secondary | ICD-10-CM | POA: Diagnosis not present

## 2023-06-02 DIAGNOSIS — R87610 Atypical squamous cells of undetermined significance on cytologic smear of cervix (ASC-US): Secondary | ICD-10-CM | POA: Diagnosis not present

## 2023-06-02 DIAGNOSIS — R87810 Cervical high risk human papillomavirus (HPV) DNA test positive: Secondary | ICD-10-CM

## 2023-06-02 MED ORDER — ESTRADIOL 0.1 MG/GM VA CREA: TOPICAL_CREAM | VAGINAL | 4 refills | Status: DC

## 2023-06-02 NOTE — Patient Instructions (Signed)
 For the cream use 1 gram in the vagina every night for 2 weeks then 0.5 to 1 gram per vagina 1 to 3 evenings per week. A small amount can be placed on the inner portion of the labia with the fingers, wash hands after application

## 2023-06-02 NOTE — Progress Notes (Signed)
 GYNECOLOGY ANNUAL PREVENTATIVE CARE ENCOUNTER NOTE  History:     Rachel [name] is a 73 y.o. G3P0 female here for a routine annual gynecologic exam.  Current complaints: vaginal irritation.   Denies abnormal vaginal bleeding, discharge, pelvic pain, problems with intercourse or other gynecologic concerns.    Gynecologic History No LMP recorded. Patient is postmenopausal. Contraception: post menopausal status Last Pap: 9/23. Results were: ASCUS with positive HPV Last mammogram: 9/24. Results were: normal  Obstetric History OB History  Gravida Para Term Preterm AB Living  3     3  SAB IAB Ectopic Multiple Live Births      3    # Outcome Date GA Lbr Len/2nd Weight Sex Type Anes PTL Lv  3 Gravida 09/02/76    M    LIV  2 Gravida 02/12/74    M    LIV  1 Gravida 10/08/72    M   Y     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   Diverticulitis    Diverticulitis 06/25/2015   Leukemia (HCC)    Lower back pain    Macular degeneration     Past Surgical History:  Procedure Laterality Date   BREAST SURGERY  2015   left breast bx   BUNIONECTOMY     right foot   lining of uterus removed     OTHER SURGICAL HISTORY     removal of uterine lining   TONSILLECTOMY      Current Outpatient Medications on File Prior to Visit  Medication Sig Dispense Refill   acetaminophen (TYLENOL) 500 MG tablet Take 500-1,000 mg by mouth every 6 (six) hours as needed for headache.      albuterol (VENTOLIN HFA) 108 (90 Base) MCG/ACT inhaler Inhale 1-2 puffs into the lungs every 6 (six) hours as needed for shortness of breath or wheezing.     azelastine (ASTELIN) 0.1 % nasal spray Place into both nostrils 2 (two) times daily. Use in each nostril as directed     Calcium Citrate-Vitamin D 200-250 MG-UNIT TABS Take 1 tablet by mouth 4 (four) times daily.     Cholecalciferol (VITAMIN D-3) 125 MCG (5000 UT) TABS Take 2 tablets by mouth daily.     ibrutinib  (IMBRUVICA) 280 MG tablet Take 1 tablet (280 mg total) by mouth daily. Take with a glass of water. 56 tablet 6   lidocaine-prilocaine (EMLA) cream Apply a nickel size amount to skin on top of port- cover with saran wrap at least 1 hour before access 30 g 0   Magnesium 200 MG TABS Take 2 tablets by mouth daily.     montelukast (SINGULAIR) 10 MG tablet Take 1 tablet (10 mg total) by mouth at bedtime. 90 tablet 3   rosuvastatin (CRESTOR) 5 MG tablet Take 5 mg by mouth daily.     traZODone (DESYREL) 100 MG tablet TAKE 1 TABLET AT BEDTIME 90 tablet 3   valACYclovir (VALTREX) 1000 MG tablet Take 1 tablet (1,000 mg total) by mouth daily. TAKE 1 TABLET BY MOUTH TWICE DAILY FOR 5 DAYS THEN TAKE 1 TABLET BY MOUTH DAILY 90 tablet 1   Zinc 30 MG TABS Take 1 tablet by mouth daily.     betamethasone dipropionate (DIPROLENE) 0.05 % ointment Apply topically 2 (two) times daily. (Patient not taking: Reported on 06/02/2023)     venetoclax (VENCLEXTA) 100 MG tablet TAKE 1 TABLET (100 MG) BY MOUTH DAILY. TAKE WITH FOOD AND WATER  AT APPROXIMATELY THE SAME TIME EACH DAY. (Patient not taking: Reported on 04/13/2023) 30 tablet 6   Current Facility-Administered Medications on File Prior to Visit  Medication Dose Route Frequency Provider Last Rate Last Admin   0.9 %  sodium chloride infusion   Intravenous PRN Causey, Larna Daughters, NP        Allergies  Allergen Reactions   Penicillins Anaphylaxis    "stopped breathing" "drops my heartbeat" "I just pass out"   Lidocaine-Tetracaine-Epineph Other (See Comments)    Pt came in for CODE STROKE after receiving Lidocaine with Epi in dental office. No stroke, but had symptoms.     Social History:  reports that she has never smoked. She has never been exposed to tobacco smoke. She has never used smokeless tobacco. She reports current alcohol use. She reports that she does not use drugs.  Family History  Problem Relation Age of Onset   Depression Son     The following  portions of the patient's history were reviewed and updated as appropriate: allergies, current medications, past family history, past medical history, past social history, past surgical history and problem list.  Review of Systems Pertinent items noted in HPI and remainder of comprehensive ROS otherwise negative.  Physical Exam:  BP 103/60   Pulse 84   Ht 4\' 9"  (1.448 m)   Wt 127 lb (57.6 kg)   BMI 27.48 kg/m  CONSTITUTIONAL: Well-developed, well-nourished female in no acute distress.  HENT:  Normocephalic, atraumatic, External right and left ear normal. Oropharynx is clear and moist EYES: Conjunctivae and EOM are normal.  NECK: Normal range of motion, supple, no masses.  Normal thyroid.  SKIN: Skin is warm and dry. No rash noted. Not diaphoretic. No erythema. No pallor. MUSCULOSKELETAL: Normal range of motion. No tenderness.  No cyanosis, clubbing, or edema.  2+ distal pulses. NEUROLOGIC: Alert and oriented to person, place, and time. Normal reflexes, muscle tone coordination.  PSYCHIATRIC: Normal mood and affect. Normal behavior. Normal judgment and thought content. CARDIOVASCULAR: Normal heart rate noted, regular rhythm RESPIRATORY: Clear to auscultation bilaterally. Effort and breath sounds normal, no problems with respiration noted. BREASTS: Symmetric in size. No masses, tenderness, skin changes, nipple drainage, or lymphadenopathy bilaterally. Performed in the presence of a chaperone.  Port palpated just above right breast ABDOMEN: Soft, no distention noted.  No tenderness, rebound or guarding.  PELVIC: Normal appearing external genitalia and urethral meatus; normal appearing vaginal mucosa and cervix.  No abnormal discharge noted.  Pap smear obtained.  Vaginal atrophy noted.  Normal uterine size, no other palpable masses, no uterine or adnexal tenderness.  Performed in the presence of a chaperone.   Assessment and Plan:    1. Women's annual routine gynecological examination  (Primary) Normal annual exam  - Cytology - PAP  2. Vaginal atrophy Will treat atrophy with estrogen cream  - estradiol (ESTRACE VAGINAL) 0.1 MG/GM vaginal cream; 1 gram per vagina for 2 weeks each evening, then 0.5 to 1 gram per vagina 1-3 nights per week for maintenance  Dispense: 42.5 g; Refill: 4  3. Vaginal irritation Pt may put a thin layer of estrogen cream on the inner portion of the labia minora. - estradiol (ESTRACE VAGINAL) 0.1 MG/GM vaginal cream; 1 gram per vagina for 2 weeks each evening, then 0.5 to 1 gram per vagina 1-3 nights per week for maintenance  Dispense: 42.5 g; Refill: 4  4. ASCUS with positive high risk HPV cervical Trial of vaginal estrogen to help make vaginal tissue  more healthy and to clear HPV. Repap in 8 months. - estradiol (ESTRACE VAGINAL) 0.1 MG/GM vaginal cream; 1 gram per vagina for 2 weeks each evening, then 0.5 to 1 gram per vagina 1-3 nights per week for maintenance  Dispense: 42.5 g; Refill: 4  Will follow up results of pap smear and manage accordingly. Routine preventative health maintenance measures emphasized. Pt will schedule mammogram directly with [facility]. Please refer to After Visit Summary for other counseling recommendations.      Mariel Aloe, MD, FACOG Obstetrician & Gynecologist, [facility] for [facility], [facility]

## 2023-06-02 NOTE — Progress Notes (Signed)
 Discomfort in vulvar area. Has not used anything on the area.

## 2023-06-11 LAB — CYTOLOGY - PAP: Diagnosis: NEGATIVE

## 2023-06-12 DIAGNOSIS — H353131 Nonexudative age-related macular degeneration, bilateral, early dry stage: Secondary | ICD-10-CM | POA: Diagnosis not present

## 2023-06-12 DIAGNOSIS — H53413 Scotoma involving central area, bilateral: Secondary | ICD-10-CM | POA: Diagnosis not present

## 2023-06-15 ENCOUNTER — Other Ambulatory Visit: Payer: Self-pay

## 2023-06-15 DIAGNOSIS — N898 Other specified noninflammatory disorders of vagina: Secondary | ICD-10-CM

## 2023-06-15 DIAGNOSIS — R87610 Atypical squamous cells of undetermined significance on cytologic smear of cervix (ASC-US): Secondary | ICD-10-CM

## 2023-06-15 DIAGNOSIS — N952 Postmenopausal atrophic vaginitis: Secondary | ICD-10-CM

## 2023-06-15 MED ORDER — ESTRADIOL 0.1 MG/GM VA CREA: TOPICAL_CREAM | VAGINAL | 4 refills | Status: AC

## 2023-06-15 MED ORDER — ESTRADIOL 0.1 MG/GM VA CREA: TOPICAL_CREAM | VAGINAL | 4 refills | Status: DC

## 2023-06-15 NOTE — Progress Notes (Signed)
 Reorder of estradiol  cream sent to pharmacy. Pharmacy refused to fill due to pt allergy, but pt does not have any related allergy that she cannot take estradiol  cream after consulting with MD. Made not to pharmacy on prescription.

## 2023-06-17 ENCOUNTER — Encounter: Payer: Self-pay | Admitting: Obstetrics and Gynecology

## 2023-07-06 ENCOUNTER — Other Ambulatory Visit: Payer: Self-pay | Admitting: Hematology and Oncology

## 2023-07-06 DIAGNOSIS — C9110 Chronic lymphocytic leukemia of B-cell type not having achieved remission: Secondary | ICD-10-CM

## 2023-08-05 DIAGNOSIS — J45909 Unspecified asthma, uncomplicated: Secondary | ICD-10-CM | POA: Diagnosis not present

## 2023-08-05 DIAGNOSIS — M85859 Other specified disorders of bone density and structure, unspecified thigh: Secondary | ICD-10-CM | POA: Diagnosis not present

## 2023-08-05 DIAGNOSIS — I700 Atherosclerosis of aorta: Secondary | ICD-10-CM | POA: Diagnosis not present

## 2023-08-05 DIAGNOSIS — C9110 Chronic lymphocytic leukemia of B-cell type not having achieved remission: Secondary | ICD-10-CM | POA: Diagnosis not present

## 2023-08-05 DIAGNOSIS — F3341 Major depressive disorder, recurrent, in partial remission: Secondary | ICD-10-CM | POA: Diagnosis not present

## 2023-08-06 ENCOUNTER — Inpatient Hospital Stay: Payer: Medicare Other | Attending: Hematology and Oncology

## 2023-08-06 ENCOUNTER — Other Ambulatory Visit: Payer: Self-pay

## 2023-08-06 VITALS — BP 121/59 | HR 72 | Temp 98.8°F | Resp 15

## 2023-08-06 DIAGNOSIS — C9110 Chronic lymphocytic leukemia of B-cell type not having achieved remission: Secondary | ICD-10-CM

## 2023-08-06 DIAGNOSIS — Z95828 Presence of other vascular implants and grafts: Secondary | ICD-10-CM

## 2023-08-06 LAB — CMP (CANCER CENTER ONLY)
ALT: 28 U/L (ref 0–44)
AST: 32 U/L (ref 15–41)
Albumin: 4 g/dL (ref 3.5–5.0)
Alkaline Phosphatase: 91 U/L (ref 38–126)
Anion gap: 10 (ref 5–15)
BUN: 14 mg/dL (ref 8–23)
CO2: 28 mmol/L (ref 22–32)
Calcium: 9.9 mg/dL (ref 8.9–10.3)
Chloride: 103 mmol/L (ref 98–111)
Creatinine: 0.86 mg/dL (ref 0.44–1.00)
GFR, Estimated: >60 mL/min (ref >60)
Glucose, Bld: 97 mg/dL (ref 70–99)
Potassium: 4.2 mmol/L (ref 3.5–5.1)
Sodium: 141 mmol/L (ref 135–145)
Total Bilirubin: 1.1 mg/dL (ref 0.0–1.2)
Total Protein: 6.1 g/dL — ABNORMAL LOW (ref 6.5–8.1)

## 2023-08-06 LAB — CBC WITH DIFFERENTIAL (CANCER CENTER ONLY)
Abs Immature Granulocytes: 0.04 10*3/uL (ref 0.00–0.07)
Basophils Absolute: 0 10*3/uL (ref 0.0–0.1)
Basophils Relative: 1 %
Eosinophils Absolute: 0.1 10*3/uL (ref 0.0–0.5)
Eosinophils Relative: 1 %
HCT: 43.3 % (ref 36.0–46.0)
Hemoglobin: 14.7 g/dL (ref 12.0–15.0)
Immature Granulocytes: 1 %
Lymphocytes Relative: 26 %
Lymphs Abs: 1.5 10*3/uL (ref 0.7–4.0)
MCH: 31.3 pg (ref 26.0–34.0)
MCHC: 33.9 g/dL (ref 30.0–36.0)
MCV: 92.3 fL (ref 80.0–100.0)
Monocytes Absolute: 0.6 10*3/uL (ref 0.1–1.0)
Monocytes Relative: 9 %
Neutro Abs: 3.7 10*3/uL (ref 1.7–7.7)
Neutrophils Relative %: 62 %
Platelet Count: 116 10*3/uL — ABNORMAL LOW (ref 150–400)
RBC: 4.69 MIL/uL (ref 3.87–5.11)
RDW: 11.8 % (ref 11.5–15.5)
WBC Count: 5.9 10*3/uL (ref 4.0–10.5)
nRBC: 0 % (ref 0.0–0.2)

## 2023-08-06 LAB — LACTATE DEHYDROGENASE: LDH: 155 U/L (ref 98–192)

## 2023-08-06 LAB — TYPE AND SCREEN
ABO/RH(D): O POS
Antibody Screen: NEGATIVE

## 2023-08-06 MED ORDER — HEPARIN SOD (PORK) LOCK FLUSH 100 UNIT/ML IV SOLN
500.0000 [IU] | Freq: Once | INTRAVENOUS | Status: AC
  Administered 2023-08-06: 500 [IU]

## 2023-08-06 MED ORDER — SODIUM CHLORIDE 0.9% FLUSH
10.0000 mL | Freq: Once | INTRAVENOUS | Status: AC
  Administered 2023-08-06: 10 mL

## 2023-09-24 DIAGNOSIS — R0683 Snoring: Secondary | ICD-10-CM | POA: Diagnosis not present

## 2023-09-24 DIAGNOSIS — E785 Hyperlipidemia, unspecified: Secondary | ICD-10-CM | POA: Diagnosis not present

## 2023-09-24 DIAGNOSIS — F3341 Major depressive disorder, recurrent, in partial remission: Secondary | ICD-10-CM | POA: Diagnosis not present

## 2023-10-20 DIAGNOSIS — R0683 Snoring: Secondary | ICD-10-CM | POA: Diagnosis not present

## 2023-11-04 DIAGNOSIS — Z1231 Encounter for screening mammogram for malignant neoplasm of breast: Secondary | ICD-10-CM | POA: Diagnosis not present

## 2023-11-13 ENCOUNTER — Encounter: Payer: Self-pay | Admitting: Hematology and Oncology

## 2023-11-24 DIAGNOSIS — G4733 Obstructive sleep apnea (adult) (pediatric): Secondary | ICD-10-CM | POA: Diagnosis not present

## 2023-12-16 DIAGNOSIS — L570 Actinic keratosis: Secondary | ICD-10-CM | POA: Diagnosis not present

## 2023-12-21 DIAGNOSIS — H6690 Otitis media, unspecified, unspecified ear: Secondary | ICD-10-CM | POA: Diagnosis not present

## 2023-12-21 DIAGNOSIS — Z23 Encounter for immunization: Secondary | ICD-10-CM | POA: Diagnosis not present

## 2023-12-24 DIAGNOSIS — G4733 Obstructive sleep apnea (adult) (pediatric): Secondary | ICD-10-CM | POA: Diagnosis not present

## 2024-01-07 DIAGNOSIS — L989 Disorder of the skin and subcutaneous tissue, unspecified: Secondary | ICD-10-CM | POA: Diagnosis not present

## 2024-01-07 DIAGNOSIS — L209 Atopic dermatitis, unspecified: Secondary | ICD-10-CM | POA: Diagnosis not present

## 2024-01-10 ENCOUNTER — Emergency Department
Admission: EM | Admit: 2024-01-10 | Discharge: 2024-01-10 | Disposition: A | Discharge status: Home / Self Care | Attending: Emergency Medicine | Admitting: Emergency Medicine

## 2024-01-10 ENCOUNTER — Other Ambulatory Visit: Payer: Self-pay

## 2024-01-10 ENCOUNTER — Emergency Department

## 2024-01-10 DIAGNOSIS — R519 Headache, unspecified: Secondary | ICD-10-CM | POA: Diagnosis not present

## 2024-01-10 DIAGNOSIS — M47812 Spondylosis without myelopathy or radiculopathy, cervical region: Secondary | ICD-10-CM | POA: Diagnosis not present

## 2024-01-10 DIAGNOSIS — R1012 Left upper quadrant pain: Secondary | ICD-10-CM | POA: Diagnosis not present

## 2024-01-10 DIAGNOSIS — K5730 Diverticulosis of large intestine without perforation or abscess without bleeding: Secondary | ICD-10-CM | POA: Diagnosis not present

## 2024-01-10 DIAGNOSIS — Y92410 Unspecified street and highway as the place of occurrence of the external cause: Secondary | ICD-10-CM | POA: Insufficient documentation

## 2024-01-10 DIAGNOSIS — R109 Unspecified abdominal pain: Secondary | ICD-10-CM

## 2024-01-10 DIAGNOSIS — I700 Atherosclerosis of aorta: Secondary | ICD-10-CM | POA: Diagnosis not present

## 2024-01-10 DIAGNOSIS — M7918 Myalgia, other site: Secondary | ICD-10-CM | POA: Diagnosis not present

## 2024-01-10 DIAGNOSIS — R748 Abnormal levels of other serum enzymes: Secondary | ICD-10-CM | POA: Diagnosis not present

## 2024-01-10 DIAGNOSIS — R42 Dizziness and giddiness: Secondary | ICD-10-CM | POA: Insufficient documentation

## 2024-01-10 DIAGNOSIS — M4183 Other forms of scoliosis, cervicothoracic region: Secondary | ICD-10-CM | POA: Diagnosis not present

## 2024-01-10 DIAGNOSIS — R1011 Right upper quadrant pain: Secondary | ICD-10-CM | POA: Insufficient documentation

## 2024-01-10 DIAGNOSIS — R0789 Other chest pain: Secondary | ICD-10-CM | POA: Insufficient documentation

## 2024-01-10 DIAGNOSIS — S199XXA Unspecified injury of neck, initial encounter: Secondary | ICD-10-CM | POA: Diagnosis not present

## 2024-01-10 DIAGNOSIS — M4802 Spinal stenosis, cervical region: Secondary | ICD-10-CM | POA: Diagnosis not present

## 2024-01-10 DIAGNOSIS — R1030 Lower abdominal pain, unspecified: Secondary | ICD-10-CM | POA: Diagnosis not present

## 2024-01-10 DIAGNOSIS — S161XXA Strain of muscle, fascia and tendon at neck level, initial encounter: Secondary | ICD-10-CM | POA: Diagnosis not present

## 2024-01-10 DIAGNOSIS — M542 Cervicalgia: Secondary | ICD-10-CM | POA: Diagnosis present

## 2024-01-10 DIAGNOSIS — J45909 Unspecified asthma, uncomplicated: Secondary | ICD-10-CM | POA: Insufficient documentation

## 2024-01-10 DIAGNOSIS — D696 Thrombocytopenia, unspecified: Secondary | ICD-10-CM | POA: Diagnosis not present

## 2024-01-10 LAB — CBC WITH DIFFERENTIAL/PLATELET
Abs Immature Granulocytes: 0.01 K/uL (ref 0.00–0.07)
Basophils Absolute: 0 K/uL (ref 0.0–0.1)
Basophils Relative: 1 %
Eosinophils Absolute: 0.1 K/uL (ref 0.0–0.5)
Eosinophils Relative: 1 %
HCT: 43.2 % (ref 36.0–46.0)
Hemoglobin: 14.2 g/dL (ref 12.0–15.0)
Immature Granulocytes: 0 %
Lymphocytes Relative: 23 %
Lymphs Abs: 1.2 K/uL (ref 0.7–4.0)
MCH: 30.9 pg (ref 26.0–34.0)
MCHC: 32.9 g/dL (ref 30.0–36.0)
MCV: 93.9 fL (ref 80.0–100.0)
Monocytes Absolute: 0.5 K/uL (ref 0.1–1.0)
Monocytes Relative: 11 %
Neutro Abs: 3.2 K/uL (ref 1.7–7.7)
Neutrophils Relative %: 64 %
Platelets: 130 K/uL — ABNORMAL LOW (ref 150–400)
RBC: 4.6 MIL/uL (ref 3.87–5.11)
RDW: 11.9 % (ref 11.5–15.5)
WBC: 5 K/uL (ref 4.0–10.5)
nRBC: 0 % (ref 0.0–0.2)

## 2024-01-10 LAB — TROPONIN T, HIGH SENSITIVITY: Troponin T High Sensitivity: <15 ng/L (ref 0–19)

## 2024-01-10 LAB — COMPREHENSIVE METABOLIC PANEL WITH GFR
ALT: 27 U/L (ref 0–44)
AST: 29 U/L (ref 15–41)
Albumin: 4 g/dL (ref 3.5–5.0)
Alkaline Phosphatase: 90 U/L (ref 38–126)
Anion gap: 8 (ref 5–15)
BUN: 15 mg/dL (ref 8–23)
CO2: 27 mmol/L (ref 22–32)
Calcium: 9 mg/dL (ref 8.9–10.3)
Chloride: 106 mmol/L (ref 98–111)
Creatinine, Ser: 1.02 mg/dL — ABNORMAL HIGH (ref 0.44–1.00)
GFR, Estimated: 57 mL/min — ABNORMAL LOW (ref >60)
Glucose, Bld: 91 mg/dL (ref 70–99)
Potassium: 4.1 mmol/L (ref 3.5–5.1)
Sodium: 142 mmol/L (ref 135–145)
Total Bilirubin: 0.5 mg/dL (ref 0.0–1.2)
Total Protein: 5.5 g/dL — ABNORMAL LOW (ref 6.5–8.1)

## 2024-01-10 LAB — LIPASE, BLOOD: Lipase: 60 U/L — ABNORMAL HIGH (ref 11–51)

## 2024-01-10 MED ORDER — IOHEXOL 300 MG/ML  SOLN
100.0000 mL | Freq: Once | INTRAMUSCULAR | Status: AC | PRN
  Administered 2024-01-10: 100 mL via INTRAVENOUS

## 2024-01-10 MED ORDER — OXYCODONE HCL 5 MG PO CAPS: 5.0000 mg | ORAL_CAPSULE | Freq: Three times a day (TID) | ORAL | 0 refills | Status: AC | PRN

## 2024-01-10 MED ORDER — OXYCODONE-ACETAMINOPHEN 5-325 MG PO TABS
1.0000 | ORAL_TABLET | Freq: Once | ORAL | Status: AC
  Administered 2024-01-10: 1 via ORAL
  Filled 2024-01-10: qty 1
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
## 2024-01-10 NOTE — Discharge Instructions (Signed)
 You can take 650 mg of Tylenol  every 6 hours as needed for pain.  Please make sure to reserve the oxycodone  for severe breakthrough pain, please do not drive or operate heavy machinery when you are on the oxycodone  since it can make you sleepy and you might be prone to falls.  Please try to take it at night if possible.  Please follow-up with your primary care doctor this week to get reassessed.

## 2024-01-13 ENCOUNTER — Encounter: Payer: Self-pay | Admitting: Oncology

## 2024-01-25 DIAGNOSIS — L570 Actinic keratosis: Secondary | ICD-10-CM | POA: Diagnosis not present

## 2024-01-25 DIAGNOSIS — B353 Tinea pedis: Secondary | ICD-10-CM | POA: Diagnosis not present

## 2024-03-17 ENCOUNTER — Telehealth: Payer: Self-pay | Admitting: Hematology and Oncology

## 2024-03-17 NOTE — Telephone Encounter (Signed)
 I returned patient's phone call and she requested to schedule port flush and MD appointment for 03/18/2024.

## 2024-03-18 ENCOUNTER — Encounter: Payer: Self-pay | Admitting: Oncology

## 2024-03-18 ENCOUNTER — Inpatient Hospital Stay: Attending: Hematology and Oncology

## 2024-03-18 ENCOUNTER — Inpatient Hospital Stay: Admitting: Hematology and Oncology

## 2024-03-18 ENCOUNTER — Inpatient Hospital Stay

## 2024-03-18 VITALS — BP 118/51 | HR 66 | Temp 97.9°F | Resp 17 | Wt 125.7 lb

## 2024-03-18 DIAGNOSIS — Z79890 Hormone replacement therapy (postmenopausal): Secondary | ICD-10-CM | POA: Insufficient documentation

## 2024-03-18 DIAGNOSIS — C9110 Chronic lymphocytic leukemia of B-cell type not having achieved remission: Secondary | ICD-10-CM | POA: Diagnosis not present

## 2024-03-18 DIAGNOSIS — R432 Parageusia: Secondary | ICD-10-CM | POA: Insufficient documentation

## 2024-03-18 DIAGNOSIS — J019 Acute sinusitis, unspecified: Secondary | ICD-10-CM | POA: Insufficient documentation

## 2024-03-18 DIAGNOSIS — B079 Viral wart, unspecified: Secondary | ICD-10-CM | POA: Insufficient documentation

## 2024-03-18 LAB — CBC WITH DIFFERENTIAL/PLATELET
Abs Immature Granulocytes: 0.15 K/uL — ABNORMAL HIGH (ref 0.00–0.07)
Basophils Absolute: 0 K/uL (ref 0.0–0.1)
Basophils Relative: 0 %
Eosinophils Absolute: 0.1 K/uL (ref 0.0–0.5)
Eosinophils Relative: 1 %
HCT: 45.4 % (ref 36.0–46.0)
Hemoglobin: 15.4 g/dL — ABNORMAL HIGH (ref 12.0–15.0)
Immature Granulocytes: 1 %
Lymphocytes Relative: 13 %
Lymphs Abs: 1.3 K/uL (ref 0.7–4.0)
MCH: 31.4 pg (ref 26.0–34.0)
MCHC: 33.9 g/dL (ref 30.0–36.0)
MCV: 92.7 fL (ref 80.0–100.0)
Monocytes Absolute: 0.4 K/uL (ref 0.1–1.0)
Monocytes Relative: 4 %
Neutro Abs: 8.5 K/uL — ABNORMAL HIGH (ref 1.7–7.7)
Neutrophils Relative %: 81 %
Platelets: 135 K/uL — ABNORMAL LOW (ref 150–400)
RBC: 4.9 MIL/uL (ref 3.87–5.11)
RDW: 11.9 % (ref 11.5–15.5)
WBC: 10.4 K/uL (ref 4.0–10.5)
nRBC: 0 % (ref 0.0–0.2)

## 2024-03-18 LAB — CMP (CANCER CENTER ONLY)
ALT: 47 U/L — ABNORMAL HIGH (ref 0–44)
AST: 38 U/L (ref 15–41)
Albumin: 4.4 g/dL (ref 3.5–5.0)
Alkaline Phosphatase: 99 U/L (ref 38–126)
Anion gap: 10 (ref 5–15)
BUN: 15 mg/dL (ref 8–23)
CO2: 26 mmol/L (ref 22–32)
Calcium: 9.4 mg/dL (ref 8.9–10.3)
Chloride: 104 mmol/L (ref 98–111)
Creatinine: 1 mg/dL (ref 0.44–1.00)
GFR, Estimated: 58 mL/min — ABNORMAL LOW
Glucose, Bld: 118 mg/dL — ABNORMAL HIGH (ref 70–99)
Potassium: 4.3 mmol/L (ref 3.5–5.1)
Sodium: 141 mmol/L (ref 135–145)
Total Bilirubin: 0.4 mg/dL (ref 0.0–1.2)
Total Protein: 6.5 g/dL (ref 6.5–8.1)

## 2024-03-18 LAB — LACTATE DEHYDROGENASE: LDH: 177 U/L (ref 105–235)

## 2024-03-18 NOTE — Progress Notes (Signed)
 " [facility]  [phone] [phone]     ID: Rachel [name]   DOB: 06-Nov-1950  MR#: [id number]  RDW#:[id number]  Patient Care Team: Elliot Charm, MD as PCP - General (Internal Medicine) Rudy Carlin LABOR, MD as Consulting Physician (Obstetrics and Gynecology) Debby Dorn MATSU, MD as Consulting Physician (Neurosurgery)  CHIEF COMPLAINT: Chronic lymphocytic leukemia  CURRENT TREATMENT:  Ibrutinib   INTERVAL HISTORY:  Discussed the use of AI scribe software for clinical note transcription with the patient, who gave verbal consent to proceed.  History of Present Illness    The patient, with a history of CLL for 24 years, who is now on Ibrutinib  alone comes here for follow up. 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  She continues daily ibrutinib  for CLL, primarily for management of warts on her fingers and toes, and has not missed any doses. She denies fevers, drenching night sweats, or significant weight loss. Her weight is stable at 125 lbs, though she reports decreased oral intake due to intermittent dysgeusia and episodes of gastrointestinal upset. She is abstaining from alcohol due to concurrent antibiotic therapy.  Over the past month, she developed persistent upper respiratory symptoms following travel to [place] , including ongoing cough with initially clear, then green, now clear again, sputum production, and persistent nasal congestion. She is on her second course of antibiotics (currently doxycycline ) and prednisone , with five doses remaining. She uses nasal spray and neti pot without resolution of symptoms.  Rest of the pertinent 10 point ROS reviewed and negative.   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 She has been treated with Imbruvica  followed by Imbruvica  with venetoclax .  PAST MEDICAL HISTORY: 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   Diverticulitis    Diverticulitis 06/25/2015   Leukemia (HCC)    Lower back pain    Macular degeneration     PAST SURGICAL HISTORY: Past Surgical History:  Procedure Laterality Date   BREAST SURGERY  2015   left breast bx   BUNIONECTOMY     right foot   lining of uterus removed     OTHER SURGICAL HISTORY     removal of uterine lining   TONSILLECTOMY      FAMILY HISTORY Family History  Problem Relation Age of Onset   Depression Son   The patient's mother will turn 40 on March 2021   GYNECOLOGIC HISTORY: Menarche age 73, first live birth age 53, she is GX P3. She stopped having periods in 1999 after endometrial stripping. She continues on hormone replacement.   SOCIAL HISTORY: Her husband Unice is retired from [employer].  The patient and her husband are now living in [place].  Her son Elnoria Unice lives in [place]; he works for a [employer]. A second son lives in [place] and has two children. Son Sula lives in [place]. She has 8 grandchildren, the oldest is 33 and the youngest is 6.  ADVANCED DIRECTIVES: In place   HEALTH MAINTENANCE: Social History   Tobacco Use   Smoking status: Never    Passive exposure: Never   Smokeless tobacco: Never  Vaping Use   Vaping status: Never Used  Substance Use Topics  Alcohol use: Yes    Comment: 1 shot liquor daily   Drug use: No     Colonoscopy: 12/2018 (Dr. Kristie), repeat in 10 years  PAP: December 2012/ Rudy  Bone density: Due  Lipid panel: per Dr Gwenn  Allergies  Allergen Reactions   Penicillins Anaphylaxis    stopped breathing drops my heartbeat I just pass out    Lidocaine -Tetracaine-Epineph Other (See Comments)    Pt came in for CODE STROKE after receiving Lidocaine  with Epi in dental office. No stroke, but had symptoms.     Current Outpatient Medications  Medication Sig Dispense Refill   acetaminophen  (TYLENOL ) 500 MG tablet Take 500-1,000 mg by mouth every 6 (six) hours as needed for headache.      albuterol  (VENTOLIN  HFA) 108 (90 Base) MCG/ACT inhaler Inhale 1-2 puffs into the lungs every 6 (six) hours as needed for shortness of breath or wheezing.     azelastine  (ASTELIN ) 0.1 % nasal spray Place into both nostrils 2 (two) times daily. Use in each nostril as directed     betamethasone dipropionate (DIPROLENE) 0.05 % ointment Apply topically 2 (two) times daily. (Patient not taking: Reported on 06/02/2023)     Calcium Citrate-Vitamin D 200-250 MG-UNIT TABS Take 1 tablet by mouth 4 (four) times daily.     Cholecalciferol  (VITAMIN D-3) 125 MCG (5000 UT) TABS Take 2 tablets by mouth daily.     estradiol  (ESTRACE  VAGINAL) 0.1 MG/GM vaginal cream 1 gram per vagina for 2 weeks each evening, then 0.5 to 1 gram per vagina 1-3 nights per week for maintenance 42.5 g 4   ibrutinib  (IMBRUVICA ) 280 MG tablet Take 1 tablet (280 mg total) by mouth daily. Take with a glass of water. 56 tablet 6   lidocaine -prilocaine  (EMLA ) cream Apply a nickel size amount to skin on top of port- cover with saran wrap at least 1 hour before access 30 g 0   Magnesium  200 MG TABS Take 2 tablets by mouth daily.     montelukast  (SINGULAIR ) 10 MG tablet TAKE 1 TABLET AT BEDTIME 90 tablet 3   oxycodone  (OXY-IR) 5 MG capsule Take 1 capsule (5 mg total) by mouth every 8 (eight) hours as needed for up to 8 doses. 8 capsule 0   rosuvastatin (CRESTOR) 5 MG tablet Take 5 mg by mouth daily.     traZODone  (DESYREL ) 100 MG tablet TAKE 1 TABLET AT BEDTIME 90 tablet 3   valACYclovir  (VALTREX ) 1000 MG tablet Take 1 tablet (1,000 mg total) by mouth daily. TAKE 1 TABLET BY MOUTH TWICE DAILY FOR 5 DAYS THEN  TAKE 1 TABLET BY MOUTH DAILY 90 tablet 1   Zinc 30 MG TABS Take 1 tablet by mouth daily.     Current Facility-Administered Medications  Medication Dose Route Frequency Provider Last Rate Last Admin   0.9 %  sodium chloride  infusion   Intravenous PRN Causey, Morna Pickle, NP        OBJECTIVE: white woman in no acute distress  Vitals:   03/18/24 1333  BP: (!) 118/51  Pulse: 66  Resp: 17  Temp: 97.9 F (36.6 C)  SpO2: 98%      Wt Readings from Last 3 Encounters:  03/18/24 125 lb 11.2 oz (57 kg)  01/10/24 124 lb 12.8 oz (56.6 kg)  06/02/23 127 lb (57.6 kg)   Body mass index is 27.2 kg/m.    ECOG FS:1 - Symptomatic but completely ambulatory  Physical Exam Constitutional:      Appearance: Normal  appearance.  Cardiovascular:     Rate and Rhythm: Normal rate and regular rhythm.     Pulses: Normal pulses.     Heart sounds: Normal heart sounds.  Pulmonary:     Effort: Pulmonary effort is normal.     Breath sounds: Normal breath sounds.  Abdominal:     General: Abdomen is flat. Bowel sounds are normal. There is no distension.     Palpations: There is no mass.  Musculoskeletal:        General: No swelling or tenderness. Normal range of motion.     Cervical back: Normal range of motion and neck supple. No rigidity.  Lymphadenopathy:     Cervical: No cervical adenopathy.  Skin:    General: Skin is warm and dry.  Neurological:     General: No focal deficit present.     Mental Status: She is alert.  Psychiatric:        Mood and Affect: Mood normal.     LAB RESULTS:  Lab Results  Component Value Date   WBC 5.0 01/10/2024   NEUTROABS 3.2 01/10/2024   HGB 14.2 01/10/2024   HCT 43.2 01/10/2024   MCV 93.9 01/10/2024   PLT 130 (L) 01/10/2024      Chemistry      Component Value Date/Time   NA 142 01/10/2024 1351   NA 140 12/31/2016 1406   K 4.1 01/10/2024 1351   K 3.6 12/31/2016 1406   CL 106 01/10/2024 1351   CL 101 12/15/2011 1322   CO2 27 01/10/2024  1351   CO2 24 12/31/2016 1406   BUN 15 01/10/2024 1351   BUN 12.8 12/31/2016 1406   CREATININE 1.02 (H) 01/10/2024 1351   CREATININE 0.86 08/06/2023 1038   CREATININE 0.9 12/31/2016 1406      Component Value Date/Time   CALCIUM 9.0 01/10/2024 1351   CALCIUM 8.8 12/31/2016 1406   ALKPHOS 90 01/10/2024 1351   ALKPHOS 71 12/31/2016 1406   AST 29 01/10/2024 1351   AST 32 08/06/2023 1038   AST 18 12/31/2016 1406   ALT 27 01/10/2024 1351   ALT 28 08/06/2023 1038   ALT 11 12/31/2016 1406   BILITOT 0.5 01/10/2024 1351   BILITOT 1.1 08/06/2023 1038   BILITOT 0.49 12/31/2016 1406      STUDIES: No results found.   ASSESSMENT: 74 y.o.  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 rigors 02/27/2014: received IV vancomycin  and levaquin  briefly, then levaquin  po (pcn allergy)  (6) bendamustine / rituximab  for 6 cycles, completed 05/04/2014, with improvement but not normalization of the absolute lymphocyte count  (7) ibrutinib  started 05/08/2014, currently at 280 mg daily. Stopped by patient in June, 2019 restarted in September, 2019, discontinued October 2020  (a) hepatitis B studies 03/06/2014 and 12/31/2016, negative  (b) ibrutinib  restarted 01/06/2019 (at 280 mg daily) in conjunction with venetoclax   (c) ibrutinib  held after 10/18/2019, now used as  needed for wart control  (  8) CT abd/pelvis 10/22/2018 shows retroperitoneal adenopathy, increased splenomegaly  (a) repeat CT scan of the neck chest abdomen and pelvis 07/02/2019, with resolution of the previously noted adenopathy and splenomegaly.  (9) started venetoclax  12/15/2018  (a) uneventful ramp-up to the 100 mg a day dose, then held at that dose due to symptoms  (b) repeat hepatitis panel 10/18/2019 again negative  (10) evusheld started march 2022, repeated Sept 2022   PLAN: Assessment & Plan Chronic lymphocytic leukemia, B-cell type Maintained on ibrutinib  for cutaneous warts. Lapse in hematologic monitoring noted. Denied constitutional symptoms. Reported intermittent dysgeusia and decreased appetite with stable weight. Regular follow-up necessary for safety and efficacy of ibrutinib . - Ordered blood work to assess disease status and monitor for ibrutinib -related adverse effects. - Scheduled follow-up visits at least three times per year while on ibrutinib . - Provided education regarding the necessity of regular monitoring and follow-up for safe ongoing therapy.  Acute sinusitis Persistent sinusitis symptoms post-allergic episode. Completed one antibiotic course, currently on second and prednisone . No fever reported. Sinus infections may require multiple antibiotic courses. - Continue current antibiotics and prednisone  as prescribed. - Continue supportive measures, including nasal spray and neti pot.  Indwelling vascular access device management Indwelling port last flushed February 2025. No longer required for IV therapy but preferred for procedural access. Discussed risks and benefits of retention versus removal. She prefers retention. - Discussed option of port removal, including outpatient procedure by interventional radiology. - If retained, advised flushing every at least 8 weeks months to prevent occlusion. - Provided education on pros and cons of retention versus  removal, including reduced need for flushes and visits if removed. - She wanted it removed, ordered IR port.  Time spent: 30 min  *Total Encounter Time as defined by the Centers for Medicare and Medicaid Services includes, in addition to the face-to-face time of a patient visit (documented in the note above) non-face-to-face time: obtaining and reviewing outside history, ordering and reviewing medications, tests or procedures, care coordination (communications with other health care professionals or caregivers) and documentation in the medical record. "

## 2024-04-11 ENCOUNTER — Other Ambulatory Visit (HOSPITAL_COMMUNITY)

## 2024-04-11 ENCOUNTER — Ambulatory Visit (HOSPITAL_COMMUNITY)

## 2024-07-15 ENCOUNTER — Inpatient Hospital Stay

## 2024-07-15 ENCOUNTER — Inpatient Hospital Stay: Admitting: Hematology and Oncology
